# Patient Record
Sex: Female | Born: 1945
Health system: Southern US, Community
[De-identification: ages and names within clinical notes are randomized; demographics above are authoritative.]

## PROBLEM LIST (undated history)

## (undated) DIAGNOSIS — G5600 Carpal tunnel syndrome, unspecified upper limb: Secondary | ICD-10-CM

## (undated) DIAGNOSIS — R42 Dizziness and giddiness: Secondary | ICD-10-CM

## (undated) DIAGNOSIS — I959 Hypotension, unspecified: Secondary | ICD-10-CM

## (undated) DIAGNOSIS — D122 Benign neoplasm of ascending colon: Secondary | ICD-10-CM

## (undated) DIAGNOSIS — C4490 Unspecified malignant neoplasm of skin, unspecified: Secondary | ICD-10-CM

## (undated) DIAGNOSIS — G43909 Migraine, unspecified, not intractable, without status migrainosus: Secondary | ICD-10-CM

## (undated) DIAGNOSIS — R569 Unspecified convulsions: Secondary | ICD-10-CM

## (undated) DIAGNOSIS — K259 Gastric ulcer, unspecified as acute or chronic, without hemorrhage or perforation: Secondary | ICD-10-CM

## (undated) DIAGNOSIS — K219 Gastro-esophageal reflux disease without esophagitis: Secondary | ICD-10-CM

## (undated) DIAGNOSIS — I219 Acute myocardial infarction, unspecified: Secondary | ICD-10-CM

## (undated) DIAGNOSIS — K754 Autoimmune hepatitis: Secondary | ICD-10-CM

## (undated) DIAGNOSIS — I495 Sick sinus syndrome: Secondary | ICD-10-CM

## (undated) DIAGNOSIS — K7460 Unspecified cirrhosis of liver: Secondary | ICD-10-CM

## (undated) DIAGNOSIS — E039 Hypothyroidism, unspecified: Secondary | ICD-10-CM

## (undated) DIAGNOSIS — H409 Unspecified glaucoma: Secondary | ICD-10-CM

## (undated) DIAGNOSIS — F419 Anxiety disorder, unspecified: Secondary | ICD-10-CM

## (undated) DIAGNOSIS — Z950 Presence of cardiac pacemaker: Secondary | ICD-10-CM

## (undated) DIAGNOSIS — D709 Neutropenia, unspecified: Secondary | ICD-10-CM

## (undated) DIAGNOSIS — M810 Age-related osteoporosis without current pathological fracture: Secondary | ICD-10-CM

## (undated) DIAGNOSIS — F319 Bipolar disorder, unspecified: Secondary | ICD-10-CM

## (undated) DIAGNOSIS — K6289 Other specified diseases of anus and rectum: Secondary | ICD-10-CM

## (undated) DIAGNOSIS — L858 Other specified epidermal thickening: Secondary | ICD-10-CM

## (undated) DIAGNOSIS — I639 Cerebral infarction, unspecified: Secondary | ICD-10-CM

## (undated) DIAGNOSIS — M130 Polyarthritis, unspecified: Secondary | ICD-10-CM

## (undated) DIAGNOSIS — R600 Localized edema: Secondary | ICD-10-CM

## (undated) DIAGNOSIS — M159 Polyosteoarthritis, unspecified: Secondary | ICD-10-CM

## (undated) DIAGNOSIS — G2119 Other drug induced secondary parkinsonism: Secondary | ICD-10-CM

## (undated) DIAGNOSIS — I455 Other specified heart block: Secondary | ICD-10-CM

## (undated) DIAGNOSIS — D696 Thrombocytopenia, unspecified: Secondary | ICD-10-CM

## (undated) DIAGNOSIS — I519 Heart disease, unspecified: Secondary | ICD-10-CM

## (undated) DIAGNOSIS — F200 Paranoid schizophrenia: Secondary | ICD-10-CM

## (undated) DIAGNOSIS — Z862 Personal history of diseases of the blood and blood-forming organs and certain disorders involving the immune mechanism: Secondary | ICD-10-CM

## (undated) DIAGNOSIS — M797 Fibromyalgia: Secondary | ICD-10-CM

## (undated) DIAGNOSIS — I8500 Esophageal varices without bleeding: Secondary | ICD-10-CM

## (undated) HISTORY — DX: Other specified heart block: I45.5

## (undated) HISTORY — DX: Autoimmune hepatitis: K75.4

## (undated) HISTORY — DX: Carpal tunnel syndrome, unspecified upper limb: G56.00

## (undated) HISTORY — PX: CHOLECYSTECTOMY: SHX55

## (undated) HISTORY — DX: Acute myocardial infarction, unspecified: I21.9

## (undated) HISTORY — DX: Gastro-esophageal reflux disease without esophagitis: K21.9

## (undated) HISTORY — DX: Hypothyroidism, unspecified: E03.9

## (undated) HISTORY — DX: Other specified epidermal thickening: L85.8

## (undated) HISTORY — DX: Fibromyalgia: M79.7

## (undated) HISTORY — PX: TUBAL LIGATION: SHX77

## (undated) HISTORY — DX: Polyarthritis, unspecified: M13.0

## (undated) HISTORY — DX: Anxiety disorder, unspecified: F41.9

## (undated) HISTORY — DX: Hypotension, unspecified: I95.9

## (undated) HISTORY — PX: REFRACTIVE SURGERY: SHX103

## (undated) HISTORY — DX: Neutropenia, unspecified: D70.9

## (undated) HISTORY — DX: Heart disease, unspecified: I51.9

## (undated) HISTORY — DX: Unspecified glaucoma: H40.9

## (undated) HISTORY — DX: Paranoid schizophrenia: F20.0

## (undated) HISTORY — DX: Presence of cardiac pacemaker: Z95.0

## (undated) HISTORY — DX: Benign neoplasm of ascending colon: D12.2

## (undated) HISTORY — DX: Dizziness and giddiness: R42

## (undated) HISTORY — DX: Other drug induced secondary parkinsonism: G21.19

---

## 1977-01-25 HISTORY — PX: PARTIAL HYSTERECTOMY: SHX80

## 1983-01-26 HISTORY — PX: KNEE SURGERY: SHX244

## 2001-01-25 DIAGNOSIS — H409 Unspecified glaucoma: Secondary | ICD-10-CM

## 2001-01-25 HISTORY — DX: Unspecified glaucoma: H40.9

## 2006-01-25 HISTORY — PX: CATARACT EXTRACTION: SUR2

## 2009-11-17 ENCOUNTER — Ambulatory Visit: Payer: Self-pay | Admitting: Gastroenterology

## 2010-07-20 ENCOUNTER — Ambulatory Visit (INDEPENDENT_AMBULATORY_CARE_PROVIDER_SITE_OTHER): Payer: Self-pay | Admitting: Gastroenterology

## 2010-07-20 DIAGNOSIS — R945 Abnormal results of liver function studies: Secondary | ICD-10-CM

## 2010-07-20 DIAGNOSIS — B199 Unspecified viral hepatitis without hepatic coma: Secondary | ICD-10-CM

## 2012-01-26 HISTORY — PX: PACEMAKER INSERTION: SHX728

## 2012-03-25 DIAGNOSIS — F200 Paranoid schizophrenia: Secondary | ICD-10-CM | POA: Insufficient documentation

## 2013-02-13 DIAGNOSIS — R569 Unspecified convulsions: Secondary | ICD-10-CM | POA: Diagnosis not present

## 2013-02-21 ENCOUNTER — Emergency Department (HOSPITAL_COMMUNITY): Payer: Medicare Other

## 2013-02-21 ENCOUNTER — Encounter (HOSPITAL_COMMUNITY): Payer: Self-pay | Admitting: Emergency Medicine

## 2013-02-21 ENCOUNTER — Emergency Department (HOSPITAL_COMMUNITY)
Admission: EM | Admit: 2013-02-21 | Discharge: 2013-02-21 | Disposition: A | Discharge status: Home / Self Care | Payer: Medicare Other | Attending: Emergency Medicine | Admitting: Emergency Medicine

## 2013-02-21 DIAGNOSIS — R5381 Other malaise: Secondary | ICD-10-CM | POA: Diagnosis not present

## 2013-02-21 DIAGNOSIS — I2510 Atherosclerotic heart disease of native coronary artery without angina pectoris: Secondary | ICD-10-CM | POA: Diagnosis not present

## 2013-02-21 DIAGNOSIS — I69998 Other sequelae following unspecified cerebrovascular disease: Secondary | ICD-10-CM | POA: Diagnosis not present

## 2013-02-21 DIAGNOSIS — R5383 Other fatigue: Secondary | ICD-10-CM | POA: Diagnosis not present

## 2013-02-21 DIAGNOSIS — R55 Syncope and collapse: Secondary | ICD-10-CM | POA: Diagnosis not present

## 2013-02-21 DIAGNOSIS — F319 Bipolar disorder, unspecified: Secondary | ICD-10-CM | POA: Insufficient documentation

## 2013-02-21 DIAGNOSIS — R404 Transient alteration of awareness: Secondary | ICD-10-CM | POA: Diagnosis not present

## 2013-02-21 DIAGNOSIS — G40909 Epilepsy, unspecified, not intractable, without status epilepticus: Secondary | ICD-10-CM | POA: Insufficient documentation

## 2013-02-21 DIAGNOSIS — Z8659 Personal history of other mental and behavioral disorders: Secondary | ICD-10-CM | POA: Insufficient documentation

## 2013-02-21 DIAGNOSIS — R42 Dizziness and giddiness: Secondary | ICD-10-CM | POA: Insufficient documentation

## 2013-02-21 DIAGNOSIS — R110 Nausea: Secondary | ICD-10-CM | POA: Diagnosis not present

## 2013-02-21 DIAGNOSIS — Z950 Presence of cardiac pacemaker: Secondary | ICD-10-CM | POA: Insufficient documentation

## 2013-02-21 HISTORY — DX: Bipolar disorder, unspecified: F31.9

## 2013-02-21 HISTORY — DX: Cerebral infarction, unspecified: I63.9

## 2013-02-21 HISTORY — DX: Unspecified convulsions: R56.9

## 2013-02-21 LAB — URINALYSIS, ROUTINE W REFLEX MICROSCOPIC
Bilirubin Urine: NEGATIVE
Glucose, UA: NEGATIVE mg/dL
Ketones, ur: NEGATIVE mg/dL
Leukocytes, UA: NEGATIVE
Nitrite: NEGATIVE
Protein, ur: NEGATIVE mg/dL
Specific Gravity, Urine: 1.008 (ref 1.005–1.030)
Urobilinogen, UA: 0.2 mg/dL (ref 0.0–1.0)
pH: 7 (ref 5.0–8.0)

## 2013-02-21 LAB — DIFFERENTIAL
Basophils Absolute: 0 10*3/uL (ref 0.0–0.1)
Basophils Relative: 0 % (ref 0–1)
Eosinophils Absolute: 0 10*3/uL (ref 0.0–0.7)
Eosinophils Relative: 2 % (ref 0–5)
Lymphocytes Relative: 28 % (ref 12–46)
Lymphs Abs: 0.5 10*3/uL — ABNORMAL LOW (ref 0.7–4.0)
Monocytes Absolute: 0.2 10*3/uL (ref 0.1–1.0)
Monocytes Relative: 13 % — ABNORMAL HIGH (ref 3–12)
Neutro Abs: 1 10*3/uL — ABNORMAL LOW (ref 1.7–7.7)
Neutrophils Relative %: 58 % (ref 43–77)

## 2013-02-21 LAB — COMPREHENSIVE METABOLIC PANEL
ALT: 15 U/L (ref 0–35)
AST: 32 U/L (ref 0–37)
Albumin: 2.7 g/dL — ABNORMAL LOW (ref 3.5–5.2)
Alkaline Phosphatase: 71 U/L (ref 39–117)
BUN: 12 mg/dL (ref 6–23)
CO2: 21 mEq/L (ref 19–32)
Calcium: 8.4 mg/dL (ref 8.4–10.5)
Chloride: 103 mEq/L (ref 96–112)
Creatinine, Ser: 0.58 mg/dL (ref 0.50–1.10)
GFR calc Af Amer: >90 mL/min (ref >90)
GFR calc non Af Amer: >90 mL/min (ref >90)
Glucose, Bld: 107 mg/dL — ABNORMAL HIGH (ref 70–99)
Potassium: 3.8 mEq/L (ref 3.7–5.3)
Sodium: 135 mEq/L — ABNORMAL LOW (ref 137–147)
Total Bilirubin: 0.4 mg/dL (ref 0.3–1.2)
Total Protein: 7.7 g/dL (ref 6.0–8.3)

## 2013-02-21 LAB — POCT I-STAT, CHEM 8
BUN: 12 mg/dL (ref 6–23)
Calcium, Ion: 1.15 mmol/L (ref 1.13–1.30)
Chloride: 106 mEq/L (ref 96–112)
Creatinine, Ser: 0.7 mg/dL (ref 0.50–1.10)
Glucose, Bld: 104 mg/dL — ABNORMAL HIGH (ref 70–99)
HCT: 40 % (ref 36.0–46.0)
Hemoglobin: 13.6 g/dL (ref 12.0–15.0)
Potassium: 3.7 mEq/L (ref 3.7–5.3)
Sodium: 140 mEq/L (ref 137–147)
TCO2: 22 mmol/L (ref 0–100)

## 2013-02-21 LAB — URINE MICROSCOPIC-ADD ON

## 2013-02-21 LAB — CBC
HCT: 36.6 % (ref 36.0–46.0)
Hemoglobin: 12.8 g/dL (ref 12.0–15.0)
MCH: 32.1 pg (ref 26.0–34.0)
MCHC: 35 g/dL (ref 30.0–36.0)
MCV: 91.7 fL (ref 78.0–100.0)
Platelets: UNDETERMINED 10*3/uL (ref 150–400)
RBC: 3.99 MIL/uL (ref 3.87–5.11)
RDW: 15.3 % (ref 11.5–15.5)
WBC: 1.7 10*3/uL — ABNORMAL LOW (ref 4.0–10.5)

## 2013-02-21 LAB — RAPID URINE DRUG SCREEN, HOSP PERFORMED
Amphetamines: NOT DETECTED
Barbiturates: NOT DETECTED
Benzodiazepines: NOT DETECTED
Cocaine: NOT DETECTED
Opiates: NOT DETECTED
Tetrahydrocannabinol: NOT DETECTED

## 2013-02-21 LAB — ETHANOL: Alcohol, Ethyl (B): <11 mg/dL (ref 0–11)

## 2013-02-21 LAB — PROTIME-INR
INR: 1.15 (ref 0.00–1.49)
Prothrombin Time: 14.5 seconds (ref 11.6–15.2)

## 2013-02-21 LAB — APTT: aPTT: 33 seconds (ref 24–37)

## 2013-02-21 LAB — POCT I-STAT TROPONIN I: Troponin i, poc: 0 ng/mL (ref 0.00–0.08)

## 2013-02-21 LAB — TROPONIN I: Troponin I: <0.3 ng/mL (ref <0.30)

## 2013-02-21 IMAGING — CT CT HEAD W/O CM
2 series · 16 of 30 positions shown, 18 images · non-contrast
Comparison: Head CT [DATE].

CLINICAL DATA: Dizziness.  Near syncope.  Weakness and hypotension.

EXAM:
CT HEAD WITHOUT CONTRAST
TECHNIQUE: Contiguous axial images were obtained from the base of the skull
through the vertex without intravenous contrast.

[Series 2: head w/o · axial · non-contrast · 0.43mm/px · z∈[+58,+182]mm · 8 of 33 slices shown, 10 images]
[im 4/33  brain]
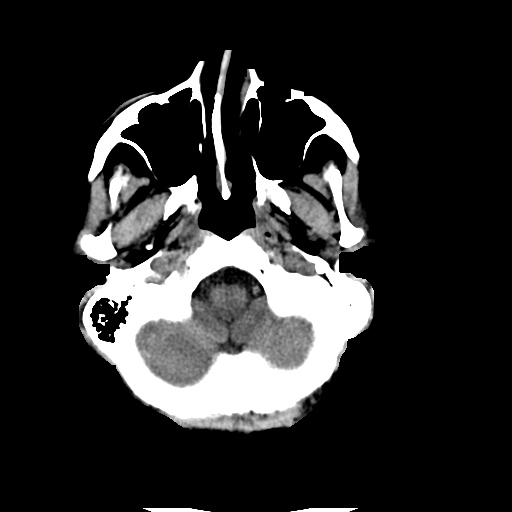
[im 4/33  bone]
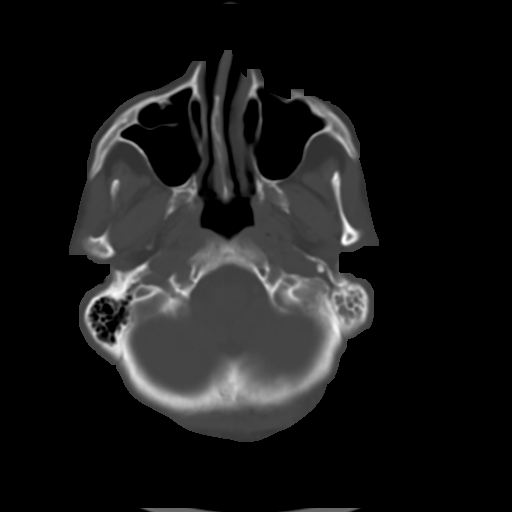
[im 8/33  brain]
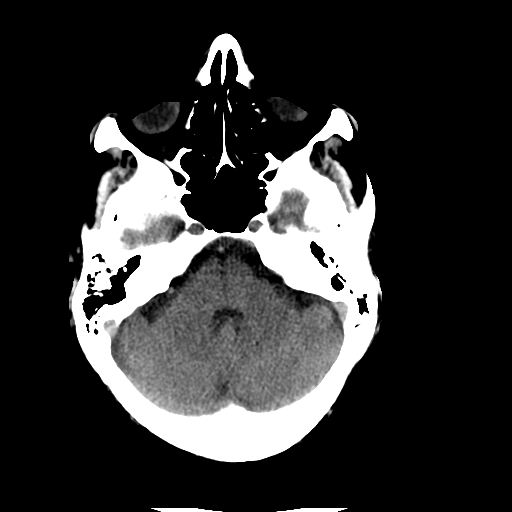
[im 11/33  brain]
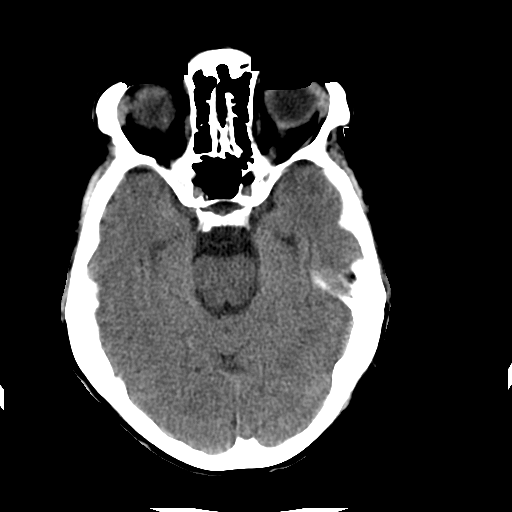
[im 15/33  brain]
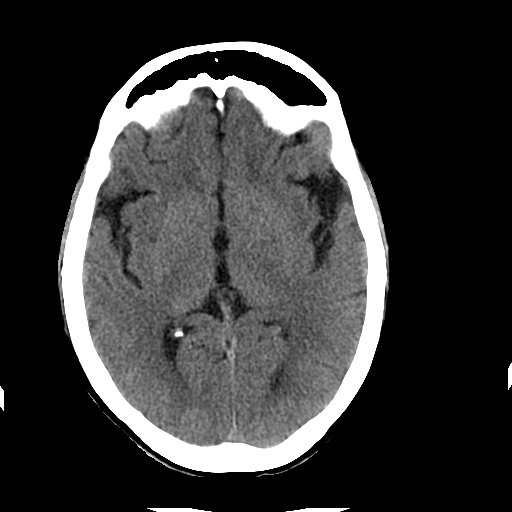
[im 18/33  brain]
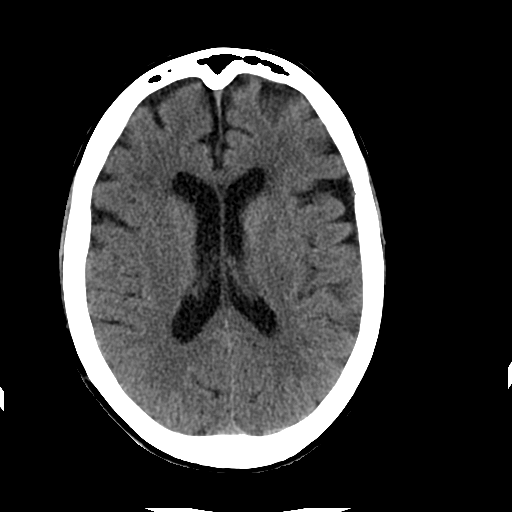
[im 18/33  bone]
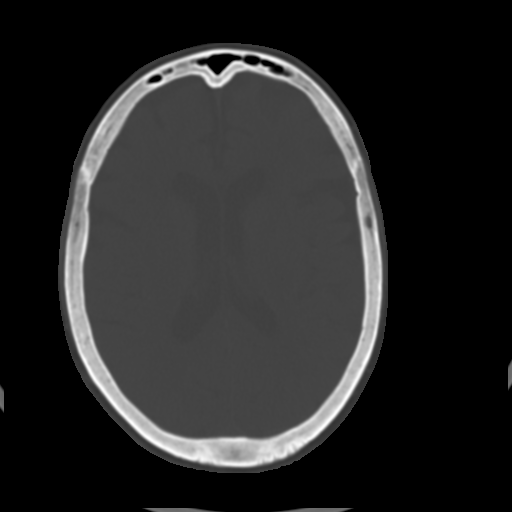
[im 22/33  brain]
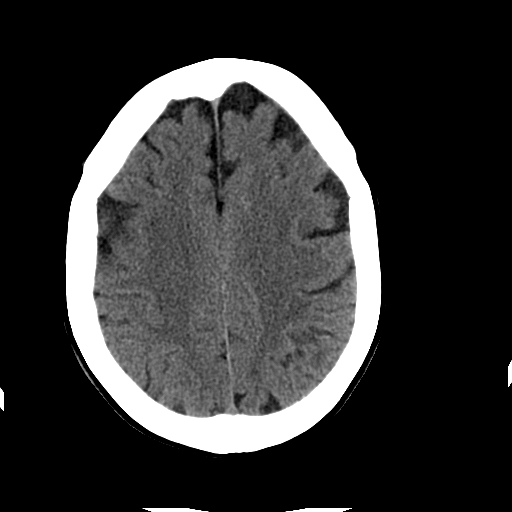
[im 25/33  brain]
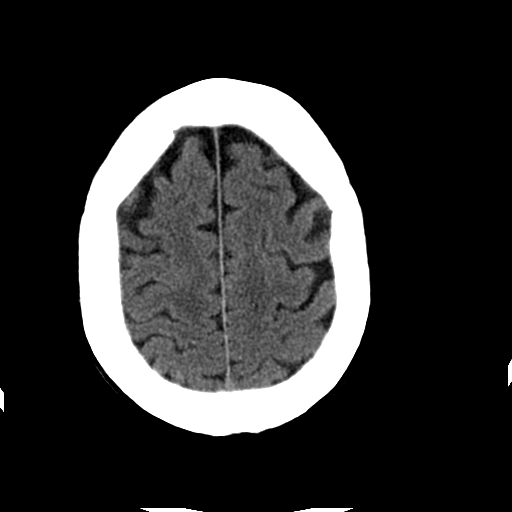
[im 29/33  brain]
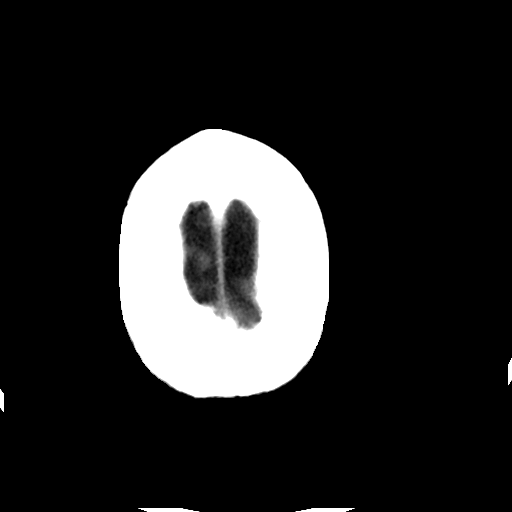

[Series 3: head w/o bone · axial · non-contrast · 0.43mm/px · z∈[+58,+185]mm · 8 of 65 slices shown]
[im 7/65  bone]
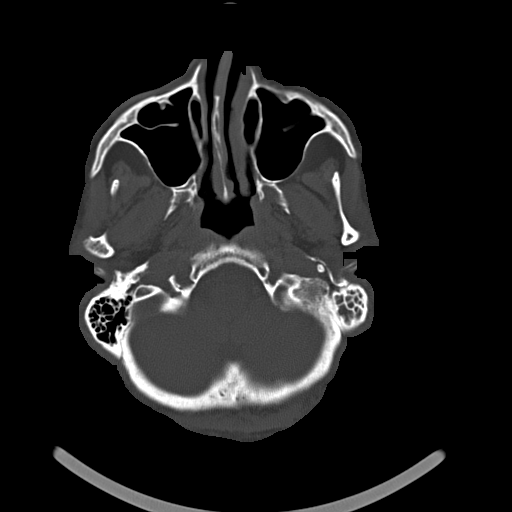
[im 14/65  bone]
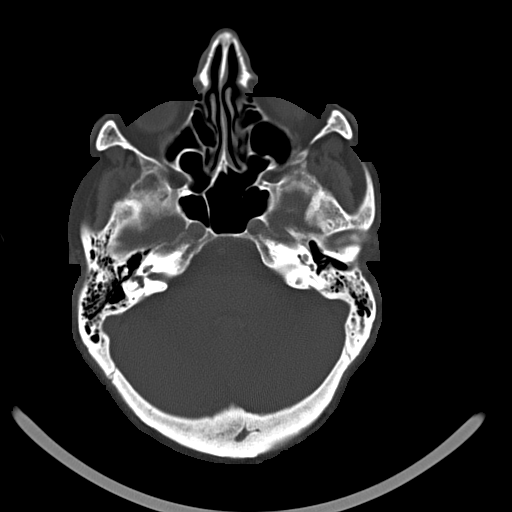
[im 21/65  bone]
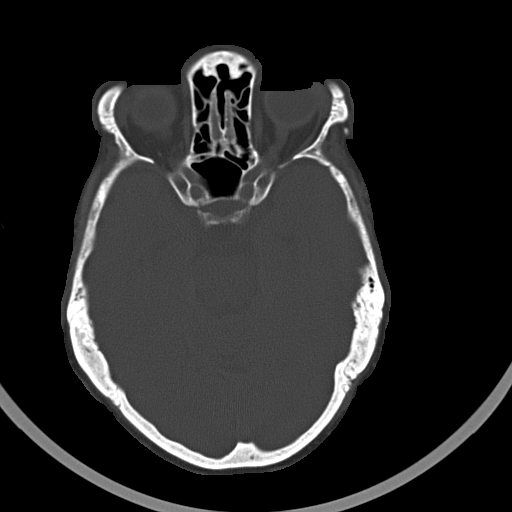
[im 27/65  bone]
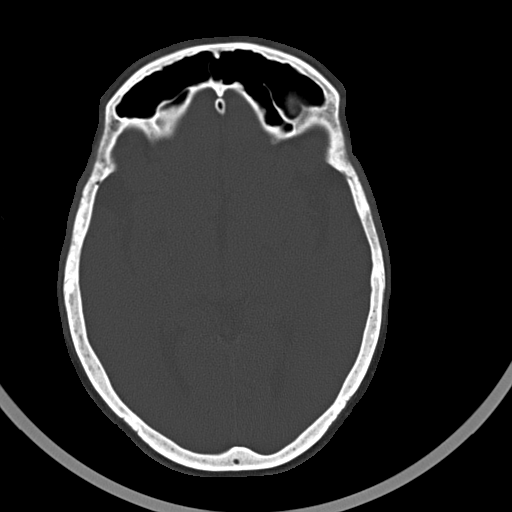
[im 38/65  bone]
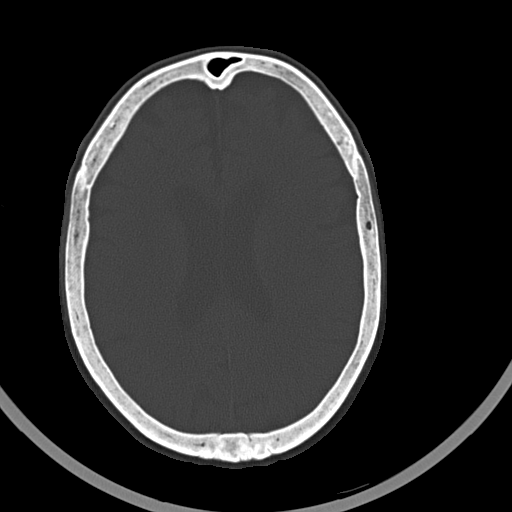
[im 44/65  bone]
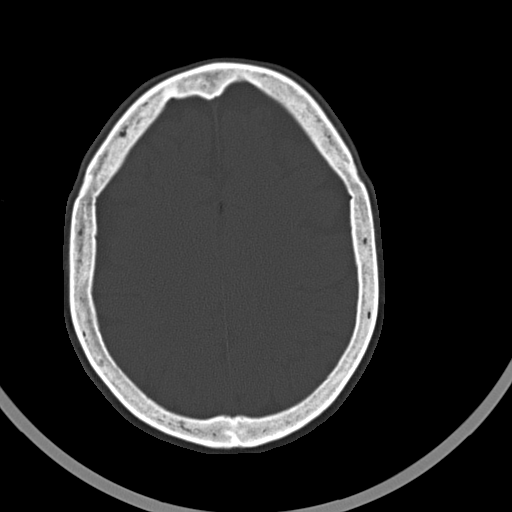
[im 51/65  bone]
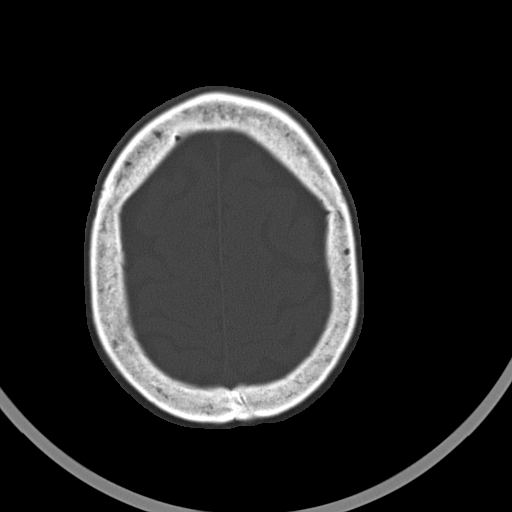
[im 58/65  bone]
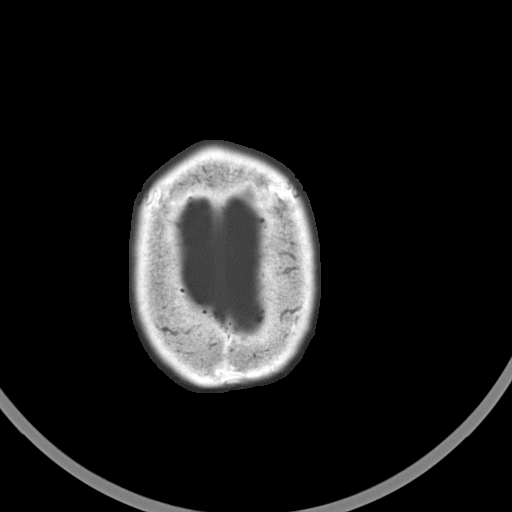

[16 of 30 positions shown; findings below may reference images not displayed]

FINDINGS: Mild cerebral atrophy. No acute intracranial abnormalities.
Specifically, no evidence of acute intracranial hemorrhage, no
definite findings of acute/subacute cerebral ischemia, no mass, mass
effect, hydrocephalus or abnormal intra or extra-axial fluid
collections. Large left mastoid effusion new compared to the prior
examination. The remainder the visualized paranasal sinuses and
mastoids are well pneumatized. No acute displaced skull fractures
are identified.
IMPRESSION: 1. New large left mastoid effusion compared to the prior
examination.
2. No acute intracranial abnormalities.
3. Mild cerebral atrophy.

## 2013-02-21 NOTE — Discharge Instructions (Signed)
Today you had a near syncopal episode. Your labwork and imaging today does not point to any one reason this happened. It is possible this is a side effect of the Imdur (isosorbide mononitrate). You may discontinue this medication at this time. It is very important that you follow up with a cardiologist. You have been referred to one in the area above. It is also very important that you return to the Emergency Department immediately if this happens again, you develop chest pain, shortness of breath, or have any symptoms that concern you.   Near-Syncope Near-syncope (commonly known as near fainting) is sudden weakness, dizziness, or feeling like you might pass out. During an episode of near-syncope, you may also develop pale skin, have tunnel vision, or feel sick to your stomach (nauseous). Near-syncope may occur when getting up after sitting or while standing for a long time. It is caused by a sudden decrease in blood flow to the brain. This decrease can result from various causes or triggers, most of which are not serious. However, because near-syncope can sometimes be a sign of something serious, a medical evaluation is required. The specific cause is often not determined. HOME CARE INSTRUCTIONS  Monitor your condition for any changes. The following actions may help to alleviate any discomfort you are experiencing:  Have someone stay with you until you feel stable.  Lie down right away if you start feeling like you might faint. Breathe deeply and steadily. Wait until all the symptoms have passed. Most of these episodes last only a few minutes. You may feel tired for several hours.   Drink enough fluids to keep your urine clear or pale yellow.   If you are taking blood pressure or heart medicine, get up slowly when seated or lying down. Take several minutes to sit and then stand. This can reduce dizziness.  Follow up with your health care provider as directed. SEEK IMMEDIATE MEDICAL CARE IF:    You have a severe headache.   You have unusual pain in the chest, abdomen, or back.   You are bleeding from the mouth or rectum, or you have black or tarry stool.   You have an irregular or very fast heartbeat.   You have repeated fainting or have seizure-like jerking during an episode.   You faint when sitting or lying down.   You have confusion.   You have difficulty walking.   You have severe weakness.   You have vision problems.  MAKE SURE YOU:   Understand these instructions.  Will watch your condition.  Will get help right away if you are not doing well or get worse. Document Released: 01/11/2005 Document Revised: 09/13/2012 Document Reviewed: 06/16/2012 [facility] Patient Information 2014 [employer].

## 2013-02-21 NOTE — ED Notes (Signed)
C/o dizziness, lightheadedness, n/v, generalized weakness & near syncopal. Denies dizziness when laying still & describes it as the room spinning. Denies fever, cold, cough, vomiting, diarrhea. States has been eating & drinking well. Denies any CP, SOB, palpitations.

## 2013-02-21 NOTE — ED Notes (Signed)
After breakfast & taking meds C/o sudden onset dizziness, nausea, weakness & near syncopal. Per EMS - pt orthostatic. BP lying 112/60 to sitting 80/60. Given 249ml NS

## 2013-02-21 NOTE — ED Notes (Signed)
Medtronic pacemaker interrogated °

## 2013-02-21 NOTE — ED Notes (Signed)
Ambulated from the bathroom to her room pt amublated without any assistance no complaint of dizziness headache or weakness

## 2013-02-21 NOTE — ED Notes (Signed)
Patient transported to CT 

## 2013-02-21 NOTE — ED Provider Notes (Signed)
CSN: [id number]     Arrival date & time 02/21/13  1030 History   First MD Initiated Contact with Patient 02/21/13 1032     Chief Complaint  Patient presents with  . Near Syncope  . Dizziness  . Weakness  . Hypotension   (Consider location/radiation/quality/duration/timing/severity/associated sxs/prior Treatment) HPI Comments: Patient is a 68 year old female with history of coronary artery disease, stroke, seizures, bipolar disorder who presents today with sudden onset of dizziness, nausea, weakness at 9 AM. She reports that she was eating breakfast and got up after taking her medication. If she felt as though the room is spinning around her. She felt generally weak and nauseous. She felt as though she was going to pass out. She reports that this feels like when she is about to have a seizure. She takes Keppra for seizures and has been taking it as prescribed. She denies any recent illness, but seems to have runny noses off and on. She denies any new medications recently. She reports that this does not feel like her prior stroke. She has residual left-sided weakness from her prior stroke, but feels that it is worse today. Her symptoms are worse when she sits up. He denies chest pain, shortness of breath, fever, chills, nausea, vomiting abdominal pain, urinary urgency, urinary frequency, dysuria. She was seen by her cardiologist earlier this month. She just moved her from [place] to [place].  The history is provided by the patient. No language interpreter was used.    No past medical history on file. No past surgical history on file. No family history on file. History  Substance Use Topics  . Smoking status: Not on file  . Smokeless tobacco: Not on file  . Alcohol Use: Not on file   OB History   No data available     Review of Systems  Constitutional: Negative for fever and chills.  HENT: Negative for ear pain.   Respiratory: Negative for shortness of breath.   Cardiovascular:  Negative for chest pain.  Gastrointestinal: Positive for nausea. Negative for vomiting and abdominal pain.  Neurological: Positive for dizziness and light-headedness. Negative for seizures and headaches.  All other systems reviewed and are negative.    Allergies  Review of patient's allergies indicates not on file.  Home Medications  No current outpatient prescriptions on file. Ht 5\' 8"  (1.727 m)  Wt 168 lb (76.204 kg)  BMI 25.55 kg/m2  SpO2 97% Physical Exam  Nursing note and vitals reviewed. Constitutional: She is oriented to person, place, and time. She appears well-developed and well-nourished. No distress.  HENT:  Head: Normocephalic and atraumatic.  Right Ear: Tympanic membrane, external ear and ear canal normal. No mastoid tenderness.  Left Ear: Tympanic membrane, external ear and ear canal normal. No mastoid tenderness.  Nose: Nose normal.  Mouth/Throat: Uvula is midline, oropharynx is clear and moist and mucous membranes are normal.  Eyes: Conjunctivae and EOM are normal. Pupils are equal, round, and reactive to light.  Neck: Normal range of motion.  Cardiovascular: Normal rate, regular rhythm, normal heart sounds, intact distal pulses and normal pulses.   Pulmonary/Chest: Effort normal and breath sounds normal. No stridor. No respiratory distress. She has no wheezes. She has no rales.  Pacemaker in place in left chest  Abdominal: Soft. She exhibits no distension. There is tenderness in the suprapubic area. There is no rigidity, no rebound and no guarding.  Musculoskeletal: Normal range of motion.  Neurological: She is alert and oriented to person, place, and time.  She has normal strength. Coordination normal.  Finger nose finger normal. Rapid alternating movements normal. No pronator drift. No facial droop.  4/5 strength on left lower extremity. Pt reports this is chronic from prior stroke.   Skin: Skin is warm and dry. She is not diaphoretic. No erythema.  Psychiatric:  She has a normal mood and affect. Her behavior is normal.    ED Course  Procedures (including critical care time) Labs Review Labs Reviewed  CBC - Abnormal; Notable for the following:    WBC 1.7 (*)    All other components within normal limits  DIFFERENTIAL - Abnormal; Notable for the following:    Neutro Abs 1.0 (*)    Lymphs Abs 0.5 (*)    Monocytes Relative 13 (*)    All other components within normal limits  COMPREHENSIVE METABOLIC PANEL - Abnormal; Notable for the following:    Sodium 135 (*)    Glucose, Bld 107 (*)    Albumin 2.7 (*)    All other components within normal limits  URINALYSIS, ROUTINE W REFLEX MICROSCOPIC - Abnormal; Notable for the following:    Hgb urine dipstick SMALL (*)    All other components within normal limits  POCT I-STAT, CHEM 8 - Abnormal; Notable for the following:    Glucose, Bld 104 (*)    All other components within normal limits  ETHANOL  PROTIME-INR  APTT  TROPONIN I  URINE RAPID DRUG SCREEN (HOSP PERFORMED)  URINE MICROSCOPIC-ADD ON  POCT I-STAT TROPONIN I   Imaging Review Ct Head Wo Contrast  02/21/2013   CLINICAL DATA:  Dizziness.  Near syncope.  Weakness and hypotension.  EXAM: CT HEAD WITHOUT CONTRAST  TECHNIQUE: Contiguous axial images were obtained from the base of the skull through the vertex without intravenous contrast.  COMPARISON:  Head CT 01/10/2006.  FINDINGS: Mild cerebral atrophy. No acute intracranial abnormalities. Specifically, no evidence of acute intracranial hemorrhage, no definite findings of acute/subacute cerebral ischemia, no mass, mass effect, hydrocephalus or abnormal intra or extra-axial fluid collections. Large left mastoid effusion new compared to the prior examination. The remainder the visualized paranasal sinuses and mastoids are well pneumatized. No acute displaced skull fractures are identified.  IMPRESSION: 1. New large left mastoid effusion compared to the prior examination. 2. No acute intracranial  abnormalities. 3. Mild cerebral atrophy.   Electronically Signed   By: Vinnie Langton M.D.   On: 02/21/2013 12:28    EKG Interpretation   None      11:26 AM Called Dr. Adela Glimpse about patient. His secretary reports he is retired. Will attempts to contact patient's PCP.   11:28 AM Sherren Mocha contacted me. He has not seen pt since June of 2014. Reviewed notes from July 2014. She has been living in [place] and had been seeing here for awhile. He reports lots of mental illness. Patient has been a poor historian for him as she has been for me. She is seen at [facility] for autoimmune hepatitis. She is seen by Dr. Monica Martinez. He does not recall any significant weakness from her prior stroke. He does report frequent delusions, paranoia. She believes electronics are controlling things in her apt. He is unsure of where she was seen for her pacemaker placement in December.   I left a message for patient's cardiologist. I did not receive a call back.   Patient ambulated with steady gait to the bathroom. Patient reports this did not elicit any further sx.   MDM   1. Near syncope  Patient feels significantly improved prior to discharge. She is able to ambulate with a steady gait to the bathroom. This does not elicit any further symptoms. Lab work and imaging is unremarkable. Patient will followup with a cardiologist. At this point the patient was injected to discontinue her Imdur. Strict return instructions to return to the emergency department immediately with any development of chest pain, shortness of breath, lightheadedness. She will follow up with a cardiologist in [place]. Dr. Jeneen Rinks evaluated this patient and agrees with plan. Vital signs stable for discharge. Patient / Family / Caregiver informed of clinical course, understand medical decision-making process, and agree with plan.     Elwyn Lade, PA-C 02/22/13 [phone]

## 2013-02-21 NOTE — ED Notes (Signed)
Did in and out cath on patient dark yellow urine in return 

## 2013-02-21 NOTE — ED Notes (Signed)
C/o dizziness upon sitting while obtaining orthostatic VS. Pt not stood up

## 2013-02-23 NOTE — ED Provider Notes (Signed)
Medical screening examination/treatment/procedure(s) were conducted as a shared visit with non-physician practitioner(s) and myself.  I personally evaluated the patient during the encounter.  EKG Interpretation    Date/Time:  Wednesday February 21 2013 10:49:16 EST Ventricular Rate:  76 PR Interval:  187 QRS Duration: 93 QT Interval:  402 QTC Calculation: 452 R Axis:   -2 Text Interpretation:  Sinus rhythm Low voltage, extremity and precordial leads Consider inferior infarct Consider anterior infarct ED PHYSICIAN INTERPRETATION AVAILABLE IN [facility] Confirmed by TEST, RECORD ([id number]) on 02/23/2013 8:44:36 AM            The patient seen examined. I reviewed the patient's history with her. I also discussed her history and presentation with Hardie Lora, PA. She is orthostatic. After IV fluid she is not. No events were found on interrogation of her pacemaker. Resting EKG shows no concerning findings. Labs are reassuring. Degrees she is appropriate for outpatient treatment. We'll hold her isosorbide. Check blood pressures at home. Cardiologist followup. Recheck if any recurrence of symptoms.  Tanna Furry, MD 02/23/13 [phone]

## 2013-03-06 DIAGNOSIS — F3110 Bipolar disorder, current episode manic without psychotic features, unspecified: Secondary | ICD-10-CM | POA: Diagnosis not present

## 2013-03-06 DIAGNOSIS — F319 Bipolar disorder, unspecified: Secondary | ICD-10-CM | POA: Diagnosis not present

## 2013-03-08 ENCOUNTER — Ambulatory Visit: Payer: Medicare Other | Admitting: Psychology

## 2013-03-16 ENCOUNTER — Encounter: Payer: Medicare Other | Admitting: Internal Medicine

## 2013-03-27 ENCOUNTER — Encounter: Payer: Self-pay | Admitting: *Deleted

## 2013-03-29 ENCOUNTER — Ambulatory Visit (INDEPENDENT_AMBULATORY_CARE_PROVIDER_SITE_OTHER): Payer: Medicare Other | Admitting: Internal Medicine

## 2013-03-29 ENCOUNTER — Encounter: Payer: Medicare Other | Admitting: Internal Medicine

## 2013-03-29 ENCOUNTER — Other Ambulatory Visit: Payer: Self-pay

## 2013-03-29 ENCOUNTER — Encounter: Payer: Self-pay | Admitting: Internal Medicine

## 2013-03-29 VITALS — BP 129/99 | HR 76 | Ht 68.5 in | Wt 173.0 lb

## 2013-03-29 DIAGNOSIS — R55 Syncope and collapse: Secondary | ICD-10-CM | POA: Diagnosis not present

## 2013-03-29 DIAGNOSIS — I959 Hypotension, unspecified: Secondary | ICD-10-CM

## 2013-03-29 DIAGNOSIS — Z950 Presence of cardiac pacemaker: Secondary | ICD-10-CM | POA: Diagnosis not present

## 2013-03-29 DIAGNOSIS — I495 Sick sinus syndrome: Secondary | ICD-10-CM

## 2013-03-29 DIAGNOSIS — I455 Other specified heart block: Secondary | ICD-10-CM

## 2013-03-29 LAB — MDC_IDC_ENUM_SESS_TYPE_INCLINIC
Battery Remaining Longevity: 134 mo
Battery Voltage: 3.04 V
Brady Statistic AP VP Percent: 0.04 %
Brady Statistic AP VS Percent: 17.82 %
Brady Statistic AS VP Percent: 1.3 %
Brady Statistic AS VS Percent: 80.84 %
Brady Statistic RA Percent Paced: 17.86 %
Brady Statistic RV Percent Paced: 1.34 %
Date Time Interrogation Session: 20150305173204
Lead Channel Impedance Value: 323 Ohm
Lead Channel Impedance Value: 399 Ohm
Lead Channel Impedance Value: 418 Ohm
Lead Channel Impedance Value: 494 Ohm
Lead Channel Pacing Threshold Amplitude: 0.5 V
Lead Channel Pacing Threshold Amplitude: 0.5 V
Lead Channel Pacing Threshold Pulse Width: 0.4 ms
Lead Channel Pacing Threshold Pulse Width: 0.4 ms
Lead Channel Sensing Intrinsic Amplitude: 2.25 mV
Lead Channel Sensing Intrinsic Amplitude: 2.375 mV
Lead Channel Sensing Intrinsic Amplitude: 4 mV
Lead Channel Sensing Intrinsic Amplitude: 5.25 mV
Lead Channel Setting Pacing Amplitude: 1.5 V
Lead Channel Setting Pacing Amplitude: 2 V
Lead Channel Setting Pacing Pulse Width: 0.4 ms
Lead Channel Setting Sensing Sensitivity: 0.6 mV
Zone Setting Detection Interval: 350 ms
Zone Setting Detection Interval: 400 ms

## 2013-03-29 NOTE — Patient Instructions (Addendum)
Your physician has recommended you make the following change in your medication:   STOP TAKING: aspirin & Isosorbide mononitrate  Your physician wants you to follow-up in: 9 months with Dr. Caryl Comes.  You will receive a reminder letter in the mail two months in advance. If you don't receive a letter, please call our office to schedule the follow-up appointment.  Please send a home monitoring transmission on June 28, 2013

## 2013-03-29 NOTE — Progress Notes (Signed)
Patient Care Team: Elizabeth Palau, MD as PCP - General (Family Medicine)   HPI  Stacey Velazquez is a 68 y.o. female Seen to establish pacemaker followup. This was implanted 12/14 for sinus arrest and syncope. It was done in [place]. We do not have records. She does not recall any specific cardiac evaluation.  She has been on nitrates and aspirin for many years; this was started by a family practitioner. She does not recall cardiac evaluation at that time either.  She lives in a group home. She has 2 daughters.   She has a history of presyncope for which she was seen at the [facility] emergency room. She was noted to be hypotensive with blood pressures in the 80s-110s   Past Medical History  Diagnosis Date  . Seizures   . Bipolar 1 disorder   . Sinus arrest   . Pacemaker Medtronic     MRI compatible  . Hypotension     Past Surgical History  Procedure Laterality Date  . Pacemaker insertion      Current Outpatient Prescriptions  Medication Sig Dispense Refill  . azaTHIOprine (IMURAN) 50 MG tablet Take 100 mg by mouth daily.      . Cholecalciferol (VITAMIN D) 1000 UNITS capsule Take 1,000 Units by mouth daily.      Marland Kitchen ketoprofen (ORUDIS) 75 MG capsule Take 75 mg by mouth 3 (three) times daily as needed (for headache).      . Lactobacillus (LACTINEX) PACK Take 1 each by mouth daily as needed (for lactose intolerance).       Marland Kitchen levETIRAcetam (KEPPRA) 250 MG tablet Take 250 mg by mouth 2 (two) times daily.      Marland Kitchen levothyroxine (SYNTHROID, LEVOTHROID) 100 MCG tablet Take 100 mcg by mouth daily before breakfast.      . risperiDONE (RISPERDAL) 3 MG tablet Take 3 mg by mouth at bedtime.       No current facility-administered medications for this visit.    Allergies  Allergen Reactions  . Ambien [Zolpidem Tartrate] Other (See Comments)    Reaction unknown  . Amitriptyline Other (See Comments)    Reaction unknown  . Ampicillin Other (See Comments)    Reaction unknown  .  Anaprox [Naproxen Sodium] Other (See Comments)    Reaction unknown  . Benadryl [Diphenhydramine Hcl (Sleep)] Other (See Comments)    Reaction unknown  . Cetirizine & Related Other (See Comments)    Reaction unknown  . Cortizone-10 [Hydrocortisone] Other (See Comments)    Reaction unknown  . Darvon [Propoxyphene] Other (See Comments)    Reaction unknown  . Diazepam Other (See Comments)    Reaction unknown  . Diflunisal Other (See Comments)    Reaction unknown  . Duloxetine Other (See Comments)    Reaction unknown  . Flexeril [Cyclobenzaprine] Other (See Comments)    Reaction unknown  . Lidocaine Other (See Comments)    Reaction unknown  . Meperidine And Related Other (See Comments)    Reaction unknown  . Metanx [L-Methylfolate-Algae-B12-B6] Other (See Comments)    Reaction unknown  . Metoclopramide Other (See Comments)    Reaction unknown  . Morphine And Related Other (See Comments)    Reaction unknown  . Nuprin [Ibuprofen] Other (See Comments)    Reaction unknown  . Oxycodone Other (See Comments)    Reaction unknown  . Penicillins Other (See Comments)    Reaction unknown  . Propoxyphene And Methadone Other (See Comments)    Reaction unknown  .  Ranitidine Other (See Comments)    Reaction unknown  . Tylenol [Acetaminophen] Other (See Comments)    Reaction unknown  . Vicodin [Hydrocodone-Acetaminophen] Other (See Comments)    Reaction unknown    Review of Systems negative except from HPI and PMH  Physical Exam BP 129/99  Pulse 76  Ht 5' 8.5" (1.74 m)  Wt 173 lb (78.472 kg)  BMI 25.92 kg/m2  SpO2 99% Well developed and well nourished in no acute distress HENT normal E scleral and icterus clear Neck Supple JVP flat; carotids brisk and full Clear to ausculation Device pocket well healed; without hematoma or erythema.  There is no tethering Regular rate and rhythm, no murmurs gallops or rub Soft with active bowel sounds No clubbing cyanosis none Edema Alert and  oriented, grossly normal motor and sensory function Skin Warm and Dry    Assessment and  Plan  Sinus node dysfunction  Pacemaker-Medtronic  Hypotension   Given her low blood pressures, we'll discontinue her Imdur or as she has no known coronary disease. We'll also stop her aspirin for the same reason. Pacemaker function is stable and device was reprogrammed to maximize longevity. She is atrially pacing about 17% of the time.

## 2013-04-30 ENCOUNTER — Telehealth: Payer: Self-pay | Admitting: Internal Medicine

## 2013-04-30 NOTE — Telephone Encounter (Signed)
Transmitter ordered 04/30/13. Pt knows it should arrive in about two weeks or less. Pt aware of first transmission on 06/28/13.

## 2013-04-30 NOTE — Telephone Encounter (Signed)
New message     When pt was here 3-5 and saw Dr Caryl Comes, the medtronic man said he would send her a box to check her device remotely.  She has not received the box.

## 2013-05-10 DIAGNOSIS — Q245 Malformation of coronary vessels: Secondary | ICD-10-CM | POA: Diagnosis not present

## 2013-05-18 DIAGNOSIS — E039 Hypothyroidism, unspecified: Secondary | ICD-10-CM | POA: Diagnosis not present

## 2013-05-18 DIAGNOSIS — Q245 Malformation of coronary vessels: Secondary | ICD-10-CM | POA: Diagnosis not present

## 2013-05-18 DIAGNOSIS — R569 Unspecified convulsions: Secondary | ICD-10-CM | POA: Diagnosis not present

## 2013-05-18 DIAGNOSIS — Z79899 Other long term (current) drug therapy: Secondary | ICD-10-CM | POA: Diagnosis not present

## 2013-05-21 DIAGNOSIS — F319 Bipolar disorder, unspecified: Secondary | ICD-10-CM | POA: Diagnosis not present

## 2013-06-19 ENCOUNTER — Telehealth: Payer: Self-pay | Admitting: Internal Medicine

## 2013-06-19 NOTE — Telephone Encounter (Signed)
CALLED PATIENT TO SCHEDULE NP APP NO ANSWER.

## 2013-06-20 ENCOUNTER — Telehealth: Payer: Self-pay | Admitting: Internal Medicine

## 2013-06-20 NOTE — Telephone Encounter (Signed)
CALLED PATIENT TO SCHEDULE NP APPT NO ANSWER OR VM WILL CALL BACK.

## 2013-06-28 ENCOUNTER — Ambulatory Visit (INDEPENDENT_AMBULATORY_CARE_PROVIDER_SITE_OTHER): Payer: Medicare Other | Admitting: *Deleted

## 2013-06-28 ENCOUNTER — Encounter: Payer: Self-pay | Admitting: Internal Medicine

## 2013-06-28 DIAGNOSIS — I495 Sick sinus syndrome: Secondary | ICD-10-CM

## 2013-06-28 LAB — MDC_IDC_ENUM_SESS_TYPE_REMOTE
Battery Remaining Longevity: 122 mo
Battery Voltage: 3.03 V
Brady Statistic AP VP Percent: 0.02 %
Brady Statistic AP VS Percent: 16.53 %
Brady Statistic AS VP Percent: 0.03 %
Brady Statistic AS VS Percent: 83.43 %
Brady Statistic RA Percent Paced: 16.54 %
Brady Statistic RV Percent Paced: 0.05 %
Date Time Interrogation Session: 20150604184355
Lead Channel Impedance Value: 323 Ohm
Lead Channel Impedance Value: 418 Ohm
Lead Channel Impedance Value: 437 Ohm
Lead Channel Impedance Value: 532 Ohm
Lead Channel Pacing Threshold Amplitude: 0.5 V
Lead Channel Pacing Threshold Amplitude: 0.625 V
Lead Channel Pacing Threshold Pulse Width: 0.4 ms
Lead Channel Pacing Threshold Pulse Width: 0.4 ms
Lead Channel Sensing Intrinsic Amplitude: 1.875 mV
Lead Channel Sensing Intrinsic Amplitude: 1.875 mV
Lead Channel Sensing Intrinsic Amplitude: 4.25 mV
Lead Channel Sensing Intrinsic Amplitude: 4.25 mV
Lead Channel Setting Pacing Amplitude: 1.5 V
Lead Channel Setting Pacing Amplitude: 2 V
Lead Channel Setting Pacing Pulse Width: 0.4 ms
Lead Channel Setting Sensing Sensitivity: 0.6 mV
Zone Setting Detection Interval: 350 ms
Zone Setting Detection Interval: 400 ms

## 2013-06-29 NOTE — Progress Notes (Signed)
Remote pacemaker transmission.   

## 2013-07-12 DIAGNOSIS — D72819 Decreased white blood cell count, unspecified: Secondary | ICD-10-CM | POA: Diagnosis not present

## 2013-07-25 ENCOUNTER — Encounter: Payer: Self-pay | Admitting: Cardiology

## 2013-08-22 DIAGNOSIS — F319 Bipolar disorder, unspecified: Secondary | ICD-10-CM | POA: Diagnosis not present

## 2013-10-02 ENCOUNTER — Telehealth: Payer: Self-pay | Admitting: Cardiology

## 2013-10-02 ENCOUNTER — Ambulatory Visit (INDEPENDENT_AMBULATORY_CARE_PROVIDER_SITE_OTHER): Payer: Medicare Other | Admitting: *Deleted

## 2013-10-02 DIAGNOSIS — I495 Sick sinus syndrome: Secondary | ICD-10-CM

## 2013-10-02 NOTE — Progress Notes (Signed)
Remote pacemaker transmission.   

## 2013-10-02 NOTE — Telephone Encounter (Signed)
Left message with caregiver to confirm remote transmission for pt.

## 2013-10-04 LAB — MDC_IDC_ENUM_SESS_TYPE_REMOTE
Battery Remaining Longevity: 114 mo
Battery Voltage: 3.02 V
Brady Statistic AP VP Percent: 0.01 %
Brady Statistic AP VS Percent: 7.53 %
Brady Statistic AS VP Percent: 0.04 %
Brady Statistic AS VS Percent: 92.42 %
Brady Statistic RA Percent Paced: 7.54 %
Brady Statistic RV Percent Paced: 0.05 %
Date Time Interrogation Session: 20150908193026
Lead Channel Impedance Value: 323 Ohm
Lead Channel Impedance Value: 437 Ohm
Lead Channel Impedance Value: 456 Ohm
Lead Channel Impedance Value: 513 Ohm
Lead Channel Pacing Threshold Amplitude: 0.375 V
Lead Channel Pacing Threshold Amplitude: 0.625 V
Lead Channel Pacing Threshold Pulse Width: 0.4 ms
Lead Channel Pacing Threshold Pulse Width: 0.4 ms
Lead Channel Sensing Intrinsic Amplitude: 1.375 mV
Lead Channel Sensing Intrinsic Amplitude: 1.375 mV
Lead Channel Sensing Intrinsic Amplitude: 3.625 mV
Lead Channel Sensing Intrinsic Amplitude: 3.625 mV
Lead Channel Setting Pacing Amplitude: 1.5 V
Lead Channel Setting Pacing Amplitude: 2 V
Lead Channel Setting Pacing Pulse Width: 0.4 ms
Lead Channel Setting Sensing Sensitivity: 0.6 mV
Zone Setting Detection Interval: 350 ms
Zone Setting Detection Interval: 400 ms

## 2013-10-24 DIAGNOSIS — Z23 Encounter for immunization: Secondary | ICD-10-CM | POA: Diagnosis not present

## 2013-10-29 ENCOUNTER — Encounter: Payer: Self-pay | Admitting: Cardiology

## 2013-10-31 ENCOUNTER — Emergency Department (HOSPITAL_COMMUNITY)
Admission: EM | Admit: 2013-10-31 | Discharge: 2013-10-31 | Disposition: A | Discharge status: Home / Self Care | Payer: Medicare Other | Attending: Emergency Medicine | Admitting: Emergency Medicine

## 2013-10-31 ENCOUNTER — Emergency Department (HOSPITAL_COMMUNITY): Payer: Medicare Other

## 2013-10-31 ENCOUNTER — Encounter (HOSPITAL_COMMUNITY): Payer: Self-pay | Admitting: Emergency Medicine

## 2013-10-31 DIAGNOSIS — Z79899 Other long term (current) drug therapy: Secondary | ICD-10-CM | POA: Insufficient documentation

## 2013-10-31 DIAGNOSIS — Z8719 Personal history of other diseases of the digestive system: Secondary | ICD-10-CM | POA: Insufficient documentation

## 2013-10-31 DIAGNOSIS — M542 Cervicalgia: Secondary | ICD-10-CM | POA: Diagnosis not present

## 2013-10-31 DIAGNOSIS — R531 Weakness: Secondary | ICD-10-CM | POA: Diagnosis not present

## 2013-10-31 DIAGNOSIS — R911 Solitary pulmonary nodule: Secondary | ICD-10-CM | POA: Diagnosis not present

## 2013-10-31 DIAGNOSIS — R42 Dizziness and giddiness: Secondary | ICD-10-CM | POA: Diagnosis not present

## 2013-10-31 DIAGNOSIS — E039 Hypothyroidism, unspecified: Secondary | ICD-10-CM | POA: Insufficient documentation

## 2013-10-31 DIAGNOSIS — R519 Headache, unspecified: Secondary | ICD-10-CM

## 2013-10-31 DIAGNOSIS — Z950 Presence of cardiac pacemaker: Secondary | ICD-10-CM | POA: Insufficient documentation

## 2013-10-31 DIAGNOSIS — R51 Headache: Secondary | ICD-10-CM | POA: Insufficient documentation

## 2013-10-31 DIAGNOSIS — I6523 Occlusion and stenosis of bilateral carotid arteries: Secondary | ICD-10-CM | POA: Diagnosis not present

## 2013-10-31 DIAGNOSIS — Z8679 Personal history of other diseases of the circulatory system: Secondary | ICD-10-CM | POA: Diagnosis not present

## 2013-10-31 DIAGNOSIS — G40909 Epilepsy, unspecified, not intractable, without status epilepticus: Secondary | ICD-10-CM | POA: Insufficient documentation

## 2013-10-31 DIAGNOSIS — Z880 Allergy status to penicillin: Secondary | ICD-10-CM | POA: Insufficient documentation

## 2013-10-31 DIAGNOSIS — F319 Bipolar disorder, unspecified: Secondary | ICD-10-CM | POA: Insufficient documentation

## 2013-10-31 LAB — CBC WITH DIFFERENTIAL/PLATELET
Basophils Absolute: 0 10*3/uL (ref 0.0–0.1)
Basophils Relative: 0 % (ref 0–1)
Eosinophils Absolute: 0 10*3/uL (ref 0.0–0.7)
Eosinophils Relative: 1 % (ref 0–5)
HCT: 38.8 % (ref 36.0–46.0)
Hemoglobin: 13.4 g/dL (ref 12.0–15.0)
Lymphocytes Relative: 27 % (ref 12–46)
Lymphs Abs: 0.6 10*3/uL — ABNORMAL LOW (ref 0.7–4.0)
MCH: 31.8 pg (ref 26.0–34.0)
MCHC: 34.5 g/dL (ref 30.0–36.0)
MCV: 91.9 fL (ref 78.0–100.0)
Monocytes Absolute: 0.2 10*3/uL (ref 0.1–1.0)
Monocytes Relative: 9 % (ref 3–12)
Neutro Abs: 1.5 10*3/uL — ABNORMAL LOW (ref 1.7–7.7)
Neutrophils Relative %: 63 % (ref 43–77)
Platelets: 124 10*3/uL — ABNORMAL LOW (ref 150–400)
RBC: 4.22 MIL/uL (ref 3.87–5.11)
RDW: 14.5 % (ref 11.5–15.5)
WBC: 2.4 10*3/uL — ABNORMAL LOW (ref 4.0–10.5)

## 2013-10-31 LAB — BASIC METABOLIC PANEL
Anion gap: 8 (ref 5–15)
BUN: 10 mg/dL (ref 6–23)
CO2: 24 mEq/L (ref 19–32)
Calcium: 8.5 mg/dL (ref 8.4–10.5)
Chloride: 103 mEq/L (ref 96–112)
Creatinine, Ser: 0.53 mg/dL (ref 0.50–1.10)
GFR calc Af Amer: >90 mL/min (ref >90)
GFR calc non Af Amer: >90 mL/min (ref >90)
Glucose, Bld: 100 mg/dL — ABNORMAL HIGH (ref 70–99)
Potassium: 4.4 mEq/L (ref 3.7–5.3)
Sodium: 135 mEq/L — ABNORMAL LOW (ref 137–147)

## 2013-10-31 LAB — SEDIMENTATION RATE: Sed Rate: 43 mm/hr — ABNORMAL HIGH (ref 0–22)

## 2013-10-31 IMAGING — CT CT ANGIO HEAD
1 series · 1 of 2 positions shown · IV contrast (omnipaque)
Comparison: CT head [DATE]

CLINICAL DATA: Headache 2 weeks. Left neck pain. Rule out
dissection. Dizziness 2 days.

EXAM:
CT ANGIOGRAPHY HEAD AND NECK
TECHNIQUE: Multidetector CT imaging of the head and neck was performed using
the standard protocol during bolus administration of intravenous
contrast. Multiplanar CT image reconstructions and MIPs were
obtained to evaluate the vascular anatomy. Carotid stenosis
measurements (when applicable) are obtained utilizing NASCET
criteria, using the distal internal carotid diameter as the
denominator.
CONTRAST:  50mL OMNIPAQUE IOHEXOL 350 MG/ML SOLN

[Series 100: scout · coronal · 0.6mm · 0.98mm/px · 1 of 2 slices shown]
[im 2/2]
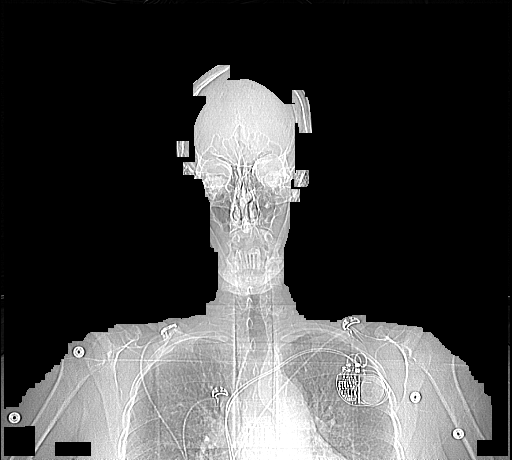

[1 of 2 positions shown; findings below may reference images not displayed]

FINDINGS: CTA HEAD FINDINGS

Mild atrophy. Chronic microvascular ischemic change in the white
matter. Low-density areas in the basal ganglia bilaterally may
represent chronic ischemia or benign cyst and are stable. Negative
for acute infarct. Negative for hemorrhage or mass. No enhancing
lesions postcontrast administration. Calvarium intact.

Left vertebral artery patent to the basilar. Left PICA patent.
Nondominant right vertebral artery. Right PICA patent. Hypoplastic
distal right vertebral artery. Basilar artery is small due to fetal
origin the posterior cerebral artery bilaterally. Basilar ends in
the superior cerebellar artery bilaterally. Posterior cerebral
arteries widely patent

Cavernous carotid widely patent bilaterally. Anterior and middle
cerebral arteries widely patent without stenosis or occlusion.

Negative for cerebral aneurysm.

Review of the MIP images confirms the above findings.

CTA NECK FINDINGS

4 mm right upper lobe lung nodule without calcification. Lung apices
are otherwise clear. No mass or adenopathy in the neck. Moderate
cervical spondylosis. Collateral veins are present in the neck due
to reflux of contrast from a left arm injection. Aortic arch and
proximal great vessels are patent.

Right carotid: Right common carotid artery widely patent. Mild
atherosclerotic disease of the carotid bifurcation with mild plaque
and calcification without significant stenosis or dissection

Left carotid: Left common carotid artery widely patent. Mild
atherosclerotic disease of the carotid bifurcation. Negative for
stenosis or dissection

Both vertebral arteries are patent. Left vertebral artery is
dominant.

Review of the MIP images confirms the above findings.
IMPRESSION: No significant intracranial stenosis

Mild atherosclerotic disease in the carotid bifurcation without
significant stenosis. Negative for carotid or vertebral artery
dissection.

4 mm right upper lobe nodule, likely benign. If the patient is at
high risk for bronchogenic carcinoma, follow-up chest CT at 1 year
is recommended. If the patient is at low risk, no follow-up is
needed. This recommendation follows the consensus statement:
Guidelines for Management of Small Pulmonary Nodules Detected on CT
Scans: A Statement from the [HOSPITAL] as published in

## 2013-10-31 IMAGING — CT CT ANGIO HEAD
1 of 14 series · 3 of 33 positions shown · IV contrast (Iohexol (Omnipaque 350))
Comparison: CT head [DATE]

CLINICAL DATA: Headache 2 weeks. Left neck pain. Rule out
dissection. Dizziness 2 days.

EXAM:
CT ANGIOGRAPHY HEAD AND NECK
TECHNIQUE: Multidetector CT imaging of the head and neck was performed using
the standard protocol during bolus administration of intravenous
contrast. Multiplanar CT image reconstructions and MIPs were
obtained to evaluate the vascular anatomy. Carotid stenosis
measurements (when applicable) are obtained utilizing NASCET
criteria, using the distal internal carotid diameter as the
denominator.
CONTRAST:  50mL OMNIPAQUE IOHEXOL 350 MG/ML SOLN

[Series 5013: mpr axial · axial · 0.44mm/px · z∈[+53,+389]mm · 3 of 337 slices shown]
[im 1/337  soft-tissue]
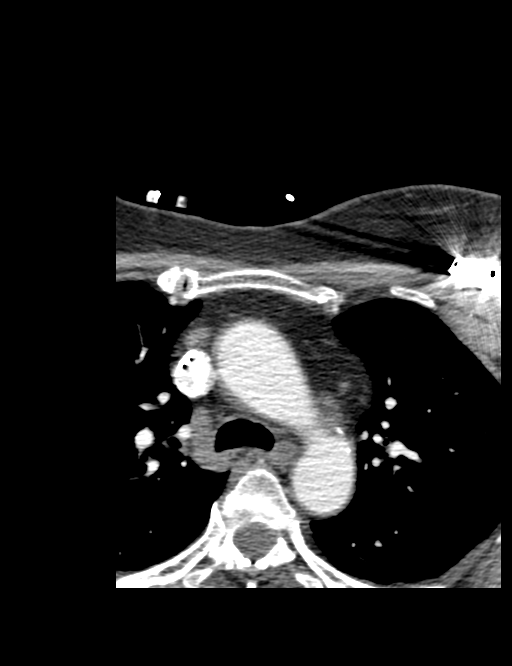
[im 169/337  bone]
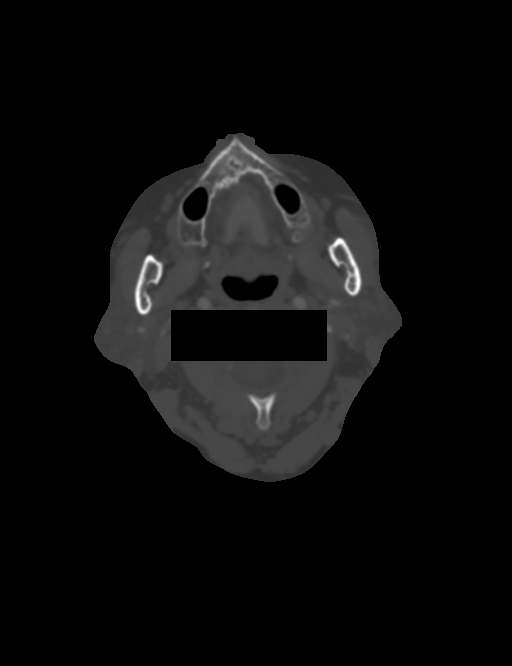
[im 337/337  soft-tissue]
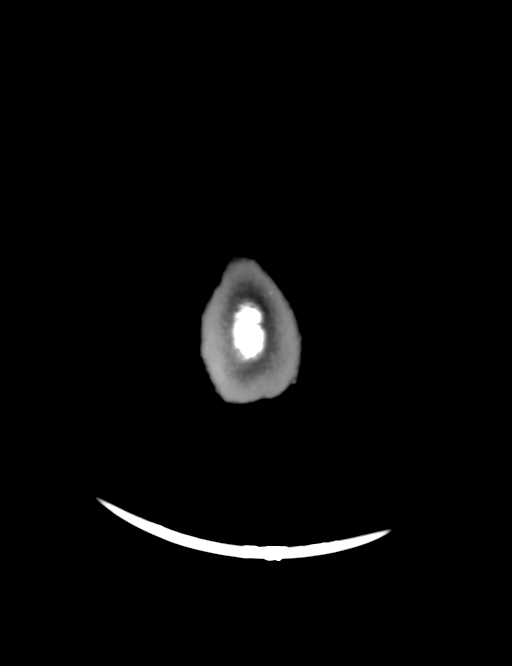

[3 of 33 positions shown; findings below may reference images not displayed]

FINDINGS: CTA HEAD FINDINGS

Mild atrophy. Chronic microvascular ischemic change in the white
matter. Low-density areas in the basal ganglia bilaterally may
represent chronic ischemia or benign cyst and are stable. Negative
for acute infarct. Negative for hemorrhage or mass. No enhancing
lesions postcontrast administration. Calvarium intact.

Left vertebral artery patent to the basilar. Left PICA patent.
Nondominant right vertebral artery. Right PICA patent. Hypoplastic
distal right vertebral artery. Basilar artery is small due to fetal
origin the posterior cerebral artery bilaterally. Basilar ends in
the superior cerebellar artery bilaterally. Posterior cerebral
arteries widely patent

Cavernous carotid widely patent bilaterally. Anterior and middle
cerebral arteries widely patent without stenosis or occlusion.

Negative for cerebral aneurysm.

Review of the MIP images confirms the above findings.

CTA NECK FINDINGS

4 mm right upper lobe lung nodule without calcification. Lung apices
are otherwise clear. No mass or adenopathy in the neck. Moderate
cervical spondylosis. Collateral veins are present in the neck due
to reflux of contrast from a left arm injection. Aortic arch and
proximal great vessels are patent.

Right carotid: Right common carotid artery widely patent. Mild
atherosclerotic disease of the carotid bifurcation with mild plaque
and calcification without significant stenosis or dissection

Left carotid: Left common carotid artery widely patent. Mild
atherosclerotic disease of the carotid bifurcation. Negative for
stenosis or dissection

Both vertebral arteries are patent. Left vertebral artery is
dominant.

Review of the MIP images confirms the above findings.
IMPRESSION: No significant intracranial stenosis

Mild atherosclerotic disease in the carotid bifurcation without
significant stenosis. Negative for carotid or vertebral artery
dissection.

4 mm right upper lobe nodule, likely benign. If the patient is at
high risk for bronchogenic carcinoma, follow-up chest CT at 1 year
is recommended. If the patient is at low risk, no follow-up is
needed. This recommendation follows the consensus statement:
Guidelines for Management of Small Pulmonary Nodules Detected on CT
Scans: A Statement from the [HOSPITAL] as published in

## 2013-10-31 MED ORDER — IOHEXOL 350 MG/ML SOLN
50.0000 mL | Freq: Once | INTRAVENOUS | Status: AC | PRN
  Administered 2013-10-31: 50 mL via INTRAVENOUS

## 2013-10-31 MED ORDER — SODIUM CHLORIDE 0.9 % IV BOLUS (SEPSIS)
500.0000 mL | Freq: Once | INTRAVENOUS | Status: AC
  Administered 2013-10-31: 500 mL via INTRAVENOUS

## 2013-10-31 MED ORDER — PROCHLORPERAZINE EDISYLATE 5 MG/ML IJ SOLN
10.0000 mg | Freq: Once | INTRAMUSCULAR | Status: DC
  Filled 2013-10-31: qty 2

## 2013-10-31 MED ORDER — MECLIZINE HCL 12.5 MG PO TABS: 12.5000 mg | ORAL_TABLET | Freq: Three times a day (TID) | ORAL | Status: DC | PRN

## 2013-10-31 NOTE — ED Notes (Signed)
Reather Converse, MD reports reviewing the [employer] report, I spoke with [name] with [employer] & was told the leads were WNL, & the report has been sent

## 2013-10-31 NOTE — ED Notes (Signed)
Social Worker at bedside.

## 2013-10-31 NOTE — Clinical Social Work Note (Addendum)
Clinical Social Worker spoke with patient at bedside regarding concerns for her current living situation.  Patient states that she is living at [facility] in [place] which is considered a group home.  Patient has been living there since January 2015 and is very unhappy.  Patient states that she has received necessary documentation from MD's to state that she is stable and capable to live independently again.  Patient states that she would like to find a senior living apartment option in [place], [place] in order to be closer to her daughter and is familiar with the area.  CSW explored a few options with patient at bedside, however patient is involved daily with Development worker, community in [place].  Patient states that she has a list of apartments already from the social worker at [employer] and has slowly been making calls, however she does not have access to a phone, internet, or transportation at the group home.  Patient is frustrated and states that she is not able to stay with her daughter temporarily and does not want to return to the group home.  Patient did verbalize understanding that she will likely have to return to the group home and continue to work on independently living options through [employer] of [place].  Patient taken to CT.  CSW to follow up again pending patient workup.  Barbette Or, [place]

## 2013-10-31 NOTE — ED Provider Notes (Addendum)
CSN: [id number]     Arrival date & time 10/31/13  0810 History   First MD Initiated Contact with Patient 10/31/13 [phone]     Chief Complaint  Patient presents with  . Dizziness  . Headache     (Consider location/radiation/quality/duration/timing/severity/associated sxs/prior Treatment) HPI Comments: 68 year old female with pacemaker requirement, autoimmune hepatitis, reflux, seizures, bipolar, multiple medicine allergies presents with headache, left neck pain, blurry vision and general viral symptoms. Patient occlusion approximately 2 weeks ago and since then has had fairly persistent gradual onset headache worse posteriorly. Patient felt it likely was viral in origin however she's concerned that has persisted for 2 weeks. Patient recently developed left neck pain sharp which she's never had before no injuries, no chiropractor use recently. No focal weakness unilateral. Patient has had a stroke in the past, no current blood thinners. Patient has vertigo symptoms for the past 2 or 3 days as well. Patient uses cane and walker normally. Possibly mild worsening gait however difficult to tell if she lies on these instruments to help her. Headache location bilateral temple, posterior head and left posterior auricular.  Patient is a 68 y.o. female presenting with dizziness and headaches. The history is provided by the patient.  Dizziness Associated symptoms: headaches   Associated symptoms: no chest pain, no shortness of breath and no vomiting   Headache Associated symptoms: dizziness (worse with position)   Associated symptoms: no abdominal pain, no back pain, no congestion, no fever, no neck pain, no neck stiffness and no vomiting     Past Medical History  Diagnosis Date  . Seizures   . Bipolar 1 disorder   . Sinus arrest   . Pacemaker [employer]     MRI compatible  . Hypotension   . Autoimmune hepatitis   . GERD (gastroesophageal reflux disease)   . Hypothyroidism    Past Surgical History   Procedure Laterality Date  . Pacemaker insertion     Family History  Problem Relation Age of Onset  . Heart attack Father   . Heart disease Paternal Uncle   . Heart disease Paternal Grandmother   . Diabetes Paternal Grandmother   . Heart disease Paternal Grandfather   . Cancer Mother     BREAST  . Diabetes Sister   . Diabetes Brother   . Cancer Maternal Grandmother   . CVA Maternal Grandmother   . Cancer Maternal Grandfather   . CVA Maternal Grandfather   . Diabetes Brother    History  Substance Use Topics  . Smoking status: Never Smoker   . Smokeless tobacco: Not on file  . Alcohol Use: No   OB History   Grav Para Term Preterm Abortions TAB SAB Ect Mult Living                 Review of Systems  Constitutional: Negative for fever and chills.  HENT: Negative for congestion.   Eyes: Negative for visual disturbance.  Respiratory: Negative for shortness of breath.   Cardiovascular: Negative for chest pain.  Gastrointestinal: Negative for vomiting and abdominal pain.  Genitourinary: Negative for dysuria and flank pain.  Musculoskeletal: Negative for back pain, neck pain and neck stiffness.  Skin: Negative for rash.  Neurological: Positive for dizziness (worse with position), weakness (general weakness) and headaches. Negative for syncope and light-headedness.      Allergies  Ambien; Amitriptyline; Ampicillin; Anaprox; Benadryl; Cetirizine & related; Cortizone-10; Darvon; Diazepam; Diflunisal; Duloxetine; Flexeril; Lidocaine; Meperidine and related; Metanx; Metoclopramide; Morphine and related; Nuprin; Oxycodone; Penicillins; Propoxyphene;  Ranitidine; Tylenol; and Vicodin  Home Medications   Prior to Admission medications   Medication Sig Start Date End Date Taking? Authorizing Provider  azaTHIOprine (IMURAN) 50 MG tablet Take 100 mg by mouth daily.    Historical Provider, MD  Cholecalciferol (VITAMIN D) 1000 UNITS capsule Take 1,000 Units by mouth daily.     Historical Provider, MD  ketoprofen (ORUDIS) 75 MG capsule Take 75 mg by mouth 3 (three) times daily as needed (for headache).    Historical Provider, MD  Lactobacillus (LACTINEX) PACK Take 1 each by mouth daily as needed (for lactose intolerance).     Historical Provider, MD  levETIRAcetam (KEPPRA) 250 MG tablet Take 250 mg by mouth 2 (two) times daily.    Historical Provider, MD  levothyroxine (SYNTHROID, LEVOTHROID) 100 MCG tablet Take 100 mcg by mouth daily before breakfast.    Historical Provider, MD  risperiDONE (RISPERDAL) 3 MG tablet Take 3 mg by mouth at bedtime.    Historical Provider, MD   SpO2 97% Physical Exam  Nursing note and vitals reviewed. Constitutional: She is oriented to person, place, and time. She appears well-developed and well-nourished.  HENT:  Head: Normocephalic and atraumatic.  Eyes: Conjunctivae are normal. Right eye exhibits no discharge. Left eye exhibits no discharge.  Neck: Normal range of motion. Neck supple. No tracheal deviation present.  Cardiovascular: Normal rate and regular rhythm.   Pulmonary/Chest: Effort normal and breath sounds normal.  Abdominal: Soft. She exhibits no distension. There is no tenderness. There is no guarding.  Musculoskeletal: She exhibits no edema.  Neck supple, no temple tenderness  Neurological: She is alert and oriented to person, place, and time. GCS eye subscore is 4. GCS verbal subscore is 5. GCS motor subscore is 6.  5+ strength in UE and LE with f/e at major joints. Sensation to palpation intact in UE and LE. CNs 2-12 grossly intact.  EOMFI.  PERRL.   Finger nose and coordination intact bilateral.   Visual fields intact to finger testing. Patient is able to take steps on her own without significant difficulty however at times needs to hold on to something which she says is at her baseline she never walks without assistance more than a few steps.  Skin: Skin is warm. No rash noted.  Psychiatric: She has a normal mood  and affect.    ED Course  Procedures (including critical care time) Labs Review Labs Reviewed  CBC WITH DIFFERENTIAL - Abnormal; Notable for the following:    WBC 2.4 (*)    Platelets 124 (*)    Neutro Abs 1.5 (*)    Lymphs Abs 0.6 (*)    All other components within normal limits  SEDIMENTATION RATE - Abnormal; Notable for the following:    Sed Rate 43 (*)    All other components within normal limits  BASIC METABOLIC PANEL - Abnormal; Notable for the following:    Sodium 135 (*)    Glucose, Bld 100 (*)    All other components within normal limits    Imaging Review Ct Angio Head W/cm &/or Wo Cm  10/31/2013   CLINICAL DATA:  Headache 2 weeks. Left neck pain. Rule out dissection. Dizziness 2 days.  EXAM: CT ANGIOGRAPHY HEAD AND NECK  TECHNIQUE: Multidetector CT imaging of the head and neck was performed using the standard protocol during bolus administration of intravenous contrast. Multiplanar CT image reconstructions and MIPs were obtained to evaluate the vascular anatomy. Carotid stenosis measurements (when applicable) are obtained utilizing NASCET criteria,  using the distal internal carotid diameter as the denominator.  CONTRAST:  38mL OMNIPAQUE IOHEXOL 350 MG/ML SOLN  COMPARISON:  CT head 02/21/2013  FINDINGS: CTA HEAD FINDINGS  Mild atrophy. Chronic microvascular ischemic change in the white matter. Low-density areas in the basal ganglia bilaterally may represent chronic ischemia or benign cyst and are stable. Negative for acute infarct. Negative for hemorrhage or mass. No enhancing lesions postcontrast administration. Calvarium intact.  Left vertebral artery patent to the basilar. Left PICA patent. Nondominant right vertebral artery. Right PICA patent. Hypoplastic distal right vertebral artery. Basilar artery is small due to fetal origin the posterior cerebral artery bilaterally. Basilar ends in the superior cerebellar artery bilaterally. Posterior cerebral arteries widely patent   Cavernous carotid widely patent bilaterally. Anterior and middle cerebral arteries widely patent without stenosis or occlusion.  Negative for cerebral aneurysm.  Review of the MIP images confirms the above findings.  CTA NECK FINDINGS  4 mm right upper lobe lung nodule without calcification. Lung apices are otherwise clear. No mass or adenopathy in the neck. Moderate cervical spondylosis. Collateral veins are present in the neck due to reflux of contrast from a left arm injection. Aortic arch and proximal great vessels are patent.  Right carotid: Right common carotid artery widely patent. Mild atherosclerotic disease of the carotid bifurcation with mild plaque and calcification without significant stenosis or dissection  Left carotid: Left common carotid artery widely patent. Mild atherosclerotic disease of the carotid bifurcation. Negative for stenosis or dissection  Both vertebral arteries are patent. Left vertebral artery is dominant.  Review of the MIP images confirms the above findings.  IMPRESSION: No significant intracranial stenosis  Mild atherosclerotic disease in the carotid bifurcation without significant stenosis. Negative for carotid or vertebral artery dissection.  4 mm right upper lobe nodule, likely benign. If the patient is at high risk for bronchogenic carcinoma, follow-up chest CT at 1 year is recommended. If the patient is at low risk, no follow-up is needed. This recommendation follows the consensus statement: Guidelines for Management of Small Pulmonary Nodules Detected on CT Scans: A Statement from the [place] as published in Radiology 2005; 237:395-400.   Electronically Signed   By: Franchot Gallo M.D.   On: 10/31/2013 11:10   Ct Angio Neck W/cm &/or Wo/cm  10/31/2013   CLINICAL DATA:  Headache 2 weeks. Left neck pain. Rule out dissection. Dizziness 2 days.  EXAM: CT ANGIOGRAPHY HEAD AND NECK  TECHNIQUE: Multidetector CT imaging of the head and neck was performed using the  standard protocol during bolus administration of intravenous contrast. Multiplanar CT image reconstructions and MIPs were obtained to evaluate the vascular anatomy. Carotid stenosis measurements (when applicable) are obtained utilizing NASCET criteria, using the distal internal carotid diameter as the denominator.  CONTRAST:  62mL OMNIPAQUE IOHEXOL 350 MG/ML SOLN  COMPARISON:  CT head 02/21/2013  FINDINGS: CTA HEAD FINDINGS  Mild atrophy. Chronic microvascular ischemic change in the white matter. Low-density areas in the basal ganglia bilaterally may represent chronic ischemia or benign cyst and are stable. Negative for acute infarct. Negative for hemorrhage or mass. No enhancing lesions postcontrast administration. Calvarium intact.  Left vertebral artery patent to the basilar. Left PICA patent. Nondominant right vertebral artery. Right PICA patent. Hypoplastic distal right vertebral artery. Basilar artery is small due to fetal origin the posterior cerebral artery bilaterally. Basilar ends in the superior cerebellar artery bilaterally. Posterior cerebral arteries widely patent  Cavernous carotid widely patent bilaterally. Anterior and middle cerebral arteries widely patent without  stenosis or occlusion.  Negative for cerebral aneurysm.  Review of the MIP images confirms the above findings.  CTA NECK FINDINGS  4 mm right upper lobe lung nodule without calcification. Lung apices are otherwise clear. No mass or adenopathy in the neck. Moderate cervical spondylosis. Collateral veins are present in the neck due to reflux of contrast from a left arm injection. Aortic arch and proximal great vessels are patent.  Right carotid: Right common carotid artery widely patent. Mild atherosclerotic disease of the carotid bifurcation with mild plaque and calcification without significant stenosis or dissection  Left carotid: Left common carotid artery widely patent. Mild atherosclerotic disease of the carotid bifurcation. Negative  for stenosis or dissection  Both vertebral arteries are patent. Left vertebral artery is dominant.  Review of the MIP images confirms the above findings.  IMPRESSION: No significant intracranial stenosis  Mild atherosclerotic disease in the carotid bifurcation without significant stenosis. Negative for carotid or vertebral artery dissection.  4 mm right upper lobe nodule, likely benign. If the patient is at high risk for bronchogenic carcinoma, follow-up chest CT at 1 year is recommended. If the patient is at low risk, no follow-up is needed. This recommendation follows the consensus statement: Guidelines for Management of Small Pulmonary Nodules Detected on CT Scans: A Statement from the [place] as published in Radiology 2005; 237:395-400.   Electronically Signed   By: Franchot Gallo M.D.   On: 10/31/2013 11:10     EKG Interpretation   Date/Time:  Wednesday October 31 2013 10:56:30 EDT Ventricular Rate:  68 PR Interval:  200 QRS Duration: 92 QT Interval:  411 QTC Calculation: 437 R Axis:   72 Text Interpretation:  Sinus rhythm Low voltage, extremity and precordial  leads similar to previous Confirmed by Jarrod Mcenery  MD, Escarlet Saathoff ([id number]) on  10/31/2013 11:19:01 AM      MDM   Final diagnoses:  Nodule of right lung  Headache, unspecified headache type  Vertigo   Patient presented from the group home with persistent headache for 2 weeks and worsening vertigo and left neck pain. With stroke history and persistent symptoms concern for possible stroke versus likely dissection. CT angina head and neck ordered, migraine treatment ordered, basic blood work pending.  Patient recheck. CT scan results reviewed small pulmonary nodule, patient nonsmoker and can followup outpatient with her primary care Dr. Patient had mild bradycardia without significant symptoms, EKG done similar to previous. Pacemaker interrogated no acute findings per report, working okay. Followup outpatient Results and  differential diagnosis were discussed with the patient/parent/guardian. Close follow up outpatient was discussed, comfortable with the plan.   Medications  prochlorperazine (COMPAZINE) injection 10 mg (not administered)  sodium chloride 0.9 % bolus 500 mL (0 mLs Intravenous Stopped 10/31/13 1114)  iohexol (OMNIPAQUE) 350 MG/ML injection 50 mL (50 mLs Intravenous Contrast Given 10/31/13 1020)    Filed Vitals:   10/31/13 0930 10/31/13 1000 10/31/13 1047 10/31/13 1100  BP: 132/77 147/95 129/75 129/76  Pulse: 69 70 41 64  Resp: 13 16 14 13   SpO2: 98% 99% 99% 97%    Final diagnoses:  Nodule of right lung  Headache, unspecified headache type  Vertigo       Mariea Clonts, MD 10/31/13 1142 and or air or is  Mariea Clonts, MD 10/31/13 1146

## 2013-10-31 NOTE — ED Notes (Signed)
Pt in from [place]. [place] via [place], pt c/o HA x 2 wks & dizziness onset x 2 days, pt A&O x4, follows commands, speaks in complete sentences, denies CP, SOB, n/v/d, per PTAR the pt is from [place], per PTAR report there are  No cognitive or developmental issues reported

## 2013-10-31 NOTE — ED Notes (Signed)
Pt. Does not want any medication at the present time.  She denies any nausea and a mild headache 1/10.  Reported to pt. That medication is available if she needs some

## 2013-10-31 NOTE — ED Notes (Signed)
PTAR reports that the pt was not aware she would be placed in a group home when moving from [place], per PTAR the pt pays $1000.00/mth & shares a room with another person & receives a jelly sandwich for lunch, Case management notified

## 2013-10-31 NOTE — ED Notes (Signed)
PATIENT CARE GIVER HAS ARRIVED TO TAKE PT HOME. PT DISCHARGED VIA [facility].

## 2013-10-31 NOTE — ED Notes (Signed)
Stacey Velazquez, Fish farm manager

## 2013-10-31 NOTE — ED Notes (Signed)
PTAR notified for pt transfer.  

## 2013-10-31 NOTE — ED Notes (Signed)
Z[place], MD at bedside 

## 2013-10-31 NOTE — ED Notes (Signed)
Per pt she is unable to get into group home until after 14:30, pt to be discharged after this time, Reather Converse, MD aware, POD C RN informed of situation, pt provided a lunch tray

## 2013-10-31 NOTE — Discharge Instructions (Signed)
If you were given medicines take as directed.  If you are on coumadin or contraceptives realize their levels and effectiveness is altered by many different medicines.  If you have any reaction (rash, tongues swelling, other) to the medicines stop taking and see a physician.   Please follow up as directed and return to the ER or see a physician for new or worsening symptoms.  Thank you. Filed Vitals:   10/31/13 0930 10/31/13 1000 10/31/13 1047 10/31/13 1100  BP: 132/77 147/95 129/75 129/76  Pulse: 69 70 41 64  Resp: 13 16 14 13   SpO2: 98% 99% 99% 97%

## 2013-11-06 ENCOUNTER — Encounter: Payer: Self-pay | Admitting: Internal Medicine

## 2013-11-08 DIAGNOSIS — R42 Dizziness and giddiness: Secondary | ICD-10-CM | POA: Diagnosis not present

## 2013-11-22 DIAGNOSIS — F319 Bipolar disorder, unspecified: Secondary | ICD-10-CM | POA: Diagnosis not present

## 2014-01-02 ENCOUNTER — Ambulatory Visit (INDEPENDENT_AMBULATORY_CARE_PROVIDER_SITE_OTHER): Payer: Medicare Other | Admitting: *Deleted

## 2014-01-02 ENCOUNTER — Encounter: Payer: Self-pay | Admitting: Internal Medicine

## 2014-01-02 ENCOUNTER — Telehealth: Payer: Self-pay | Admitting: Cardiology

## 2014-01-02 DIAGNOSIS — I495 Sick sinus syndrome: Secondary | ICD-10-CM | POA: Diagnosis not present

## 2014-01-02 LAB — MDC_IDC_ENUM_SESS_TYPE_REMOTE
Battery Remaining Longevity: 104 mo
Battery Voltage: 3.02 V
Brady Statistic AP VP Percent: 0.01 %
Brady Statistic AP VS Percent: 7.21 %
Brady Statistic AS VP Percent: 0.04 %
Brady Statistic AS VS Percent: 92.74 %
Brady Statistic RA Percent Paced: 7.22 %
Brady Statistic RV Percent Paced: 0.05 %
Date Time Interrogation Session: 20151209212357
Lead Channel Impedance Value: 323 Ohm
Lead Channel Impedance Value: 399 Ohm
Lead Channel Impedance Value: 418 Ohm
Lead Channel Impedance Value: 513 Ohm
Lead Channel Pacing Threshold Amplitude: 0.5 V
Lead Channel Pacing Threshold Amplitude: 0.625 V
Lead Channel Pacing Threshold Pulse Width: 0.4 ms
Lead Channel Pacing Threshold Pulse Width: 0.4 ms
Lead Channel Sensing Intrinsic Amplitude: 1.875 mV
Lead Channel Sensing Intrinsic Amplitude: 4 mV
Lead Channel Setting Pacing Amplitude: 1.5 V
Lead Channel Setting Pacing Amplitude: 2 V
Lead Channel Setting Pacing Pulse Width: 0.4 ms
Lead Channel Setting Sensing Sensitivity: 0.6 mV
Zone Setting Detection Interval: 350 ms
Zone Setting Detection Interval: 400 ms

## 2014-01-02 NOTE — Progress Notes (Signed)
Remote pacemaker transmission.   

## 2014-01-02 NOTE — Telephone Encounter (Signed)
Attempted to call pt and confirm remote transmission. No answer and unable to leave a message.

## 2014-01-31 ENCOUNTER — Encounter: Payer: Self-pay | Admitting: Cardiology

## 2014-02-14 DIAGNOSIS — F319 Bipolar disorder, unspecified: Secondary | ICD-10-CM | POA: Diagnosis not present

## 2014-04-02 ENCOUNTER — Ambulatory Visit (INDEPENDENT_AMBULATORY_CARE_PROVIDER_SITE_OTHER): Payer: Medicare Other | Admitting: Internal Medicine

## 2014-04-02 ENCOUNTER — Encounter: Payer: Self-pay | Admitting: Internal Medicine

## 2014-04-02 VITALS — BP 112/86 | HR 92 | Ht 69.0 in | Wt 207.0 lb

## 2014-04-02 DIAGNOSIS — Z79899 Other long term (current) drug therapy: Secondary | ICD-10-CM | POA: Diagnosis not present

## 2014-04-02 DIAGNOSIS — R55 Syncope and collapse: Secondary | ICD-10-CM

## 2014-04-02 DIAGNOSIS — I495 Sick sinus syndrome: Secondary | ICD-10-CM

## 2014-04-02 DIAGNOSIS — I455 Other specified heart block: Secondary | ICD-10-CM | POA: Diagnosis not present

## 2014-04-02 DIAGNOSIS — Z45018 Encounter for adjustment and management of other part of cardiac pacemaker: Secondary | ICD-10-CM

## 2014-04-02 LAB — MDC_IDC_ENUM_SESS_TYPE_INCLINIC
Battery Remaining Longevity: 103 mo
Battery Voltage: 3.02 V
Brady Statistic AP VP Percent: 0.01 %
Brady Statistic AP VS Percent: 8.71 %
Brady Statistic AS VP Percent: 0.04 %
Brady Statistic AS VS Percent: 91.24 %
Brady Statistic RA Percent Paced: 8.72 %
Brady Statistic RV Percent Paced: 0.05 %
Date Time Interrogation Session: 20160308164257
Lead Channel Impedance Value: 342 Ohm
Lead Channel Impedance Value: 418 Ohm
Lead Channel Impedance Value: 475 Ohm
Lead Channel Impedance Value: 551 Ohm
Lead Channel Pacing Threshold Amplitude: 0.5 V
Lead Channel Pacing Threshold Amplitude: 0.75 V
Lead Channel Pacing Threshold Pulse Width: 0.4 ms
Lead Channel Pacing Threshold Pulse Width: 0.4 ms
Lead Channel Sensing Intrinsic Amplitude: 3.375 mV
Lead Channel Sensing Intrinsic Amplitude: 6.5 mV
Lead Channel Setting Pacing Amplitude: 2 V
Lead Channel Setting Pacing Amplitude: 2.5 V
Lead Channel Setting Pacing Pulse Width: 0.4 ms
Lead Channel Setting Sensing Sensitivity: 0.6 mV
Zone Setting Detection Interval: 350 ms
Zone Setting Detection Interval: 400 ms

## 2014-04-02 MED ORDER — FUROSEMIDE 20 MG PO TABS: 20.0000 mg | ORAL_TABLET | Freq: Every day | ORAL | Status: DC

## 2014-04-02 NOTE — Progress Notes (Signed)
Patient Care Team: Elizabeth Palau, MD as PCP - General (Family Medicine)   HPI  Stacey Velazquez is a 69 y.o. female Seen to establish pacemaker followup. This was implanted 12/14 for sinus arrest and syncope. It was done in [place]. We do not have records. She does not recall any specific cardiac evaluation.  She has been on nitrates and aspirin for many years; this was stopped last visit 2/2 hypotension and no CAD  She has some sob and some edema;  Previously was on diuretics but this was stopped for reasons that she does not recall  She lives in a group home. She has 2 daughters.       Past Medical History  Diagnosis Date  . Seizures   . Bipolar 1 disorder   . Sinus arrest   . Pacemaker Medtronic     MRI compatible  . Hypotension   . Autoimmune hepatitis   . GERD (gastroesophageal reflux disease)   . Hypothyroidism     Past Surgical History  Procedure Laterality Date  . Pacemaker insertion      Current Outpatient Prescriptions  Medication Sig Dispense Refill  . azaTHIOprine (IMURAN) 50 MG tablet Take 100 mg by mouth daily.    . Cholecalciferol (VITAMIN D) 1000 UNITS capsule Take 1,000 Units by mouth daily.    Marland Kitchen ketoprofen (ORUDIS) 75 MG capsule Take 75 mg by mouth 3 (three) times daily as needed (for headache).    . Lactobacillus (LACTINEX) PACK Take 1 each by mouth daily as needed (for lactose intolerance).     Marland Kitchen levETIRAcetam (KEPPRA) 250 MG tablet Take 250 mg by mouth 2 (two) times daily.    Marland Kitchen levothyroxine (SYNTHROID, LEVOTHROID) 100 MCG tablet Take 100 mcg by mouth daily before breakfast.    . meclizine (ANTIVERT) 12.5 MG tablet Take 1 tablet (12.5 mg total) by mouth 3 (three) times daily as needed for dizziness. 10 tablet 0  . risperiDONE (RISPERDAL) 3 MG tablet Take 3 mg by mouth at bedtime.     No current facility-administered medications for this visit.    Allergies  Allergen Reactions  . Ambien [Zolpidem Tartrate] Other (See Comments)   Hallucinations   . Amitriptyline Other (See Comments)    Reaction unknown  . Ampicillin Other (See Comments)    Stops heart per pt   . Anaprox [Naproxen Sodium] Other (See Comments)    Reaction unknown  . Benadryl [Diphenhydramine Hcl (Sleep)] Other (See Comments)    Rash  . Cetirizine & Related Other (See Comments)    Swelling   . Cortizone-10 [Hydrocortisone] Other (See Comments)    seizures   . Darvon [Propoxyphene] Other (See Comments)    Increased heart rate per pt   . Diazepam Other (See Comments)    Reaction unknown  . Diflunisal Other (See Comments)    Reaction unknown  . Duloxetine Other (See Comments)    Reaction unknown  . Flexeril [Cyclobenzaprine] Other (See Comments)    Seizures   . Lidocaine Other (See Comments)    Seizures  . Meperidine And Related Other (See Comments)    Reaction unknown  . Metanx [L-Methylfolate-Algae-B12-B6] Other (See Comments)    Reaction unknown  . Metoclopramide Other (See Comments)    Reaction unknown  . Morphine And Related Other (See Comments)    Stops heart per pt   . Naproxen   . Nuprin [Ibuprofen] Other (See Comments)    Reaction unknown  . Oxycodone Other (See Comments)  Hallucinations   . Penicillins Other (See Comments)    Stops heart per pt   . Propoxyphene Other (See Comments)    Reaction unknown  . Ranitidine Other (See Comments)    Reaction unknown  . Tylenol [Acetaminophen] Other (See Comments)    Reaction unknown  . Vicodin [Hydrocodone-Acetaminophen] Other (See Comments)    Seizures     Review of Systems negative except from HPI and PMH  Physical Exam BP 112/86 mmHg  Pulse 92  Ht 5\' 9"  (1.753 m)  Wt 207 lb (93.895 kg)  BMI 30.55 kg/m2 Well developed and well nourished in no acute distress HENT normal E scleral and icterus clear Neck Supple JVP 7; carotids brisk and full Clear to ausculation Device pocket well healed; without hematoma or erythema.  There is no tethering Regular rate and  rhythm, no murmurs gallops or rub Soft with active bowel sounds No clubbing cyanosis trace Edema Alert and oriented, grossly normal motor and sensory function Skin Warm and Dry  ECG sinsu 17/08/36 Poor rwave progression LVLL  Assessment and  Plan  Sinus node dysfunction  Pacemaker-Medtronic  Volume overload-CHF  Low voltage ECG   Device function is normal with 9% A pacing  I am impressed by the low voltage on her ECG and so will do echo to look at LV function and LV wall thickness It will be important to exclude infiltrative heart disease and so we were looking for left ventricular hypertrophy  Will begin low dose diuretics and plan to check BMET in about two weeks;  Kidney function was normal 10/15

## 2014-04-02 NOTE — Patient Instructions (Signed)
Your physician has recommended you make the following change in your medication:  1) START Furosemide 20 mg daily  Your physician recommends that you return for lab work in: 2 weeks for [employer] are being referred to [place] physician has requested that you have an echocardiogram. Echocardiography is a painless test that uses sound waves to create images of your heart. It provides your doctor with information about the size and shape of your heart and how well your heart's chambers and valves are working. This procedure takes approximately one hour. There are no restrictions for this procedure.  Remote monitoring is used to monitor your Pacemaker of ICD from home. This monitoring reduces the number of office visits required to check your device to one time per year. It allows [place] to keep an eye on the functioning of your device to ensure it is working properly. You are scheduled for a device check from home on 07/02/14. You may send your transmission at any time that day. If you have a wireless device, the transmission will be sent automatically. After your physician reviews your transmission, you will receive a postcard with your next transmission date.  Your physician wants you to follow-up in: 1 year with Dr. Caryl Comes.  You will receive a reminder letter in the mail two months in advance. If you don't receive a letter, please call our office to schedule the follow-up appointment.

## 2014-04-09 ENCOUNTER — Other Ambulatory Visit: Payer: Self-pay | Admitting: *Deleted

## 2014-04-09 ENCOUNTER — Encounter: Payer: Self-pay | Admitting: Internal Medicine

## 2014-04-09 DIAGNOSIS — I959 Hypotension, unspecified: Secondary | ICD-10-CM

## 2014-04-16 ENCOUNTER — Ambulatory Visit (HOSPITAL_COMMUNITY): Payer: Medicare Other | Attending: Internal Medicine | Admitting: Radiology

## 2014-04-16 ENCOUNTER — Other Ambulatory Visit (INDEPENDENT_AMBULATORY_CARE_PROVIDER_SITE_OTHER): Payer: Medicare Other | Admitting: *Deleted

## 2014-04-16 DIAGNOSIS — Z79899 Other long term (current) drug therapy: Secondary | ICD-10-CM

## 2014-04-16 DIAGNOSIS — I495 Sick sinus syndrome: Secondary | ICD-10-CM | POA: Diagnosis not present

## 2014-04-16 DIAGNOSIS — E039 Hypothyroidism, unspecified: Secondary | ICD-10-CM | POA: Diagnosis not present

## 2014-04-16 LAB — BASIC METABOLIC PANEL
BUN: 10 mg/dL (ref 6–23)
CO2: 21 mEq/L (ref 19–32)
Calcium: 8.6 mg/dL (ref 8.4–10.5)
Chloride: 102 mEq/L (ref 96–112)
Creatinine, Ser: 0.66 mg/dL (ref 0.40–1.20)
GFR: 94.15 mL/min (ref >60.00)
Glucose, Bld: 207 mg/dL — ABNORMAL HIGH (ref 70–99)
Potassium: 3.5 mEq/L (ref 3.5–5.1)
Sodium: 129 mEq/L — ABNORMAL LOW (ref 135–145)

## 2014-04-16 NOTE — Progress Notes (Signed)
Echocardiogram performed.  

## 2014-04-16 NOTE — Addendum Note (Signed)
Addended by: Eulis Foster on: 04/16/2014 02:54 PM   Modules accepted: Orders

## 2014-04-17 ENCOUNTER — Ambulatory Visit: Payer: Medicare Other | Admitting: Internal Medicine

## 2014-04-25 ENCOUNTER — Other Ambulatory Visit: Payer: Self-pay | Admitting: *Deleted

## 2014-04-25 DIAGNOSIS — E871 Hypo-osmolality and hyponatremia: Secondary | ICD-10-CM

## 2014-05-06 ENCOUNTER — Other Ambulatory Visit: Payer: Medicare Other

## 2014-05-08 DIAGNOSIS — F312 Bipolar disorder, current episode manic severe with psychotic features: Secondary | ICD-10-CM | POA: Diagnosis not present

## 2014-05-08 DIAGNOSIS — F411 Generalized anxiety disorder: Secondary | ICD-10-CM | POA: Diagnosis not present

## 2014-05-10 ENCOUNTER — Encounter: Payer: Self-pay | Admitting: Internal Medicine

## 2014-05-10 ENCOUNTER — Ambulatory Visit: Payer: Medicare Other | Attending: Internal Medicine | Admitting: Internal Medicine

## 2014-05-10 ENCOUNTER — Other Ambulatory Visit (INDEPENDENT_AMBULATORY_CARE_PROVIDER_SITE_OTHER): Payer: Medicare Other | Admitting: *Deleted

## 2014-05-10 VITALS — BP 136/73 | HR 110 | Temp 98.6°F | Resp 20 | Ht 69.0 in | Wt 206.0 lb

## 2014-05-10 DIAGNOSIS — E038 Other specified hypothyroidism: Secondary | ICD-10-CM

## 2014-05-10 DIAGNOSIS — E039 Hypothyroidism, unspecified: Secondary | ICD-10-CM | POA: Diagnosis not present

## 2014-05-10 DIAGNOSIS — Z950 Presence of cardiac pacemaker: Secondary | ICD-10-CM

## 2014-05-10 DIAGNOSIS — E871 Hypo-osmolality and hyponatremia: Secondary | ICD-10-CM | POA: Diagnosis not present

## 2014-05-10 DIAGNOSIS — F39 Unspecified mood [affective] disorder: Secondary | ICD-10-CM

## 2014-05-10 DIAGNOSIS — H8110 Benign paroxysmal vertigo, unspecified ear: Secondary | ICD-10-CM

## 2014-05-10 DIAGNOSIS — R569 Unspecified convulsions: Secondary | ICD-10-CM

## 2014-05-10 DIAGNOSIS — K754 Autoimmune hepatitis: Secondary | ICD-10-CM | POA: Diagnosis not present

## 2014-05-10 LAB — BASIC METABOLIC PANEL
BUN: 10 mg/dL (ref 6–23)
CO2: 24 mEq/L (ref 19–32)
Calcium: 8.9 mg/dL (ref 8.4–10.5)
Chloride: 103 mEq/L (ref 96–112)
Creatinine, Ser: 0.68 mg/dL (ref 0.40–1.20)
GFR: 90.95 mL/min (ref >60.00)
Glucose, Bld: 127 mg/dL — ABNORMAL HIGH (ref 70–99)
Potassium: 3.6 mEq/L (ref 3.5–5.1)
Sodium: 131 mEq/L — ABNORMAL LOW (ref 135–145)

## 2014-05-10 LAB — TSH: TSH: 1.605 u[IU]/mL (ref 0.350–4.500)

## 2014-05-10 MED ORDER — KETOPROFEN 75 MG PO CAPS: 75.0000 mg | ORAL_CAPSULE | Freq: Three times a day (TID) | ORAL | Status: DC | PRN

## 2014-05-10 MED ORDER — AZATHIOPRINE 50 MG PO TABS: 100.0000 mg | ORAL_TABLET | Freq: Every day | ORAL | Status: DC

## 2014-05-10 MED ORDER — LEVETIRACETAM 250 MG PO TABS: 250.0000 mg | ORAL_TABLET | Freq: Two times a day (BID) | ORAL | Status: DC

## 2014-05-10 MED ORDER — MECLIZINE HCL 12.5 MG PO TABS: 12.5000 mg | ORAL_TABLET | Freq: Three times a day (TID) | ORAL | Status: DC | PRN

## 2014-05-10 MED ORDER — LEVOTHYROXINE SODIUM 100 MCG PO TABS: 100.0000 ug | ORAL_TABLET | Freq: Every day | ORAL | Status: DC

## 2014-05-10 NOTE — Progress Notes (Signed)
Patient here to establish care. Current PCP close to retirement. Needing medication refills of imuran, orudis, keppra, synthroid, antivert. Hx of autoimmune hepatitis, s/p pacemaker placement on 12/14 for sinus arrest, bipolar disorder.

## 2014-05-10 NOTE — Progress Notes (Signed)
Patient Demographics  Stacey Velazquez, is a 69 y.o. female  [id number]  [id number]  DOB - 10-13-1945  CC:  Chief Complaint  Patient presents with  . Establish Care       HPI: Stacey Velazquez is a 69 y.o. female here today to establish medical care.Patient has history of hypothyroidism,history of sinus arrest status post pacemaker, following up with cardiology, history of bipolar disorder currently following up with psychiatrist, history of autoimmune hepatitis as per patient she was seen hepatologist and has been taking Imuron which he needs refill today as she has not been able to see her specialist because of transportation issues, patient also has history of BPPV and takes meclizine when necessary. Patient currently denies any acute symptoms, as per patient she has a blood work ordered by her cardiologist and will do it today.Patient also history of seizure disorder and is taking Keppra, as per patient her last seizure was more than 1 year ago. Patient has No headache, No chest pain, No abdominal pain - No Nausea, No new weakness tingling or numbness, No Cough - SOB.  Allergies  Allergen Reactions  . Ambien [Zolpidem Tartrate] Other (See Comments)    Hallucinations   . Amitriptyline Other (See Comments)    Reaction unknown  . Ampicillin Other (See Comments)    Stops heart per pt   . Anaprox [Naproxen Sodium] Other (See Comments)    Reaction unknown  . Benadryl [Diphenhydramine Hcl (Sleep)] Other (See Comments)    Rash  . Cetirizine & Related Other (See Comments)    Swelling   . Cortizone-10 [Hydrocortisone] Other (See Comments)    seizures   . Darvon [Propoxyphene] Other (See Comments)    Increased heart rate per pt   . Diazepam Other (See Comments)    Reaction unknown  . Diflunisal Other (See Comments)    Reaction unknown  . Duloxetine Other (See Comments)    Reaction unknown  . Flexeril [Cyclobenzaprine] Other (See Comments)    Seizures   . Lidocaine  Other (See Comments)    Seizures  . Meperidine And Related Other (See Comments)    Reaction unknown  . Metanx [L-Methylfolate-Algae-B12-B6] Other (See Comments)    Reaction unknown  . Metoclopramide Other (See Comments)    Reaction unknown  . Morphine And Related Other (See Comments)    Stops heart per pt   . Naproxen   . Nuprin [Ibuprofen] Other (See Comments)    Reaction unknown  . Oxycodone Other (See Comments)    Hallucinations   . Penicillins Other (See Comments)    Stops heart per pt   . Propoxyphene Other (See Comments)    Reaction unknown  . Ranitidine Other (See Comments)    Reaction unknown  . Tylenol [Acetaminophen] Other (See Comments)    Reaction unknown  . Vicodin [Hydrocodone-Acetaminophen] Other (See Comments)    Seizures    Past Medical History  Diagnosis Date  . Seizures   . Bipolar 1 disorder   . Sinus arrest   . Pacemaker Medtronic     MRI compatible  . Hypotension   . Autoimmune hepatitis   . GERD (gastroesophageal reflux disease)   . Hypothyroidism    Current Outpatient Prescriptions on File Prior to Visit  Medication Sig Dispense Refill  . Cholecalciferol (VITAMIN D) 1000 UNITS capsule Take 1,000 Units by mouth daily.    . furosemide (LASIX) 20 MG tablet Take 1 tablet (20 mg total) by mouth daily. 90 tablet 3  .  Lactobacillus (LACTINEX) PACK Take 1 each by mouth daily as needed (for lactose intolerance).     . risperiDONE (RISPERDAL) 3 MG tablet Take 3 mg by mouth at bedtime.     No current facility-administered medications on file prior to visit.   Family History  Problem Relation Age of Onset  . Heart attack Father   . Heart disease Paternal Uncle   . Heart disease Paternal Grandmother   . Diabetes Paternal Grandmother   . Heart disease Paternal Grandfather   . Cancer Mother     BREAST  . Diabetes Sister   . Diabetes Brother   . Cancer Maternal Grandmother   . CVA Maternal Grandmother   . Cancer Maternal Grandfather   . CVA  Maternal Grandfather   . Diabetes Brother    History   Social History  . Marital Status: Married    Spouse Name: N/A  . Number of Children: N/A  . Years of Education: N/A   Occupational History  . Not on file.   Social History Main Topics  . Smoking status: Never Smoker   . Smokeless tobacco: Not on file  . Alcohol Use: No  . Drug Use: No  . Sexual Activity: Not on file   Other Topics Concern  . Not on file   Social History Narrative    Review of Systems: Constitutional: Negative for fever, chills, diaphoresis, activity change, appetite change and fatigue. HENT: Negative for ear pain, nosebleeds, congestion, facial swelling, rhinorrhea, neck pain, neck stiffness and ear discharge.  Eyes: Negative for pain, discharge, redness, itching and visual disturbance. Respiratory: Negative for cough, choking, chest tightness, shortness of breath, wheezing and stridor.  Cardiovascular: Negative for chest pain, palpitations and leg swelling. Gastrointestinal: Negative for abdominal distention. Genitourinary: Negative for dysuria, urgency, frequency, hematuria, flank pain, decreased urine volume, difficulty urinating and dyspareunia.  Musculoskeletal: Negative for back pain, joint swelling, arthralgia and gait problem. Neurological: Negative for dizziness, tremors, seizures, syncope, facial asymmetry, speech difficulty, weakness, light-headedness, numbness and headaches.  Hematological: Negative for adenopathy. Does not bruise/bleed easily. Psychiatric/Behavioral: Negative for hallucinations, behavioral problems, confusion, dysphoric mood, decreased concentration and agitation.    Objective:   Filed Vitals:   05/10/14 1041  BP: 136/73  Pulse: 110  Temp: 98.6 F (37 C)  Resp: 20    Physical Exam: Constitutional: Patient appears well-developed and well-nourished. No distress. HENT: Normocephalic, atraumatic, External right and left ear normal. Oropharynx is clear and moist.    Eyes: Conjunctivae and EOM are normal. PERRLA, no scleral icterus. Neck: Normal ROM. Neck supple. No JVD. No tracheal deviation. No thyromegaly. CVS: RRR, S1/S2 +, no murmurs, no gallops, no carotid bruit. S/c palpable pacemaker Pulmonary: Effort and breath sounds normal, no stridor, rhonchi, wheezes, rales.  Abdominal: Soft. BS +, no distension, tenderness, rebound or guarding.  Musculoskeletal: Normal range of motion. No edema and no tenderness.  Neuro: Alert. Normal reflexes, muscle tone coordination. No cranial nerve deficit. Skin: Skin is warm and dry. No rash noted. Not diaphoretic. No erythema. No pallor. Psychiatric: Normal mood and affect. Behavior, judgment, thought content normal.  Lab Results  Component Value Date   WBC 2.4* 10/31/2013   HGB 13.4 10/31/2013   HCT 38.8 10/31/2013   MCV 91.9 10/31/2013   PLT 124* 10/31/2013   Lab Results  Component Value Date   CREATININE 0.66 04/16/2014   BUN 10 04/16/2014   NA 129* 04/16/2014   K 3.5 04/16/2014   CL 102 04/16/2014   CO2 21 04/16/2014  No results found for: HGBA1C Lipid Panel  No results found for: CHOL, TRIG, HDL, CHOLHDL, VLDL, LDLCALC     Assessment and plan:   1. Other specified hypothyroidism Currently patient is on levothyroxine 100 mcg daily, recheck TSH level. - levothyroxine (SYNTHROID, LEVOTHROID) 100 MCG tablet; Take 1 tablet (100 mcg total) by mouth daily before breakfast.  Dispense: 30 tablet; Refill: 3 - TSH  2. Mood disorder Patient is on Seroquel and being followed up by her psychiatrist.  3. Pacemaker Medtronic As per patient she is taking Lasix prescribed by her cardiologist and is scheduled to do blood work today.  4. Autoimmune hepatitis I advised patient to follow up her with her specialist, she is given refill on her medication  - azaTHIOprine (IMURAN) 50 MG tablet; Take 2 tablets (100 mg total) by mouth daily.  Dispense: 60 tablet; Refill: 3  5. Convulsions, unspecified  convulsion type  - levETIRAcetam (KEPPRA) 250 MG tablet; Take 1 tablet (250 mg total) by mouth 2 (two) times daily.  Dispense: 30 tablet; Refill: 3  6. BPPV (benign paroxysmal positional vertigo), unspecified laterality  - meclizine (ANTIVERT) 12.5 MG tablet; Take 1 tablet (12.5 mg total) by mouth 3 (three) times daily as needed for dizziness.  Dispense: 30 tablet; Refill: 2    Return in about 3 months (around 08/09/2014), or if symptoms worsen or fail to improve, for hypothyroid.    The patient was given clear instructions to go to ER or return to medical center if symptoms don't improve, worsen or new problems develop. The patient verbalized understanding. The patient was told to call to get lab results if they haven't heard anything in the next week.    This note has been created with Surveyor, quantity. Any transcriptional errors are unintentional.   Lorayne Marek, MD

## 2014-05-15 ENCOUNTER — Telehealth: Payer: Self-pay

## 2014-05-15 NOTE — Telephone Encounter (Signed)
Patient not available Unable to leave message No machine for phone line

## 2014-05-15 NOTE — Telephone Encounter (Signed)
-----   Message from Stacey Marek, MD sent at 05/14/2014  9:17 AM EDT ----- Call and let the patient know that her TSH level is in normal range, patient to continue with current dose of levothyroxine.

## 2014-05-23 ENCOUNTER — Encounter: Payer: Self-pay | Admitting: *Deleted

## 2014-05-23 DIAGNOSIS — F411 Generalized anxiety disorder: Secondary | ICD-10-CM | POA: Diagnosis not present

## 2014-05-23 DIAGNOSIS — F312 Bipolar disorder, current episode manic severe with psychotic features: Secondary | ICD-10-CM | POA: Diagnosis not present

## 2014-05-23 DIAGNOSIS — F319 Bipolar disorder, unspecified: Secondary | ICD-10-CM | POA: Diagnosis not present

## 2014-06-03 ENCOUNTER — Telehealth: Payer: Self-pay | Admitting: Internal Medicine

## 2014-06-03 NOTE — Telephone Encounter (Signed)
Patient called to request a refill for meclizine (ANTIVERT) 12.5 MG tablet, she stated that the script was lost and needs more medication. Please f/u with pt.

## 2014-06-04 ENCOUNTER — Other Ambulatory Visit: Payer: Self-pay | Admitting: *Deleted

## 2014-06-04 DIAGNOSIS — H8110 Benign paroxysmal vertigo, unspecified ear: Secondary | ICD-10-CM

## 2014-06-04 MED ORDER — MECLIZINE HCL 12.5 MG PO TABS
12.5000 mg | ORAL_TABLET | Freq: Three times a day (TID) | ORAL | Status: DC | PRN
Start: 2014-06-04 — End: 2014-08-02

## 2014-06-04 NOTE — Telephone Encounter (Signed)
Patient requesting 90 day supply on her Meclizine-sent to [facility] on [address]

## 2014-06-11 NOTE — Addendum Note (Signed)
Addended by: Stanton Kidney on: 06/11/2014 10:29 AM   Modules accepted: Level of Service, SmartSet

## 2014-06-11 NOTE — Progress Notes (Signed)
This encounter was created in error - please disregard.

## 2014-07-02 ENCOUNTER — Ambulatory Visit (INDEPENDENT_AMBULATORY_CARE_PROVIDER_SITE_OTHER): Payer: Medicare Other | Admitting: *Deleted

## 2014-07-02 DIAGNOSIS — I495 Sick sinus syndrome: Secondary | ICD-10-CM | POA: Diagnosis not present

## 2014-07-02 NOTE — Progress Notes (Signed)
Remote pacemaker transmission.   

## 2014-07-05 LAB — CUP PACEART REMOTE DEVICE CHECK
Battery Remaining Longevity: 101 mo
Battery Voltage: 3.02 V
Brady Statistic AP VP Percent: 0.01 %
Brady Statistic AP VS Percent: 11.1 %
Brady Statistic AS VP Percent: 0.07 %
Brady Statistic AS VS Percent: 88.82 %
Brady Statistic RA Percent Paced: 11.11 %
Brady Statistic RV Percent Paced: 0.08 %
Date Time Interrogation Session: 20160607190000
Lead Channel Impedance Value: 342 Ohm
Lead Channel Impedance Value: 399 Ohm
Lead Channel Impedance Value: 475 Ohm
Lead Channel Impedance Value: 532 Ohm
Lead Channel Pacing Threshold Amplitude: 0.375 V
Lead Channel Pacing Threshold Amplitude: 0.75 V
Lead Channel Pacing Threshold Pulse Width: 0.4 ms
Lead Channel Pacing Threshold Pulse Width: 0.4 ms
Lead Channel Sensing Intrinsic Amplitude: 1.75 mV
Lead Channel Sensing Intrinsic Amplitude: 1.75 mV
Lead Channel Sensing Intrinsic Amplitude: 4.5 mV
Lead Channel Sensing Intrinsic Amplitude: 4.5 mV
Lead Channel Setting Pacing Amplitude: 2 V
Lead Channel Setting Pacing Amplitude: 2.5 V
Lead Channel Setting Pacing Pulse Width: 0.4 ms
Lead Channel Setting Sensing Sensitivity: 0.6 mV
Zone Setting Detection Interval: 350 ms
Zone Setting Detection Interval: 400 ms

## 2014-07-12 NOTE — Addendum Note (Signed)
Addended by: Stanton Kidney on: 07/12/2014 10:24 AM   Modules accepted: Level of Service, SmartSet

## 2014-07-12 NOTE — Progress Notes (Signed)
This encounter was created in error - please disregard.

## 2014-07-30 ENCOUNTER — Encounter: Payer: Self-pay | Admitting: Internal Medicine

## 2014-08-02 ENCOUNTER — Encounter: Payer: Self-pay | Admitting: Internal Medicine

## 2014-08-02 ENCOUNTER — Ambulatory Visit: Payer: Medicare Other | Attending: Internal Medicine | Admitting: Internal Medicine

## 2014-08-02 VITALS — BP 106/75 | HR 95 | Temp 97.8°F | Resp 18 | Ht 69.0 in | Wt 207.6 lb

## 2014-08-02 DIAGNOSIS — K754 Autoimmune hepatitis: Secondary | ICD-10-CM

## 2014-08-02 DIAGNOSIS — H8110 Benign paroxysmal vertigo, unspecified ear: Secondary | ICD-10-CM | POA: Diagnosis not present

## 2014-08-02 DIAGNOSIS — R569 Unspecified convulsions: Secondary | ICD-10-CM

## 2014-08-02 DIAGNOSIS — E038 Other specified hypothyroidism: Secondary | ICD-10-CM

## 2014-08-02 MED ORDER — LEVETIRACETAM 250 MG PO TABS: 250.0000 mg | ORAL_TABLET | Freq: Two times a day (BID) | ORAL | Status: DC

## 2014-08-02 MED ORDER — AZATHIOPRINE 50 MG PO TABS: 100.0000 mg | ORAL_TABLET | Freq: Every day | ORAL | Status: DC

## 2014-08-02 MED ORDER — LEVOTHYROXINE SODIUM 100 MCG PO TABS: 100.0000 ug | ORAL_TABLET | Freq: Every day | ORAL | Status: DC

## 2014-08-02 MED ORDER — MECLIZINE HCL 12.5 MG PO TABS: 12.5000 mg | ORAL_TABLET | Freq: Three times a day (TID) | ORAL | Status: DC | PRN

## 2014-08-02 NOTE — Progress Notes (Signed)
MRN: [id number] Name: DEJANAE NEYRA  Sex: female Age: 69 y.o. DOB: 1946/01/25  Allergies: Ambien; Amitriptyline; Ampicillin; Anaprox; Benadryl; Cetirizine & related; Cortizone-10; Darvon; Diazepam; Diflunisal; Duloxetine; Flexeril; Lidocaine; Meperidine and related; Metanx; Metoclopramide; Morphine and related; Naproxen; Nuprin; Oxycodone; Penicillins; Propoxyphene; Ranitidine; Tylenol; and Vicodin  Chief Complaint  Patient presents with  . Medication Refill    HPI: Patient is 69 y.o. female who has history of hypothyroidism, bipolar disorder, autoimmune hepatitis, cardiac pacemaker following up with cardiology, history of chronic BPPV and is taking meclizine when necessary, today she's requesting refill on her medications history of seizures currently on Keppra as per patient for the last few days she ran out of her Keppra and denies any recent seizures. In the past she was also following up with hepatologist in [facility] in the past and has been on Imuran. Patient denies any acute symptoms has been compliant in taking her meds.   Past Medical History  Diagnosis Date  . Seizures   . Bipolar 1 disorder   . Sinus arrest   . Pacemaker Medtronic     MRI compatible  . Hypotension   . Autoimmune hepatitis   . GERD (gastroesophageal reflux disease)   . Hypothyroidism     Past Surgical History  Procedure Laterality Date  . Pacemaker insertion        Medication List       This list is accurate as of: 08/02/14 11:50 AM.  Always use your most recent med list.               azaTHIOprine 50 MG tablet  Commonly known as:  IMURAN  Take 2 tablets (100 mg total) by mouth daily.     furosemide 20 MG tablet  Commonly known as:  LASIX  Take 1 tablet (20 mg total) by mouth daily.     ketoprofen 75 MG capsule  Commonly known as:  ORUDIS  Take 1 capsule (75 mg total) by mouth 3 (three) times daily as needed (for headache).     LACTINEX Pack  Take 1 each by mouth daily as needed  (for lactose intolerance).     levETIRAcetam 250 MG tablet  Commonly known as:  KEPPRA  Take 1 tablet (250 mg total) by mouth 2 (two) times daily.     levothyroxine 100 MCG tablet  Commonly known as:  SYNTHROID, LEVOTHROID  Take 1 tablet (100 mcg total) by mouth daily before breakfast.     meclizine 12.5 MG tablet  Commonly known as:  ANTIVERT  Take 1 tablet (12.5 mg total) by mouth 3 (three) times daily as needed for dizziness.     risperiDONE 3 MG tablet  Commonly known as:  RISPERDAL  Take 3 mg by mouth at bedtime.     Vitamin D 1000 UNITS capsule  Take 1,000 Units by mouth daily.        Meds ordered this encounter  Medications  . levETIRAcetam (KEPPRA) 250 MG tablet    Sig: Take 1 tablet (250 mg total) by mouth 2 (two) times daily.    Dispense:  60 tablet    Refill:  4  . azaTHIOprine (IMURAN) 50 MG tablet    Sig: Take 2 tablets (100 mg total) by mouth daily.    Dispense:  60 tablet    Refill:  4  . levothyroxine (SYNTHROID, LEVOTHROID) 100 MCG tablet    Sig: Take 1 tablet (100 mcg total) by mouth daily before breakfast.    Dispense:  30 tablet    Refill:  4  . meclizine (ANTIVERT) 12.5 MG tablet    Sig: Take 1 tablet (12.5 mg total) by mouth 3 (three) times daily as needed for dizziness.    Dispense:  90 tablet    Refill:  2     There is no immunization history on file for this patient.  Family History  Problem Relation Age of Onset  . Heart attack Father   . Heart disease Paternal Uncle   . Heart disease Paternal Grandmother   . Diabetes Paternal Grandmother   . Heart disease Paternal Grandfather   . Cancer Mother     BREAST  . Diabetes Sister   . Diabetes Brother   . Cancer Maternal Grandmother   . CVA Maternal Grandmother   . Cancer Maternal Grandfather   . CVA Maternal Grandfather   . Diabetes Brother     History  Substance Use Topics  . Smoking status: Never Smoker   . Smokeless tobacco: Not on file  . Alcohol Use: No    Review of  Systems   As noted in HPI  Filed Vitals:   08/02/14 0936  BP: 106/75  Pulse: 95  Temp: 97.8 F (36.6 C)  Resp: 18    Physical Exam  Physical Exam  Constitutional: No distress.  Eyes: EOM are normal. Pupils are equal, round, and reactive to light.  Cardiovascular: Normal rate and regular rhythm.   Pulmonary/Chest: No respiratory distress. She has no wheezes. She has no rales.  Musculoskeletal: She exhibits no edema.    CBC    Component Value Date/Time   WBC 2.4* 10/31/2013 0900   RBC 4.22 10/31/2013 0900   HGB 13.4 10/31/2013 0900   HCT 38.8 10/31/2013 0900   PLT 124* 10/31/2013 0900   MCV 91.9 10/31/2013 0900   LYMPHSABS 0.6* 10/31/2013 0900   MONOABS 0.2 10/31/2013 0900   EOSABS 0.0 10/31/2013 0900   BASOSABS 0.0 10/31/2013 0900    CMP     Component Value Date/Time   NA 131* 05/10/2014 1521   K 3.6 05/10/2014 1521   CL 103 05/10/2014 1521   CO2 24 05/10/2014 1521   GLUCOSE 127* 05/10/2014 1521   BUN 10 05/10/2014 1521   CREATININE 0.68 05/10/2014 1521   CALCIUM 8.9 05/10/2014 1521   PROT 7.7 02/21/2013 1131   ALBUMIN 2.7* 02/21/2013 1131   AST 32 02/21/2013 1131   ALT 15 02/21/2013 1131   ALKPHOS 71 02/21/2013 1131   BILITOT 0.4 02/21/2013 1131   GFRNONAA >90 10/31/2013 0900   GFRAA >90 10/31/2013 0900    No results found for: CHOL  No results found for: HGBA1C  Lab Results  Component Value Date/Time   AST 32 02/21/2013 11:31 AM    Assessment and Plan  Other specified hypothyroidism - Plan: last TSH level was in normal range, continue with current dose  levothyroxine (SYNTHROID, LEVOTHROID) 100 MCG tablet, will repeat the level on the following visit.  Autoimmune hepatitis - Plan: azaTHIOprine (IMURAN) 50 MG tablet  Convulsions, unspecified convulsion type - Plan: levETIRAcetam (KEPPRA) 250 MG tablet  BPPV (benign paroxysmal positional vertigo), unspecified laterality - Plan: meclizine (ANTIVERT) 12.5 MG tablet    No Follow-up on  file.   This note has been created with Surveyor, quantity. Any transcriptional errors are unintentional.    Lorayne Marek, MD

## 2014-08-02 NOTE — Progress Notes (Signed)
Patient here requesting refills on medications.  Patient needs refill on Azathioprine, Cholecalciferol, Furosemide, Levetiracetam, Levothyroxine, and Meclizine. Patient states she takes Azathioprine, two 50mg  tabs in the morning.  Patient requests that written on the script instead of "one in the morning, one in the evening." Patient also requesting note for group home stating that she can manage her own medications.

## 2014-08-21 ENCOUNTER — Emergency Department (HOSPITAL_COMMUNITY): Payer: Medicare Other

## 2014-08-21 ENCOUNTER — Inpatient Hospital Stay (HOSPITAL_COMMUNITY): Payer: Medicare Other

## 2014-08-21 ENCOUNTER — Inpatient Hospital Stay (HOSPITAL_COMMUNITY)
Admission: EM | Admit: 2014-08-21 | Discharge: 2014-08-25 | DRG: 312 | Disposition: A | Discharge status: Home / Self Care | Payer: Medicare Other | Attending: Internal Medicine | Admitting: Internal Medicine

## 2014-08-21 ENCOUNTER — Encounter (HOSPITAL_COMMUNITY): Payer: Self-pay | Admitting: Emergency Medicine

## 2014-08-21 DIAGNOSIS — D702 Other drug-induced agranulocytosis: Secondary | ICD-10-CM | POA: Diagnosis present

## 2014-08-21 DIAGNOSIS — R112 Nausea with vomiting, unspecified: Secondary | ICD-10-CM | POA: Diagnosis present

## 2014-08-21 DIAGNOSIS — R0902 Hypoxemia: Secondary | ICD-10-CM | POA: Diagnosis present

## 2014-08-21 DIAGNOSIS — Z880 Allergy status to penicillin: Secondary | ICD-10-CM

## 2014-08-21 DIAGNOSIS — K754 Autoimmune hepatitis: Secondary | ICD-10-CM | POA: Diagnosis not present

## 2014-08-21 DIAGNOSIS — R1110 Vomiting, unspecified: Secondary | ICD-10-CM | POA: Diagnosis not present

## 2014-08-21 DIAGNOSIS — D696 Thrombocytopenia, unspecified: Secondary | ICD-10-CM | POA: Diagnosis not present

## 2014-08-21 DIAGNOSIS — J9602 Acute respiratory failure with hypercapnia: Secondary | ICD-10-CM | POA: Diagnosis not present

## 2014-08-21 DIAGNOSIS — N3090 Cystitis, unspecified without hematuria: Secondary | ICD-10-CM

## 2014-08-21 DIAGNOSIS — R40242 Glasgow coma scale score 9-12: Secondary | ICD-10-CM | POA: Diagnosis not present

## 2014-08-21 DIAGNOSIS — E876 Hypokalemia: Secondary | ICD-10-CM | POA: Diagnosis present

## 2014-08-21 DIAGNOSIS — D72819 Decreased white blood cell count, unspecified: Secondary | ICD-10-CM | POA: Diagnosis present

## 2014-08-21 DIAGNOSIS — R55 Syncope and collapse: Secondary | ICD-10-CM | POA: Diagnosis not present

## 2014-08-21 DIAGNOSIS — Z79899 Other long term (current) drug therapy: Secondary | ICD-10-CM | POA: Diagnosis not present

## 2014-08-21 DIAGNOSIS — D6959 Other secondary thrombocytopenia: Secondary | ICD-10-CM | POA: Diagnosis present

## 2014-08-21 DIAGNOSIS — A419 Sepsis, unspecified organism: Secondary | ICD-10-CM | POA: Diagnosis not present

## 2014-08-21 DIAGNOSIS — J9811 Atelectasis: Secondary | ICD-10-CM | POA: Diagnosis not present

## 2014-08-21 DIAGNOSIS — Z888 Allergy status to other drugs, medicaments and biological substances status: Secondary | ICD-10-CM | POA: Diagnosis not present

## 2014-08-21 DIAGNOSIS — I959 Hypotension, unspecified: Secondary | ICD-10-CM | POA: Diagnosis present

## 2014-08-21 DIAGNOSIS — E039 Hypothyroidism, unspecified: Secondary | ICD-10-CM | POA: Diagnosis present

## 2014-08-21 DIAGNOSIS — I6930 Unspecified sequelae of cerebral infarction: Secondary | ICD-10-CM | POA: Diagnosis not present

## 2014-08-21 DIAGNOSIS — Z978 Presence of other specified devices: Secondary | ICD-10-CM

## 2014-08-21 DIAGNOSIS — R233 Spontaneous ecchymoses: Secondary | ICD-10-CM | POA: Diagnosis present

## 2014-08-21 DIAGNOSIS — N390 Urinary tract infection, site not specified: Secondary | ICD-10-CM | POA: Diagnosis present

## 2014-08-21 DIAGNOSIS — K7290 Hepatic failure, unspecified without coma: Secondary | ICD-10-CM | POA: Diagnosis not present

## 2014-08-21 DIAGNOSIS — G9340 Encephalopathy, unspecified: Secondary | ICD-10-CM | POA: Diagnosis not present

## 2014-08-21 DIAGNOSIS — Z791 Long term (current) use of non-steroidal anti-inflammatories (NSAID): Secondary | ICD-10-CM | POA: Diagnosis not present

## 2014-08-21 DIAGNOSIS — Z4682 Encounter for fitting and adjustment of non-vascular catheter: Secondary | ICD-10-CM | POA: Diagnosis not present

## 2014-08-21 DIAGNOSIS — Z885 Allergy status to narcotic agent status: Secondary | ICD-10-CM

## 2014-08-21 DIAGNOSIS — T451X5A Adverse effect of antineoplastic and immunosuppressive drugs, initial encounter: Secondary | ICD-10-CM | POA: Diagnosis present

## 2014-08-21 DIAGNOSIS — Z950 Presence of cardiac pacemaker: Secondary | ICD-10-CM | POA: Diagnosis not present

## 2014-08-21 DIAGNOSIS — J9601 Acute respiratory failure with hypoxia: Secondary | ICD-10-CM | POA: Diagnosis not present

## 2014-08-21 DIAGNOSIS — D709 Neutropenia, unspecified: Secondary | ICD-10-CM | POA: Diagnosis not present

## 2014-08-21 DIAGNOSIS — D61818 Other pancytopenia: Secondary | ICD-10-CM | POA: Diagnosis present

## 2014-08-21 DIAGNOSIS — F319 Bipolar disorder, unspecified: Secondary | ICD-10-CM | POA: Diagnosis present

## 2014-08-21 DIAGNOSIS — G40909 Epilepsy, unspecified, not intractable, without status epilepticus: Secondary | ICD-10-CM | POA: Diagnosis present

## 2014-08-21 DIAGNOSIS — R569 Unspecified convulsions: Secondary | ICD-10-CM | POA: Diagnosis not present

## 2014-08-21 DIAGNOSIS — R4020 Unspecified coma: Secondary | ICD-10-CM | POA: Diagnosis not present

## 2014-08-21 DIAGNOSIS — T50905A Adverse effect of unspecified drugs, medicaments and biological substances, initial encounter: Secondary | ICD-10-CM | POA: Diagnosis present

## 2014-08-21 HISTORY — DX: Autoimmune hepatitis: K75.4

## 2014-08-21 LAB — BLOOD GAS, ARTERIAL
Acid-base deficit: 3.1 mmol/L — ABNORMAL HIGH (ref 0.0–2.0)
Bicarbonate: 20.5 mEq/L (ref 20.0–24.0)
Drawn by: 24610
FIO2: 1
MECHVT: 570 mL
O2 Saturation: 100 %
PEEP: 5 cmH2O
Patient temperature: 98.6
RATE: 15 resp/min
TCO2: 21.5 mmol/L (ref 0–100)
pCO2 arterial: 31.8 mmHg — ABNORMAL LOW (ref 35.0–45.0)
pH, Arterial: 7.426 (ref 7.350–7.450)
pO2, Arterial: 377 mmHg — ABNORMAL HIGH (ref 80.0–100.0)

## 2014-08-21 LAB — URINALYSIS, ROUTINE W REFLEX MICROSCOPIC
Bilirubin Urine: NEGATIVE
Glucose, UA: NEGATIVE mg/dL
Hgb urine dipstick: NEGATIVE
Ketones, ur: NEGATIVE mg/dL
Leukocytes, UA: NEGATIVE
Nitrite: POSITIVE — AB
Protein, ur: NEGATIVE mg/dL
Specific Gravity, Urine: 1.011 (ref 1.005–1.030)
Urobilinogen, UA: 1 mg/dL (ref 0.0–1.0)
pH: 7 (ref 5.0–8.0)

## 2014-08-21 LAB — DIC (DISSEMINATED INTRAVASCULAR COAGULATION) PANEL (NOT AT ARMC)
INR: 1.28 (ref 0.00–1.49)
aPTT: 33 seconds (ref 24–37)

## 2014-08-21 LAB — I-STAT TROPONIN, ED: Troponin i, poc: 0 ng/mL (ref 0.00–0.08)

## 2014-08-21 LAB — COMPREHENSIVE METABOLIC PANEL
ALT: 16 U/L (ref 14–54)
AST: 34 U/L (ref 15–41)
Albumin: 2.8 g/dL — ABNORMAL LOW (ref 3.5–5.0)
Alkaline Phosphatase: 61 U/L (ref 38–126)
Anion gap: 8 (ref 5–15)
BUN: 8 mg/dL (ref 6–20)
CO2: 21 mmol/L — ABNORMAL LOW (ref 22–32)
Calcium: 8.2 mg/dL — ABNORMAL LOW (ref 8.9–10.3)
Chloride: 106 mmol/L (ref 101–111)
Creatinine, Ser: 0.79 mg/dL (ref 0.44–1.00)
GFR calc Af Amer: >60 mL/min (ref >60)
GFR calc non Af Amer: >60 mL/min (ref >60)
Glucose, Bld: 107 mg/dL — ABNORMAL HIGH (ref 65–99)
Potassium: 3.9 mmol/L (ref 3.5–5.1)
Sodium: 135 mmol/L (ref 135–145)
Total Bilirubin: 0.6 mg/dL (ref 0.3–1.2)
Total Protein: 7.3 g/dL (ref 6.5–8.1)

## 2014-08-21 LAB — GLUCOSE, CAPILLARY
Glucose-Capillary: 108 mg/dL — ABNORMAL HIGH (ref 65–99)
Glucose-Capillary: 112 mg/dL — ABNORMAL HIGH (ref 65–99)
Glucose-Capillary: 120 mg/dL — ABNORMAL HIGH (ref 65–99)

## 2014-08-21 LAB — DIC (DISSEMINATED INTRAVASCULAR COAGULATION)PANEL
D-Dimer, Quant: 0.78 ug/mL-FEU — ABNORMAL HIGH (ref 0.00–0.48)
Fibrinogen: 221 mg/dL (ref 204–475)
Platelets: 69 10*3/uL — ABNORMAL LOW (ref 150–400)
Prothrombin Time: 16.1 seconds — ABNORMAL HIGH (ref 11.6–15.2)
Smear Review: NONE SEEN

## 2014-08-21 LAB — CBC
HCT: 39.5 % (ref 36.0–46.0)
Hemoglobin: 13.6 g/dL (ref 12.0–15.0)
MCH: 32.2 pg (ref 26.0–34.0)
MCHC: 34.4 g/dL (ref 30.0–36.0)
MCV: 93.4 fL (ref 78.0–100.0)
Platelets: 91 10*3/uL — ABNORMAL LOW (ref 150–400)
RBC: 4.23 MIL/uL (ref 3.87–5.11)
RDW: 14.8 % (ref 11.5–15.5)
WBC: 2 10*3/uL — ABNORMAL LOW (ref 4.0–10.5)

## 2014-08-21 LAB — AMYLASE: Amylase: 452 U/L — ABNORMAL HIGH (ref 28–100)

## 2014-08-21 LAB — URINE MICROSCOPIC-ADD ON

## 2014-08-21 LAB — LIPASE, BLOOD: Lipase: 20 U/L — ABNORMAL LOW (ref 22–51)

## 2014-08-21 LAB — MAGNESIUM: Magnesium: 1.7 mg/dL (ref 1.7–2.4)

## 2014-08-21 LAB — PHOSPHORUS: Phosphorus: 2.7 mg/dL (ref 2.5–4.6)

## 2014-08-21 LAB — D-DIMER, QUANTITATIVE: D-Dimer, Quant: 0.97 ug/mL-FEU — ABNORMAL HIGH (ref 0.00–0.48)

## 2014-08-21 LAB — PROCALCITONIN: Procalcitonin: <0.1 ng/mL

## 2014-08-21 LAB — LACTATE DEHYDROGENASE: LDH: 164 U/L (ref 98–192)

## 2014-08-21 LAB — I-STAT CG4 LACTIC ACID, ED: Lactic Acid, Venous: 2.29 mmol/L (ref 0.5–2.0)

## 2014-08-21 LAB — MRSA PCR SCREENING: MRSA by PCR: NEGATIVE

## 2014-08-21 LAB — PROTIME-INR
INR: 1.18 (ref 0.00–1.49)
Prothrombin Time: 15.1 seconds (ref 11.6–15.2)

## 2014-08-21 IMAGING — CT CT HEAD W/O CM
1 series · 16 of 30 positions shown, 20 images · non-contrast
Comparison: [DATE]

CLINICAL DATA: Unresponsive, hypotensive, history of seizures,
bipolar disorder

EXAM:
CT HEAD WITHOUT CONTRAST
TECHNIQUE: Contiguous axial images were obtained from the base of the skull
through the vertex without intravenous contrast.

[Series 2: head 5.0 h30s · axial · 0.47mm/px · z∈[-132,+18]mm · 16 of 34 slices shown, 20 images]
[im 2/34  brain]
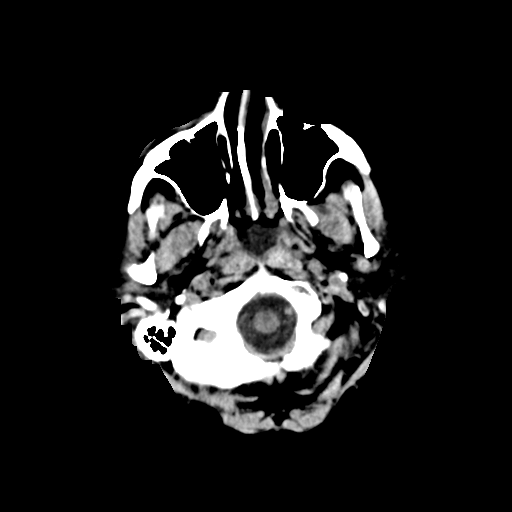
[im 2/34  bone]
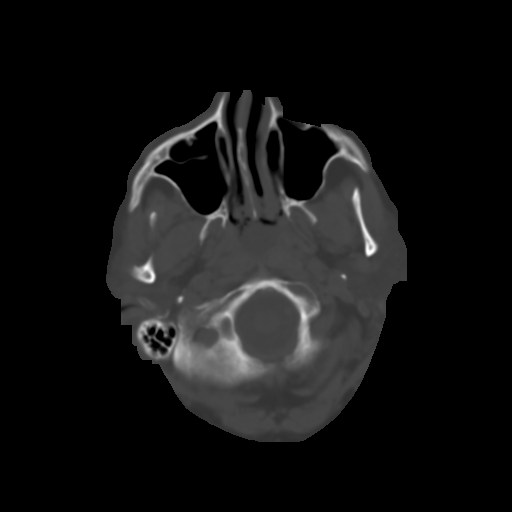
[im 4/34  brain]
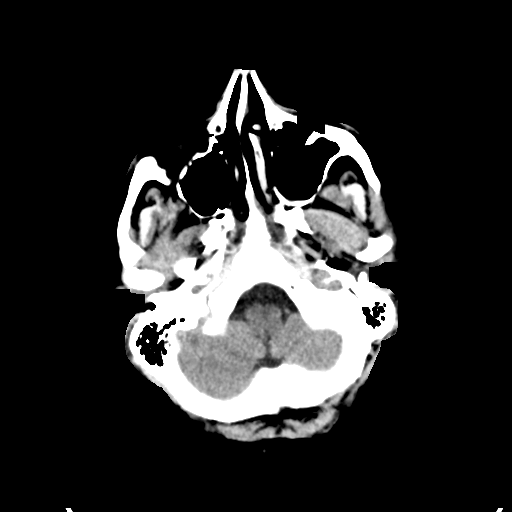
[im 6/34  brain]
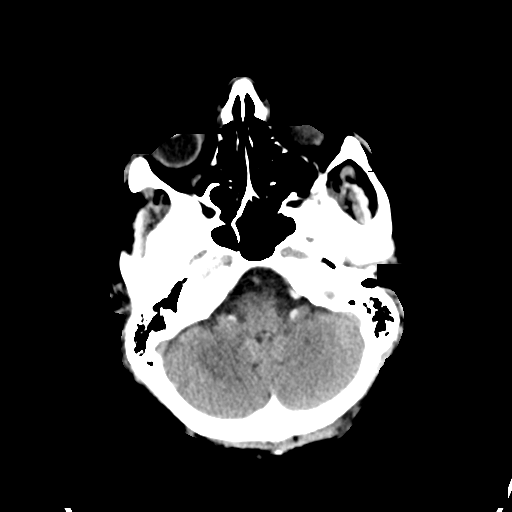
[im 8/34  brain]
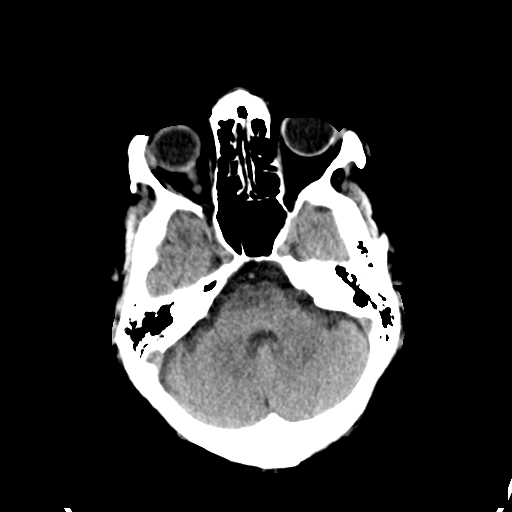
[im 10/34  brain]
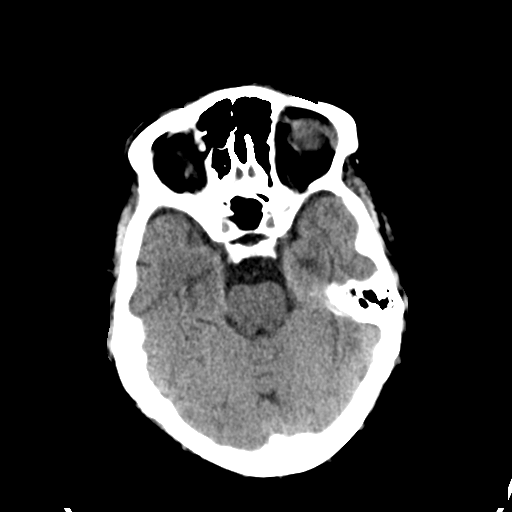
[im 10/34  bone]
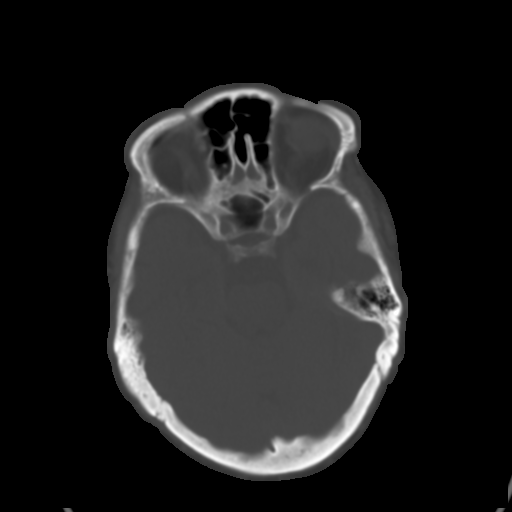
[im 12/34  brain]
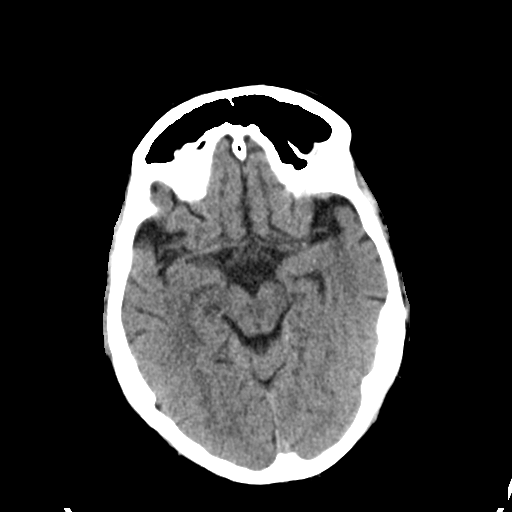
[im 14/34  brain]
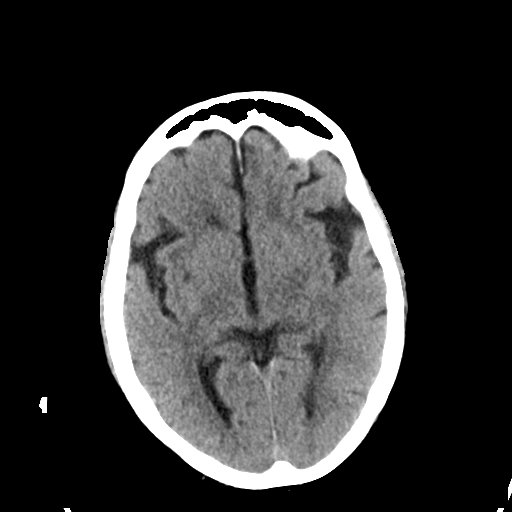
[im 16/34  brain]
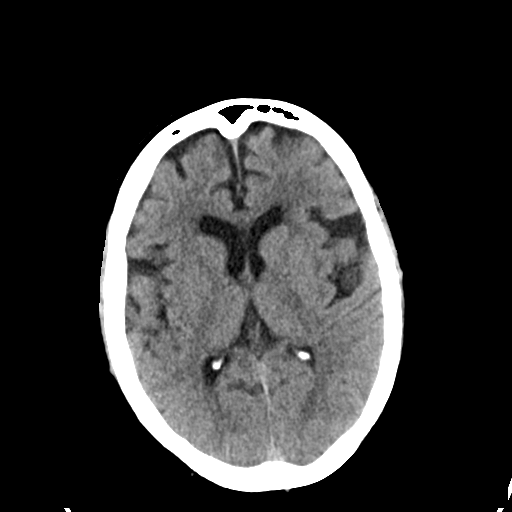
[im 18/34  brain]
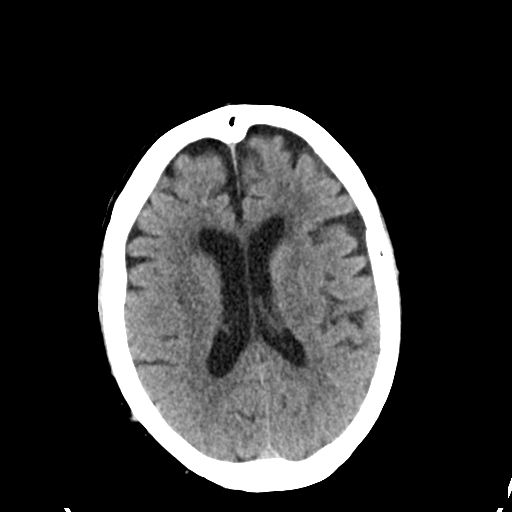
[im 18/34  bone]
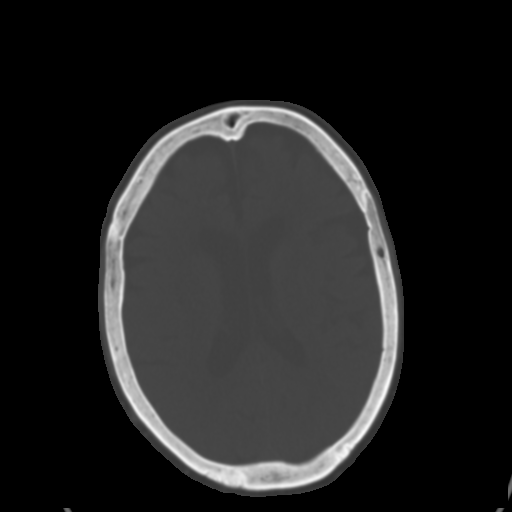
[im 20/34  brain]
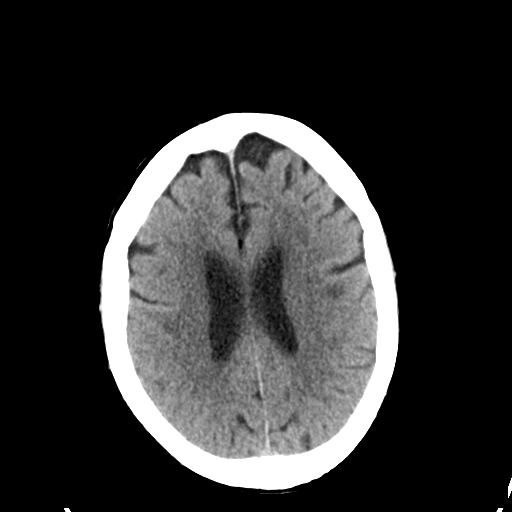
[im 22/34  brain]
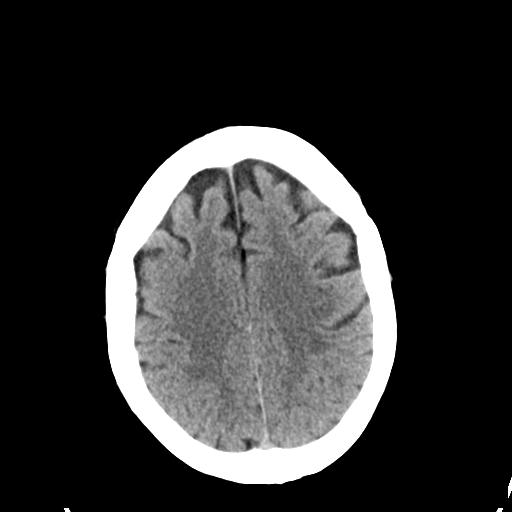
[im 24/34  brain]
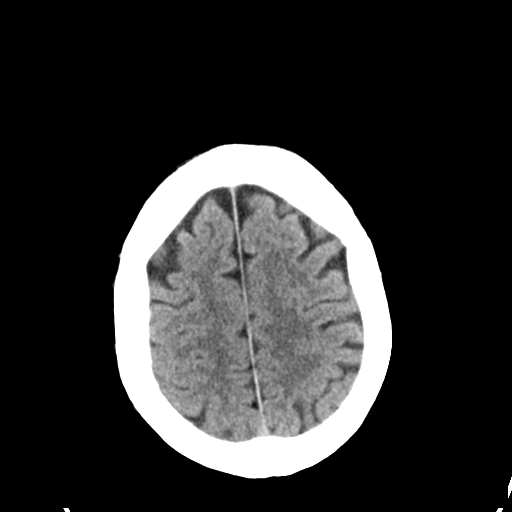
[im 26/34  brain]
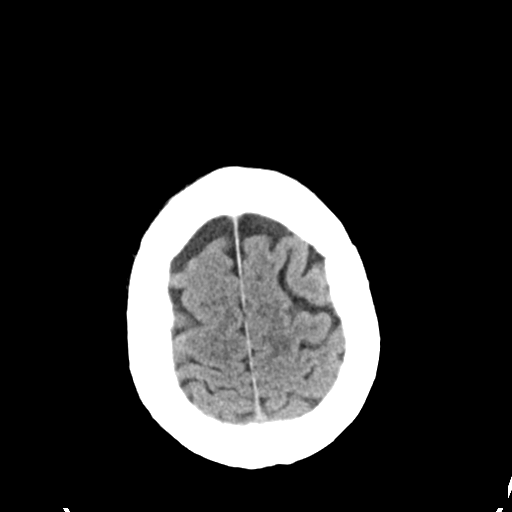
[im 26/34  bone]
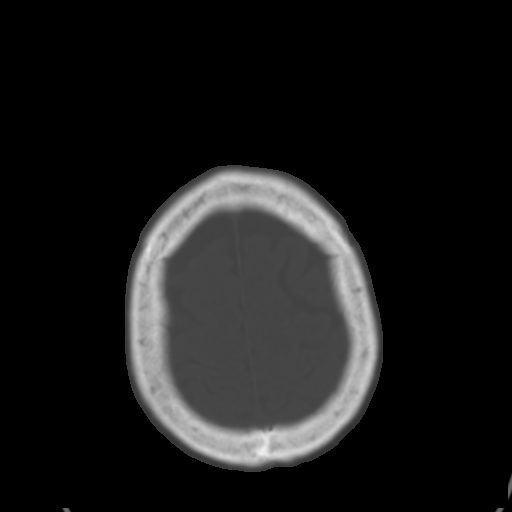
[im 28/34  brain]
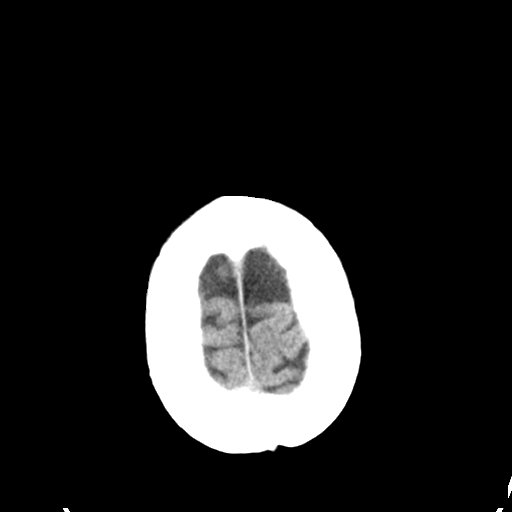
[im 30/34  brain]
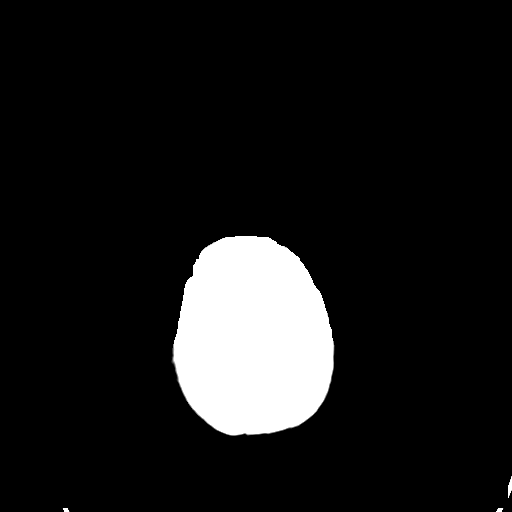
[im 32/34  brain]
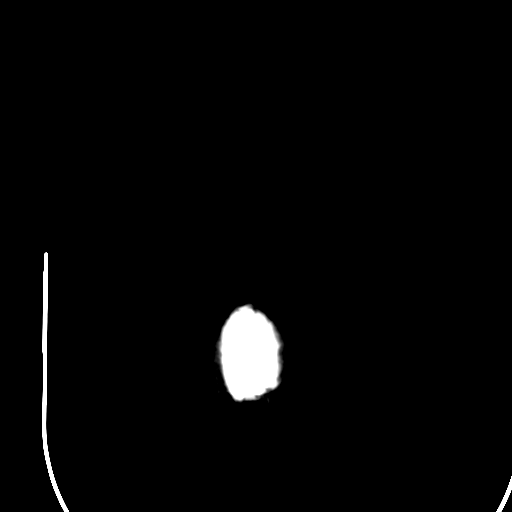

[16 of 30 positions shown; findings below may reference images not displayed]

FINDINGS: There is no evidence of mass effect, midline shift, or extra-axial
fluid collections. There is no evidence of a space-occupying lesion
or intracranial hemorrhage. There is no evidence of a cortical-based
area of acute infarction. There is generalized cerebral atrophy.
There is periventricular white matter low attenuation likely
secondary to microangiopathy.

The ventricles and sulci are appropriate for the patient's age. The
basal cisterns are patent.

Visualized portions of the orbits are unremarkable. The visualized
portions of the paranasal sinuses and mastoid air cells are
unremarkable.

The osseous structures are unremarkable.
IMPRESSION: 1. No acute intracranial pathology.
2. Chronic microvascular disease and cerebral atrophy.

## 2014-08-21 IMAGING — CR DG ABD PORTABLE 1V
1 series · 1 of 1 positions shown · non-contrast
Comparison: None.

CLINICAL DATA: Vomiting.

EXAM:
PORTABLE ABDOMEN - 1 VIEW

[AP]
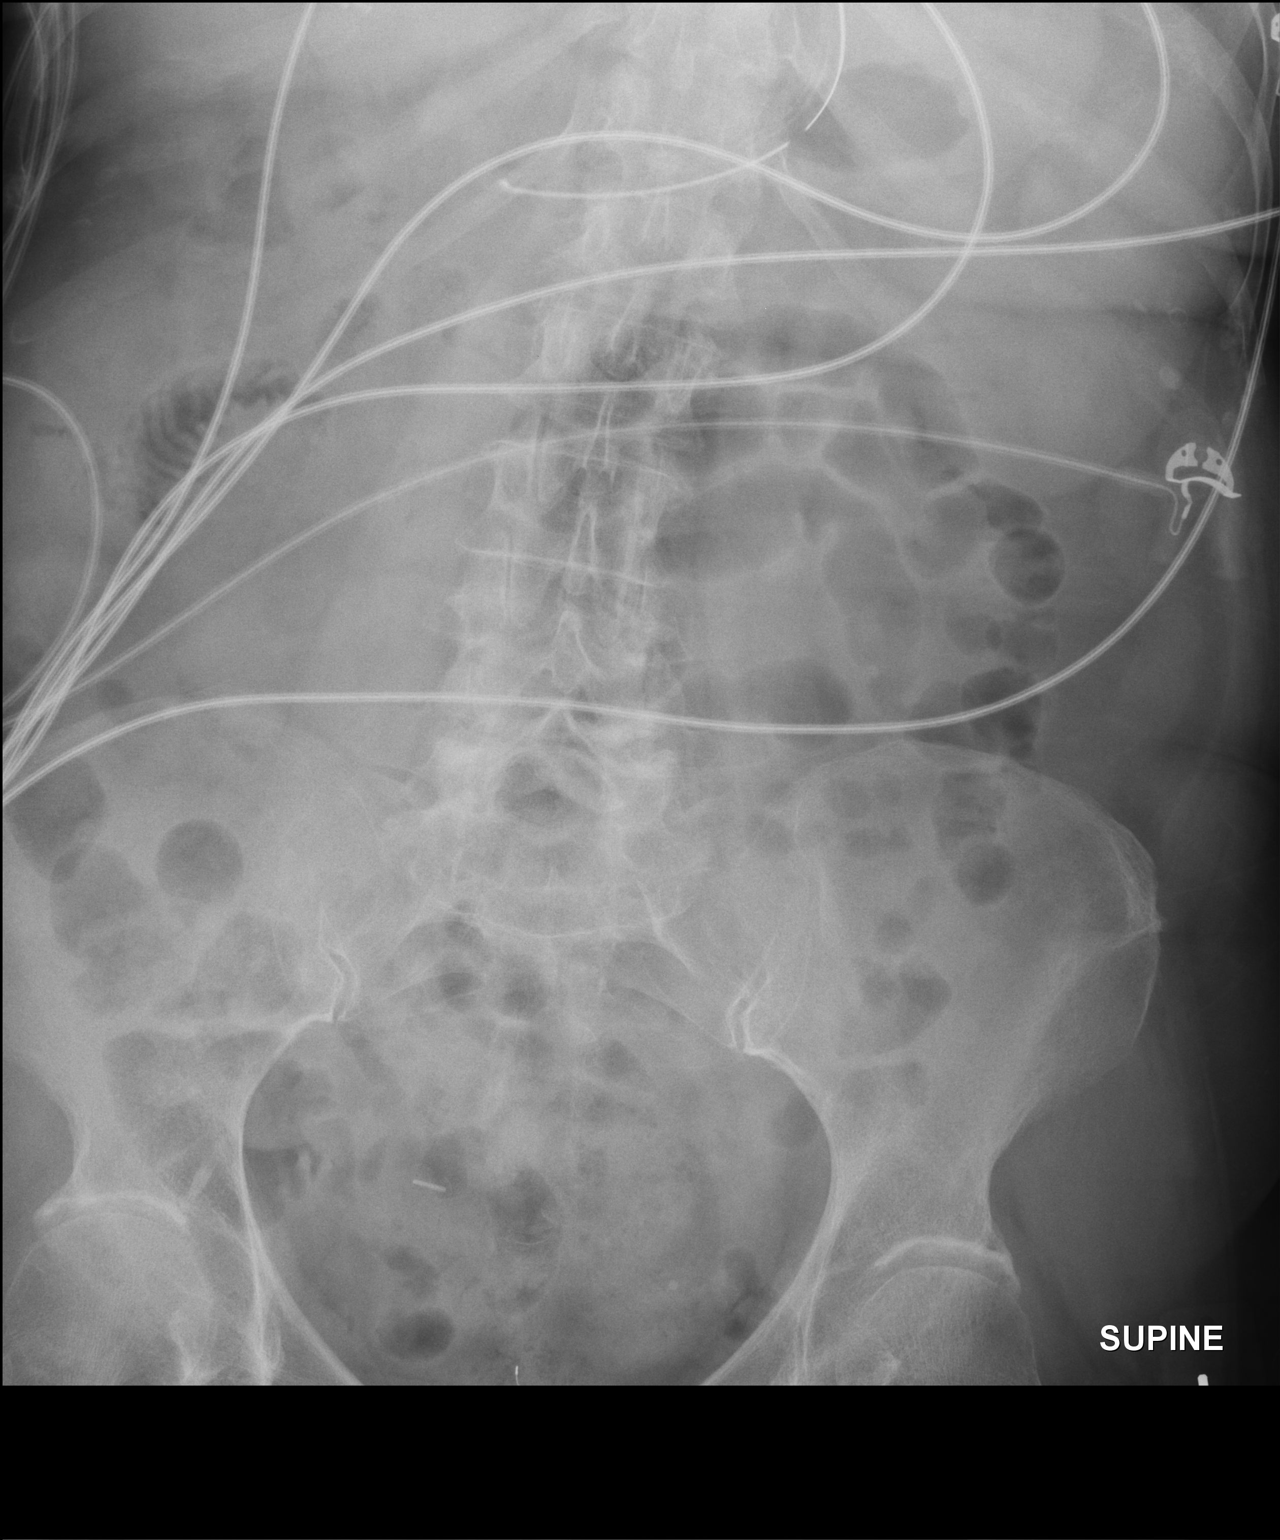

[1 of 1 positions shown; findings below may reference images not displayed]

FINDINGS: The bowel gas pattern is normal. Nasogastric tube tip is seen in
expected position of distal stomach. Surgical clips and phleboliths
are noted in the pelvis.
IMPRESSION: No evidence of bowel obstruction or ileus.

## 2014-08-21 IMAGING — DX DG CHEST 1V PORT
1 series · 1 of 1 positions shown · non-contrast
Comparison: None.

CLINICAL DATA: Intubation

EXAM:
PORTABLE CHEST - 1 VIEW

[chest ap]
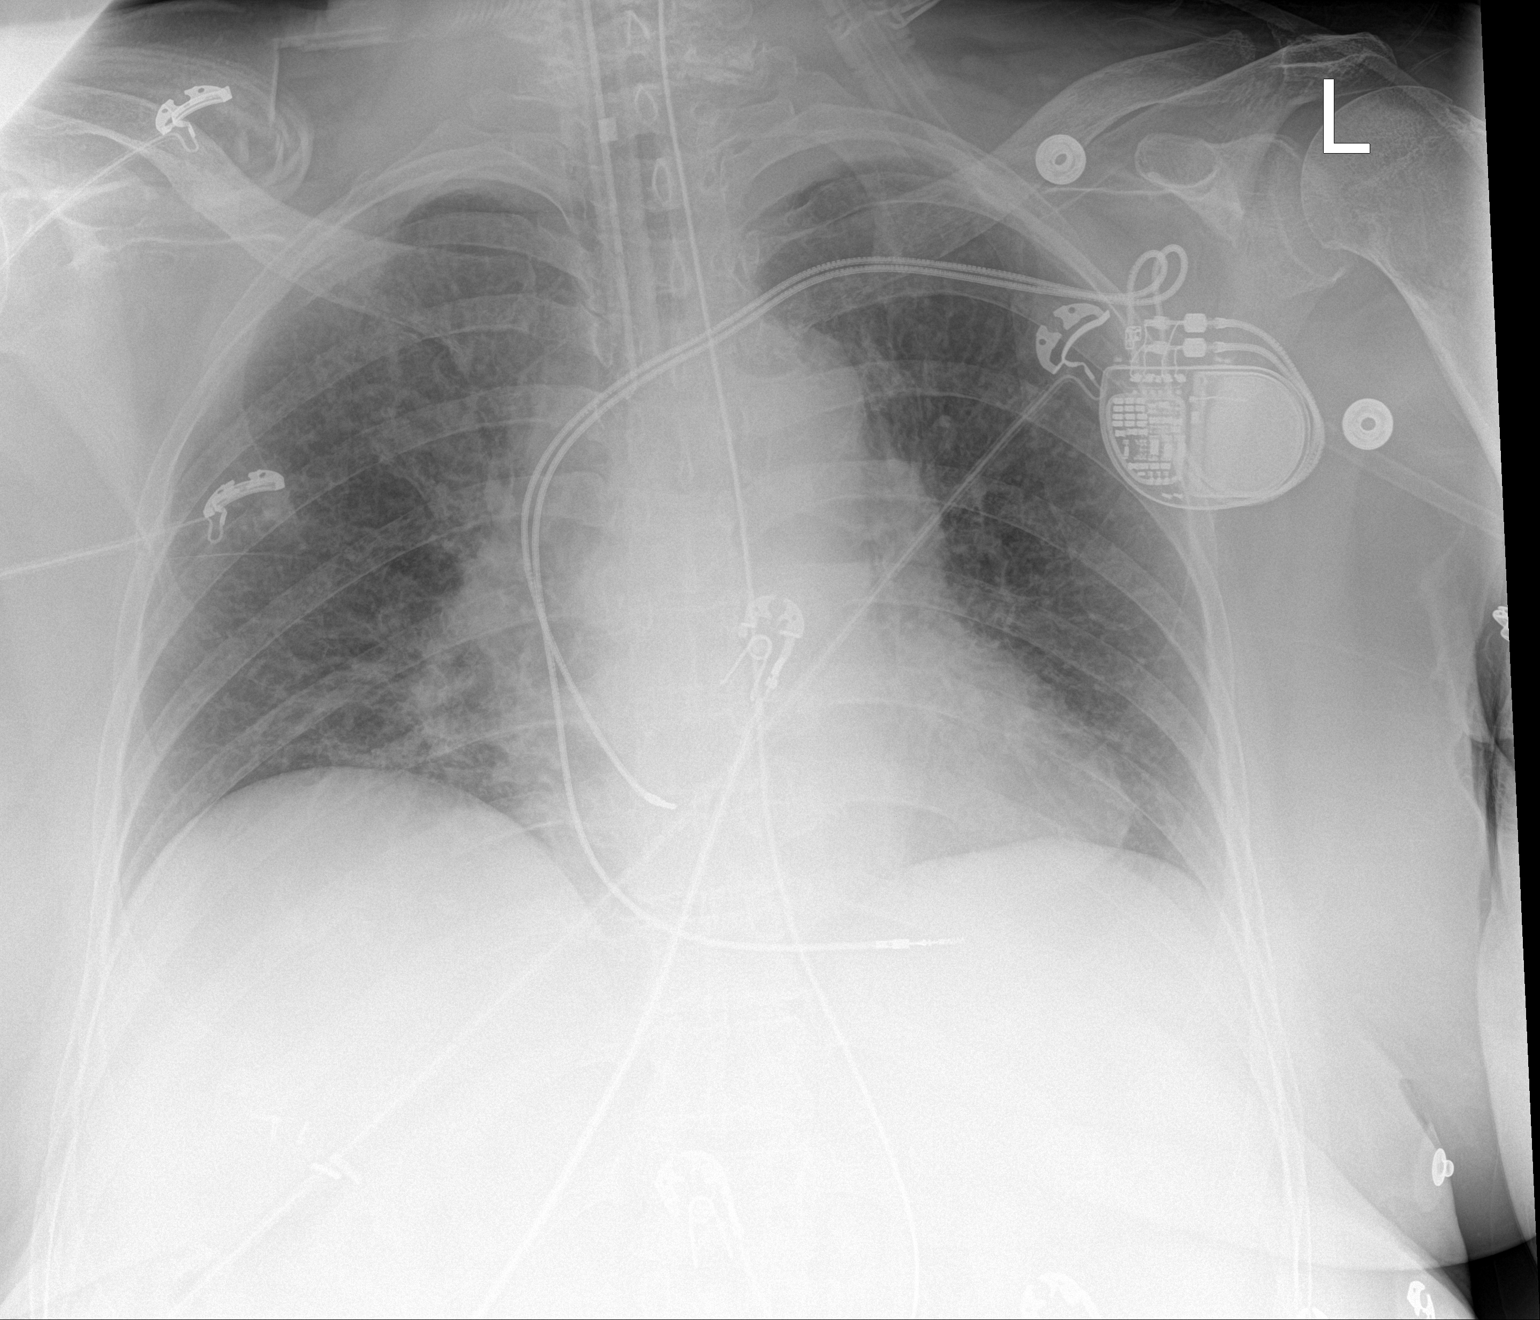

[1 of 1 positions shown; findings below may reference images not displayed]

FINDINGS: Lungs are under aerated with bibasilar atelectasis. Endotracheal
tube in place with its tip 2.0 cm from the carina. NG tube in place.
Tip is beyond the gastroesophageal junction. Upper lungs clear.
Normal heart size.
IMPRESSION: Endotracheal and NG tubes as described.

Bibasilar atelectasis.

## 2014-08-21 MED ORDER — SODIUM CHLORIDE 0.9 % IV BOLUS (SEPSIS)
1000.0000 mL | INTRAVENOUS | Status: DC
  Administered 2014-08-21 (×2): 1000 mL via INTRAVENOUS

## 2014-08-21 MED ORDER — ASPIRIN 300 MG RE SUPP: 300.0000 mg | RECTAL | Status: AC

## 2014-08-21 MED ORDER — ROCURONIUM BROMIDE 50 MG/5ML IV SOLN
INTRAVENOUS | Status: AC
  Administered 2014-08-21: 95 mg
  Filled 2014-08-21: qty 2

## 2014-08-21 MED ORDER — ETOMIDATE 2 MG/ML IV SOLN
INTRAVENOUS | Status: AC
  Administered 2014-08-21: 30 mg
  Filled 2014-08-21: qty 20

## 2014-08-21 MED ORDER — HEPARIN SODIUM (PORCINE) 5000 UNIT/ML IJ SOLN
5000.0000 [IU] | Freq: Three times a day (TID) | INTRAMUSCULAR | Status: DC
  Administered 2014-08-21 – 2014-08-25 (×12): 5000 [IU] via SUBCUTANEOUS
  Filled 2014-08-21 (×14): qty 1

## 2014-08-21 MED ORDER — LEVOTHYROXINE SODIUM 100 MCG PO TABS
100.0000 ug | ORAL_TABLET | Freq: Every day | ORAL | Status: DC
  Administered 2014-08-22 – 2014-08-25 (×4): 100 ug
  Filled 2014-08-21 (×5): qty 1

## 2014-08-21 MED ORDER — CHLORHEXIDINE GLUCONATE 0.12 % MT SOLN
15.0000 mL | Freq: Two times a day (BID) | OROMUCOSAL | Status: DC
  Administered 2014-08-21 – 2014-08-22 (×2): 15 mL via OROMUCOSAL
  Filled 2014-08-21 (×2): qty 15

## 2014-08-21 MED ORDER — VANCOMYCIN HCL IN DEXTROSE 1-5 GM/200ML-% IV SOLN: 1000.0000 mg | Freq: Once | INTRAVENOUS | Status: DC

## 2014-08-21 MED ORDER — ASPIRIN 81 MG PO CHEW: 324.0000 mg | CHEWABLE_TABLET | ORAL | Status: AC

## 2014-08-21 MED ORDER — VANCOMYCIN HCL 10 G IV SOLR
1250.0000 mg | Freq: Two times a day (BID) | INTRAVENOUS | Status: DC
  Administered 2014-08-22 – 2014-08-23 (×3): 1250 mg via INTRAVENOUS
  Filled 2014-08-21 (×4): qty 1250

## 2014-08-21 MED ORDER — LEVOFLOXACIN IN D5W 750 MG/150ML IV SOLN
750.0000 mg | Freq: Once | INTRAVENOUS | Status: AC
  Administered 2014-08-21: 750 mg via INTRAVENOUS
  Filled 2014-08-21: qty 150

## 2014-08-21 MED ORDER — VITAL HIGH PROTEIN PO LIQD: 1000.0000 mL | ORAL | Status: DC

## 2014-08-21 MED ORDER — FENTANYL CITRATE (PF) 100 MCG/2ML IJ SOLN
50.0000 ug | INTRAMUSCULAR | Status: DC | PRN
  Filled 2014-08-21: qty 2

## 2014-08-21 MED ORDER — VANCOMYCIN HCL 10 G IV SOLR
1750.0000 mg | Freq: Once | INTRAVENOUS | Status: AC
  Administered 2014-08-21: 1750 mg via INTRAVENOUS
  Filled 2014-08-21: qty 1750

## 2014-08-21 MED ORDER — INSULIN ASPART 100 UNIT/ML ~~LOC~~ SOLN
2.0000 [IU] | SUBCUTANEOUS | Status: DC
  Administered 2014-08-22: 2 [IU] via SUBCUTANEOUS

## 2014-08-21 MED ORDER — CETYLPYRIDINIUM CHLORIDE 0.05 % MT LIQD
7.0000 mL | Freq: Four times a day (QID) | OROMUCOSAL | Status: DC
  Administered 2014-08-22 (×2): 7 mL via OROMUCOSAL

## 2014-08-21 MED ORDER — PANTOPRAZOLE SODIUM 40 MG PO PACK: 40.0000 mg | PACK | Freq: Every day | ORAL | Status: DC

## 2014-08-21 MED ORDER — DEXTROSE IN LACTATED RINGERS 5 % IV SOLN
INTRAVENOUS | Status: DC
  Administered 2014-08-21: 100 mL/h via INTRAVENOUS
  Administered 2014-08-22: 22:00:00 via INTRAVENOUS

## 2014-08-21 MED ORDER — SODIUM CHLORIDE 0.9 % IV BOLUS (SEPSIS)
1000.0000 mL | Freq: Once | INTRAVENOUS | Status: AC
  Administered 2014-08-21: 1000 mL via INTRAVENOUS

## 2014-08-21 MED ORDER — PANTOPRAZOLE SODIUM 40 MG IV SOLR
40.0000 mg | Freq: Two times a day (BID) | INTRAVENOUS | Status: DC
  Administered 2014-08-21 – 2014-08-22 (×3): 40 mg via INTRAVENOUS
  Filled 2014-08-21 (×6): qty 40

## 2014-08-21 MED ORDER — DEXTROSE 5 % IV SOLN
1.0000 g | Freq: Three times a day (TID) | INTRAVENOUS | Status: DC
  Administered 2014-08-21 – 2014-08-23 (×5): 1 g via INTRAVENOUS
  Filled 2014-08-21 (×7): qty 1

## 2014-08-21 MED ORDER — DEXTROSE 5 % IV SOLN
2.0000 g | Freq: Once | INTRAVENOUS | Status: AC
  Administered 2014-08-21: 2 g via INTRAVENOUS
  Filled 2014-08-21: qty 2

## 2014-08-21 MED ORDER — LEVOFLOXACIN IN D5W 750 MG/150ML IV SOLN: 750.0000 mg | INTRAVENOUS | Status: DC

## 2014-08-21 MED ORDER — SUCCINYLCHOLINE CHLORIDE 20 MG/ML IJ SOLN
INTRAMUSCULAR | Status: AC
  Administered 2014-08-21: 140 mg
  Filled 2014-08-21: qty 1

## 2014-08-21 MED ORDER — SODIUM CHLORIDE 0.9 % IV SOLN: 250.0000 mL | INTRAVENOUS | Status: DC | PRN

## 2014-08-21 MED ORDER — CIPROFLOXACIN IN D5W 400 MG/200ML IV SOLN
400.0000 mg | Freq: Two times a day (BID) | INTRAVENOUS | Status: DC
  Filled 2014-08-21: qty 200

## 2014-08-21 MED ORDER — LIDOCAINE HCL (CARDIAC) 20 MG/ML IV SOLN
INTRAVENOUS | Status: AC
  Filled 2014-08-21: qty 5

## 2014-08-21 MED ORDER — RISPERIDONE 1 MG PO TABS
1.0000 mg | ORAL_TABLET | Freq: Every day | ORAL | Status: DC
Start: 2014-08-21 — End: 2014-08-25
  Administered 2014-08-21 – 2014-08-24 (×4): 1 mg
  Filled 2014-08-21 (×7): qty 1

## 2014-08-21 MED ORDER — FENTANYL CITRATE (PF) 100 MCG/2ML IJ SOLN
50.0000 ug | INTRAMUSCULAR | Status: DC | PRN
  Administered 2014-08-21 – 2014-08-22 (×2): 50 ug via INTRAVENOUS
  Filled 2014-08-21: qty 2

## 2014-08-21 NOTE — ED Notes (Addendum)
Both arms flaccid along with legs , no resistanct to gravity , reflexes  are intact and pt will respond to painful stimuli , pt will be intubated for airway protection and transport to CT scan

## 2014-08-21 NOTE — Clinical Social Work Note (Addendum)
Clinical Social Work Assessment  Patient Details  Name: Stacey Velazquez MRN: [id number] Date of Birth: 1945/09/21  Date of referral:  08/21/14               Reason for consult:  Other (Comment Required) (Admitted from [place])                Permission sought to share information with:  Case Manager, Customer service manager, Family Supports Permission granted to share information::  Yes, Verbal Permission Granted  Name::        Agency::  [facility]  Relationship::  Sister:  [name]  ?POA    [phone]  Contact Information:     Friend:  Worthy Flank  [phone]   Housing/Transportation Living arrangements for the past 2 months:  [place] of Information:  Facility, Friend/Neighbor Patient Interpreter Needed:  None Criminal Activity/Legal Involvement Pertinent to Current Situation/Hospitalization:  No - Comment as needed Significant Relationships:  Adult Children, Neighbor Lives with:  Facility Resident Do you feel safe going back to the place where you live?  Yes Need for family participation in patient care:  Yes (Comment) (patient currently intubated  ?Daughter as POA?)  Care giving concerns:  Patient currently intubated and not able to participate in care. Daughter: [name] called, but did not answer.  Patient has a friend at the bedside:  Worthy Flank whom she met 7 months ago at the [facility] in downtown [place]. Reports she and patient became friends and she checks in on her and is actively helping her with an apartment coming available in August per friend report. Friend reports she lives at [facility] and has so since moving here from [place]. Reports she thinks she lived in a nursing home prior to the group home.  Limted involvement per friend from the daughters, but thinks [name] is her POA. Little is known at this time about patient's baseline as facility contacted, but no one could be reached.     Social Worker assessment / plan:   Aware of patient being admitted and from a group home. Completed assessment with friend with what information she knew about patient. Called daughter and [facility], but unable to reach both.  Will continue to follow and obtain more information for assessment and assist with disposition as needed.  Unit CSW to follow up as patient is being more to ICU.  Employment status:  Retired Forensic scientist:  Medicare PT Recommendations:  Not assessed at this time [place] / Referral to community resources:  Other (Comment Required) (TBD depending on medical outcomes)  Patient/Family's Response to care:  NA  Patient/Family's Understanding of and Emotional Response to Diagnosis, Current Treatment, and Prognosis:  Friend is very concerned for patient and worried for her condition. Frustrated at this time she cannot receive more information, but understand HIPPA regulations and need to contact daughters with authorization.  Emotional Assessment Appearance:  Appears stated age Attitude/Demeanor/Rapport:  Sedated Affect (typically observed):  Unable to Assess (Intubated) Orientation:   (Currently intubated, baseline is oriented) Alcohol / Substance use:  Not Applicable Psych involvement (Current and /or in the community):  No (Comment)  Discharge Needs  Concerns to be addressed:  Decision making concerns Readmission within the last 30 days:  No Current discharge risk:  None Barriers to Discharge:  Continued Medical Work up   Lilly [place], LCSW 08/21/2014, 2:42 PM

## 2014-08-21 NOTE — ED Notes (Signed)
Pt intubated with 7.5 ET tube , ET tube removed due to no color change and unable to auscultate breath sounds

## 2014-08-21 NOTE — ED Notes (Signed)
Pt here from [facility] where she called to staff for vomiting, pt respoinding to painful stimuli on arrival , pt does have a gag reflex

## 2014-08-21 NOTE — ED Notes (Signed)
Family updated on pt by MD via  phone

## 2014-08-21 NOTE — Progress Notes (Addendum)
ANTIBIOTIC CONSULT NOTE - INITIAL  Pharmacy Consult:  Vancomycin / Azactam / Levaquin Indication:  Sepsis  Allergies  Allergen Reactions  . Ambien [Zolpidem Tartrate] Other (See Comments)    Hallucinations   . Amitriptyline Other (See Comments)    Reaction unknown  . Ampicillin Other (See Comments)    Stops heart per pt   . Anaprox [Naproxen Sodium] Other (See Comments)    Reaction unknown  . Benadryl [Diphenhydramine Hcl (Sleep)] Other (See Comments)    Rash  . Cetirizine & Related Other (See Comments)    Swelling   . Cortizone-10 [Hydrocortisone] Other (See Comments)    seizures   . Darvon [Propoxyphene] Other (See Comments)    Increased heart rate per pt   . Diazepam Other (See Comments)    Reaction unknown  . Diflunisal Other (See Comments)    Reaction unknown  . Duloxetine Other (See Comments)    Reaction unknown  . Flexeril [Cyclobenzaprine] Other (See Comments)    Seizures   . Lidocaine Other (See Comments)    Seizures  . Meperidine And Related Other (See Comments)    Reaction unknown  . Metanx [L-Methylfolate-Algae-B12-B6] Other (See Comments)    Reaction unknown  . Metoclopramide Other (See Comments)    Reaction unknown  . Morphine And Related Other (See Comments)    Stops heart per pt   . Naproxen   . Nuprin [Ibuprofen] Other (See Comments)    Reaction unknown  . Oxycodone Other (See Comments)    Hallucinations   . Penicillins Other (See Comments)    Stops heart per pt   . Propoxyphene Other (See Comments)    Reaction unknown  . Ranitidine Other (See Comments)    Reaction unknown  . Tylenol [Acetaminophen] Other (See Comments)    Reaction unknown  . Vicodin [Hydrocodone-Acetaminophen] Other (See Comments)    Seizures     Patient Measurements: Height: 5\' 11"  (180.3 cm) IBW/kg (Calculated) : 70.8 Weight = 95 kg  Vital Signs: BP: 117/65 mmHg (07/27 1115) Pulse Rate: 69 (07/27 1126)  Labs:  Recent Labs  08/21/14 1100  WBC 2.0*   HGB 13.6  PLT PENDING   CrCl cannot be calculated (Unknown ideal weight.). No results for input(s): VANCOTROUGH, VANCOPEAK, VANCORANDOM, GENTTROUGH, GENTPEAK, GENTRANDOM, TOBRATROUGH, TOBRAPEAK, TOBRARND, AMIKACINPEAK, AMIKACINTROU, AMIKACIN in the last 72 hours.   Microbiology: Recent Results (from the past 720 hour(s))  Blood culture (routine x 2)     Status: None (Preliminary result)   Collection Time: 08/21/14 11:00 AM  Result Value Ref Range Status   Specimen Description BLOOD LEFT ANTECUBITAL  Final   Special Requests BOTTLES DRAWN AEROBIC AND ANAEROBIC 5CC  Final   Culture PENDING  Incomplete   Report Status PENDING  Incomplete  Urine culture     Status: None (Preliminary result)   Collection Time: 08/21/14 11:50 AM  Result Value Ref Range Status   Specimen Description URINE, CATHETERIZED  Final   Special Requests Normal  Final   Culture PENDING  Incomplete   Report Status PENDING  Incomplete    Medical History: Past Medical History  Diagnosis Date  . Seizures   . Bipolar 1 disorder   . Sinus arrest   . Pacemaker Medtronic     MRI compatible  . Hypotension   . Autoimmune hepatitis   . GERD (gastroesophageal reflux disease)   . Hypothyroidism      Assessment: 23 YOF from [place] with vomiting and AMS.  Pharmacy consulted to initiate broad spectrum  antibiotics for sepsis.  First doses of antibiotics are already ordered.  Baseline labs reviewed.  Vanc 7/27 >> Azactam 7/27 >> LVQ 7/27 >>  7/27 BCx x2 - 7/27 UCx -    Goal of Therapy:  Vancomycin trough level 15-20 mcg/ml   Plan:  - Vanc 1250mg  IV Q12H, start tomorrow - LVQ 750mg  IV Q24H, start tomorrow - Azactam 1gm IV Q8H - Monitor renal fxn, clinical progress, vanc trough at [facility] - F/U updated weight    Joniece Smotherman D. Mina Marble, PharmD, BCPS Pager:  [phone] 08/21/2014, 1:06 PM    ==========================   Addendum: - change from LVQ to Cipro   Plan: - Cipro 400mg  IV Q12H, start  tomorrow    Lorra Freeman D. Mina Marble, PharmD, BCPS Pager:  [phone] 08/21/2014, 2:27 PM

## 2014-08-21 NOTE — ED Provider Notes (Signed)
CSN: [id number]     Arrival date & time 08/21/14  25 History   First MD Initiated Contact with Patient 08/21/14 1055     Chief Complaint  Patient presents with  . Altered Mental Status  . Emesis   Level V caveat acute situation, unstable vital signs  (Consider location/radiation/quality/duration/timing/severity/associated sxs/prior Treatment) HPI Seen on arrival patient unresponsive. EMS was called without daycare when patient vomited and became unresponsive. EMS performed CBG which was 159. She vomited a second time while in route. No other treatment. Past Medical History  Diagnosis Date  . Seizures   . Bipolar 1 disorder   . Sinus arrest   . Pacemaker Medtronic     MRI compatible  . Hypotension   . Autoimmune hepatitis   . GERD (gastroesophageal reflux disease)   . Hypothyroidism    Past Surgical History  Procedure Laterality Date  . Pacemaker insertion     Family History  Problem Relation Age of Onset  . Heart attack Father   . Heart disease Paternal Uncle   . Heart disease Paternal Grandmother   . Diabetes Paternal Grandmother   . Heart disease Paternal Grandfather   . Cancer Mother     BREAST  . Diabetes Sister   . Diabetes Brother   . Cancer Maternal Grandmother   . CVA Maternal Grandmother   . Cancer Maternal Grandfather   . CVA Maternal Grandfather   . Diabetes Brother    History  Substance Use Topics  . Smoking status: Never Smoker   . Smokeless tobacco: Not on file  . Alcohol Use: No   OB History    No data available     Review of Systems  Unable to perform ROS: Acuity of condition      Allergies  Ambien; Amitriptyline; Ampicillin; Anaprox; Benadryl; Cetirizine & related; Cortizone-10; Darvon; Diazepam; Diflunisal; Duloxetine; Flexeril; Lidocaine; Meperidine and related; Metanx; Metoclopramide; Morphine and related; Naproxen; Nuprin; Oxycodone; Penicillins; Propoxyphene; Ranitidine; Tylenol; and Vicodin  Home Medications   Prior to  Admission medications   Medication Sig Start Date End Date Taking? Authorizing Provider  azaTHIOprine (IMURAN) 50 MG tablet Take 2 tablets (100 mg total) by mouth daily. 08/02/14   Lorayne Marek, MD  Cholecalciferol (VITAMIN D) 1000 UNITS capsule Take 1,000 Units by mouth daily.    Historical Provider, MD  furosemide (LASIX) 20 MG tablet Take 1 tablet (20 mg total) by mouth daily. 04/02/14   [name], MD  ketoprofen (ORUDIS) 75 MG capsule Take 1 capsule (75 mg total) by mouth 3 (three) times daily as needed (for headache). 05/10/14   Lorayne Marek, MD  Lactobacillus (LACTINEX) PACK Take 1 each by mouth daily as needed (for lactose intolerance).     Historical Provider, MD  levETIRAcetam (KEPPRA) 250 MG tablet Take 1 tablet (250 mg total) by mouth 2 (two) times daily. 08/02/14   Lorayne Marek, MD  levothyroxine (SYNTHROID, LEVOTHROID) 100 MCG tablet Take 1 tablet (100 mcg total) by mouth daily before breakfast. 08/02/14   Lorayne Marek, MD  meclizine (ANTIVERT) 12.5 MG tablet Take 1 tablet (12.5 mg total) by mouth 3 (three) times daily as needed for dizziness. 08/02/14   Lorayne Marek, MD  risperiDONE (RISPERDAL) 3 MG tablet Take 3 mg by mouth at bedtime.    Historical Provider, MD   BP 76/42 mmHg  Pulse 74  Resp 14  Ht 5\' 11"  (1.803 m)  SpO2 88% Physical Exam  Constitutional:  Chronically and acutely ill-appearing. Glasgow Coma Score 11. 2  off for speech, 2 off for eye opening  HENT:  Head: Normocephalic and atraumatic.  Eyes: Conjunctivae are normal. Pupils are equal, round, and reactive to light.  Neck: Neck supple. No tracheal deviation present. No thyromegaly present.  Cardiovascular: Normal rate and regular rhythm.   No murmur heard. Pulmonary/Chest: Effort normal and breath sounds normal.  Abdominal: Soft. Bowel sounds are normal. She exhibits no distension. There is no tenderness.  Obese  Musculoskeletal:  Follow simple commands, moves all extremities, speaks slurred and  incomprehensible  Neurological:  Arousable to noxious  stimulus by opening her eyes. Speech slurred. She follows simple commands moves all extremities  Skin: Skin is warm and dry. Rash noted.  Petechial rash on extremities  Nursing note and vitals reviewed.   ED Course  Procedures (including critical care time) Labs Review Labs Reviewed  CULTURE, BLOOD (ROUTINE X 2)  CULTURE, BLOOD (ROUTINE X 2)  URINE CULTURE  BLOOD GAS, ARTERIAL  CBC  COMPREHENSIVE METABOLIC PANEL  PROTIME-INR  I-STAT CG4 LACTIC ACID, ED  I-STAT TROPOININ, ED    Imaging Review No results found.   EKG Interpretation None     Patient was intubated by me. Timeout performed She was premedicated with etomidate 0.3 g/kg and succinylcholine 1.50 g/kg intravenously. Respirations assisted with bag valve mask. Pulse oximetry never dropped below 96%. She was intubated successfully on second attempt using direct laryngoscopy with #0.5 ET tube which was taped at 23 cm at the gingiva ED ECG REPORT   Date: 08/21/2014  Rate: 75  Rhythm: normal sinus rhythm  QRS Axis: normal  Intervals: normal  ST/T Wave abnormalities: nonspecific T wave changes  Conduction Disutrbances:none  Narrative Interpretation:   Old EKG Reviewed: No significant change from 04/02/2014 interpreted by me  I have personally reviewed the EKG tracing and agree with the computerized printout as noted. I spoke with patient's daughter Laurence Compton to advise her patient's condition. Patient hemodynamically stable after treatment with intravenous fluid bolus and intravenous antibiotics. Results for orders placed or performed during the hospital encounter of 08/21/14  Blood culture (routine x 2)  Result Value Ref Range   Specimen Description BLOOD LEFT ANTECUBITAL    Special Requests BOTTLES DRAWN AEROBIC AND ANAEROBIC 5CC    Culture PENDING    Report Status PENDING   Urine culture  Result Value Ref Range   Specimen Description URINE, CATHETERIZED     Special Requests Normal    Culture PENDING    Report Status PENDING   CBC  Result Value Ref Range   WBC 2.0 (L) 4.0 - 10.5 K/uL   RBC 4.23 3.87 - 5.11 MIL/uL   Hemoglobin 13.6 12.0 - 15.0 g/dL   HCT 39.5 36.0 - 46.0 %   MCV 93.4 78.0 - 100.0 fL   MCH 32.2 26.0 - 34.0 pg   MCHC 34.4 30.0 - 36.0 g/dL   RDW 14.8 11.5 - 15.5 %   Platelets 91 (L) 150 - 400 K/uL  Comprehensive metabolic panel  Result Value Ref Range   Sodium 135 135 - 145 mmol/L   Potassium 3.9 3.5 - 5.1 mmol/L   Chloride 106 101 - 111 mmol/L   CO2 21 (L) 22 - 32 mmol/L   Glucose, Bld 107 (H) 65 - 99 mg/dL   BUN 8 6 - 20 mg/dL   Creatinine, Ser 0.79 0.44 - 1.00 mg/dL   Calcium 8.2 (L) 8.9 - 10.3 mg/dL   Total Protein 7.3 6.5 - 8.1 g/dL   Albumin 2.8 (L)  3.5 - 5.0 g/dL   AST 34 15 - 41 U/L   ALT 16 14 - 54 U/L   Alkaline Phosphatase 61 38 - 126 U/L   Total Bilirubin 0.6 0.3 - 1.2 mg/dL   GFR calc non Af Amer >60 >60 mL/min   GFR calc Af Amer >60 >60 mL/min   Anion gap 8 5 - 15  Protime-INR  Result Value Ref Range   Prothrombin Time 15.1 11.6 - 15.2 seconds   INR 1.18 0.00 - 1.49  Urinalysis, Routine w reflex microscopic (not at [facility])  Result Value Ref Range   Color, Urine YELLOW YELLOW   APPearance CLOUDY (A) CLEAR   Specific Gravity, Urine 1.011 1.005 - 1.030   pH 7.0 5.0 - 8.0   Glucose, UA NEGATIVE NEGATIVE mg/dL   Hgb urine dipstick NEGATIVE NEGATIVE   Bilirubin Urine NEGATIVE NEGATIVE   Ketones, ur NEGATIVE NEGATIVE mg/dL   Protein, ur NEGATIVE NEGATIVE mg/dL   Urobilinogen, UA 1.0 0.0 - 1.0 mg/dL   Nitrite POSITIVE (A) NEGATIVE   Leukocytes, UA NEGATIVE NEGATIVE  Blood gas, arterial  Result Value Ref Range   FIO2 1.00    Delivery systems VENTILATOR    Mode PRESSURE REGULATED VOLUME CONTROL    VT 570.0 mL   Rate 15.0 resp/min   Peep/cpap 5.0 cm H20   pH, Arterial 7.426 7.350 - 7.450   pCO2 arterial 31.8 (L) 35.0 - 45.0 mmHg   pO2, Arterial 377 (H) 80.0 - 100.0 mmHg   Bicarbonate 20.5  20.0 - 24.0 mEq/L   TCO2 21.5 0 - 100 mmol/L   Acid-base deficit 3.1 (H) 0.0 - 2.0 mmol/L   O2 Saturation 100.0 %   Patient temperature 98.6    Collection site RIGHT RADIAL    Drawn by [phone]    Sample type ARTERIAL DRAW    Allens test (pass/fail) PASS PASS  Urine microscopic-add on  Result Value Ref Range   Squamous Epithelial / LPF MANY (A) RARE   WBC, UA 0-2 <3 WBC/hpf   Bacteria, UA MANY (A) RARE  I-Stat CG4 Lactic Acid, ED  Result Value Ref Range   Lactic Acid, Venous 2.29 (HH) 0.5 - 2.0 mmol/L   Comment NOTIFIED PHYSICIAN   I-Stat Troponin, ED (not at [facility])  Result Value Ref Range   Troponin i, poc 0.00 0.00 - 0.08 ng/mL   Comment 3           Ct Head Wo Contrast  08/21/2014   CLINICAL DATA:  Unresponsive, hypotensive, history of seizures, bipolar disorder  EXAM: CT HEAD WITHOUT CONTRAST  TECHNIQUE: Contiguous axial images were obtained from the base of the skull through the vertex without intravenous contrast.  COMPARISON:  10/31/2013  FINDINGS: There is no evidence of mass effect, midline shift, or extra-axial fluid collections. There is no evidence of a space-occupying lesion or intracranial hemorrhage. There is no evidence of a cortical-based area of acute infarction. There is generalized cerebral atrophy. There is periventricular white matter low attenuation likely secondary to microangiopathy.  The ventricles and sulci are appropriate for the patient's age. The basal cisterns are patent.  Visualized portions of the orbits are unremarkable. The visualized portions of the paranasal sinuses and mastoid air cells are unremarkable.  The osseous structures are unremarkable.  IMPRESSION: 1. No acute intracranial pathology. 2. Chronic microvascular disease and cerebral atrophy.   Electronically Signed   By: Kathreen Devoid   On: 08/21/2014 12:30   Dg Chest Portable 1 View  08/21/2014  CLINICAL DATA:  Intubation  EXAM: PORTABLE CHEST - 1 VIEW  COMPARISON:  None.  FINDINGS: Lungs are under  aerated with bibasilar atelectasis. Endotracheal tube in place with its tip 2.0 cm from the carina. NG tube in place. Tip is beyond the gastroesophageal junction. Upper lungs clear. Normal heart size.  IMPRESSION: Endotracheal and NG tubes as described.  Bibasilar atelectasis.   Electronically Signed   By: Marybelle Killings M.D.   On: 08/21/2014 11:42   Chest xray viewed by me MDM  Decision made to intubate patient she was hypotensive on arrival. Hypoxic and had vomited, Final diagnoses:  None   code sepsis called in light of petechial rash, hypotension, treated with broad spectrum antibiotics Patient to be treated with broad-spectrum antibiotics, she will be admitted to the intensive care unit.  Dx #1 hypotension #2 acute encephalopathy #3thrombocytopenia CRITICAL CARE Performed by: Orlie Dakin Total critical care time: 45 minutes Critical care time was exclusive of separately billable procedures and treating other patients. Critical care was necessary to treat or prevent imminent or life-threatening deterioration. Critical care was time spent personally by me on the following activities: development of treatment plan with patient and/or surrogate as well as nursing, discussions with consultants, evaluation of patient's response to treatment, examination of patient, obtaining history from patient or surrogate, ordering and performing treatments and interventions, ordering and review of laboratory studies, ordering and review of radiographic studies, pulse oximetry and re-evaluation of patient's condition.    Orlie Dakin, MD 08/21/14 1414

## 2014-08-21 NOTE — ED Notes (Signed)
Lab results of I-Stat CG4 2.29 reported to Dr.J.

## 2014-08-21 NOTE — ED Notes (Signed)
Pt intubated with a 7.5 ET tube  23  at lip , color change positive , all four lung auscultated

## 2014-08-21 NOTE — H&P (Signed)
PULMONARY / CRITICAL CARE MEDICINE   Name: Stacey Velazquez MRN: [id number] DOB: 08/15/1945    ADMISSION DATE:  08/21/2014 CONSULTATION DATE:  7/27  REFERRING MD :  Cathleen Fears   CHIEF COMPLAINT:  Acute encephalopathy   INITIAL PRESENTATION:   69 year old female who was admitted 7/27 w/ sudden decreased LOC, vomiting and hypotension. She was intubated for airway protection. PCCM asked to admit for further eval and rx.   * of note had diffuse petechial rash on presentation.     STUDIES:  CT head 7/27: no acute findings   SIGNIFICANT EVENTS:    HISTORY OF PRESENT ILLNESS:   This is a 68 year old female who currently resides at a group home (but was planning on getting an apartment). Was at an adult daycare when she became unresponsive. Apparently vomited while at the daycare and again in route to the hospital. It was unclear if the vomiting occurred prior to her AMS or after. EMS was called. On arrival to the ER she was hypotensive w/ her SBP in 70s, and lethargic with a GCS of 8. She was intubated for inability to protect airway. A CT of head was obtained and was negative for acute process. Cultures were obtained, empiric antibiotics were started and IV crystalloid bolus initiated. Her BP responded well to volume. PCCM  Was asked to admit.   PAST MEDICAL HISTORY :   has a past medical history of Seizures; Bipolar 1 disorder; Sinus arrest; Pacemaker [employer]; Hypotension; Autoimmune hepatitis; GERD (gastroesophageal reflux disease); and Hypothyroidism.  has past surgical history that includes Pacemaker insertion. Prior to Admission medications   Medication Sig Start Date End Date Taking? Authorizing Provider  azaTHIOprine (IMURAN) 50 MG tablet Take 2 tablets (100 mg total) by mouth daily. 08/02/14   Lorayne Marek, MD  Cholecalciferol (VITAMIN D) 1000 UNITS capsule Take 1,000 Units by mouth daily.    Historical Provider, MD  furosemide (LASIX) 20 MG tablet Take 1 tablet (20 mg total) by  mouth daily. 04/02/14   [name], MD  ketoprofen (ORUDIS) 75 MG capsule Take 1 capsule (75 mg total) by mouth 3 (three) times daily as needed (for headache). 05/10/14   Lorayne Marek, MD  Lactobacillus (LACTINEX) PACK Take 1 each by mouth daily as needed (for lactose intolerance).     Historical Provider, MD  levETIRAcetam (KEPPRA) 250 MG tablet Take 1 tablet (250 mg total) by mouth 2 (two) times daily. 08/02/14   Lorayne Marek, MD  levothyroxine (SYNTHROID, LEVOTHROID) 100 MCG tablet Take 1 tablet (100 mcg total) by mouth daily before breakfast. 08/02/14   Lorayne Marek, MD  meclizine (ANTIVERT) 12.5 MG tablet Take 1 tablet (12.5 mg total) by mouth 3 (three) times daily as needed for dizziness. 08/02/14   Lorayne Marek, MD  risperiDONE (RISPERDAL) 3 MG tablet Take 3 mg by mouth at bedtime.    Historical Provider, MD   Allergies  Allergen Reactions  . Ambien [Zolpidem Tartrate] Other (See Comments)    Hallucinations   . Amitriptyline Other (See Comments)    Reaction unknown  . Ampicillin Other (See Comments)    Stops heart per pt   . Anaprox [Naproxen Sodium] Other (See Comments)    Reaction unknown  . Benadryl [Diphenhydramine Hcl (Sleep)] Other (See Comments)    Rash  . Cetirizine & Related Other (See Comments)    Swelling   . Cortizone-10 [Hydrocortisone] Other (See Comments)    seizures   . Darvon [Propoxyphene] Other (See Comments)  Increased heart rate per pt   . Diazepam Other (See Comments)    Reaction unknown  . Diflunisal Other (See Comments)    Reaction unknown  . Duloxetine Other (See Comments)    Reaction unknown  . Flexeril [Cyclobenzaprine] Other (See Comments)    Seizures   . Lidocaine Other (See Comments)    Seizures  . Meperidine And Related Other (See Comments)    Reaction unknown  . Metanx [L-Methylfolate-Algae-B12-B6] Other (See Comments)    Reaction unknown  . Metoclopramide Other (See Comments)    Reaction unknown  . Morphine And Related Other  (See Comments)    Stops heart per pt   . Naproxen   . Nuprin [Ibuprofen] Other (See Comments)    Reaction unknown  . Oxycodone Other (See Comments)    Hallucinations   . Penicillins Other (See Comments)    Stops heart per pt   . Propoxyphene Other (See Comments)    Reaction unknown  . Ranitidine Other (See Comments)    Reaction unknown  . Tylenol [Acetaminophen] Other (See Comments)    Reaction unknown  . Vicodin [Hydrocodone-Acetaminophen] Other (See Comments)    Seizures     FAMILY HISTORY:  indicated that her mother is deceased. She indicated that her father is deceased. She indicated that her maternal grandmother is deceased. She indicated that her maternal grandfather is deceased. She indicated that her paternal grandmother is deceased. She indicated that her paternal grandfather is deceased. She indicated that her maternal aunt is deceased. She indicated that her maternal uncle is deceased. She indicated that her paternal uncle is deceased.  SOCIAL HISTORY:  reports that she has never smoked. She does not have any smokeless tobacco history on file. She reports that she does not drink alcohol or use illicit drugs.  REVIEW OF SYSTEMS:  Unable     VITAL SIGNS: Pulse Rate:  [69-89] 87 (07/27 1315) Resp:  [9-16] 15 (07/27 1315) BP: (76-130)/(42-83) 129/77 mmHg (07/27 1315) SpO2:  [88 %-100 %] 99 % (07/27 1315) FiO2 (%):  [40 %-100 %] 40 % (07/27 1224) HEMODYNAMICS:   VENTILATOR SETTINGS: Vent Mode:  [-] PRVC FiO2 (%):  [40 %-100 %] 40 % Set Rate:  [15 bmp] 15 bmp Vt Set:  [570 mL] 570 mL PEEP:  [5 cmH20] 5 cmH20 Plateau Pressure:  [15 cmH20] 15 cmH20 INTAKE / OUTPUT: No intake or output data in the 24 hours ending 08/21/14 1422  PHYSICAL EXAMINATION: General:  Chronically ill appearing  Neuro:  Lethargic, moves all ext, follows commands but all ext very weak HEENT:  Orally intubated  Cardiovascular:  rrr Lungs:  Scattered rhonchi Abdomen: soft, not tender +  bowel sounds. Intermittent emesis prior to OGT placed on sxn Musculoskeletal:  Intact/weak  Skin:  Diffuse petechial rash  LABS:  CBC  Recent Labs Lab 08/21/14 1100  WBC 2.0*  HGB 13.6  HCT 39.5  PLT 91*   Coag's  Recent Labs Lab 08/21/14 1100  INR 1.18   BMET  Recent Labs Lab 08/21/14 1100  NA 135  K 3.9  CL 106  CO2 21*  BUN 8  CREATININE 0.79  GLUCOSE 107*   Electrolytes  Recent Labs Lab 08/21/14 1100  CALCIUM 8.2*   Sepsis Markers  Recent Labs Lab 08/21/14 1146  LATICACIDVEN 2.29*   ABG  Recent Labs Lab 08/21/14 1140  PHART 7.426  PCO2ART 31.8*  PO2ART 377*   Liver Enzymes  Recent Labs Lab 08/21/14 1100  AST 34  ALT 16  ALKPHOS 61  BILITOT 0.6  ALBUMIN 2.8*   Cardiac Enzymes No results for input(s): TROPONINI, PROBNP in the last 168 hours. Glucose No results for input(s): GLUCAP in the last 168 hours.  Imaging Ct Head Wo Contrast  08/21/2014   CLINICAL DATA:  Unresponsive, hypotensive, history of seizures, bipolar disorder  EXAM: CT HEAD WITHOUT CONTRAST  TECHNIQUE: Contiguous axial images were obtained from the base of the skull through the vertex without intravenous contrast.  COMPARISON:  10/31/2013  FINDINGS: There is no evidence of mass effect, midline shift, or extra-axial fluid collections. There is no evidence of a space-occupying lesion or intracranial hemorrhage. There is no evidence of a cortical-based area of acute infarction. There is generalized cerebral atrophy. There is periventricular white matter low attenuation likely secondary to microangiopathy.  The ventricles and sulci are appropriate for the patient's age. The basal cisterns are patent.  Visualized portions of the orbits are unremarkable. The visualized portions of the paranasal sinuses and mastoid air cells are unremarkable.  The osseous structures are unremarkable.  IMPRESSION: 1. No acute intracranial pathology. 2. Chronic microvascular disease and cerebral  atrophy.   Electronically Signed   By: Kathreen Devoid   On: 08/21/2014 12:30   Dg Chest Portable 1 View  08/21/2014   CLINICAL DATA:  Intubation  EXAM: PORTABLE CHEST - 1 VIEW  COMPARISON:  None.  FINDINGS: Lungs are under aerated with bibasilar atelectasis. Endotracheal tube in place with its tip 2.0 cm from the carina. NG tube in place. Tip is beyond the gastroesophageal junction. Upper lungs clear. Normal heart size.  IMPRESSION: Endotracheal and NG tubes as described.  Bibasilar atelectasis.   Electronically Signed   By: Marybelle Killings M.D.   On: 08/21/2014 11:42     ASSESSMENT / PLAN:  PULMONARY OETT 7/27>>> A: Ineffective airway protection  P:   Full vent support PAD protocol F/u abg and PCXR  Daily assessment for weaning   CARDIOVASCULAR CVL A:  H/o sinus arrest. Has perm pacemaker  Transient hypotension  Sepsis (s/p 30 ml/kg fluid) P:  Tele  IV hydration  Hold antihypertensives  Hold diuretics  See ID section    RENAL A:   No acute but at risk for electrolyte derangements  P:   Serial chemistries  Strict I&O   GASTROINTESTINAL A:   Nausea and vomiting H/o autoimmune hepatitis  P:   Hold off on tube feeds Place OGT to LIWS Get amylase and lipase   HEMATOLOGIC A:   Thrombocytopenia (chronic) Petechial rash  P:  Send LDH Send DIC panel Trend CBC   INFECTIOUS A:  Sepsis: source unclear: possibly UT P:   BCx2  7/27>>> UC  7/27>>> Sputum 7/27>>> azactam 7/27>>> cipro 7/27>>> vanc 7/27>>> Hold imuran   ENDOCRINE A:   Hypothyroidism  P:   Ck tsh Cont synthroid cbgs   NEUROLOGIC A:   Acute encephalopathy   P:   RASS goal: -2 PAD protocol    FAMILY  - Updates:   - Inter-disciplinary family meet or Palliative Care meeting due by:  8/2    TODAY'S SUMMARY:  Ams/hypotension/ nausea and vomiting. Most likely sepsis in setting of immuno-supression. Source ? UT vs GI. Pan culture, cont IVFs, empiric abx, DIC panel and LDH for  petechial rash.   Erick Colace ACNP-BC [place] Pager # [phone] OR # [phone] if no answer   Attending Note:  69 year old female with an extensive medical history and a more extensive allergy list  who presents to the hospital from a group home with a UTI associated with N/V and AMS.  Patient was intubated for airway protection and head CT was ordered that showed no acute intracranial abnormalities.  Lungs are interestingly clear on exam.  Requiring minimal sedation.  WBC is very low but patient has been on imuran for autoimmune hepatitis.  Will treat with aztreonam and vanc, pan culture, concerned that she will develop septic shock, if BP fluctuates then will place TLC and start sepsis protocol.  The patient is critically ill with multiple organ systems failure and requires high complexity decision making for assessment and support, frequent evaluation and titration of therapies, application of advanced monitoring technologies and extensive interpretation of multiple databases.   Critical Care Time devoted to patient care services described in this note is  45  Minutes. This time reflects time of care of this signee Dr Jennet Maduro. This critical care time does not reflect procedure time, or teaching time or supervisory time of PA/NP/Med student/Med Resident etc but could involve care discussion time.  Rush Farmer, M.D. [facility] Pulmonary/Critical Care Medicine. Pager: [phone]. After hours pager: [phone].   08/21/2014, 2:22 PM

## 2014-08-22 ENCOUNTER — Encounter (HOSPITAL_COMMUNITY): Payer: Self-pay | Admitting: *Deleted

## 2014-08-22 ENCOUNTER — Inpatient Hospital Stay (HOSPITAL_COMMUNITY): Payer: Medicare Other

## 2014-08-22 DIAGNOSIS — J9602 Acute respiratory failure with hypercapnia: Secondary | ICD-10-CM

## 2014-08-22 LAB — CBC
HCT: 37.1 % (ref 36.0–46.0)
Hemoglobin: 12.8 g/dL (ref 12.0–15.0)
MCH: 32 pg (ref 26.0–34.0)
MCHC: 34.5 g/dL (ref 30.0–36.0)
MCV: 92.8 fL (ref 78.0–100.0)
Platelets: 79 10*3/uL — ABNORMAL LOW (ref 150–400)
RBC: 4 MIL/uL (ref 3.87–5.11)
RDW: 14.7 % (ref 11.5–15.5)
WBC: 2.7 10*3/uL — ABNORMAL LOW (ref 4.0–10.5)

## 2014-08-22 LAB — BLOOD GAS, ARTERIAL
Acid-base deficit: 0.3 mmol/L (ref 0.0–2.0)
Bicarbonate: 21.9 mEq/L (ref 20.0–24.0)
Drawn by: 440661
FIO2: 0.4
MECHVT: 570 mL
O2 Saturation: 99.5 %
PEEP: 5 cmH2O
Patient temperature: 98.6
RATE: 15 resp/min
TCO2: 22.7 mmol/L (ref 0–100)
pCO2 arterial: 24.6 mmHg — ABNORMAL LOW (ref 35.0–45.0)
pH, Arterial: 7.558 — ABNORMAL HIGH (ref 7.350–7.450)
pO2, Arterial: 137 mmHg — ABNORMAL HIGH (ref 80.0–100.0)

## 2014-08-22 LAB — BASIC METABOLIC PANEL
Anion gap: 6 (ref 5–15)
BUN: 6 mg/dL (ref 6–20)
CO2: 23 mmol/L (ref 22–32)
Calcium: 7.8 mg/dL — ABNORMAL LOW (ref 8.9–10.3)
Chloride: 107 mmol/L (ref 101–111)
Creatinine, Ser: 0.67 mg/dL (ref 0.44–1.00)
GFR calc Af Amer: >60 mL/min (ref >60)
GFR calc non Af Amer: >60 mL/min (ref >60)
Glucose, Bld: 121 mg/dL — ABNORMAL HIGH (ref 65–99)
Potassium: 3 mmol/L — ABNORMAL LOW (ref 3.5–5.1)
Sodium: 136 mmol/L (ref 135–145)

## 2014-08-22 LAB — GLUCOSE, CAPILLARY
Glucose-Capillary: 110 mg/dL — ABNORMAL HIGH (ref 65–99)
Glucose-Capillary: 111 mg/dL — ABNORMAL HIGH (ref 65–99)
Glucose-Capillary: 112 mg/dL — ABNORMAL HIGH (ref 65–99)
Glucose-Capillary: 117 mg/dL — ABNORMAL HIGH (ref 65–99)
Glucose-Capillary: 128 mg/dL — ABNORMAL HIGH (ref 65–99)
Glucose-Capillary: 79 mg/dL (ref 65–99)

## 2014-08-22 LAB — PHOSPHORUS: Phosphorus: 2 mg/dL — ABNORMAL LOW (ref 2.5–4.6)

## 2014-08-22 LAB — AMMONIA: Ammonia: 28 umol/L (ref 9–35)

## 2014-08-22 LAB — MAGNESIUM: Magnesium: 1.8 mg/dL (ref 1.7–2.4)

## 2014-08-22 LAB — PROCALCITONIN: Procalcitonin: <0.1 ng/mL

## 2014-08-22 LAB — TSH: TSH: 0.349 u[IU]/mL — ABNORMAL LOW (ref 0.350–4.500)

## 2014-08-22 IMAGING — CR DG CHEST 1V PORT
1 series · 1 of 1 positions shown · non-contrast
Comparison: [DATE]

CLINICAL DATA: Hypoxia

EXAM:
PORTABLE CHEST - 1 VIEW

[AP]
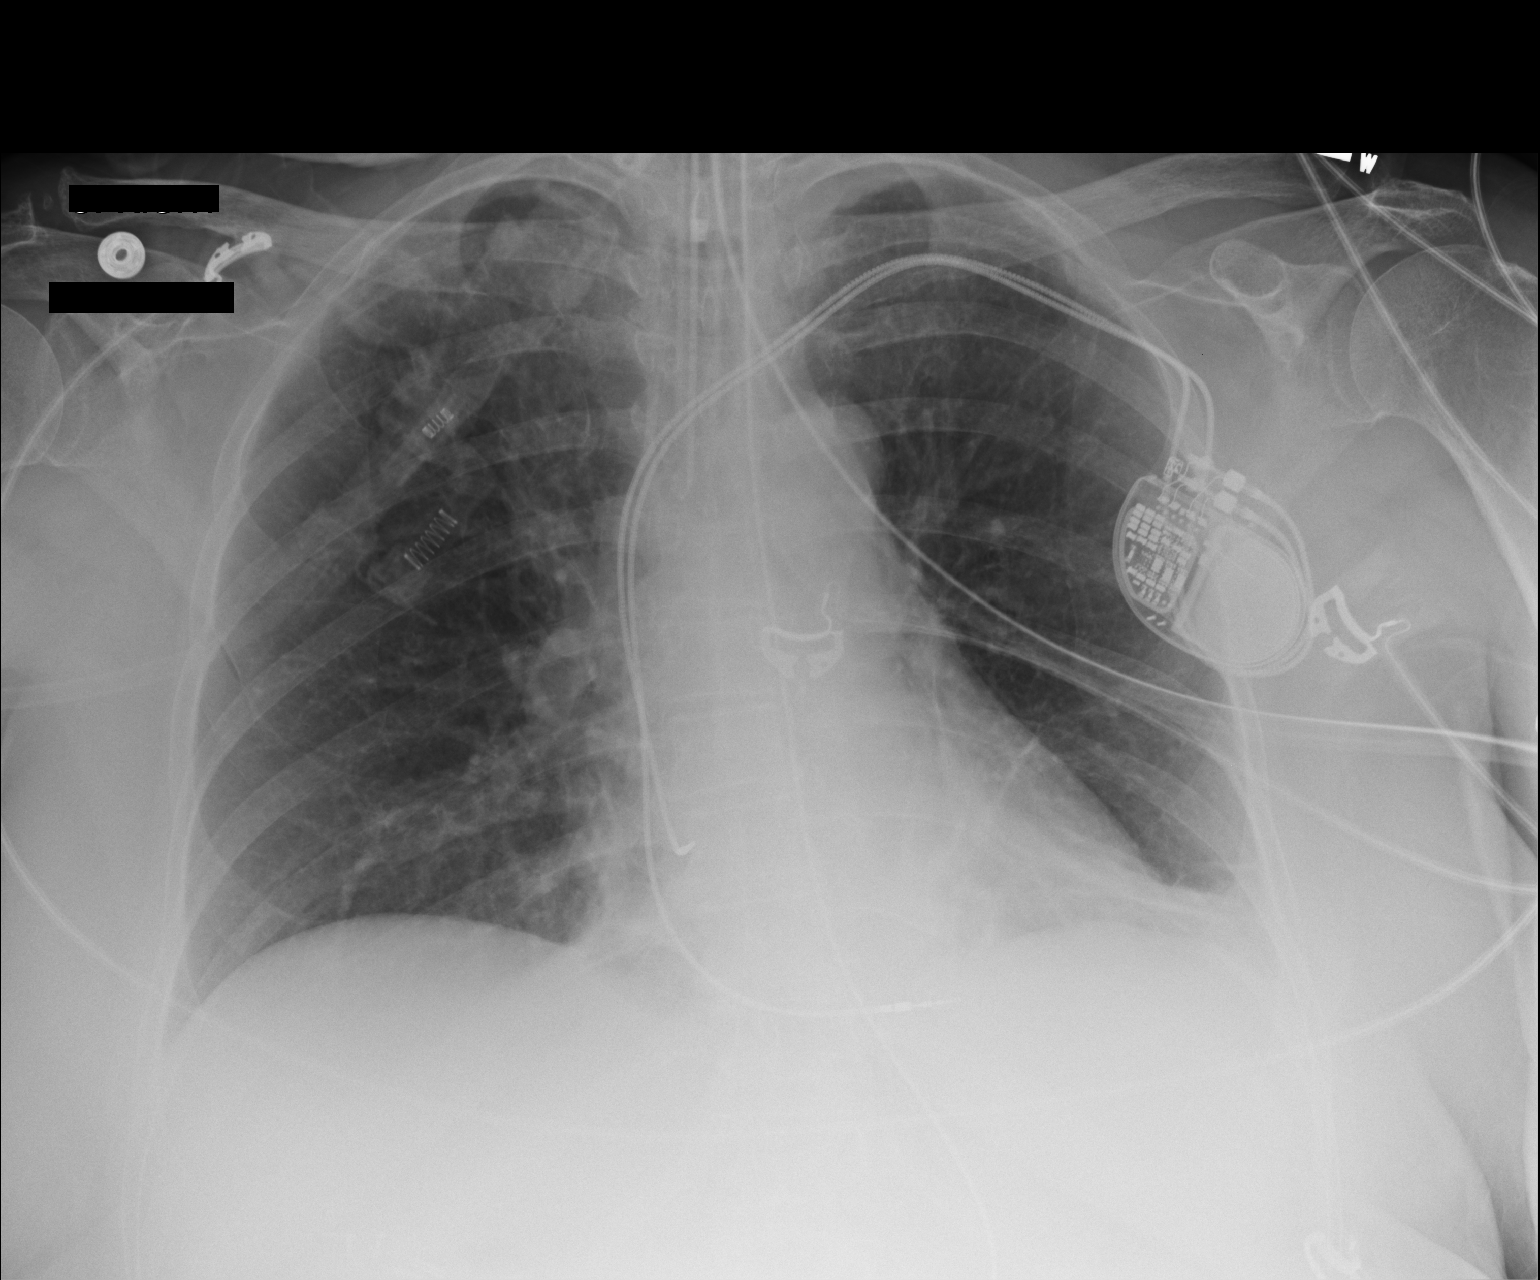

[1 of 1 positions shown; findings below may reference images not displayed]

FINDINGS: Endotracheal tube tip is 1.8 cm above the carina. Nasogastric tube
tip and side port are below the diaphragm. Pacemaker leads are
attached to the right atrium and right ventricle. No pneumothorax.
There is patchy atelectasis in the left base. Lungs elsewhere clear.
Heart size and pulmonary vascularity are normal. No adenopathy. No
bone lesions.
IMPRESSION: Tube positions as described without pneumothorax. Left base
atelectasis. Lungs elsewhere clear. Heart size within normal limits.

## 2014-08-22 MED ORDER — BACID PO TABS
2.0000 | ORAL_TABLET | Freq: Three times a day (TID) | ORAL | Status: DC
  Administered 2014-08-22 – 2014-08-25 (×10): 2 via ORAL
  Filled 2014-08-22 (×13): qty 2

## 2014-08-22 MED ORDER — CHLORHEXIDINE GLUCONATE 0.12 % MT SOLN
15.0000 mL | Freq: Two times a day (BID) | OROMUCOSAL | Status: DC
  Administered 2014-08-23: 15 mL via OROMUCOSAL
  Filled 2014-08-22: qty 15

## 2014-08-22 MED ORDER — POTASSIUM CHLORIDE 10 MEQ/100ML IV SOLN
10.0000 meq | INTRAVENOUS | Status: AC
  Administered 2014-08-22 (×4): 10 meq via INTRAVENOUS
  Filled 2014-08-22 (×4): qty 100

## 2014-08-22 MED ORDER — LEVETIRACETAM 250 MG PO TABS
250.0000 mg | ORAL_TABLET | Freq: Two times a day (BID) | ORAL | Status: DC
  Administered 2014-08-22 – 2014-08-24 (×5): 250 mg via ORAL
  Filled 2014-08-22 (×7): qty 1

## 2014-08-22 MED ORDER — CETYLPYRIDINIUM CHLORIDE 0.05 % MT LIQD
7.0000 mL | Freq: Two times a day (BID) | OROMUCOSAL | Status: DC
  Administered 2014-08-22 (×2): 7 mL via OROMUCOSAL

## 2014-08-22 MED ORDER — SODIUM PHOSPHATE 3 MMOLE/ML IV SOLN
10.0000 mmol | Freq: Once | INTRAVENOUS | Status: AC
  Administered 2014-08-22: 10 mmol via INTRAVENOUS
  Filled 2014-08-22: qty 3.33

## 2014-08-22 MED ORDER — FENTANYL CITRATE (PF) 100 MCG/2ML IJ SOLN
12.5000 ug | INTRAMUSCULAR | Status: DC | PRN
  Administered 2014-08-22: 25 ug via INTRAVENOUS
  Filled 2014-08-22: qty 2

## 2014-08-22 NOTE — Progress Notes (Signed)
[facility] PCCM  Device interrogated by rep, no arrhythmia or bradycardia events yesterday.  Roselie Awkward, MD [place] PCCM Pager: [phone] Cell: [phone] After 3pm or if no response, call [phone]

## 2014-08-22 NOTE — Progress Notes (Signed)
Bedside EEG completed, results pending. 

## 2014-08-22 NOTE — Clinical Social Work Note (Signed)
CSW received handoff from ED CSW who informed this CSW that patient may need SNF placement.  CSW to continue to follow patient's progress throughout discharge planning.  Jones Broom. [place], MSW, [place] 08/22/2014 4:39 PM

## 2014-08-22 NOTE — Progress Notes (Signed)
[facility] Progress Note Patient Name: Stacey Velazquez DOB: 12/14/1945 MRN: [id number]   Date of Service  08/22/2014  HPI/Events of Note  Patient extubated this AM. Awake, alert and able to follow commands. Doing OK with sips of water.   eICU Interventions  Advance diet to clear liquid diet.      Intervention Category Minor Interventions: Routine modifications to care plan (e.g. PRN medications for pain, fever)  Blaine Guiffre [place] 08/22/2014, 7:32 PM

## 2014-08-22 NOTE — Procedures (Signed)
ELECTROENCEPHALOGRAM REPORT   Patient: Stacey Velazquez       Room #: [id number] EEG No. ID: [id number] Age: 68 y.o.        Sex: female Referring Physician: Nelda Marseille Report Date:  08/22/2014        Interpreting Physician: Alexis Goodell  History: Stacey Velazquez is an 69 y.o. female presenting after an episode of unresopnsiveness  Medications:  Scheduled: . antiseptic oral rinse  7 mL Mouth Rinse q12n4p  . aztreonam  1 g Intravenous 3 times per day  . chlorhexidine  15 mL Mouth Rinse BID  . heparin  5,000 Units Subcutaneous 3 times per day  . insulin aspart  2-6 Units Subcutaneous 6 times per day  . lactobacillus acidophilus  2 tablet Oral TID  . levETIRAcetam  250 mg Oral BID  . levothyroxine  100 mcg Per Tube QAC breakfast  . pantoprazole (PROTONIX) IV  40 mg Intravenous Q12H  . risperiDONE  1 mg Per Tube QHS  . vancomycin  1,250 mg Intravenous Q12H    Conditions of Recording:  This is a 16 channel EEG carried out with the patient in the awake state.  Description:  The waking background activity consists of a low voltage, symmetrical, fairly well organized, 9 Hz alpha activity, seen from the parieto-occipital and posterior temporal regions.  Low voltage fast activity, poorly organized, is seen anteriorly and is at times superimposed on more posterior regions.  A mixture of theta and alpha rhythms are seen from the central and temporal regions. The patient does not drowse or sleep. No epileptiform activity is noted.   Hyperventilation and intermittent photic stimulation were not performed.   IMPRESSION: This is a normal awake electroencephalogram.  No epileptiform activity is noted.    Comment:  An EEG with the patient sleep deprived to elicit drowse and light sleep may be desirable to further elicit a possible seizure disorder.     Alexis Goodell, MD [facility] [phone] 08/22/2014, 5:13 PM

## 2014-08-22 NOTE — H&P (Signed)
PULMONARY / CRITICAL CARE MEDICINE   Name: Stacey Velazquez MRN: [id number] DOB: [birth year]    ADMISSION DATE:  08/21/2014 CONSULTATION DATE:  7/27  REFERRING MD :  Cathleen Fears   CHIEF COMPLAINT:  Acute encephalopathy   INITIAL PRESENTATION:   69 year old female who was admitted 7/27 w/ sudden decreased LOC, vomiting and hypotension. She was intubated for airway protection. PCCM asked to admit for further eval and rx.  Of note had diffuse petechial rash on presentation.     STUDIES:  CT head 7/27: no acute findings   SIGNIFICANT EVENTS:   SUBJECTIVE:  Awake alert, following commands  VITAL SIGNS: Temp:  [97.4 F (36.3 C)-100.8 F (38.2 C)] 100.1 F (37.8 C) (07/28 0800) Pulse Rate:  [69-100] 75 (07/28 0800) Resp:  [9-20] 16 (07/28 0800) BP: (76-138)/(42-83) 123/78 mmHg (07/28 0800) SpO2:  [88 %-100 %] 100 % (07/28 0800) FiO2 (%):  [40 %-100 %] 40 % (07/28 0800) Weight:  [213 lb 10 oz (96.9 kg)-216 lb 0.8 oz (98 kg)] 216 lb 0.8 oz (98 kg) (07/28 0428) HEMODYNAMICS:   VENTILATOR SETTINGS: Vent Mode:  [-] PRVC FiO2 (%):  [40 %-100 %] 40 % Set Rate:  [15 bmp] 15 bmp Vt Set:  [570 mL] 570 mL PEEP:  [5 cmH20] 5 cmH20 Plateau Pressure:  [15 cmH20-18 cmH20] 15 cmH20 INTAKE / OUTPUT:  Intake/Output Summary (Last 24 hours) at 08/22/14 0928 Last data filed at 08/22/14 0800  Gross per 24 hour  Intake 2076.67 ml  Output   4485 ml  Net -2408.33 ml    PHYSICAL EXAMINATION: General:  Awake on vent HENT: NCAT EOMi PULM: CTA B CV: RRR, no mgr GI: BS+, soft, nontender MSK: normal bulk/tone Neuro: Awake, alert, following commands  LABS:  CBC  Recent Labs Lab 08/21/14 1100 08/21/14 1650 08/22/14 0205  WBC 2.0*  --  2.7*  HGB 13.6  --  12.8  HCT 39.5  --  37.1  PLT 91* 69* 79*   Coag's  Recent Labs Lab 08/21/14 1100 08/21/14 1650  APTT  --  33  INR 1.18 1.28   BMET  Recent Labs Lab 08/21/14 1100 08/22/14 0205  NA 135 136  K 3.9 3.0*  CL 106 107   CO2 21* 23  BUN 8 6  CREATININE 0.79 0.67  GLUCOSE 107* 121*   Electrolytes  Recent Labs Lab 08/21/14 1100 08/21/14 1650 08/22/14 0205  CALCIUM 8.2*  --  7.8*  MG  --  1.7 1.8  PHOS  --  2.7 2.0*   Sepsis Markers  Recent Labs Lab 08/21/14 1146 08/21/14 1650 08/22/14 0205  LATICACIDVEN 2.29*  --   --   PROCALCITON  --  <0.10 <0.10   ABG  Recent Labs Lab 08/21/14 1140 08/22/14 0530  PHART 7.426 7.558*  PCO2ART 31.8* 24.6*  PO2ART 377* 137*   Liver Enzymes  Recent Labs Lab 08/21/14 1100  AST 34  ALT 16  ALKPHOS 61  BILITOT 0.6  ALBUMIN 2.8*   Cardiac Enzymes No results for input(s): TROPONINI, PROBNP in the last 168 hours. Glucose  Recent Labs Lab 08/21/14 1704 08/21/14 1933 08/21/14 2335 08/22/14 0849  GLUCAP 108* 112* 120* 128*    Imaging  CXR 7/28 > ETT in place, pacer, ? Mild RLL infiltrate  ASSESSMENT / PLAN:  PULMONARY OETT 7/27>>>  A: Ineffective airway protection > resolved P:   Extubate Aspiration precautions  CARDIOVASCULAR CVL A:  H/o sinus arrest. Has perm pacemaker  Syncope 7/27 > seizure  or arrhythmia related? No clear infectious source P:  Tele  COntinue IVF Hold antihypertensives  Hold diuretics  Interrogate pacer today   RENAL A:   Hypokalemia P:   Monitor BMET and UOP Replace electrolytes as needed   GASTROINTESTINAL A:   Nausea and vomiting H/o autoimmune hepatitis  P:   Hold imuran again today Advance diet as able today Check ammonia  HEMATOLOGIC A:   Thrombocytopenia (chronic) Petechial rash  Chronic leukopenia > related to imuran ? P:  Monitor for bleeding   INFECTIOUS A:  Mild fever overnight, ? Aspiration but no clear source of infection 7/28 P:   BCx2  7/27>>> UC  7/27>>> Sputum 7/27>>>  azactam 7/27>>> cipro 7/27>>> 7/28 vanc 7/27>>>  Likely d/c all antibiotics 7/28 if afebrile   ENDOCRINE A:   Hypothyroidism  P:   Check TSH Cont synthroid   NEUROLOGIC A:    Acute encephalopathy > resolved, by history she had syncope > unclear if cardiac or related to her history of seizures  P:   D/c sedation protocol Continue risperdal Check EEG Restart keppra  FAMILY  - Updates: none available  - Inter-disciplinary family meet or Palliative Care meeting due by:  8/2    Critical Care Time devoted to patient care services described in this note is  45  Minutes. This time reflects time of care of this signee Dr Jennet Maduro. This critical care time does not reflect procedure time, or teaching time or supervisory time of PA/NP/Med student/Med Resident etc but could involve care discussion time.  Roselie Awkward, MD [place] PCCM Pager: [phone] Cell: [phone] After 3pm or if no response, call [phone]    08/22/2014, 9:28 AM

## 2014-08-22 NOTE — Progress Notes (Signed)
Utilization Review Completed.  

## 2014-08-22 NOTE — Procedures (Signed)
Extubation Procedure Note  Patient Details:   Name: Stacey Velazquez DOB: May 10, 1945 MRN: [id number]   Airway Documentation:  Airway 7.5 mm (Active)  Secured at (cm) 23 cm 08/22/2014  8:00 AM  Measured From Lips 08/22/2014  8:00 AM  Secured Location Right 08/22/2014  7:34 AM  Secured By [employer] 08/22/2014  7:34 AM  Tube Holder Repositioned Yes 08/22/2014  7:34 AM  Cuff Pressure (cm H2O) 24 cm H2O 08/21/2014  7:50 PM  Site Condition Dry 08/22/2014  8:00 AM    Evaluation  O2 sats: stable throughout Complications: No apparent complications Patient did tolerate procedure well. Bilateral Breath Sounds: Clear, Diminished Suctioning: Oral, Airway Yes  PT was extubated to 3L [place]. PT was able to tell us her name. Sats are stable  Stacey Velazquez, [name] 08/22/2014, 10:49 AM

## 2014-08-22 NOTE — Progress Notes (Signed)
[facility] ADULT ICU REPLACEMENT PROTOCOL FOR AM LAB REPLACEMENT ONLY  The patient does apply for the [facility] Adult ICU Electrolyte Replacment Protocol based on the criteria listed below:   1. Is GFR >/= 40 ml/min? Yes.    Patient's GFR today is >60 2. Is urine output >/= 0.5 ml/kg/hr for the last 6 hours? Yes.   Patient's UOP is 1.0 ml/kg/hr 3. Is BUN < 60 mg/dL? Yes.    Patient's BUN today is 6 4. Abnormal electrolyte(s): K3.0, phos2.0, Mg1.8 5. Ordered repletion with: per protocol 6. If a panic level lab has been reported, has the CCM MD in charge been notified? Yes.  .   Physician:  Janalyn Harder 08/22/2014 5:39 AM

## 2014-08-23 DIAGNOSIS — R55 Syncope and collapse: Secondary | ICD-10-CM | POA: Diagnosis present

## 2014-08-23 DIAGNOSIS — I959 Hypotension, unspecified: Secondary | ICD-10-CM | POA: Diagnosis present

## 2014-08-23 DIAGNOSIS — R569 Unspecified convulsions: Secondary | ICD-10-CM | POA: Diagnosis present

## 2014-08-23 DIAGNOSIS — E876 Hypokalemia: Secondary | ICD-10-CM | POA: Diagnosis present

## 2014-08-23 LAB — GLUCOSE, CAPILLARY
Glucose-Capillary: 109 mg/dL — ABNORMAL HIGH (ref 65–99)
Glucose-Capillary: 111 mg/dL — ABNORMAL HIGH (ref 65–99)
Glucose-Capillary: 133 mg/dL — ABNORMAL HIGH (ref 65–99)
Glucose-Capillary: 94 mg/dL (ref 65–99)
Glucose-Capillary: 98 mg/dL (ref 65–99)

## 2014-08-23 LAB — BASIC METABOLIC PANEL
Anion gap: 5 (ref 5–15)
BUN: <5 mg/dL — ABNORMAL LOW (ref 6–20)
CO2: 22 mmol/L (ref 22–32)
Calcium: 7.7 mg/dL — ABNORMAL LOW (ref 8.9–10.3)
Chloride: 110 mmol/L (ref 101–111)
Creatinine, Ser: 0.63 mg/dL (ref 0.44–1.00)
GFR calc Af Amer: >60 mL/min (ref >60)
GFR calc non Af Amer: >60 mL/min (ref >60)
Glucose, Bld: 105 mg/dL — ABNORMAL HIGH (ref 65–99)
Potassium: 3.3 mmol/L — ABNORMAL LOW (ref 3.5–5.1)
Sodium: 137 mmol/L (ref 135–145)

## 2014-08-23 LAB — CBC WITH DIFFERENTIAL/PLATELET
Basophils Absolute: 0 10*3/uL (ref 0.0–0.1)
Basophils Relative: 0 % (ref 0–1)
Eosinophils Absolute: 0 10*3/uL (ref 0.0–0.7)
Eosinophils Relative: 1 % (ref 0–5)
HCT: 34.1 % — ABNORMAL LOW (ref 36.0–46.0)
Hemoglobin: 12.1 g/dL (ref 12.0–15.0)
Lymphocytes Relative: 38 % (ref 12–46)
Lymphs Abs: 0.7 10*3/uL (ref 0.7–4.0)
MCH: 33.1 pg (ref 26.0–34.0)
MCHC: 35.5 g/dL (ref 30.0–36.0)
MCV: 93.2 fL (ref 78.0–100.0)
Monocytes Absolute: 0.2 10*3/uL (ref 0.1–1.0)
Monocytes Relative: 11 % (ref 3–12)
Neutro Abs: 0.9 10*3/uL — ABNORMAL LOW (ref 1.7–7.7)
Neutrophils Relative %: 50 % (ref 43–77)
Platelets: 106 10*3/uL — ABNORMAL LOW (ref 150–400)
RBC: 3.66 MIL/uL — ABNORMAL LOW (ref 3.87–5.11)
RDW: 15.2 % (ref 11.5–15.5)
WBC: 1.8 10*3/uL — ABNORMAL LOW (ref 4.0–10.5)

## 2014-08-23 LAB — URINE CULTURE: Special Requests: NORMAL

## 2014-08-23 LAB — PATHOLOGIST SMEAR REVIEW

## 2014-08-23 LAB — MAGNESIUM: Magnesium: 1.8 mg/dL (ref 1.7–2.4)

## 2014-08-23 LAB — PROCALCITONIN: Procalcitonin: <0.1 ng/mL

## 2014-08-23 LAB — PHOSPHORUS: Phosphorus: 3.1 mg/dL (ref 2.5–4.6)

## 2014-08-23 MED ORDER — CETYLPYRIDINIUM CHLORIDE 0.05 % MT LIQD
7.0000 mL | Freq: Four times a day (QID) | OROMUCOSAL | Status: DC
  Administered 2014-08-23 – 2014-08-25 (×3): 7 mL via OROMUCOSAL

## 2014-08-23 MED ORDER — POTASSIUM CHLORIDE 10 MEQ/100ML IV SOLN
10.0000 meq | INTRAVENOUS | Status: AC
  Administered 2014-08-23 (×4): 10 meq via INTRAVENOUS
  Filled 2014-08-23 (×4): qty 100

## 2014-08-23 MED ORDER — CHLORHEXIDINE GLUCONATE 0.12 % MT SOLN
15.0000 mL | Freq: Two times a day (BID) | OROMUCOSAL | Status: DC
  Administered 2014-08-24 – 2014-08-25 (×3): 15 mL via OROMUCOSAL
  Filled 2014-08-23 (×3): qty 15

## 2014-08-23 NOTE — Consult Note (Signed)
NEURO HOSPITALIST CONSULT NOTE   Referring physician: Mcquid   Reason for Consult: seizure versus syncope  HPI:                                                                                                                                          Stacey Velazquez is an 69 y.o. female who was at an adult daycare when she became unresponsive while sitting in her chair. She states she suddenly felt weak and then was unsure what was going on around her. She tried to yell out but could not then she does not recall event until in the EMS truck.  SApparently vomited while at the daycare and again in route to the hospital. It was unclear if the vomiting occurred prior to her AMS or after. EMS was called. On arrival to the ER she was hypotensive w/ her SBP in 70s, and lethargic. She was intubated for inability to protect airway. A CT of head was obtained and was negative for acute process. Her Pacemaker was interrogated and showed no abnormalities. EEG was obtained and showed no epileptiform activity or spikes. Urine and blood cultures were also negative. Unfortunately MRI of brain is not able to be obtained due to pace maker.   She states her last seizure was back in July 2014 and prior to that was back in 2013.  She often feels a aura --that of feeling nauseated.  She is unsure what a typical seizure looks like.  She has had a EEG in the past which was negative.   Past Medical History  Diagnosis Date  . Seizures   . Bipolar 1 disorder   . Sinus arrest   . Pacemaker [employer]     MRI compatible  . Hypotension   . Autoimmune hepatitis   . GERD (gastroesophageal reflux disease)   . Hypothyroidism     Past Surgical History  Procedure Laterality Date  . Pacemaker insertion      Family History  Problem Relation Age of Onset  . Heart attack Father   . Heart disease Paternal Uncle   . Heart disease Paternal Grandmother   . Diabetes Paternal Grandmother   . Heart disease  Paternal Grandfather   . Cancer Mother     BREAST  . Diabetes Sister   . Diabetes Brother   . Cancer Maternal Grandmother   . CVA Maternal Grandmother   . Cancer Maternal Grandfather   . CVA Maternal Grandfather   . Diabetes Brother      Social History:  reports that she has never smoked. She does not have any smokeless tobacco history on file. She reports that she does not drink alcohol or use illicit drugs.  Allergies  Allergen Reactions  . Ambien [Zolpidem Tartrate] Other (See Comments)  Hallucinations   . Amitriptyline Other (See Comments)    Reaction unknown  . Ampicillin Other (See Comments)    Stops heart per pt   . Anaprox [Naproxen Sodium] Other (See Comments)    Reaction unknown  . Benadryl [Diphenhydramine Hcl (Sleep)] Other (See Comments)    Rash  . Cetirizine & Related Other (See Comments)    Swelling   . Cortizone-10 [Hydrocortisone] Other (See Comments)    seizures   . Darvon [Propoxyphene] Other (See Comments)    Increased heart rate per pt   . Diazepam Other (See Comments)    Reaction unknown  . Diflunisal Other (See Comments)    Reaction unknown  . Duloxetine Other (See Comments)    Reaction unknown  . Flexeril [Cyclobenzaprine] Other (See Comments)    Seizures   . Lidocaine Other (See Comments)    Seizures  . Meperidine And Related Other (See Comments)    Reaction unknown  . Metanx [L-Methylfolate-Algae-B12-B6] Other (See Comments)    Reaction unknown  . Metoclopramide Other (See Comments)    Reaction unknown  . Morphine And Related Other (See Comments)    Stops heart per pt   . Naproxen   . Nuprin [Ibuprofen] Other (See Comments)    Reaction unknown  . Oxycodone Other (See Comments)    Hallucinations   . Penicillins Other (See Comments)    Stops heart per pt   . Propoxyphene Other (See Comments)    Reaction unknown  . Ranitidine Other (See Comments)    Reaction unknown  . Tylenol [Acetaminophen] Other (See Comments)     Reaction unknown  . Vicodin [Hydrocodone-Acetaminophen] Other (See Comments)    Seizures     MEDICATIONS:                                                                                                                     Prior to Admission:  Prescriptions prior to admission  Medication Sig Dispense Refill Last Dose  . azaTHIOprine (IMURAN) 50 MG tablet Take 2 tablets (100 mg total) by mouth daily. 60 tablet 4 Unknown at Unknown time  . Cholecalciferol (VITAMIN D) 1000 UNITS capsule Take 1,000 Units by mouth daily.   Unknown at Unknown time  . furosemide (LASIX) 20 MG tablet Take 1 tablet (20 mg total) by mouth daily. 90 tablet 3 Unknown at Unknown time  . ketoprofen (ORUDIS) 75 MG capsule Take 1 capsule (75 mg total) by mouth 3 (three) times daily as needed (for headache). 30 capsule 0 Unknown at Unknown time  . Lactobacillus (LACTINEX) PACK Take 1 each by mouth daily as needed (for lactose intolerance).    Unknown at Unknown time  . levETIRAcetam (KEPPRA) 250 MG tablet Take 1 tablet (250 mg total) by mouth 2 (two) times daily. 60 tablet 4 Unknown at Unknown time  . levothyroxine (SYNTHROID, LEVOTHROID) 100 MCG tablet Take 1 tablet (100 mcg total) by mouth daily before breakfast. 30 tablet 4 Unknown at Unknown time  . meclizine (ANTIVERT) 12.5  MG tablet Take 1 tablet (12.5 mg total) by mouth 3 (three) times daily as needed for dizziness. 90 tablet 2 Unknown at Unknown time  . risperiDONE (RISPERDAL) 3 MG tablet Take 3 mg by mouth at bedtime.   Unknown at Unknown time   Scheduled: . antiseptic oral rinse  7 mL Mouth Rinse q12n4p  . chlorhexidine  15 mL Mouth Rinse BID  . heparin  5,000 Units Subcutaneous 3 times per day  . insulin aspart  2-6 Units Subcutaneous 6 times per day  . lactobacillus acidophilus  2 tablet Oral TID  . levETIRAcetam  250 mg Oral BID  . levothyroxine  100 mcg Per Tube QAC breakfast  . risperiDONE  1 mg Per Tube QHS     ROS:                                                                                                                                        History obtained from the patient  General ROS: negative for - chills, fatigue, fever, night sweats, weight gain or weight loss Psychological ROS: negative for - behavioral disorder, hallucinations, memory difficulties, mood swings or suicidal ideation Ophthalmic ROS: negative for - blurry vision, double vision, eye pain or loss of vision ENT ROS: negative for - epistaxis, nasal discharge, oral lesions, sore throat, tinnitus or vertigo Allergy and Immunology ROS: negative for - hives or itchy/watery eyes Hematological and Lymphatic ROS: negative for - bleeding problems, bruising or swollen lymph nodes Endocrine ROS: negative for - galactorrhea, hair pattern changes, polydipsia/polyuria or temperature intolerance Respiratory ROS: negative for - cough, hemoptysis, shortness of breath or wheezing Cardiovascular ROS: negative for - chest pain, dyspnea on exertion, edema or irregular heartbeat Gastrointestinal ROS: negative for - abdominal pain, diarrhea, hematemesis, nausea/vomiting or stool incontinence Genito-Urinary ROS: negative for - dysuria, hematuria, incontinence or urinary frequency/urgency Musculoskeletal ROS: negative for - joint swelling or muscular weakness Neurological ROS: as noted in HPI Dermatological ROS: negative for rash and skin lesion changes   Blood pressure 122/75, pulse 73, temperature 98.9 F (37.2 C), temperature source Oral, resp. rate 16, height 5\' 11"  (1.803 m), weight 97.3 kg (214 lb 8.1 oz), SpO2 94 %.   Physical Examination:                                                                                                      HEENT-  Normocephalic, no lesions, without obvious abnormality.  Normal external eye and conjunctiva.  Normal TM's  bilaterally.  Normal auditory canals and external ears. Normal external nose, mucus membranes and septum.  Normal  pharynx. Cardiovascular- S1, S2 normal, pulses palpable throughout   Lungs- chest clear, no wheezing, rales, normal symmetric air entry Abdomen- normal findings: bowel sounds normal Extremities- no edema Lymph-no adenopathy palpable Musculoskeletal-no joint tenderness, deformity or swelling Skin-warm and dry, no hyperpigmentation, vitiligo, or suspicious lesions  Neurological Examination Mental Status: Alert, oriented, thought content appropriate.  Speech fluent without evidence of aphasia.  Able to follow 3 step commands without difficulty. Cranial Nerves: II: Discs flat bilaterally; Visual fields grossly normal, pupils equal, round, reactive to light and accommodation III,IV, VI: ptosis not present, extra-ocular motions intact bilaterally V,VII: smile asymmetric on the right, facial light touch sensation normal bilaterally VIII: hearing normal bilaterally IX,X: uvula rises symmetrically XI: bilateral shoulder shrug XII: midline tongue extension Motor: Right : Upper extremity   4/5  (Old)   Left:     Upper extremity   5/5  Lower extremity   40/5 (old)    Lower extremity   5/5 Tone and bulk:normal tone throughout; no atrophy noted Sensory: Pinprick and light touch intact throughout, bilaterally Deep Tendon Reflexes: 2+ and symmetric throughout UE no KJ or AJ Plantars: Mute bilaterally Cerebellar: normal finger-to-nose,  and normal heel-to-shin test Gait: not tested due to patient safety      Lab Results: Basic Metabolic Panel:  Recent Labs Lab 08/21/14 1100 08/21/14 1650 08/22/14 0205 08/23/14 0232  NA 135  --  136 137  K 3.9  --  3.0* 3.3*  CL 106  --  107 110  CO2 21*  --  23 22  GLUCOSE 107*  --  121* 105*  BUN 8  --  6 <5*  CREATININE 0.79  --  0.67 0.63  CALCIUM 8.2*  --  7.8* 7.7*  MG  --  1.7 1.8 1.8  PHOS  --  2.7 2.0* 3.1    Liver Function Tests:  Recent Labs Lab 08/21/14 1100  AST 34  ALT 16  ALKPHOS 61  BILITOT 0.6  PROT 7.3  ALBUMIN 2.8*     Recent Labs Lab 08/21/14 1650  LIPASE 20*  AMYLASE 452*    Recent Labs Lab 08/22/14 1006  AMMONIA 28    CBC:  Recent Labs Lab 08/21/14 1100 08/21/14 1650 08/22/14 0205 08/23/14 0232  WBC 2.0*  --  2.7* 1.8*  NEUTROABS  --   --   --  0.9*  HGB 13.6  --  12.8 12.1  HCT 39.5  --  37.1 34.1*  MCV 93.4  --  92.8 93.2  PLT 91* 69* 79* 106*    Cardiac Enzymes: No results for input(s): CKTOTAL, CKMB, CKMBINDEX, TROPONINI in the last 168 hours.  Lipid Panel: No results for input(s): CHOL, TRIG, HDL, CHOLHDL, VLDL, LDLCALC in the last 168 hours.  CBG:  Recent Labs Lab 08/22/14 1603 08/22/14 1937 08/22/14 2333 08/23/14 0339 08/23/14 0820  GLUCAP 112* 117* 79 133* 36    Microbiology: Results for orders placed or performed during the hospital encounter of 08/21/14  Blood culture (routine x 2)     Status: None (Preliminary result)   Collection Time: 08/21/14 11:00 AM  Result Value Ref Range Status   Specimen Description BLOOD LEFT ANTECUBITAL  Final   Special Requests BOTTLES DRAWN AEROBIC AND ANAEROBIC 5CC  Final   Culture NO GROWTH 1 DAY  Final   Report Status PENDING  Incomplete  Blood culture (routine x 2)  Status: None (Preliminary result)   Collection Time: 08/21/14 11:42 AM  Result Value Ref Range Status   Specimen Description BLOOD RIGHT ANTECUBITAL  Final   Special Requests BOTTLES DRAWN AEROBIC AND ANAEROBIC 5CC  Final   Culture NO GROWTH 1 DAY  Final   Report Status PENDING  Incomplete  Urine culture     Status: None (Preliminary result)   Collection Time: 08/21/14 11:50 AM  Result Value Ref Range Status   Specimen Description URINE, CATHETERIZED  Final   Special Requests Normal  Final   Culture CULTURE REINCUBATED FOR BETTER GROWTH  Final   Report Status PENDING  Incomplete  MRSA PCR Screening     Status: None   Collection Time: 08/21/14  6:45 PM  Result Value Ref Range Status   MRSA by PCR NEGATIVE NEGATIVE Final    Comment:         The GeneXpert MRSA Assay (FDA approved for NASAL specimens only), is one component of a comprehensive MRSA colonization surveillance program. It is not intended to diagnose MRSA infection nor to guide or monitor treatment for MRSA infections.     Coagulation Studies:  Recent Labs  08/21/14 1100 08/21/14 1650  LABPROT 15.1 16.1*  INR 1.18 1.28    Imaging: Ct Head Wo Contrast  08/21/2014   CLINICAL DATA:  Unresponsive, hypotensive, history of seizures, bipolar disorder  EXAM: CT HEAD WITHOUT CONTRAST  TECHNIQUE: Contiguous axial images were obtained from the base of the skull through the vertex without intravenous contrast.  COMPARISON:  10/31/2013  FINDINGS: There is no evidence of mass effect, midline shift, or extra-axial fluid collections. There is no evidence of a space-occupying lesion or intracranial hemorrhage. There is no evidence of a cortical-based area of acute infarction. There is generalized cerebral atrophy. There is periventricular white matter low attenuation likely secondary to microangiopathy.  The ventricles and sulci are appropriate for the patient's age. The basal cisterns are patent.  Visualized portions of the orbits are unremarkable. The visualized portions of the paranasal sinuses and mastoid air cells are unremarkable.  The osseous structures are unremarkable.  IMPRESSION: 1. No acute intracranial pathology. 2. Chronic microvascular disease and cerebral atrophy.   Electronically Signed   By: Kathreen Devoid   On: 08/21/2014 12:30   Dg Chest Port 1 View  08/22/2014   CLINICAL DATA:  Hypoxia  EXAM: PORTABLE CHEST - 1 VIEW  COMPARISON:  August 21, 2014  FINDINGS: Endotracheal tube tip is 1.8 cm above the carina. Nasogastric tube tip and side port are below the diaphragm. Pacemaker leads are attached to the right atrium and right ventricle. No pneumothorax. There is patchy atelectasis in the left base. Lungs elsewhere clear. Heart size and pulmonary vascularity are  normal. No adenopathy. No bone lesions.  IMPRESSION: Tube positions as described without pneumothorax. Left base atelectasis. Lungs elsewhere clear. Heart size within normal limits.   Electronically Signed   By: Lowella Grip III M.D.   On: 08/22/2014 07:58   Dg Chest Portable 1 View  08/21/2014   CLINICAL DATA:  Intubation  EXAM: PORTABLE CHEST - 1 VIEW  COMPARISON:  None.  FINDINGS: Lungs are under aerated with bibasilar atelectasis. Endotracheal tube in place with its tip 2.0 cm from the carina. NG tube in place. Tip is beyond the gastroesophageal junction. Upper lungs clear. Normal heart size.  IMPRESSION: Endotracheal and NG tubes as described.  Bibasilar atelectasis.   Electronically Signed   By: Marybelle Killings M.D.   On: 08/21/2014  11:42   Dg Abd Portable 1v  08/21/2014   CLINICAL DATA:  Vomiting.  EXAM: PORTABLE ABDOMEN - 1 VIEW  COMPARISON:  None.  FINDINGS: The bowel gas pattern is normal. Nasogastric tube tip is seen in expected position of distal stomach. Surgical clips and phleboliths are noted in the pelvis.  IMPRESSION: No evidence of bowel obstruction or ileus.   Electronically Signed   By: Marijo Conception, M.D.   On: 08/21/2014 14:49       Assessment and plan per attending neurologist  Etta Quill PA-C [facility] [phone]  08/23/2014, 11:00 AM   Assessment/Plan:  69 YO female with period of AMS and unclear etiology.  She has had history of stroke with right sided deficits and states she has a history of seizures for which she is on 250 mg Keppra BID. Given her low dose of keppra and the prolonged period of AMS cannot fully exclude possibility of a recurrent seizure.   Recommend: 1) Increase Keppra to 500 mg BID 2) Have follow up with put patient neurology for prolonged   Patient seen and examined together with physician assistant and I concur with the assessment and plan.  Dorian Pod, MD

## 2014-08-23 NOTE — Progress Notes (Signed)
PULMONARY / CRITICAL CARE MEDICINE   Name: Stacey Velazquez MRN: [id number] DOB: 01-Nov-1945    ADMISSION DATE:  08/21/2014 CONSULTATION DATE:  7/27  REFERRING MD :  Cathleen Fears   CHIEF COMPLAINT:  Acute encephalopathy   INITIAL PRESENTATION:   69 year old female who was admitted 7/27 w/ sudden decreased LOC, vomiting and hypotension. She was intubated for airway protection. PCCM asked to admit for further eval and rx.  Of note had diffuse petechial rash on presentation.     STUDIES:  CT head 7/27: no acute findings  EEG 7/28 : normal awake EEG Pacemaker device interrogation 7/28 : no brady events or other alarms identified  SIGNIFICANT EVENTS: 7/28 Extubated  SUBJECTIVE:  Extubated 7/28, she says that she was in her usual state of health prior to feeling suddenly very weak and then she blacked out prior to admission, no prior fever or chills, nausea or vomiting, she tells me she was compliant with her medications  VITAL SIGNS: Temp:  [98.3 F (36.8 C)-100 F (37.8 C)] 98.9 F (37.2 C) (07/29 0822) Pulse Rate:  [73-94] 73 (07/29 0800) Resp:  [14-21] 16 (07/29 0800) BP: (102-141)/(59-87) 122/75 mmHg (07/29 0800) SpO2:  [82 %-100 %] 94 % (07/29 0800) Weight:  [214 lb 8.1 oz (97.3 kg)] 214 lb 8.1 oz (97.3 kg) (07/29 0500) HEMODYNAMICS:   VENTILATOR SETTINGS:   INTAKE / OUTPUT:  Intake/Output Summary (Last 24 hours) at 08/23/14 0957 Last data filed at 08/23/14 0803  Gross per 24 hour  Intake   3750 ml  Output   4650 ml  Net   -900 ml    PHYSICAL EXAMINATION: General:  Awake and alert HENT: NCAT EOMi PULM: CTA B CV: RRR, no mgr GI: BS+, soft, nontender MSK: normal bulk/tone Neuro: Awake, alert, oriented to situation, location, moves all four ext  LABS:  CBC  Recent Labs Lab 08/21/14 1100 08/21/14 1650 08/22/14 0205 08/23/14 0232  WBC 2.0*  --  2.7* 1.8*  HGB 13.6  --  12.8 12.1  HCT 39.5  --  37.1 34.1*  PLT 91* 69* 79* 106*   Coag's  Recent Labs Lab  08/21/14 1100 08/21/14 1650  APTT  --  33  INR 1.18 1.28   BMET  Recent Labs Lab 08/21/14 1100 08/22/14 0205 08/23/14 0232  NA 135 136 137  K 3.9 3.0* 3.3*  CL 106 107 110  CO2 21* 23 22  BUN 8 6 <5*  CREATININE 0.79 0.67 0.63  GLUCOSE 107* 121* 105*   Electrolytes  Recent Labs Lab 08/21/14 1100 08/21/14 1650 08/22/14 0205 08/23/14 0232  CALCIUM 8.2*  --  7.8* 7.7*  MG  --  1.7 1.8 1.8  PHOS  --  2.7 2.0* 3.1   Sepsis Markers  Recent Labs Lab 08/21/14 1146 08/21/14 1650 08/22/14 0205 08/23/14 0232  LATICACIDVEN 2.29*  --   --   --   PROCALCITON  --  <0.10 <0.10 <0.10   ABG  Recent Labs Lab 08/21/14 1140 08/22/14 0530  PHART 7.426 7.558*  PCO2ART 31.8* 24.6*  PO2ART 377* 137*   Liver Enzymes  Recent Labs Lab 08/21/14 1100  AST 34  ALT 16  ALKPHOS 61  BILITOT 0.6  ALBUMIN 2.8*   Cardiac Enzymes No results for input(s): TROPONINI, PROBNP in the last 168 hours. Glucose  Recent Labs Lab 08/22/14 1222 08/22/14 1603 08/22/14 1937 08/22/14 2333 08/23/14 0339 08/23/14 0820  GLUCAP 110* 112* 117* 79 133* 98    Imaging  CXR  7/28 > ETT in place, pacer, ? Mild RLL infiltrate  ASSESSMENT / PLAN:  NEUROLOGIC A:   Syncope leading to acute encephalopathy > resolved, but severe episode (required intubation) and no clear cause as EEG and pacer interrogation normal P:   Neurology consult today > MRI?  Will order carotid doppler, echo Continue risperdal Continue keppra   CARDIOVASCULAR CVL A:   Syncope 7/27 > seizure or arrhythmia related? No clear case, pacer interrogation normal  H/o sinus arrest. Has perm pacemaker  P:  Tele  Continue IVF Continue to hold antihypertensives and  diuretics for now See neuro   PULMONARY OETT 7/27>>> 7/28 A: Ineffective airway protection > resolved P:   Aspiration precautions Monitor O2 saturation Out of bed   RENAL A:   Hypokalemia P:   Monitor BMET and UOP Replace electrolytes as  needed   GASTROINTESTINAL A:   Nausea and vomiting H/o autoimmune hepatitis  P:   Hold imuran again today with leukocytosis, restart prior to discharge Regular diet  HEMATOLOGIC A:   Thrombocytopenia (chronic) > improved Chronic leukopenia > related to imuran ? P:  Monitor for bleeding   INFECTIOUS A:  Never had convincing evidence of sepsis or clear cut infection P:   BCx2  7/27>>> UC  7/27>>> Sputum 7/27>>>  azactam 7/27>>> 7/28  cipro 7/27>>> 7/28 vanc 7/27>>> 7/28   d/c all antibiotics   ENDOCRINE A:   Hypothyroidism  P:   Check TSH Cont synthroid     FAMILY  - Updates: none available  - Inter-disciplinary family meet or Palliative Care meeting due by:  8/2   Roselie Awkward, MD [place] [facility] Pager: [phone] Cell: [phone] After 3pm or if no response, call [phone]    08/23/2014, 9:57 AM

## 2014-08-23 NOTE — Progress Notes (Signed)
[facility] Progress Note Patient Name: Stacey Velazquez DOB: [birth year] MRN: [id number]   Date of Service  08/23/2014  HPI/Events of Note  Hypokalemia  eICU Interventions  Potassium replaced     Intervention Category Intermediate Interventions: Electrolyte abnormality - evaluation and management  Keigan Girten 08/23/2014, 3:36 AM

## 2014-08-24 ENCOUNTER — Inpatient Hospital Stay (HOSPITAL_COMMUNITY): Payer: Medicare Other

## 2014-08-24 DIAGNOSIS — E876 Hypokalemia: Secondary | ICD-10-CM

## 2014-08-24 DIAGNOSIS — R569 Unspecified convulsions: Secondary | ICD-10-CM

## 2014-08-24 DIAGNOSIS — G9340 Encephalopathy, unspecified: Secondary | ICD-10-CM

## 2014-08-24 DIAGNOSIS — F319 Bipolar disorder, unspecified: Secondary | ICD-10-CM

## 2014-08-24 DIAGNOSIS — R55 Syncope and collapse: Principal | ICD-10-CM

## 2014-08-24 LAB — GLUCOSE, CAPILLARY
Glucose-Capillary: 115 mg/dL — ABNORMAL HIGH (ref 65–99)
Glucose-Capillary: 118 mg/dL — ABNORMAL HIGH (ref 65–99)
Glucose-Capillary: 120 mg/dL — ABNORMAL HIGH (ref 65–99)
Glucose-Capillary: 87 mg/dL (ref 65–99)
Glucose-Capillary: 111 mg/dL — ABNORMAL HIGH (ref 65–99)
Glucose-Capillary: 109 mg/dL — ABNORMAL HIGH (ref 65–99)

## 2014-08-24 LAB — BASIC METABOLIC PANEL
Anion gap: 3 — ABNORMAL LOW (ref 5–15)
BUN: <5 mg/dL — ABNORMAL LOW (ref 6–20)
CO2: 23 mmol/L (ref 22–32)
Calcium: 7.9 mg/dL — ABNORMAL LOW (ref 8.9–10.3)
Chloride: 111 mmol/L (ref 101–111)
Creatinine, Ser: 0.65 mg/dL (ref 0.44–1.00)
GFR calc Af Amer: >60 mL/min (ref >60)
GFR calc non Af Amer: >60 mL/min (ref >60)
Glucose, Bld: 103 mg/dL — ABNORMAL HIGH (ref 65–99)
Potassium: 3.6 mmol/L (ref 3.5–5.1)
Sodium: 137 mmol/L (ref 135–145)

## 2014-08-24 MED ORDER — BENZONATATE 100 MG PO CAPS
200.0000 mg | ORAL_CAPSULE | Freq: Three times a day (TID) | ORAL | Status: DC
  Administered 2014-08-24 – 2014-08-25 (×3): 200 mg via ORAL
  Filled 2014-08-24 (×3): qty 2

## 2014-08-24 MED ORDER — LEVETIRACETAM 500 MG PO TABS
500.0000 mg | ORAL_TABLET | Freq: Two times a day (BID) | ORAL | Status: DC
  Administered 2014-08-24 – 2014-08-25 (×2): 500 mg via ORAL
  Filled 2014-08-24 (×2): qty 1

## 2014-08-24 NOTE — Progress Notes (Signed)
[facility] PROGRESS NOTE  Stacey Velazquez [id number] DOB: 1945/05/19 DOA: 08/21/2014 PCP: Lorayne Marek, MD  Assessment/Plan: #1 acute encephalopathy/syncope Concern for possible seizure related. Patient with no further episodes. Clinical improvement. Patient's pacemaker was interrogated and was normal. CT head was negative for any acute abnormalities. Unable to perform MRI of the head secondary to pacemaker. EEG negative. 2-D echo is pending. Carotid Dopplers are pending. Patient has been seen in consultation by neurology who recommended increasing patient's Keppra dose to 500 mg twice daily. Follow.  #2 ineffective airway protection Secondary to problem #1. Resolved.  #3 history of seizure disorder See problem #1. Keppra dose has been increased to 500 mg twice daily. Outpatient follow-up with neurologist.  #4 hypokalemia Repleted.  #5 nausea vomiting/history of autoimmune hepatitis Patient with no further emesis. Imuran on hold. Will resume on discharge.  #6 thrombocytopenia chronic/chronic leukopenia #Opinion has improved. Chronic leukopenia could be secondary to Imuran. Monitor for bleeding. Follow.  #7 hypothyroidism TSH is 0.349. Continue home dose Synthroid.   #8 bipolar disorder Continue risperdal  #9 prophylaxis Heparin for DVT prophylaxis.  Code Status: Full Family Communication: Updated patient and friend at bedside. Disposition Plan: Back to group home in 1-2 days.   Consultants:  pccm  Procedures: OETT 7/27>>> 7/28  Antibiotics: azactam 7/27>>> 7/28  cipro 7/27>>> 7/28 vanc 7/27>>> 7/28   HPI/Subjective: Patient complaining of nonproductive cough. Patient denies any chest pain. No shortness of breath. No further syncopal episodes.  Objective: Filed Vitals:   08/24/14 1334  BP: 135/77  Pulse: 82  Temp: 98.3 F (36.8 C)  Resp: 20    Intake/Output Summary (Last 24 hours) at 08/24/14 1645 Last data filed at 08/24/14 1125  Gross per  24 hour  Intake      0 ml  Output    601 ml  Net   -601 ml   Filed Weights   08/22/14 0428 08/23/14 0500 08/23/14 2028  Weight: 98 kg (216 lb 0.8 oz) 97.3 kg (214 lb 8.1 oz) 94.212 kg (207 lb 11.2 oz)    Exam:   General:  NAD  Cardiovascular: RRR  Respiratory: CTAB  Abdomen: Soft, nontender, nondistended, positive bowel sounds.  Musculoskeletal: No clubbing cyanosis or edema.  Data Reviewed: Basic Metabolic Panel:  Recent Labs Lab 08/21/14 1100 08/21/14 1650 08/22/14 0205 08/23/14 0232 08/24/14 0513  NA 135  --  136 137 137  K 3.9  --  3.0* 3.3* 3.6  CL 106  --  107 110 111  CO2 21*  --  23 22 23   GLUCOSE 107*  --  121* 105* 103*  BUN 8  --  6 <5* <5*  CREATININE 0.79  --  0.67 0.63 0.65  CALCIUM 8.2*  --  7.8* 7.7* 7.9*  MG  --  1.7 1.8 1.8  --   PHOS  --  2.7 2.0* 3.1  --    Liver Function Tests:  Recent Labs Lab 08/21/14 1100  AST 34  ALT 16  ALKPHOS 61  BILITOT 0.6  PROT 7.3  ALBUMIN 2.8*    Recent Labs Lab 08/21/14 1650  LIPASE 20*  AMYLASE 452*    Recent Labs Lab 08/22/14 1006  AMMONIA 28   CBC:  Recent Labs Lab 08/21/14 1100 08/21/14 1650 08/22/14 0205 08/23/14 0232  WBC 2.0*  --  2.7* 1.8*  NEUTROABS  --   --   --  0.9*  HGB 13.6  --  12.8 12.1  HCT 39.5  --  37.1  34.1*  MCV 93.4  --  92.8 93.2  PLT 91* 69* 79* 106*   Cardiac Enzymes: No results for input(s): CKTOTAL, CKMB, CKMBINDEX, TROPONINI in the last 168 hours. BNP (last 3 results) No results for input(s): BNP in the last 8760 hours.  ProBNP (last 3 results) No results for input(s): PROBNP in the last 8760 hours.  CBG:  Recent Labs Lab 08/23/14 2344 08/24/14 0337 08/24/14 0952 08/24/14 1116 08/24/14 1610  GLUCAP 111* 118* 120* 87 111*    Recent Results (from the past 240 hour(s))  Blood culture (routine x 2)     Status: None (Preliminary result)   Collection Time: 08/21/14 11:00 AM  Result Value Ref Range Status   Specimen Description BLOOD LEFT  ANTECUBITAL  Final   Special Requests BOTTLES DRAWN AEROBIC AND ANAEROBIC 5CC  Final   Culture NO GROWTH 3 DAYS  Final   Report Status PENDING  Incomplete  Blood culture (routine x 2)     Status: None (Preliminary result)   Collection Time: 08/21/14 11:42 AM  Result Value Ref Range Status   Specimen Description BLOOD RIGHT ANTECUBITAL  Final   Special Requests BOTTLES DRAWN AEROBIC AND ANAEROBIC 5CC  Final   Culture NO GROWTH 3 DAYS  Final   Report Status PENDING  Incomplete  Urine culture     Status: None   Collection Time: 08/21/14 11:50 AM  Result Value Ref Range Status   Specimen Description URINE, CATHETERIZED  Final   Special Requests Normal  Final   Culture MULTIPLE SPECIES PRESENT, SUGGEST RECOLLECTION  Final   Report Status 08/23/2014 FINAL  Final  MRSA PCR Screening     Status: None   Collection Time: 08/21/14  6:45 PM  Result Value Ref Range Status   MRSA by PCR NEGATIVE NEGATIVE Final    Comment:        The GeneXpert MRSA Assay (FDA approved for NASAL specimens only), is one component of a comprehensive MRSA colonization surveillance program. It is not intended to diagnose MRSA infection nor to guide or monitor treatment for MRSA infections.      Studies: No results found.  Scheduled Meds: . antiseptic oral rinse  7 mL Mouth Rinse QID  . chlorhexidine  15 mL Mouth Rinse BID  . heparin  5,000 Units Subcutaneous 3 times per day  . insulin aspart  2-6 Units Subcutaneous 6 times per day  . lactobacillus acidophilus  2 tablet Oral TID  . levETIRAcetam  250 mg Oral BID  . levothyroxine  100 mcg Per Tube QAC breakfast  . risperiDONE  1 mg Per Tube QHS   Continuous Infusions:   Principal Problem:   Acute encephalopathy Active Problems:   Syncope   Seizures   Bipolar 1 disorder   Arterial hypotension   Hypokalemia    Time spent: Wesson MD [facility] Pager [phone]. If 7PM-7AM, please contact night-coverage at  [email or web address], password [facility] 08/24/2014, 4:45 PM  LOS: 3 days

## 2014-08-24 NOTE — Progress Notes (Signed)
Attempted echocardiogram 3 times. First, the patient was eating. The second time, the patient was in the recliner and I requested that she be put in the bed. The third time, patient still has not been put back in the bed. I cannot complete the echocardiogram with the patient sitting in the recliner.

## 2014-08-24 NOTE — Progress Notes (Signed)
  Echocardiogram 2D Echocardiogram has been performed.  Stacey Velazquez 08/24/2014, 3:06 PM

## 2014-08-25 ENCOUNTER — Inpatient Hospital Stay (HOSPITAL_COMMUNITY): Payer: Medicare Other

## 2014-08-25 ENCOUNTER — Encounter (HOSPITAL_COMMUNITY): Payer: Self-pay | Admitting: Internal Medicine

## 2014-08-25 DIAGNOSIS — K754 Autoimmune hepatitis: Secondary | ICD-10-CM

## 2014-08-25 DIAGNOSIS — D72819 Decreased white blood cell count, unspecified: Secondary | ICD-10-CM | POA: Diagnosis present

## 2014-08-25 DIAGNOSIS — T451X1A Poisoning by antineoplastic and immunosuppressive drugs, accidental (unintentional), initial encounter: Secondary | ICD-10-CM

## 2014-08-25 DIAGNOSIS — D61818 Other pancytopenia: Secondary | ICD-10-CM | POA: Diagnosis present

## 2014-08-25 DIAGNOSIS — D702 Other drug-induced agranulocytosis: Secondary | ICD-10-CM

## 2014-08-25 DIAGNOSIS — T451X5A Adverse effect of antineoplastic and immunosuppressive drugs, initial encounter: Secondary | ICD-10-CM | POA: Diagnosis present

## 2014-08-25 DIAGNOSIS — D6959 Other secondary thrombocytopenia: Secondary | ICD-10-CM | POA: Diagnosis present

## 2014-08-25 DIAGNOSIS — R55 Syncope and collapse: Secondary | ICD-10-CM

## 2014-08-25 DIAGNOSIS — T50905A Adverse effect of unspecified drugs, medicaments and biological substances, initial encounter: Secondary | ICD-10-CM | POA: Diagnosis present

## 2014-08-25 HISTORY — DX: Autoimmune hepatitis: K75.4

## 2014-08-25 LAB — BASIC METABOLIC PANEL
Anion gap: 4 — ABNORMAL LOW (ref 5–15)
BUN: 9 mg/dL (ref 6–20)
CO2: 24 mmol/L (ref 22–32)
Calcium: 8.2 mg/dL — ABNORMAL LOW (ref 8.9–10.3)
Chloride: 108 mmol/L (ref 101–111)
Creatinine, Ser: 0.64 mg/dL (ref 0.44–1.00)
GFR calc Af Amer: >60 mL/min (ref >60)
GFR calc non Af Amer: >60 mL/min (ref >60)
Glucose, Bld: 99 mg/dL (ref 65–99)
Potassium: 3.4 mmol/L — ABNORMAL LOW (ref 3.5–5.1)
Sodium: 136 mmol/L (ref 135–145)

## 2014-08-25 LAB — GLUCOSE, CAPILLARY
Glucose-Capillary: 109 mg/dL — ABNORMAL HIGH (ref 65–99)
Glucose-Capillary: 109 mg/dL — ABNORMAL HIGH (ref 65–99)
Glucose-Capillary: 116 mg/dL — ABNORMAL HIGH (ref 65–99)
Glucose-Capillary: 95 mg/dL (ref 65–99)

## 2014-08-25 LAB — CBC
HCT: 34.5 % — ABNORMAL LOW (ref 36.0–46.0)
Hemoglobin: 12.2 g/dL (ref 12.0–15.0)
MCH: 32.3 pg (ref 26.0–34.0)
MCHC: 35.4 g/dL (ref 30.0–36.0)
MCV: 91.3 fL (ref 78.0–100.0)
Platelets: 81 10*3/uL — ABNORMAL LOW (ref 150–400)
RBC: 3.78 MIL/uL — ABNORMAL LOW (ref 3.87–5.11)
RDW: 14.9 % (ref 11.5–15.5)
WBC: 1.4 10*3/uL — CL (ref 4.0–10.5)

## 2014-08-25 MED ORDER — LEVETIRACETAM 500 MG PO TABS: 500.0000 mg | ORAL_TABLET | Freq: Two times a day (BID) | ORAL | Status: DC

## 2014-08-25 MED ORDER — BENZONATATE 200 MG PO CAPS: 200.0000 mg | ORAL_CAPSULE | Freq: Three times a day (TID) | ORAL | Status: DC

## 2014-08-25 NOTE — Progress Notes (Signed)
CRITICAL VALUE ALERT  Critical value received: WBC 1.4  Date of notification: 08/24/13  Time of notification:  0830  Critical value read back:yes  Nurse who received alert:  Honor Loh Rn  MD notified (1st page):  [place] Silos via text page

## 2014-08-25 NOTE — Progress Notes (Signed)
VASCULAR LAB PRELIMINARY  PRELIMINARY  PRELIMINARY  PRELIMINARY  Carotid Duplex completed.    Preliminary report:  1-39% ICA stenosis.  Vertebral artery flow is antegrade.   Peng Thorstenson, RVT 08/25/2014, 12:02 PM

## 2014-08-25 NOTE — Progress Notes (Addendum)
CSW informed [facility] [phone]) of plan for patient DC today-pt will be picked up by private vehicle and [facility].  DC packet by chart for [facility] to [facility]  CSW will continue to follow.   Domenica Reamer, [place] Social Worker [phone]

## 2014-08-25 NOTE — Progress Notes (Signed)
Pt being discharged back to group home.DC instructions given and patient verbalized understanding.

## 2014-08-25 NOTE — Progress Notes (Signed)
PT Cancellation Note  Patient Details Name: Stacey Velazquez MRN: [id number] DOB: 1945/07/21   Cancelled Treatment:    Reason Eval/Treat Not Completed: Patient declined, no reason specified;Other (comment).  Pt politely refuses to work w/ therapy as she is tired and would like to rest until her ride arrives, is anticipating d/c to group home today.  PT will continue to follow acutely.  Thank you for this order.  Joslyn Hy PT, DPT [phone] Pager: [phone] 08/25/2014, 3:07 PM

## 2014-08-25 NOTE — Discharge Summary (Signed)
Physician Discharge Summary  Stacey Velazquez [id number] DOB: 06/28/1945 DOA: 08/21/2014  PCP: Lorayne Marek, MD  Admit date: 08/21/2014 Discharge date: 08/25/2014  Time spent: 65 minutes  Recommendations for Outpatient Follow-up:  1. Patient be discharged back to the group home. Follow-up with Lorayne Marek, MD in 1 week. On follow-up patient in needs a CBC done to follow-up on her leukopenia, and thrombocytopenia. Patient Imuran was discontinued on discharge and resumption of Imuran will be deferred to PCP pending follow-up labs. 2. On follow-up with PCP patient may benefit from a referral to neurology for management of her seizures. Patient's Keppra dose was increased to 500 mg twice daily during the hospitalization per Neurology recommendations.  Discharge Diagnoses:  Principal Problem:   Acute encephalopathy Active Problems:   Syncope   Seizures   Bipolar 1 disorder   Arterial hypotension   Hypokalemia   Azathioprine-induced leukopenia   Thrombocytopenia due to drugs   Hepatitis, autoimmune   Discharge Condition: Stable and improved  Diet recommendation: Regular  Filed Weights   08/23/14 0500 08/23/14 2028 08/25/14 0510  Weight: 97.3 kg (214 lb 8.1 oz) 94.212 kg (207 lb 11.2 oz) 95.255 kg (210 lb)    History of present illness:  Per Dr Nelda Marseille This is a 69 year old female who currently resides at a group home (but was planning on getting an apartment). Was at an adult daycare when she became unresponsive. Apparently vomited while at the daycare and again in route to the hospital. It was unclear if the vomiting occurred prior to her AMS or after. EMS was called. On arrival to the ER she was hypotensive w/ her SBP in 70s, and lethargic with a GCS of 8. She was intubated for inability to protect airway. A CT of head was obtained and was negative for acute process. Cultures were obtained, empiric antibiotics were started and IV crystalloid bolus initiated. Her BP responded well  to volume. PCCM Was asked to admit    Hospital Course:  #1 acute encephalopathy/syncope Patient had presented with a syncopal episode and noted to be hypotensive on admission, lethargic and inability to protect airway. Patient was subsequently intubated and admitted to the critical care service. CT of the head which was done was negative. EEG which was done was negative. Patient's pacemaker was interrogated and was normal. Unable to perform MRI of the head secondary to pacemaker. 2-D echo was done which normal EF with no warm wash and abnormalities grade 1 diastolic dysfunction. No significant valvular stenosis. Carotid Dopplers were done with no significant ICA stenosis. Patient was seen in consultation by neurology who recommended increasing patient's Keppra dose to 500 mg twice daily. Patient did not have any further episodes of syncope and patient be discharged in stable and improved condition. Follow.  #2 ineffective airway protection Secondary to problem #1. Patient was initially intubated and admitted under the critical care service. Patient was subsequently extubated and was protecting her airway. Resolved.  #3 history of seizure disorder See problem #1. Due to patient's low dose Keppra a prolonged period of altered mental status neurology was consulted neurology felt could not fully exclude possibility of recurrent seizure. Keppra dose was increased to 500 mg twice daily. EEG which was done was negative. Outpatient follow-up with neurologist.  #4 hypokalemia Repleted.  #5 nausea vomiting/history of autoimmune hepatitis Patient with no further emesis. Imuran was held. Due to patient's leukopenia and thrombocytopenia patient's Imuran will not be resumed on discharge until patient follows up PCP as outpatient  one week post discharge.  #6 thrombocytopenia chronic/chronic leukopenia Patient was noted to have a chronic leukopenia thrombocytopenia. Likely secondary to patient's Imuran.  Patient's Imuran was held throughout the hospitalization and will not be resumed on discharge. Patient will follow-up with PCP as outpatient to determine when Imuran may be resumed. Patient's white count on day of discharge was 1.4 and a platelet count of 81.  #7 hypothyroidism TSH is 0.349. Continued on home dose Synthroid.   #8 bipolar disorder Continued on home regimen of risperdal   Procedures: OETT 7/27>>> 7/28 2-D echo 08/24/2014 Carotid Doppler 08/25/2014 CT head 08/21/2014 Chest x-ray 08/22/2014 EEG 08/22/2014    Consultations:    Discharge Exam: Filed Vitals:   08/25/14 1444  BP: 111/68  Pulse: 84  Temp: 98.8 F (37.1 C)  Resp: 20    General: NAD Cardiovascular: RRR Respiratory: CTAB  Discharge Instructions   Discharge Instructions    Diet general    Complete by:  As directed      Discharge instructions    Complete by:  As directed   [place] PCP IN 1 WEEK.     Increase activity slowly    Complete by:  As directed           Current Discharge Medication List    START taking these medications   Details  benzonatate (TESSALON) 200 MG capsule Take 1 capsule (200 mg total) by mouth 3 (three) times daily. Take for 4 days then stop. Qty: 20 capsule, Refills: 0      CONTINUE these medications which have CHANGED   Details  levETIRAcetam (KEPPRA) 500 MG tablet Take 1 tablet (500 mg total) by mouth 2 (two) times daily. Qty: 62 tablet, Refills: 0   Associated Diagnoses: Convulsions, unspecified convulsion type      CONTINUE these medications which have NOT CHANGED   Details  Cholecalciferol (VITAMIN D) 1000 UNITS capsule Take 1,000 Units by mouth daily.    furosemide (LASIX) 20 MG tablet Take 1 tablet (20 mg total) by mouth daily. Qty: 90 tablet, Refills: 3    ketoprofen (ORUDIS) 75 MG capsule Take 1 capsule (75 mg total) by mouth 3 (three) times daily as needed (for headache). Qty: 30 capsule, Refills: 0     Lactobacillus (ACIDOPHOLUS PO) Take 1 tablet by mouth daily as needed (lactose intolerance).    levothyroxine (SYNTHROID, LEVOTHROID) 100 MCG tablet Take 1 tablet (100 mcg total) by mouth daily before breakfast. Qty: 30 tablet, Refills: 4   Associated Diagnoses: Other specified hypothyroidism    meclizine (ANTIVERT) 12.5 MG tablet Take 1 tablet (12.5 mg total) by mouth 3 (three) times daily as needed for dizziness. Qty: 90 tablet, Refills: 2   Associated Diagnoses: BPPV (benign paroxysmal positional vertigo), unspecified laterality    risperiDONE (RISPERDAL) 3 MG tablet Take 3 mg by mouth at bedtime.      STOP taking these medications     azaTHIOprine (IMURAN) 50 MG tablet        Allergies  Allergen Reactions  . Ambien [Zolpidem Tartrate] Other (See Comments)    Hallucinations   . Amitriptyline Other (See Comments)    Stops heart per pt  . Ampicillin Other (See Comments)    Stops heart per pt   . Anaprox [Naproxen Sodium] Other (See Comments)    Doesn't work per pt  . Benadryl [Diphenhydramine Hcl (Sleep)] Other (See Comments)    Rash  . Cetirizine & Related Other (See Comments)  Swelling   . Cortizone-10 [Hydrocortisone] Other (See Comments)    seizures   . Darvon [Propoxyphene] Other (See Comments)    Increased heart rate per pt   . Diazepam Other (See Comments)    Stops heart per pt  . Diflunisal Swelling and Other (See Comments)    Stops heart per pt (Dolobid)  . Duloxetine Other (See Comments)    Reaction to Cymbalta - pt doesn't remember what the reaction was  . Flexeril [Cyclobenzaprine] Other (See Comments)    Seizures   . Lactose Intolerance (Gi) Swelling and Other (See Comments)    cramping  . Lidocaine Other (See Comments)    Seizures  . Meperidine And Related Other (See Comments)    Stops heart rate (reaction to Demerol)  . Metanx [L-Methylfolate-Algae-B12-B6] Other (See Comments)    Pt does not remember reaction  . Metoclopramide Other (See  Comments)    Pt does not remember reaction  . Morphine And Related Other (See Comments)    Stops heart per pt   . Nuprin [Ibuprofen] Other (See Comments)    Caused headache  . Oxycodone Other (See Comments)    Hallucinations   . Penicillins Other (See Comments)    Stops heart per pt   . Propoxyphene Other (See Comments)    Slowed heart rate per pt (reaction to Darvocet)  . Ranitidine Other (See Comments)    Pt does not remember reaction  . Vicodin [Hydrocodone-Acetaminophen] Other (See Comments)    Seizures   . Tylenol [Acetaminophen] Rash   Follow-up Information    Follow up with Lorayne Marek, MD. Schedule an appointment as soon as possible for a visit in 1 week.   Specialty:  Internal Medicine   Contact information:   [place] [address] [phone]        The results of significant diagnostics from this hospitalization (including imaging, microbiology, ancillary and laboratory) are listed below for reference.    Significant Diagnostic Studies: Ct Head Wo Contrast  08/21/2014   CLINICAL DATA:  Unresponsive, hypotensive, history of seizures, bipolar disorder  EXAM: CT HEAD WITHOUT CONTRAST  TECHNIQUE: Contiguous axial images were obtained from the base of the skull through the vertex without intravenous contrast.  COMPARISON:  10/31/2013  FINDINGS: There is no evidence of mass effect, midline shift, or extra-axial fluid collections. There is no evidence of a space-occupying lesion or intracranial hemorrhage. There is no evidence of a cortical-based area of acute infarction. There is generalized cerebral atrophy. There is periventricular white matter low attenuation likely secondary to microangiopathy.  The ventricles and sulci are appropriate for the patient's age. The basal cisterns are patent.  Visualized portions of the orbits are unremarkable. The visualized portions of the paranasal sinuses and mastoid air cells are unremarkable.  The osseous  structures are unremarkable.  IMPRESSION: 1. No acute intracranial pathology. 2. Chronic microvascular disease and cerebral atrophy.   Electronically Signed   By: Kathreen Devoid   On: 08/21/2014 12:30   Dg Chest Port 1 View  08/22/2014   CLINICAL DATA:  Hypoxia  EXAM: PORTABLE CHEST - 1 VIEW  COMPARISON:  August 21, 2014  FINDINGS: Endotracheal tube tip is 1.8 cm above the carina. Nasogastric tube tip and side port are below the diaphragm. Pacemaker leads are attached to the right atrium and right ventricle. No pneumothorax. There is patchy atelectasis in the left base. Lungs elsewhere clear. Heart size and pulmonary vascularity are normal. No adenopathy. No bone lesions.  IMPRESSION: Tube positions as described without pneumothorax. Left base atelectasis. Lungs elsewhere clear. Heart size within normal limits.   Electronically Signed   By: Lowella Grip III M.D.   On: 08/22/2014 07:58   Dg Chest Portable 1 View  08/21/2014   CLINICAL DATA:  Intubation  EXAM: PORTABLE CHEST - 1 VIEW  COMPARISON:  None.  FINDINGS: Lungs are under aerated with bibasilar atelectasis. Endotracheal tube in place with its tip 2.0 cm from the carina. NG tube in place. Tip is beyond the gastroesophageal junction. Upper lungs clear. Normal heart size.  IMPRESSION: Endotracheal and NG tubes as described.  Bibasilar atelectasis.   Electronically Signed   By: Marybelle Killings M.D.   On: 08/21/2014 11:42   Dg Abd Portable 1v  08/21/2014   CLINICAL DATA:  Vomiting.  EXAM: PORTABLE ABDOMEN - 1 VIEW  COMPARISON:  None.  FINDINGS: The bowel gas pattern is normal. Nasogastric tube tip is seen in expected position of distal stomach. Surgical clips and phleboliths are noted in the pelvis.  IMPRESSION: No evidence of bowel obstruction or ileus.   Electronically Signed   By: Marijo Conception, M.D.   On: 08/21/2014 14:49    Microbiology: Recent Results (from the past 240 hour(s))  Blood culture (routine x 2)     Status: None (Preliminary result)    Collection Time: 08/21/14 11:00 AM  Result Value Ref Range Status   Specimen Description BLOOD LEFT ANTECUBITAL  Final   Special Requests BOTTLES DRAWN AEROBIC AND ANAEROBIC 5CC  Final   Culture NO GROWTH 4 DAYS  Final   Report Status PENDING  Incomplete  Blood culture (routine x 2)     Status: None (Preliminary result)   Collection Time: 08/21/14 11:42 AM  Result Value Ref Range Status   Specimen Description BLOOD RIGHT ANTECUBITAL  Final   Special Requests BOTTLES DRAWN AEROBIC AND ANAEROBIC 5CC  Final   Culture NO GROWTH 4 DAYS  Final   Report Status PENDING  Incomplete  Urine culture     Status: None   Collection Time: 08/21/14 11:50 AM  Result Value Ref Range Status   Specimen Description URINE, CATHETERIZED  Final   Special Requests Normal  Final   Culture MULTIPLE SPECIES PRESENT, SUGGEST RECOLLECTION  Final   Report Status 08/23/2014 FINAL  Final  MRSA PCR Screening     Status: None   Collection Time: 08/21/14  6:45 PM  Result Value Ref Range Status   MRSA by PCR NEGATIVE NEGATIVE Final    Comment:        The GeneXpert MRSA Assay (FDA approved for NASAL specimens only), is one component of a comprehensive MRSA colonization surveillance program. It is not intended to diagnose MRSA infection nor to guide or monitor treatment for MRSA infections.      Labs: Basic Metabolic Panel:  Recent Labs Lab 08/21/14 1100 08/21/14 1650 08/22/14 0205 08/23/14 0232 08/24/14 0513 08/25/14 0605  NA 135  --  136 137 137 136  K 3.9  --  3.0* 3.3* 3.6 3.4*  CL 106  --  107 110 111 108  CO2 21*  --  23 22 23 24   GLUCOSE 107*  --  121* 105* 103* 99  BUN 8  --  6 <5* <5* 9  CREATININE 0.79  --  0.67 0.63 0.65 0.64  CALCIUM 8.2*  --  7.8* 7.7* 7.9* 8.2*  MG  --  1.7 1.8 1.8  --   --   PHOS  --  2.7 2.0* 3.1  --   --    Liver Function Tests:  Recent Labs Lab 08/21/14 1100  AST 34  ALT 16  ALKPHOS 61  BILITOT 0.6  PROT 7.3  ALBUMIN 2.8*    Recent Labs Lab  08/21/14 1650  LIPASE 20*  AMYLASE 452*    Recent Labs Lab 08/22/14 1006  AMMONIA 28   CBC:  Recent Labs Lab 08/21/14 1100 08/21/14 1650 08/22/14 0205 08/23/14 0232 08/25/14 0605  WBC 2.0*  --  2.7* 1.8* 1.4*  NEUTROABS  --   --   --  0.9*  --   HGB 13.6  --  12.8 12.1 12.2  HCT 39.5  --  37.1 34.1* 34.5*  MCV 93.4  --  92.8 93.2 91.3  PLT 91* 69* 79* 106* 81*   Cardiac Enzymes: No results for input(s): CKTOTAL, CKMB, CKMBINDEX, TROPONINI in the last 168 hours. BNP: BNP (last 3 results) No results for input(s): BNP in the last 8760 hours.  ProBNP (last 3 results) No results for input(s): PROBNP in the last 8760 hours.  CBG:  Recent Labs Lab 08/24/14 2031 08/25/14 0034 08/25/14 0416 08/25/14 0830 08/25/14 1224  GLUCAP 109* 109* 109* 116* 95       Signed:  Donaciano Range MD [facility] 08/25/2014, 2:45 PM

## 2014-08-26 LAB — CULTURE, BLOOD (ROUTINE X 2)
Culture: NO GROWTH
Culture: NO GROWTH

## 2014-09-09 ENCOUNTER — Telehealth: Payer: Self-pay | Admitting: Family Medicine

## 2014-09-09 ENCOUNTER — Ambulatory Visit: Payer: Medicare Other | Attending: Family Medicine | Admitting: Family Medicine

## 2014-09-09 ENCOUNTER — Encounter: Payer: Self-pay | Admitting: Family Medicine

## 2014-09-09 VITALS — BP 131/88 | HR 102 | Temp 98.1°F | Resp 16 | Ht 69.0 in | Wt 211.0 lb

## 2014-09-09 DIAGNOSIS — F319 Bipolar disorder, unspecified: Secondary | ICD-10-CM

## 2014-09-09 DIAGNOSIS — D6959 Other secondary thrombocytopenia: Secondary | ICD-10-CM | POA: Diagnosis not present

## 2014-09-09 DIAGNOSIS — R569 Unspecified convulsions: Secondary | ICD-10-CM | POA: Diagnosis not present

## 2014-09-09 DIAGNOSIS — E039 Hypothyroidism, unspecified: Secondary | ICD-10-CM | POA: Diagnosis not present

## 2014-09-09 DIAGNOSIS — T50905A Adverse effect of unspecified drugs, medicaments and biological substances, initial encounter: Secondary | ICD-10-CM

## 2014-09-09 DIAGNOSIS — E876 Hypokalemia: Secondary | ICD-10-CM | POA: Diagnosis not present

## 2014-09-09 DIAGNOSIS — Z1159 Encounter for screening for other viral diseases: Secondary | ICD-10-CM | POA: Insufficient documentation

## 2014-09-09 DIAGNOSIS — K754 Autoimmune hepatitis: Secondary | ICD-10-CM | POA: Diagnosis not present

## 2014-09-09 LAB — BASIC METABOLIC PANEL
BUN: 7 mg/dL (ref 7–25)
CO2: 24 mmol/L (ref 20–31)
Calcium: 8.7 mg/dL (ref 8.6–10.4)
Chloride: 103 mmol/L (ref 98–110)
Creat: 0.8 mg/dL (ref 0.60–0.93)
Glucose, Bld: 87 mg/dL (ref 65–99)
Potassium: 4.2 mmol/L (ref 3.5–5.3)
Sodium: 136 mmol/L (ref 135–146)

## 2014-09-09 LAB — CBC
HCT: 41 % (ref 36.0–46.0)
Hemoglobin: 14.1 g/dL (ref 12.0–15.0)
MCH: 31.9 pg (ref 26.0–34.0)
MCHC: 34.4 g/dL (ref 30.0–36.0)
MCV: 92.8 fL (ref 78.0–100.0)
MPV: 9.4 fL (ref 8.6–12.4)
Platelets: 120 10*3/uL — ABNORMAL LOW (ref 150–400)
RBC: 4.42 MIL/uL (ref 3.87–5.11)
RDW: 15.6 % — ABNORMAL HIGH (ref 11.5–15.5)
WBC: 2.4 10*3/uL — ABNORMAL LOW (ref 4.0–10.5)

## 2014-09-09 MED ORDER — LEVETIRACETAM 500 MG PO TABS: 500.0000 mg | ORAL_TABLET | Freq: Two times a day (BID) | ORAL | Status: DC

## 2014-09-09 MED ORDER — POTASSIUM CHLORIDE CRYS ER 20 MEQ PO TBCR: 20.0000 meq | EXTENDED_RELEASE_TABLET | Freq: Every day | ORAL | Status: DC

## 2014-09-09 NOTE — Progress Notes (Signed)
   Subjective:    Patient ID: Stacey Velazquez, female    DOB: 11/15/1945, 69 y.o.   MRN: [id number] CC: f/u hospitalization hypotension and syncope  HPI 69 yo F with bipolar followed at [facility], autoimmune hepatitis, seizures  1. Autoimmune hepatitis: treated for many years at [facility] with imuran. Hepatologist at [facility]. She has not f/u since 2014 due to lack of transportation. She frequently had leukopenia and thrombocytopenia while on imuran and her dose would be lowered to 50 or 75 mg daily from 100 mg daily. She denies GI upset, bruises and bleeding.   2. Seizures: she is on increased dose of keppra. No seizures or seizure like activity since hospitalization. She does not have a neurologist. She has an intermittent resting tremor.   3. Hypokalemia: mild low K+. Now on lasix prescribed by her cardiologist. Not taking supplemental potassium.  4. Low TSH: during recent hospitalization. Taking synthroid.    Social History  Substance Use Topics  . Smoking status: Never Smoker   . Smokeless tobacco: Not on file  . Alcohol Use: No   Review of Systems  Constitutional: Negative for fever and chills.  Eyes: Negative for visual disturbance.  Respiratory: Negative for shortness of breath.   Cardiovascular: Negative for chest pain.  Gastrointestinal: Negative for abdominal pain and blood in stool.  Musculoskeletal: Negative for back pain and arthralgias.  Skin: Negative for rash.  Allergic/Immunologic: Negative for immunocompromised state.  Neurological: Positive for tremors. Negative for seizures.  Hematological: Negative for adenopathy. Does not bruise/bleed easily.  Psychiatric/Behavioral: Negative for suicidal ideas and dysphoric mood.       Objective:   Physical Exam  Constitutional: She is oriented to person, place, and time. She appears well-developed and well-nourished. No distress.  HENT:  Head: Normocephalic and atraumatic.  Cardiovascular: Normal rate, regular rhythm, normal heart  sounds and intact distal pulses.   Pulmonary/Chest: Effort normal and breath sounds normal.  Musculoskeletal: She exhibits no edema.  Neurological: She is alert and oriented to person, place, and time. She displays tremor (intermittent resting tremor in hands ).  Skin: Skin is warm and dry. No rash noted.  Psychiatric: Her speech is normal and behavior is normal. Judgment and thought content normal. Her affect is blunt. Cognition and memory are normal.  BP 131/88 mmHg  Pulse 102  Temp(Src) 98.1 F (36.7 C)  Resp 16  Ht 5\' 9"  (1.753 m)  Wt 211 lb (95.709 kg)  BMI 31.15 kg/m2  SpO2 98%       Assessment & Plan:

## 2014-09-09 NOTE — Assessment & Plan Note (Addendum)
Checking TSH to f/u low TSH in hospital Reviewed chart. No elevated TSH in [facility]. Unsure if patient is truly hypothyroid.

## 2014-09-09 NOTE — Patient Instructions (Addendum)
Stacey Velazquez,  Thank you for coming in today. It was a pleasure meeting you. I look forward to being your primary doctor.  1. Imuran induced low platelets and low white blood cell count Continue to hold off on talking imuran CBC today Referral to GI/hepatology   2. Seizures: Continue current dose of keppra Neurology referral  3. Low potassium on lasix: Checking potassium Add KDUR 20 mEq once daily   Checking TSH, potassium and screening Hep C  F/u in 4 weeks with RN for flu shot F/u with me in 6-8 weeks for pap smear   Dr. Adrian Blackwater

## 2014-09-09 NOTE — Progress Notes (Signed)
Patient here for follow up from the hospital Was admitted with hypotension syncope and seizure type activity Patient states her keppra was increased to 500mg  BID

## 2014-09-09 NOTE — Assessment & Plan Note (Addendum)
Imuran induced low platelets and low white blood cell count Continue to hold off on talking imuran CBC today Referral to GI/hepatology   WBC has improved to 2.4 from 1.4, platelets have improved to 120 from 6.9 off imuran. Continue to hold imuran for 2 more weeks with plan to restart in 2 weeks following repeat CBC and CMP

## 2014-09-09 NOTE — Assessment & Plan Note (Signed)
Seizures: Continue current dose of keppra Neurology referral

## 2014-09-09 NOTE — Telephone Encounter (Signed)
Tried contacting patient to let them know that their [employer] paperwork is ready for pick up and I could not leave a vm.

## 2014-09-09 NOTE — Telephone Encounter (Signed)
Left patients paperwork in the brown accordion at the front desk.

## 2014-09-09 NOTE — Assessment & Plan Note (Signed)
Screening Hep C drawn

## 2014-09-10 LAB — TSH: TSH: 1.975 u[IU]/mL (ref 0.350–4.500)

## 2014-09-10 NOTE — Assessment & Plan Note (Signed)
Repeat potassium checked, hypokalemia resolved

## 2014-09-10 NOTE — Addendum Note (Signed)
Addended by: Boykin Nearing on: 09/10/2014 08:58 AM   Modules accepted: Orders

## 2014-09-11 ENCOUNTER — Telehealth: Payer: Self-pay | Admitting: *Deleted

## 2014-09-11 NOTE — Telephone Encounter (Signed)
Pt aware of lab results. Advised to hold Imuran for 2 weeks. Restart in 2 weeks. Transfer to front office to schedule lab appointment in two weeks. Pt verbalize understanding. Date of birth was verified

## 2014-09-11 NOTE — Telephone Encounter (Signed)
-----   Message from Boykin Nearing, MD sent at 09/10/2014  8:56 AM EDT ----- TSH normal, potassium normal WBC has improved to 2.4 from 1.4, platelets have improved to 120 from 6.9 off imuran. Continue to hold imuran for 2 more weeks with plan to restart in 2 weeks following repeat CBC and CMP

## 2014-09-25 ENCOUNTER — Ambulatory Visit: Payer: Medicare Other | Attending: Family Medicine

## 2014-09-25 DIAGNOSIS — K754 Autoimmune hepatitis: Secondary | ICD-10-CM

## 2014-09-25 DIAGNOSIS — T50905A Adverse effect of unspecified drugs, medicaments and biological substances, initial encounter: Secondary | ICD-10-CM

## 2014-09-25 DIAGNOSIS — D6959 Other secondary thrombocytopenia: Secondary | ICD-10-CM

## 2014-09-25 LAB — COMPLETE METABOLIC PANEL WITH GFR
ALT: 39 U/L — ABNORMAL HIGH (ref 6–29)
AST: 49 U/L — ABNORMAL HIGH (ref 10–35)
Albumin: 3.6 g/dL (ref 3.6–5.1)
Alkaline Phosphatase: 99 U/L (ref 33–130)
BUN: 10 mg/dL (ref 7–25)
CO2: 25 mmol/L (ref 20–31)
Calcium: 9 mg/dL (ref 8.6–10.4)
Chloride: 102 mmol/L (ref 98–110)
Creat: 0.83 mg/dL (ref 0.60–0.93)
GFR, Est African American: 83 mL/min (ref >=60)
GFR, Est Non African American: 72 mL/min (ref >=60)
Glucose, Bld: 86 mg/dL (ref 65–99)
Potassium: 4.2 mmol/L (ref 3.5–5.3)
Sodium: 135 mmol/L (ref 135–146)
Total Bilirubin: 0.5 mg/dL (ref 0.2–1.2)
Total Protein: 8 g/dL (ref 6.1–8.1)

## 2014-09-26 ENCOUNTER — Other Ambulatory Visit: Payer: Self-pay | Admitting: Family Medicine

## 2014-09-26 DIAGNOSIS — K754 Autoimmune hepatitis: Secondary | ICD-10-CM

## 2014-09-26 LAB — CBC
HCT: 41.2 % (ref 36.0–46.0)
Hemoglobin: 14.1 g/dL (ref 12.0–15.0)
MCH: 31.5 pg (ref 26.0–34.0)
MCHC: 34.2 g/dL (ref 30.0–36.0)
MCV: 92 fL (ref 78.0–100.0)
MPV: 9.3 fL (ref 8.6–12.4)
Platelets: 95 10*3/uL — ABNORMAL LOW (ref 150–400)
RBC: 4.48 MIL/uL (ref 3.87–5.11)
RDW: 15 % (ref 11.5–15.5)
WBC: 2.4 10*3/uL — ABNORMAL LOW (ref 4.0–10.5)

## 2014-09-26 MED ORDER — AZATHIOPRINE 50 MG PO TABS: 50.0000 mg | ORAL_TABLET | Freq: Two times a day (BID) | ORAL | Status: DC

## 2014-09-26 NOTE — Assessment & Plan Note (Addendum)
Stable WBC Platelets slightly down AST/ALT slightly elevated Restart imuran at 50 mg BID with further adjustment at GI if needed

## 2014-09-27 ENCOUNTER — Telehealth: Payer: Self-pay | Admitting: *Deleted

## 2014-09-27 NOTE — Telephone Encounter (Signed)
Date of birth of verified by Pt  Advised to Restart Imuran 50 bid  Lab results given  Pt verbalized understanding

## 2014-09-27 NOTE — Telephone Encounter (Signed)
-----   Message from Boykin Nearing, MD sent at 09/26/2014 10:01 AM EDT ----- Stable WBC Platelets slightly down AST/ALT slightly elevated Restart imuran at 50 mg BID with further adjustment at GI if needed

## 2014-09-27 NOTE — Telephone Encounter (Signed)
-----   Message from Boykin Nearing, MD sent at 09/26/2014  9:58 AM EDT ----- Stable WBC Platelets slightly down AST/ALT slightly elevated Restart imuran at 25 mg BID with further adjustment at GI if needed

## 2014-10-02 ENCOUNTER — Ambulatory Visit (INDEPENDENT_AMBULATORY_CARE_PROVIDER_SITE_OTHER): Payer: Medicare Other | Admitting: *Deleted

## 2014-10-02 DIAGNOSIS — I495 Sick sinus syndrome: Secondary | ICD-10-CM | POA: Diagnosis not present

## 2014-10-03 NOTE — Progress Notes (Signed)
Remote pacemaker transmission.   

## 2014-10-14 ENCOUNTER — Encounter: Payer: Self-pay | Admitting: Diagnostic Neuroimaging

## 2014-10-14 ENCOUNTER — Ambulatory Visit (INDEPENDENT_AMBULATORY_CARE_PROVIDER_SITE_OTHER): Payer: Medicare Other | Admitting: Diagnostic Neuroimaging

## 2014-10-14 VITALS — BP 110/86 | HR 88 | Ht 69.0 in | Wt 204.8 lb

## 2014-10-14 DIAGNOSIS — G2119 Other drug induced secondary parkinsonism: Secondary | ICD-10-CM | POA: Insufficient documentation

## 2014-10-14 DIAGNOSIS — G40909 Epilepsy, unspecified, not intractable, without status epilepticus: Secondary | ICD-10-CM | POA: Diagnosis not present

## 2014-10-14 DIAGNOSIS — R569 Unspecified convulsions: Secondary | ICD-10-CM

## 2014-10-14 MED ORDER — LEVETIRACETAM 500 MG PO TABS: 500.0000 mg | ORAL_TABLET | Freq: Two times a day (BID) | ORAL | Status: DC

## 2014-10-14 NOTE — Patient Instructions (Signed)
Continue levetiracetam.  Discuss tremors and risperdol dosing with your psychiatrist.

## 2014-10-14 NOTE — Progress Notes (Signed)
[facility]  PATIENT: Stacey Velazquez DOB: 1945/06/05  REFERRING CLINICIAN: Funches HISTORY FROM: patient  REASON FOR VISIT: new consult    HISTORICAL  CHIEF COMPLAINT:  Chief Complaint  Patient presents with  . Seizures    rm 7, New Patient, hospital FU    HISTORY OF PRESENT ILLNESS:   69 year old right-handed female here for evaluation of seizure. Patient has history of bipolar disorder, autoimmune hepatitis, benign positional vertigo, coronary artery disease, pacemaker, anxiety.  Patient has history of seizures since 2000 when she developed episodes with nausea, heavy sensation, loss of consciousness. No shaking. In 2005 she was diagnosed with seizure disorder. In 2006 she was started on levetiracetam 250 mg twice a day. Patient continues abdomen episodes of seizure. Last seizures were around 2013 and 2014.  July 2016 patient was admitted to the hospital for episode of nausea, vomiting, syncope. Cardiac workup was done. EEG was unremarkable. Patient was about a by neurology who recommended increasing Keppra dosing. Now she is on 5 mg twice a day.  Since discharge patient is doing well. No further episodes of seizure or syncope.  Separately patient has noticed some resting tremor in hands and mouth for past 5-6 months. Patient is on Risperdal medication for several years.   REVIEW OF SYSTEMS: Full 14 system review of systems performed and notable only for weight gain increased thirst aching muscles moles headache numbness weakness seizure tremor.  ALLERGIES: Allergies  Allergen Reactions  . Ambien [Zolpidem Tartrate] Other (See Comments)    Hallucinations   . Amitriptyline Other (See Comments)    Stops heart per pt  . Ampicillin Other (See Comments)    Stops heart per pt   . Anaprox [Naproxen Sodium] Other (See Comments)    Doesn't work per pt  . Benadryl [Diphenhydramine Hcl (Sleep)] Other (See Comments)    Rash  . Cetirizine & Related Other  (See Comments)    Swelling   . Cortizone-10 [Hydrocortisone] Other (See Comments)    seizures   . Darvon [Propoxyphene] Other (See Comments)    Increased heart rate per pt   . Diazepam Other (See Comments)    Stops heart per pt  . Diflunisal Swelling and Other (See Comments)    Stops heart per pt (Dolobid)  . Duloxetine Other (See Comments)    Reaction to Cymbalta - pt doesn't remember what the reaction was  . Flexeril [Cyclobenzaprine] Other (See Comments)    Seizures   . Lactose Intolerance (Gi) Swelling and Other (See Comments)    cramping  . Lidocaine Other (See Comments)    Seizures  . Meperidine And Related Other (See Comments)    Stops heart rate (reaction to Demerol)  . Metanx [L-Methylfolate-Algae-B12-B6] Other (See Comments)    Pt does not remember reaction  . Metoclopramide Other (See Comments)    Pt does not remember reaction  . Morphine And Related Other (See Comments)    Stops heart per pt   . Nuprin [Ibuprofen] Other (See Comments)    Caused headache  . Oxycodone Other (See Comments)    Hallucinations   . Penicillins Other (See Comments)    Stops heart per pt   . Propoxyphene Other (See Comments)    Slowed heart rate per pt (reaction to Darvocet)  . Ranitidine Other (See Comments)    Pt does not remember reaction  . Vicodin [Hydrocodone-Acetaminophen] Other (See Comments)    Seizures   . Tylenol [Acetaminophen] Rash    HOME MEDICATIONS: Outpatient Prescriptions  Prior to Visit  Medication Sig Dispense Refill  . azaTHIOprine (IMURAN) 50 MG tablet Take 1 tablet (50 mg total) by mouth 2 (two) times daily. 60 tablet 2  . Cholecalciferol (VITAMIN D) 1000 UNITS capsule Take 1,000 Units by mouth daily.    . furosemide (LASIX) 20 MG tablet Take 1 tablet (20 mg total) by mouth daily. 90 tablet 3  . ketoprofen (ORUDIS) 75 MG capsule Take 1 capsule (75 mg total) by mouth 3 (three) times daily as needed (for headache). (Patient taking differently: Take 75 mg  by mouth 3 (three) times daily as needed (headache). ) 30 capsule 0  . Lactobacillus (ACIDOPHOLUS PO) Take 1 tablet by mouth daily as needed (lactose intolerance).    Marland Kitchen levothyroxine (SYNTHROID, LEVOTHROID) 100 MCG tablet Take 1 tablet (100 mcg total) by mouth daily before breakfast. 30 tablet 4  . meclizine (ANTIVERT) 12.5 MG tablet Take 1 tablet (12.5 mg total) by mouth 3 (three) times daily as needed for dizziness. 90 tablet 2  . potassium chloride SA (K-DUR,KLOR-CON) 20 MEQ tablet Take 1 tablet (20 mEq total) by mouth daily. 30 tablet 3  . risperiDONE (RISPERDAL) 3 MG tablet Take 3 mg by mouth at bedtime.    . levETIRAcetam (KEPPRA) 500 MG tablet Take 1 tablet (500 mg total) by mouth 2 (two) times daily. 62 tablet 0  . benzonatate (TESSALON) 200 MG capsule Take 1 capsule (200 mg total) by mouth 3 (three) times daily. Take for 4 days then stop. 20 capsule 0   No facility-administered medications prior to visit.    PAST MEDICAL HISTORY: Past Medical History  Diagnosis Date  . Bipolar 1 disorder   . Sinus arrest   . Pacemaker Medtronic     MRI compatible  . Hypotension   . Autoimmune hepatitis   . GERD (gastroesophageal reflux disease)   . Hypothyroidism   . Hepatitis, autoimmune 08/25/2014  . Glaucoma 2003   . Seizures     last sz 08/21/14  . Vertigo     bvvp  . Anxiety   . Heart disease     PAST SURGICAL HISTORY: Past Surgical History  Procedure Laterality Date  . Pacemaker insertion  2014  . Knee surgery Left 1985  . Partial hysterectomy  1979  . Cataract extraction Left 2008    FAMILY HISTORY: Family History  Problem Relation Age of Onset  . Heart attack Father   . Heart disease Paternal Uncle   . Heart disease Paternal Grandmother   . Diabetes Paternal Grandmother   . Heart disease Paternal Grandfather   . Cancer Mother     BREAST  . Diabetes Sister   . Diabetes Brother   . Cancer Maternal Grandmother   . CVA Maternal Grandmother   . Cancer Maternal  Grandfather   . CVA Maternal Grandfather   . Diabetes Brother     SOCIAL HISTORY:  Social History   Social History  . Marital Status: Married    Spouse Name: N/A  . Number of Children: N/A  . Years of Education: N/A   Occupational History  . Not on file.   Social History Main Topics  . Smoking status: Former Smoker    Quit date: 03/02/2003  . Smokeless tobacco: Not on file  . Alcohol Use: No  . Drug Use: No  . Sexual Activity: Not on file   Other Topics Concern  . Not on file   Social History Narrative   Lives alone, "have friends and relatives that  come and help me"   caffeine use- coffee -1 cup daily     PHYSICAL EXAM  GENERAL EXAM/CONSTITUTIONAL: Vitals:  Filed Vitals:   10/14/14 1041  BP: 110/86  Pulse: 88  Height: 5\' 9"  (1.753 m)  Weight: 204 lb 12.8 oz (92.897 kg)     Body mass index is 30.23 kg/(m^2).  Visual Acuity Screening   Right eye Left eye Both eyes  Without correction:     With correction: 20/40 20/30      Patient is in no distress; well developed, nourished and groomed; neck is supple  CARDIOVASCULAR:  Examination of carotid arteries is normal; no carotid bruits  Regular rate and rhythm, no murmurs  Examination of peripheral vascular system by observation and palpation is normal  EYES:  Ophthalmoscopic exam of optic discs and posterior segments is normal; no papilledema or hemorrhages  MUSCULOSKELETAL:  Gait, strength, tone, movements noted in Neurologic exam below  NEUROLOGIC: MENTAL STATUS:  No flowsheet data found.  awake, alert, oriented to person, place and time  recent and remote memory intact  normal attention and concentration  language fluent, comprehension intact, naming intact,   fund of knowledge appropriate  CRANIAL NERVE:   2nd - no papilledema on fundoscopic exam  2nd, 3rd, 4th, 6th - pupils equal and reactive to light, visual fields full to confrontation, extraocular muscles intact, no  nystagmus  5th - facial sensation symmetric  7th - facial strength --> SLIGHT DECR RIGHT NL FOLD; SLIGHT LOWER RIGHT EYELID THAN LEFT  8th - hearing intact  9th - palate elevates symmetrically, uvula midline  11th - shoulder shrug symmetric  12th - tongue protrusion midline  MASKED FACIES  MILD MOUTH TREMOR  MOTOR:   normal bulk; REST TREMOR IN LEFT > RIGHT HAND; MILD COGWHEELING IN BUE; MOD BRADYKINESIA IN BUE AND BLE; full strength in the BUE, BLE  SENSORY:   normal and symmetric to light touch, temperature, vibration   COORDINATION:   finger-nose-finger, fine finger movements normal  REFLEXES:   deep tendon reflexes TRACE and symmetric  GAIT/STATION:   narrow based gait; DECR ARM SWING, WITH HAND TREMORS    DIAGNOSTIC DATA (LABS, IMAGING, TESTING) - I reviewed patient records, labs, notes, testing and imaging myself where available.  Lab Results  Component Value Date   WBC 2.4* 09/25/2014   HGB 14.1 09/25/2014   HCT 41.2 09/25/2014   MCV 92.0 09/25/2014   PLT 95* 09/25/2014      Component Value Date/Time   NA 135 09/25/2014 1403   K 4.2 09/25/2014 1403   CL 102 09/25/2014 1403   CO2 25 09/25/2014 1403   GLUCOSE 86 09/25/2014 1403   BUN 10 09/25/2014 1403   CREATININE 0.83 09/25/2014 1403   CREATININE 0.64 08/25/2014 0605   CALCIUM 9.0 09/25/2014 1403   PROT 8.0 09/25/2014 1403   ALBUMIN 3.6 09/25/2014 1403   AST 49* 09/25/2014 1403   ALT 39* 09/25/2014 1403   ALKPHOS 99 09/25/2014 1403   BILITOT 0.5 09/25/2014 1403   GFRNONAA 72 09/25/2014 1403   GFRNONAA >60 08/25/2014 0605   GFRAA 83 09/25/2014 1403   GFRAA >60 08/25/2014 0605   No results found for: CHOL, HDL, LDLCALC, LDLDIRECT, TRIG, CHOLHDL No results found for: HGBA1C No results found for: VITAMINB12 Lab Results  Component Value Date   TSH 1.975 09/09/2014     08/21/14 CT head [I reviewed images myself and agree with interpretation. -VRP]  1. No acute intracranial  pathology. 2. Chronic microvascular  disease and cerebral atrophy.  08/22/14 EEG  - This is a normal awake electroencephalogram. No epileptiform activity is noted.  - Comment: An EEG with the patient sleep deprived to elicit drowse and light sleep may be desirable to further elicit a possible seizure disorder.      ASSESSMENT AND PLAN  69 y.o. year old female here with seizure disorder since year 2000. Also with recent episode of syncope which may have been a seizure as well. Agree with increased dose of levetiracetam 500 mg twice a day. Also noted today is drug-induced parkinsonism symptoms, likely related to restart all.   Dx:  Seizure disorder  Drug-induced Parkinson's disease  Convulsions, unspecified convulsion type - Plan: levETIRAcetam (KEPPRA) 500 MG tablet    PLAN: - continue LEV 500mg  BID - advised patient to follow up with psychiatry re: risperdal and drug-induced parkinsonism  Meds ordered this encounter  Medications  . levETIRAcetam (KEPPRA) 500 MG tablet    Sig: Take 1 tablet (500 mg total) by mouth 2 (two) times daily.    Dispense:  180 tablet    Refill:  4   Return in about 3 months (around 01/13/2015).    Penni Bombard, MD [id number], [id number] AM Certified in Neurology, Neurophysiology and Neuroimaging  [facility] [address], [place], [place] [address] [phone]

## 2014-10-18 LAB — CUP PACEART REMOTE DEVICE CHECK
Battery Remaining Longevity: 99 mo
Battery Voltage: 3.02 V
Brady Statistic AP VP Percent: 0.01 %
Brady Statistic AP VS Percent: 9.81 %
Brady Statistic AS VP Percent: 0.1 %
Brady Statistic AS VS Percent: 90.08 %
Brady Statistic RA Percent Paced: 9.82 %
Brady Statistic RV Percent Paced: 0.11 %
Date Time Interrogation Session: 20160907144103
Lead Channel Impedance Value: 342 Ohm
Lead Channel Impedance Value: 418 Ohm
Lead Channel Impedance Value: 475 Ohm
Lead Channel Impedance Value: 532 Ohm
Lead Channel Pacing Threshold Amplitude: 0.375 V
Lead Channel Pacing Threshold Amplitude: 0.75 V
Lead Channel Pacing Threshold Pulse Width: 0.4 ms
Lead Channel Pacing Threshold Pulse Width: 0.4 ms
Lead Channel Sensing Intrinsic Amplitude: 1.5 mV
Lead Channel Sensing Intrinsic Amplitude: 1.5 mV
Lead Channel Sensing Intrinsic Amplitude: 5.625 mV
Lead Channel Sensing Intrinsic Amplitude: 5.625 mV
Lead Channel Setting Pacing Amplitude: 2 V
Lead Channel Setting Pacing Amplitude: 2.5 V
Lead Channel Setting Pacing Pulse Width: 0.4 ms
Lead Channel Setting Sensing Sensitivity: 0.6 mV
Zone Setting Detection Interval: 350 ms
Zone Setting Detection Interval: 400 ms

## 2014-10-21 ENCOUNTER — Ambulatory Visit: Payer: Medicare Other

## 2014-10-29 ENCOUNTER — Ambulatory Visit: Payer: Medicare Other | Attending: Family Medicine

## 2014-10-29 DIAGNOSIS — Z23 Encounter for immunization: Secondary | ICD-10-CM

## 2014-10-29 DIAGNOSIS — Z0000 Encounter for general adult medical examination without abnormal findings: Secondary | ICD-10-CM

## 2014-11-04 DIAGNOSIS — F319 Bipolar disorder, unspecified: Secondary | ICD-10-CM | POA: Diagnosis not present

## 2014-11-07 ENCOUNTER — Encounter: Payer: Self-pay | Admitting: Cardiology

## 2014-11-14 ENCOUNTER — Encounter: Payer: Self-pay | Admitting: Internal Medicine

## 2014-11-18 ENCOUNTER — Telehealth: Payer: Self-pay | Admitting: Lab

## 2014-11-18 NOTE — Telephone Encounter (Signed)
LM for PCP's office to contact me Alinda Sierras) at [facility]- regarding pt's diagnosis-No supporting tests included with Hep C referral.  Also, Dr. Linus Salmons stated (prior auths recd 08/26 & 9/1) -he doesn't treat Hepatitis Autoimmune which was the only diagnosis on the previous 2 referrals. On the present referral Hep C diagnosis is included.

## 2014-11-21 NOTE — Telephone Encounter (Signed)
I did not refer patient to Hep C/ID clinic. I referred patient to heme/oncology for autoimmune hepatitis and medication induced thrombocytopenia.  Please cancel Hep C clinic referral Please schedule heme/oncology

## 2014-11-21 NOTE — Telephone Encounter (Signed)
I sent the referral to Oncology  And the response was .   Dorma Russell,   Referral should be forwarded to infectious disease.   Thanks Tiffany I'll sent it again

## 2014-11-21 NOTE — Telephone Encounter (Signed)
Ok. I now understand the reason for the confusion.  No, she does not need ID.   Thank you for working on this [employer]

## 2014-12-13 ENCOUNTER — Telehealth: Payer: Self-pay | Admitting: Family Medicine

## 2014-12-13 DIAGNOSIS — E038 Other specified hypothyroidism: Secondary | ICD-10-CM

## 2014-12-13 DIAGNOSIS — E876 Hypokalemia: Secondary | ICD-10-CM

## 2014-12-13 DIAGNOSIS — K754 Autoimmune hepatitis: Secondary | ICD-10-CM

## 2014-12-13 NOTE — Telephone Encounter (Signed)
Patient called requesting a medication refill for,  azaTHIOprine (IMURAN, levothyroxine (SYNTHROID, LEVOTHROID,   furosemide (LASIX)      potassium chloride SA  Please follow up with Patient.

## 2014-12-16 MED ORDER — POTASSIUM CHLORIDE CRYS ER 20 MEQ PO TBCR
20.0000 meq | EXTENDED_RELEASE_TABLET | Freq: Every day | ORAL | Status: DC
Start: 2014-12-16 — End: 2015-05-07

## 2014-12-16 MED ORDER — LEVOTHYROXINE SODIUM 100 MCG PO TABS: 100.0000 ug | ORAL_TABLET | Freq: Every day | ORAL | Status: DC

## 2014-12-16 MED ORDER — AZATHIOPRINE 50 MG PO TABS: 50.0000 mg | ORAL_TABLET | Freq: Two times a day (BID) | ORAL | Status: DC

## 2014-12-16 NOTE — Telephone Encounter (Signed)
Pt Has Lasix refills  all other Rx refilled,  Refills  send to [facility]

## 2015-01-02 ENCOUNTER — Ambulatory Visit (INDEPENDENT_AMBULATORY_CARE_PROVIDER_SITE_OTHER): Payer: Medicare Other | Admitting: *Deleted

## 2015-01-02 DIAGNOSIS — I495 Sick sinus syndrome: Secondary | ICD-10-CM | POA: Diagnosis not present

## 2015-01-02 NOTE — Progress Notes (Signed)
Remote pacemaker transmission.   

## 2015-01-10 LAB — CUP PACEART REMOTE DEVICE CHECK
Battery Remaining Longevity: 97 mo
Battery Voltage: 3.02 V
Brady Statistic AP VP Percent: 0.01 %
Brady Statistic AP VS Percent: 9.6 %
Brady Statistic AS VP Percent: 0.08 %
Brady Statistic AS VS Percent: 90.31 %
Brady Statistic RA Percent Paced: 9.61 %
Brady Statistic RV Percent Paced: 0.09 %
Date Time Interrogation Session: 20161207190228
Implantable Lead Implant Date: 20141205
Implantable Lead Implant Date: 20141205
Implantable Lead Location: 753859
Implantable Lead Location: 753860
Implantable Lead Model: 5076
Implantable Lead Model: 5076
Lead Channel Impedance Value: 342 Ohm
Lead Channel Impedance Value: 418 Ohm
Lead Channel Impedance Value: 456 Ohm
Lead Channel Impedance Value: 513 Ohm
Lead Channel Pacing Threshold Amplitude: 0.375 V
Lead Channel Pacing Threshold Amplitude: 0.75 V
Lead Channel Pacing Threshold Pulse Width: 0.4 ms
Lead Channel Pacing Threshold Pulse Width: 0.4 ms
Lead Channel Sensing Intrinsic Amplitude: 1.75 mV
Lead Channel Sensing Intrinsic Amplitude: 1.75 mV
Lead Channel Sensing Intrinsic Amplitude: 5.875 mV
Lead Channel Sensing Intrinsic Amplitude: 5.875 mV
Lead Channel Setting Pacing Amplitude: 2 V
Lead Channel Setting Pacing Amplitude: 2.5 V
Lead Channel Setting Pacing Pulse Width: 0.4 ms
Lead Channel Setting Sensing Sensitivity: 0.6 mV

## 2015-01-15 ENCOUNTER — Encounter: Payer: Self-pay | Admitting: Cardiology

## 2015-01-16 ENCOUNTER — Ambulatory Visit (INDEPENDENT_AMBULATORY_CARE_PROVIDER_SITE_OTHER): Payer: Medicare Other | Admitting: Diagnostic Neuroimaging

## 2015-01-16 ENCOUNTER — Encounter: Payer: Self-pay | Admitting: Diagnostic Neuroimaging

## 2015-01-16 VITALS — BP 108/83 | HR 90 | Ht 69.0 in | Wt 216.8 lb

## 2015-01-16 DIAGNOSIS — G40909 Epilepsy, unspecified, not intractable, without status epilepticus: Secondary | ICD-10-CM | POA: Diagnosis not present

## 2015-01-16 DIAGNOSIS — R569 Unspecified convulsions: Secondary | ICD-10-CM

## 2015-01-16 DIAGNOSIS — G2119 Other drug induced secondary parkinsonism: Secondary | ICD-10-CM

## 2015-01-16 MED ORDER — LEVETIRACETAM 500 MG PO TABS: 500.0000 mg | ORAL_TABLET | Freq: Two times a day (BID) | ORAL | Status: DC

## 2015-01-16 NOTE — Progress Notes (Signed)
[facility]  PATIENT: Stacey Velazquez DOB: 1946/01/01  REFERRING CLINICIAN: Funches HISTORY FROM: patient  REASON FOR VISIT: follow up   HISTORICAL  CHIEF COMPLAINT:  Chief Complaint  Patient presents with  . Follow-up    in room 6 for Seizure disorder    HISTORY OF PRESENT ILLNESS:   UPDATE 01/16/15: Since last visit, doing about the same. No seizures. Tremor stable. Psychiatry tried benztropine, without benefit.   PRIOR HPI (10/14/14): 69 year old right-handed female here for evaluation of seizure. Patient has history of bipolar disorder, autoimmune hepatitis, benign positional vertigo, coronary artery disease, pacemaker, anxiety. Patient has history of seizures since 2000 when she developed episodes with nausea, heavy sensation, loss of consciousness. No shaking. In 2005 she was diagnosed with seizure disorder. In 2006 she was started on levetiracetam 250 mg twice a day. Patient continues abdomen episodes of seizure. Last seizures were around 2013 and 2014. July 2016 patient was admitted to the hospital for episode of nausea, vomiting, syncope. Cardiac workup was done. EEG was unremarkable. Patient was about a by neurology who recommended increasing Keppra dosing. Now she is on 5 mg twice a day. Since discharge patient is doing well. No further episodes of seizure or syncope. Separately patient has noticed some resting tremor in hands and mouth for past 5-6 months. Patient is on Risperdal medication for several years.   REVIEW OF SYSTEMS: Full 14 system review of systems performed and notable only for drooling.   ALLERGIES: Allergies  Allergen Reactions  . Ambien [Zolpidem Tartrate] Other (See Comments)    Hallucinations   . Amitriptyline Other (See Comments)    Stops heart per pt  . Ampicillin Other (See Comments)    Stops heart per pt   . Anaprox [Naproxen Sodium] Other (See Comments)    Doesn't work per pt  . Benadryl [Diphenhydramine Hcl (Sleep)]  Other (See Comments)    Rash  . Cetirizine & Related Other (See Comments)    Swelling   . Cortizone-10 [Hydrocortisone] Other (See Comments)    seizures   . Darvon [Propoxyphene] Other (See Comments)    Increased heart rate per pt   . Diazepam Other (See Comments)    Stops heart per pt  . Diflunisal Swelling and Other (See Comments)    Stops heart per pt (Dolobid)  . Duloxetine Other (See Comments)    Reaction to Cymbalta - pt doesn't remember what the reaction was  . Flexeril [Cyclobenzaprine] Other (See Comments)    Seizures   . Lactose Intolerance (Gi) Swelling and Other (See Comments)    cramping  . Lidocaine Other (See Comments)    Seizures  . Meperidine And Related Other (See Comments)    Stops heart rate (reaction to Demerol)  . Metanx [L-Methylfolate-Algae-B12-B6] Other (See Comments)    Pt does not remember reaction  . Metoclopramide Other (See Comments)    Pt does not remember reaction  . Morphine And Related Other (See Comments)    Stops heart per pt   . Nuprin [Ibuprofen] Other (See Comments)    Caused headache  . Oxycodone Other (See Comments)    Hallucinations   . Penicillins Other (See Comments)    Stops heart per pt   . Propoxyphene Other (See Comments)    Slowed heart rate per pt (reaction to Darvocet)  . Ranitidine Other (See Comments)    Pt does not remember reaction  . Vicodin [Hydrocodone-Acetaminophen] Other (See Comments)    Seizures   . Tylenol [Acetaminophen]  Rash    HOME MEDICATIONS: Outpatient Prescriptions Prior to Visit  Medication Sig Dispense Refill  . azaTHIOprine (IMURAN) 50 MG tablet Take 1 tablet (50 mg total) by mouth 2 (two) times daily. 60 tablet 2  . Cholecalciferol (VITAMIN D) 1000 UNITS capsule Take 1,000 Units by mouth daily.    . furosemide (LASIX) 20 MG tablet Take 1 tablet (20 mg total) by mouth daily. 90 tablet 3  . ketoprofen (ORUDIS) 75 MG capsule Take 1 capsule (75 mg total) by mouth 3 (three) times daily as  needed (for headache). (Patient taking differently: Take 75 mg by mouth 3 (three) times daily as needed (headache). ) 30 capsule 0  . Lactobacillus (ACIDOPHOLUS PO) Take 1 tablet by mouth daily as needed (lactose intolerance).    Marland Kitchen levETIRAcetam (KEPPRA) 500 MG tablet Take 1 tablet (500 mg total) by mouth 2 (two) times daily. 180 tablet 4  . levothyroxine (SYNTHROID, LEVOTHROID) 100 MCG tablet Take 1 tablet (100 mcg total) by mouth daily before breakfast. 30 tablet 3  . meclizine (ANTIVERT) 12.5 MG tablet Take 1 tablet (12.5 mg total) by mouth 3 (three) times daily as needed for dizziness. 90 tablet 2  . potassium chloride SA (K-DUR,KLOR-CON) 20 MEQ tablet Take 1 tablet (20 mEq total) by mouth daily. 30 tablet 3  . risperiDONE (RISPERDAL) 3 MG tablet Take 3 mg by mouth at bedtime.     No facility-administered medications prior to visit.    PAST MEDICAL HISTORY: Past Medical History  Diagnosis Date  . Bipolar 1 disorder ([place])   . Sinus arrest   . Pacemaker Medtronic     MRI compatible  . Hypotension   . Autoimmune hepatitis ([place])   . GERD (gastroesophageal reflux disease)   . Hypothyroidism   . Hepatitis, autoimmune ([place]) 08/25/2014  . Glaucoma 2003   . Seizures ([place])     last sz 08/21/14  . Vertigo     bvvp  . Anxiety   . Heart disease     PAST SURGICAL HISTORY: Past Surgical History  Procedure Laterality Date  . Pacemaker insertion  2014  . Knee surgery Left 1985  . Partial hysterectomy  1979  . Cataract extraction Left 2008    FAMILY HISTORY: Family History  Problem Relation Age of Onset  . Heart attack Father   . Heart disease Paternal Uncle   . Heart disease Paternal Grandmother   . Diabetes Paternal Grandmother   . Heart disease Paternal Grandfather   . Cancer Mother     BREAST  . Diabetes Sister   . Diabetes Brother   . Cancer Maternal Grandmother   . CVA Maternal Grandmother   . Cancer Maternal Grandfather   . CVA Maternal Grandfather   . Diabetes Brother      SOCIAL HISTORY:  Social History   Social History  . Marital Status: Married    Spouse Name: N/A  . Number of Children: N/A  . Years of Education: N/A   Occupational History  . Not on file.   Social History Main Topics  . Smoking status: Former Smoker    Quit date: 03/02/2003  . Smokeless tobacco: Not on file  . Alcohol Use: No  . Drug Use: No  . Sexual Activity: Not on file   Other Topics Concern  . Not on file   Social History Narrative   Lives alone, "have friends and relatives that come and help me"   caffeine use- coffee -1 cup daily  PHYSICAL EXAM  GENERAL EXAM/CONSTITUTIONAL: Vitals:  Filed Vitals:   01/16/15 1333  BP: 108/83  Pulse: 90  Height: 5\' 9"  (1.753 m)  Weight: 216 lb 12.8 oz (98.34 kg)   Body mass index is 32 kg/(m^2). No exam data present  Patient is in no distress; well developed, nourished and groomed; neck is supple  CARDIOVASCULAR:  Examination of carotid arteries is normal; no carotid bruits  Regular rate and rhythm, no murmurs  Examination of peripheral vascular system by observation and palpation is normal  EYES:  Ophthalmoscopic exam of optic discs and posterior segments is normal; no papilledema or hemorrhages  MUSCULOSKELETAL:  Gait, strength, tone, movements noted in Neurologic exam below  NEUROLOGIC: MENTAL STATUS:  No flowsheet data found.  awake, alert, oriented to person, place and time  recent and remote memory intact  normal attention and concentration  language fluent, comprehension intact, naming intact,   fund of knowledge appropriate  CRANIAL NERVE:   2nd - no papilledema on fundoscopic exam  2nd, 3rd, 4th, 6th - pupils equal and reactive to light, visual fields full to confrontation, extraocular muscles intact, no nystagmus  5th - facial sensation symmetric  7th - facial strength --> SLIGHT DECR RIGHT NL FOLD; SLIGHT LOWER RIGHT EYELID THAN LEFT  8th - hearing intact  9th - palate  elevates symmetrically, uvula midline  11th - shoulder shrug symmetric  12th - tongue protrusion midline  MASKED FACIES  MILD MOUTH TREMOR  MOTOR:   normal bulk; REST TREMOR IN LEFT > RIGHT HAND; MILD COGWHEELING IN BUE; MOD BRADYKINESIA IN BUE AND BLE; full strength in the BUE, BLE  SENSORY:   normal and symmetric to light touch, temperature, vibration   COORDINATION:   finger-nose-finger, fine finger movements normal  REFLEXES:   deep tendon reflexes TRACE and symmetric  GAIT/STATION:   narrow based gait; DECR ARM SWING, WITH HAND TREMORS    DIAGNOSTIC DATA (LABS, IMAGING, TESTING) - I reviewed patient records, labs, notes, testing and imaging myself where available.  Lab Results  Component Value Date   WBC 2.4* 09/25/2014   HGB 14.1 09/25/2014   HCT 41.2 09/25/2014   MCV 92.0 09/25/2014   PLT 95* 09/25/2014      Component Value Date/Time   NA 135 09/25/2014 1403   K 4.2 09/25/2014 1403   CL 102 09/25/2014 1403   CO2 25 09/25/2014 1403   GLUCOSE 86 09/25/2014 1403   BUN 10 09/25/2014 1403   CREATININE 0.83 09/25/2014 1403   CREATININE 0.64 08/25/2014 0605   CALCIUM 9.0 09/25/2014 1403   PROT 8.0 09/25/2014 1403   ALBUMIN 3.6 09/25/2014 1403   AST 49* 09/25/2014 1403   ALT 39* 09/25/2014 1403   ALKPHOS 99 09/25/2014 1403   BILITOT 0.5 09/25/2014 1403   GFRNONAA 72 09/25/2014 1403   GFRNONAA >60 08/25/2014 0605   GFRAA 83 09/25/2014 1403   GFRAA >60 08/25/2014 0605   No results found for: CHOL, HDL, LDLCALC, LDLDIRECT, TRIG, CHOLHDL No results found for: HGBA1C No results found for: VITAMINB12 Lab Results  Component Value Date   TSH 1.975 09/09/2014     08/21/14 CT head [I reviewed images myself and agree with interpretation. -VRP]  1. No acute intracranial pathology. 2. Chronic microvascular disease and cerebral atrophy.  08/22/14 EEG  - This is a normal awake electroencephalogram. No epileptiform activity is noted.  - Comment: An EEG  with the patient sleep deprived to elicit drowse and light sleep may be desirable to further  elicit a possible seizure disorder.      ASSESSMENT AND PLAN  69 y.o. year old female here with seizure disorder since year 2000. Also with recent episode of syncope which may have been a seizure as well. Agree with increased dose of levetiracetam 500 mg twice a day. Also noted is drug-induced parkinsonism symptoms, likely related to risperdal.  Dx:  Seizure disorder ([place])  Drug-induced Parkinson's disease ([place])    PLAN: - continue LEV 500mg  BID - may consider DATscan to differentiate drug-induced parkinsonism vs parkinson's disease  Meds ordered this encounter  Medications  . levETIRAcetam (KEPPRA) 500 MG tablet    Sig: Take 1 tablet (500 mg total) by mouth 2 (two) times daily.    Dispense:  180 tablet    Refill:  4   Return in about 1 year (around 01/16/2016).    Penni Bombard, MD [id number], [id number] PM Certified in Neurology, Neurophysiology and Neuroimaging  [facility] [address] [place], [place] [address] [phone]

## 2015-01-16 NOTE — Patient Instructions (Signed)
Continue current medications. 

## 2015-01-28 ENCOUNTER — Telehealth: Payer: Self-pay | Admitting: Family Medicine

## 2015-01-28 DIAGNOSIS — F319 Bipolar disorder, unspecified: Secondary | ICD-10-CM | POA: Diagnosis not present

## 2015-01-28 DIAGNOSIS — K754 Autoimmune hepatitis: Secondary | ICD-10-CM

## 2015-01-28 NOTE — Telephone Encounter (Signed)
Pt. Called stating that her insurance needs prior authorization for the medication of azaTHIOprine (IMURAN) 50 MG tablet. Pt. Insurance number is [id number]. Please f/u

## 2015-01-30 NOTE — Telephone Encounter (Signed)
Please call patient,   Prior autho for imuran which is for her autoimmune hepatitis should be done by her GI doctor. Is she still going to [facility] for GI? If not, I have placed a referral so she can establish with GI.

## 2015-01-31 NOTE — Telephone Encounter (Signed)
Pt stated not going to [facility] and going to be out of Imuran in 3 days  Notified referral placed  Pt stated preview PCP was refilling Imuran for her

## 2015-01-31 NOTE — Telephone Encounter (Signed)
Called patient's [facility] to have PA form faxed over. Spoke to her pharmacist  PA form will be faxed   Awaiting fax

## 2015-02-10 ENCOUNTER — Other Ambulatory Visit: Payer: Self-pay | Admitting: Gastroenterology

## 2015-02-10 DIAGNOSIS — K7460 Unspecified cirrhosis of liver: Secondary | ICD-10-CM | POA: Diagnosis not present

## 2015-02-10 DIAGNOSIS — Z1211 Encounter for screening for malignant neoplasm of colon: Secondary | ICD-10-CM | POA: Diagnosis not present

## 2015-02-10 DIAGNOSIS — E039 Hypothyroidism, unspecified: Secondary | ICD-10-CM | POA: Diagnosis not present

## 2015-02-10 DIAGNOSIS — K7469 Other cirrhosis of liver: Secondary | ICD-10-CM

## 2015-02-10 DIAGNOSIS — K754 Autoimmune hepatitis: Secondary | ICD-10-CM | POA: Diagnosis not present

## 2015-02-19 ENCOUNTER — Other Ambulatory Visit: Payer: Medicare Other

## 2015-02-19 ENCOUNTER — Ambulatory Visit
Admission: RE | Admit: 2015-02-19 | Discharge: 2015-02-19 | Disposition: A | Discharge status: Home / Self Care | Payer: Medicare Other | Source: Ambulatory Visit | Attending: Gastroenterology | Admitting: Gastroenterology

## 2015-02-19 DIAGNOSIS — K7469 Other cirrhosis of liver: Secondary | ICD-10-CM | POA: Diagnosis not present

## 2015-02-19 IMAGING — US US ABDOMEN LIMITED
1 series · 14 of 25 positions shown · non-contrast
Comparison: None.

CLINICAL DATA: Cirrhosis.

EXAM:
US ABDOMEN LIMITED - RIGHT UPPER QUADRANT

[Series 1: us abdomen limited · 0.19mm/px · 14 of 30 slices shown]
[im 1/30]
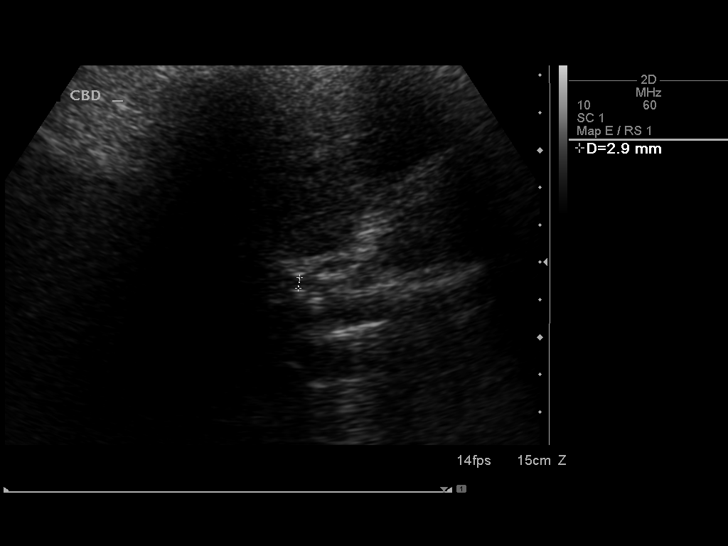
[im 3/30]
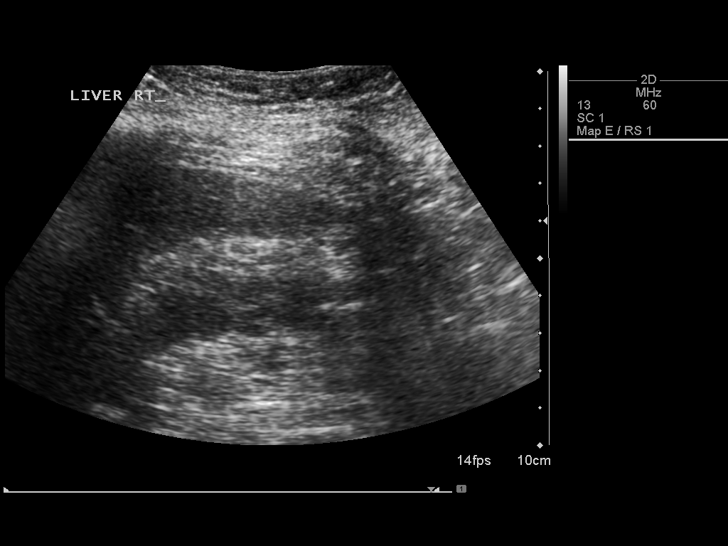
[im 5/30]
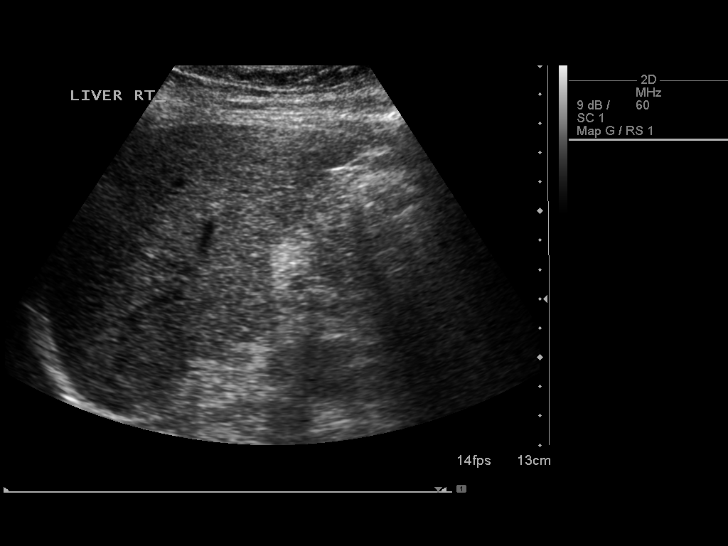
[im 8/30]
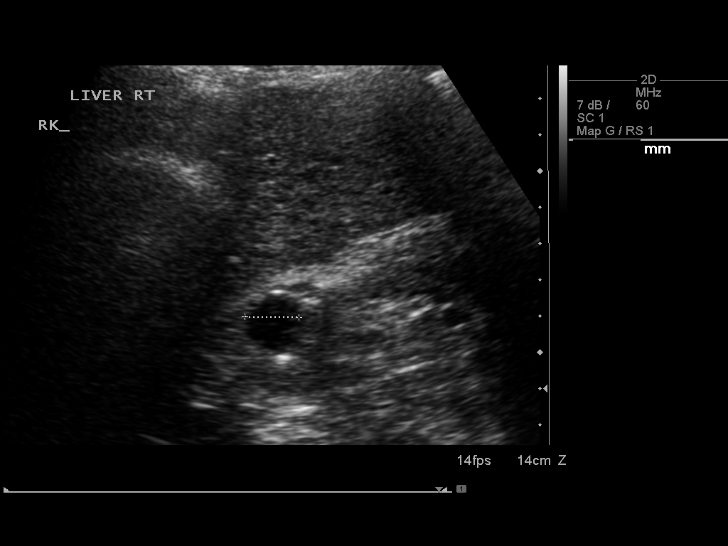
[im 10/30]
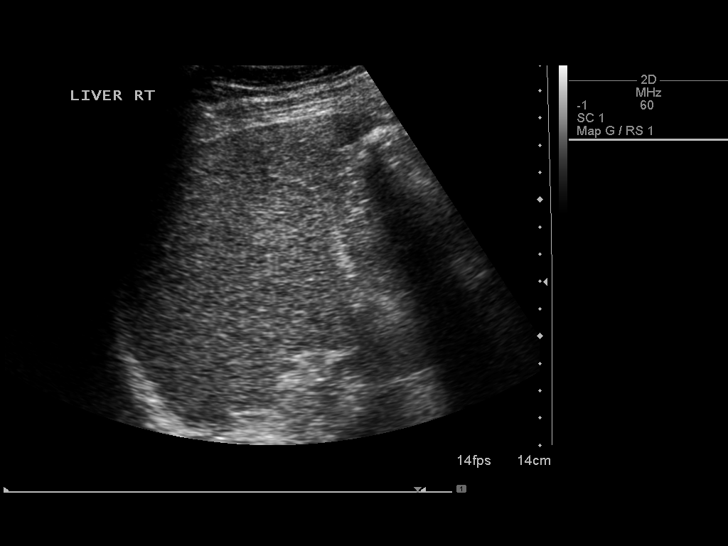
[im 11/30]
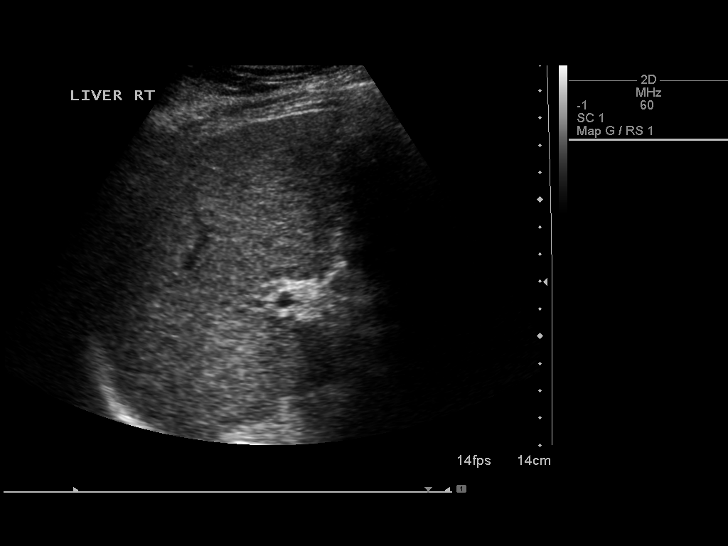
[im 14/30]
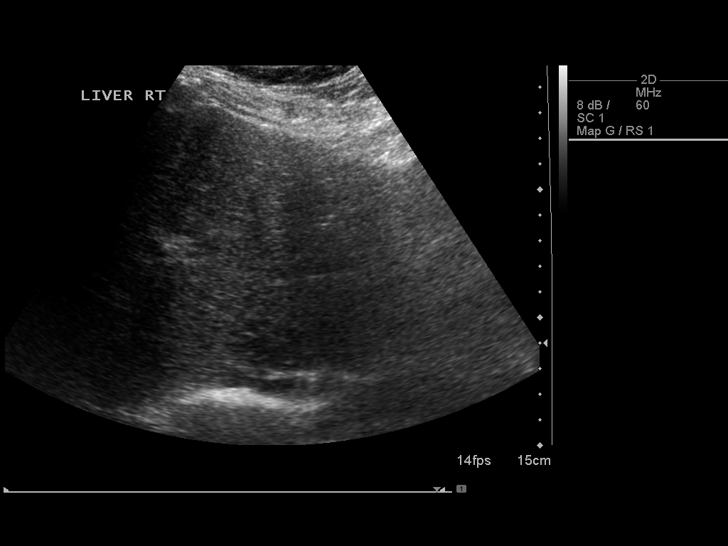
[im 16/30]
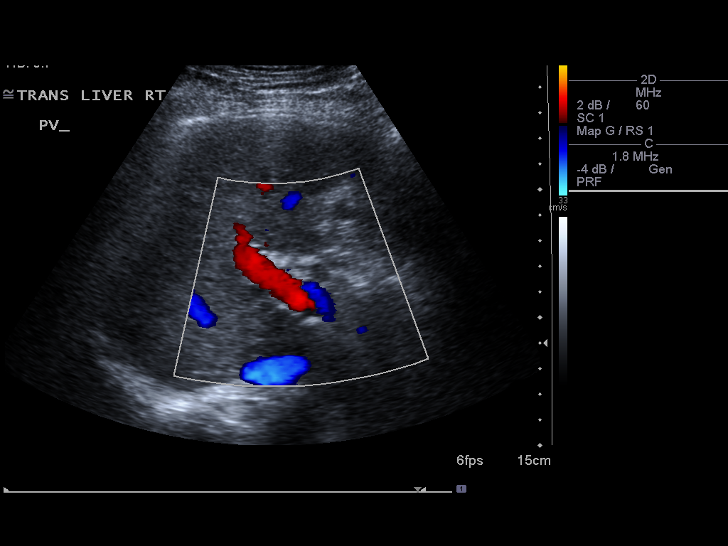
[im 19/30]
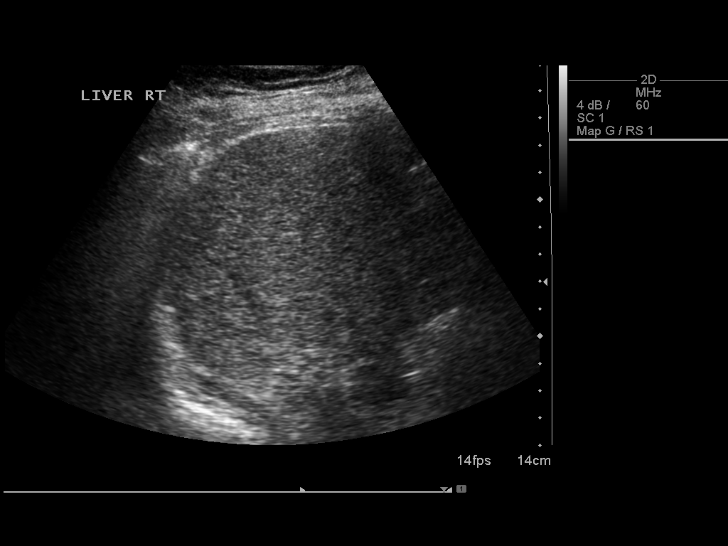
[im 20/30]
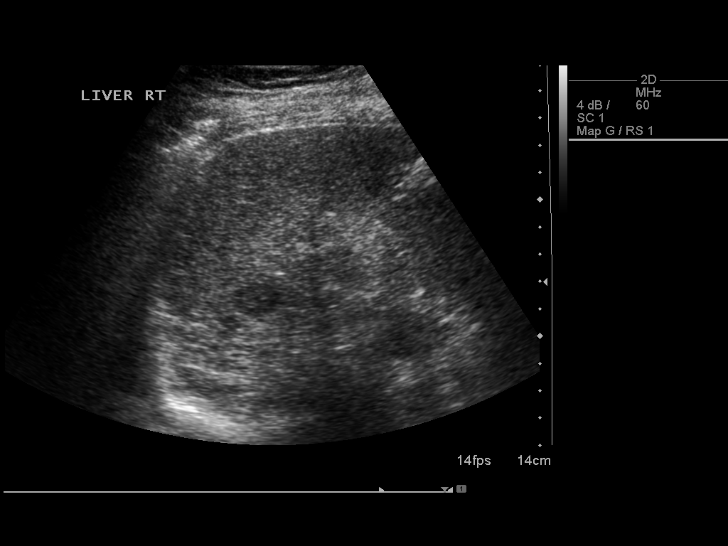
[im 22/30]
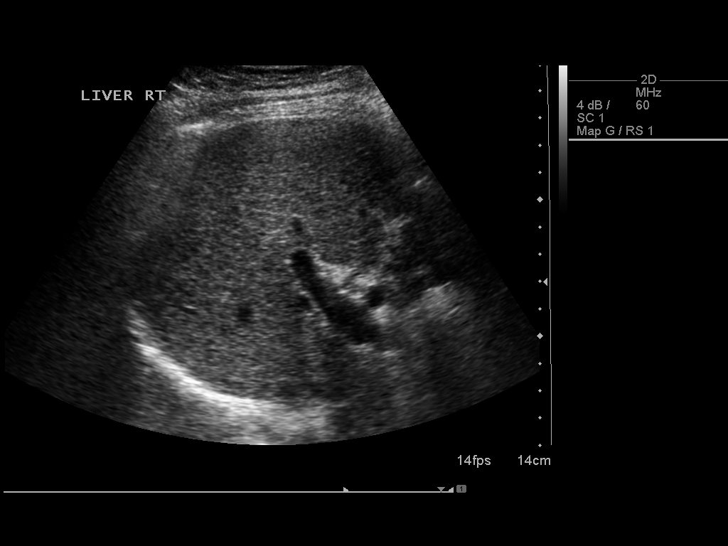
[im 25/30]
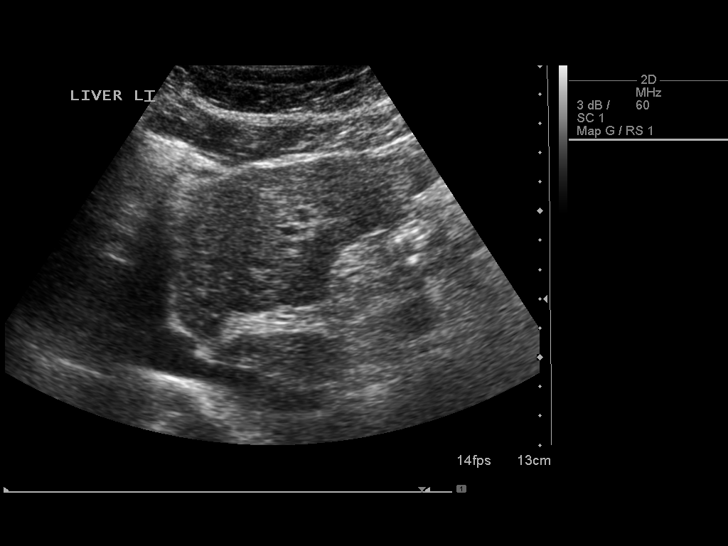
[im 27/30]
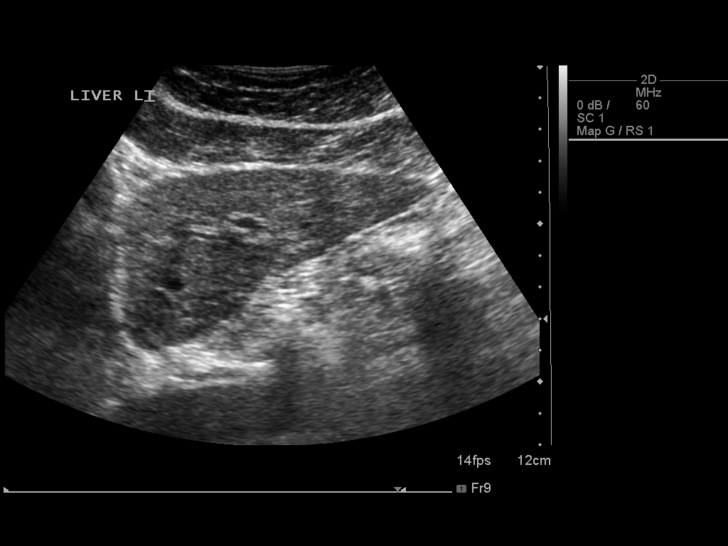
[im 30/30]
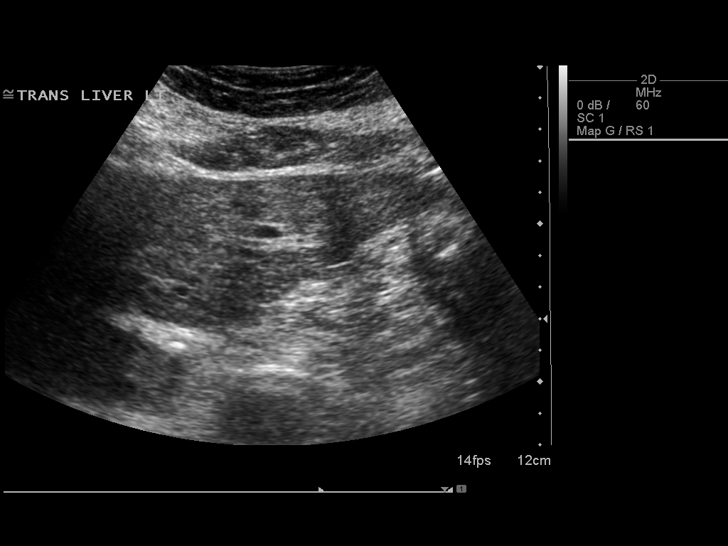

[14 of 25 positions shown; findings below may reference images not displayed]

FINDINGS: Gallbladder:

Prior cholecystectomy

Common bile duct:

Diameter: Normal caliber, 3 mm.

Liver:

Heterogeneous echotexture. Subtle nodularity to the liver contours,
best seen in the left hepatic lobe compatible the patient's history
of cirrhosis. No focal abnormality.
IMPRESSION: Changes of cirrhosis.  No focal abnormality.

Prior cholecystectomy.

## 2015-02-24 DIAGNOSIS — K754 Autoimmune hepatitis: Secondary | ICD-10-CM | POA: Diagnosis not present

## 2015-02-24 DIAGNOSIS — C44319 Basal cell carcinoma of skin of other parts of face: Secondary | ICD-10-CM | POA: Diagnosis not present

## 2015-02-24 DIAGNOSIS — K7460 Unspecified cirrhosis of liver: Secondary | ICD-10-CM | POA: Diagnosis not present

## 2015-02-24 DIAGNOSIS — Z1211 Encounter for screening for malignant neoplasm of colon: Secondary | ICD-10-CM | POA: Diagnosis not present

## 2015-02-24 DIAGNOSIS — E039 Hypothyroidism, unspecified: Secondary | ICD-10-CM | POA: Diagnosis not present

## 2015-02-24 DIAGNOSIS — C44311 Basal cell carcinoma of skin of nose: Secondary | ICD-10-CM | POA: Diagnosis not present

## 2015-02-25 LAB — CMP AND LIVER
AG Ratio: 0.8
ALT: 29
AST: 25 U/L
Albumin: 3.4
Calcium: 8.6 mg/dL
Creatine, Serum: 0.75
GFR calc non Af Amer: 81
Globulin, Total: 4.4
Glucose: 106
IgG, Qn, Serum: 3092

## 2015-03-03 ENCOUNTER — Encounter: Payer: Self-pay | Admitting: Family Medicine

## 2015-03-20 ENCOUNTER — Telehealth: Payer: Self-pay | Admitting: Internal Medicine

## 2015-03-20 ENCOUNTER — Other Ambulatory Visit: Payer: Self-pay | Admitting: *Deleted

## 2015-03-20 MED ORDER — FUROSEMIDE 20 MG PO TABS: 20.0000 mg | ORAL_TABLET | Freq: Every day | ORAL | Status: DC

## 2015-03-20 NOTE — Telephone Encounter (Signed)
Pt left a message on my voicemail to request refill of Furosimide 20mg -90 days supply

## 2015-03-24 DIAGNOSIS — K754 Autoimmune hepatitis: Secondary | ICD-10-CM | POA: Diagnosis not present

## 2015-03-24 DIAGNOSIS — K7460 Unspecified cirrhosis of liver: Secondary | ICD-10-CM | POA: Diagnosis not present

## 2015-04-02 ENCOUNTER — Encounter: Payer: Self-pay | Admitting: Internal Medicine

## 2015-04-02 ENCOUNTER — Ambulatory Visit (INDEPENDENT_AMBULATORY_CARE_PROVIDER_SITE_OTHER): Payer: Medicare Other | Admitting: Internal Medicine

## 2015-04-02 VITALS — BP 128/90 | HR 97 | Ht 69.0 in | Wt 215.8 lb

## 2015-04-02 DIAGNOSIS — I495 Sick sinus syndrome: Secondary | ICD-10-CM

## 2015-04-02 DIAGNOSIS — Z950 Presence of cardiac pacemaker: Secondary | ICD-10-CM

## 2015-04-02 MED ORDER — METOPROLOL SUCCINATE ER 25 MG PO TB24: 25.0000 mg | ORAL_TABLET | Freq: Every day | ORAL | Status: DC

## 2015-04-02 MED ORDER — OMEPRAZOLE 20 MG PO CPDR: 20.0000 mg | DELAYED_RELEASE_CAPSULE | Freq: Every day | ORAL | Status: DC

## 2015-04-02 MED ORDER — METOPROLOL TARTRATE 25 MG PO TABS: 25.0000 mg | ORAL_TABLET | Freq: Two times a day (BID) | ORAL | Status: DC

## 2015-04-02 MED ORDER — ATENOLOL 25 MG PO TABS: 25.0000 mg | ORAL_TABLET | Freq: Every day | ORAL | Status: DC

## 2015-04-02 NOTE — Progress Notes (Signed)
Patient Care Team: Boykin Nearing, MD as PCP - General (Family Medicine)   HPI  Stacey Velazquez is a 70 y.o. female Seen to establish pacemaker followup. This was implanted 12/14 for sinus arrest and syncope. It was done in [place]. We do not have records. She does not recall any specific cardiac evaluation.   She has no known coronary artery disease.  She has had chest discomfort. It occurs primarily at night. It is associated with brackish taste.  She's also been noted to have rapid heart rates upon walking into the adult daycare center. Her heart rates have been the low 100s. This can be associated with some shortness of breath.  TSH was 1.9  8/16  She's having problems with dizziness. This is primarily recumbent and seated. She's been seen by neurology.  She lives in a group home. She has 2 daughters.   LV EF was normal 7/16      Past Medical History  Diagnosis Date  . Bipolar 1 disorder ([place])   . Sinus arrest   . Pacemaker Medtronic     MRI compatible  . Hypotension   . Autoimmune hepatitis ([place])   . GERD (gastroesophageal reflux disease)   . Hypothyroidism   . Hepatitis, autoimmune ([place]) 08/25/2014  . Glaucoma 2003   . Seizures ([place])     last sz 08/21/14  . Vertigo     bvvp  . Anxiety   . Heart disease     Past Surgical History  Procedure Laterality Date  . Pacemaker insertion  2014  . Knee surgery Left 1985  . Partial hysterectomy  1979  . Cataract extraction Left 2008    Current Outpatient Prescriptions  Medication Sig Dispense Refill  . azaTHIOprine (IMURAN) 50 MG tablet Take 1 tablet (50 mg total) by mouth 2 (two) times daily. 60 tablet 2  . benztropine (COGENTIN) 0.5 MG tablet   0  . Cholecalciferol (VITAMIN D) 1000 UNITS capsule Take 1,000 Units by mouth daily.    . furosemide (LASIX) 20 MG tablet Take 1 tablet (20 mg total) by mouth daily. 90 tablet 0  . ketoprofen (ORUDIS) 75 MG capsule Take 1 capsule (75 mg total) by mouth 3 (three)  times daily as needed (for headache). (Patient taking differently: Take 75 mg by mouth 3 (three) times daily as needed (headache). ) 30 capsule 0  . Lactobacillus (ACIDOPHOLUS PO) Take 1 tablet by mouth daily as needed (lactose intolerance).    Marland Kitchen levETIRAcetam (KEPPRA) 500 MG tablet Take 1 tablet (500 mg total) by mouth 2 (two) times daily. 180 tablet 4  . levothyroxine (SYNTHROID, LEVOTHROID) 100 MCG tablet Take 1 tablet (100 mcg total) by mouth daily before breakfast. 30 tablet 3  . meclizine (ANTIVERT) 12.5 MG tablet Take 1 tablet (12.5 mg total) by mouth 3 (three) times daily as needed for dizziness. 90 tablet 2  . potassium chloride SA (K-DUR,KLOR-CON) 20 MEQ tablet Take 1 tablet (20 mEq total) by mouth daily. 30 tablet 3  . predniSONE (DELTASONE) 10 MG tablet Take 10 mg by mouth daily with breakfast.    . risperiDONE (RISPERDAL) 3 MG tablet Take 3 mg by mouth at bedtime.     No current facility-administered medications for this visit.    Allergies  Allergen Reactions  . Ambien [Zolpidem Tartrate] Other (See Comments)    Hallucinations   . Amitriptyline Other (See Comments)    Stops heart per pt  . Ampicillin Other (See Comments)  Stops heart per pt   . Anaprox [Naproxen Sodium] Other (See Comments)    Doesn't work per pt  . Benadryl [Diphenhydramine Hcl (Sleep)] Other (See Comments)    Rash  . Cetirizine & Related Other (See Comments)    Swelling   . Cortizone-10 [Hydrocortisone] Other (See Comments)    seizures   . Darvon [Propoxyphene] Other (See Comments)    Increased heart rate per pt   . Diazepam Other (See Comments)    Stops heart per pt  . Diflunisal Swelling and Other (See Comments)    Stops heart per pt (Dolobid)  . Duloxetine Other (See Comments)    Reaction to Cymbalta - pt doesn't remember what the reaction was  . Flexeril [Cyclobenzaprine] Other (See Comments)    Seizures   . Lactose Intolerance (Gi) Swelling and Other (See Comments)    cramping  .  Lidocaine Other (See Comments)    Seizures  . Meperidine And Related Other (See Comments)    Stops heart rate (reaction to Demerol)  . Metanx [L-Methylfolate-Algae-B12-B6] Other (See Comments)    Pt does not remember reaction  . Metoclopramide Other (See Comments)    Pt does not remember reaction  . Morphine And Related Other (See Comments)    Stops heart per pt   . Nuprin [Ibuprofen] Other (See Comments)    Caused headache  . Oxycodone Other (See Comments)    Hallucinations   . Penicillins Other (See Comments)    Stops heart per pt   . Propoxyphene Other (See Comments)    Slowed heart rate per pt (reaction to Darvocet)  . Ranitidine Other (See Comments)    Pt does not remember reaction  . Vicodin [Hydrocodone-Acetaminophen] Other (See Comments)    Seizures   . Tylenol [Acetaminophen] Rash    Review of Systems negative except from HPI and PMH  Physical Exam BP 128/90 mmHg  Pulse 97  Ht 5\' 9"  (1.753 m)  Wt 215 lb 12.8 oz (97.886 kg)  BMI 31.85 kg/m2 Well developed and well nourished in no acute distress HENT normal E scleral and icterus clear Neck Supple JVP flat  carotids brisk and full Clear to ausculation Device pocket well healed; without hematoma or erythema.  There is no tethering Regular rate and rhythm, no murmurs gallops or rub Soft with active bowel sounds No clubbing cyanosis trace Edema Alert and oriented, grossly normal motor and sensory function Skin Warm and Dry  ECG sinus tach including EEG 7 Intervals 16/08/34 Axis rightward & infrequent PVCs Low-voltage  Assessment and  Plan  Sinus node dysfunction  Pacemaker-Medtronic  The patient's device was interrogated.  The information was reviewed. No changes were made in the programming.     Sinus tachycardia  Recumbent chest pain  Low voltage ECG   The patient has symptoms consistent with GE reflux disease. We'll put her on omeprazole for 8 week trial.  Her histogram data suggests right  shifting. She has a propensity to sinus tachycardia. We will try her on low-dose beta blockers and I've given her a prescription for atenolol 25, metoprolol succinate 25, metoprolol tartrate 25 twice a day to take in sequence  We'll have her follow-up with RU PA in about 4 weeks.

## 2015-04-02 NOTE — Patient Instructions (Signed)
Medication Instructions: You are being given prescriptions for 3 different medications to try. You may try these in any order, but DO NOT take these at the same time. 1) Metoprolol succinate 25 mg one tablet by mouth once daily 2) Atenolol 25 mg one tablet by mouth once daily 3) Metoprolol tartrate 25 mg one tablet by mouth twice daily  - Start Oemprazole 20 mg one tablet by mouth once daily x 8 weeks.  Labwork: - none  Procedures/Testing: - non  Follow-Up: - Your physician recommends that you schedule a follow-up appointment in: 4 weeks with Tommye Standard, PA for Dr. Caryl Comes.  Any Additional Special Instructions Will Be Listed Below (If Applicable).     If you need a refill on your cardiac medications before your next appointment, please call your pharmacy.

## 2015-04-03 LAB — CUP PACEART INCLINIC DEVICE CHECK
Battery Remaining Longevity: 95 mo
Battery Voltage: 3.02 V
Brady Statistic AP VP Percent: 0.02 %
Brady Statistic AP VS Percent: 11.14 %
Brady Statistic AS VP Percent: 0.08 %
Brady Statistic AS VS Percent: 88.76 %
Brady Statistic RA Percent Paced: 11.16 %
Brady Statistic RV Percent Paced: 0.1 %
Date Time Interrogation Session: 20170308181501
Implantable Lead Implant Date: 20141205
Implantable Lead Implant Date: 20141205
Implantable Lead Location: 753859
Implantable Lead Location: 753860
Implantable Lead Model: 5076
Implantable Lead Model: 5076
Lead Channel Impedance Value: 380 Ohm
Lead Channel Impedance Value: 418 Ohm
Lead Channel Impedance Value: 513 Ohm
Lead Channel Impedance Value: 532 Ohm
Lead Channel Pacing Threshold Amplitude: 0.375 V
Lead Channel Pacing Threshold Amplitude: 0.75 V
Lead Channel Pacing Threshold Pulse Width: 0.4 ms
Lead Channel Pacing Threshold Pulse Width: 0.4 ms
Lead Channel Sensing Intrinsic Amplitude: 2 mV
Lead Channel Sensing Intrinsic Amplitude: 4.125 mV
Lead Channel Sensing Intrinsic Amplitude: 4.125 mV
Lead Channel Sensing Intrinsic Amplitude: 7.625 mV
Lead Channel Setting Pacing Amplitude: 2 V
Lead Channel Setting Pacing Amplitude: 2.5 V
Lead Channel Setting Pacing Pulse Width: 0.4 ms
Lead Channel Setting Sensing Sensitivity: 0.6 mV

## 2015-04-10 ENCOUNTER — Encounter: Payer: Self-pay | Admitting: Physician Assistant

## 2015-04-14 DIAGNOSIS — K754 Autoimmune hepatitis: Secondary | ICD-10-CM | POA: Diagnosis not present

## 2015-04-14 DIAGNOSIS — K7460 Unspecified cirrhosis of liver: Secondary | ICD-10-CM | POA: Diagnosis not present

## 2015-04-21 ENCOUNTER — Telehealth: Payer: Self-pay | Admitting: Physician Assistant

## 2015-04-21 DIAGNOSIS — F319 Bipolar disorder, unspecified: Secondary | ICD-10-CM | POA: Diagnosis not present

## 2015-04-21 NOTE — Telephone Encounter (Signed)
New Message:  Pt called in wanting to discuss the medication Metoprolol Succinate. She was suppose to come back in to see Renee before getting any refills. Please f/u with her

## 2015-04-21 NOTE — Telephone Encounter (Signed)
No VM setup and no answer

## 2015-04-23 DIAGNOSIS — C44311 Basal cell carcinoma of skin of nose: Secondary | ICD-10-CM | POA: Diagnosis not present

## 2015-04-24 ENCOUNTER — Telehealth: Payer: Self-pay | Admitting: *Deleted

## 2015-04-24 ENCOUNTER — Telehealth: Payer: Self-pay | Admitting: Physician Assistant

## 2015-04-24 NOTE — Telephone Encounter (Signed)
Tried again to reach pt. No answer. No voicemail

## 2015-04-24 NOTE — Telephone Encounter (Signed)
LMOVM TO GET IN CONTACT W/ MOTHER  DUE TO NO VOICE MAIL AND LEFT CLINIC NUMBER TO CALL BACK

## 2015-04-24 NOTE — Telephone Encounter (Signed)
F/u  Pt returning RN phon ecall- please use pt mobile- [phone]. Please call back and discuss.

## 2015-05-01 ENCOUNTER — Telehealth: Payer: Self-pay | Admitting: Family Medicine

## 2015-05-01 DIAGNOSIS — E876 Hypokalemia: Secondary | ICD-10-CM

## 2015-05-01 DIAGNOSIS — E038 Other specified hypothyroidism: Secondary | ICD-10-CM

## 2015-05-01 NOTE — Telephone Encounter (Signed)
Pt need a refill of  Potassium & Levothyroxine . Pt use  [facility] [address] Thank you

## 2015-05-02 ENCOUNTER — Encounter: Payer: Medicare Other | Admitting: Physician Assistant

## 2015-05-07 MED ORDER — LEVOTHYROXINE SODIUM 100 MCG PO TABS: 100.0000 ug | ORAL_TABLET | Freq: Every day | ORAL | Status: DC

## 2015-05-07 MED ORDER — POTASSIUM CHLORIDE CRYS ER 20 MEQ PO TBCR: 20.0000 meq | EXTENDED_RELEASE_TABLET | Freq: Every day | ORAL | Status: DC

## 2015-05-07 NOTE — Telephone Encounter (Signed)
Requested meds refilled.

## 2015-05-30 ENCOUNTER — Other Ambulatory Visit: Payer: Self-pay | Admitting: Internal Medicine

## 2015-05-30 NOTE — Telephone Encounter (Signed)
Pharmacy requesting a refill but per office visit, patient was only to take this for 8 weeks. Please advise. Thanks, MI

## 2015-05-30 NOTE — Telephone Encounter (Signed)
OK to refill x #30 w/ no refills- further refills through her PCP.  Thanks!

## 2015-06-16 ENCOUNTER — Other Ambulatory Visit: Payer: Self-pay | Admitting: Internal Medicine

## 2015-06-24 ENCOUNTER — Ambulatory Visit (INDEPENDENT_AMBULATORY_CARE_PROVIDER_SITE_OTHER): Payer: Medicare Other | Admitting: Physician Assistant

## 2015-06-24 ENCOUNTER — Encounter: Payer: Self-pay | Admitting: Physician Assistant

## 2015-06-24 VITALS — BP 124/60 | HR 100 | Ht 69.0 in | Wt 178.0 lb

## 2015-06-24 DIAGNOSIS — Z950 Presence of cardiac pacemaker: Secondary | ICD-10-CM | POA: Diagnosis not present

## 2015-06-24 DIAGNOSIS — I495 Sick sinus syndrome: Secondary | ICD-10-CM | POA: Diagnosis not present

## 2015-06-24 NOTE — Patient Instructions (Signed)
Medication Instructions:   Your physician recommends that you continue on your current medications as directed. Please refer to the Current Medication list given to you today.   If you need a refill on your cardiac medications before your next appointment, please call your pharmacy.  Labwork:  NONE ORDER TODAY    Testing/Procedures: NONE ORDER TODAY    Follow-Up:  Your physician wants you to follow-up in:  [place]  [place] will receive a reminder letter in the mail two months in advance. If you don't receive a letter, please call our office to schedule the follow-up appointment.  Remote monitoring is used to monitor your Pacemaker of ICD from home. This monitoring reduces the number of office visits required to check your device to one time per year. It allows [place] to keep an eye on the functioning of your device to ensure it is working properly. You are scheduled for a device check from home on . 09/24/2015..You may send your transmission at any time that day. If you have a wireless device, the transmission will be sent automatically. After your physician reviews your transmission, you will receive a postcard with your next transmission date.     Any Other Special Instructions Will Be Listed Below (If Applicable).

## 2015-06-24 NOTE — Progress Notes (Signed)
Cardiology Office Note Date:  06/24/2015  Patient ID:  Stacey Velazquez, Stacey Velazquez 1945/09/18, MRN [id number] PCP:  Minerva Ends, MD  Cardiologist/Elecrophysiologist: Dr. Caryl Comes    Chief Complaint:  Routine pacer check, f/u  History of Present Illness: Stacey Velazquez is a 70 y.o. female with history of sinus arrest/syncope s/p PPM, hypothyroidism, Bipolar disorder, vertigo, autoimmune hepatitis, seizure d/o, and noted by neuro drug induced Parkinson's with tremor.  She comes today to be seen for Dr. Caryl Comes, last seen by him March  At that time with some nighttime chest discomfort c/w GERD and given a course of omeprazole, and palpitations noting sinus tach, started on BB. Also mentioned dizziness while seated seeing neurology.  The patient reports all of these symptoms have resolve, she is seeing GI and c/w neurology.  She comes today feeling well, she denies any kind of CP, palpitations or SOB, no near syncope or syncope.  Past Medical History  Diagnosis Date  . Bipolar 1 disorder ([place])   . Sinus arrest   . Pacemaker Medtronic     MRI compatible  . Hypotension   . Autoimmune hepatitis ([place])   . GERD (gastroesophageal reflux disease)   . Hypothyroidism   . Hepatitis, autoimmune ([place]) 08/25/2014  . Glaucoma 2003   . Seizures ([place])     last sz 08/21/14  . Vertigo     bvvp  . Anxiety   . Heart disease     Past Surgical History  Procedure Laterality Date  . Pacemaker insertion  2014  . Knee surgery Left 1985  . Partial hysterectomy  1979  . Cataract extraction Left 2008    Current Outpatient Prescriptions  Medication Sig Dispense Refill  . atenolol (TENORMIN) 25 MG tablet Take 1 tablet (25 mg total) by mouth daily. 30 tablet 0  . azaTHIOprine (IMURAN) 50 MG tablet Take 1 tablet (50 mg total) by mouth 2 (two) times daily. 60 tablet 2  . benztropine (COGENTIN) 1 MG tablet Take 1 mg by mouth 2 (two) times daily.   0  . Cholecalciferol (VITAMIN D) 1000 UNITS capsule Take 1,000  Units by mouth daily.    . furosemide (LASIX) 20 MG tablet take 1 tablet by mouth once daily 90 tablet 0  . ketoprofen (ORUDIS) 75 MG capsule Take 1 capsule (75 mg total) by mouth 3 (three) times daily as needed (for headache). (Patient taking differently: Take 75 mg by mouth 3 (three) times daily as needed (headache). ) 30 capsule 0  . Lactobacillus (ACIDOPHOLUS PO) Take 1 tablet by mouth daily as needed (lactose intolerance).    Marland Kitchen levETIRAcetam (KEPPRA) 500 MG tablet Take 1 tablet (500 mg total) by mouth 2 (two) times daily. 180 tablet 4  . levothyroxine (SYNTHROID, LEVOTHROID) 100 MCG tablet Take 1 tablet (100 mcg total) by mouth daily before breakfast. 30 tablet 3  . meclizine (ANTIVERT) 12.5 MG tablet Take 1 tablet (12.5 mg total) by mouth 3 (three) times daily as needed for dizziness. 90 tablet 2  . metoprolol succinate (TOPROL-XL) 25 MG 24 hr tablet Take 1 tablet (25 mg total) by mouth daily. 30 tablet 0  . omeprazole (PRILOSEC) 20 MG capsule Take 1 capsule (20 mg total) by mouth daily. Further refills need to be authorized by primary care physician. 30 capsule 0  . potassium chloride SA (K-DUR,KLOR-CON) 20 MEQ tablet Take 1 tablet (20 mEq total) by mouth daily. 30 tablet 3  . risperiDONE (RISPERDAL) 3 MG tablet Take 3 mg by mouth  at bedtime.     No current facility-administered medications for this visit.    Allergies:   Ambien; Amitriptyline; Ampicillin; Anaprox; Benadryl; Cetirizine & related; Cortizone-10; Darvon; Diazepam; Diflunisal; Duloxetine; Flexeril; Lactose intolerance (gi); Lidocaine; Meperidine and related; Metanx; Metoclopramide; Morphine and related; Nuprin; Oxycodone; Penicillins; Propoxyphene; Ranitidine; Vicodin; and Tylenol   Social History:  The patient  reports that she quit smoking about 12 years ago. She does not have any smokeless tobacco history on file. She reports that she does not drink alcohol or use illicit drugs.   Family History:  The patient's family  history includes CVA in her maternal grandfather and maternal grandmother; Cancer in her maternal grandfather, maternal grandmother, and mother; Diabetes in her brother, brother, paternal grandmother, and sister; Heart attack in her father; Heart disease in her paternal grandfather, paternal grandmother, and paternal uncle.   ROS:  Please see the history of present illness.    All other systems are reviewed and otherwise negative.   PHYSICAL EXAM:  VS:  BP 124/60 mmHg  Pulse 100  Ht 5\' 9"  (1.753 m)  Wt 178 lb (80.74 kg)  BMI 26.27 kg/m2 BMI: Body mass index is 26.27 kg/(m^2). Well nourished, well developed, in no acute distress HEENT: normocephalic, atraumatic Neck: no JVD, carotid bruits or masses Cardiac:  normal S1, S2; RRR; 1/6 SM, no rubs, or gallops Lungs:  clear to auscultation bilaterally, no wheezing, rhonchi or rales Abd: soft, nontender MS: no deformity or atrophy Ext: trace edema, warm, no cyanosis Skin: warm and dry, no rash Neuro:  No gross deficits appreciated, resting tremor appreciated R hand more so Psych: euthymic mood, full affect  PPM site is stable, no tethering or discomfort   EKG:  04/02/15 SR, low voltage, q III, aVF DEVICE check today: chronically small R waves, battery status is good, intact function, no arrhythmias, see pace art  08/24/14: Echocardiogram Study Conclusions - Left ventricle: The cavity size was normal. There was mild  concentric hypertrophy. Wall motion was normal; there were no  regional wall motion abnormalities. Doppler parameters are  consistent with abnormal left ventricular relaxation (grade 1  diastolic dysfunction). - Mitral valve: There was trivial regurgitation. - Right ventricle: Pacer wire or catheter noted in right ventricle.  04/16/14: Echcoardiogram Study Conclusions - Left ventricle: The cavity size was normal. There was mild focal basal hypertrophy of the septum. Systolic function was normal. The estimated  ejection fraction was in the range of 55% to 60%. Wall motion was normal; there were no regional wall motion abnormalities. Doppler parameters are consistent with abnormal left ventricular relaxation (grade 1 diastolic dysfunction). Impressions: - Normal LV function; grade 1 diastolic dysfunction; trace MR.   Recent Labs: 08/23/2014: Magnesium 1.8 09/09/2014: TSH 1.975 09/25/2014: BUN 10; Creat 0.83; Hemoglobin 14.1; Platelets 95*; Potassium 4.2; Sodium 135 02/24/2015: ALT 29  No results found for requested labs within last 365 days.   CrCl cannot be calculated (Patient has no serum creatinine result on file.).   Wt Readings from Last 3 Encounters:  06/24/15 178 lb (80.74 kg)  04/02/15 215 lb 12.8 oz (97.886 kg)  01/16/15 216 lb 12.8 oz (98.34 kg)     Other studies reviewed: Additional studies/records reviewed today include: summarized above  DEVICE information: MDT dual chamber PPM, implanted 12/29/12 (in [place])  ASSESSMENT AND PLAN:  1. PPM (sinus node dysfunction)     device functioning normally, see pace art     q 35mo [employer] remotes    Disposition: q 66mo remotes, Dr.  Caryl Comes 72months  Current medicines are reviewed at length with the patient today.  The patient did not have any concerns regarding medicines.  Haywood Lasso, PA-C 06/24/2015 5:08 PM     [address] [place] [id number] [phone] (office)  [phone] (fax)

## 2015-06-30 ENCOUNTER — Other Ambulatory Visit: Payer: Self-pay | Admitting: Internal Medicine

## 2015-07-07 DIAGNOSIS — F319 Bipolar disorder, unspecified: Secondary | ICD-10-CM | POA: Diagnosis not present

## 2015-07-14 DIAGNOSIS — K754 Autoimmune hepatitis: Secondary | ICD-10-CM | POA: Diagnosis not present

## 2015-08-28 DIAGNOSIS — F319 Bipolar disorder, unspecified: Secondary | ICD-10-CM | POA: Diagnosis not present

## 2015-09-22 ENCOUNTER — Other Ambulatory Visit: Payer: Self-pay | Admitting: Internal Medicine

## 2015-10-13 DIAGNOSIS — K754 Autoimmune hepatitis: Secondary | ICD-10-CM | POA: Diagnosis not present

## 2015-10-13 DIAGNOSIS — C4441 Basal cell carcinoma of skin of scalp and neck: Secondary | ICD-10-CM | POA: Diagnosis not present

## 2015-10-13 DIAGNOSIS — Z1211 Encounter for screening for malignant neoplasm of colon: Secondary | ICD-10-CM | POA: Diagnosis not present

## 2015-10-13 DIAGNOSIS — K7460 Unspecified cirrhosis of liver: Secondary | ICD-10-CM | POA: Diagnosis not present

## 2015-10-14 DIAGNOSIS — K754 Autoimmune hepatitis: Secondary | ICD-10-CM | POA: Diagnosis not present

## 2015-11-17 DIAGNOSIS — F319 Bipolar disorder, unspecified: Secondary | ICD-10-CM | POA: Diagnosis not present

## 2015-11-20 ENCOUNTER — Encounter: Payer: Self-pay | Admitting: Internal Medicine

## 2015-11-24 DIAGNOSIS — K7460 Unspecified cirrhosis of liver: Secondary | ICD-10-CM | POA: Diagnosis not present

## 2015-11-24 DIAGNOSIS — K754 Autoimmune hepatitis: Secondary | ICD-10-CM | POA: Diagnosis not present

## 2015-11-24 DIAGNOSIS — Z1211 Encounter for screening for malignant neoplasm of colon: Secondary | ICD-10-CM | POA: Diagnosis not present

## 2015-11-27 DIAGNOSIS — Z23 Encounter for immunization: Secondary | ICD-10-CM | POA: Diagnosis not present

## 2015-12-03 ENCOUNTER — Encounter: Payer: Self-pay | Admitting: Internal Medicine

## 2015-12-03 ENCOUNTER — Ambulatory Visit (INDEPENDENT_AMBULATORY_CARE_PROVIDER_SITE_OTHER): Payer: Medicare Other | Admitting: Internal Medicine

## 2015-12-03 DIAGNOSIS — Z950 Presence of cardiac pacemaker: Secondary | ICD-10-CM | POA: Diagnosis not present

## 2015-12-03 DIAGNOSIS — I495 Sick sinus syndrome: Secondary | ICD-10-CM | POA: Diagnosis not present

## 2015-12-03 DIAGNOSIS — R079 Chest pain, unspecified: Secondary | ICD-10-CM | POA: Diagnosis not present

## 2015-12-03 NOTE — Patient Instructions (Addendum)
Medication Instructions: - Your physician recommends that you continue on your current medications as directed. Please refer to the Current Medication list given to you today.  Labwork: - none ordered  Procedures/Testing: - Your physician has requested that you have a lexiscan myoview. For further information please visit [email or web address]. Please follow instruction sheet, as given.  Follow-Up: - Remote monitoring is used to monitor your Pacemaker of ICD from home. This monitoring reduces the number of office visits required to check your device to one time per year. It allows [place] to keep an eye on the functioning of your device to ensure it is working properly. You are scheduled for a device check from home on 03/03/16. You may send your transmission at any time that day. If you have a wireless device, the transmission will be sent automatically. After your physician reviews your transmission, you will receive a postcard with your next transmission date.  - Your physician wants you to follow-up in: 1 year with Dr. Caryl Comes. You will receive a reminder letter in the mail two months in advance. If you don't receive a letter, please call our office to schedule the follow-up appointment.  Any Additional Special Instructions Will Be Listed Below (If Applicable).     If you need a refill on your cardiac medications before your next appointment, please call your pharmacy. \

## 2015-12-03 NOTE — Progress Notes (Signed)
Patient Care Team: Boykin Nearing, MD as PCP - General (Family Medicine)   HPI  Stacey Velazquez is a 70 y.o. female Seen to establish pacemaker followup. This was implanted 12/14 for sinus arrest and syncope. It was done in [place]. We do not have records. She does not recall any specific cardiac evaluation.   She has no known coronary artery disease.  She has had chest discomfort. It occurs primarily at night. It is associated with brackish taste.  She's also been noted to have rapid heart rates upon walking into the adult daycare center. Her heart rates have been the low 100s. This can be associated with some shortness of breath.  TSH was 1.9  8/16  She's having problems with dizziness. This is primarily recumbent and seated. She's been seen by neurology.  She lives in a group home. She has 2 daughters.   LV EF was normal 7/16      Past Medical History:  Diagnosis Date  . Anxiety   . Autoimmune hepatitis ([place])   . Bipolar 1 disorder ([place])   . GERD (gastroesophageal reflux disease)   . Glaucoma 2003   . Heart disease   . Hepatitis, autoimmune ([place]) 08/25/2014  . Hypotension   . Hypothyroidism   . Pacemaker Medtronic    MRI compatible  . Seizures ([place])    last sz 08/21/14  . Sinus arrest   . Vertigo    bvvp    Past Surgical History:  Procedure Laterality Date  . CATARACT EXTRACTION Left 2008  . KNEE SURGERY Left 1985  . PACEMAKER INSERTION  2014  . PARTIAL HYSTERECTOMY  1979    Current Outpatient Prescriptions  Medication Sig Dispense Refill  . azaTHIOprine (IMURAN) 50 MG tablet Take 50 mg by mouth 3 (three) times daily.    . benztropine (COGENTIN) 1 MG tablet Take 1 mg by mouth 2 (two) times daily.   0  . Cholecalciferol (VITAMIN D) 1000 UNITS capsule Take 1,000 Units by mouth daily.    . furosemide (LASIX) 20 MG tablet take 1 tablet by mouth once daily 90 tablet 2  . ketoprofen (ORUDIS) 75 MG capsule Take 1 capsule (75 mg total) by mouth 3 (three)  times daily as needed (for headache). (Patient taking differently: Take 75 mg by mouth 3 (three) times daily as needed (headache). ) 30 capsule 0  . Lactobacillus (ACIDOPHOLUS PO) Take 1 tablet by mouth daily as needed (lactose intolerance).    Marland Kitchen levETIRAcetam (KEPPRA) 500 MG tablet Take 1 tablet (500 mg total) by mouth 2 (two) times daily. 180 tablet 4  . levothyroxine (SYNTHROID, LEVOTHROID) 100 MCG tablet Take 1 tablet (100 mcg total) by mouth daily before breakfast. 30 tablet 3  . meclizine (ANTIVERT) 12.5 MG tablet Take 1 tablet (12.5 mg total) by mouth 3 (three) times daily as needed for dizziness. 90 tablet 2  . omeprazole (PRILOSEC) 20 MG capsule Take 1 capsule (20 mg total) by mouth daily. Further refills need to be authorized by primary care physician. 30 capsule 0  . potassium chloride SA (K-DUR,KLOR-CON) 20 MEQ tablet Take 1 tablet (20 mEq total) by mouth daily. 30 tablet 3  . predniSONE (DELTASONE) 5 MG tablet Take 0.5 tablets by mouth daily.    Marland Kitchen VRAYLAR 1.5 MG CAPS Take 1 capsule by mouth at bedtime.     No current facility-administered medications for this visit.     Allergies  Allergen Reactions  . Ambien [Zolpidem Tartrate] Other (See  Comments)    Hallucinations   . Amitriptyline Other (See Comments)    Stops heart per pt  . Ampicillin Other (See Comments)    Stops heart per pt   . Anaprox [Naproxen Sodium] Other (See Comments)    Doesn't work per pt  . Benadryl [Diphenhydramine Hcl (Sleep)] Other (See Comments)    Rash  . Cetirizine & Related Other (See Comments)    Swelling   . Cortizone-10 [Hydrocortisone] Other (See Comments)    seizures   . Darvon [Propoxyphene] Other (See Comments)    Increased heart rate per pt   . Diazepam Other (See Comments)    Stops heart per pt  . Diflunisal Swelling and Other (See Comments)    Stops heart per pt (Dolobid)  . Duloxetine Other (See Comments)    Reaction to Cymbalta - pt doesn't remember what the reaction was  .  Flexeril [Cyclobenzaprine] Other (See Comments)    Seizures   . Lactose Intolerance (Gi) Swelling and Other (See Comments)    cramping  . Lidocaine Other (See Comments)    Seizures  . Meperidine And Related Other (See Comments)    Stops heart rate (reaction to Demerol)  . Metanx [L-Methylfolate-Algae-B12-B6] Other (See Comments)    Pt does not remember reaction  . Metoclopramide Other (See Comments)    Pt does not remember reaction  . Morphine And Related Other (See Comments)    Stops heart per pt   . Nuprin [Ibuprofen] Other (See Comments)    Caused headache  . Oxycodone Other (See Comments)    Hallucinations   . Penicillins Other (See Comments)    Stops heart per pt   . Propoxyphene Other (See Comments)    Slowed heart rate per pt (reaction to Darvocet)  . Ranitidine Other (See Comments)    Pt does not remember reaction  . Vicodin [Hydrocodone-Acetaminophen] Other (See Comments)    Seizures   . Tylenol [Acetaminophen] Rash    Review of Systems negative except from HPI and PMH  Physical Exam There were no vitals taken for this visit. Well developed and well nourished in no acute distress HENT normal E scleral and icterus clear Neck Supple JVP flat  carotids brisk and full Clear to ausculation Device pocket well healed; without hematoma or erythema.  There is no tethering Regular rate and rhythm, no murmurs gallops or rub Soft with active bowel sounds No clubbing cyanosis trace Edema Alert and oriented, grossly normal motor and sensory function Skin Warm and Dry  ECG Sinus at 90  16/09/36  Axis -87 Low-voltage     Assessment and  Plan  Sinus node dysfunction  Pacemaker-Medtronic  The patient's device was interrogated.  The information was reviewed. No changes were made in the programming.      Chest pain typical and atypical features -  Low voltage ECG   She is followed closely by GI. There issues about her liver; these records are not available for  our review. Her chest pain in the past is no recumbent and concerning for reflux. It is now more associated with exertion. Her echocardiogram was normal year ago. I suspect is noncardiac but it has multiple typical features. We'll undertake Myoview scanning  I remain concerned about her low voltage. Her echo demonstrates some degree of hypertrophy. I will need to review the old chart to see if there is every fat pad biopsy I do not see at the current medical record

## 2015-12-04 LAB — CUP PACEART INCLINIC DEVICE CHECK
Battery Remaining Longevity: 86 mo
Battery Voltage: 3.01 V
Brady Statistic AP VP Percent: 0.01 %
Brady Statistic AP VS Percent: 2.54 %
Brady Statistic AS VP Percent: 0.07 %
Brady Statistic AS VS Percent: 97.39 %
Brady Statistic RA Percent Paced: 2.54 %
Brady Statistic RV Percent Paced: 0.07 %
Date Time Interrogation Session: 20171108171707
Implantable Lead Implant Date: 20141205
Implantable Lead Implant Date: 20141205
Implantable Lead Location: 753859
Implantable Lead Location: 753860
Implantable Lead Model: 5076
Implantable Lead Model: 5076
Implantable Pulse Generator Implant Date: 20141205
Lead Channel Impedance Value: 361 Ohm
Lead Channel Impedance Value: 399 Ohm
Lead Channel Impedance Value: 513 Ohm
Lead Channel Impedance Value: 551 Ohm
Lead Channel Pacing Threshold Amplitude: 0.375 V
Lead Channel Pacing Threshold Amplitude: 0.875 V
Lead Channel Pacing Threshold Pulse Width: 0.4 ms
Lead Channel Pacing Threshold Pulse Width: 0.4 ms
Lead Channel Sensing Intrinsic Amplitude: 1.875 mV
Lead Channel Sensing Intrinsic Amplitude: 4.125 mV
Lead Channel Sensing Intrinsic Amplitude: 6 mV
Lead Channel Sensing Intrinsic Amplitude: 6.625 mV
Lead Channel Setting Pacing Amplitude: 2 V
Lead Channel Setting Pacing Amplitude: 2.5 V
Lead Channel Setting Pacing Pulse Width: 0.4 ms
Lead Channel Setting Sensing Sensitivity: 0.6 mV

## 2015-12-08 NOTE — Addendum Note (Signed)
Addended by: Bobby Rumpf C on: 12/08/2015 12:54 PM   Modules accepted: Orders

## 2015-12-22 ENCOUNTER — Telehealth: Payer: Self-pay | Admitting: Family Medicine

## 2015-12-22 DIAGNOSIS — E038 Other specified hypothyroidism: Secondary | ICD-10-CM

## 2015-12-22 MED ORDER — LEVOTHYROXINE SODIUM 100 MCG PO TABS: 100.0000 ug | ORAL_TABLET | Freq: Every day | ORAL | 0 refills | Status: DC

## 2015-12-22 NOTE — Telephone Encounter (Signed)
Medication Refill: levothyroxine (SYNTHROID, LEVOTHROID) 100 MCG tablet   [facility] on [address]

## 2015-12-22 NOTE — Telephone Encounter (Signed)
Refilled x 30 days - patient must have office visit for any further refills. 

## 2015-12-29 ENCOUNTER — Telehealth (HOSPITAL_COMMUNITY): Payer: Self-pay | Admitting: *Deleted

## 2015-12-29 NOTE — Telephone Encounter (Signed)
Patient given detailed instructions per Myocardial Perfusion Study Information Sheet for the test on 12/31/15 at 0945. Patient notified to arrive 15 minutes early and that it is imperative to arrive on time for appointment to keep from having the test rescheduled.  If you need to cancel or reschedule your appointment, please call the office within 24 hours of your appointment. Failure to do so may result in a cancellation of your appointment, and a $50 no show fee. Patient verbalized understanding.Romari Gasparro, Ranae [place]

## 2015-12-31 ENCOUNTER — Ambulatory Visit (HOSPITAL_COMMUNITY): Payer: Medicare Other | Attending: Cardiology

## 2015-12-31 DIAGNOSIS — F319 Bipolar disorder, unspecified: Secondary | ICD-10-CM | POA: Insufficient documentation

## 2015-12-31 DIAGNOSIS — R002 Palpitations: Secondary | ICD-10-CM | POA: Diagnosis not present

## 2015-12-31 DIAGNOSIS — R569 Unspecified convulsions: Secondary | ICD-10-CM | POA: Diagnosis not present

## 2015-12-31 DIAGNOSIS — E669 Obesity, unspecified: Secondary | ICD-10-CM | POA: Diagnosis not present

## 2015-12-31 DIAGNOSIS — Z950 Presence of cardiac pacemaker: Secondary | ICD-10-CM | POA: Diagnosis not present

## 2015-12-31 DIAGNOSIS — R000 Tachycardia, unspecified: Secondary | ICD-10-CM | POA: Insufficient documentation

## 2015-12-31 DIAGNOSIS — G2119 Other drug induced secondary parkinsonism: Secondary | ICD-10-CM | POA: Diagnosis not present

## 2015-12-31 DIAGNOSIS — Z6834 Body mass index (BMI) 34.0-34.9, adult: Secondary | ICD-10-CM | POA: Insufficient documentation

## 2015-12-31 DIAGNOSIS — R079 Chest pain, unspecified: Secondary | ICD-10-CM | POA: Diagnosis not present

## 2015-12-31 IMAGING — NM NM MISC PROCEDURE
11 series · 60 of 60 positions shown · non-contrast
Comparison: none

[Series 1: stress-sum-em · 6.40mm/px · 5 of 64 frames shown]
[frame 6/64]
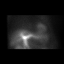
[frame 16/64]
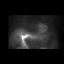
[frame 27/64]
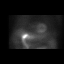
[frame 38/64]
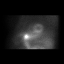
[frame 48/64]
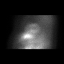

[Series 1: stress-gsp_(id)_sa · 6.4mm · 6.40mm/px · 6 of 512 frames shown]
[frame 43/512]
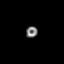
[frame 128/512]
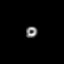
[frame 214/512]
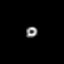
[frame 299/512]
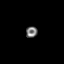
[frame 384/512]
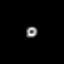
[frame 470/512]
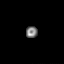

[Series 1: wbr_s-proj_st stress-sum-em · 6.40mm/px · 5 of 64 frames shown]
[frame 6/64]
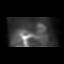
[frame 16/64]
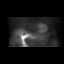
[frame 27/64]
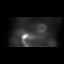
[frame 38/64]
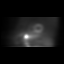
[frame 59/64]
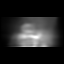

[Series 1: stress-gsp · 6.40mm/px · 6 of 511 frames shown]
[frame 43/511]
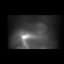
[frame 128/511]
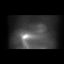
[frame 213/511]
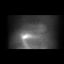
[frame 298/511]
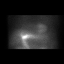
[frame 383/511]
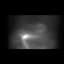
[frame 469/511]
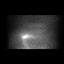

[Series 1: stress-sum-em_(id)_sa · 6.4mm · 6.40mm/px · 5 of 64 frames shown]
[frame 6/64]
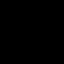
[frame 16/64]
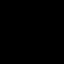
[frame 27/64]
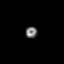
[frame 48/64]
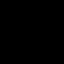
[frame 59/64]
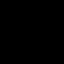

[Series 1: wbr_r-proj_st rest · 6.40mm/px · 6 of 64 frames shown]
[frame 6/64]
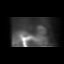
[frame 16/64]
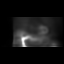
[frame 27/64]
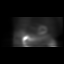
[frame 38/64]
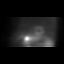
[frame 48/64]
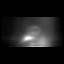
[frame 59/64]
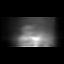

[Series 1: rest-mc_(id)_sa · 6.4mm · 6.40mm/px · 5 of 64 frames shown]
[frame 6/64]
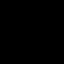
[frame 16/64]
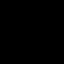
[frame 38/64]
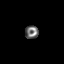
[frame 48/64]
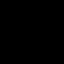
[frame 59/64]
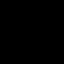

[Series 1: rest-mc · 6.40mm/px · 6 of 64 frames shown]
[frame 6/64]
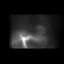
[frame 16/64]
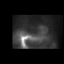
[frame 27/64]
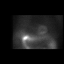
[frame 38/64]
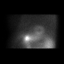
[frame 48/64]
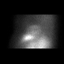
[frame 59/64]
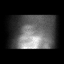

[Series 1: wbr_s-proj_st stress-gsp · 6.40mm/px · 5 of 512 frames shown]
[frame 43/512]
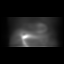
[frame 214/512]
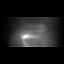
[frame 299/512]
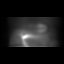
[frame 384/512]
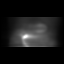
[frame 470/512]
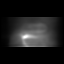

[Series 1: rest · 6.40mm/px · 6 of 64 frames shown]
[frame 6/64]
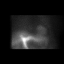
[frame 16/64]
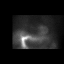
[frame 27/64]
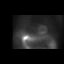
[frame 38/64]
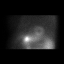
[frame 48/64]
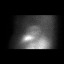
[frame 59/64]
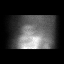

[Series 1: wbr_r-proj_st rest-mc · 6.40mm/px · 5 of 64 frames shown]
[frame 16/64]
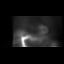
[frame 27/64]
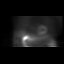
[frame 38/64]
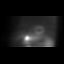
[frame 48/64]
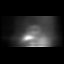
[frame 59/64]
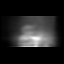

[60 of 60 positions shown; findings below may reference images not displayed]

Canned report from images found in remote index.

Refer to host system for actual result text.

## 2015-12-31 MED ORDER — TECHNETIUM TC 99M TETROFOSMIN IV KIT
33.0000 | PACK | Freq: Once | INTRAVENOUS | Status: AC | PRN
  Administered 2015-12-31: 33 via INTRAVENOUS
  Filled 2015-12-31: qty 33

## 2015-12-31 MED ORDER — REGADENOSON 0.4 MG/5ML IV SOLN
0.4000 mg | Freq: Once | INTRAVENOUS | Status: AC
  Administered 2015-12-31: 0.4 mg via INTRAVENOUS

## 2016-01-01 ENCOUNTER — Ambulatory Visit (HOSPITAL_COMMUNITY): Payer: Medicare Other | Attending: Cardiology

## 2016-01-01 LAB — MYOCARDIAL PERFUSION IMAGING
LV dias vol: 62 mL (ref 46–106)
LV sys vol: 18 mL
Peak HR: 105 {beats}/min
RATE: 0.36
Rest HR: 86 {beats}/min
SDS: 2
SRS: 1
SSS: 3
TID: 1.04

## 2016-01-01 MED ORDER — TECHNETIUM TC 99M TETROFOSMIN IV KIT
32.0000 | PACK | Freq: Once | INTRAVENOUS | Status: AC | PRN
  Administered 2016-01-01: 32 via INTRAVENOUS
  Filled 2016-01-01: qty 32

## 2016-01-12 DIAGNOSIS — K754 Autoimmune hepatitis: Secondary | ICD-10-CM | POA: Diagnosis not present

## 2016-01-12 DIAGNOSIS — K7460 Unspecified cirrhosis of liver: Secondary | ICD-10-CM | POA: Diagnosis not present

## 2016-01-13 ENCOUNTER — Telehealth: Payer: Self-pay | Admitting: Family Medicine

## 2016-01-13 DIAGNOSIS — E038 Other specified hypothyroidism: Secondary | ICD-10-CM

## 2016-01-13 MED ORDER — LEVOTHYROXINE SODIUM 100 MCG PO TABS: 100.0000 ug | ORAL_TABLET | Freq: Every day | ORAL | 0 refills | Status: DC

## 2016-01-13 NOTE — Telephone Encounter (Signed)
Patient has not been seen since August 2016. Will refill x 30 days and then patient MUST have office visit for any further refills.

## 2016-01-13 NOTE — Telephone Encounter (Signed)
Patient is requesting a refill for levothyroxine (SYNTHROID, LEVOTHROID) 100 MCG tablet [[id number]]   Please send prescription to [facility]

## 2016-01-14 ENCOUNTER — Ambulatory Visit (INDEPENDENT_AMBULATORY_CARE_PROVIDER_SITE_OTHER): Payer: Medicare Other | Admitting: Diagnostic Neuroimaging

## 2016-01-14 ENCOUNTER — Encounter: Payer: Self-pay | Admitting: Diagnostic Neuroimaging

## 2016-01-14 VITALS — BP 134/66 | HR 84 | Wt 245.4 lb

## 2016-01-14 DIAGNOSIS — R569 Unspecified convulsions: Secondary | ICD-10-CM | POA: Diagnosis not present

## 2016-01-14 DIAGNOSIS — G2119 Other drug induced secondary parkinsonism: Secondary | ICD-10-CM

## 2016-01-14 DIAGNOSIS — G40909 Epilepsy, unspecified, not intractable, without status epilepticus: Secondary | ICD-10-CM | POA: Diagnosis not present

## 2016-01-14 MED ORDER — LEVETIRACETAM 500 MG PO TABS: 500.0000 mg | ORAL_TABLET | Freq: Two times a day (BID) | ORAL | 4 refills | Status: DC

## 2016-01-14 NOTE — Progress Notes (Signed)
[facility]  PATIENT: Stacey Velazquez DOB: August 04, 1945  REFERRING CLINICIAN: Funches HISTORY FROM: patient  REASON FOR VISIT: follow up   HISTORICAL  CHIEF COMPLAINT:  Chief Complaint  Patient presents with  . Seizures    rm 7, "nuclear stress test today- everything looked ok; pacemaker good, EKG; no seizure activity in past year"  . Follow-up    one year    HISTORY OF PRESENT ILLNESS:   UPDATE 01/14/16: Since last visit, doing well. No new issues. No seizures. Tremors slightly better since stopping risperdal.   UPDATE 01/16/15: Since last visit, doing about the same. No seizures. Tremor stable. Psychiatry tried benztropine, without benefit.   PRIOR HPI (10/14/14): 70 year old right-handed female here for evaluation of seizure. Patient has history of bipolar disorder, autoimmune hepatitis, benign positional vertigo, coronary artery disease, pacemaker, anxiety. Patient has history of seizures since 2000 when she developed episodes with nausea, heavy sensation, loss of consciousness. No shaking. In 2005 she was diagnosed with seizure disorder. In 2006 she was started on levetiracetam 250 mg twice a day. Patient continues abdomen episodes of seizure. Last seizures were around 2013 and 2014. July 2016 patient was admitted to the hospital for episode of nausea, vomiting, syncope. Cardiac workup was done. EEG was unremarkable. Patient was about a by neurology who recommended increasing Keppra dosing. Now she is on 5 mg twice a day. Since discharge patient is doing well. No further episodes of seizure or syncope. Separately patient has noticed some resting tremor in hands and mouth for past 5-6 months. Patient is on Risperdal medication for several years.   REVIEW OF SYSTEMS: Full 14 system review of systems performed and negative except: fatigue leg swelling moles urine decr constipation excess thirst.    ALLERGIES: Allergies  Allergen Reactions  . Ambien [Zolpidem  Tartrate] Other (See Comments)    Hallucinations   . Amitriptyline Other (See Comments)    Stops heart per pt  . Ampicillin Other (See Comments)    Stops heart per pt   . Anaprox [Naproxen Sodium] Other (See Comments)    Doesn't work per pt  . Benadryl [Diphenhydramine Hcl (Sleep)] Other (See Comments)    Rash  . Cetirizine & Related Other (See Comments)    Swelling   . Cortizone-10 [Hydrocortisone] Other (See Comments)    seizures   . Darvon [Propoxyphene] Other (See Comments)    Increased heart rate per pt   . Diazepam Other (See Comments)    Stops heart per pt  . Diflunisal Swelling and Other (See Comments)    Stops heart per pt (Dolobid)  . Duloxetine Other (See Comments)    Reaction to Cymbalta - pt doesn't remember what the reaction was  . Flexeril [Cyclobenzaprine] Other (See Comments)    Seizures   . Lactose Intolerance (Gi) Swelling and Other (See Comments)    cramping  . Lidocaine Other (See Comments)    Seizures  . Meperidine And Related Other (See Comments)    Stops heart rate (reaction to Demerol)  . Metanx [L-Methylfolate-Algae-B12-B6] Other (See Comments)    Pt does not remember reaction  . Metoclopramide Other (See Comments)    Pt does not remember reaction  . Morphine And Related Other (See Comments)    Stops heart per pt   . Nuprin [Ibuprofen] Other (See Comments)    Caused headache  . Oxycodone Other (See Comments)    Hallucinations   . Penicillins Other (See Comments)    Stops heart per pt   .  Propoxyphene Other (See Comments)    Slowed heart rate per pt (reaction to Darvocet)  . Ranitidine Other (See Comments)    Pt does not remember reaction  . Vicodin [Hydrocodone-Acetaminophen] Other (See Comments)    Seizures   . Tylenol [Acetaminophen] Rash    HOME MEDICATIONS: Outpatient Medications Prior to Visit  Medication Sig Dispense Refill  . azaTHIOprine (IMURAN) 50 MG tablet Take 50 mg by mouth 3 (three) times daily.    . benztropine  (COGENTIN) 1 MG tablet Take 1 mg by mouth 2 (two) times daily.   0  . Cholecalciferol (VITAMIN D) 1000 UNITS capsule Take 1,000 Units by mouth daily.    . furosemide (LASIX) 20 MG tablet take 1 tablet by mouth once daily 90 tablet 2  . ketoprofen (ORUDIS) 75 MG capsule Take 1 capsule (75 mg total) by mouth 3 (three) times daily as needed (for headache). (Patient taking differently: Take 75 mg by mouth 3 (three) times daily as needed (headache). ) 30 capsule 0  . Lactobacillus (ACIDOPHOLUS PO) Take 1 tablet by mouth daily as needed (lactose intolerance).    Marland Kitchen levETIRAcetam (KEPPRA) 500 MG tablet Take 1 tablet (500 mg total) by mouth 2 (two) times daily. 180 tablet 4  . levothyroxine (SYNTHROID, LEVOTHROID) 100 MCG tablet Take 1 tablet (100 mcg total) by mouth daily before breakfast. 30 tablet 0  . meclizine (ANTIVERT) 12.5 MG tablet Take 1 tablet (12.5 mg total) by mouth 3 (three) times daily as needed for dizziness. 90 tablet 2  . omeprazole (PRILOSEC) 20 MG capsule Take 1 capsule (20 mg total) by mouth daily. Further refills need to be authorized by primary care physician. 30 capsule 0  . potassium chloride SA (K-DUR,KLOR-CON) 20 MEQ tablet Take 1 tablet (20 mEq total) by mouth daily. 30 tablet 3  . predniSONE (DELTASONE) 5 MG tablet Take 0.5 tablets by mouth daily.    Marland Kitchen VRAYLAR 1.5 MG CAPS Take 1 capsule by mouth at bedtime.     No facility-administered medications prior to visit.     PAST MEDICAL HISTORY: Past Medical History:  Diagnosis Date  . Anxiety   . Autoimmune hepatitis ([place])   . Bipolar 1 disorder ([place])   . GERD (gastroesophageal reflux disease)   . Glaucoma 2003   . Heart disease   . Hepatitis, autoimmune ([place]) 08/25/2014  . Hypotension   . Hypothyroidism   . Pacemaker Medtronic    MRI compatible  . Seizures ([place])    last sz 08/21/14  . Sinus arrest   . Vertigo    bvvp    PAST SURGICAL HISTORY: Past Surgical History:  Procedure Laterality Date  . CATARACT EXTRACTION  Left 2008  . KNEE SURGERY Left 1985  . PACEMAKER INSERTION  2014  . PARTIAL HYSTERECTOMY  1979    FAMILY HISTORY: Family History  Problem Relation Age of Onset  . Heart attack Father   . Heart disease Paternal Uncle   . Heart disease Paternal Grandmother   . Diabetes Paternal Grandmother   . Heart disease Paternal Grandfather   . Cancer Mother     BREAST  . Cancer Maternal Grandmother   . CVA Maternal Grandmother   . Cancer Maternal Grandfather   . CVA Maternal Grandfather   . Diabetes Sister   . Diabetes Brother   . Diabetes Brother   . Stroke Daughter     SOCIAL HISTORY:  Social History   Social History  . Marital status: Married    Spouse name:  N/A  . Number of children: N/A  . Years of education: N/A   Occupational History  . Not on file.   Social History Main Topics  . Smoking status: Former Smoker    Quit date: 03/02/2003  . Smokeless tobacco: Never Used  . Alcohol use No  . Drug use: No  . Sexual activity: Not on file   Other Topics Concern  . Not on file   Social History Narrative   Lives alone, "have friends and relatives that come and help me"   caffeine use- coffee -1 cup daily     PHYSICAL EXAM  GENERAL EXAM/CONSTITUTIONAL: Vitals:  Vitals:   01/14/16 1335  BP: 134/66  Pulse: 84  Weight: 245 lb 6.4 oz (111.3 kg)   Body mass index is 36.24 kg/m. No exam data present  Patient is in no distress; well developed, nourished and groomed; neck is supple  CARDIOVASCULAR:  Examination of carotid arteries is normal; no carotid bruits  Regular rate and rhythm, no murmurs  Examination of peripheral vascular system by observation and palpation is normal  EYES:  Ophthalmoscopic exam of optic discs and posterior segments is normal; no papilledema or hemorrhages  MUSCULOSKELETAL:  Gait, strength, tone, movements noted in Neurologic exam below  NEUROLOGIC: MENTAL STATUS:  No flowsheet data found.  awake, alert, oriented to person,  place and time  recent and remote memory intact  normal attention and concentration  language fluent, comprehension intact, naming intact,   fund of knowledge appropriate  CRANIAL NERVE:   2nd - no papilledema on fundoscopic exam  2nd, 3rd, 4th, 6th - pupils equal and reactive to light, visual fields full to confrontation, extraocular muscles intact, no nystagmus  5th - facial sensation symmetric  7th - facial strength --> SLIGHT DECR RIGHT NL FOLD; SLIGHT LOWER RIGHT EYELID THAN LEFT  8th - hearing intact  9th - palate elevates symmetrically, uvula midline  11th - shoulder shrug symmetric  12th - tongue protrusion midline  MASKED FACIES  MILD MOUTH TREMOR  MOTOR:   normal bulk; REST TREMOR IN LEFT > RIGHT HAND; MILD COGWHEELING IN BUE; MOD BRADYKINESIA IN BUE AND BLE; full strength in the BUE, BLE  SENSORY:   normal and symmetric to light touch, temperature, vibration   COORDINATION:   finger-nose-finger, fine finger movements normal  REFLEXES:   deep tendon reflexes TRACE and symmetric  GAIT/STATION:   narrow based gait; DECR ARM SWING, WITH HAND TREMORS    DIAGNOSTIC DATA (LABS, IMAGING, TESTING) - I reviewed patient records, labs, notes, testing and imaging myself where available.  Lab Results  Component Value Date   WBC 2.4 (L) 09/25/2014   HGB 14.1 09/25/2014   HCT 41.2 09/25/2014   MCV 92.0 09/25/2014   PLT 95 (L) 09/25/2014      Component Value Date/Time   NA 135 09/25/2014 1403   K 4.2 09/25/2014 1403   CL 102 09/25/2014 1403   CO2 25 09/25/2014 1403   GLUCOSE 86 09/25/2014 1403   BUN 10 09/25/2014 1403   CREATININE 0.83 09/25/2014 1403   CALCIUM 8.6 02/24/2015 1510   PROT 8.0 09/25/2014 1403   ALBUMIN 3.4 02/24/2015 1510   AST 25 02/24/2015 1510   ALT 29 02/24/2015 1510   ALKPHOS 99 09/25/2014 1403   BILITOT 0.5 09/25/2014 1403   GFRNONAA 81 02/24/2015 1510   GFRNONAA 72 09/25/2014 1403   GFRAA 83 09/25/2014 1403   No  results found for: CHOL, HDL, LDLCALC, LDLDIRECT, TRIG, CHOLHDL No  results found for: HGBA1C No results found for: VITAMINB12 Lab Results  Component Value Date   TSH 1.975 09/09/2014     08/21/14 CT head [I reviewed images myself and agree with interpretation. -VRP]  1. No acute intracranial pathology. 2. Chronic microvascular disease and cerebral atrophy.  08/22/14 EEG  - This is a normal awake electroencephalogram. No epileptiform activity is noted.  - Comment: An EEG with the patient sleep deprived to elicit drowse and light sleep may be desirable to further elicit a possible seizure disorder.      ASSESSMENT AND PLAN  70 y.o. year old female here with seizure disorder since year 2000. Also with an episode of syncope which may have been a seizure as well. Now doing well on levetiracetam 500 mg twice a day.   Also noted is drug-induced parkinsonism symptoms, likely related to risperdal, and slightly improved with stopping risperdal.    Dx:  Seizure disorder ([place])  Drug-induced Parkinson's disease ([place])    PLAN: - continue LEV 500mg  BID - may consider DATscan to differentiate drug-induced parkinsonism vs parkinson's disease  Meds ordered this encounter  Medications  . levETIRAcetam (KEPPRA) 500 MG tablet    Sig: Take 1 tablet (500 mg total) by mouth 2 (two) times daily.    Dispense:  180 tablet    Refill:  4   Return in about 13 months (around 02/13/2017).    Penni Bombard, MD 77/37/3668, 1:59 PM Certified in Neurology, Neurophysiology and Neuroimaging  [facility] [address], [place], [place] [address] [phone]

## 2016-01-21 ENCOUNTER — Ambulatory Visit: Payer: Medicare Other | Admitting: Diagnostic Neuroimaging

## 2016-02-05 DIAGNOSIS — F319 Bipolar disorder, unspecified: Secondary | ICD-10-CM | POA: Diagnosis not present

## 2016-02-10 DIAGNOSIS — Z1211 Encounter for screening for malignant neoplasm of colon: Secondary | ICD-10-CM | POA: Diagnosis not present

## 2016-02-10 DIAGNOSIS — K7460 Unspecified cirrhosis of liver: Secondary | ICD-10-CM | POA: Diagnosis not present

## 2016-02-10 DIAGNOSIS — I8510 Secondary esophageal varices without bleeding: Secondary | ICD-10-CM | POA: Diagnosis not present

## 2016-02-10 DIAGNOSIS — K7469 Other cirrhosis of liver: Secondary | ICD-10-CM | POA: Diagnosis not present

## 2016-02-10 DIAGNOSIS — D124 Benign neoplasm of descending colon: Secondary | ICD-10-CM | POA: Diagnosis not present

## 2016-02-10 DIAGNOSIS — D123 Benign neoplasm of transverse colon: Secondary | ICD-10-CM | POA: Diagnosis not present

## 2016-02-10 DIAGNOSIS — I8500 Esophageal varices without bleeding: Secondary | ICD-10-CM | POA: Diagnosis not present

## 2016-02-10 DIAGNOSIS — K635 Polyp of colon: Secondary | ICD-10-CM | POA: Diagnosis not present

## 2016-02-10 DIAGNOSIS — D122 Benign neoplasm of ascending colon: Secondary | ICD-10-CM | POA: Diagnosis not present

## 2016-02-12 ENCOUNTER — Ambulatory Visit: Payer: Medicare Other | Admitting: Family Medicine

## 2016-02-23 ENCOUNTER — Ambulatory Visit: Payer: Medicare Other | Admitting: Family Medicine

## 2016-02-25 ENCOUNTER — Other Ambulatory Visit: Payer: Self-pay | Admitting: Family Medicine

## 2016-02-25 DIAGNOSIS — E038 Other specified hypothyroidism: Secondary | ICD-10-CM

## 2016-02-26 DIAGNOSIS — L905 Scar conditions and fibrosis of skin: Secondary | ICD-10-CM | POA: Diagnosis not present

## 2016-02-26 DIAGNOSIS — L820 Inflamed seborrheic keratosis: Secondary | ICD-10-CM | POA: Diagnosis not present

## 2016-02-26 DIAGNOSIS — L821 Other seborrheic keratosis: Secondary | ICD-10-CM | POA: Diagnosis not present

## 2016-03-01 ENCOUNTER — Ambulatory Visit: Payer: Medicare Other | Attending: Family Medicine | Admitting: Family Medicine

## 2016-03-01 ENCOUNTER — Encounter: Payer: Self-pay | Admitting: Family Medicine

## 2016-03-01 VITALS — BP 135/91 | HR 115 | Temp 98.4°F | Ht 69.0 in | Wt 244.2 lb

## 2016-03-01 DIAGNOSIS — Z760 Encounter for issue of repeat prescription: Secondary | ICD-10-CM | POA: Insufficient documentation

## 2016-03-01 DIAGNOSIS — Z87891 Personal history of nicotine dependence: Secondary | ICD-10-CM | POA: Insufficient documentation

## 2016-03-01 DIAGNOSIS — Z780 Asymptomatic menopausal state: Secondary | ICD-10-CM | POA: Diagnosis not present

## 2016-03-01 DIAGNOSIS — G40909 Epilepsy, unspecified, not intractable, without status epilepticus: Secondary | ICD-10-CM | POA: Insufficient documentation

## 2016-03-01 DIAGNOSIS — G20 Parkinson's disease: Secondary | ICD-10-CM | POA: Diagnosis not present

## 2016-03-01 DIAGNOSIS — K754 Autoimmune hepatitis: Secondary | ICD-10-CM | POA: Insufficient documentation

## 2016-03-01 DIAGNOSIS — F319 Bipolar disorder, unspecified: Secondary | ICD-10-CM | POA: Diagnosis not present

## 2016-03-01 DIAGNOSIS — E2839 Other primary ovarian failure: Secondary | ICD-10-CM

## 2016-03-01 DIAGNOSIS — E039 Hypothyroidism, unspecified: Secondary | ICD-10-CM | POA: Diagnosis not present

## 2016-03-01 DIAGNOSIS — Z1231 Encounter for screening mammogram for malignant neoplasm of breast: Secondary | ICD-10-CM

## 2016-03-01 LAB — TSH: TSH: 1.2 mIU/L

## 2016-03-01 MED ORDER — LEVOTHYROXINE SODIUM 100 MCG PO TABS: 100.0000 ug | ORAL_TABLET | Freq: Every day | ORAL | 5 refills | Status: DC

## 2016-03-01 NOTE — Progress Notes (Signed)
Subjective:  Patient ID: Stacey Velazquez, female    DOB: January 08, 1946  Age: 71 y.o. MRN: [id number]  CC: Medication Refill   HPI Stacey Velazquez has autoimmune hepatitis ( followed q 3 months by GI, Dr. Benson Norway), bipolar disorder ( followed by Mental Health), seizure disorder and Risperdal induced Parkinson ( followed by Neurology) she presents for   1. Hypothyroidism: she has been out synthroid. She took her last dose yesterday. She has intermittent palpitations and dizziness. She has resting tremor. She denies depression, anxiety, weight change, trouble sleeping.   Social History  Substance Use Topics  . Smoking status: Former Smoker    Quit date: 03/02/2003  . Smokeless tobacco: Never Used  . Alcohol use No    Outpatient Medications Prior to Visit  Medication Sig Dispense Refill  . azaTHIOprine (IMURAN) 50 MG tablet Take 50 mg by mouth 3 (three) times daily.    . benztropine (COGENTIN) 1 MG tablet Take 1 mg by mouth 2 (two) times daily.   0  . Cholecalciferol (VITAMIN D) 1000 UNITS capsule Take 1,000 Units by mouth daily.    . furosemide (LASIX) 20 MG tablet take 1 tablet by mouth once daily 90 tablet 2  . ketoprofen (ORUDIS) 75 MG capsule Take 1 capsule (75 mg total) by mouth 3 (three) times daily as needed (for headache). (Patient taking differently: Take 75 mg by mouth 3 (three) times daily as needed (headache). ) 30 capsule 0  . Lactobacillus (ACIDOPHOLUS PO) Take 1 tablet by mouth daily as needed (lactose intolerance).    Marland Kitchen levETIRAcetam (KEPPRA) 500 MG tablet Take 1 tablet (500 mg total) by mouth 2 (two) times daily. 180 tablet 4  . levothyroxine (SYNTHROID, LEVOTHROID) 100 MCG tablet Take 1 tablet (100 mcg total) by mouth daily before breakfast. 30 tablet 0  . meclizine (ANTIVERT) 12.5 MG tablet Take 1 tablet (12.5 mg total) by mouth 3 (three) times daily as needed for dizziness. 90 tablet 2  . potassium chloride SA (K-DUR,KLOR-CON) 20 MEQ tablet Take 1 tablet (20 mEq total) by mouth  daily. 30 tablet 3  . predniSONE (DELTASONE) 5 MG tablet Take 0.5 tablets by mouth daily.    Marland Kitchen VRAYLAR 1.5 MG CAPS Take 1 capsule by mouth at bedtime.    Marland Kitchen omeprazole (PRILOSEC) 20 MG capsule Take 1 capsule (20 mg total) by mouth daily. Further refills need to be authorized by primary care physician. (Patient not taking: Reported on 03/01/2016) 30 capsule 0   No facility-administered medications prior to visit.     ROS Review of Systems  Constitutional: Negative for chills and fever.  Eyes: Negative for visual disturbance.  Respiratory: Negative for shortness of breath.   Cardiovascular: Positive for palpitations. Negative for chest pain.  Gastrointestinal: Negative for abdominal pain and blood in stool.  Musculoskeletal: Negative for arthralgias and back pain.  Skin: Negative for rash.  Allergic/Immunologic: Negative for immunocompromised state.  Neurological: Positive for dizziness and tremors.  Hematological: Negative for adenopathy. Does not bruise/bleed easily.  Psychiatric/Behavioral: Negative for dysphoric mood and suicidal ideas.    Objective:  BP (!) 135/91 (BP Location: Right Arm, Patient Position: Sitting, Cuff Size: Large)   Pulse (!) 115   Temp 98.4 F (36.9 C) (Oral)   Ht 5\' 9"  (1.753 m)   Wt 244 lb 3.2 oz (110.8 kg)   SpO2 95%   BMI 36.06 kg/m   BP/Weight 03/01/2016 01/14/2016 5/85/2778  Systolic BP 242 353 614  Diastolic BP 91 66 60  Wt. (Lbs) 244.2 245.4 178  BMI 36.06 36.24 26.27   Pulse Readings from Last 3 Encounters:  03/01/16 (!) 115  01/14/16 84  06/24/15 100    Physical Exam  Constitutional: She is oriented to person, place, and time. She appears well-developed and well-nourished. No distress.  HENT:  Head: Normocephalic and atraumatic.  Neck: No thyromegaly present.  Cardiovascular: Regular rhythm, normal heart sounds and intact distal pulses.  Tachycardia present.   Pulmonary/Chest: Effort normal and breath sounds normal.  Musculoskeletal:  She exhibits no edema.  Neurological: She is alert and oriented to person, place, and time. She displays tremor (resting tremor ).  Skin: Skin is warm and dry. No rash noted.  Psychiatric: She has a normal mood and affect.    Lab Results  Component Value Date   TSH 1.975 09/09/2014    Assessment & Plan:   Chesnie was seen today for medication refill.  Diagnoses and all orders for this visit:  Hypothyroidism, unspecified type -     TSH -     levothyroxine (SYNTHROID, LEVOTHROID) 100 MCG tablet; Take 1 tablet (100 mcg total) by mouth daily before breakfast.  Estrogen deficiency -     DG Bone Density; Future  Visit for screening mammogram -     MM DIGITAL SCREENING BILATERAL; Future   No orders of the defined types were placed in this encounter.   Follow-up: Return in about 6 months (around 08/29/2016) for hypothyroidism .   Boykin Nearing MD

## 2016-03-01 NOTE — Assessment & Plan Note (Signed)
Hypothyroidism Compliant with synthroid No dose this AM  Plan: Check TSH Refilled synthroid

## 2016-03-01 NOTE — Patient Instructions (Addendum)
Ashleyann was seen today for medication refill.  Diagnoses and all orders for this visit:  Hypothyroidism, unspecified type -     TSH -     levothyroxine (SYNTHROID, LEVOTHROID) 100 MCG tablet; Take 1 tablet (100 mcg total) by mouth daily before breakfast.  Estrogen deficiency -     DG Bone Density; Future  Visit for screening mammogram -     MM DIGITAL SCREENING BILATERAL; Future  call to schedule your mammogram and bone density scan at your earliest convenience  F/u in 6 months for hypothyroidism Dr.  Adrian Blackwater

## 2016-03-03 ENCOUNTER — Telehealth: Payer: Self-pay | Admitting: Cardiology

## 2016-03-03 ENCOUNTER — Telehealth: Payer: Self-pay

## 2016-03-03 ENCOUNTER — Ambulatory Visit (INDEPENDENT_AMBULATORY_CARE_PROVIDER_SITE_OTHER): Payer: Medicare Other | Admitting: *Deleted

## 2016-03-03 DIAGNOSIS — I495 Sick sinus syndrome: Secondary | ICD-10-CM | POA: Diagnosis not present

## 2016-03-03 NOTE — Progress Notes (Signed)
Remote pacemaker transmission.   

## 2016-03-03 NOTE — Telephone Encounter (Signed)
Spoke with pt and reminded pt of remote transmission that is due today. Pt verbalized understanding.   

## 2016-03-03 NOTE — Telephone Encounter (Signed)
Pt was called and informed of lab results. 

## 2016-03-05 ENCOUNTER — Encounter: Payer: Self-pay | Admitting: Cardiology

## 2016-03-05 LAB — CUP PACEART REMOTE DEVICE CHECK
Battery Remaining Longevity: 81 mo
Battery Voltage: 3.01 V
Brady Statistic AP VP Percent: 0.01 %
Brady Statistic AP VS Percent: 4.21 %
Brady Statistic AS VP Percent: 0.07 %
Brady Statistic AS VS Percent: 95.71 %
Brady Statistic RA Percent Paced: 4.22 %
Brady Statistic RV Percent Paced: 0.08 %
Date Time Interrogation Session: 20180207175022
Implantable Lead Implant Date: 20141205
Implantable Lead Implant Date: 20141205
Implantable Lead Location: 753859
Implantable Lead Location: 753860
Implantable Lead Model: 5076
Implantable Lead Model: 5076
Implantable Pulse Generator Implant Date: 20141205
Lead Channel Impedance Value: 361 Ohm
Lead Channel Impedance Value: 399 Ohm
Lead Channel Impedance Value: 494 Ohm
Lead Channel Impedance Value: 513 Ohm
Lead Channel Pacing Threshold Amplitude: 0.375 V
Lead Channel Pacing Threshold Amplitude: 0.75 V
Lead Channel Pacing Threshold Pulse Width: 0.4 ms
Lead Channel Pacing Threshold Pulse Width: 0.4 ms
Lead Channel Sensing Intrinsic Amplitude: 2.125 mV
Lead Channel Sensing Intrinsic Amplitude: 2.125 mV
Lead Channel Sensing Intrinsic Amplitude: 4.125 mV
Lead Channel Sensing Intrinsic Amplitude: 4.125 mV
Lead Channel Setting Pacing Amplitude: 2 V
Lead Channel Setting Pacing Amplitude: 2.5 V
Lead Channel Setting Pacing Pulse Width: 0.4 ms
Lead Channel Setting Sensing Sensitivity: 0.6 mV

## 2016-03-15 DIAGNOSIS — D126 Benign neoplasm of colon, unspecified: Secondary | ICD-10-CM | POA: Diagnosis not present

## 2016-04-14 ENCOUNTER — Other Ambulatory Visit: Payer: Self-pay | Admitting: Family Medicine

## 2016-04-14 DIAGNOSIS — Z1231 Encounter for screening mammogram for malignant neoplasm of breast: Secondary | ICD-10-CM

## 2016-04-14 DIAGNOSIS — K635 Polyp of colon: Secondary | ICD-10-CM | POA: Diagnosis not present

## 2016-04-27 DIAGNOSIS — F319 Bipolar disorder, unspecified: Secondary | ICD-10-CM | POA: Diagnosis not present

## 2016-05-04 ENCOUNTER — Ambulatory Visit
Admission: RE | Admit: 2016-05-04 | Discharge: 2016-05-04 | Disposition: A | Discharge status: Home / Self Care | Payer: Medicare Other | Source: Ambulatory Visit | Attending: Family Medicine | Admitting: Family Medicine

## 2016-05-04 DIAGNOSIS — Z1231 Encounter for screening mammogram for malignant neoplasm of breast: Secondary | ICD-10-CM

## 2016-05-26 DIAGNOSIS — K219 Gastro-esophageal reflux disease without esophagitis: Secondary | ICD-10-CM | POA: Diagnosis not present

## 2016-05-26 DIAGNOSIS — K754 Autoimmune hepatitis: Secondary | ICD-10-CM | POA: Diagnosis not present

## 2016-05-26 DIAGNOSIS — K635 Polyp of colon: Secondary | ICD-10-CM | POA: Diagnosis not present

## 2016-05-26 DIAGNOSIS — I252 Old myocardial infarction: Secondary | ICD-10-CM | POA: Diagnosis not present

## 2016-05-26 DIAGNOSIS — Z950 Presence of cardiac pacemaker: Secondary | ICD-10-CM | POA: Diagnosis not present

## 2016-05-26 DIAGNOSIS — E039 Hypothyroidism, unspecified: Secondary | ICD-10-CM | POA: Diagnosis not present

## 2016-05-26 DIAGNOSIS — Z8673 Personal history of transient ischemic attack (TIA), and cerebral infarction without residual deficits: Secondary | ICD-10-CM | POA: Diagnosis not present

## 2016-05-26 DIAGNOSIS — Z79899 Other long term (current) drug therapy: Secondary | ICD-10-CM | POA: Diagnosis not present

## 2016-05-26 DIAGNOSIS — D122 Benign neoplasm of ascending colon: Secondary | ICD-10-CM | POA: Diagnosis not present

## 2016-05-26 DIAGNOSIS — I2510 Atherosclerotic heart disease of native coronary artery without angina pectoris: Secondary | ICD-10-CM | POA: Diagnosis not present

## 2016-05-26 DIAGNOSIS — Z6836 Body mass index (BMI) 36.0-36.9, adult: Secondary | ICD-10-CM | POA: Diagnosis not present

## 2016-05-26 DIAGNOSIS — I495 Sick sinus syndrome: Secondary | ICD-10-CM | POA: Diagnosis not present

## 2016-05-26 DIAGNOSIS — D123 Benign neoplasm of transverse colon: Secondary | ICD-10-CM | POA: Diagnosis not present

## 2016-05-26 DIAGNOSIS — Z87891 Personal history of nicotine dependence: Secondary | ICD-10-CM | POA: Diagnosis not present

## 2016-05-26 DIAGNOSIS — F319 Bipolar disorder, unspecified: Secondary | ICD-10-CM | POA: Diagnosis not present

## 2016-06-02 ENCOUNTER — Telehealth: Payer: Self-pay | Admitting: Cardiology

## 2016-06-02 ENCOUNTER — Telehealth: Payer: Self-pay | Admitting: Internal Medicine

## 2016-06-02 ENCOUNTER — Ambulatory Visit (INDEPENDENT_AMBULATORY_CARE_PROVIDER_SITE_OTHER): Payer: Medicare Other | Admitting: *Deleted

## 2016-06-02 DIAGNOSIS — I495 Sick sinus syndrome: Secondary | ICD-10-CM | POA: Diagnosis not present

## 2016-06-02 NOTE — Telephone Encounter (Signed)
New message   Pt is calling asking if her transmission was received.

## 2016-06-02 NOTE — Telephone Encounter (Signed)
Spoke with pt and reminded pt of remote transmission that is due today. Pt verbalized understanding.   

## 2016-06-02 NOTE — Telephone Encounter (Signed)
Instructed patient to move the monitor for increased cell signal (near window) and re-send transmission. She verbalizes understanding.

## 2016-06-02 NOTE — Progress Notes (Signed)
Remote pacemaker transmission.   

## 2016-06-04 ENCOUNTER — Encounter: Payer: Self-pay | Admitting: Cardiology

## 2016-06-04 LAB — CUP PACEART REMOTE DEVICE CHECK
Battery Remaining Longevity: 80 mo
Battery Voltage: 3.01 V
Brady Statistic AP VP Percent: 0.01 %
Brady Statistic AP VS Percent: 2.8 %
Brady Statistic AS VP Percent: 0.07 %
Brady Statistic AS VS Percent: 97.11 %
Brady Statistic RA Percent Paced: 2.81 %
Brady Statistic RV Percent Paced: 0.09 %
Date Time Interrogation Session: 20180509184446
Implantable Lead Implant Date: 20141205
Implantable Lead Implant Date: 20141205
Implantable Lead Location: 753859
Implantable Lead Location: 753860
Implantable Lead Model: 5076
Implantable Lead Model: 5076
Implantable Pulse Generator Implant Date: 20141205
Lead Channel Impedance Value: 342 Ohm
Lead Channel Impedance Value: 380 Ohm
Lead Channel Impedance Value: 437 Ohm
Lead Channel Impedance Value: 437 Ohm
Lead Channel Pacing Threshold Amplitude: 0.375 V
Lead Channel Pacing Threshold Amplitude: 1 V
Lead Channel Pacing Threshold Pulse Width: 0.4 ms
Lead Channel Pacing Threshold Pulse Width: 0.4 ms
Lead Channel Sensing Intrinsic Amplitude: 1.625 mV
Lead Channel Sensing Intrinsic Amplitude: 1.625 mV
Lead Channel Sensing Intrinsic Amplitude: 6.625 mV
Lead Channel Sensing Intrinsic Amplitude: 6.625 mV
Lead Channel Setting Pacing Amplitude: 2 V
Lead Channel Setting Pacing Amplitude: 2.5 V
Lead Channel Setting Pacing Pulse Width: 0.4 ms
Lead Channel Setting Sensing Sensitivity: 0.6 mV

## 2016-06-07 DIAGNOSIS — K7460 Unspecified cirrhosis of liver: Secondary | ICD-10-CM | POA: Diagnosis not present

## 2016-06-07 DIAGNOSIS — K754 Autoimmune hepatitis: Secondary | ICD-10-CM | POA: Diagnosis not present

## 2016-06-07 DIAGNOSIS — C4441 Basal cell carcinoma of skin of scalp and neck: Secondary | ICD-10-CM | POA: Diagnosis not present

## 2016-06-07 DIAGNOSIS — K5900 Constipation, unspecified: Secondary | ICD-10-CM | POA: Diagnosis not present

## 2016-06-09 ENCOUNTER — Encounter: Payer: Self-pay | Admitting: Family Medicine

## 2016-06-18 ENCOUNTER — Other Ambulatory Visit: Payer: Self-pay | Admitting: Internal Medicine

## 2016-07-05 DIAGNOSIS — R112 Nausea with vomiting, unspecified: Secondary | ICD-10-CM | POA: Diagnosis not present

## 2016-07-05 DIAGNOSIS — M940 Chondrocostal junction syndrome [Tietze]: Secondary | ICD-10-CM | POA: Diagnosis not present

## 2016-07-05 DIAGNOSIS — K5900 Constipation, unspecified: Secondary | ICD-10-CM | POA: Diagnosis not present

## 2016-07-15 DIAGNOSIS — F319 Bipolar disorder, unspecified: Secondary | ICD-10-CM | POA: Diagnosis not present

## 2016-08-02 DIAGNOSIS — K754 Autoimmune hepatitis: Secondary | ICD-10-CM | POA: Diagnosis not present

## 2016-08-02 DIAGNOSIS — K5900 Constipation, unspecified: Secondary | ICD-10-CM | POA: Diagnosis not present

## 2016-08-26 ENCOUNTER — Ambulatory Visit: Payer: Medicare Other | Attending: Family Medicine | Admitting: Family Medicine

## 2016-08-26 ENCOUNTER — Other Ambulatory Visit: Payer: Self-pay | Admitting: Family Medicine

## 2016-08-26 ENCOUNTER — Encounter: Payer: Self-pay | Admitting: Family Medicine

## 2016-08-26 ENCOUNTER — Ambulatory Visit
Admission: RE | Admit: 2016-08-26 | Discharge: 2016-08-26 | Disposition: A | Discharge status: Home / Self Care | Payer: Medicare Other | Source: Ambulatory Visit | Attending: Family Medicine | Admitting: Family Medicine

## 2016-08-26 VITALS — BP 106/74 | HR 96 | Temp 97.5°F | Ht 69.0 in | Wt 245.8 lb

## 2016-08-26 DIAGNOSIS — K754 Autoimmune hepatitis: Secondary | ICD-10-CM | POA: Insufficient documentation

## 2016-08-26 DIAGNOSIS — Z79899 Other long term (current) drug therapy: Secondary | ICD-10-CM | POA: Insufficient documentation

## 2016-08-26 DIAGNOSIS — M25551 Pain in right hip: Secondary | ICD-10-CM | POA: Insufficient documentation

## 2016-08-26 DIAGNOSIS — Z87891 Personal history of nicotine dependence: Secondary | ICD-10-CM | POA: Diagnosis not present

## 2016-08-26 DIAGNOSIS — K05219 Aggressive periodontitis, localized, unspecified severity: Secondary | ICD-10-CM

## 2016-08-26 DIAGNOSIS — M545 Low back pain: Secondary | ICD-10-CM | POA: Diagnosis not present

## 2016-08-26 DIAGNOSIS — F319 Bipolar disorder, unspecified: Secondary | ICD-10-CM | POA: Diagnosis not present

## 2016-08-26 DIAGNOSIS — G40909 Epilepsy, unspecified, not intractable, without status epilepticus: Secondary | ICD-10-CM | POA: Diagnosis not present

## 2016-08-26 DIAGNOSIS — M47816 Spondylosis without myelopathy or radiculopathy, lumbar region: Secondary | ICD-10-CM | POA: Diagnosis not present

## 2016-08-26 DIAGNOSIS — M25561 Pain in right knee: Secondary | ICD-10-CM | POA: Diagnosis not present

## 2016-08-26 HISTORY — DX: Aggressive periodontitis, localized, unspecified severity: K05.219

## 2016-08-26 IMAGING — DX DG KNEE 1-2V*R*
2 series · 2 of 2 positions shown · non-contrast
Comparison: None.

CLINICAL DATA: Right knee pain for 2 weeks without known injury.

EXAM:
RIGHT KNEE - 1-2 VIEW

[dg knee ap/lat w/ sunrise right (1 of 2)]
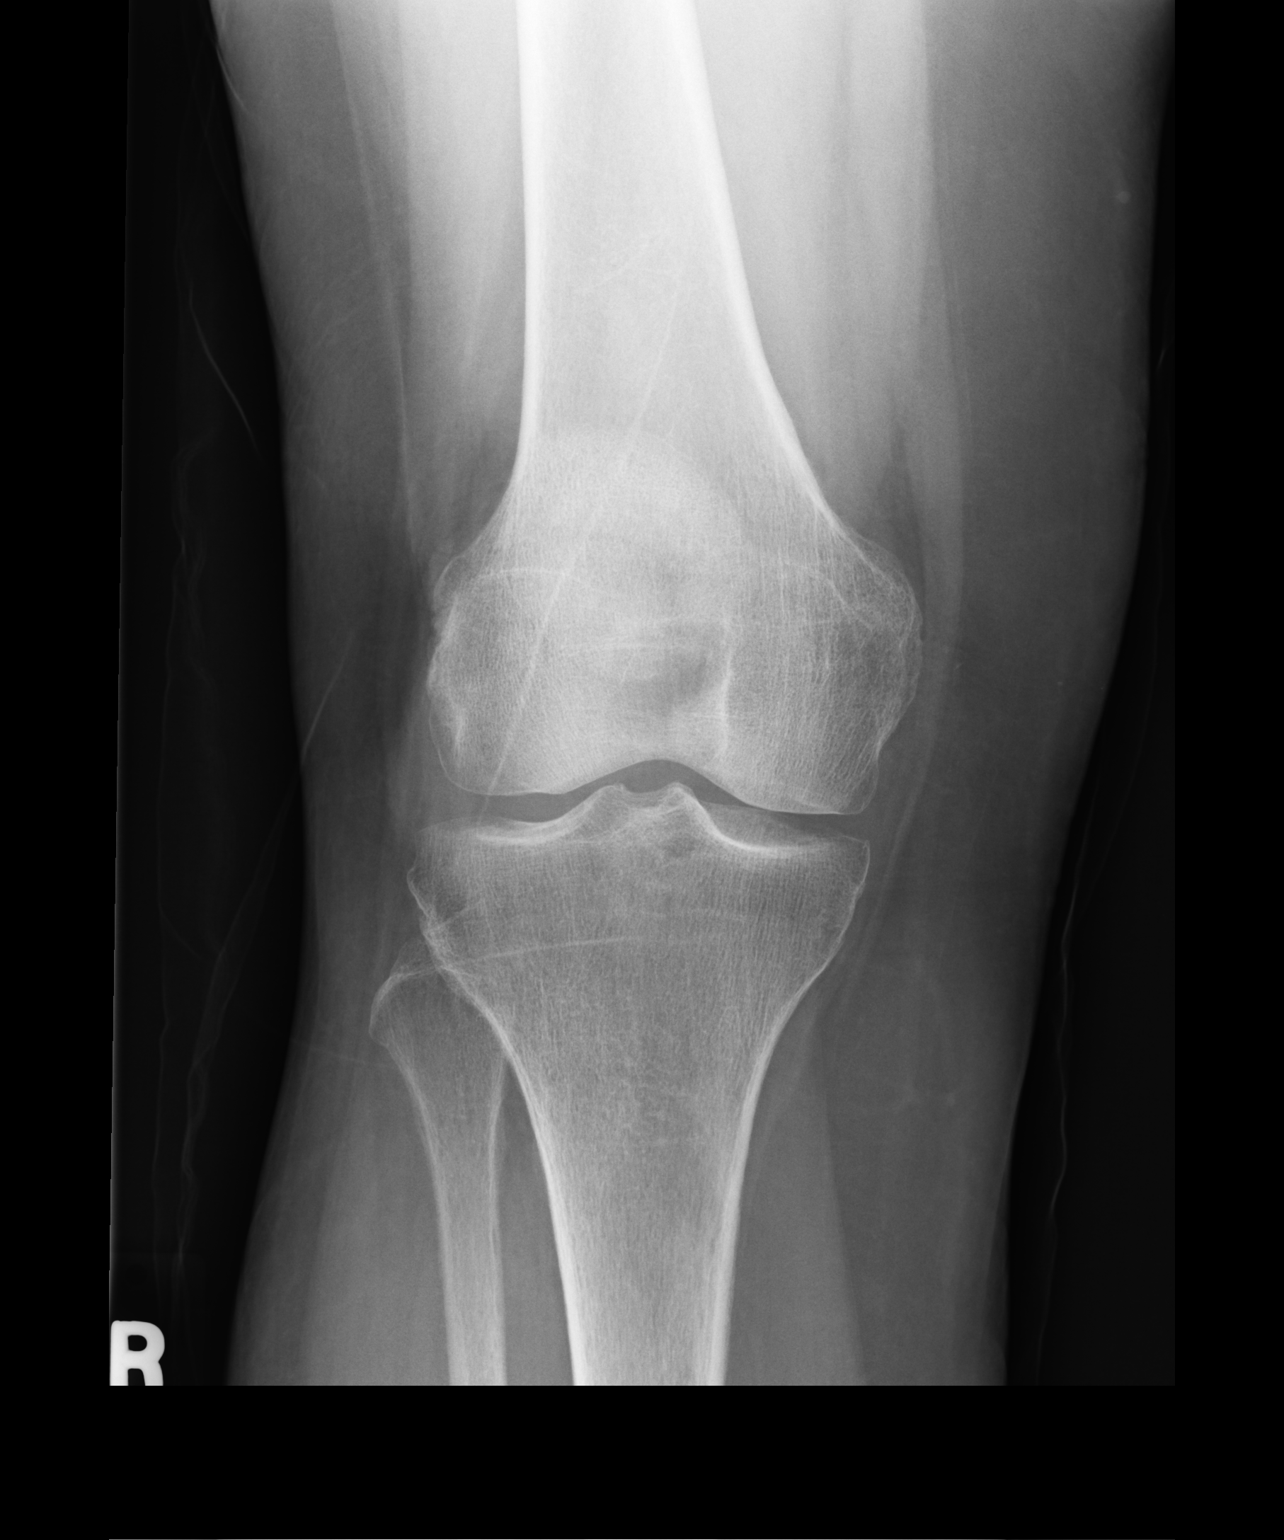

[dg knee ap/lat w/ sunrise right (2 of 2)]
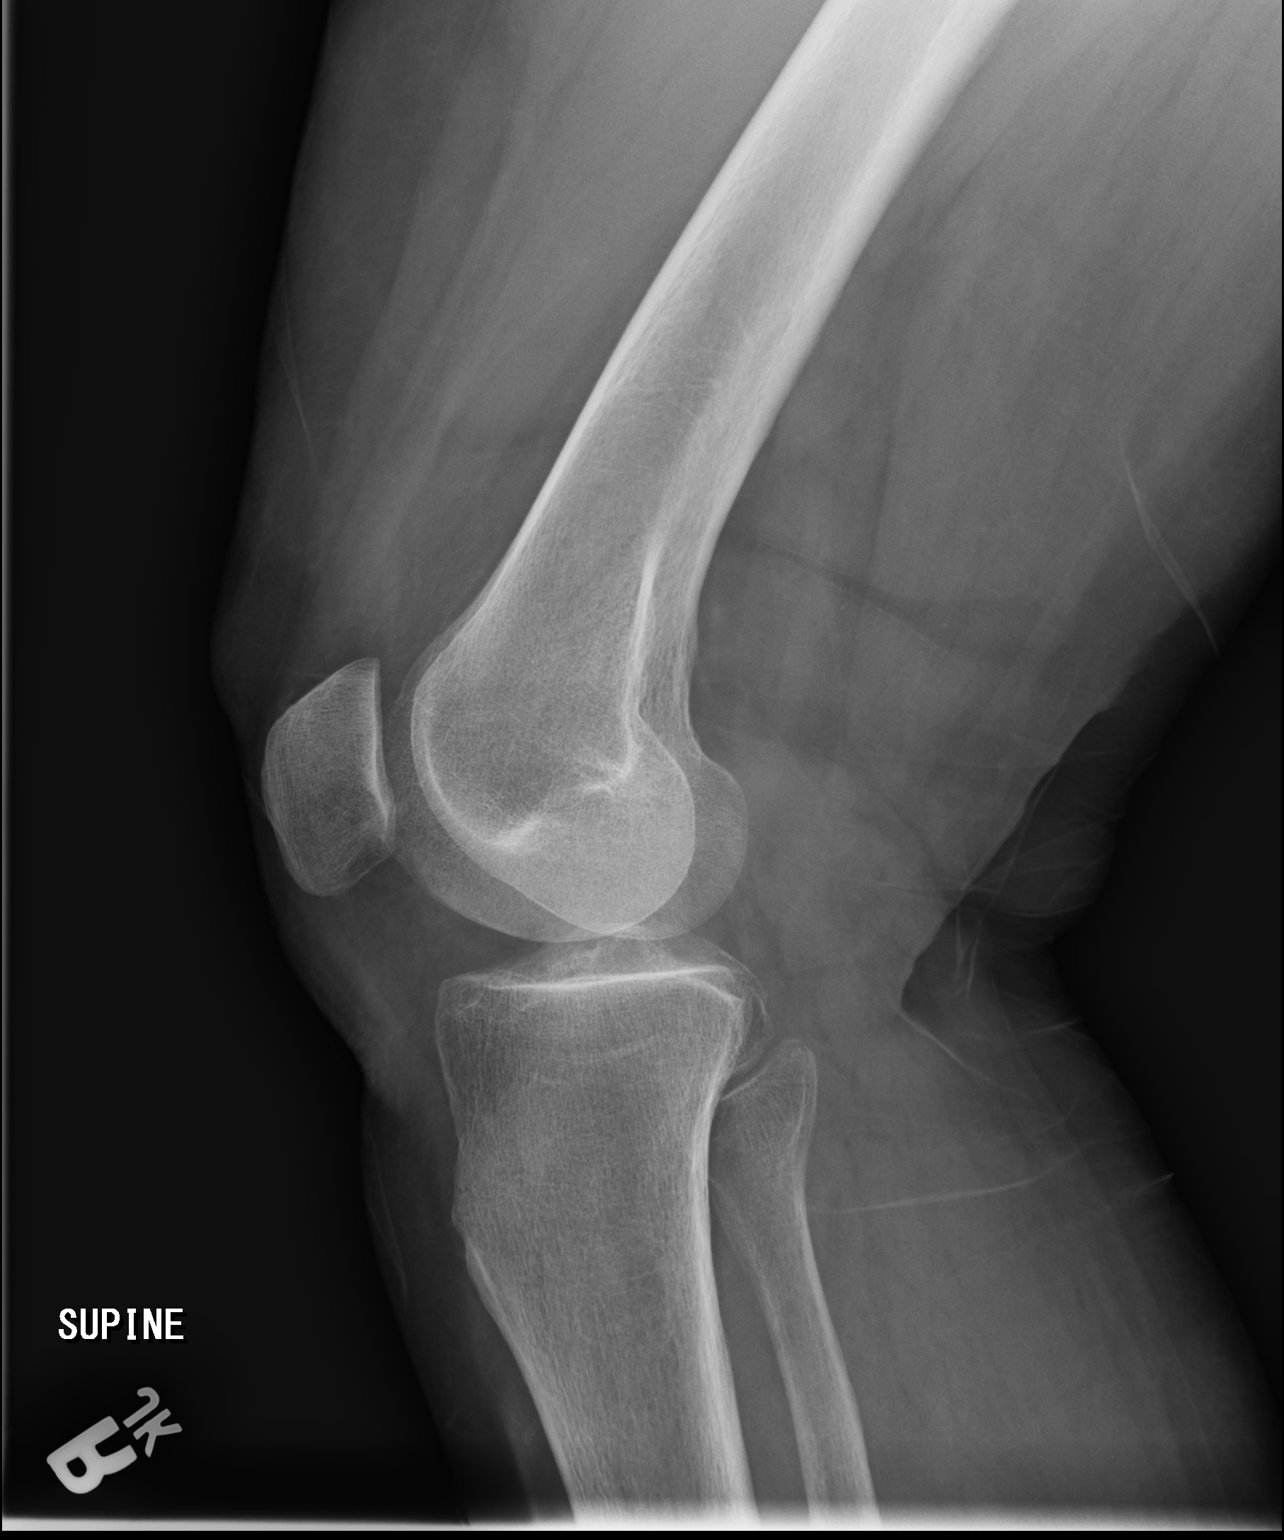

[2 of 2 positions shown; findings below may reference images not displayed]

FINDINGS: No evidence of fracture, dislocation, or joint effusion. No evidence
of arthropathy or other focal bone abnormality. Soft tissues are
unremarkable.
IMPRESSION: Normal right knee.

## 2016-08-26 IMAGING — DX DG LUMBAR SPINE 2-3V
3 series · 3 of 3 positions shown · non-contrast
Comparison: [DATE] .

CLINICAL DATA: Back pain radiating to right hip and knee. No known
injury.

EXAM:
LUMBAR SPINE - 2-3 VIEW

[dg lumbar spine 2-3 views (1 of 3)]
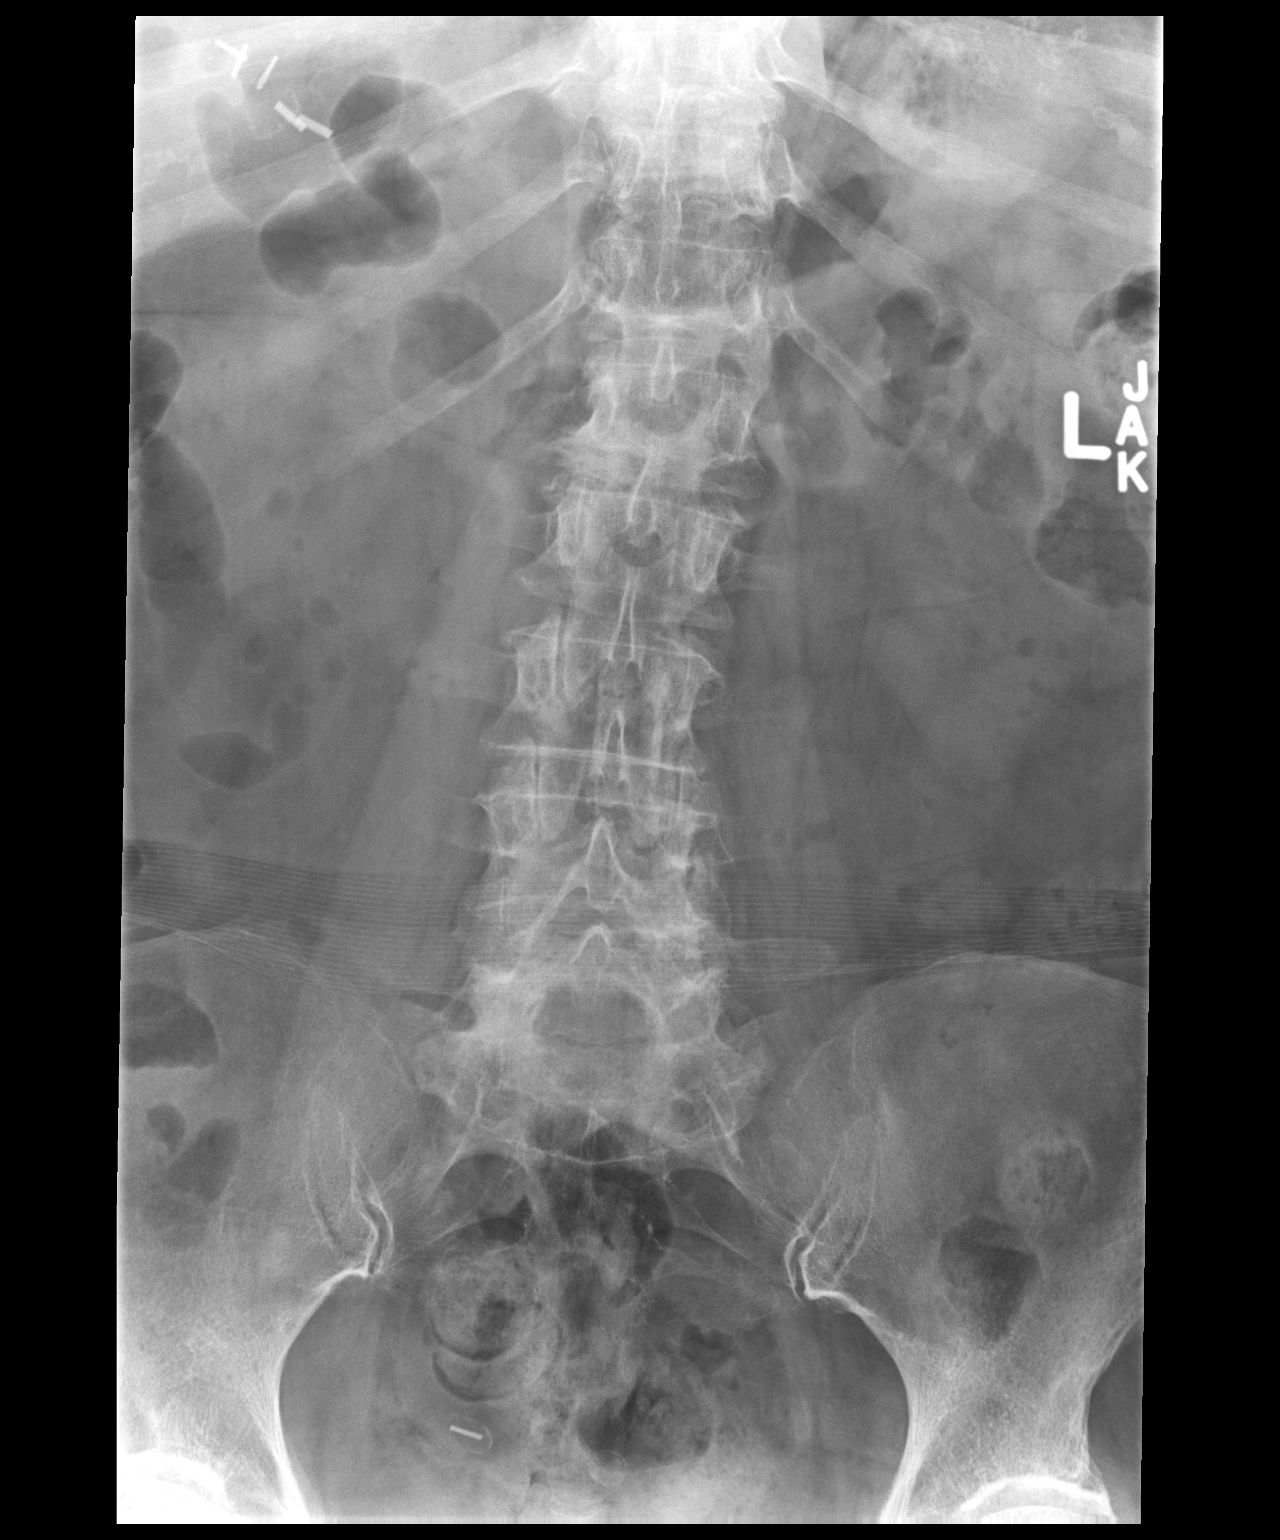

[dg lumbar spine 2-3 views (2 of 3)]
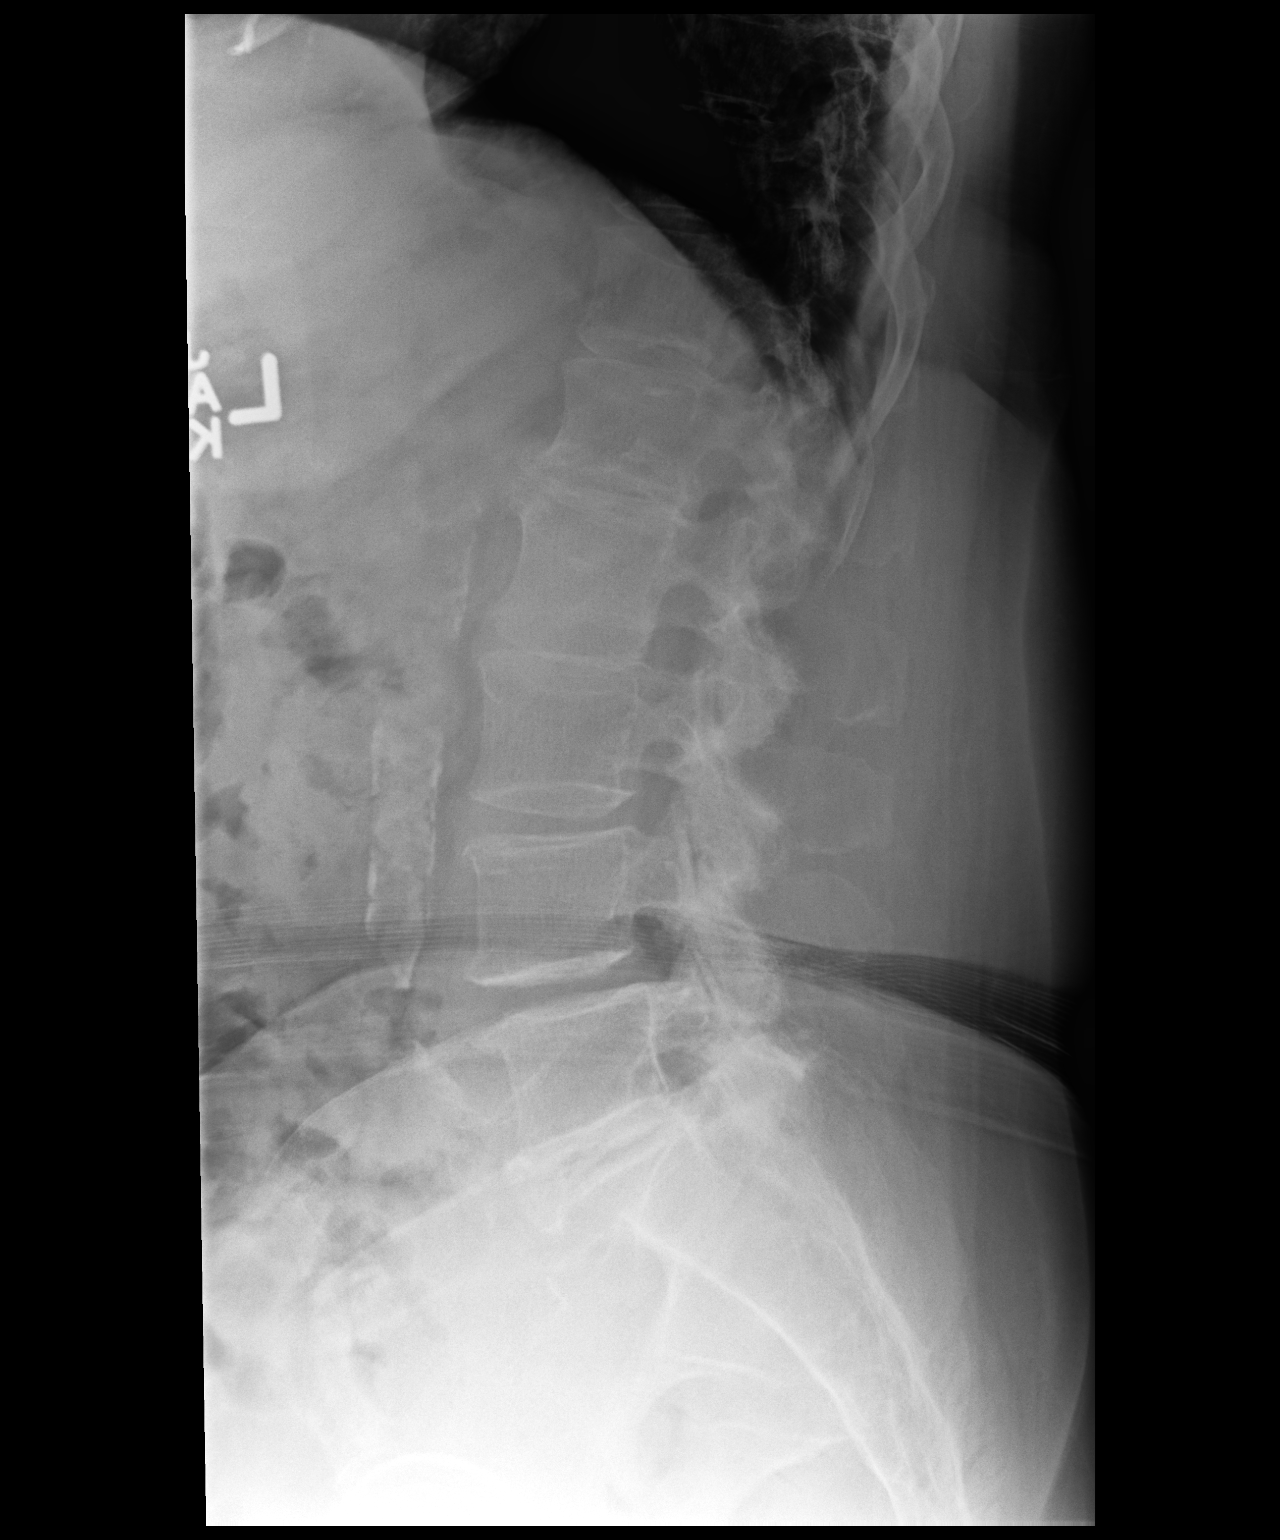

[dg lumbar spine 2-3 views (3 of 3)]
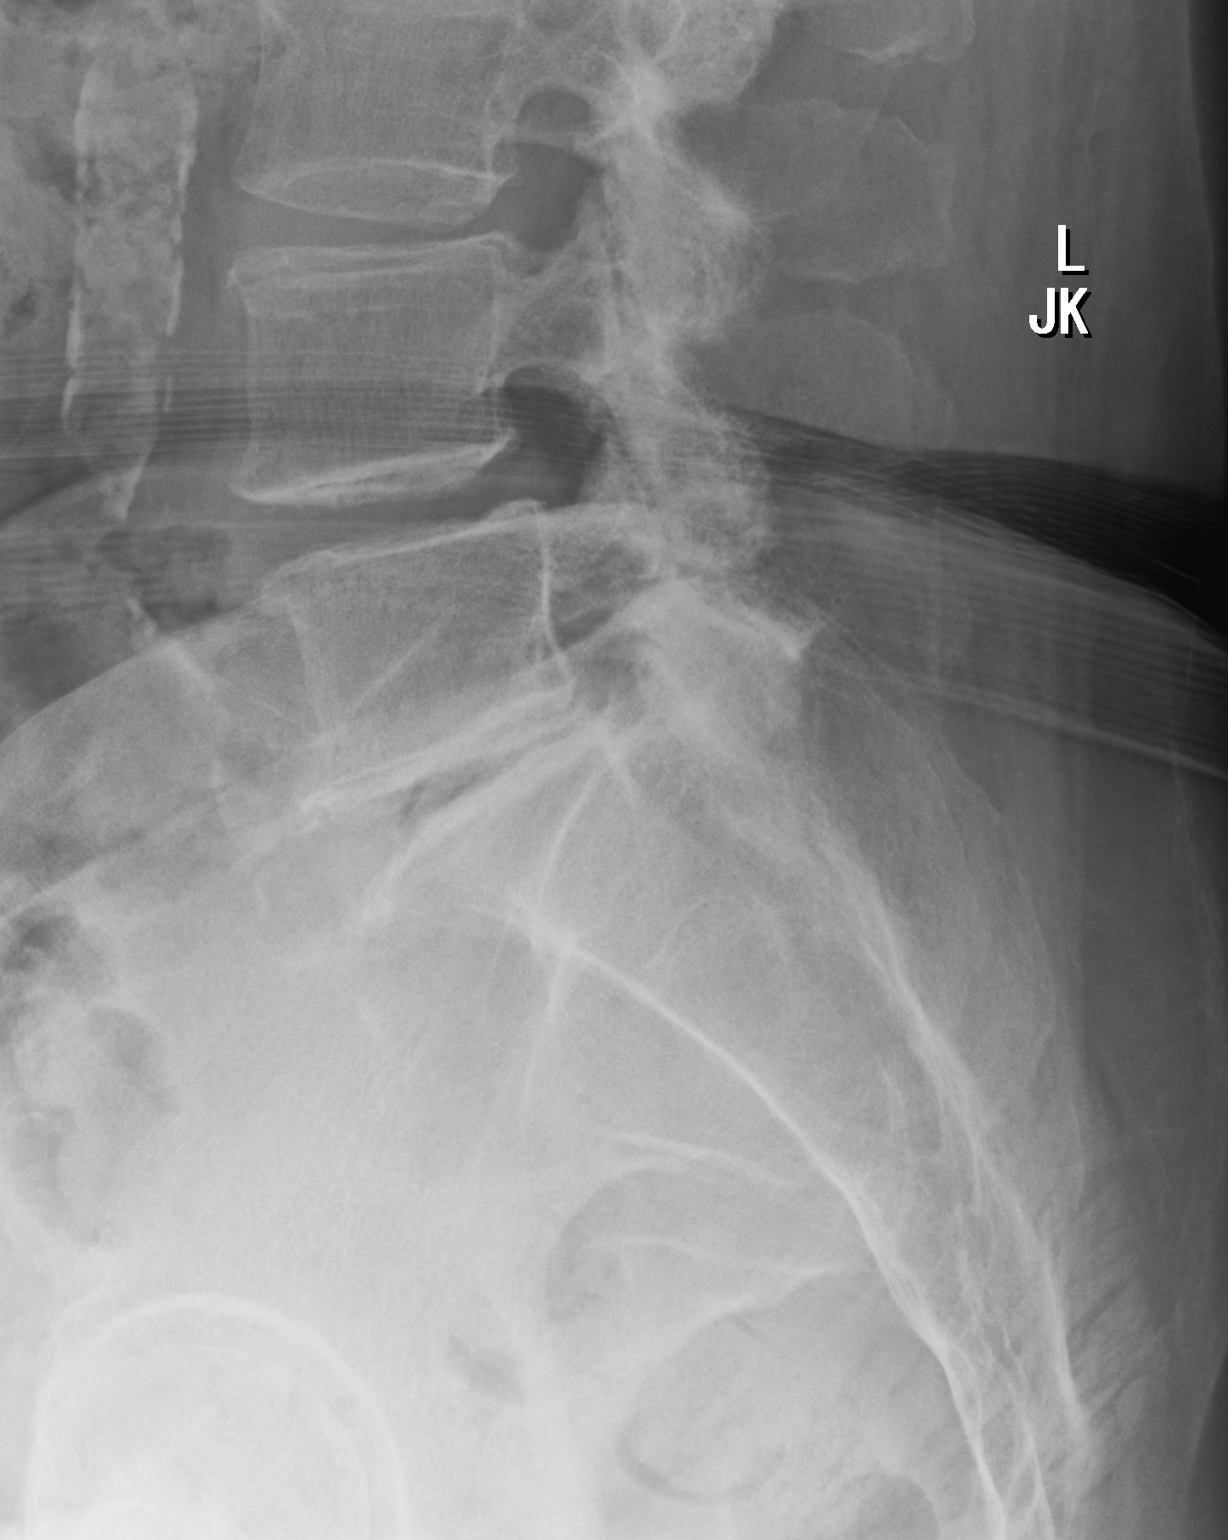

[3 of 3 positions shown; findings below may reference images not displayed]

FINDINGS: Lumbar vertebra numbered with the lowest segmented lumbar shaped
vertebra on lateral view as L5. Thoracolumbar spine scoliosis.
Diffuse degenerative change. 2 mm anterolisthesis L4 on L5. Pill for
No acute bony abnormality. Aortoiliac atherosclerotic vascular
calcification. Clips right upper quadrant and pelvis vascular
calcification noted.
IMPRESSION: 1. Thoracolumbar spine scoliosis and diffuse degenerative change. 2
mm anterolisthesis L4 on L5.

2.  No acute abnormality.

## 2016-08-26 IMAGING — DX DG HIP (WITH OR WITHOUT PELVIS) 2-3V*R*
2 series · 2 of 2 positions shown · non-contrast
Comparison: None.

CLINICAL DATA: Right hip pain for 2 weeks without known injury.

EXAM:
DG HIP (WITH OR WITHOUT PELVIS) 2-3V RIGHT

[dg hip unilat w or w/o pelvis 2-3 views  (1 of 2)]
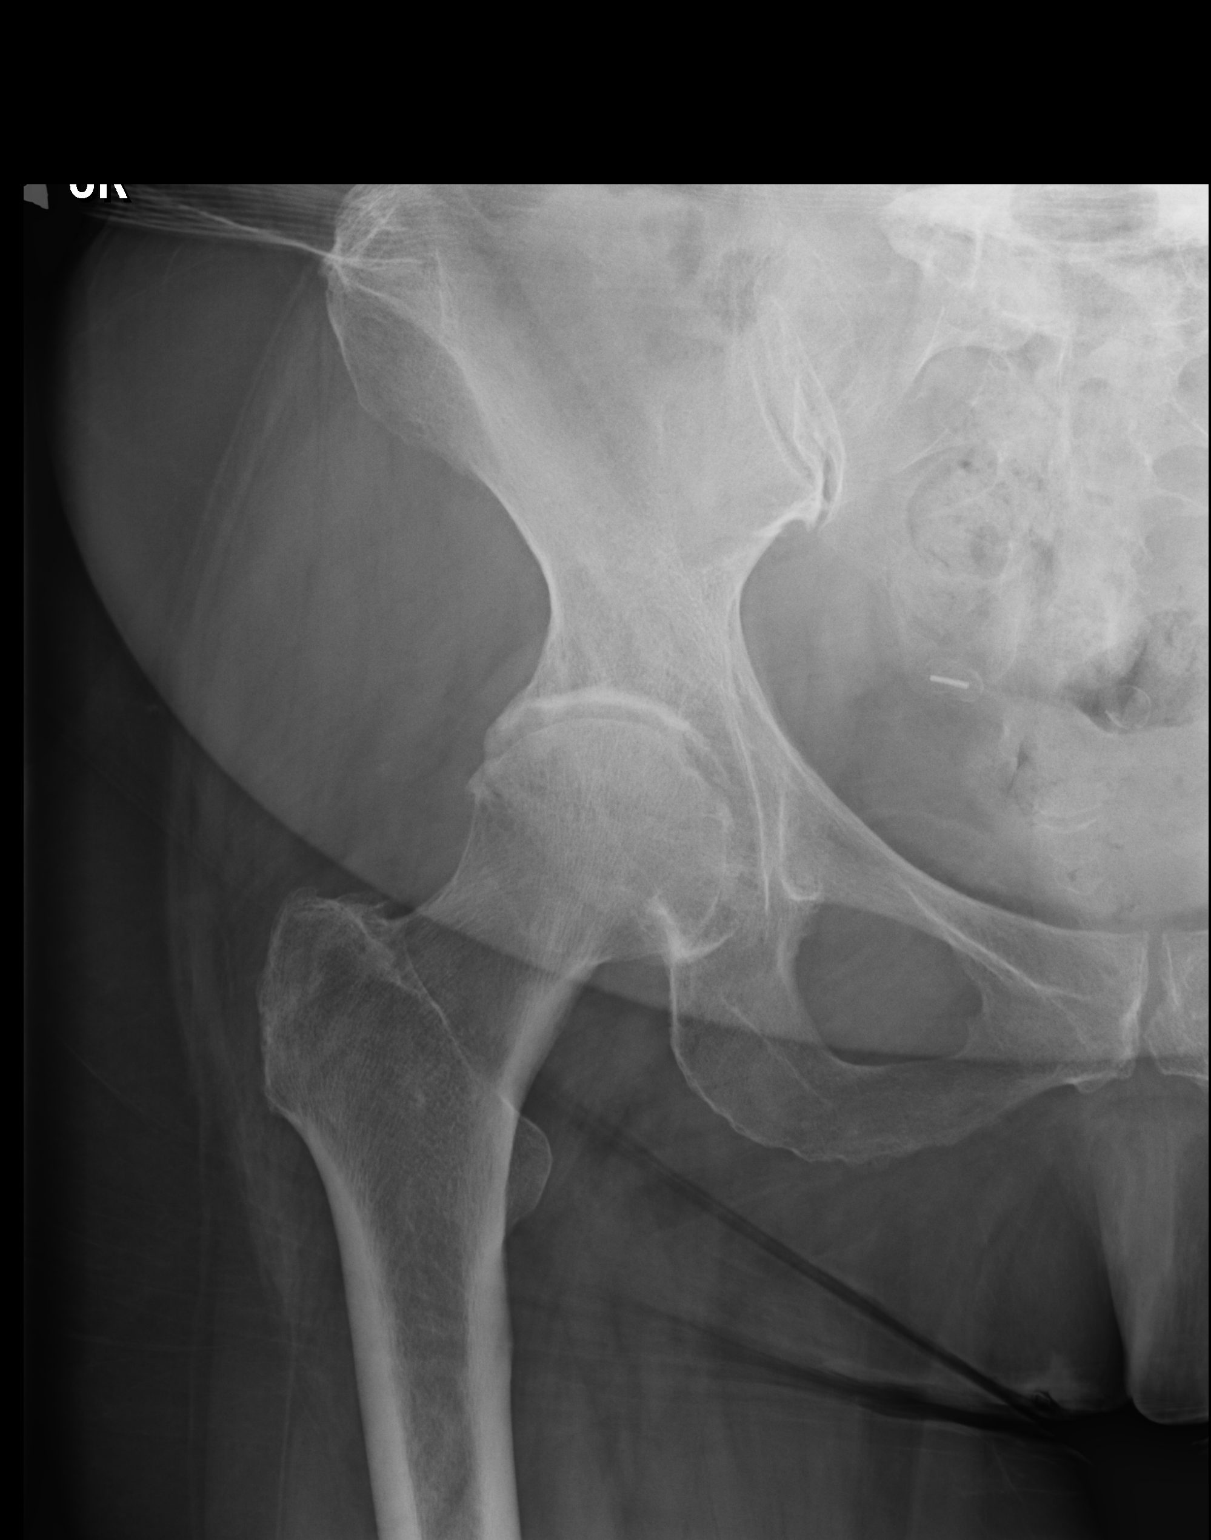

[dg hip unilat w or w/o pelvis 2-3 views  (2 of 2)]
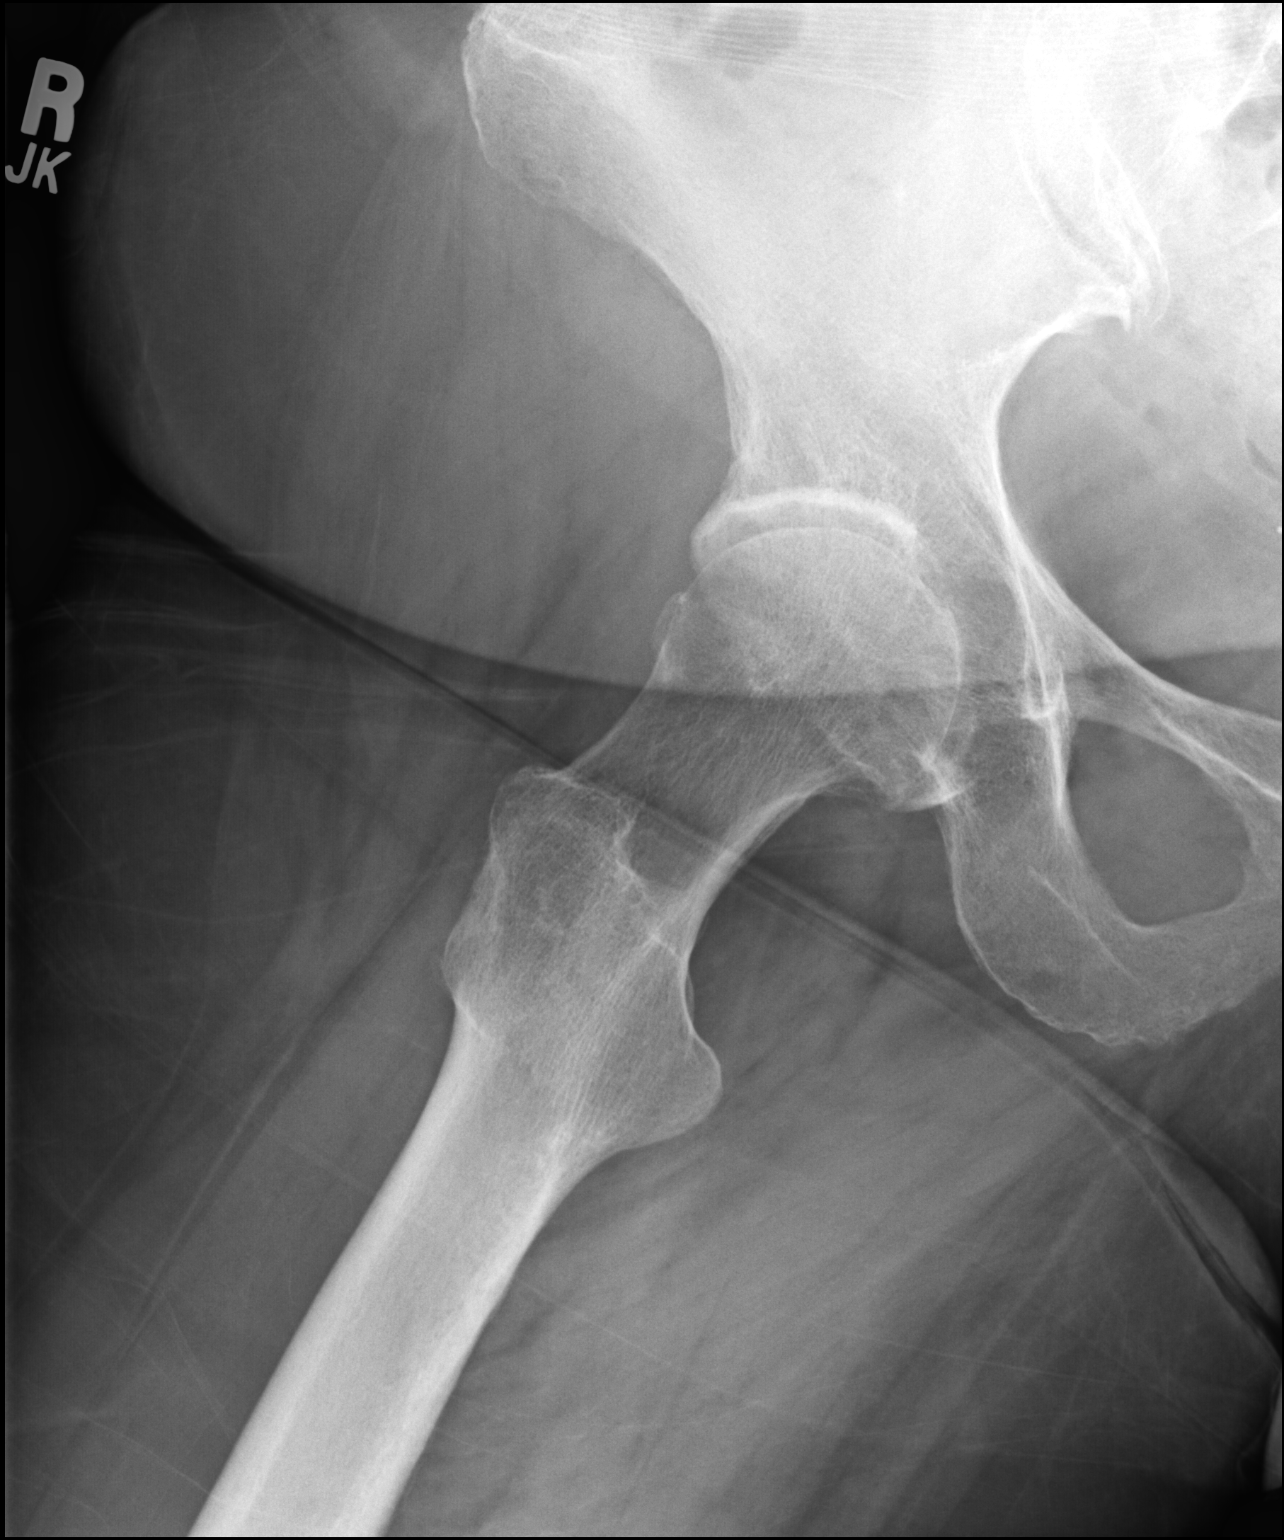

[2 of 2 positions shown; findings below may reference images not displayed]

FINDINGS: There is no evidence of hip fracture or dislocation. There is no
evidence of arthropathy or other focal bone abnormality.
IMPRESSION: Normal right hip.

## 2016-08-26 MED ORDER — CLINDAMYCIN HCL 150 MG PO CAPS: 150.0000 mg | ORAL_CAPSULE | Freq: Three times a day (TID) | ORAL | 0 refills | Status: DC

## 2016-08-26 MED ORDER — NAPROXEN 500 MG PO TABS: 500.0000 mg | ORAL_TABLET | Freq: Two times a day (BID) | ORAL | 0 refills | Status: DC

## 2016-08-26 MED ORDER — TRAMADOL HCL 50 MG PO TABS: 50.0000 mg | ORAL_TABLET | Freq: Three times a day (TID) | ORAL | 0 refills | Status: DC | PRN

## 2016-08-26 NOTE — Patient Instructions (Addendum)
Stacey Velazquez was seen today for hip pain.  Diagnoses and all orders for this visit:  Gingival abscess -     clindamycin (CLEOCIN) 150 MG capsule; Take 1 capsule (150 mg total) by mouth 3 (three) times daily.  Acute right hip pain -     traMADol (ULTRAM) 50 MG tablet; Take 1 tablet (50 mg total) by mouth every 8 (eight) hours as needed for severe pain. -     naproxen (NAPROSYN) 500 MG tablet; Take 1 tablet (500 mg total) by mouth 2 (two) times daily with a meal. -     DG Lumbar Spine 2-3 Views; Future -     DG HIP UNILAT WITH PELVIS 2-3 VIEWS RIGHT; Future -     DG Knee AP/LAT W/Sunrise Right; Future -     Ambulatory referral to Orthopedic Surgery   Please complete x-rays at [place], see map Call if medication refilled needed prior to follow up  F/u in 6 weeks for pain   Dr. Adrian Blackwater

## 2016-08-26 NOTE — Assessment & Plan Note (Signed)
A: acute pain in R hip, low back and knee. No injury. No evidence of infection. ? OA vs lumbar radiculopathy P: X-rays of L spine, R hip and knee Tramadol with naproxen for pain Ortho referral placed

## 2016-08-26 NOTE — Progress Notes (Signed)
Subjective:  Patient ID: Stacey Velazquez, female    DOB: August 01, 1945  Age: 71 y.o. MRN: [id number]  CC: Hip Pain   HPI Stacey Velazquez has autoimmune hepatitis ( followed q 3 months by GI, Dr. Benson Norway), bipolar disorder ( followed by Mental Health), seizure disorder and Risperdal induced Parkinson ( followed by Neurology) she presents for   1. Right hip pain: started 3 weeks ago. No injury. Entire R hip pain radiating down to her knee. She feels her leg giving out. She has pain with sitting, pain with bending, pain with prolonged standing and nighttime. She tried OTC naproxen 220 mg BID with no improvement. She is walking with a cane. Her current level of pain 10/10. She describes the pain as very sharp and cramping. Pain is constant. She has intermittent low back pain. She denies rash, fever and chills.   2. Gum pain and  facial swelling: started a few weeks ago. Was very swollen has improved. Has known poor dentition. Noticed a sore on her gumline.   Social History  Substance Use Topics  . Smoking status: Former Smoker    Quit date: 03/02/2003  . Smokeless tobacco: Never Used  . Alcohol use No    Outpatient Medications Prior to Visit  Medication Sig Dispense Refill  . azaTHIOprine (IMURAN) 50 MG tablet Take 50 mg by mouth 3 (three) times daily.    . benztropine (COGENTIN) 1 MG tablet Take 1 mg by mouth 2 (two) times daily.   0  . Cholecalciferol (VITAMIN D) 1000 UNITS capsule Take 1,000 Units by mouth daily.    . furosemide (LASIX) 20 MG tablet take 1 tablet by mouth once daily 90 tablet 1  . Lactobacillus (ACIDOPHOLUS PO) Take 1 tablet by mouth daily as needed (lactose intolerance).    Marland Kitchen levETIRAcetam (KEPPRA) 500 MG tablet Take 1 tablet (500 mg total) by mouth 2 (two) times daily. 180 tablet 4  . levothyroxine (SYNTHROID, LEVOTHROID) 100 MCG tablet Take 1 tablet (100 mcg total) by mouth daily before breakfast. 30 tablet 5  . meclizine (ANTIVERT) 12.5 MG tablet Take 1 tablet (12.5 mg  total) by mouth 3 (three) times daily as needed for dizziness. 90 tablet 2  . potassium chloride SA (K-DUR,KLOR-CON) 20 MEQ tablet Take 1 tablet (20 mEq total) by mouth daily. 30 tablet 3  . predniSONE (DELTASONE) 5 MG tablet Take 0.5 tablets by mouth daily.    Marland Kitchen VRAYLAR 1.5 MG CAPS Take 1 capsule by mouth at bedtime.    Marland Kitchen ketoprofen (ORUDIS) 75 MG capsule Take 1 capsule (75 mg total) by mouth 3 (three) times daily as needed (for headache). (Patient not taking: Reported on 08/26/2016) 30 capsule 0   No facility-administered medications prior to visit.     ROS Review of Systems  Constitutional: Negative for chills and fever.  HENT: Positive for dental problem.   Eyes: Negative for visual disturbance.  Respiratory: Negative for shortness of breath.   Cardiovascular: Positive for palpitations. Negative for chest pain.  Gastrointestinal: Negative for abdominal pain and blood in stool.  Musculoskeletal: Positive for arthralgias, back pain and gait problem.  Skin: Negative for rash.  Allergic/Immunologic: Negative for immunocompromised state.  Neurological: Positive for dizziness and tremors.  Hematological: Negative for adenopathy. Does not bruise/bleed easily.  Psychiatric/Behavioral: Negative for dysphoric mood and suicidal ideas.    Objective:  BP 106/74   Pulse 96   Temp (!) 97.5 F (36.4 C) (Oral)   Ht 5\' 9"  (1.753 m)  Wt 245 lb 12.8 oz (111.5 kg)   SpO2 96%   BMI 36.30 kg/m   BP/Weight 08/26/2016 03/01/2016 01/27/7251  Systolic BP 664 403 474  Diastolic BP 74 91 66  Wt. (Lbs) 245.8 244.2 245.4  BMI 36.3 36.06 36.24   Pulse Readings from Last 3 Encounters:  08/26/16 96  03/01/16 (!) 115  01/14/16 84    Physical Exam  Constitutional: She is oriented to person, place, and time. She appears well-developed and well-nourished. No distress.  HENT:  Head: Normocephalic and atraumatic.  Mouth/Throat:    Neck: No thyromegaly present.  Cardiovascular: Regular rhythm, normal  heart sounds and intact distal pulses.  Tachycardia present.   Pulmonary/Chest: Effort normal and breath sounds normal.  Musculoskeletal: She exhibits no edema.       Right hip: She exhibits decreased range of motion, tenderness and bony tenderness. She exhibits normal strength, no swelling, no crepitus, no deformity and no laceration.       Right knee: She exhibits decreased range of motion. No tenderness found.       Legs: Neurological: She is alert and oriented to person, place, and time. She displays tremor (resting tremor ).  Skin: Skin is warm and dry. No rash noted.  Psychiatric: She has a normal mood and affect.    Lab Results  Component Value Date   TSH 1.20 03/01/2016    Assessment & Plan:   Stacey Velazquez was seen today for hip pain.  Diagnoses and all orders for this visit:  Gingival abscess -     clindamycin (CLEOCIN) 150 MG capsule; Take 1 capsule (150 mg total) by mouth 3 (three) times daily.  Acute right hip pain -     traMADol (ULTRAM) 50 MG tablet; Take 1 tablet (50 mg total) by mouth every 8 (eight) hours as needed for severe pain. -     naproxen (NAPROSYN) 500 MG tablet; Take 1 tablet (500 mg total) by mouth 2 (two) times daily with a meal. -     DG Lumbar Spine 2-3 Views; Future -     DG HIP UNILAT WITH PELVIS 2-3 VIEWS RIGHT; Future -     DG Knee AP/LAT W/Sunrise Right; Future -     Ambulatory referral to Orthopedic Surgery   No orders of the defined types were placed in this encounter.   Follow-up: Return in about 6 weeks (around 10/07/2016) for R hip pain .   Boykin Nearing MD

## 2016-08-26 NOTE — Assessment & Plan Note (Signed)
Gingival abscess improving but still with slight swelling and tenderness Plan: Clindamycin

## 2016-09-01 ENCOUNTER — Encounter: Payer: Medicare Other | Admitting: *Deleted

## 2016-09-02 ENCOUNTER — Telehealth: Payer: Self-pay

## 2016-09-02 ENCOUNTER — Encounter: Payer: Self-pay | Admitting: Cardiology

## 2016-09-02 DIAGNOSIS — K754 Autoimmune hepatitis: Secondary | ICD-10-CM | POA: Diagnosis not present

## 2016-09-02 NOTE — Telephone Encounter (Signed)
Pt was called and informed of lab results and ortho referral being placed.

## 2016-09-07 ENCOUNTER — Telehealth: Payer: Self-pay | Admitting: Family Medicine

## 2016-09-07 DIAGNOSIS — E039 Hypothyroidism, unspecified: Secondary | ICD-10-CM

## 2016-09-07 MED ORDER — LEVOTHYROXINE SODIUM 100 MCG PO TABS: 100.0000 ug | ORAL_TABLET | Freq: Every day | ORAL | 2 refills | Status: DC

## 2016-09-07 NOTE — Telephone Encounter (Signed)
Pt called to request to request a refill for  levothyroxine (SYNTHROID, LEVOTHROID) 100 MCG tablet   She was a Pt of Dr. Adrian [place] she need a refill until she see Dr. Jarold Song on 10/10/16, if possible to auth, please follow up

## 2016-09-07 NOTE — Telephone Encounter (Signed)
Levothyroxine refilled

## 2016-09-09 ENCOUNTER — Encounter (INDEPENDENT_AMBULATORY_CARE_PROVIDER_SITE_OTHER): Payer: Self-pay | Admitting: Physician Assistant

## 2016-09-09 ENCOUNTER — Ambulatory Visit (INDEPENDENT_AMBULATORY_CARE_PROVIDER_SITE_OTHER): Payer: Medicare Other | Admitting: Physician Assistant

## 2016-09-09 DIAGNOSIS — M25551 Pain in right hip: Secondary | ICD-10-CM | POA: Diagnosis not present

## 2016-09-09 MED ORDER — NAPROXEN 500 MG PO TABS: 500.0000 mg | ORAL_TABLET | Freq: Two times a day (BID) | ORAL | 0 refills | Status: DC

## 2016-09-09 MED ORDER — TRAMADOL HCL 50 MG PO TABS: 50.0000 mg | ORAL_TABLET | Freq: Three times a day (TID) | ORAL | 0 refills | Status: DC | PRN

## 2016-09-09 NOTE — Progress Notes (Signed)
Office Visit Note   Patient: Stacey Velazquez           Date of Birth: 05-Dec-1945           MRN: [id number] Visit Date: 09/09/2016              Requested by: Boykin Nearing, MD [address] [place], [place] [address] PCP: Arnoldo Morale, MD   Assessment & Plan: Visit Diagnoses:  1. Acute right hip pain     Plan: We refilled her naproxen and her tramadol. She is unable to have any type of cortisone injection due to the fact that she has seizures with cortisone. She is on long term prednisone. Therefore will send her to physical therapy for IT band stretching and quad strengthening, hamstring gastrocsoleus stretching, home exercise program and modalities.  Follow-Up Instructions: No Follow-up on file.   Orders:  Orders Placed This Encounter  Procedures  . Ambulatory referral to Physical Therapy   Meds ordered this encounter  Medications  . traMADol (ULTRAM) 50 MG tablet    Sig: Take 1 tablet (50 mg total) by mouth every 8 (eight) hours as needed for severe pain.    Dispense:  20 tablet    Refill:  0  . naproxen (NAPROSYN) 500 MG tablet    Sig: Take 1 tablet (500 mg total) by mouth 2 (two) times daily with a meal.    Dispense:  60 tablet    Refill:  0      Procedures: No procedures performed   Clinical Data: No additional findings.   Subjective: Chief Complaint  Patient presents with  . Back Pain    with radicular right leg pain    HPI M.D. 71-year-old female comes in today with low back pain right hip and knee pain. Pain is been ongoing for the past 3 weeks no known injury. Pain radiates from the right buttocks region down into the anterior lateral aspect of her right thigh. She's had no bowel bladder dysfunction. Pain does not awaken her. She's tried Aspercreme Aleve without any relief. Tramadol does allow her to sleep some. She states she can't sit or stand for long time. She cannot lie on her right hip. She notes she's had numbness tingling in her feet for  years Mrs. remained unchanged. She states that she is unable to do cortisone injections due to the fact this causes seizures in the past. She does have a pacemaker. Radiographs reviewed on the cutting system dated 08/26/2016 : Right knee no acute fractures no bony abnormalities. Knee is well located. No effusion. Right hip no acute fracture. Hip is well located. No bony abnormalities hip joint well maintained. Lumbar spine 2 views shows thoracolumbar scoliosis. Diffuse degenerative changes. Grade 1 anterior spondylolisthesis of L4 on 5.  Review of Systems Please see history of present illness otherwise negative  Objective: Vital Signs: Ht 5\' 9"  (1.753 m)   Wt 245 lb (111.1 kg)   BMI 36.18 kg/m   Physical Exam  Constitutional: She is oriented to person, place, and time. She appears well-developed and well-nourished. No distress.  Cardiovascular: Intact distal pulses.   Pulmonary/Chest: Breath sounds normal.  Neurological: She is alert and oriented to person, place, and time.  Skin: She is not diaphoretic.  Psychiatric: She has a normal mood and affect.    Ortho Exam I'll see subjective decreased sensation of light touch. Thigh to 5 strength throughout lower extremities against resistance. Negative straight leg raise bilaterally. She has tenderness over  the bilateral knees medial lateral joint line. No effusion abnormal warmth of either knee. No instability of either knee. She has tenderness over the right trochanteric bursa region and down the entire right IT band. Good range of motion of both hips. She has difficulty getting on and off the exam table.  Specialty Comments:  No specialty comments available.  Imaging: No results found.   PMFS History: Patient Active Problem List   Diagnosis Date Noted  . Gingival abscess 08/26/2016  . Acute right hip pain 08/26/2016  . Seizure disorder ([place]) 10/14/2014  . Drug-induced Parkinson's disease ([place]) 10/14/2014  . Hypothyroidism  09/09/2014  . Azathioprine-induced leukopenia ([place]) 08/25/2014  . Thrombocytopenia due to drugs 08/25/2014  . Hepatitis, autoimmune ([place]) 08/25/2014  . Syncope   . Seizures ([place])   . Sinus arrest   . Pacemaker Medtronic   . Bipolar 1 disorder ([place])    Past Medical History:  Diagnosis Date  . Anxiety   . Autoimmune hepatitis ([place])   . Bipolar 1 disorder ([place])   . GERD (gastroesophageal reflux disease)   . Glaucoma 2003   . Heart disease   . Hepatitis, autoimmune ([place]) 08/25/2014  . Hypotension   . Hypothyroidism   . Pacemaker Medtronic    MRI compatible  . Seizures ([place])    last sz 08/21/14  . Sinus arrest   . Vertigo    bvvp    Family History  Problem Relation Age of Onset  . Heart attack Father   . Heart disease Paternal Uncle   . Heart disease Paternal Grandmother   . Diabetes Paternal Grandmother   . Heart disease Paternal Grandfather   . Cancer Mother        BREAST  . Breast cancer Mother   . Cancer Maternal Grandmother   . CVA Maternal Grandmother   . Cancer Maternal Grandfather   . CVA Maternal Grandfather   . Diabetes Sister   . Diabetes Brother   . Diabetes Brother   . Stroke Daughter     Past Surgical History:  Procedure Laterality Date  . CATARACT EXTRACTION Left 2008  . KNEE SURGERY Left 1985  . PACEMAKER INSERTION  2014  . PARTIAL HYSTERECTOMY  1979   Social History   Occupational History  . Not on file.   Social History Main Topics  . Smoking status: Former Smoker    Quit date: 03/02/2003  . Smokeless tobacco: Never Used  . Alcohol use No  . Drug use: No  . Sexual activity: Not on file

## 2016-09-13 ENCOUNTER — Ambulatory Visit (INDEPENDENT_AMBULATORY_CARE_PROVIDER_SITE_OTHER): Payer: Medicare Other | Admitting: *Deleted

## 2016-09-13 ENCOUNTER — Telehealth (INDEPENDENT_AMBULATORY_CARE_PROVIDER_SITE_OTHER): Payer: Self-pay | Admitting: Physician Assistant

## 2016-09-13 DIAGNOSIS — I495 Sick sinus syndrome: Secondary | ICD-10-CM

## 2016-09-13 NOTE — Telephone Encounter (Signed)
Patient called advised she did not get the (Tramadol) Patient said she did get the Naproxen. The number to contact patient is  [phone]

## 2016-09-14 NOTE — Telephone Encounter (Signed)
Called into her pharmacy  

## 2016-09-14 NOTE — Progress Notes (Signed)
Remote pacemaker transmission.   

## 2016-09-15 LAB — CUP PACEART REMOTE DEVICE CHECK
Battery Remaining Longevity: 79 mo
Battery Voltage: 3.01 V
Brady Statistic AP VP Percent: 0.03 %
Brady Statistic AP VS Percent: 9.13 %
Brady Statistic AS VP Percent: 0.19 %
Brady Statistic AS VS Percent: 90.64 %
Brady Statistic RA Percent Paced: 9.15 %
Brady Statistic RV Percent Paced: 0.22 %
Date Time Interrogation Session: 20180820130650
Implantable Lead Implant Date: 20141205
Implantable Lead Implant Date: 20141205
Implantable Lead Location: 753859
Implantable Lead Location: 753860
Implantable Lead Model: 5076
Implantable Lead Model: 5076
Implantable Pulse Generator Implant Date: 20141205
Lead Channel Impedance Value: 342 Ohm
Lead Channel Impedance Value: 380 Ohm
Lead Channel Impedance Value: 513 Ohm
Lead Channel Impedance Value: 513 Ohm
Lead Channel Pacing Threshold Amplitude: 0.375 V
Lead Channel Pacing Threshold Amplitude: 0.875 V
Lead Channel Pacing Threshold Pulse Width: 0.4 ms
Lead Channel Pacing Threshold Pulse Width: 0.4 ms
Lead Channel Sensing Intrinsic Amplitude: 1.625 mV
Lead Channel Sensing Intrinsic Amplitude: 1.625 mV
Lead Channel Sensing Intrinsic Amplitude: 7.5 mV
Lead Channel Sensing Intrinsic Amplitude: 7.5 mV
Lead Channel Setting Pacing Amplitude: 2 V
Lead Channel Setting Pacing Amplitude: 2.5 V
Lead Channel Setting Pacing Pulse Width: 0.4 ms
Lead Channel Setting Sensing Sensitivity: 0.6 mV

## 2016-09-24 ENCOUNTER — Encounter: Payer: Self-pay | Admitting: Cardiology

## 2016-09-30 ENCOUNTER — Ambulatory Visit: Payer: Medicare Other | Attending: Internal Medicine | Admitting: Physical Therapy

## 2016-09-30 DIAGNOSIS — G8929 Other chronic pain: Secondary | ICD-10-CM | POA: Diagnosis not present

## 2016-09-30 DIAGNOSIS — R262 Difficulty in walking, not elsewhere classified: Secondary | ICD-10-CM | POA: Diagnosis not present

## 2016-09-30 DIAGNOSIS — M62830 Muscle spasm of back: Secondary | ICD-10-CM | POA: Insufficient documentation

## 2016-09-30 DIAGNOSIS — M5441 Lumbago with sciatica, right side: Secondary | ICD-10-CM | POA: Diagnosis not present

## 2016-10-01 NOTE — Therapy (Signed)
Stacey Velazquez, [place], [address] Phone: [phone]   Fax:  [phone]  Physical Therapy Evaluation  Patient Details  Name: Stacey Velazquez MRN: [id number] Date of Birth: 07/09/1945 Referring Provider: Erskine Emery PA  Encounter Date: 09/30/2016      PT End of Session - 10/01/16 1204    Visit Number 1   Number of Visits 16   Date for PT Re-Evaluation 11/26/16   Authorization Type medicare traid    PT Start Time 1330   PT Stop Time 1413   PT Time Calculation (min) 43 min   Activity Tolerance Patient tolerated treatment well   Behavior During Therapy [facility] for tasks assessed/performed      Past Medical History:  Diagnosis Date  . Anxiety   . Autoimmune hepatitis ([place])   . Bipolar 1 disorder ([place])   . GERD (gastroesophageal reflux disease)   . Glaucoma 2003   . Heart disease   . Hepatitis, autoimmune ([place]) 08/25/2014  . Hypotension   . Hypothyroidism   . Pacemaker Medtronic    MRI compatible  . Seizures ([place])    last sz 08/21/14  . Sinus arrest   . Vertigo    bvvp    Past Surgical History:  Procedure Laterality Date  . CATARACT EXTRACTION Left 2008  . KNEE SURGERY Left 1985  . PACEMAKER INSERTION  2014  . PARTIAL HYSTERECTOMY  1979    There were no vitals filed for this visit.       Subjective Assessment - 09/30/16 1336    Subjective Patient reports around the end of July she began having lower back pain the pain increased to the point were it was radiating down into her right leg. She has increased pain with house work. The medication helped with the raidating pain. At this time she has more pain when she is standing and walking. She uses the cane on the right side.  She has tremors which she reports is from medication. She also has seizures but she has nt had one in 2 years.    Limitations Standing;Walking   How long can you stand comfortably? < 5 min    How long can you walk  comfortably? limited distances with pain    Currently in Pain? Yes   Pain Score 5    Pain Orientation Right   Pain Descriptors / Indicators Aching   Pain Type Chronic pain   Pain Radiating Towards right buttock    Pain Onset More than a month ago   Pain Frequency Constant   Aggravating Factors  standing and walking    Pain Relieving Factors rest    Effect of Pain on Daily Activities difficulty walking and difficulty doing her daily chores             [facility] PT Assessment - 10/01/16 0001      Assessment   Medical Diagnosis Right sided lower back pain    Referring Provider Erskine Emery PA   Onset Date/Surgical Date 08/24/16   Hand Dominance Right   Prior Therapy None      Precautions   Precautions ICD/Pacemaker  Seizures     Restrictions   Weight Bearing Restrictions No     Balance Screen   Has the patient fallen in the past 6 months No   Has the patient had a decrease in activity level because of a fear of falling?  No   Is the patient reluctant to leave their home  because of a fear of falling?  No     Prior Function   Level of Independence Independent   Leisure has hand weights at home      Cognition   Overall Cognitive Status Within Functional Limits for tasks assessed   Attention Focused   Focused Attention Appears intact   Memory Appears intact   Awareness Appears intact   Problem Solving Appears intact     Observation/Other Assessments   Focus on Therapeutic Outcomes (FOTO)  60% limited      Sensation   Light Touch Appears Intact   Additional Comments Toes get numb      Coordination   Gross Motor Movements are Fluid and Coordinated Yes   Fine Motor Movements are Fluid and Coordinated Yes     ROM / Strength   AROM / PROM / Strength AROM;PROM;Strength     AROM   AROM Assessment Site Lumbar   Lumbar Flexion limited 50% with pain    Lumbar Extension limited 50% with pain    Lumbar - Right Side Bend pain    Lumbar - Left Side Bend no pain     Lumbar - Right Rotation pain with rotation    Lumbar - Left Rotation no pain      PROM   Overall PROM Comments Pain with right hip flexion past 90 degrees. pain with internal and external rotation    PROM Assessment Site Hip     Strength   Strength Assessment Site Hip;Knee   Right/Left Hip Right;Left   Right Hip Flexion 3/5   Right Hip ABduction 3/5   Right Hip ADduction 4/5   Left Hip Flexion 4/5   Left Hip ABduction 4/5   Left Hip ADduction 4/5   Right/Left Knee Right;Left   Right Knee Flexion 4+/5   Right Knee Extension 4/5   Left Knee Flexion 4+/5   Left Knee Extension 4+/5     Flexibility   Soft Tissue Assessment /Muscle Length yes   Hamstrings tightness in hamstrings      Palpation   Palpation comment spasming in the right paraspinals; significant tenderness to palpation in the right greater trochanter.             Objective measurements completed on examination: See above findings.          [facility] Adult PT Treatment/Exercise - 10/01/16 0001      Ambulation/Gait   Gait Comments leans hevily on the cane; decreased hip flexion bilateral; significant lateral movement with gait Reviewed use of the single point cane for ambualtion. .      Lumbar Exercises: Stretches   Passive Hamstring Stretch Limitations seated hamstring stretch    Lower Trunk Rotation Limitations x10    Piriformis Stretch Limitations 2x20 seconds      Lumbar Exercises: Seated   Other Seated Lumbar Exercises IT band roll out reviewed technique; hip abduction yellow band x10; ball squeeze x10;                 PT Education - 10/01/16 1202    Education provided Yes   Education Details HEP; symptom mangement; gait training with single point cane.    Person(s) Educated Patient   Methods Explanation;Demonstration;Tactile cues;Verbal cues   Comprehension Verbalized understanding;Returned demonstration;Verbal cues required;Tactile cues required;Need further instruction          PT  Short Term Goals - 10/01/16 1218      PT SHORT TERM GOAL #1   Title Patient will demsotrate a good  core contraction wihile sitting    Time 4   Period Weeks   Status New     PT SHORT TERM GOAL #2   Title Patient will increase right leg strength to 4/5    Time 4   Period Weeks   Status New     PT SHORT TERM GOAL #3   Title Patient will ambualte with 300' with cane on the left without pain    Time 4   Period Weeks   Status New           PT Long Term Goals - 10/01/16 1219      PT LONG TERM GOAL #1   Title Patient will be independent with initial HEP    Time 8   Period Weeks   Status New   Target Date 11/26/16     PT LONG TERM GOAL #2   Title Patient will ambualte 2000' without increased pain in order to go shopping    Time 8   Period Weeks   Status New   Target Date 11/26/16     PT LONG TERM GOAL #3   Title Patient will mop without increased lower back pain    Time 8   Period Weeks   Status New                Plan - 10/01/16 1206    Clinical Impression Statement Patient is a 71 year old female with right sided low back and hip pain. Signs and symptoms are consitent with lumbar DDD and right trochnateric bursitis. She keeps her cane on the right side. Therapy tried to swtich her cane to her left hand but then she began to have pain on the left. She was advised to try the cane on the left and then see how she tolerates it. She has right hip tightness and bilateral hip weakness. She would benefit from skilled therapy to improve her core and hip strength and stability.    History and Personal Factors relevant to plan of care: Pace maker; tremors; seizures    Clinical Presentation Evolving   Clinical Presentation due to: fluctuating pain levels    Clinical Decision Making Moderate   Rehab Potential Good   PT Frequency 2x / week   PT Duration 8 weeks   PT Treatment/Interventions ADLs/Self Care Home Management;Cryotherapy;Functional mobility  training;Iontophoresis 4mg /ml Dexamethasone;Stair training;Gait training;Moist Heat;Ultrasound;Therapeutic activities;Therapeutic exercise;Neuromuscular re-education;Patient/family education;Passive range of motion;Manual techniques;Taping   PT Next Visit Plan continue with light core strengthening. May benefit from seated exercises with her arms ( shoulder flexion, table press); review  HEP    Consulted and Agree with Plan of Care Patient      Patient will benefit from skilled therapeutic intervention in order to improve the following deficits and impairments:  Abnormal gait, Decreased strength, Pain, Decreased activity tolerance, Difficulty walking, Decreased range of motion, Obesity, Increased muscle spasms, Decreased knowledge of use of DME  Visit Diagnosis: Chronic bilateral low back pain with right-sided sciatica - Plan: PT plan of care cert/re-cert  Muscle spasm of back - Plan: PT plan of care cert/re-cert  Difficulty in walking, not elsewhere classified - Plan: PT plan of care cert/re-cert      G-Codes - 16/07/37 1222    Functional Assessment Tool Used (Outpatient Only) FOTO    Functional Limitation Mobility: Walking and moving around   Mobility: Walking and Moving Around Current Status (T0626) At least 60 percent but less than 80 percent impaired, limited or restricted  Mobility: Walking and Moving Around Goal Status [phone]) At least 1 percent but less than 20 percent impaired, limited or restricted       Problem List Patient Active Problem List   Diagnosis Date Noted  . Gingival abscess 08/26/2016  . Acute right hip pain 08/26/2016  . Seizure disorder ([place]) 10/14/2014  . Drug-induced Parkinson's disease ([place]) 10/14/2014  . Hypothyroidism 09/09/2014  . Azathioprine-induced leukopenia ([place]) 08/25/2014  . Thrombocytopenia due to drugs 08/25/2014  . Hepatitis, autoimmune ([place]) 08/25/2014  . Syncope   . Seizures ([place])   . Sinus arrest   . Pacemaker Medtronic   . Bipolar  1 disorder ([place])     Carney Living PT DPT  10/01/2016, 12:37 PM  [facility] [address] [place], [place], [address] Phone: [phone]   Fax:  [phone]  Name: TIAHNA CURE MRN: [id number] Date of Birth: 02-11-1945

## 2016-10-06 ENCOUNTER — Ambulatory Visit: Payer: Medicare Other | Admitting: Physical Therapy

## 2016-10-06 DIAGNOSIS — M5441 Lumbago with sciatica, right side: Secondary | ICD-10-CM | POA: Diagnosis not present

## 2016-10-06 DIAGNOSIS — R262 Difficulty in walking, not elsewhere classified: Secondary | ICD-10-CM | POA: Diagnosis not present

## 2016-10-06 DIAGNOSIS — M62830 Muscle spasm of back: Secondary | ICD-10-CM | POA: Diagnosis not present

## 2016-10-06 DIAGNOSIS — G8929 Other chronic pain: Secondary | ICD-10-CM | POA: Diagnosis not present

## 2016-10-06 NOTE — Therapy (Addendum)
[place], [place], [address] Phone: [phone]   Fax:  [phone]  Physical Therapy Treatment/ Discharge   Patient Details  Name: Stacey Velazquez MRN: [id number] Date of Birth: 29-Apr-1945 Referring Provider: Erskine Emery PA  Encounter Date: 10/06/2016      PT End of Session - 10/06/16 1305    Visit Number 2   Number of Visits 16   Date for PT Re-Evaluation 11/26/16   Authorization Type medicare traid    PT Start Time 0100   PT Stop Time 0145   PT Time Calculation (min) 45 min      Past Medical History:  Diagnosis Date  . Anxiety   . Autoimmune hepatitis ([place])   . Bipolar 1 disorder ([place])   . GERD (gastroesophageal reflux disease)   . Glaucoma 2003   . Heart disease   . Hepatitis, autoimmune ([place]) 08/25/2014  . Hypotension   . Hypothyroidism   . Pacemaker Medtronic    MRI compatible  . Seizures ([place])    last sz 08/21/14  . Sinus arrest   . Vertigo    bvvp    Past Surgical History:  Procedure Laterality Date  . CATARACT EXTRACTION Left 2008  . KNEE SURGERY Left 1985  . PACEMAKER INSERTION  2014  . PARTIAL HYSTERECTOMY  1979    There were no vitals filed for this visit.      Subjective Assessment - 10/06/16 1303    Subjective 10/10 pain yesterday because I had to go to the doctor and then I had to got to [employer] for a long time.    Currently in Pain? Yes   Pain Score 6    Pain Location Back   Pain Orientation Mid;Lower   Pain Descriptors / Indicators Throbbing   Pain Radiating Towards right buttock   Aggravating Factors  standing and walking prolonged    Pain Relieving Factors rest, lay down                         [facility] Adult PT Treatment/Exercise - 10/06/16 0001      Lumbar Exercises: Stretches   Active Hamstring Stretch Limitations 90/90 x 3 each    Passive Hamstring Stretch Limitations seated hamstring stretch    Lower Trunk Rotation Limitations x10     Pelvic Tilt Limitations x10    Piriformis Stretch Limitations 2x20 seconds      Lumbar Exercises: Supine   Ab Set 10 reps   Clam 10 reps   Clam Limitations yellow band bilateral    Other Supine Lumbar Exercises ball squeeze with Tra contract , yellow band abduction with Tra contract                 PT Education - 10/06/16 1403    Education provided Yes   Education Details HEP   Person(s) Educated Patient   Methods Explanation;Handout   Comprehension Verbalized understanding          PT Short Term Goals - 10/01/16 1218      PT SHORT TERM GOAL #1   Title Patient will demsotrate a good core contraction wihile sitting    Time 4   Period Weeks   Status New     PT SHORT TERM GOAL #2   Title Patient will increase right leg strength to 4/5    Time 4   Period Weeks   Status New     PT SHORT TERM GOAL #  3   Title Patient will ambualte with 300' with cane on the left without pain    Time 4   Period Weeks   Status New           PT Long Term Goals - 10/01/16 1219      PT LONG TERM GOAL #1   Title Patient will be independent with initial HEP    Time 8   Period Weeks   Status New   [place] Date 11/26/16     PT LONG TERM GOAL #2   Title Patient will ambualte 2000' without increased pain in order to go shopping    Time 8   Period Weeks   Status New   [employer] Date 11/26/16     PT LONG TERM GOAL #3   Title Patient will mop without increased lower back pain    Time 8   Period Weeks   Status New               Plan - 10/06/16 1403    Clinical Impression Statement Pt reports severs pain yesterday with prolonged time on feet. Pain today 6/10. Worked on pelvic/hip mobility and Transverse abdominal contractions. Pt reports feeling better at end of treatment. Updated HEP.    PT Next Visit Plan continue with light core strengthening. May benefit from seated exercises with her arms ( shoulder flexion, table press); review  HEP    PT Home Exercise Plan  Transverse abdominal with breathing, ball squeee, piriformis stretch, massage roller to thigh, hamstring stretch seated, yellow band clam with ab draw in, pelvic tilit supine and sitting    Consulted and Agree with Plan of Care Patient      Patient will benefit from skilled therapeutic intervention in order to improve the following deficits and impairments:  Abnormal gait, Decreased strength, Pain, Decreased activity tolerance, Difficulty walking, Decreased range of motion, Obesity, Increased muscle spasms, Decreased knowledge of use of DME  Visit Diagnosis: Chronic bilateral low back pain with right-sided sciatica  Muscle spasm of back  Difficulty in walking, not elsewhere classified     Problem List Patient Active Problem List   Diagnosis Date Noted  . Gingival abscess 08/26/2016  . Acute right hip pain 08/26/2016  . Seizure disorder ([place]) 10/14/2014  . Drug-induced Parkinson's disease ([place]) 10/14/2014  . Hypothyroidism 09/09/2014  . Azathioprine-induced leukopenia ([place]) 08/25/2014  . Thrombocytopenia due to drugs 08/25/2014  . Hepatitis, autoimmune ([place]) 08/25/2014  . Syncope   . Seizures ([place])   . Sinus arrest   . Pacemaker Medtronic   . Bipolar 1 disorder ([place])    PHYSICAL THERAPY DISCHARGE SUMMARY  Visits from Start of Care: 2  Current functional level related to goals / functional outcomes: Unknown the patient did not return  Remaining deficits: Unknown    Education / Equipment: Unknown  Plan: Patient agrees to discharge.  Patient goals were not met. Patient is being discharged due to meeting the stated rehab goals.  ?????      Dorene Ar , [place] 10/06/2016, 2:07 PM  [facility] [address] [place], [place], [address] Phone: [phone]   Fax:  [phone]  Name: Stacey Velazquez MRN: [id number] Date of Birth: 1945/05/31  Carolyne Littles PT DPT  12/06/2016 8:19 PM

## 2016-10-11 ENCOUNTER — Ambulatory Visit: Payer: Medicare Other | Attending: Family Medicine | Admitting: Family Medicine

## 2016-10-11 ENCOUNTER — Encounter: Payer: Self-pay | Admitting: Family Medicine

## 2016-10-11 VITALS — BP 124/86 | HR 91 | Temp 98.5°F | Ht 69.0 in | Wt 246.8 lb

## 2016-10-11 DIAGNOSIS — E039 Hypothyroidism, unspecified: Secondary | ICD-10-CM | POA: Insufficient documentation

## 2016-10-11 DIAGNOSIS — M25552 Pain in left hip: Secondary | ICD-10-CM | POA: Diagnosis not present

## 2016-10-11 DIAGNOSIS — Z9889 Other specified postprocedural states: Secondary | ICD-10-CM | POA: Insufficient documentation

## 2016-10-11 DIAGNOSIS — Z79899 Other long term (current) drug therapy: Secondary | ICD-10-CM | POA: Insufficient documentation

## 2016-10-11 DIAGNOSIS — G2119 Other drug induced secondary parkinsonism: Secondary | ICD-10-CM | POA: Diagnosis not present

## 2016-10-11 DIAGNOSIS — Z880 Allergy status to penicillin: Secondary | ICD-10-CM | POA: Insufficient documentation

## 2016-10-11 DIAGNOSIS — F319 Bipolar disorder, unspecified: Secondary | ICD-10-CM | POA: Diagnosis not present

## 2016-10-11 DIAGNOSIS — Z888 Allergy status to other drugs, medicaments and biological substances status: Secondary | ICD-10-CM | POA: Insufficient documentation

## 2016-10-11 DIAGNOSIS — Z90710 Acquired absence of both cervix and uterus: Secondary | ICD-10-CM | POA: Insufficient documentation

## 2016-10-11 DIAGNOSIS — K754 Autoimmune hepatitis: Secondary | ICD-10-CM | POA: Insufficient documentation

## 2016-10-11 DIAGNOSIS — G40909 Epilepsy, unspecified, not intractable, without status epilepticus: Secondary | ICD-10-CM | POA: Diagnosis not present

## 2016-10-11 NOTE — Progress Notes (Signed)
Subjective:  Patient ID: Stacey Velazquez, female    DOB: 1945-12-26  Age: 71 y.o. MRN: [id number]  CC: Hypothyroidism  HPI [name] is a 71 year old female with a history of seizures, autoimmune hepatitis (followed by GI), hypothyroidism, bipolar disorder, drug induced Parkinson's who presents today to establish care with me. She was previously followed by Dr.Funches.  She complains of bilateral hip pain which is chronic and is rated at 6/10. Right hip pain radiates to her right knee and she is followed by [facility]. Currently takes naproxen and tramadol with improvement in her symptoms and she is also undergoing rehabilitation which is helpful. Pain affects her gait and causes her to ambulate with a cane. Hip x-ray from 08/2016 revealed a normal right hip. Lumbar spine x-ray from 08/2016 revealed thoracolumbar spine scoliosis, diffuse degenerative change, anterolisthesis L4 on L5.  Her last seizure was in 07/2014 and she is doing well on Keppra; closely followed by Dr Leta [facility]).  Her bipolar disorder is managed at [facility]. She denies depression, suicidal ideation or intents, anxiety.  She has been compliant with her levothyroxine for hypothyroidism.  Past Medical History:  Diagnosis Date  . Anxiety   . Autoimmune hepatitis ([place])   . Bipolar 1 disorder ([place])   . GERD (gastroesophageal reflux disease)   . Glaucoma 2003   . Heart disease   . Hepatitis, autoimmune ([place]) 08/25/2014  . Hypotension   . Hypothyroidism   . Pacemaker Medtronic    MRI compatible  . Seizures ([place])    last sz 08/21/14  . Sinus arrest   . Vertigo    bvvp    Past Surgical History:  Procedure Laterality Date  . CATARACT EXTRACTION Left 2008  . KNEE SURGERY Left 1985  . PACEMAKER INSERTION  2014  . PARTIAL HYSTERECTOMY  1979    Allergies  Allergen Reactions  . Ambien [Zolpidem Tartrate] Other (See Comments)    Hallucinations   . Amitriptyline Other  (See Comments)    Stops heart per pt  . Ampicillin Other (See Comments)    Stops heart per pt   . Anaprox [Naproxen Sodium] Other (See Comments)    Doesn't work per pt  . Benadryl [Diphenhydramine Hcl (Sleep)] Other (See Comments)    Rash  . Cetirizine & Related Other (See Comments)    Swelling   . Cortizone-10 [Hydrocortisone] Other (See Comments)    seizures   . Darvon [Propoxyphene] Other (See Comments)    Increased heart rate per pt   . Diazepam Other (See Comments)    Stops heart per pt  . Diflunisal Swelling and Other (See Comments)    Stops heart per pt (Dolobid)  . Duloxetine Other (See Comments)    Reaction to Cymbalta - pt doesn't remember what the reaction was  . Flexeril [Cyclobenzaprine] Other (See Comments)    Seizures   . Lactose Intolerance (Gi) Swelling and Other (See Comments)    cramping  . Lidocaine Other (See Comments)    Seizures  . Meperidine And Related Other (See Comments)    Stops heart rate (reaction to Demerol)  . Metanx [L-Methylfolate-Algae-B12-B6] Other (See Comments)    Pt does not remember reaction  . Metoclopramide Other (See Comments)    Pt does not remember reaction  . Morphine And Related Other (See Comments)    Stops heart per pt   . Nuprin [Ibuprofen] Other (See Comments)    Caused headache  . Oxycodone Other (See Comments)  Hallucinations   . Penicillins Other (See Comments)    Stops heart per pt   . Propoxyphene Other (See Comments)    Slowed heart rate per pt (reaction to Darvocet)  . Ranitidine Other (See Comments)    Pt does not remember reaction  . Vicodin [Hydrocodone-Acetaminophen] Other (See Comments)    Seizures   . Tylenol [Acetaminophen] Rash     Outpatient Medications Prior to Visit  Medication Sig Dispense Refill  . azaTHIOprine (IMURAN) 50 MG tablet Take 50 mg by mouth 3 (three) times daily.    . benztropine (COGENTIN) 1 MG tablet Take 1 mg by mouth 2 (two) times daily.   0  . Cholecalciferol  (VITAMIN D) 1000 UNITS capsule Take 1,000 Units by mouth daily.    . furosemide (LASIX) 20 MG tablet take 1 tablet by mouth once daily 90 tablet 1  . Lactobacillus (ACIDOPHOLUS PO) Take 1 tablet by mouth daily as needed (lactose intolerance).    Marland Kitchen levETIRAcetam (KEPPRA) 500 MG tablet Take 1 tablet (500 mg total) by mouth 2 (two) times daily. 180 tablet 4  . levothyroxine (SYNTHROID, LEVOTHROID) 100 MCG tablet Take 1 tablet (100 mcg total) by mouth daily before breakfast. 30 tablet 2  . LINZESS 290 MCG CAPS capsule   1  . meclizine (ANTIVERT) 12.5 MG tablet Take 1 tablet (12.5 mg total) by mouth 3 (three) times daily as needed for dizziness. 90 tablet 2  . naproxen (NAPROSYN) 500 MG tablet Take 1 tablet (500 mg total) by mouth 2 (two) times daily with a meal. 60 tablet 0  . potassium chloride SA (K-DUR,KLOR-CON) 20 MEQ tablet Take 1 tablet (20 mEq total) by mouth daily. 30 tablet 3  . predniSONE (DELTASONE) 5 MG tablet Take 0.5 tablets by mouth daily.    . traMADol (ULTRAM) 50 MG tablet Take 1 tablet (50 mg total) by mouth every 8 (eight) hours as needed for severe pain. 20 tablet 0  . VRAYLAR 1.5 MG CAPS Take 1 capsule by mouth at bedtime.    . clindamycin (CLEOCIN) 150 MG capsule Take 1 capsule (150 mg total) by mouth 3 (three) times daily. (Patient not taking: Reported on 10/11/2016) 30 capsule 0   No facility-administered medications prior to visit.     ROS Review of Systems  Constitutional: Negative for activity change, appetite change and fatigue.  HENT: Negative for congestion, sinus pressure and sore throat.   Eyes: Negative for visual disturbance.  Respiratory: Negative for cough, chest tightness, shortness of breath and wheezing.   Cardiovascular: Negative for chest pain and palpitations.  Gastrointestinal: Negative for abdominal distention, abdominal pain and constipation.  Endocrine: Negative for polydipsia.  Genitourinary: Negative for dysuria and frequency.  Musculoskeletal:        See hpi  Skin: Negative for rash.  Neurological: Positive for tremors. Negative for light-headedness and numbness.  Hematological: Does not bruise/bleed easily.  Psychiatric/Behavioral: Negative for agitation and behavioral problems.    Objective:  BP 124/86   Pulse 91   Temp 98.5 F (36.9 C) (Oral)   Ht 5' 9"  (1.753 m)   Wt 246 lb 12.8 oz (111.9 kg)   SpO2 96%   BMI 36.45 kg/m   BP/Weight 10/11/2016 6/64/4034 07/28/2593  Systolic BP 638 - 756  Diastolic BP 86 - 74  Wt. (Lbs) 246.8 245 245.8  BMI 36.45 36.18 36.3     Physical Exam  Constitutional: She is oriented to person, place, and time. She appears well-developed and well-nourished.  Cardiovascular: Normal  rate, normal heart sounds and intact distal pulses.   No murmur heard. Pulmonary/Chest: Effort normal and breath sounds normal. She has no wheezes. She has no rales. She exhibits no tenderness.  Abdominal: Soft. Bowel sounds are normal. She exhibits no distension and no mass. There is no tenderness.  Musculoskeletal: She exhibits tenderness (tenderness on palpation of lateral aspect of L hip and on range of motion).  Neurological: She is alert and oriented to person, place, and time. She displays tremor.  Ambulates with the aid of cane.  Skin: Skin is warm and dry.  Psychiatric: She has a normal mood and affect.     CMP Latest Ref Rng & Units 02/24/2015 09/25/2014 09/09/2014  Glucose 65 - 99 mg/dL - 86 87  BUN 7 - 25 mg/dL - 10 7  Creatinine 0.60 - 0.93 mg/dL - 0.83 0.80  Sodium 135 - 146 mmol/L - 135 136  Potassium 3.5 - 5.3 mmol/L - 4.2 4.2  Chloride 98 - 110 mmol/L - 102 103  CO2 20 - 31 mmol/L - 25 24  Calcium mg/dL 8.6 9.0 8.7  Total Protein 6.1 - 8.1 g/dL - 8.0 -  Total Bilirubin 0.2 - 1.2 mg/dL - 0.5 -  Alkaline Phos 33 - 130 U/L - 99 -  AST U/L 25 49(H) -  ALT - 29 39(H) -    Lab Results  Component Value Date   TSH 1.20 03/01/2016     Assessment & Plan:   1. Hypothyroidism, unspecified  type Controlled Refill levothyroxine after TSH is obtained - TSH  2. Seizure disorder ([place]) Last seizure was in 07/2014 Continue Keppra  3. Pain of left hip joint Currently on tramadol and naproxen Fall precautions Management as per [facility]  4. Hepatitis, autoimmune ([place]) Management as per GI - CMP14+EGFR  5. High risk medication use - CBC with Differential/Platelet  6. Bipolar disorder  Continue Vraylar, and Congentin Followed by [facility]ry at [facility]  No orders of the defined types were placed in this encounter.   Follow-up: Return in about 3 months (around 01/10/2017), or if symptoms worsen or fail to improve, for Follow-up of hypothyroidism.   Arnoldo Morale MD

## 2016-10-11 NOTE — Patient Instructions (Signed)
Hip Pain The hip is the joint between the upper legs and the lower pelvis. The bones, cartilage, tendons, and muscles of your hip joint support your body and allow you to move around. Hip pain can range from a minor ache to severe pain in one or both of your hips. The pain may be felt on the inside of the hip joint near the groin, or the outside near the buttocks and upper thigh. You may also have swelling or stiffness. Follow these instructions at home: Managing pain, stiffness, and swelling   If directed, apply ice to the injured area.  Put ice in a plastic bag.  Place a towel between your skin and the bag.  Leave the ice on for 20 minutes, 2-3 times a day  Sleep with a pillow between your legs on your most comfortable side.  Avoid any activities that cause pain. General instructions   Take over-the-counter and prescription medicines only as told by your health care provider.  Do any exercises as told by your health care provider.  Record the following:  How often you have hip pain.  The location of your pain.  What the pain feels like.  What makes the pain worse.  Keep all follow-up visits as told by your health care provider. This is important. Contact a health care provider if:  You cannot put weight on your leg.  Your pain or swelling continues or gets worse after one week.  It gets harder to walk.  You have a fever. Get help right away if:  You fall.  You have a sudden increase in pain and swelling in your hip.  Your hip is red or swollen or very tender to touch. Summary  Hip pain can range from a minor ache to severe pain in one or both of your hips.  The pain may be felt on the inside of the hip joint near the groin, or the outside near the buttocks and upper thigh.  Avoid any activities that cause pain.  Record how often you have hip pain, the location of the pain, what makes it worse and what it feels like. This information is not intended to  replace advice given to you by your health care provider. Make sure you discuss any questions you have with your health care provider. Document Released: 07/01/2009 Document Revised: 12/15/2015 Document Reviewed: 12/15/2015 Elsevier Interactive Patient Education  2017 Elsevier Inc.  

## 2016-10-12 ENCOUNTER — Other Ambulatory Visit: Payer: Self-pay | Admitting: Family Medicine

## 2016-10-12 ENCOUNTER — Telehealth: Payer: Self-pay

## 2016-10-12 DIAGNOSIS — E039 Hypothyroidism, unspecified: Secondary | ICD-10-CM

## 2016-10-12 LAB — CBC WITH DIFFERENTIAL/PLATELET
Basophils Absolute: 0 10*3/uL (ref 0.0–0.2)
Basos: 0 %
EOS (ABSOLUTE): 0.1 10*3/uL (ref 0.0–0.4)
Eos: 2 %
Hematocrit: 43.6 % (ref 34.0–46.6)
Hemoglobin: 15 g/dL (ref 11.1–15.9)
Immature Grans (Abs): 0 10*3/uL (ref 0.0–0.1)
Immature Granulocytes: 0 %
Lymphocytes Absolute: 1.5 10*3/uL (ref 0.7–3.1)
Lymphs: 35 %
MCH: 33 pg (ref 26.6–33.0)
MCHC: 34.4 g/dL (ref 31.5–35.7)
MCV: 96 fL (ref 79–97)
Monocytes Absolute: 0.4 10*3/uL (ref 0.1–0.9)
Monocytes: 11 %
Neutrophils Absolute: 2.2 10*3/uL (ref 1.4–7.0)
Neutrophils: 52 %
Platelets: 99 10*3/uL — CL (ref 150–379)
RBC: 4.54 x10E6/uL (ref 3.77–5.28)
RDW: 14.8 % (ref 12.3–15.4)
WBC: 4.2 10*3/uL (ref 3.4–10.8)

## 2016-10-12 LAB — CMP14+EGFR
ALT: 20 IU/L (ref 0–32)
AST: 33 IU/L (ref 0–40)
Albumin/Globulin Ratio: 1.1 — ABNORMAL LOW (ref 1.2–2.2)
Albumin: 3.8 g/dL (ref 3.5–4.8)
Alkaline Phosphatase: 64 IU/L (ref 39–117)
BUN/Creatinine Ratio: 15 (ref 12–28)
BUN: 12 mg/dL (ref 8–27)
Bilirubin Total: 0.6 mg/dL (ref 0.0–1.2)
CO2: 21 mmol/L (ref 20–29)
Calcium: 8.9 mg/dL (ref 8.7–10.3)
Chloride: 104 mmol/L (ref 96–106)
Creatinine, Ser: 0.79 mg/dL (ref 0.57–1.00)
GFR calc Af Amer: 86 mL/min/{1.73_m2} (ref >59)
GFR calc non Af Amer: 75 mL/min/{1.73_m2} (ref >59)
Globulin, Total: 3.4 g/dL (ref 1.5–4.5)
Glucose: 86 mg/dL (ref 65–99)
Potassium: 4.2 mmol/L (ref 3.5–5.2)
Sodium: 139 mmol/L (ref 134–144)
Total Protein: 7.2 g/dL (ref 6.0–8.5)

## 2016-10-12 LAB — TSH: TSH: 1.48 u[IU]/mL (ref 0.450–4.500)

## 2016-10-12 MED ORDER — LEVOTHYROXINE SODIUM 100 MCG PO TABS: 100.0000 ug | ORAL_TABLET | Freq: Every day | ORAL | 2 refills | Status: DC

## 2016-10-12 NOTE — Telephone Encounter (Signed)
Pt was called and informed of lab results. 

## 2016-10-14 ENCOUNTER — Ambulatory Visit (INDEPENDENT_AMBULATORY_CARE_PROVIDER_SITE_OTHER): Payer: Medicare Other | Admitting: Physician Assistant

## 2016-10-14 DIAGNOSIS — F319 Bipolar disorder, unspecified: Secondary | ICD-10-CM | POA: Diagnosis not present

## 2016-10-18 ENCOUNTER — Ambulatory Visit (INDEPENDENT_AMBULATORY_CARE_PROVIDER_SITE_OTHER): Payer: Medicare Other | Admitting: Physician Assistant

## 2016-10-18 DIAGNOSIS — M545 Low back pain, unspecified: Secondary | ICD-10-CM | POA: Insufficient documentation

## 2016-10-18 DIAGNOSIS — M7061 Trochanteric bursitis, right hip: Secondary | ICD-10-CM | POA: Diagnosis not present

## 2016-10-18 NOTE — Progress Notes (Signed)
Office Visit Note   Patient: Stacey Velazquez           Date of Birth: 1945-05-21           MRN: [id number] Visit Date: 10/18/2016              Requested by: Arnoldo Morale, MD [place], [place] [address] PCP: Arnoldo Morale, MD   Assessment & Plan: Visit Diagnoses:  1. Acute midline low back pain without sciatica   2. Trochanteric bursitis, right hip     Plan: We'll have her continue the back and hip stretching exercises as shown by therapy. At this point time she does not have transportation to get to physical therapy but she will have transportation in November if she feels she needs to go back to therapy for her back hip or gait and balance training she'll call our office and let us know.  Follow-Up Instructions: No Follow-up on file.   Orders:  No orders of the defined types were placed in this encounter.  No orders of the defined types were placed in this encounter.     Procedures: No procedures performed   Clinical Data: No additional findings.   Subjective: Right hip pain Low back pain  HPI Mrs. Sundquist returns today follow-up of her right hip and low back pain. She reports that yesterday she is rolled out of bed. She did break her fall with a nightstand that was beside the bed. Sounds as if she may be obtained easily lowered herself to the ground falling on her right buttocks. She's been able to ambulate is had no increased pain. She's having no numbness tingling down either leg. She states she still has some back pain and and right hip pain these are much better since going to physical therapy. She has worn a home exercise program which seems to be helping. She's only had 2 visits to physical therapy thus far. Review of Systems Denies loss of consciousness, chest pain shortness breath fevers chills.  Objective: Vital Signs: There were no vitals taken for this visit.  Physical Exam  Constitutional: She is oriented to person, place, and time.  She appears well-developed and well-nourished. No distress.  Cardiovascular: Intact distal pulses.   Pulmonary/Chest: Effort normal.  Neurological: She is alert and oriented to person, place, and time.  Skin: She is not diaphoretic.  Psychiatric: She has a normal mood and affect. Her behavior is normal.    Ortho Exam I'll hips good range of motion without pain. She has tenderness over the right trochanteric bursa region. Slight tenderness over the left trochanteric bursa. Negative straight leg raise bilaterally. 5 out of 5 strengths lower extremities. She's able to bend over touch her toes. She has good extension lumbar spine without pain.  She walks with use of a quad cane in the right hand.  Specialty Comments:  No specialty comments available.  Imaging: No results found.   PMFS History: Patient Active Problem List   Diagnosis Date Noted  . Low back pain 10/18/2016  . Gingival abscess 08/26/2016  . Acute right hip pain 08/26/2016  . Seizure disorder ([place]) 10/14/2014  . Drug-induced Parkinson's disease ([place]) 10/14/2014  . Hypothyroidism 09/09/2014  . Azathioprine-induced leukopenia ([place]) 08/25/2014  . Thrombocytopenia due to drugs 08/25/2014  . Hepatitis, autoimmune ([place]) 08/25/2014  . Syncope   . Seizures ([place])   . Sinus arrest   . Pacemaker Medtronic   . Bipolar 1 disorder ([facility])    Past  Medical History:  Diagnosis Date  . Anxiety   . Autoimmune hepatitis ([place])   . Bipolar 1 disorder ([place])   . GERD (gastroesophageal reflux disease)   . Glaucoma 2003   . Heart disease   . Hepatitis, autoimmune ([place]) 08/25/2014  . Hypotension   . Hypothyroidism   . Pacemaker Medtronic    MRI compatible  . Seizures ([place])    last sz 08/21/14  . Sinus arrest   . Vertigo    bvvp    Family History  Problem Relation Age of Onset  . Heart attack Father   . Heart disease Paternal Uncle   . Heart disease Paternal Grandmother   . Diabetes Paternal Grandmother   . Heart disease Paternal  Grandfather   . Cancer Mother        BREAST  . Breast cancer Mother   . Cancer Maternal Grandmother   . CVA Maternal Grandmother   . Cancer Maternal Grandfather   . CVA Maternal Grandfather   . Diabetes Sister   . Diabetes Brother   . Diabetes Brother   . Stroke Daughter     Past Surgical History:  Procedure Laterality Date  . CATARACT EXTRACTION Left 2008  . KNEE SURGERY Left 1985  . PACEMAKER INSERTION  2014  . PARTIAL HYSTERECTOMY  1979   Social History   Occupational History  . Not on file.   Social History Main Topics  . Smoking status: Former Smoker    Quit date: 03/02/2003  . Smokeless tobacco: Never Used  . Alcohol use No  . Drug use: No  . Sexual activity: Not on file

## 2016-12-07 ENCOUNTER — Telehealth: Payer: Self-pay | Admitting: Family Medicine

## 2016-12-07 DIAGNOSIS — E039 Hypothyroidism, unspecified: Secondary | ICD-10-CM

## 2016-12-07 MED ORDER — LEVOTHYROXINE SODIUM 100 MCG PO TABS: 100.0000 ug | ORAL_TABLET | Freq: Every day | ORAL | 2 refills | Status: DC

## 2016-12-07 NOTE — Addendum Note (Signed)
Addended by: Rica Mast on: 12/07/2016 10:06 AM   Modules accepted: Orders

## 2016-12-07 NOTE — Telephone Encounter (Signed)
Patient called and asked for levothyroxine. Please fu

## 2016-12-07 NOTE — Telephone Encounter (Signed)
Levothyroxine refilled

## 2016-12-13 ENCOUNTER — Ambulatory Visit (INDEPENDENT_AMBULATORY_CARE_PROVIDER_SITE_OTHER): Payer: Medicare Other | Admitting: *Deleted

## 2016-12-13 DIAGNOSIS — K7460 Unspecified cirrhosis of liver: Secondary | ICD-10-CM | POA: Diagnosis not present

## 2016-12-13 DIAGNOSIS — K754 Autoimmune hepatitis: Secondary | ICD-10-CM | POA: Diagnosis not present

## 2016-12-13 DIAGNOSIS — C4441 Basal cell carcinoma of skin of scalp and neck: Secondary | ICD-10-CM | POA: Diagnosis not present

## 2016-12-13 DIAGNOSIS — I495 Sick sinus syndrome: Secondary | ICD-10-CM

## 2016-12-13 NOTE — Progress Notes (Signed)
Remote pacemaker transmission.   

## 2016-12-14 DIAGNOSIS — K754 Autoimmune hepatitis: Secondary | ICD-10-CM | POA: Insufficient documentation

## 2016-12-14 DIAGNOSIS — R42 Dizziness and giddiness: Secondary | ICD-10-CM | POA: Insufficient documentation

## 2016-12-14 DIAGNOSIS — F419 Anxiety disorder, unspecified: Secondary | ICD-10-CM | POA: Insufficient documentation

## 2016-12-14 DIAGNOSIS — I519 Heart disease, unspecified: Secondary | ICD-10-CM | POA: Insufficient documentation

## 2016-12-14 DIAGNOSIS — K219 Gastro-esophageal reflux disease without esophagitis: Secondary | ICD-10-CM | POA: Insufficient documentation

## 2016-12-14 DIAGNOSIS — I959 Hypotension, unspecified: Secondary | ICD-10-CM | POA: Insufficient documentation

## 2016-12-14 LAB — CUP PACEART REMOTE DEVICE CHECK
Battery Remaining Longevity: 70 mo
Battery Voltage: 3 V
Brady Statistic AP VP Percent: 0.02 %
Brady Statistic AP VS Percent: 8.39 %
Brady Statistic AS VP Percent: 0.17 %
Brady Statistic AS VS Percent: 91.41 %
Brady Statistic RA Percent Paced: 8.39 %
Brady Statistic RV Percent Paced: 0.2 %
Date Time Interrogation Session: 20181119140529
Implantable Lead Implant Date: 20141205
Implantable Lead Implant Date: 20141205
Implantable Lead Location: 753859
Implantable Lead Location: 753860
Implantable Lead Model: 5076
Implantable Lead Model: 5076
Implantable Pulse Generator Implant Date: 20141205
Lead Channel Impedance Value: 380 Ohm
Lead Channel Impedance Value: 399 Ohm
Lead Channel Impedance Value: 494 Ohm
Lead Channel Impedance Value: 494 Ohm
Lead Channel Pacing Threshold Amplitude: 0.375 V
Lead Channel Pacing Threshold Amplitude: 0.875 V
Lead Channel Pacing Threshold Pulse Width: 0.4 ms
Lead Channel Pacing Threshold Pulse Width: 0.4 ms
Lead Channel Sensing Intrinsic Amplitude: 1.75 mV
Lead Channel Sensing Intrinsic Amplitude: 1.75 mV
Lead Channel Sensing Intrinsic Amplitude: 7.125 mV
Lead Channel Sensing Intrinsic Amplitude: 7.125 mV
Lead Channel Setting Pacing Amplitude: 2 V
Lead Channel Setting Pacing Amplitude: 2.5 V
Lead Channel Setting Pacing Pulse Width: 0.4 ms
Lead Channel Setting Sensing Sensitivity: 0.6 mV

## 2016-12-23 ENCOUNTER — Encounter: Payer: Self-pay | Admitting: Cardiology

## 2016-12-24 ENCOUNTER — Ambulatory Visit: Payer: Medicare Other | Admitting: Internal Medicine

## 2016-12-27 ENCOUNTER — Encounter: Payer: Self-pay | Admitting: Internal Medicine

## 2016-12-27 ENCOUNTER — Ambulatory Visit (INDEPENDENT_AMBULATORY_CARE_PROVIDER_SITE_OTHER): Payer: Medicare Other | Admitting: Internal Medicine

## 2016-12-27 VITALS — BP 128/86 | HR 97 | Ht 69.0 in | Wt 246.0 lb

## 2016-12-27 DIAGNOSIS — I495 Sick sinus syndrome: Secondary | ICD-10-CM

## 2016-12-27 DIAGNOSIS — Z950 Presence of cardiac pacemaker: Secondary | ICD-10-CM

## 2016-12-27 DIAGNOSIS — I429 Cardiomyopathy, unspecified: Secondary | ICD-10-CM | POA: Diagnosis not present

## 2016-12-27 NOTE — Progress Notes (Signed)
Patient Care Team: Arnoldo Morale, MD as PCP - General (Family Medicine) [name], MD as Consulting Physician (Cardiology)   HPI  Stacey Velazquez is a 71 y.o. female Seen in pacemaker followup. Medtronic was implanted 12/14 for sinus arrest and syncope in [place]   She has no known coronary artery disease.  She has dyspnea on exertion particularly with bending.  She has some problems with peripheral edema.  She does not have nocturnal dyspnea.  She denies chest pain.  She lives in a group home. She has 2 daughters.   LV EF was normal 7/16 Myoview scan 12/17 normal ejection fraction apical thinning   Date Cr K  9/18 0.79 4.2           Past Medical History:  Diagnosis Date  . Anxiety   . Autoimmune hepatitis ([place])   . Bipolar 1 disorder ([place])   . GERD (gastroesophageal reflux disease)   . Glaucoma 2003   . Heart disease   . Hepatitis, autoimmune ([place]) 08/25/2014  . Hypotension   . Hypothyroidism   . Pacemaker Medtronic    MRI compatible  . Seizures ([place])    last sz 08/21/14  . Sinus arrest   . Vertigo    bvvp    Past Surgical History:  Procedure Laterality Date  . CATARACT EXTRACTION Left 2008  . KNEE SURGERY Left 1985  . PACEMAKER INSERTION  2014  . PARTIAL HYSTERECTOMY  1979    Current Outpatient Medications  Medication Sig Dispense Refill  . azaTHIOprine (IMURAN) 50 MG tablet Take 50 mg by mouth 3 (three) times daily.    . benztropine (COGENTIN) 1 MG tablet Take 1 mg by mouth 2 (two) times daily.   0  . Cholecalciferol (VITAMIN D) 1000 UNITS capsule Take 1,000 Units by mouth daily.    . clindamycin (CLEOCIN) 150 MG capsule Take 1 capsule (150 mg total) by mouth 3 (three) times daily. 30 capsule 0  . furosemide (LASIX) 20 MG tablet take 1 tablet by mouth once daily 90 tablet 1  . Lactobacillus (ACIDOPHOLUS PO) Take 1 tablet by mouth daily as needed (lactose intolerance).    Marland Kitchen levETIRAcetam (KEPPRA) 500 MG tablet Take 1 tablet (500 mg total) by  mouth 2 (two) times daily. 180 tablet 4  . levothyroxine (SYNTHROID, LEVOTHROID) 100 MCG tablet Take 1 tablet (100 mcg total) daily before breakfast by mouth. 30 tablet 2  . LINZESS 290 MCG CAPS capsule   1  . meclizine (ANTIVERT) 12.5 MG tablet Take 1 tablet (12.5 mg total) by mouth 3 (three) times daily as needed for dizziness. 90 tablet 2  . naproxen (NAPROSYN) 500 MG tablet Take 1 tablet (500 mg total) by mouth 2 (two) times daily with a meal. 60 tablet 0  . potassium chloride SA (K-DUR,KLOR-CON) 20 MEQ tablet Take 1 tablet (20 mEq total) by mouth daily. 30 tablet 3  . predniSONE (DELTASONE) 5 MG tablet Take 0.5 tablets by mouth daily.    . traMADol (ULTRAM) 50 MG tablet Take 1 tablet (50 mg total) by mouth every 8 (eight) hours as needed for severe pain. 20 tablet 0  . VRAYLAR 1.5 MG CAPS Take 1 capsule by mouth at bedtime.     No current facility-administered medications for this visit.     Allergies  Allergen Reactions  . Ambien [Zolpidem Tartrate] Other (See Comments)    Hallucinations   . Amitriptyline Other (See Comments)    Stops heart per pt  .  Ampicillin Other (See Comments)    Stops heart per pt   . Anaprox [Naproxen Sodium] Other (See Comments)    Doesn't work per pt  . Benadryl [Diphenhydramine Hcl (Sleep)] Other (See Comments)    Rash  . Cetirizine & Related Other (See Comments)    Swelling   . Cortizone-10 [Hydrocortisone] Other (See Comments)    seizures   . Darvon [Propoxyphene] Other (See Comments)    Increased heart rate per pt   . Diazepam Other (See Comments)    Stops heart per pt  . Diflunisal Swelling and Other (See Comments)    Stops heart per pt (Dolobid)  . Duloxetine Other (See Comments)    Reaction to Cymbalta - pt doesn't remember what the reaction was  . Flexeril [Cyclobenzaprine] Other (See Comments)    Seizures   . Lactose Intolerance (Gi) Swelling and Other (See Comments)    cramping  . Lidocaine Other (See Comments)    Seizures  .  Meperidine And Related Other (See Comments)    Stops heart rate (reaction to Demerol)  . Metanx [L-Methylfolate-Algae-B12-B6] Other (See Comments)    Pt does not remember reaction  . Metoclopramide Other (See Comments)    Pt does not remember reaction  . Morphine And Related Other (See Comments)    Stops heart per pt   . Nuprin [Ibuprofen] Other (See Comments)    Caused headache  . Oxycodone Other (See Comments)    Hallucinations   . Penicillins Other (See Comments)    Stops heart per pt   . Propoxyphene Other (See Comments)    Slowed heart rate per pt (reaction to Darvocet)  . Ranitidine Other (See Comments)    Pt does not remember reaction  . Vicodin [Hydrocodone-Acetaminophen] Other (See Comments)    Seizures   . Tylenol [Acetaminophen] Rash    Review of Systems negative except from HPI and PMH  Physical Exam BP 128/86   Pulse 97   Ht 5\' 9"  (1.753 m)   Wt 246 lb (111.6 kg)   BMI 36.33 kg/m  Well developed and well nourished in no acute distress HENT normal E scleral and icterus clear Neck Supple JVP flat  carotids brisk and full Clear to ausculation Device pocket well healed; without hematoma or erythema.  There is no tethering Regular rate and rhythm, no murmurs gallops or rub Soft with active bowel sounds No clubbing cyanosis trace Edema Alert and oriented, grossly normal motor and sensory function Skin Warm and Dry  ECG at 97 Interval 17/08/35  axis left -31 Inferior Q waves Poor R wave progression Low voltage   Personally reviewed unchanged from 2016  Assessment and  Plan  Sinus node dysfunction  Pacemaker-Medtronic  The patient's device was interrogated.  The information was reviewed. No changes were made in the programming.      Chest pain typical and atypical features -  Low voltage ECG  HFpEF  Thrombocytopenia   Device function is normal.  She paces infrequently.  Dyspnea on exertion associated with volume overload in the context of  normal LV function likely related to diastolic heart failure.  We will increase her diuretics gently to 40 mg daily for 3 days and then resume her 20 mg.  ECG strikingly abnormal suggestive of infiltrative cardiomyopathy based on voltage and Q waves.  We will undertake an echocardiogram to look for further evidence of diastolic heart failure in addition.  She has thrombocytopenia.  This occurs in the context of autoimmune hepatitis.  I do not think is related to her cardiac condition.

## 2016-12-27 NOTE — Patient Instructions (Addendum)
Medication Instructions:  Your physician has recommended you make the following change in your medication:  1. INCREASE LASIX to 40 mg daily for three days, then reduce and take 20 mg daily  *If you need a refill on your cardiac medications before your next appointment, please call your pharmacy*  Labwork: None ordered  Testing/Procedures: Your physician has requested that you have an echocardiogram. Echocardiography is a painless test that uses sound waves to create images of your heart. It provides your doctor with information about the size and shape of your heart and how well your heart's chambers and valves are working. This procedure takes approximately one hour. There are no restrictions for this procedure.  Follow-Up: Remote monitoring is used to monitor your Pacemaker or ICD from home. This monitoring reduces the number of office visits required to check your device to one time per year. It allows [place] to keep an eye on the functioning of your device to ensure it is working properly. You are scheduled for a device check from home on 03/14/2017. You may send your transmission at any time that day. If you have a wireless device, the transmission will be sent automatically. After your physician reviews your transmission, you will receive a postcard with your next transmission date.  Your physician wants you to follow-up in: 1 year with Chanetta Marshall, NP/Renee Charlcie C[place], [place] (someone from Dr. Olin Pia care team)   You will receive a reminder letter in the mail two months in advance. If you don't receive a letter, please call our office to schedule the follow-up appointment.  Thank you for choosing [facility]!!    Any Other Special Instructions Will Be Listed Below (If Applicable).

## 2016-12-28 ENCOUNTER — Other Ambulatory Visit: Payer: Self-pay | Admitting: *Deleted

## 2016-12-28 MED ORDER — FUROSEMIDE 20 MG PO TABS: ORAL_TABLET | ORAL | 3 refills | Status: DC

## 2016-12-28 NOTE — Telephone Encounter (Signed)
Patient called and stated that she saw Dr Caryl Comes for an office visit yesterday and he instructed her to take two tablets of the furosemide for the next three days and then resume her dose of one tablet daily. She went to the pharmacy yesterday to pick this up but they did not receive an order. She called them today but they informed her that they still did not receive the order. She does not have any medication on hand. She is aware that I will send this in for her. Patient verbalized her understanding and was very appreciative.

## 2016-12-30 DIAGNOSIS — F319 Bipolar disorder, unspecified: Secondary | ICD-10-CM | POA: Diagnosis not present

## 2017-01-07 ENCOUNTER — Other Ambulatory Visit: Payer: Self-pay

## 2017-01-07 ENCOUNTER — Ambulatory Visit (HOSPITAL_COMMUNITY): Payer: Medicare Other | Attending: Internal Medicine

## 2017-01-07 DIAGNOSIS — I429 Cardiomyopathy, unspecified: Secondary | ICD-10-CM

## 2017-01-07 DIAGNOSIS — E669 Obesity, unspecified: Secondary | ICD-10-CM | POA: Diagnosis not present

## 2017-01-07 DIAGNOSIS — R0609 Other forms of dyspnea: Secondary | ICD-10-CM | POA: Insufficient documentation

## 2017-01-07 DIAGNOSIS — R600 Localized edema: Secondary | ICD-10-CM | POA: Diagnosis not present

## 2017-01-07 DIAGNOSIS — I495 Sick sinus syndrome: Secondary | ICD-10-CM | POA: Insufficient documentation

## 2017-01-10 ENCOUNTER — Ambulatory Visit: Payer: Medicare Other | Admitting: Family Medicine

## 2017-01-11 ENCOUNTER — Telehealth: Payer: Self-pay

## 2017-01-11 NOTE — Telephone Encounter (Signed)
Called patient to go over echo results. Patient has a voicemail that is not set up. Will attempt to contact later.

## 2017-01-12 ENCOUNTER — Ambulatory Visit: Payer: Medicare Other | Attending: Family Medicine | Admitting: Family Medicine

## 2017-01-12 ENCOUNTER — Encounter: Payer: Self-pay | Admitting: Family Medicine

## 2017-01-12 VITALS — BP 130/86 | HR 87 | Temp 97.5°F | Ht 69.0 in | Wt 247.8 lb

## 2017-01-12 DIAGNOSIS — Z880 Allergy status to penicillin: Secondary | ICD-10-CM | POA: Insufficient documentation

## 2017-01-12 DIAGNOSIS — Z23 Encounter for immunization: Secondary | ICD-10-CM | POA: Diagnosis not present

## 2017-01-12 DIAGNOSIS — I495 Sick sinus syndrome: Secondary | ICD-10-CM | POA: Insufficient documentation

## 2017-01-12 DIAGNOSIS — Z950 Presence of cardiac pacemaker: Secondary | ICD-10-CM | POA: Diagnosis not present

## 2017-01-12 DIAGNOSIS — M545 Low back pain, unspecified: Secondary | ICD-10-CM

## 2017-01-12 DIAGNOSIS — F419 Anxiety disorder, unspecified: Secondary | ICD-10-CM | POA: Diagnosis not present

## 2017-01-12 DIAGNOSIS — F319 Bipolar disorder, unspecified: Secondary | ICD-10-CM | POA: Insufficient documentation

## 2017-01-12 DIAGNOSIS — G8929 Other chronic pain: Secondary | ICD-10-CM | POA: Diagnosis not present

## 2017-01-12 DIAGNOSIS — G40909 Epilepsy, unspecified, not intractable, without status epilepticus: Secondary | ICD-10-CM

## 2017-01-12 DIAGNOSIS — Z79899 Other long term (current) drug therapy: Secondary | ICD-10-CM | POA: Insufficient documentation

## 2017-01-12 DIAGNOSIS — K754 Autoimmune hepatitis: Secondary | ICD-10-CM

## 2017-01-12 DIAGNOSIS — E039 Hypothyroidism, unspecified: Secondary | ICD-10-CM

## 2017-01-12 DIAGNOSIS — R55 Syncope and collapse: Secondary | ICD-10-CM | POA: Diagnosis not present

## 2017-01-12 DIAGNOSIS — K219 Gastro-esophageal reflux disease without esophagitis: Secondary | ICD-10-CM | POA: Insufficient documentation

## 2017-01-12 MED ORDER — LEVOTHYROXINE SODIUM 100 MCG PO TABS: 100.0000 ug | ORAL_TABLET | Freq: Every day | ORAL | 2 refills | Status: DC

## 2017-01-12 MED ORDER — NAPROXEN 500 MG PO TABS: 500.0000 mg | ORAL_TABLET | Freq: Two times a day (BID) | ORAL | 2 refills | Status: DC

## 2017-01-12 NOTE — Patient Instructions (Signed)

## 2017-01-13 NOTE — Progress Notes (Signed)
Subjective:  Patient ID: Stacey Velazquez, female    DOB: 1945-03-08  Age: 71 y.o. MRN: [id number]  CC: Hypothyroidism   HPI Stacey Velazquez is a 71 year old female with a history of seizures, autoimmune hepatitis (followed by GI), hypothyroidism, bipolar disorder, drug induced Parkinson's, sinus node dysfunction s/p medtronic pacemaker in 12/2012(after syncope) who presents today for a follow up visit.  Her back pain has been acting up ever since the snow; it was previously controlled on Naproxen. Pain is in lower back, 8/10 at this time and does not radiate. Seen by [facility] in 09/2016 for same and informs me corticosteroid injections were not an option due to her history of seizures. She has completed her course of PT which was helpful and continues to perform home exercises.  Seen earlier this month by Dr Caryl Comes for Pacemaker follow up and Lasix. dose was increased short term due to dyspnea; follow up in 1 year Echo from 01/07/17 revealed EF 50-55%, grade 1DD  Autoimmune hepatitis is managed by GI - Dr Benson [place].  Her last seizure was in 07/2014. She receives [facility] health care at [facility].  Past Medical History:  Diagnosis Date  . Anxiety   . Autoimmune hepatitis ([place])   . Bipolar 1 disorder ([place])   . GERD (gastroesophageal reflux disease)   . Glaucoma 2003   . Heart disease   . Hepatitis, autoimmune ([place]) 08/25/2014  . Hypotension   . Hypothyroidism   . Pacemaker Medtronic    MRI compatible  . Seizures ([place])    last sz 08/21/14  . Sinus arrest   . Vertigo    bvvp    Past Surgical History:  Procedure Laterality Date  . CATARACT EXTRACTION Left 2008  . KNEE SURGERY Left 1985  . PACEMAKER INSERTION  2014  . PARTIAL HYSTERECTOMY  1979    Allergies  Allergen Reactions  . Ambien [Zolpidem Tartrate] Other (See Comments)    Hallucinations   . Amitriptyline Other (See Comments)    Stops heart per pt  . Ampicillin Other (See Comments)    Stops heart per pt   .  Anaprox [Naproxen Sodium] Other (See Comments)    Doesn't work per pt  . Benadryl [Diphenhydramine Hcl (Sleep)] Other (See Comments)    Rash  . Cetirizine & Related Other (See Comments)    Swelling   . Cortizone-10 [Hydrocortisone] Other (See Comments)    seizures   . Darvon [Propoxyphene] Other (See Comments)    Increased heart rate per pt   . Diazepam Other (See Comments)    Stops heart per pt  . Diflunisal Swelling and Other (See Comments)    Stops heart per pt (Dolobid)  . Duloxetine Other (See Comments)    Reaction to Cymbalta - pt doesn't remember what the reaction was  . Flexeril [Cyclobenzaprine] Other (See Comments)    Seizures   . Lactose Intolerance (Gi) Swelling and Other (See Comments)    cramping  . Lidocaine Other (See Comments)    Seizures  . Meperidine And Related Other (See Comments)    Stops heart rate (reaction to Demerol)  . Metanx [L-Methylfolate-Algae-B12-B6] Other (See Comments)    Pt does not remember reaction  . Metoclopramide Other (See Comments)    Pt does not remember reaction  . Morphine And Related Other (See Comments)    Stops heart per pt   . Nuprin [Ibuprofen] Other (See Comments)    Caused headache  . Oxycodone Other (See Comments)  Hallucinations   . Penicillins Other (See Comments)    Stops heart per pt   . Propoxyphene Other (See Comments)    Slowed heart rate per pt (reaction to Darvocet)  . Ranitidine Other (See Comments)    Pt does not remember reaction  . Vicodin [Hydrocodone-Acetaminophen] Other (See Comments)    Seizures   . Tylenol [Acetaminophen] Rash     Outpatient Medications Prior to Visit  Medication Sig Dispense Refill  . azaTHIOprine (IMURAN) 50 MG tablet Take 50 mg by mouth 3 (three) times daily.    . benztropine (COGENTIN) 1 MG tablet Take 1 mg by mouth 2 (two) times daily.   0  . Cholecalciferol (VITAMIN D) 1000 UNITS capsule Take 1,000 Units by mouth daily.    . clindamycin (CLEOCIN) 150 MG capsule  Take 1 capsule (150 mg total) by mouth 3 (three) times daily. 30 capsule 0  . furosemide (LASIX) 20 MG tablet Take two tablets(40 mg) by mouth for three days then resume taking one tablet (20 mg ) by mouth daily. 93 tablet 3  . Lactobacillus (ACIDOPHOLUS PO) Take 1 tablet by mouth daily as needed (lactose intolerance).    Marland Kitchen levETIRAcetam (KEPPRA) 500 MG tablet Take 1 tablet (500 mg total) by mouth 2 (two) times daily. 180 tablet 4  . LINZESS 290 MCG CAPS capsule   1  . meclizine (ANTIVERT) 12.5 MG tablet Take 1 tablet (12.5 mg total) by mouth 3 (three) times daily as needed for dizziness. 90 tablet 2  . potassium chloride SA (K-DUR,KLOR-CON) 20 MEQ tablet Take 1 tablet (20 mEq total) by mouth daily. 30 tablet 3  . predniSONE (DELTASONE) 5 MG tablet Take 0.5 tablets by mouth daily.    . traMADol (ULTRAM) 50 MG tablet Take 1 tablet (50 mg total) by mouth every 8 (eight) hours as needed for severe pain. 20 tablet 0  . VRAYLAR 1.5 MG CAPS Take 1 capsule by mouth at bedtime.    Marland Kitchen levothyroxine (SYNTHROID, LEVOTHROID) 100 MCG tablet Take 1 tablet (100 mcg total) daily before breakfast by mouth. 30 tablet 2  . naproxen (NAPROSYN) 500 MG tablet Take 1 tablet (500 mg total) by mouth 2 (two) times daily with a meal. 60 tablet 0   No facility-administered medications prior to visit.     ROS Review of Systems  Constitutional: Negative for activity change, appetite change and fatigue.  HENT: Negative for congestion, sinus pressure and sore throat.   Eyes: Negative for visual disturbance.  Respiratory: Negative for cough, chest tightness, shortness of breath and wheezing.   Cardiovascular: Negative for chest pain and palpitations.  Gastrointestinal: Negative for abdominal distention, abdominal pain and constipation.  Endocrine: Negative for polydipsia.  Genitourinary: Negative for dysuria and frequency.  Musculoskeletal: Positive for back pain. Negative for arthralgias.  Skin: Negative for rash.    Neurological: Negative for tremors, light-headedness and numbness.  Hematological: Does not bruise/bleed easily.  Psychiatric/Behavioral: Negative for agitation and behavioral problems.    Objective:  BP 130/86   Pulse 87   Temp (!) 97.5 F (36.4 C) (Oral)   Ht 5\' 9"  (1.753 m)   Wt 247 lb 12.8 oz (112.4 kg)   SpO2 95%   BMI 36.59 kg/m   BP/Weight 01/12/2017 12/27/2016 5/85/2778  Systolic BP 242 353 614  Diastolic BP 86 86 86  Wt. (Lbs) 247.8 246 246.8  BMI 36.59 36.33 36.45      Physical Exam  Constitutional: She is oriented to person, place, and time. She  appears well-developed and well-nourished.  Cardiovascular: Normal rate, normal heart sounds and intact distal pulses.  No murmur heard. Pulmonary/Chest: Effort normal and breath sounds normal. She has no wheezes. She has no rales. She exhibits no tenderness.  Abdominal: Soft. Bowel sounds are normal. She exhibits no distension and no mass. There is no tenderness.  Musculoskeletal: Normal range of motion. She exhibits tenderness (slight lumbar TTP).  Neurological: She is alert and oriented to person, place, and time. Coordination (tremors) abnormal.  Skin: Skin is warm and dry.  Psychiatric: She has a normal mood and affect.     Lab Results  Component Value Date   TSH 1.480 10/11/2016    Assessment & Plan:   1. Hypothyroidism, unspecified type Controlled - levothyroxine (SYNTHROID, LEVOTHROID) 100 MCG tablet; Take 1 tablet (100 mcg total) by mouth daily before breakfast.  Dispense: 30 tablet; Refill: 2  2. Need for influenza vaccination - Flu Vaccine QUAD 36+ mos IM  3. Seizure disorder ([place]) No seizures in the last two and a half years Followed by Neurology  4. Chronic midline low back pain without sciatica Uncontrolled Recently triggered by cold weather Completed PT Symptoms are somewhat bearable with Naproxen - naproxen (NAPROSYN) 500 MG tablet; Take 1 tablet (500 mg total) by mouth 2 (two) times  daily with a meal.  Dispense: 60 tablet; Refill: 2  5. Autoimmune hepatitis ([place]) Currently on Imuran   Meds ordered this encounter  Medications  . naproxen (NAPROSYN) 500 MG tablet    Sig: Take 1 tablet (500 mg total) by mouth 2 (two) times daily with a meal.    Dispense:  60 tablet    Refill:  2  . levothyroxine (SYNTHROID, LEVOTHROID) 100 MCG tablet    Sig: Take 1 tablet (100 mcg total) by mouth daily before breakfast.    Dispense:  30 tablet    Refill:  2    Follow-up: Return in about 3 months (around 04/12/2017) for follow up of chronic medical conditions.   Arnoldo Morale MD

## 2017-01-22 ENCOUNTER — Other Ambulatory Visit: Payer: Self-pay | Admitting: Diagnostic Neuroimaging

## 2017-01-22 DIAGNOSIS — R569 Unspecified convulsions: Secondary | ICD-10-CM

## 2017-02-11 LAB — CUP PACEART INCLINIC DEVICE CHECK
Battery Remaining Longevity: 70 mo
Battery Voltage: 3 V
Brady Statistic AP VP Percent: 0.02 %
Brady Statistic AP VS Percent: 6.15 %
Brady Statistic AS VP Percent: 0.13 %
Brady Statistic AS VS Percent: 93.7 %
Brady Statistic RA Percent Paced: 6.17 %
Brady Statistic RV Percent Paced: 0.15 %
Date Time Interrogation Session: 20181203200714
Implantable Lead Implant Date: 20141205
Implantable Lead Implant Date: 20141205
Implantable Lead Location: 753859
Implantable Lead Location: 753860
Implantable Lead Model: 5076
Implantable Lead Model: 5076
Implantable Pulse Generator Implant Date: 20141205
Lead Channel Impedance Value: 361 Ohm
Lead Channel Impedance Value: 380 Ohm
Lead Channel Impedance Value: 437 Ohm
Lead Channel Impedance Value: 456 Ohm
Lead Channel Pacing Threshold Amplitude: 0.5 V
Lead Channel Pacing Threshold Amplitude: 0.75 V
Lead Channel Pacing Threshold Pulse Width: 0.4 ms
Lead Channel Pacing Threshold Pulse Width: 0.4 ms
Lead Channel Sensing Intrinsic Amplitude: 2 mV
Lead Channel Sensing Intrinsic Amplitude: 7.25 mV
Lead Channel Setting Pacing Amplitude: 2 V
Lead Channel Setting Pacing Amplitude: 2.5 V
Lead Channel Setting Pacing Pulse Width: 0.4 ms
Lead Channel Setting Sensing Sensitivity: 0.6 mV

## 2017-03-14 ENCOUNTER — Ambulatory Visit (INDEPENDENT_AMBULATORY_CARE_PROVIDER_SITE_OTHER): Payer: Medicare Other | Admitting: *Deleted

## 2017-03-14 ENCOUNTER — Telehealth: Payer: Self-pay | Admitting: Cardiology

## 2017-03-14 DIAGNOSIS — I495 Sick sinus syndrome: Secondary | ICD-10-CM

## 2017-03-14 NOTE — Telephone Encounter (Signed)
Spoke with pt and reminded pt of remote transmission that is due today. Pt verbalized understanding.   

## 2017-03-15 DIAGNOSIS — F319 Bipolar disorder, unspecified: Secondary | ICD-10-CM | POA: Diagnosis not present

## 2017-03-15 LAB — CUP PACEART REMOTE DEVICE CHECK
Battery Remaining Longevity: 70 mo
Battery Voltage: 3 V
Brady Statistic AP VP Percent: 0.02 %
Brady Statistic AP VS Percent: 5.69 %
Brady Statistic AS VP Percent: 0.16 %
Brady Statistic AS VS Percent: 94.13 %
Brady Statistic RA Percent Paced: 5.7 %
Brady Statistic RV Percent Paced: 0.18 %
Date Time Interrogation Session: 20190218220139
Implantable Lead Implant Date: 20141205
Implantable Lead Implant Date: 20141205
Implantable Lead Location: 753859
Implantable Lead Location: 753860
Implantable Lead Model: 5076
Implantable Lead Model: 5076
Implantable Pulse Generator Implant Date: 20141205
Lead Channel Impedance Value: 361 Ohm
Lead Channel Impedance Value: 418 Ohm
Lead Channel Impedance Value: 456 Ohm
Lead Channel Impedance Value: 475 Ohm
Lead Channel Pacing Threshold Amplitude: 0.5 V
Lead Channel Pacing Threshold Amplitude: 0.875 V
Lead Channel Pacing Threshold Pulse Width: 0.4 ms
Lead Channel Pacing Threshold Pulse Width: 0.4 ms
Lead Channel Sensing Intrinsic Amplitude: 2.625 mV
Lead Channel Sensing Intrinsic Amplitude: 2.625 mV
Lead Channel Sensing Intrinsic Amplitude: 5.125 mV
Lead Channel Sensing Intrinsic Amplitude: 5.125 mV
Lead Channel Setting Pacing Amplitude: 2 V
Lead Channel Setting Pacing Amplitude: 2.5 V
Lead Channel Setting Pacing Pulse Width: 0.4 ms
Lead Channel Setting Sensing Sensitivity: 0.6 mV

## 2017-03-15 NOTE — Progress Notes (Signed)
Remote pacemaker transmission.   

## 2017-03-16 ENCOUNTER — Encounter: Payer: Self-pay | Admitting: Cardiology

## 2017-03-17 ENCOUNTER — Encounter (HOSPITAL_COMMUNITY): Payer: Self-pay

## 2017-03-17 ENCOUNTER — Emergency Department (HOSPITAL_COMMUNITY): Payer: Medicare Other

## 2017-03-17 ENCOUNTER — Other Ambulatory Visit: Payer: Self-pay

## 2017-03-17 ENCOUNTER — Emergency Department (HOSPITAL_COMMUNITY)
Admission: EM | Admit: 2017-03-17 | Discharge: 2017-03-17 | Disposition: A | Discharge status: Home / Self Care | Payer: Medicare Other | Attending: Emergency Medicine | Admitting: Emergency Medicine

## 2017-03-17 DIAGNOSIS — S60811A Abrasion of right wrist, initial encounter: Secondary | ICD-10-CM | POA: Diagnosis not present

## 2017-03-17 DIAGNOSIS — S4991XA Unspecified injury of right shoulder and upper arm, initial encounter: Secondary | ICD-10-CM | POA: Diagnosis not present

## 2017-03-17 DIAGNOSIS — Z950 Presence of cardiac pacemaker: Secondary | ICD-10-CM | POA: Insufficient documentation

## 2017-03-17 DIAGNOSIS — Z79899 Other long term (current) drug therapy: Secondary | ICD-10-CM | POA: Diagnosis not present

## 2017-03-17 DIAGNOSIS — M79601 Pain in right arm: Secondary | ICD-10-CM | POA: Diagnosis not present

## 2017-03-17 DIAGNOSIS — E039 Hypothyroidism, unspecified: Secondary | ICD-10-CM | POA: Diagnosis not present

## 2017-03-17 DIAGNOSIS — M25531 Pain in right wrist: Secondary | ICD-10-CM | POA: Diagnosis not present

## 2017-03-17 DIAGNOSIS — M25421 Effusion, right elbow: Secondary | ICD-10-CM | POA: Diagnosis not present

## 2017-03-17 DIAGNOSIS — Z87891 Personal history of nicotine dependence: Secondary | ICD-10-CM | POA: Insufficient documentation

## 2017-03-17 DIAGNOSIS — M79641 Pain in right hand: Secondary | ICD-10-CM | POA: Diagnosis not present

## 2017-03-17 DIAGNOSIS — R0781 Pleurodynia: Secondary | ICD-10-CM | POA: Diagnosis not present

## 2017-03-17 DIAGNOSIS — S6991XA Unspecified injury of right wrist, hand and finger(s), initial encounter: Secondary | ICD-10-CM | POA: Diagnosis not present

## 2017-03-17 DIAGNOSIS — S80211A Abrasion, right knee, initial encounter: Secondary | ICD-10-CM | POA: Diagnosis not present

## 2017-03-17 DIAGNOSIS — S40011A Contusion of right shoulder, initial encounter: Secondary | ICD-10-CM | POA: Diagnosis not present

## 2017-03-17 DIAGNOSIS — M25511 Pain in right shoulder: Secondary | ICD-10-CM | POA: Diagnosis not present

## 2017-03-17 DIAGNOSIS — M25551 Pain in right hip: Secondary | ICD-10-CM

## 2017-03-17 DIAGNOSIS — S199XXA Unspecified injury of neck, initial encounter: Secondary | ICD-10-CM | POA: Diagnosis not present

## 2017-03-17 DIAGNOSIS — S299XXA Unspecified injury of thorax, initial encounter: Secondary | ICD-10-CM | POA: Diagnosis not present

## 2017-03-17 DIAGNOSIS — W19XXXA Unspecified fall, initial encounter: Secondary | ICD-10-CM

## 2017-03-17 IMAGING — CT CT CERVICAL SPINE W/O CM
3 of 4 series · 13 of 33 positions shown, 16 images · non-contrast
Comparison: Neck CTA [DATE]

CLINICAL DATA: Fall after visit to PCP.

EXAM:
CT CERVICAL SPINE WITHOUT CONTRAST
TECHNIQUE: Multidetector CT imaging of the cervical spine was performed without
intravenous contrast. Multiplanar CT image reconstructions were also
generated.

[Series 6: sag bone · sagittal · 0.30mm/px · 5 of 61 slices shown, 6 images]
[im 21/61  bone]
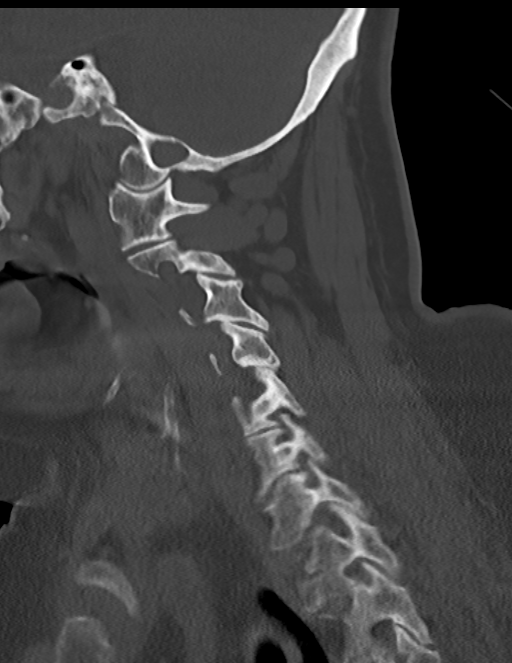
[im 26/61  bone]
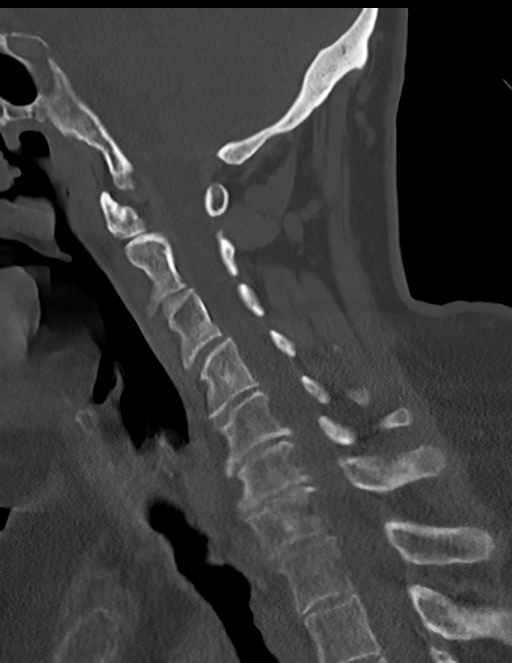
[im 31/61  soft-tissue]
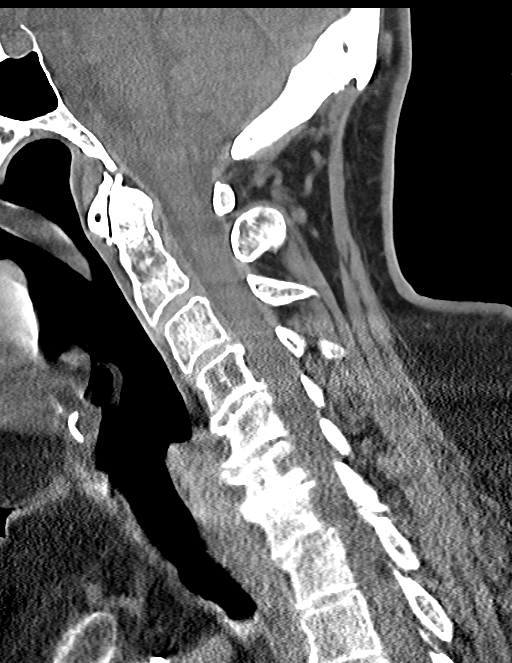
[im 31/61  bone]
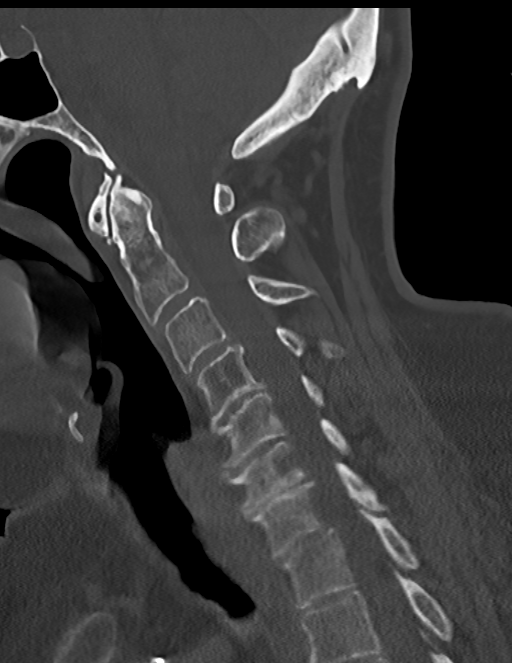
[im 36/61  bone]
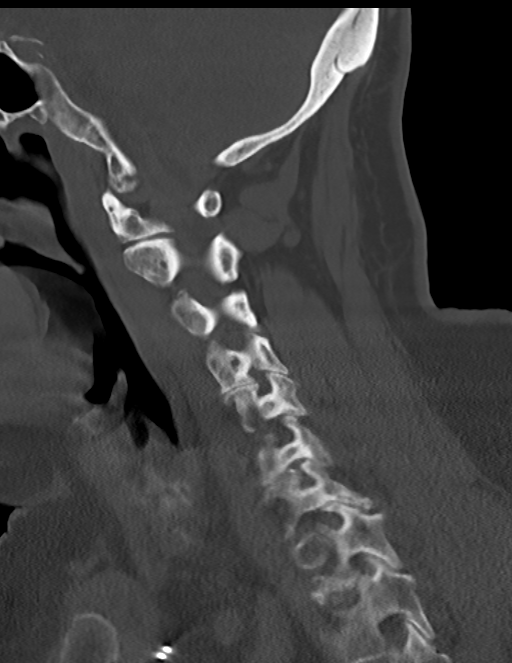
[im 41/61  bone]
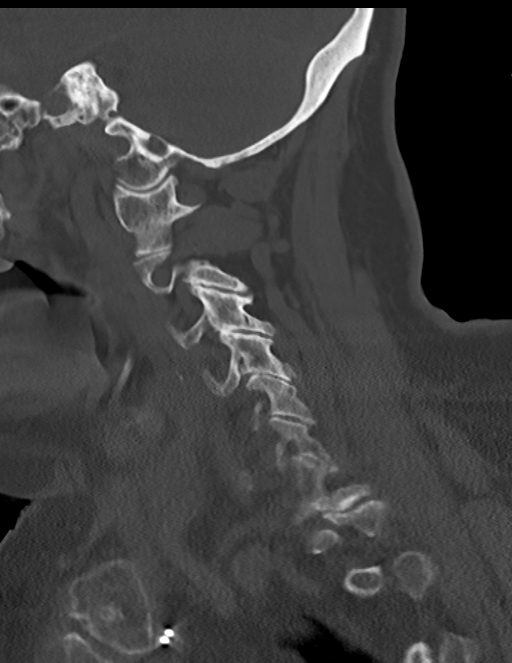

[Series 7: cor bone · coronal · 0.29mm/px · 3 of 71 slices shown]
[im 18/71  bone]
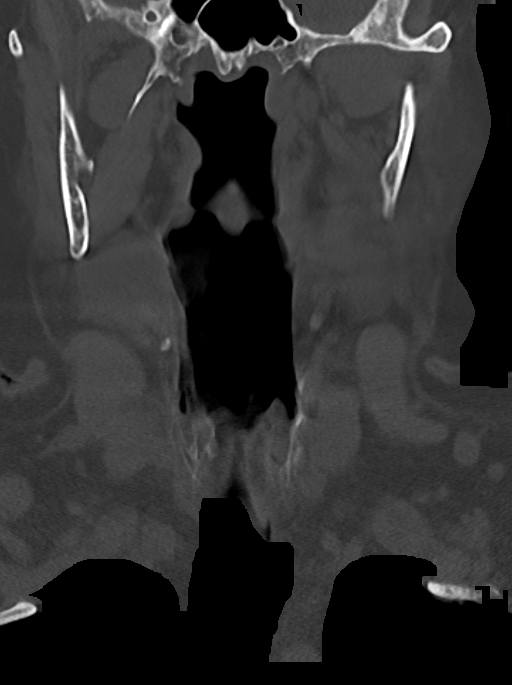
[im 30/71  bone]
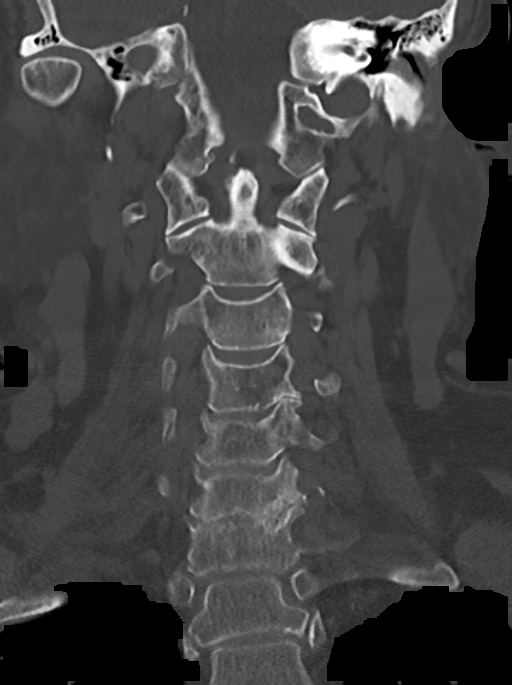
[im 42/71  bone]
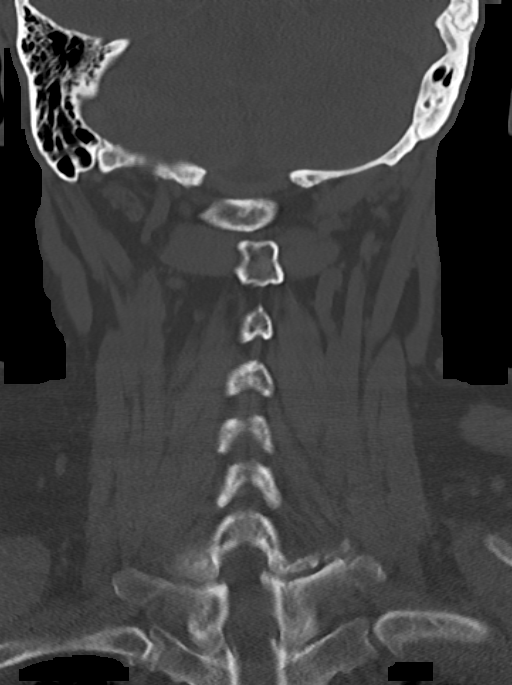

[Series 8: orthogonal axials · axial · 0.21mm/px · z∈[+1172,+1270]mm · 5 of 80 slices shown, 7 images]
[im 14/80  soft-tissue]
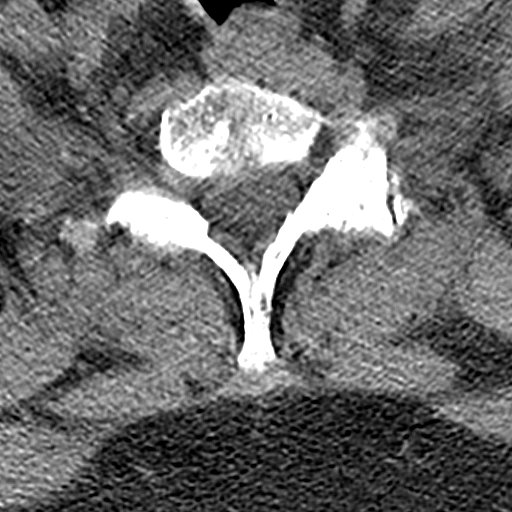
[im 14/80  bone]
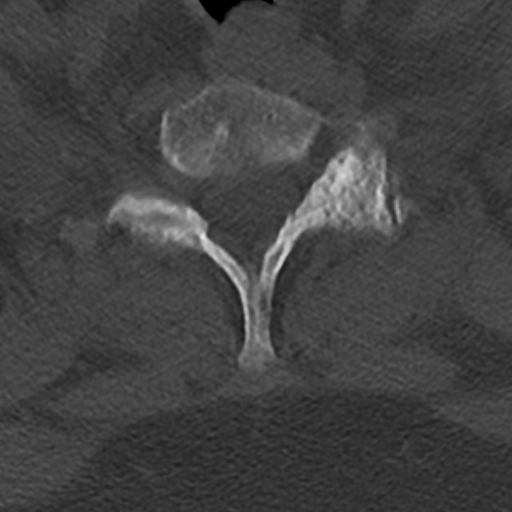
[im 27/80  bone]
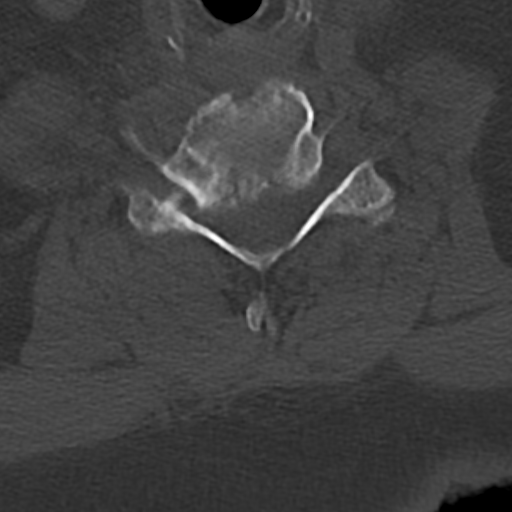
[im 40/80  bone]
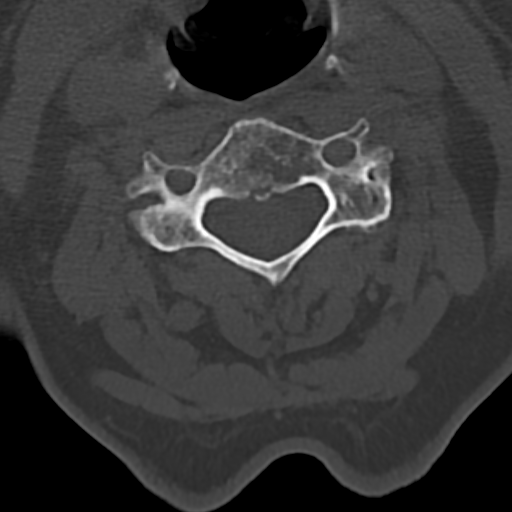
[im 53/80  bone]
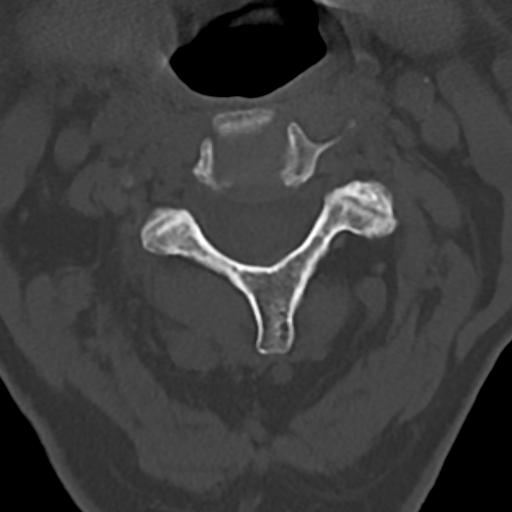
[im 66/80  soft-tissue]
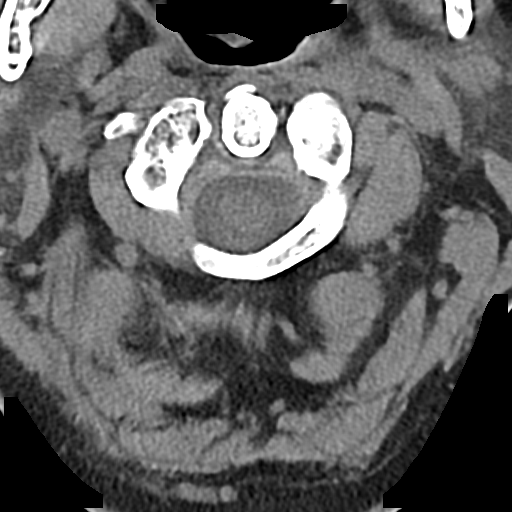
[im 66/80  bone]
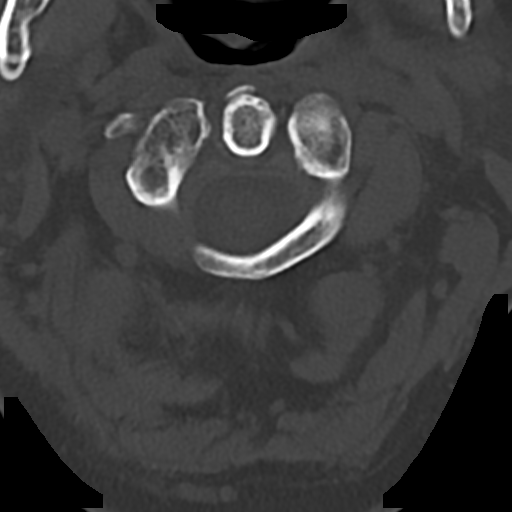

[13 of 33 positions shown; findings below may reference images not displayed]

FINDINGS: Alignment: Slight C3-4 and C7-T1 anterolisthesis, chronic and
degenerative.

Skull base and vertebrae: Negative for fracture. A hemangioma is
seen in the C4 body. Lucency in the posterior C6 vertebral body that
is stable from [11] neck CTA.

Soft tissues and spinal canal: No prevertebral fluid or swelling. No
visible canal hematoma.

Disc levels: Generalized disc narrowing. Uncovertebral spurs and
disc height loss causes foraminal narrowing most notable at C5-6 and
C6-7. Cervical facet arthropathy most notable at C7-T1.

Upper chest: Negative
IMPRESSION: No evidence of cervical spine injury.

## 2017-03-17 IMAGING — DX DG ELBOW COMPLETE 3+V*R*
4 series · 4 of 4 positions shown · non-contrast
Comparison: None.

CLINICAL DATA: Fall with pain

EXAM:
RIGHT ELBOW - COMPLETE 3+ VIEW

[elbow ap]
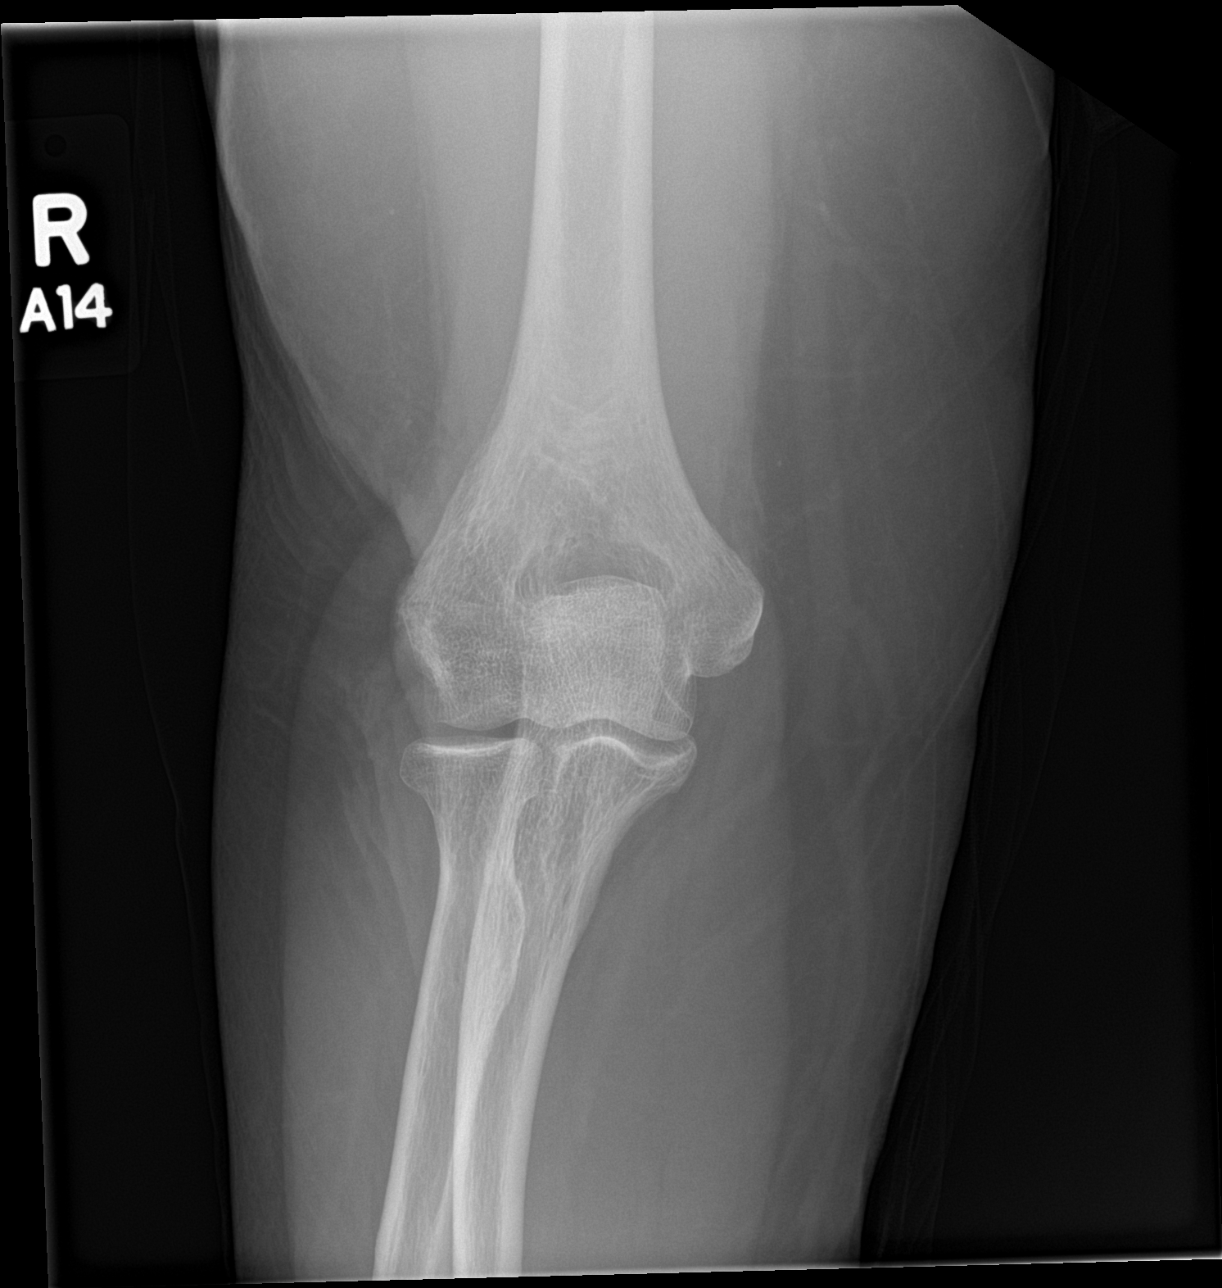

[elbow obl (1 of 2)]
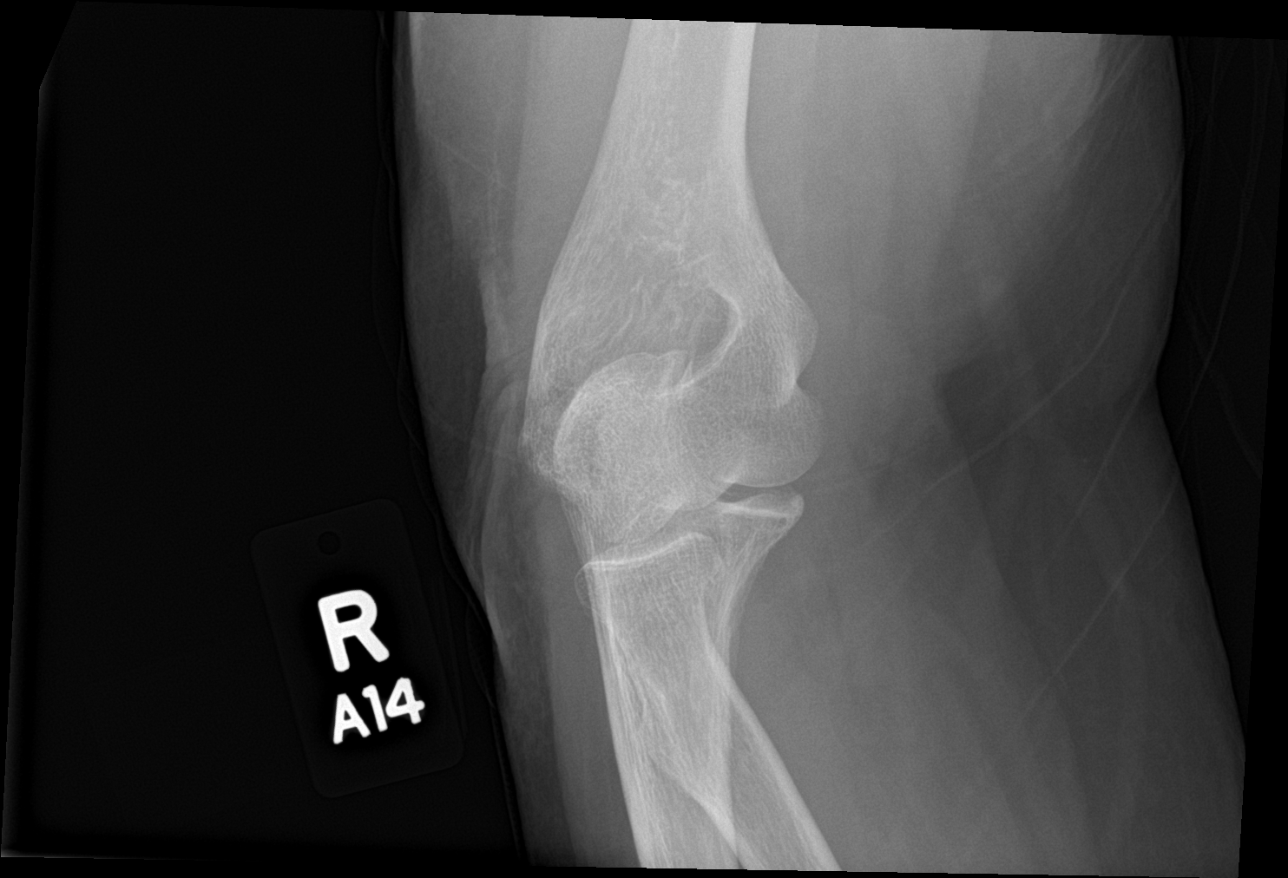

[elbow obl (2 of 2)]
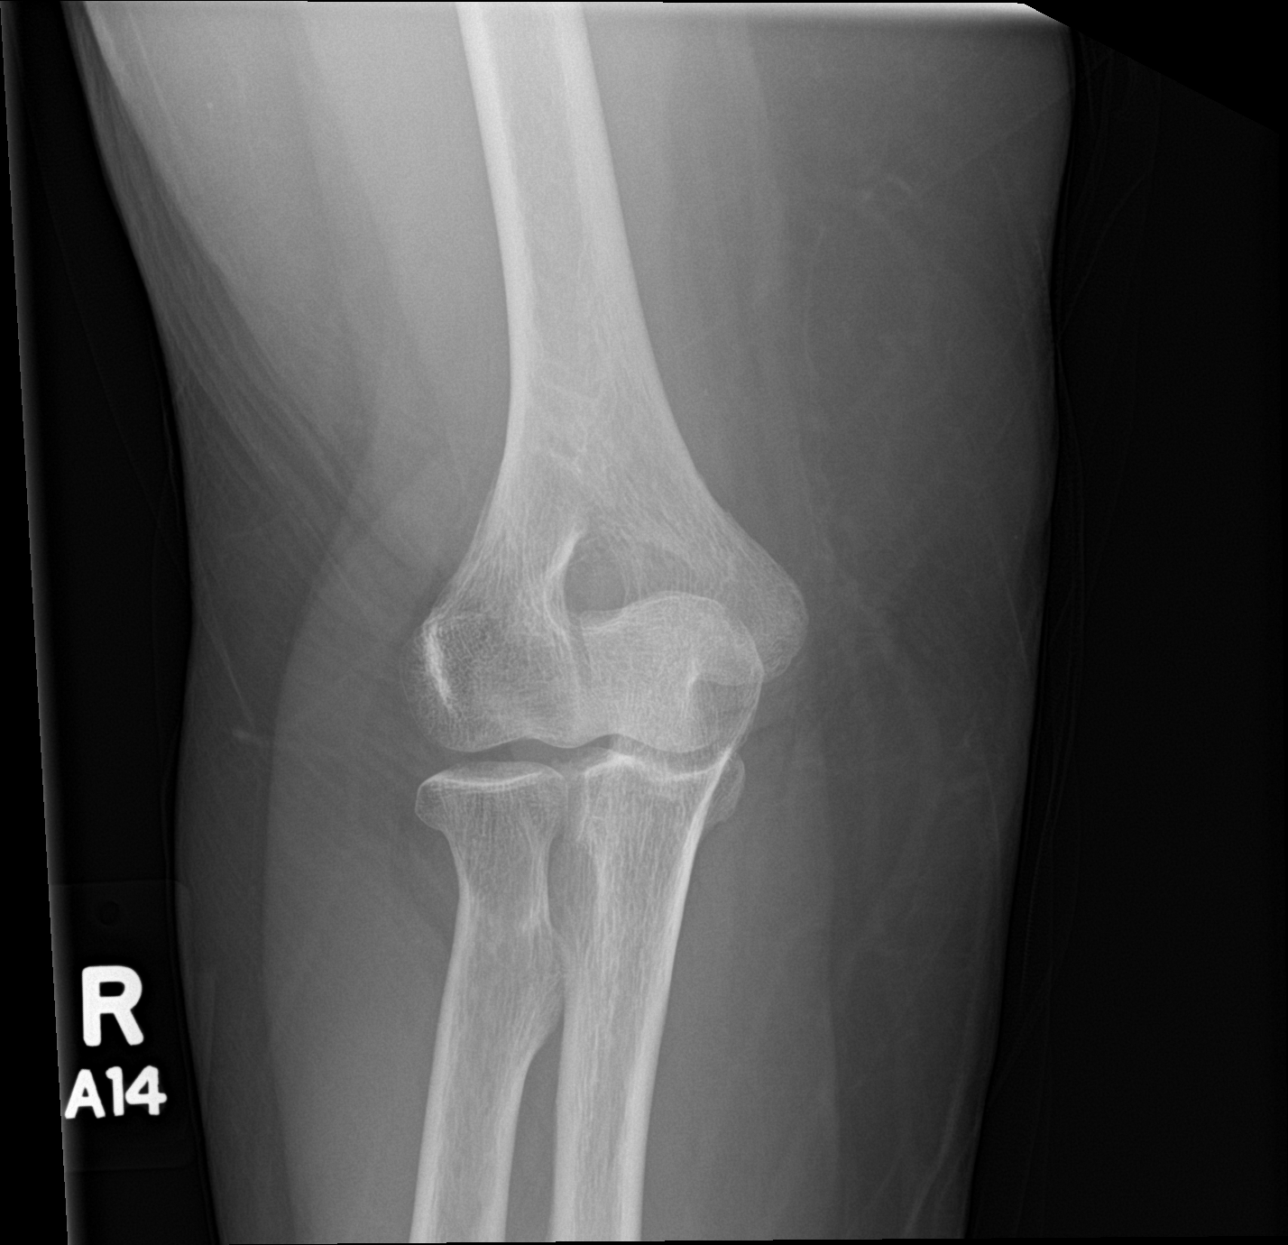

[elbow lat]
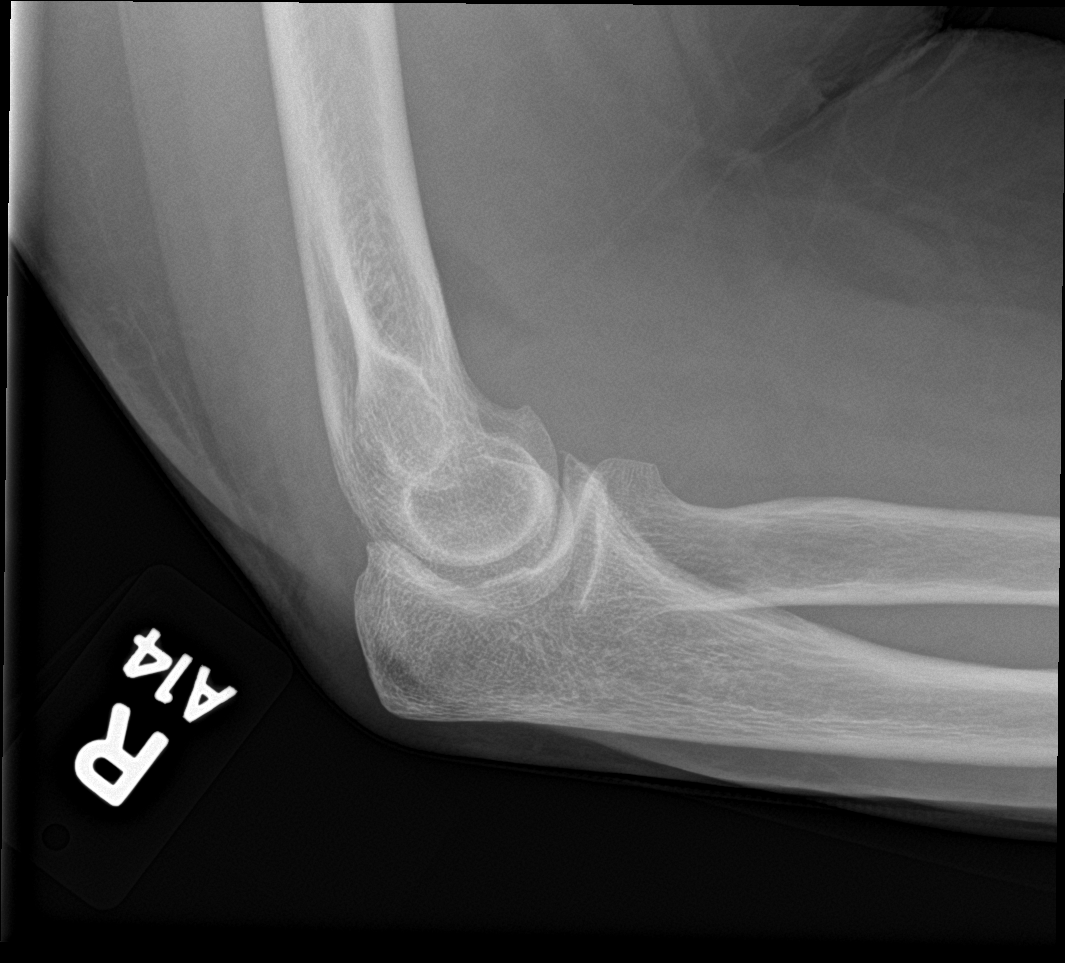

[4 of 4 positions shown; findings below may reference images not displayed]

FINDINGS: Large elbow effusion. No definitive displaced fracture is seen. No
dislocation.
IMPRESSION: Large elbow effusion, occult fracture cannot be excluded. Short
interval radiographic follow-up is recommended.

## 2017-03-17 IMAGING — DX DG SHOULDER 2+V*R*
2 series · 2 of 2 positions shown · non-contrast
Comparison: Report [DATE]

CLINICAL DATA: Fall with shoulder pain

EXAM:
RIGHT SHOULDER - 2+ VIEW

[shoulder grashey]
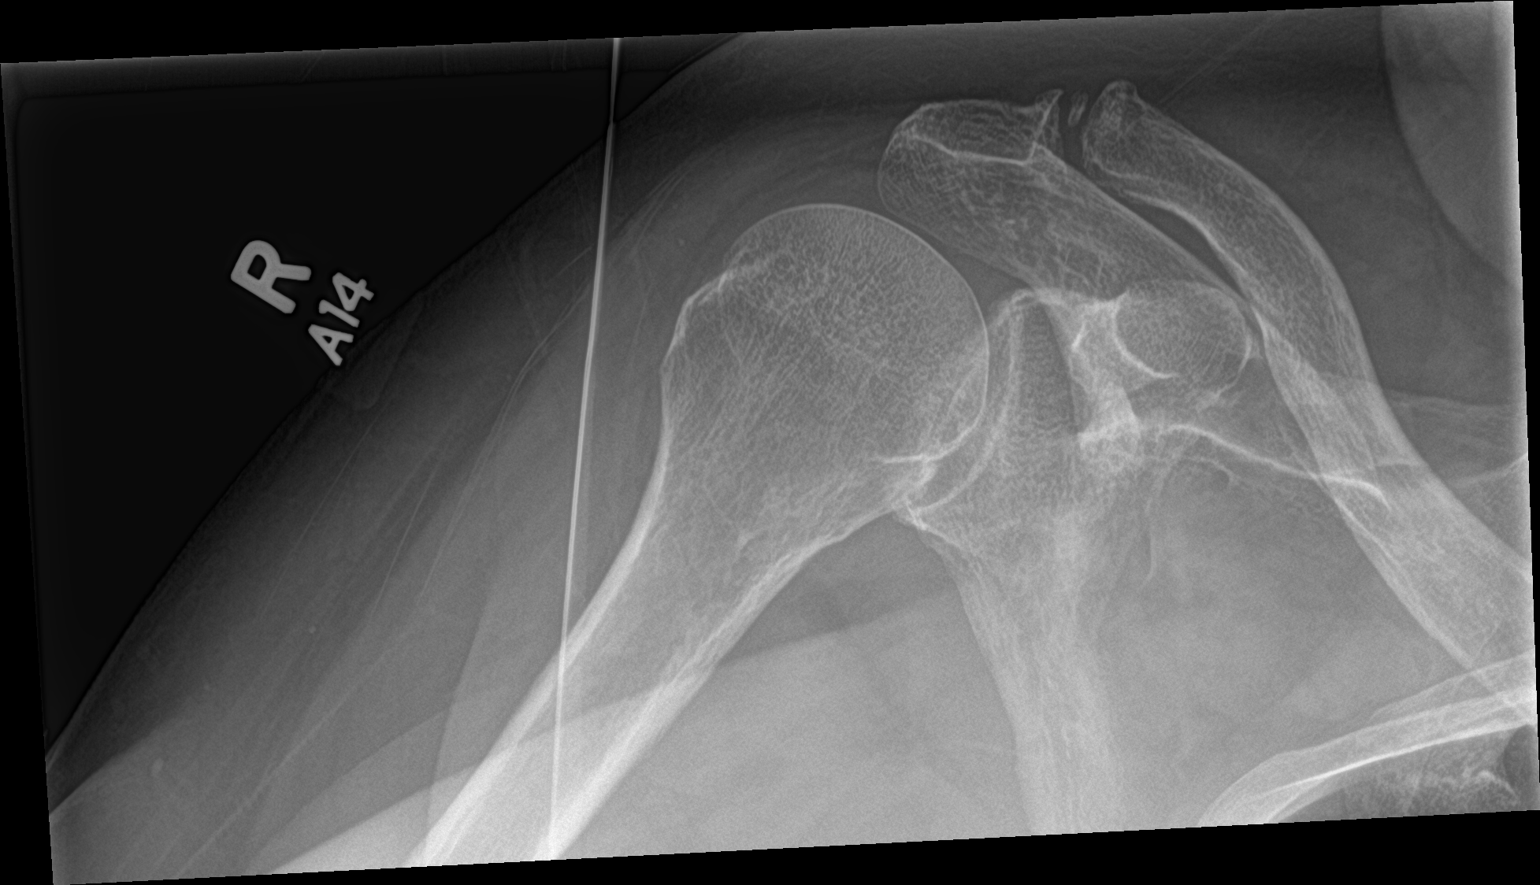

[shoulder y view]
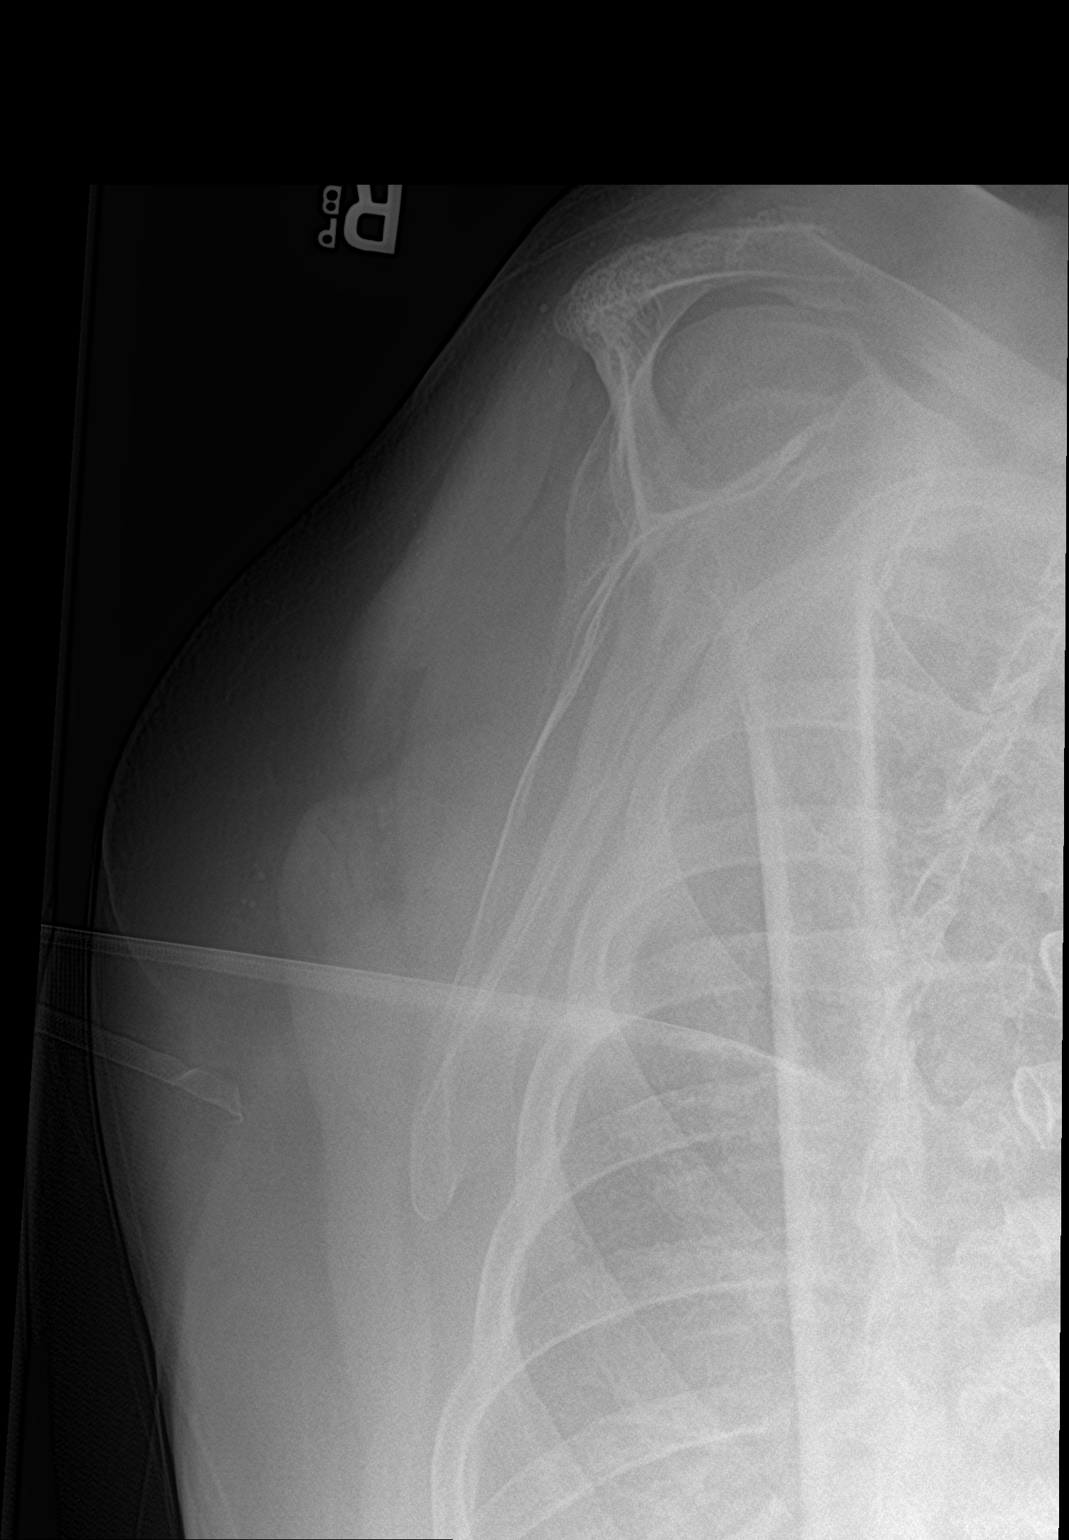

[2 of 2 positions shown; findings below may reference images not displayed]

FINDINGS: AC joint degenerative changes.  No fracture or dislocation.
IMPRESSION: No acute osseous abnormality

## 2017-03-17 IMAGING — DX DG HAND COMPLETE 3+V*R*
3 series · 3 of 3 positions shown · non-contrast
Comparison: None.

CLINICAL DATA: Fall with right hand pain

EXAM:
RIGHT HAND - COMPLETE 3+ VIEW

[hand pa]
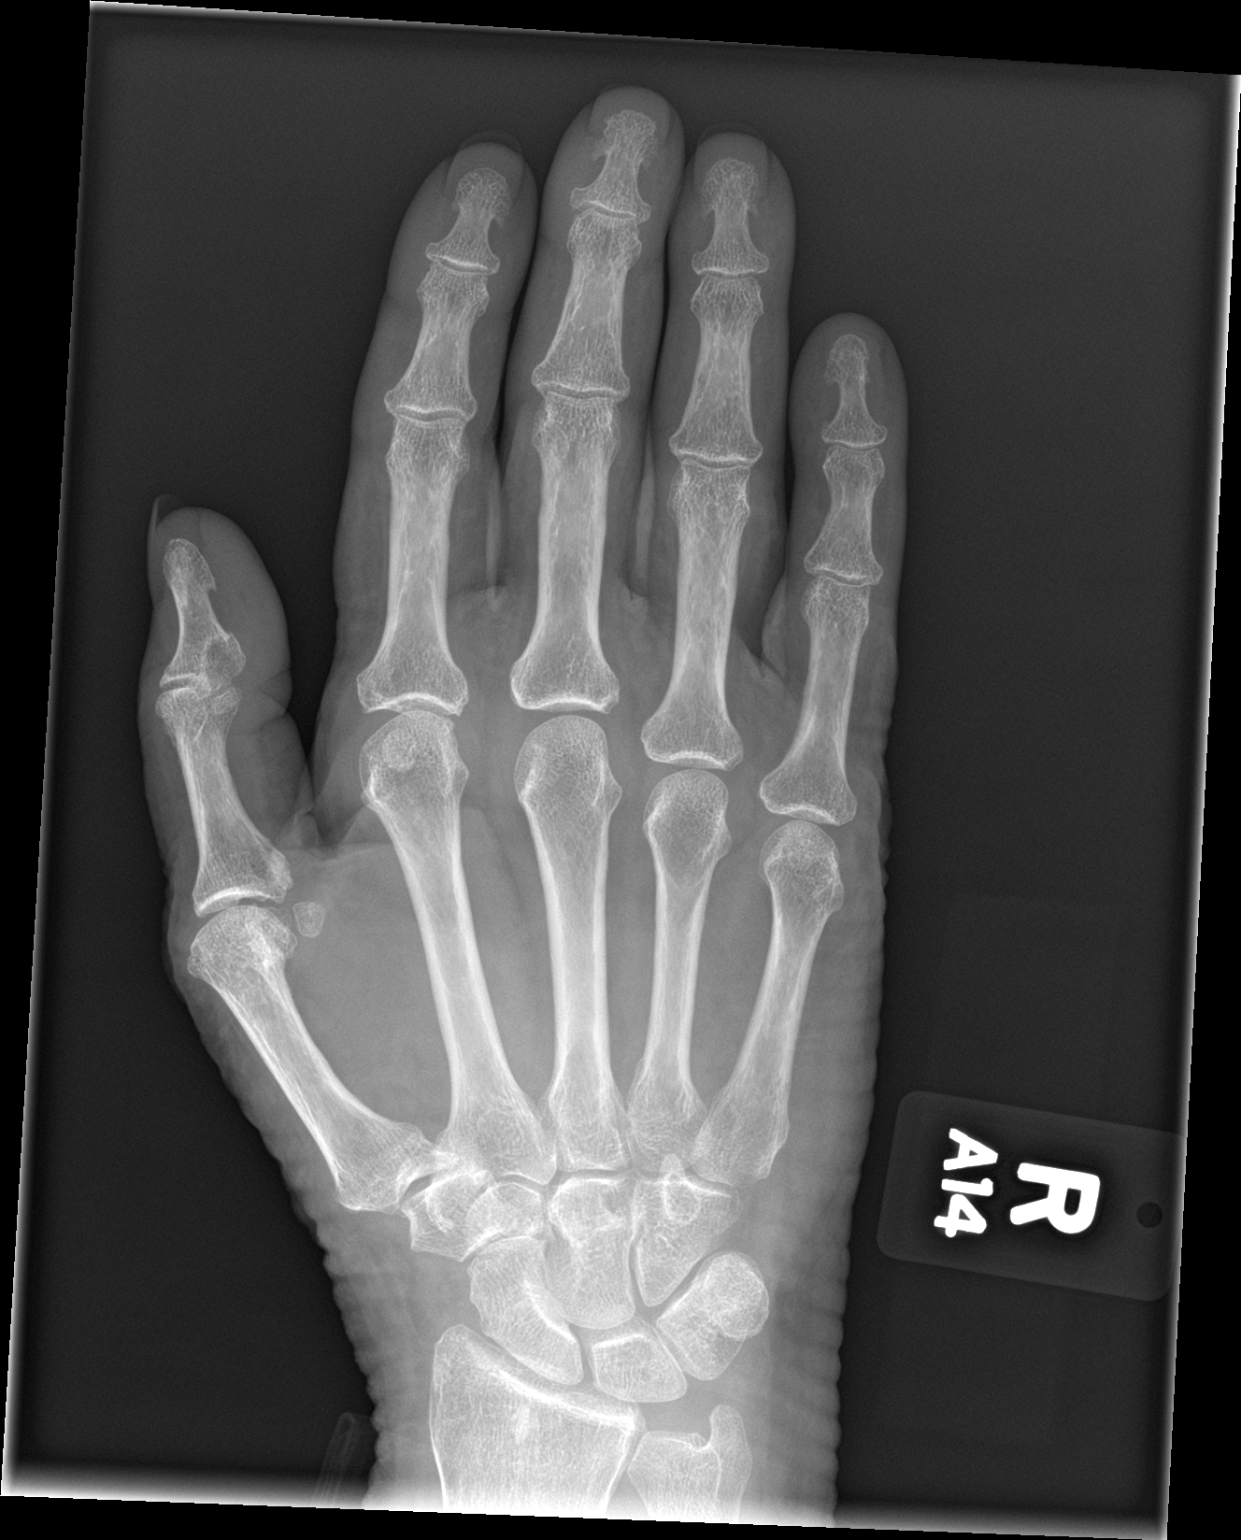

[hand obl]
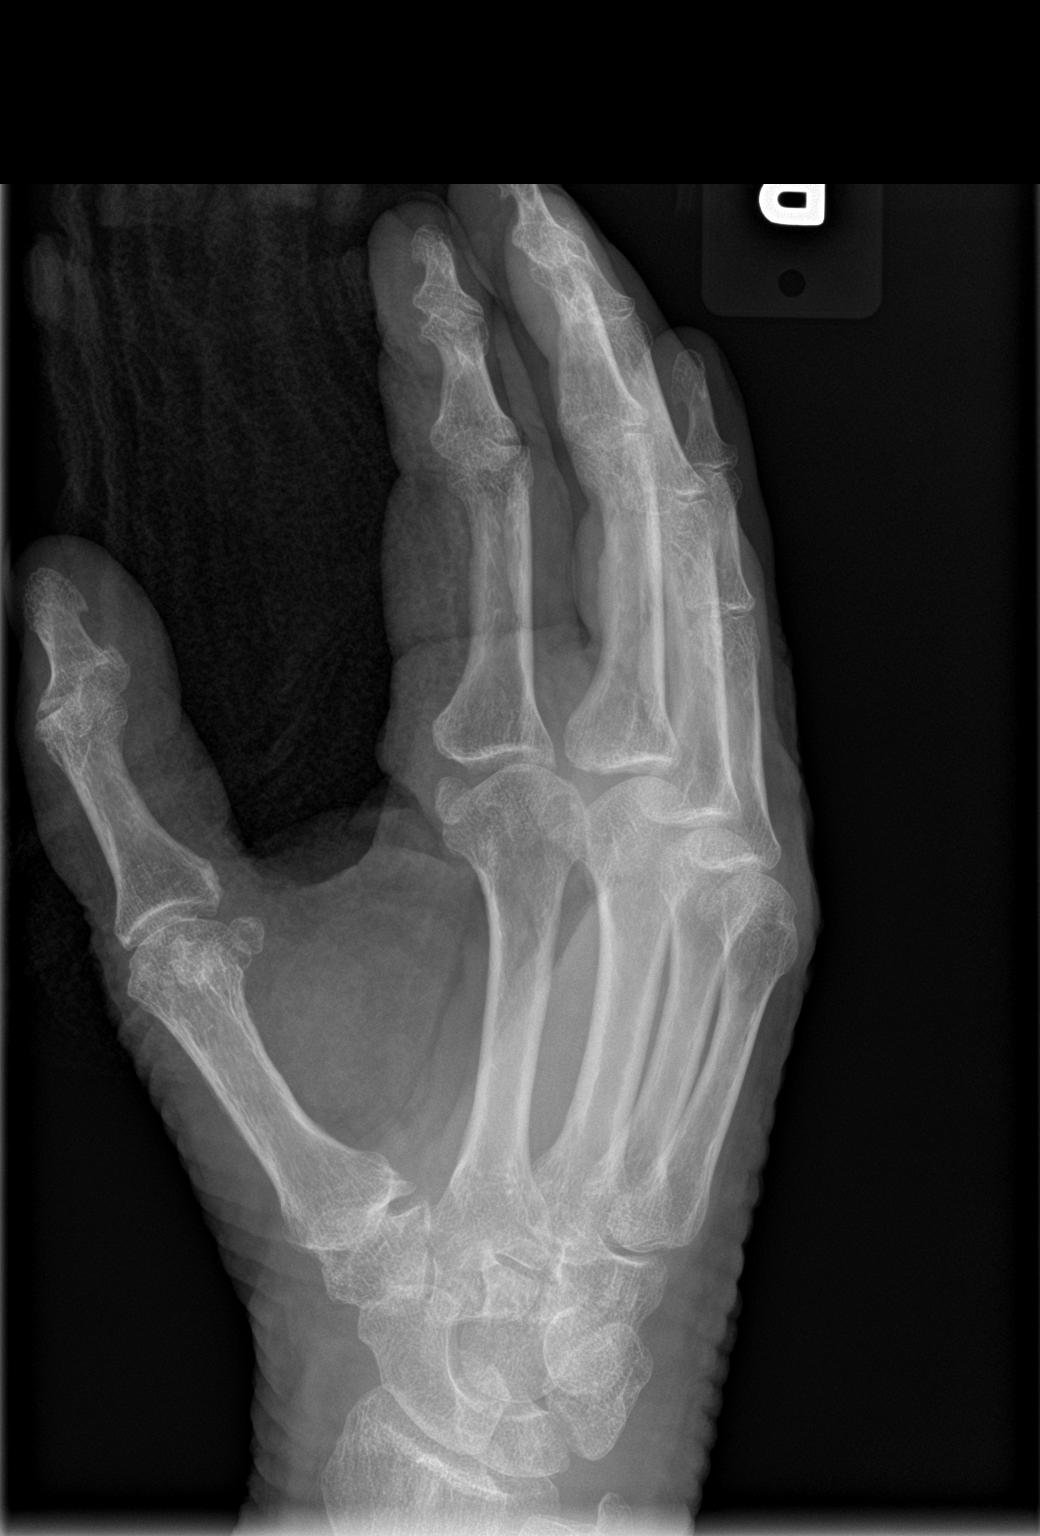

[hand lat]
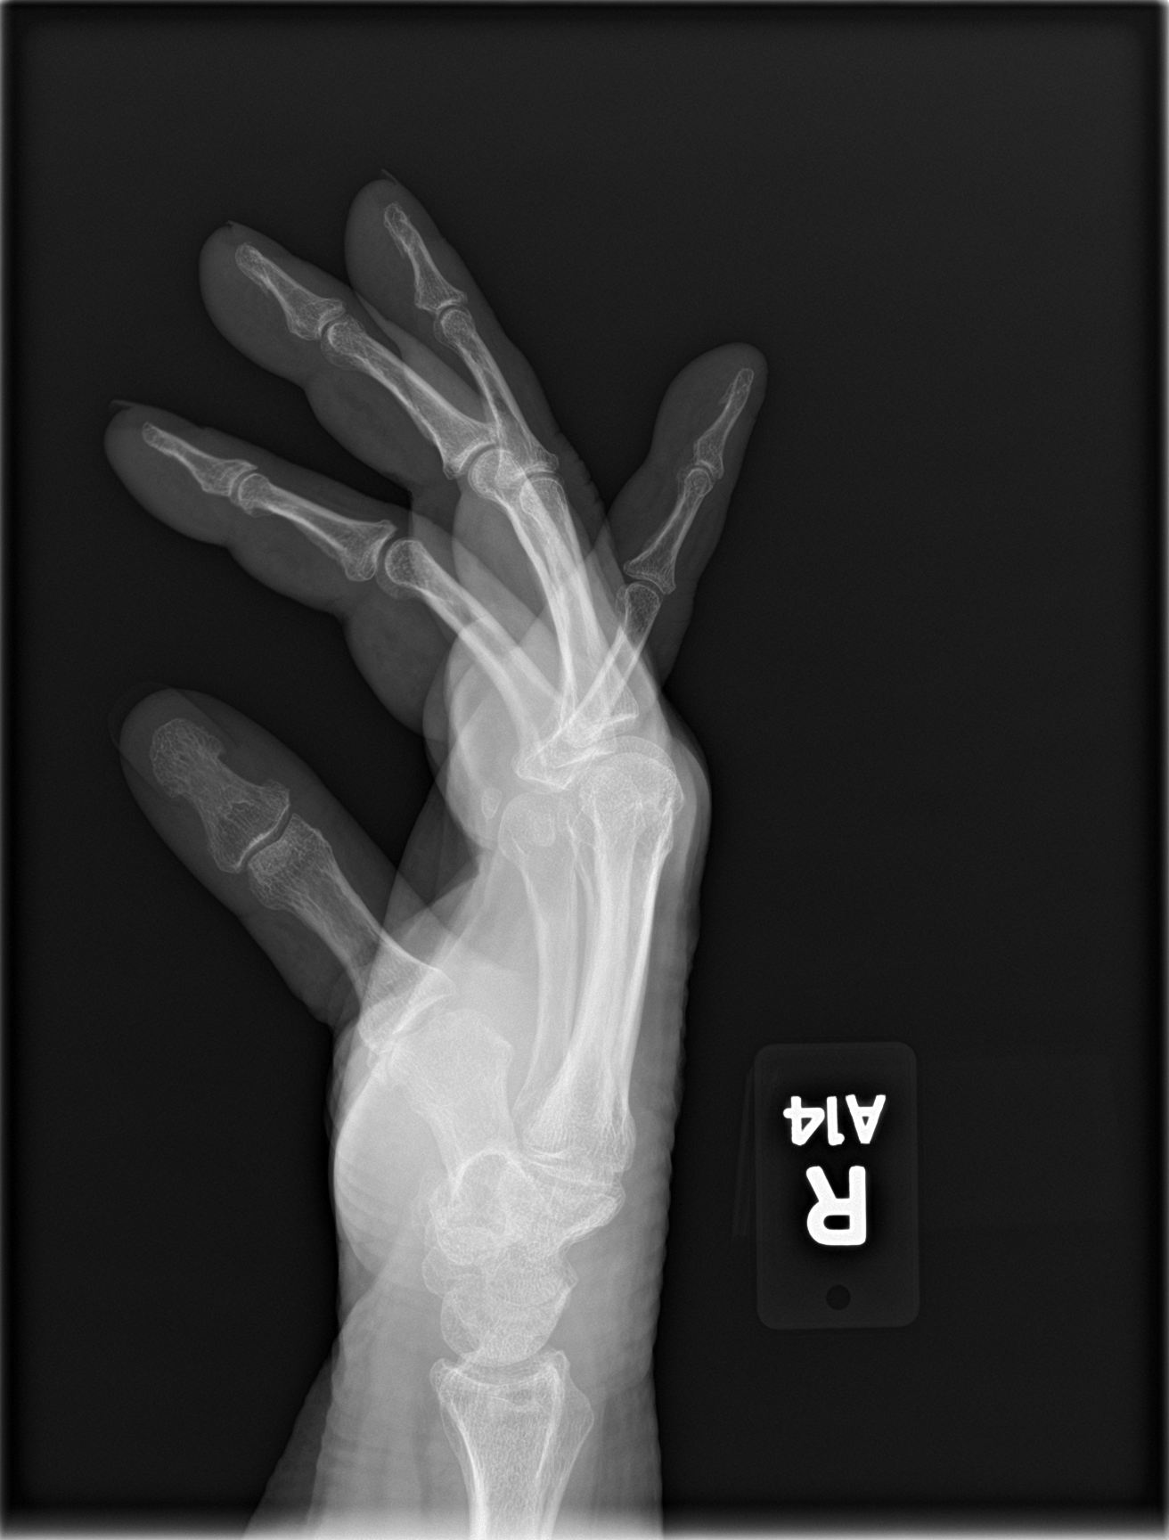

[3 of 3 positions shown; findings below may reference images not displayed]

FINDINGS: No acute displaced fracture or malalignment is seen. Mild
degenerative changes at the first MCP and CMC joints.
IMPRESSION: No acute osseous abnormality

## 2017-03-17 IMAGING — DX DG RIBS W/ CHEST 3+V*R*
5 series · 5 of 5 positions shown · non-contrast
Comparison: [DATE]

CLINICAL DATA: Fall with rib pain

EXAM:
RIGHT RIBS AND CHEST - 3+ VIEW

[rib ap obl (1 of 2)]
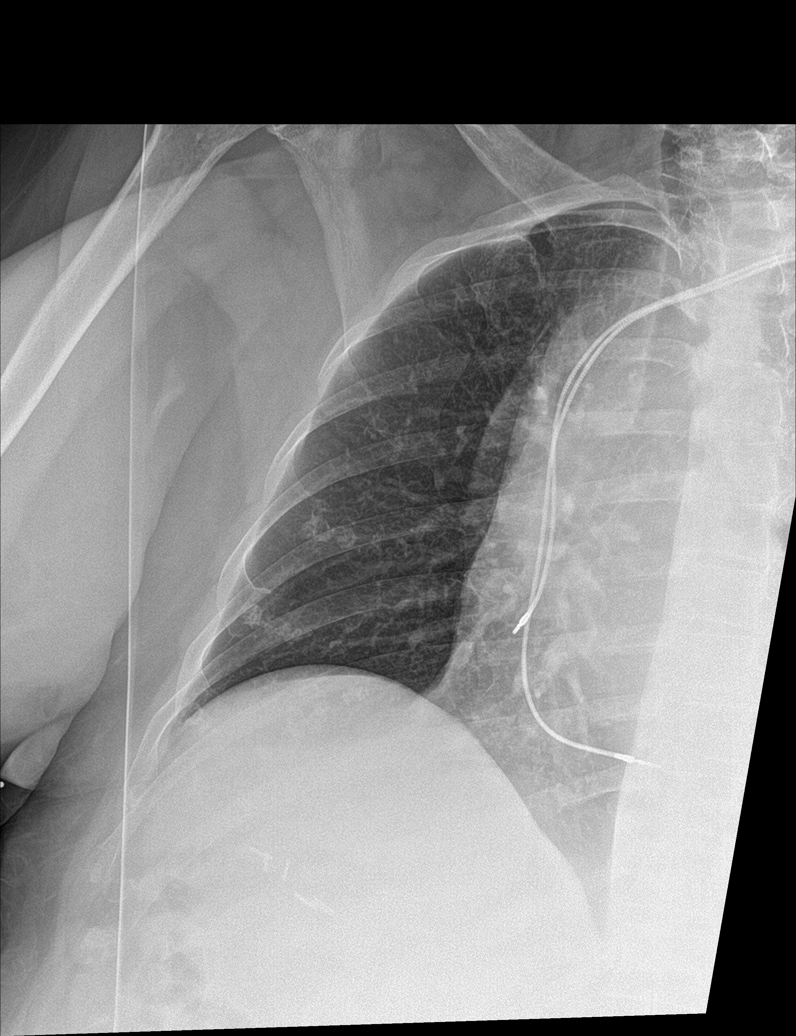

[rib ap obl (2 of 2)]
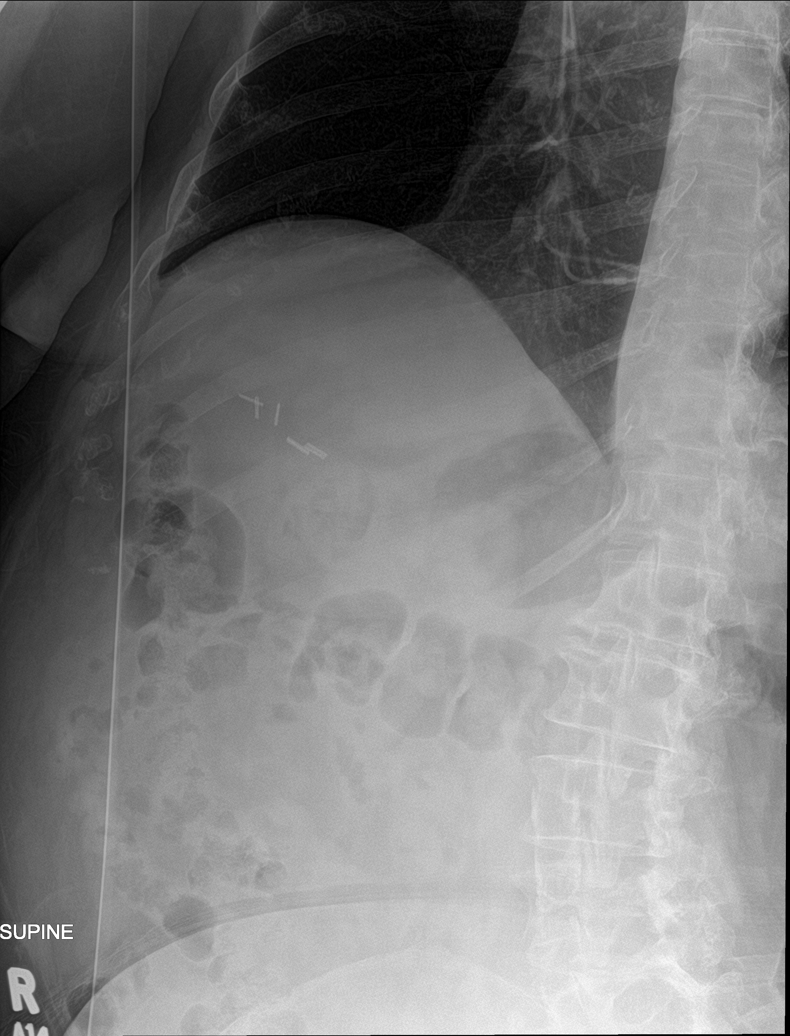

[rib ap (1 of 2)]
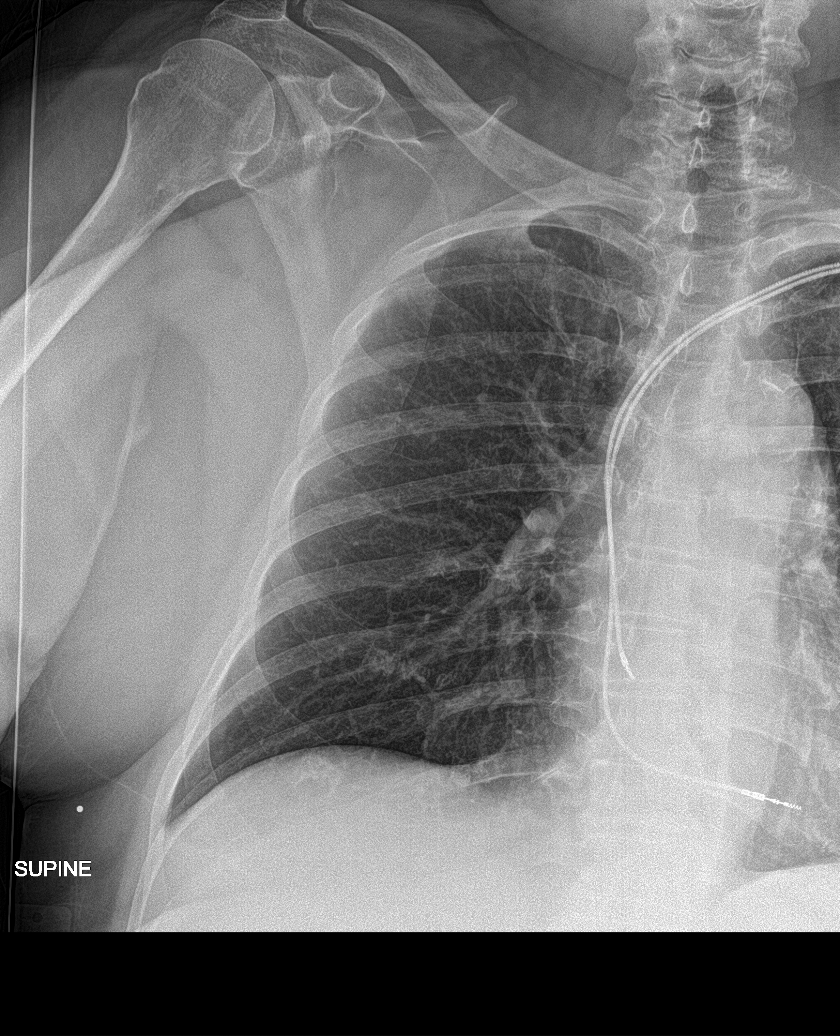

[rib ap (2 of 2)]
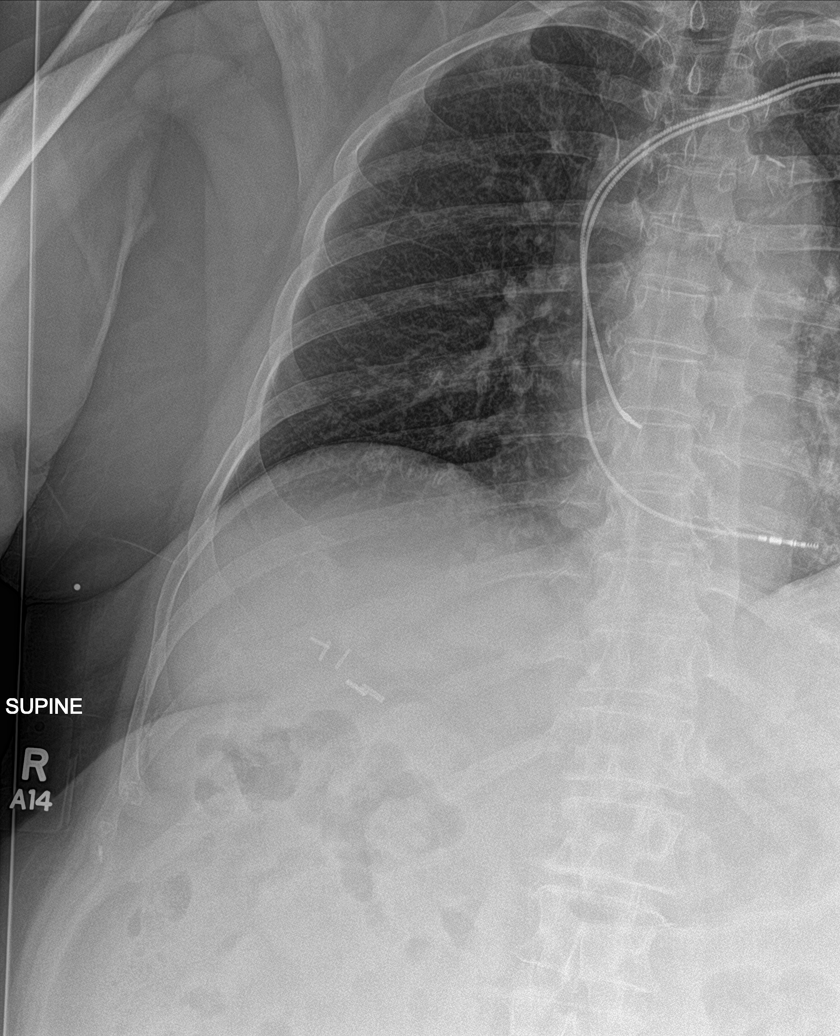

[chest ap]
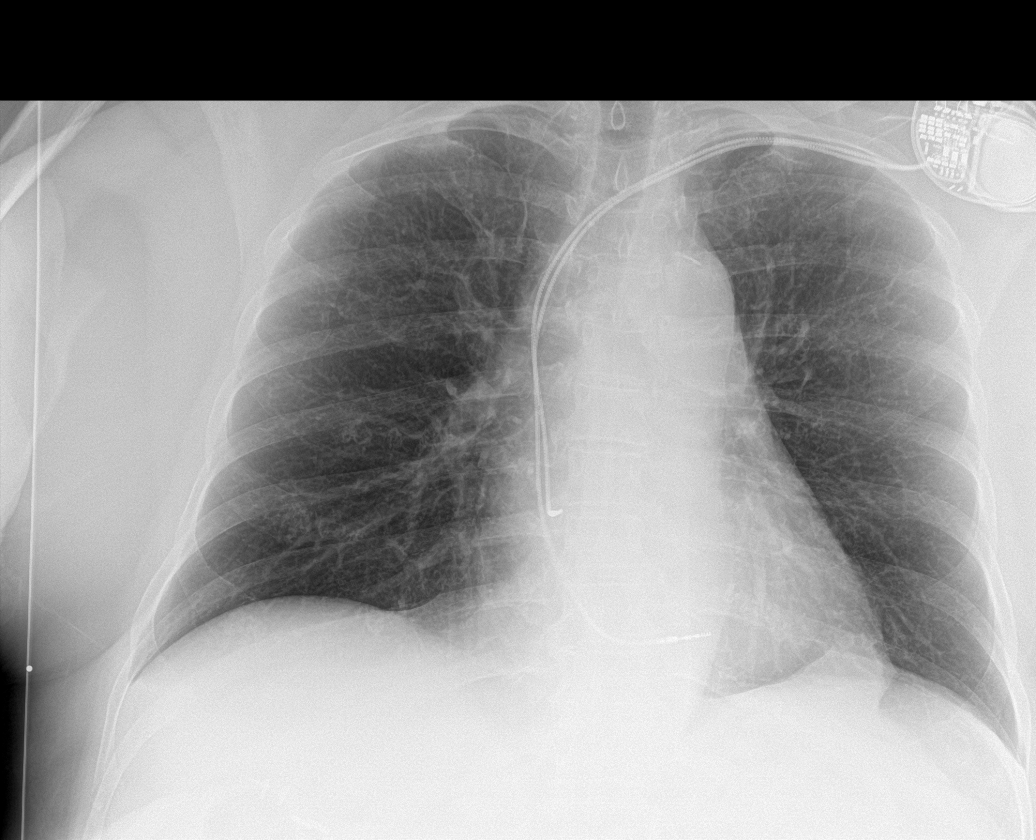

[5 of 5 positions shown; findings below may reference images not displayed]

FINDINGS: Single-view chest demonstrates left-sided pacing device. No focal
pulmonary opacity is seen. Mild scarring at the left base. No
pleural effusion. No pneumothorax. Cardiomediastinal silhouette
within normal limits. Aortic atherosclerosis. Surgical clips in the
right upper quadrant.

Right rib series demonstrates no definite acute displaced right rib
fracture
IMPRESSION: 1. Negative for pneumothorax or pleural effusion
2. No acute displaced right rib fracture

## 2017-03-17 IMAGING — DX DG WRIST COMPLETE 3+V*R*
4 series · 4 of 4 positions shown · non-contrast
Comparison: None.

CLINICAL DATA: Fall with wrist pain

EXAM:
RIGHT WRIST - COMPLETE 3+ VIEW

[wrist pa]
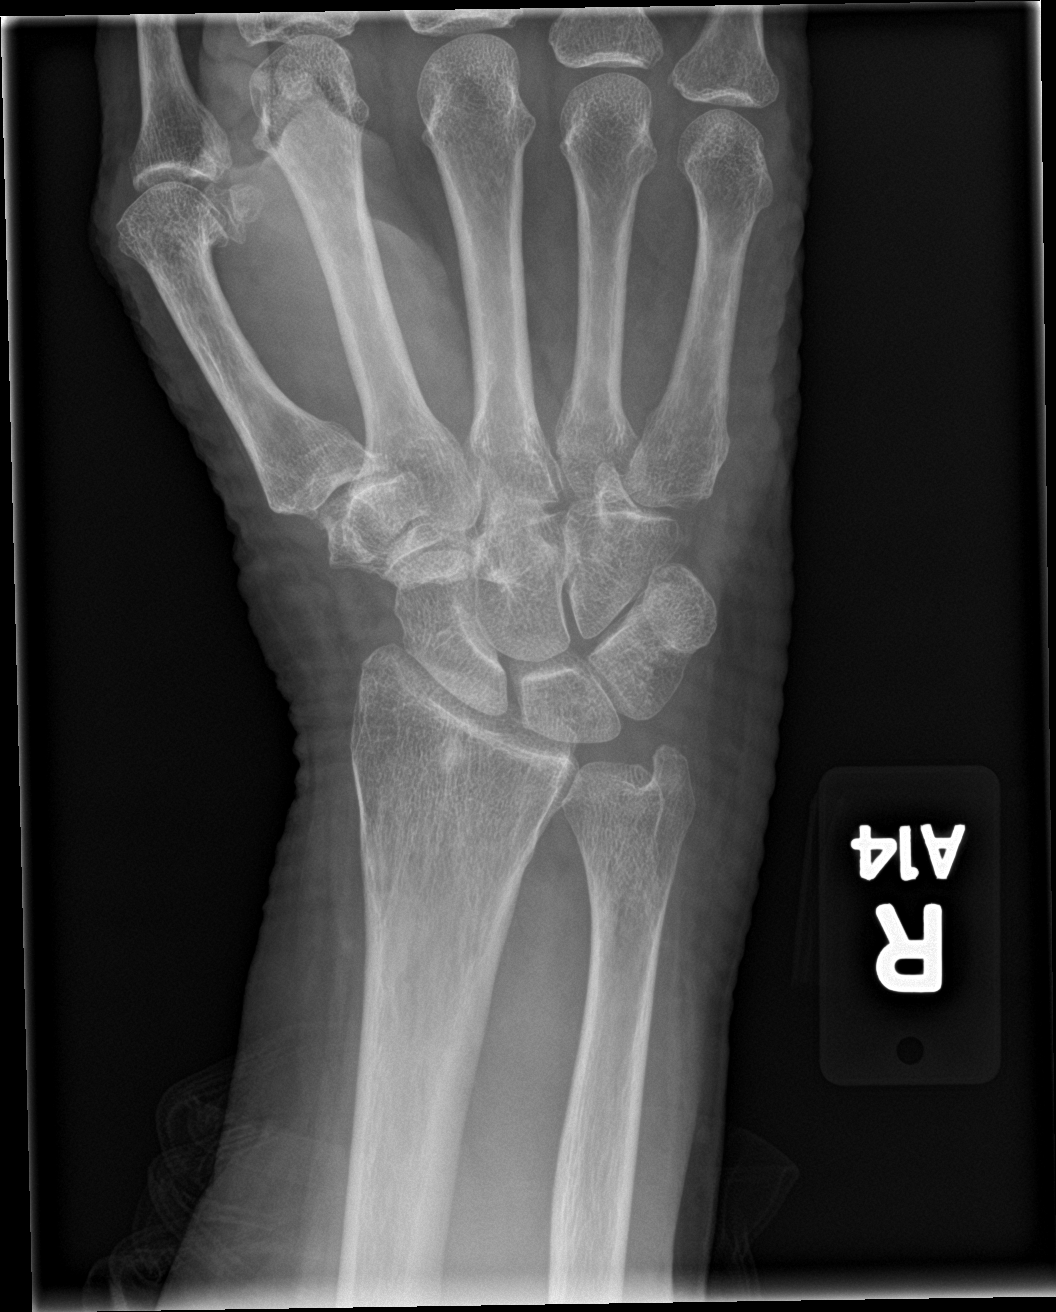

[wrist obl]
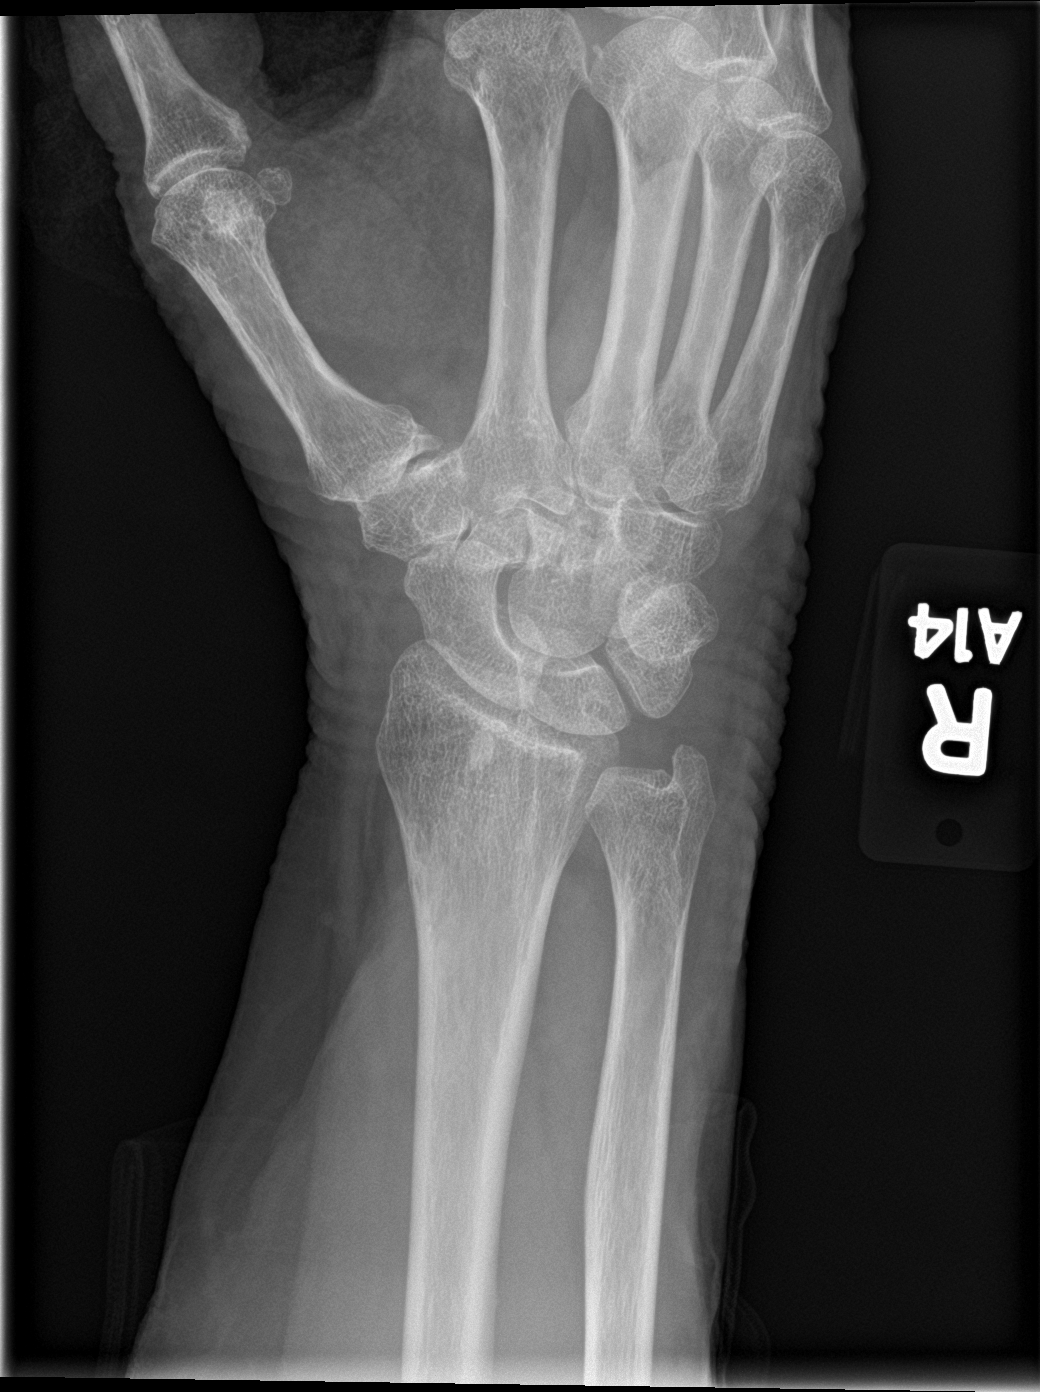

[wrist lat]
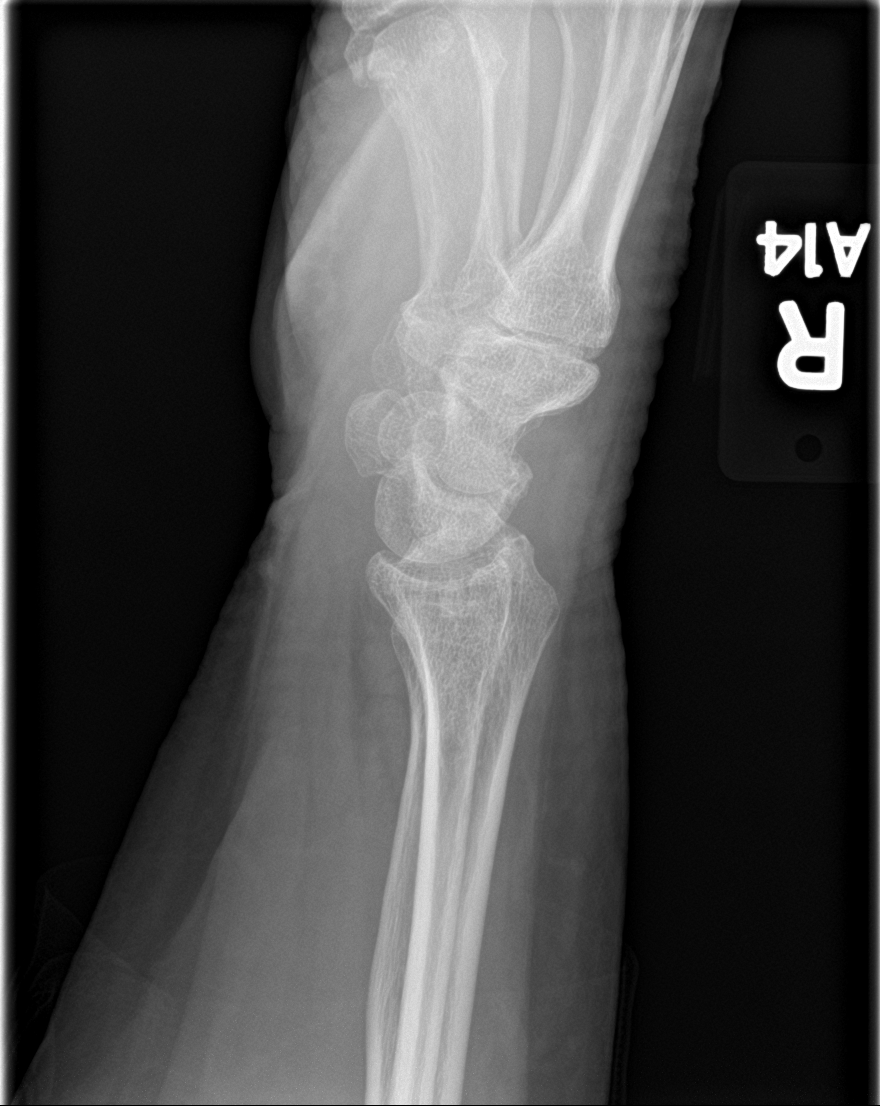

[wrist navicular]
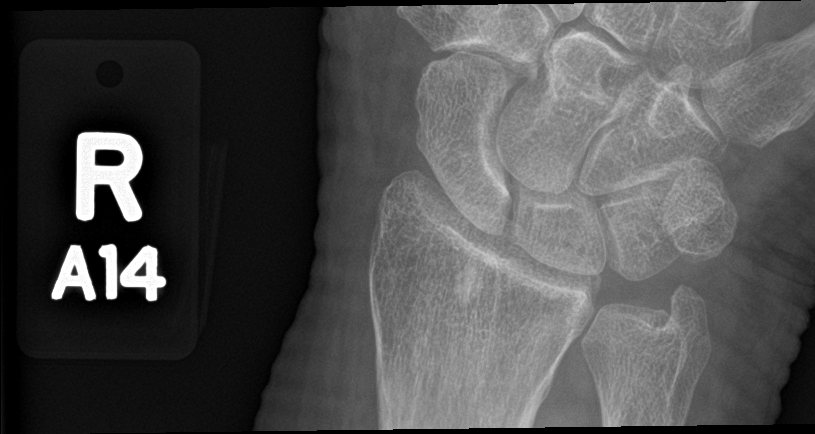

[4 of 4 positions shown; findings below may reference images not displayed]

FINDINGS: Probable bone island in the distal radius. Degenerative changes at
the first CMC joint. No fracture or malalignment.
IMPRESSION: No acute osseous abnormality

## 2017-03-17 MED ORDER — TRAMADOL HCL 50 MG PO TABS: 50.0000 mg | ORAL_TABLET | Freq: Three times a day (TID) | ORAL | 0 refills | Status: DC | PRN

## 2017-03-17 MED ORDER — TRAMADOL HCL 50 MG PO TABS
50.0000 mg | ORAL_TABLET | Freq: Once | ORAL | Status: AC
  Administered 2017-03-17: 50 mg via ORAL
  Filled 2017-03-17: qty 1

## 2017-03-17 NOTE — Discharge Instructions (Signed)
Please continue to wear the splint.  Use a sling for comfort.  Watch for worsening signs of swelling to your capillary refill.  Continue tramadol and naproxen at home.  Would recommend that she follow-up with orthopedics your primary care doctor tomorrow. Please rest, ice, compress and elevated the affected body part to help with swelling and pain.  Return to the ED with any worsening swelling or pain.

## 2017-03-17 NOTE — ED Triage Notes (Signed)
Pt endorses having a mechanical fall Tuesday and landing on her right side. Pt endorses right hand, wrist, elbow, shoulder, and rib cage pain since. VSS. Did not hit head, no loc, not on thinners.

## 2017-03-17 NOTE — ED Notes (Signed)
See provider assessment 

## 2017-03-17 NOTE — ED Notes (Signed)
Paged ortho 

## 2017-03-17 NOTE — Progress Notes (Signed)
Orthopedic Tech Progress Note Patient Details:  Stacey Velazquez [birth year] [id number]  Ortho Devices Type of Ortho Device: Ace wrap, Arm sling, Post (long arm) splint Ortho Device/Splint Location: RUE Ortho Device/Splint Interventions: Ordered, Application   Post Interventions Patient Tolerated: Well Instructions Provided: Care of device   Braulio Bosch 03/17/2017, 9:07 PM

## 2017-03-17 NOTE — ED Provider Notes (Signed)
[facility] DEPARTMENT Provider Note   CSN: [id number] Arrival date & time: 03/17/17  1746     History   Chief Complaint Chief Complaint  Patient presents with  . Fall    HPI Stacey Velazquez is a 72 y.o. female.  HPI 72 year old Caucasian female presents to the ED for evaluation of right arm pain and right rib pain after mechanical fall 2-1/2 days ago.  Patient states that she had mechanical fall 2-1/2 days ago.  She fell onto her right side and hit the concrete.  She denies any head injury or LOC.  Had to be helped up by 2 bystanders.  Patient has been ambulatory since the event.  Patient specifically complains of pain to her right shoulder, right elbow, right wrist and right rib cage.  She denies any associated headache, neck pain, vision changes, lightheadedness, dizziness, chest pain, shortness of breath, abdominal pain, cough, fevers, hip pain, urinary symptoms, back pain.  Patient has been taking tramadol and naproxen at home for symptoms with some relief.  Pain is worse with palpation and range of motion.  Patient reports some abrasions to her right knee and right wrist that she has been cleaning with herself at home.  She states her tetanus shot is up-to-date.  Patient reports being right-hand dominant.  She denies any associated paresthesias or weakness.  Patient walks with a cane at baseline due to left hand tremors and arthritis. Past Medical History:  Diagnosis Date  . Anxiety   . Autoimmune hepatitis ([place])   . Bipolar 1 disorder ([place])   . GERD (gastroesophageal reflux disease)   . Glaucoma 2003   . Heart disease   . Hepatitis, autoimmune ([place]) 08/25/2014  . Hypotension   . Hypothyroidism   . Pacemaker Medtronic    MRI compatible  . Seizures ([place])    last sz 08/21/14  . Sinus arrest   . Vertigo    bvvp    Patient Active Problem List   Diagnosis Date Noted  . Vertigo   . Hypotension   . Heart disease   . GERD (gastroesophageal reflux  disease)   . Autoimmune hepatitis ([place])   . Anxiety   . Low back pain 10/18/2016  . Gingival abscess 08/26/2016  . Acute right hip pain 08/26/2016  . Seizure disorder ([place]) 10/14/2014  . Drug-induced Parkinson's disease ([place]) 10/14/2014  . Hypothyroidism 09/09/2014  . Azathioprine-induced leukopenia ([place]) 08/25/2014  . Thrombocytopenia due to drugs 08/25/2014  . Hepatitis, autoimmune ([place]) 08/25/2014  . Syncope   . Seizures ([place])   . Sinus arrest   . Pacemaker Medtronic   . Bipolar 1 disorder ([place])   . Glaucoma 01/25/2001    Past Surgical History:  Procedure Laterality Date  . CATARACT EXTRACTION Left 2008  . KNEE SURGERY Left 1985  . PACEMAKER INSERTION  2014  . PARTIAL HYSTERECTOMY  1979    OB History    No data available       Home Medications    Prior to Admission medications   Medication Sig Start Date End Date Taking? Authorizing Provider  azaTHIOprine (IMURAN) 50 MG tablet Take 50 mg by mouth 3 (three) times daily.    [provider]  benztropine (COGENTIN) 1 MG tablet Take 1 mg by mouth 2 (two) times daily.  06/02/15   [provider]  Cholecalciferol (VITAMIN D) 1000 UNITS capsule Take 1,000 Units by mouth daily.    [provider]  clindamycin (CLEOCIN) 150 MG capsule  Take 1 capsule (150 mg total) by mouth 3 (three) times daily. 08/26/16   Funches, Adriana Mccallum, MD  furosemide (LASIX) 20 MG tablet Take two tablets(40 mg) by mouth for three days then resume taking one tablet (20 mg ) by mouth daily. 12/28/16   [name], MD  Lactobacillus (ACIDOPHOLUS PO) Take 1 tablet by mouth daily as needed (lactose intolerance).    [provider]  levETIRAcetam (KEPPRA) 500 MG tablet take 1 tablet by mouth twice a day 01/26/17   Penumalli, Earlean Polka, MD  levothyroxine (SYNTHROID, LEVOTHROID) 100 MCG tablet Take 1 tablet (100 mcg total) by mouth daily before breakfast. 01/12/17   Charlott Rakes, MD  Rolan Lipa 290 MCG CAPS capsule  09/08/16    [provider]  meclizine (ANTIVERT) 12.5 MG tablet Take 1 tablet (12.5 mg total) by mouth 3 (three) times daily as needed for dizziness. 08/02/14   Lorayne Marek, MD  naproxen (NAPROSYN) 500 MG tablet Take 1 tablet (500 mg total) by mouth 2 (two) times daily with a meal. 01/12/17   Charlott Rakes, MD  potassium chloride SA (K-DUR,KLOR-CON) 20 MEQ tablet Take 1 tablet (20 mEq total) by mouth daily. 05/07/15   Funches, Adriana Mccallum, MD  predniSONE (DELTASONE) 5 MG tablet Take 0.5 tablets by mouth daily. 11/17/15   [provider]  traMADol (ULTRAM) 50 MG tablet Take 1 tablet (50 mg total) by mouth every 8 (eight) hours as needed for severe pain. 09/09/16   Pete Pelt, PA-C  VRAYLAR 1.5 MG CAPS Take 1 capsule by mouth at bedtime. 11/18/15   [provider]    Family History Family History  Problem Relation Age of Onset  . Heart attack Father   . Heart disease Paternal Uncle   . Heart disease Paternal Grandmother   . Diabetes Paternal Grandmother   . Heart disease Paternal Grandfather   . Cancer Mother        BREAST  . Breast cancer Mother   . Cancer Maternal Grandmother   . CVA Maternal Grandmother   . Cancer Maternal Grandfather   . CVA Maternal Grandfather   . Diabetes Sister   . Diabetes Brother   . Diabetes Brother   . Stroke Daughter     Social History Social History   Tobacco Use  . Smoking status: Former Smoker    Last attempt to quit: 03/02/2003    Years since quitting: 14.0  . Smokeless tobacco: Never Used  Substance Use Topics  . Alcohol use: No  . Drug use: No     Allergies   Ambien [zolpidem tartrate]; Amitriptyline; Ampicillin; Anaprox [naproxen sodium]; Benadryl [diphenhydramine hcl (sleep)]; Cetirizine & related; Cortizone-10 [hydrocortisone]; Darvon [propoxyphene]; Diazepam; Diflunisal; Duloxetine; Flexeril [cyclobenzaprine]; Lactose intolerance (gi); Lidocaine; Meperidine and related; Metanx [l-methylfolate-algae-b12-b6];  Metoclopramide; Morphine and related; Nuprin [ibuprofen]; Oxycodone; Penicillins; Propoxyphene; Ranitidine; Vicodin [hydrocodone-acetaminophen]; and Tylenol [acetaminophen]   Review of Systems Review of Systems  Constitutional: Negative for chills and fever.  HENT: Negative for congestion.   Eyes: Negative for visual disturbance.  Respiratory: Negative for shortness of breath.   Cardiovascular: Negative for chest pain.  Gastrointestinal: Negative for abdominal pain.  Musculoskeletal: Positive for arthralgias, back pain, gait problem, joint swelling and myalgias. Negative for neck pain and neck stiffness.  Skin: Positive for color change and wound.  Neurological: Negative for dizziness, syncope, weakness, light-headedness, numbness and headaches.     Physical Exam Updated Vital Signs BP 136/90 (BP Location: Left Arm)   Pulse 90   Temp 98.2 F (  36.8 C) (Oral)   Resp 16   Ht 5\' 9"  (1.753 m)   Wt 111.1 kg (245 lb)   SpO2 99%   BMI 36.18 kg/m   Physical Exam  Musculoskeletal:       Right elbow: She exhibits decreased range of motion, swelling and effusion. She exhibits no deformity and no laceration. Tenderness found. Radial head tenderness noted. No medial epicondyle, no lateral epicondyle and no olecranon process tenderness noted.       Right wrist: She exhibits decreased range of motion, tenderness, bony tenderness and swelling. She exhibits no effusion, no crepitus, no deformity and no laceration.  Radial pulses are 2+ bilaterally.  Brisk cap refill.  Normal grip strength.  No scaphoid tenderness.  Sensation intact.  Limited range of motion of the right elbow.  Small abrasion noted.  Full range of motion of the right shoulder but with some bruising and tenderness to palpation noted over the humeral head.  No obvious deformity.  Normal strength with flexion extension of the bicep.   Physical Exam  Constitutional: Pt is oriented to person, place, and time. Appears well-developed and  well-nourished. No distress.  HENT:  Head: Normocephalic and atraumatic.  Ears: No bilateral hemotympanum. Nose: Nose normal. No septal hematoma. Mouth/Throat: Uvula is midline, oropharynx is clear and moist and mucous membranes are normal.  Eyes: Conjunctivae and EOM are normal. Pupils are equal, round, and reactive to light.  Neck: No spinous process tenderness and no muscular tenderness present. No rigidity. Normal range of motion present.  Full ROM without pain No midline cervical tenderness No crepitus, deformity or step-offs  No paraspinal tenderness  Cardiovascular: Normal rate, regular rhythm and intact distal pulses.   Pulses:      Radial pulses are 2+ on the right side, and 2+ on the left side.       Dorsalis pedis pulses are 2+ on the right side, and 2+ on the left side.       Posterior tibial pulses are 2+ on the right side, and 2+ on the left side.  Pulmonary/Chest: Effort normal and breath sounds normal. No accessory muscle usage. No respiratory distress. No decreased breath sounds. No wheezes. No rhonchi. No rales.  Patient does have tenderness to the right lateral rib cage without any ecchymosis or deformity noted.  No step-offs or crepitus noted. No flail segment, crepitus or deformity Equal chest expansion  Abdominal: Soft. Normal appearance and bowel sounds are normal. There is no tenderness. There is no rigidity, no guarding and no CVA tenderness.  Abd soft and nontender .  Small scattered bruises on the abdomen without any tenderness to palpation of the abdomen.  No distention noted. Musculoskeletal: Normal range of motion.       Thoracic back: Exhibits normal range of motion.       Lumbar back: Exhibits normal range of motion.  Full range of motion of the T-spine and L-spine No tenderness to palpation of the spinous processes of the T-spine or L-spine No crepitus, deformity or step-offs No tenderness to palpation of the paraspinous muscles of the L-spine  Pelvis is  stable.  Full range of motion of both hip joints.  Patient has been ambulatory. Lymphadenopathy:    Pt has no cervical adenopathy.  Neurological: Pt is alert and oriented to person, place, and time. Normal reflexes. No cranial nerve deficit. GCS eye subscore is 4. GCS verbal subscore is 5. GCS motor subscore is 6.  Reflex Scores:  Bicep reflexes are 2+ on the right side and 2+ on the left side.      Brachioradialis reflexes are 2+ on the right side and 2+ on the left side.      Patellar reflexes are 2+ on the right side and 2+ on the left side.      Achilles reflexes are 2+ on the right side and 2+ on the left side. Speech is clear and goal oriented, follows commands Normal 5/5 strength in upper and lower extremities bilaterally including dorsiflexion and plantar flexion, strong and equal grip strength Sensation normal to light and sharp touch Moves extremities without ataxia, coordination intact Normal gait and balance No Clonus  Patient does have a tremor left hand at baseline. Skin: Skin is warm and dry. No rash noted. Pt is not diaphoretic.  Patient has abrasion to the right knee and right hand.  This appears to be healing well without any signs of infection. Psychiatric: Normal mood and affect.  Nursing note and vitals reviewed.     ED Treatments / Results  Labs (all labs ordered are listed, but only abnormal results are displayed) Labs Reviewed - No data to display  EKG  EKG Interpretation None       Radiology Dg Ribs Unilateral W/chest Right  Result Date: 03/17/2017 CLINICAL DATA:  Fall with rib pain EXAM: RIGHT RIBS AND CHEST - 3+ VIEW COMPARISON:  08/22/2014 FINDINGS: Single-view chest demonstrates left-sided pacing device. No focal pulmonary opacity is seen. Mild scarring at the left base. No pleural effusion. No pneumothorax. Cardiomediastinal silhouette within normal limits. Aortic atherosclerosis. Surgical clips in the right upper quadrant. Right rib series  demonstrates no definite acute displaced right rib fracture IMPRESSION: 1. Negative for pneumothorax or pleural effusion 2. No acute displaced right rib fracture Electronically Signed   By: Donavan Foil M.D.   On: 03/17/2017 20:08   Dg Shoulder Right  Result Date: 03/17/2017 CLINICAL DATA:  Fall with shoulder pain EXAM: RIGHT SHOULDER - 2+ VIEW COMPARISON:  Report 05/10/2008 FINDINGS: AC joint degenerative changes.  No fracture or dislocation. IMPRESSION: No acute osseous abnormality Electronically Signed   By: Donavan Foil M.D.   On: 03/17/2017 20:05   Dg Elbow Complete Right  Result Date: 03/17/2017 CLINICAL DATA:  Fall with pain EXAM: RIGHT ELBOW - COMPLETE 3+ VIEW COMPARISON:  None. FINDINGS: Large elbow effusion. No definitive displaced fracture is seen. No dislocation. IMPRESSION: Large elbow effusion, occult fracture cannot be excluded. Short interval radiographic follow-up is recommended. Electronically Signed   By: Donavan Foil M.D.   On: 03/17/2017 20:05   Dg Wrist Complete Right  Result Date: 03/17/2017 CLINICAL DATA:  Fall with wrist pain EXAM: RIGHT WRIST - COMPLETE 3+ VIEW COMPARISON:  None. FINDINGS: Probable bone island in the distal radius. Degenerative changes at the first [facility] joint. No fracture or malalignment. IMPRESSION: No acute osseous abnormality Electronically Signed   By: Donavan Foil M.D.   On: 03/17/2017 20:03   Ct Cervical Spine Wo Contrast  Result Date: 03/17/2017 CLINICAL DATA:  Fall after visit to PCP. EXAM: CT CERVICAL SPINE WITHOUT CONTRAST TECHNIQUE: Multidetector CT imaging of the cervical spine was performed without intravenous contrast. Multiplanar CT image reconstructions were also generated. COMPARISON:  Neck CTA 10/31/2013 FINDINGS: Alignment: Slight C3-4 and C7-T1 anterolisthesis, chronic and degenerative. Skull base and vertebrae: Negative for fracture. A hemangioma is seen in the C4 body. Lucency in the posterior C6 vertebral body that is stable from  2015 neck CTA. Soft tissues  and spinal canal: No prevertebral fluid or swelling. No visible canal hematoma. Disc levels: Generalized disc narrowing. Uncovertebral spurs and disc height loss causes foraminal narrowing most notable at C5-6 and C6-7. Cervical facet arthropathy most notable at C7-T1. Upper chest: Negative IMPRESSION: No evidence of cervical spine injury. Electronically Signed   By: Monte Fantasia M.D.   On: 03/17/2017 20:03   Dg Hand Complete Right  Result Date: 03/17/2017 CLINICAL DATA:  Fall with right hand pain EXAM: RIGHT HAND - COMPLETE 3+ VIEW COMPARISON:  None. FINDINGS: No acute displaced fracture or malalignment is seen. Mild degenerative changes at the first MCP and CMC joints. IMPRESSION: No acute osseous abnormality Electronically Signed   By: Donavan Foil M.D.   On: 03/17/2017 20:02    Procedures Procedures (including critical care time) SPLINT APPLICATION Date/Time: 3:71 PM Authorized by: Ocie Cornfield Consent: Verbal consent obtained. Risks and benefits: risks, benefits and alternatives were discussed Consent given by: patient Splint applied by: orthopedic technician Location details: Right arm Splint type: Long-arm Supplies used: Splint material Post-procedure: The splinted body part was neurovascularly unchanged following the procedure. Patient tolerance: Patient tolerated the procedure well with no immediate complications.    Medications Ordered in ED Medications  traMADol (ULTRAM) tablet 50 mg (50 mg Oral Given 03/17/17 2055)     Initial Impression / Assessment and Plan / ED Course  I have reviewed the triage vital signs and the nursing notes.  Pertinent labs & imaging results that were available during my care of the patient were reviewed by me and considered in my medical decision making (see chart for details).     Patient presents to the ED with right-sided pain after mechanical fall 2 and half days ago.  Patient denies any head injury or  LOC.  Specifically patient complains of right lateral rib cage pain right elbow and right wrist pain.  She has been treating herself at home with tramadol and naproxen with some relief.  Patient is neurovascularly intact.  Normal neurological exam without any focal neuro deficits.  Patient has no tenderness to the abdomen.  There is no crepitus or step-off noted in the chest.  Patient has normal breath sounds.  Patient does have limited range of motion of the right elbow and right wrist due to pain.  No signs of intracranial, intrathoracic or intra-abdominal trauma.  Given that the fall occurred 2 days ago with normal vital signs and no abdominal tenderness do not feel that further CT imaging of abdomen or chest indicated at this time.  X-rays were obtained of the right arm and right rib cage that showed no definite fracture.  She does have a large joint effusion to the right elbow.  Suspect occult fracture.  Will place patient in a long arm splint given her wrist pain as well with a sling and follow-up with orthopedics next week.  Patient has no fracture of the rib seen on x-ray.  Suspect occult fracture given her tenderness.  Patient does not want any further pain medication.  She would like to continue taking her tramadol and naproxen at home.  Offered lidocaine patches however patient is allergic to lidocaine.  Patient does have incentive spirometer at home that she will continue to use.  Instructed to keep ice on the area.  Will need to follow-up with primary care doctor next week.  Patient's tetanus shot is up-to-date.  No signs of infection.  We will clean wound and apply antibiotic ointment.  Pt is hemodynamically stable,  in NAD, & able to ambulate in the ED. Evaluation does not show pathology that would require ongoing emergent intervention or inpatient treatment. I explained the diagnosis to the patient. Pain has been managed & has no complaints prior to dc. Pt is comfortable with above plan and is  stable for discharge at this time. All questions were answered prior to disposition. Strict return precautions for f/u to the ED were discussed. Encouraged follow up with PCP.  Patient discussed with my attending who is agreed with the above plan.   Final Clinical Impressions(s) / ED Diagnoses   Final diagnoses:  Fall, initial encounter  Right arm pain  Rib pain on right side    ED Discharge Orders    None       Aaron Edelman 03/17/17 2130    Virgel Manifold, MD 03/17/17 2328

## 2017-03-22 ENCOUNTER — Telehealth (INDEPENDENT_AMBULATORY_CARE_PROVIDER_SITE_OTHER): Payer: Self-pay | Admitting: Orthopaedic Surgery

## 2017-03-22 ENCOUNTER — Ambulatory Visit (INDEPENDENT_AMBULATORY_CARE_PROVIDER_SITE_OTHER): Payer: Medicare Other | Admitting: Orthopaedic Surgery

## 2017-03-22 ENCOUNTER — Other Ambulatory Visit (INDEPENDENT_AMBULATORY_CARE_PROVIDER_SITE_OTHER): Payer: Self-pay

## 2017-03-22 ENCOUNTER — Encounter (INDEPENDENT_AMBULATORY_CARE_PROVIDER_SITE_OTHER): Payer: Self-pay | Admitting: Orthopaedic Surgery

## 2017-03-22 DIAGNOSIS — M79601 Pain in right arm: Secondary | ICD-10-CM | POA: Diagnosis not present

## 2017-03-22 DIAGNOSIS — R05 Cough: Secondary | ICD-10-CM | POA: Diagnosis not present

## 2017-03-22 DIAGNOSIS — R059 Cough, unspecified: Secondary | ICD-10-CM

## 2017-03-22 DIAGNOSIS — R0781 Pleurodynia: Secondary | ICD-10-CM

## 2017-03-22 MED ORDER — BENZONATATE 100 MG PO CAPS: 100.0000 mg | ORAL_CAPSULE | Freq: Every evening | ORAL | 0 refills | Status: DC | PRN

## 2017-03-22 MED ORDER — BENZONATATE 100 MG PO CAPS
100.0000 mg | ORAL_CAPSULE | Freq: Every day | ORAL | Status: AC
Start: 2017-03-22 — End: 2017-04-11

## 2017-03-22 NOTE — Progress Notes (Signed)
Office Visit Note   Patient: Stacey Velazquez           Date of Birth: May 09, 1945           MRN: [id number] Visit Date: 03/22/2017              Requested by: Charlott Rakes, MD [place], [place] [address] PCP: Charlott Rakes, MD   Assessment & Plan: Visit Diagnoses:  1. Cough   2. Rib pain   3. Arm pain, diffuse, right     Plan: We will have her work on range of motion of the elbow wrist and hand on the right.  She can use sling for comfort.  Did discuss with her that occasionally this may last for several months.  In regards to her ribs again a contusion to the last up to 3 months she has an incentive spirometer at home and I advised her to continue to use this to work on deep breathing.  Did call in some Tessalon [place] to help with her cough so she can get some rest at night.  She has a follow-up with her primary care physician in late March.  We will see her back as needed.  Follow-Up Instructions: Return if symptoms worsen or fail to improve.   Orders:  No orders of the defined types were placed in this encounter.  Meds ordered this encounter  Medications  . benzonatate (TESSALON) capsule 100 mg      Procedures: No procedures performed   Clinical Data: No additional findings.   Subjective: Chief Complaint  Patient presents with  . Right Wrist - Injury  . Right Hand - Injury    HPI Stacey Velazquez is well-known by my service comes in today due to right arm rib and right wrist pain after a fall on 03/14/2017.  This is a mechanical fall she did not have any loss of consciousness dizziness or injury to her head.  She states that she fell mainly on her left elbow.  She also notes that she has an upper respiratory infection/cough and is having difficulty sleeping due to the cough at night.  This is also increasing her right lower rib pain.  States this is a nonproductive cough.  She is using incentive spirometer at home.  Radiographs right elbow multiple  views shows no acute fracture no bony abnormalities no dislocation subluxation.  Right wrist films knee show no acute fracture of the right wrist no bony abnormalities no acute fracture of the carpal bones or malalignment of the carpal bones.  Chest film with multiple views of the ribs shows no displaced rib fractures.  No evidence of pneumothorax. Review of Systems No fevers chills shortness of breath chest pain.  Objective: Vital Signs: There were no vitals taken for this visit.  Physical Exam  Constitutional: She is oriented to person, place, and time. She appears well-developed and well-nourished. No distress.  Pulmonary/Chest: Effort normal.  Neurological: She is alert and oriented to person, place, and time.  Skin: She is not diaphoretic.  Psychiatric: She has a normal mood and affect.    Ortho Exam Tenderness over the anterior and posterior right lower ribs.  No ecchymosis appreciated about the rib cage.  Right elbow has full range of motion.  She has some tenderness over the olecranon region.  No rashes skin lesions ulcerations or ecchymosis.  She has full supination pronation of the right forearm.  Bruising over the volar aspect of the wrist.  Diffuse tenderness about the wrist.  No gross deformity.  Full sensation and motor of the right hand without pain.  Radial pulses intact. Specialty Comments:  No specialty comments available.  Imaging: No results found.   PMFS History: Patient Active Problem List   Diagnosis Date Noted  . Vertigo   . Hypotension   . Heart disease   . GERD (gastroesophageal reflux disease)   . Autoimmune hepatitis ([place])   . Anxiety   . Low back pain 10/18/2016  . Gingival abscess 08/26/2016  . Acute right hip pain 08/26/2016  . Seizure disorder ([place]) 10/14/2014  . Drug-induced Parkinson's disease ([place]) 10/14/2014  . Hypothyroidism 09/09/2014  . Azathioprine-induced leukopenia ([place]) 08/25/2014  . Thrombocytopenia due to drugs 08/25/2014  .  Hepatitis, autoimmune ([place]) 08/25/2014  . Syncope   . Seizures ([place])   . Sinus arrest   . Pacemaker Medtronic   . Bipolar 1 disorder ([place])   . Glaucoma 01/25/2001   Past Medical History:  Diagnosis Date  . Anxiety   . Autoimmune hepatitis ([place])   . Bipolar 1 disorder ([place])   . GERD (gastroesophageal reflux disease)   . Glaucoma 2003   . Heart disease   . Hepatitis, autoimmune ([place]) 08/25/2014  . Hypotension   . Hypothyroidism   . Pacemaker Medtronic    MRI compatible  . Seizures ([place])    last sz 08/21/14  . Sinus arrest   . Vertigo    bvvp    Family History  Problem Relation Age of Onset  . Heart attack Father   . Heart disease Paternal Uncle   . Heart disease Paternal Grandmother   . Diabetes Paternal Grandmother   . Heart disease Paternal Grandfather   . Cancer Mother        BREAST  . Breast cancer Mother   . Cancer Maternal Grandmother   . CVA Maternal Grandmother   . Cancer Maternal Grandfather   . CVA Maternal Grandfather   . Diabetes Sister   . Diabetes Brother   . Diabetes Brother   . Stroke Daughter     Past Surgical History:  Procedure Laterality Date  . CATARACT EXTRACTION Left 2008  . KNEE SURGERY Left 1985  . PACEMAKER INSERTION  2014  . PARTIAL HYSTERECTOMY  1979   Social History   Occupational History  . Not on file  Tobacco Use  . Smoking status: Former Smoker    Last attempt to quit: 03/02/2003    Years since quitting: 14.0  . Smokeless tobacco: Never Used  Substance and Sexual Activity  . Alcohol use: No  . Drug use: No  . Sexual activity: Not on file

## 2017-03-22 NOTE — Telephone Encounter (Signed)
Patient is at the pharmacy now, they state they did not receive the RX for the Tessalon if it could be sent in again. Thank you.

## 2017-03-22 NOTE — Telephone Encounter (Signed)
Sent in

## 2017-03-29 ENCOUNTER — Ambulatory Visit: Payer: Medicare Other | Admitting: Family Medicine

## 2017-04-11 ENCOUNTER — Ambulatory Visit: Payer: Medicare Other | Admitting: Family Medicine

## 2017-04-18 ENCOUNTER — Ambulatory Visit: Payer: Medicare Other | Attending: Family Medicine | Admitting: Family Medicine

## 2017-04-18 ENCOUNTER — Encounter: Payer: Self-pay | Admitting: Family Medicine

## 2017-04-18 VITALS — BP 99/75 | HR 104 | Temp 97.7°F | Ht 69.0 in | Wt 229.0 lb

## 2017-04-18 DIAGNOSIS — W19XXXA Unspecified fall, initial encounter: Secondary | ICD-10-CM | POA: Insufficient documentation

## 2017-04-18 DIAGNOSIS — Z885 Allergy status to narcotic agent status: Secondary | ICD-10-CM | POA: Insufficient documentation

## 2017-04-18 DIAGNOSIS — R05 Cough: Secondary | ICD-10-CM | POA: Diagnosis not present

## 2017-04-18 DIAGNOSIS — R55 Syncope and collapse: Secondary | ICD-10-CM | POA: Insufficient documentation

## 2017-04-18 DIAGNOSIS — H409 Unspecified glaucoma: Secondary | ICD-10-CM | POA: Insufficient documentation

## 2017-04-18 DIAGNOSIS — K754 Autoimmune hepatitis: Secondary | ICD-10-CM | POA: Insufficient documentation

## 2017-04-18 DIAGNOSIS — E039 Hypothyroidism, unspecified: Secondary | ICD-10-CM | POA: Insufficient documentation

## 2017-04-18 DIAGNOSIS — M25531 Pain in right wrist: Secondary | ICD-10-CM | POA: Diagnosis not present

## 2017-04-18 DIAGNOSIS — F419 Anxiety disorder, unspecified: Secondary | ICD-10-CM | POA: Diagnosis not present

## 2017-04-18 DIAGNOSIS — F319 Bipolar disorder, unspecified: Secondary | ICD-10-CM | POA: Diagnosis not present

## 2017-04-18 DIAGNOSIS — Z79899 Other long term (current) drug therapy: Secondary | ICD-10-CM | POA: Diagnosis not present

## 2017-04-18 DIAGNOSIS — Z888 Allergy status to other drugs, medicaments and biological substances status: Secondary | ICD-10-CM | POA: Insufficient documentation

## 2017-04-18 DIAGNOSIS — Z950 Presence of cardiac pacemaker: Secondary | ICD-10-CM | POA: Insufficient documentation

## 2017-04-18 DIAGNOSIS — Z23 Encounter for immunization: Secondary | ICD-10-CM | POA: Diagnosis not present

## 2017-04-18 DIAGNOSIS — R059 Cough, unspecified: Secondary | ICD-10-CM

## 2017-04-18 DIAGNOSIS — K219 Gastro-esophageal reflux disease without esophagitis: Secondary | ICD-10-CM | POA: Diagnosis not present

## 2017-04-18 DIAGNOSIS — R569 Unspecified convulsions: Secondary | ICD-10-CM | POA: Insufficient documentation

## 2017-04-18 MED ORDER — PNEUMOCOCCAL 13-VAL CONJ VACC IM SUSP: 0.5000 mL | INTRAMUSCULAR | 0 refills | Status: DC

## 2017-04-18 MED ORDER — TETANUS-DIPHTH-ACELL PERTUSSIS 5-2.5-18.5 LF-MCG/0.5 IM SUSP: 0.5000 mL | Freq: Once | INTRAMUSCULAR | 0 refills | Status: AC

## 2017-04-18 MED FILL — BOOSTRIX VACCINE SYRINGE: 5-2.5-18.5 | 1 days supply | Qty: 1 | Fill #0

## 2017-04-18 NOTE — Progress Notes (Signed)
Subjective:  Patient ID: Stacey Velazquez, female    DOB: Jul 05, 1945  Age: 72 y.o. MRN: [id number]  CC: Hypothyroidism and Cough   HPI Stacey Velazquez  is a 72 year old female with a history of seizures, autoimmune hepatitis (followed by GI), hypothyroidism, bipolar disorder, drug induced Parkinson's, sinus node dysfunction s/p medtronic pacemaker in 12/2012(after syncope) who presents today for a follow up visit.  She took a fall in 03/14/17 with resulting right lateral rib cage pain, right elbow and right wrist pain;  imaging was negative for fractures or dislocation and she was seen at the ED and also by [facility] orthopedics.  She reports resolution of symptoms.  She complains of a dry cough which wakes her up at night and has been present for the last 4-6 weeks; received Tessalon Perles from her orthopedic which did not help her symptoms and she has been using Delsym.  Endorses intermittent rhinorrhea which clears up after a few minutes but no sore throat, fever or sinus pressure, no dyspnea. Autoimmune hepatitis is managed by GI - Dr Benson [place]. Seizures is managed by [facility] and she reports no recent seizures.  Past Medical History:  Diagnosis Date  . Anxiety   . Autoimmune hepatitis ([place])   . Bipolar 1 disorder ([place])   . GERD (gastroesophageal reflux disease)   . Glaucoma 2003   . Heart disease   . Hepatitis, autoimmune ([place]) 08/25/2014  . Hypotension   . Hypothyroidism   . Pacemaker Medtronic    MRI compatible  . Seizures ([place])    last sz 08/21/14  . Sinus arrest   . Vertigo    bvvp    Past Surgical History:  Procedure Laterality Date  . CATARACT EXTRACTION Left 2008  . KNEE SURGERY Left 1985  . PACEMAKER INSERTION  2014  . PARTIAL HYSTERECTOMY  1979    Allergies  Allergen Reactions  . Ambien [Zolpidem Tartrate] Other (See Comments)    Hallucinations   . Amitriptyline Other (See Comments)    Stops heart per pt  . Ampicillin Other (See Comments)     Stops heart per pt   . Anaprox [Naproxen Sodium] Other (See Comments)    Doesn't work per pt  . Benadryl [Diphenhydramine Hcl (Sleep)] Other (See Comments)    Rash  . Cetirizine & Related Other (See Comments)    Swelling   . Cortizone-10 [Hydrocortisone] Other (See Comments)    seizures   . Darvon [Propoxyphene] Other (See Comments)    Increased heart rate per pt   . Diazepam Other (See Comments)    Stops heart per pt  . Diflunisal Swelling and Other (See Comments)    Stops heart per pt (Dolobid)  . Duloxetine Other (See Comments)    Reaction to Cymbalta - pt doesn't remember what the reaction was  . Flexeril [Cyclobenzaprine] Other (See Comments)    Seizures   . Lactose Intolerance (Gi) Swelling and Other (See Comments)    cramping  . Lidocaine Other (See Comments)    Seizures  . Meperidine And Related Other (See Comments)    Stops heart rate (reaction to Demerol)  . Metanx [L-Methylfolate-Algae-B12-B6] Other (See Comments)    Pt does not remember reaction  . Metoclopramide Other (See Comments)    Pt does not remember reaction  . Morphine And Related Other (See Comments)    Stops heart per pt   . Nuprin [Ibuprofen] Other (See Comments)    Caused headache  . Oxycodone Other (See Comments)  Hallucinations   . Penicillins Other (See Comments)    Stops heart per pt   . Propoxyphene Other (See Comments)    Slowed heart rate per pt (reaction to Darvocet)  . Ranitidine Other (See Comments)    Pt does not remember reaction  . Vicodin [Hydrocodone-Acetaminophen] Other (See Comments)    Seizures   . Tylenol [Acetaminophen] Rash     Outpatient Medications Prior to Visit  Medication Sig Dispense Refill  . azaTHIOprine (IMURAN) 50 MG tablet Take 50 mg by mouth 3 (three) times daily.    . benztropine (COGENTIN) 1 MG tablet Take 1 mg by mouth 2 (two) times daily.   0  . Cholecalciferol (VITAMIN D) 1000 UNITS capsule Take 1,000 Units by mouth daily.    .  clindamycin (CLEOCIN) 150 MG capsule Take 1 capsule (150 mg total) by mouth 3 (three) times daily. 30 capsule 0  . furosemide (LASIX) 20 MG tablet Take two tablets(40 mg) by mouth for three days then resume taking one tablet (20 mg ) by mouth daily. 93 tablet 3  . Lactobacillus (ACIDOPHOLUS PO) Take 1 tablet by mouth daily as needed (lactose intolerance).    . levETIRAcetam (KEPPRA) 500 MG tablet take 1 tablet by mouth twice a day 180 tablet 1  . levothyroxine (SYNTHROID, LEVOTHROID) 100 MCG tablet Take 1 tablet (100 mcg total) by mouth daily before breakfast. 30 tablet 2  . LINZESS 290 MCG CAPS capsule   1  . meclizine (ANTIVERT) 12.5 MG tablet Take 1 tablet (12.5 mg total) by mouth 3 (three) times daily as needed for dizziness. 90 tablet 2  . naproxen (NAPROSYN) 500 MG tablet Take 1 tablet (500 mg total) by mouth 2 (two) times daily with a meal. 60 tablet 2  . potassium chloride SA (K-DUR,KLOR-CON) 20 MEQ tablet Take 1 tablet (20 mEq total) by mouth daily. 30 tablet 3  . predniSONE (DELTASONE) 5 MG tablet Take 0.5 tablets by mouth daily.    . VRAYLAR 1.5 MG CAPS Take 1 capsule by mouth at bedtime.    . benzonatate (TESSALON PERLES) 100 MG capsule Take 1 capsule (100 mg total) by mouth at bedtime as needed for cough. (Patient not taking: Reported on 04/18/2017) 30 capsule 0  . traMADol (ULTRAM) 50 MG tablet Take 1 tablet (50 mg total) by mouth every 8 (eight) hours as needed for severe pain. (Patient not taking: Reported on 04/18/2017) 20 tablet 0   No facility-administered medications prior to visit.     ROS Review of Systems  Constitutional: Negative for activity change, appetite change and fatigue.  HENT: Negative for congestion, sinus pressure and sore throat.   Eyes: Negative for visual disturbance.  Respiratory: Positive for cough. Negative for chest tightness, shortness of breath and wheezing.   Cardiovascular: Negative for chest pain and palpitations.  Gastrointestinal: Negative for  abdominal distention, abdominal pain and constipation.  Endocrine: Negative for polydipsia.  Genitourinary: Negative for dysuria and frequency.  Musculoskeletal: Negative for arthralgias and back pain.  Skin: Negative for rash.  Neurological: Negative for tremors, light-headedness and numbness.  Hematological: Does not bruise/bleed easily.  Psychiatric/Behavioral: Negative for agitation and behavioral problems.    Objective:  BP 99/75   Pulse (!) 104   Temp 97.7 F (36.5 C) (Oral)   Ht 5' 9" (1.753 m)   Wt 210 lb (95.3 kg)   SpO2 96%   BMI 31.01 kg/m   BP/Weight 04/18/2017 03/17/2017 01/12/2017  Systolic BP 99 136 130  Diastolic BP 75   98 86  Wt. (Lbs) 210 245 247.8  BMI 31.01 36.18 36.59      Physical Exam  Constitutional: She is oriented to person, place, and time. She appears well-developed and well-nourished.  Cardiovascular: Normal rate, normal heart sounds and intact distal pulses.  No murmur heard. Pulmonary/Chest: Effort normal and breath sounds normal. She has no wheezes. She has no rales. She exhibits no tenderness.  Abdominal: Soft. Bowel sounds are normal. She exhibits no distension and no mass. There is no tenderness.  Musculoskeletal: Normal range of motion.  Neurological: She is alert and oriented to person, place, and time. Abnormal coordination: tremors.  Skin: Skin is warm and dry.  Psychiatric: She has a normal mood and affect.     Assessment & Plan:   1. Hypothyroidism, unspecified type Continue levothyroxine We will adjust dose if indicated after TSH is obtained - TSH  2. Hepatitis, autoimmune ([place]) Continue Imuran - CMP14+EGFR - CBC with Differential/Platelet  3. Bipolar 1 disorder (HCC) Stable Management as per [facility]  4. Cough She most likely has cough as a result of postnasal drip Unable to place on Zyrtec or Flonase as she is allergic Continue Delsym  5. Need for pneumococcal vaccination Pneumovax 13 administered  6. Need for  Tdap vaccination Tdap administered   Meds ordered this encounter  Medications  . Tdap (BOOSTRIX) 5-2.5-18.5 LF-MCG/0.5 injection    Sig: Inject 0.5 mLs into the muscle once for 1 dose.    Dispense:  0.5 mL    Refill:  0  . pneumococcal 13-valent conjugate vaccine (PREVNAR 13) SUSP injection    Sig: Inject 0.5 mLs into the muscle tomorrow at 10 am for 1 dose.    Dispense:  0.5 mL    Refill:  0    Follow-up: Return in about 6 months (around 10/19/2017) for follow up of chronic medical conditions.   Charlott Rakes MD

## 2017-04-18 NOTE — Progress Notes (Signed)
Patient has a dry cough.

## 2017-04-19 ENCOUNTER — Other Ambulatory Visit: Payer: Self-pay | Admitting: Family Medicine

## 2017-04-19 DIAGNOSIS — E039 Hypothyroidism, unspecified: Secondary | ICD-10-CM

## 2017-04-19 LAB — CBC WITH DIFFERENTIAL/PLATELET
Basophils Absolute: 0 10*3/uL (ref 0.0–0.2)
Basos: 0 %
EOS (ABSOLUTE): 0 10*3/uL (ref 0.0–0.4)
Eos: 1 %
Hematocrit: 44.8 % (ref 34.0–46.6)
Hemoglobin: 15.2 g/dL (ref 11.1–15.9)
Immature Grans (Abs): 0 10*3/uL (ref 0.0–0.1)
Immature Granulocytes: 0 %
Lymphocytes Absolute: 0.6 10*3/uL — ABNORMAL LOW (ref 0.7–3.1)
Lymphs: 21 %
MCH: 32.2 pg (ref 26.6–33.0)
MCHC: 33.9 g/dL (ref 31.5–35.7)
MCV: 95 fL (ref 79–97)
Monocytes Absolute: 0.4 10*3/uL (ref 0.1–0.9)
Monocytes: 14 %
Neutrophils Absolute: 1.9 10*3/uL (ref 1.4–7.0)
Neutrophils: 64 %
Platelets: 114 10*3/uL — ABNORMAL LOW (ref 150–379)
RBC: 4.72 x10E6/uL (ref 3.77–5.28)
RDW: 15.8 % — ABNORMAL HIGH (ref 12.3–15.4)
WBC: 2.9 10*3/uL — ABNORMAL LOW (ref 3.4–10.8)

## 2017-04-19 LAB — CMP14+EGFR
ALT: 32 IU/L (ref 0–32)
AST: 43 IU/L — ABNORMAL HIGH (ref 0–40)
Albumin/Globulin Ratio: 0.9 — ABNORMAL LOW (ref 1.2–2.2)
Albumin: 3.7 g/dL (ref 3.5–4.8)
Alkaline Phosphatase: 84 IU/L (ref 39–117)
BUN/Creatinine Ratio: 23 (ref 12–28)
BUN: 18 mg/dL (ref 8–27)
Bilirubin Total: 0.5 mg/dL (ref 0.0–1.2)
CO2: 18 mmol/L — ABNORMAL LOW (ref 20–29)
Calcium: 9 mg/dL (ref 8.7–10.3)
Chloride: 105 mmol/L (ref 96–106)
Creatinine, Ser: 0.79 mg/dL (ref 0.57–1.00)
GFR calc Af Amer: 86 mL/min/{1.73_m2} (ref >59)
GFR calc non Af Amer: 75 mL/min/{1.73_m2} (ref >59)
Globulin, Total: 3.9 g/dL (ref 1.5–4.5)
Glucose: 106 mg/dL — ABNORMAL HIGH (ref 65–99)
Potassium: 3.9 mmol/L (ref 3.5–5.2)
Sodium: 140 mmol/L (ref 134–144)
Total Protein: 7.6 g/dL (ref 6.0–8.5)

## 2017-04-19 LAB — TSH: TSH: 1.58 u[IU]/mL (ref 0.450–4.500)

## 2017-04-19 MED ORDER — LEVOTHYROXINE SODIUM 100 MCG PO TABS: 100.0000 ug | ORAL_TABLET | Freq: Every day | ORAL | 3 refills | Status: DC

## 2017-04-21 ENCOUNTER — Telehealth: Payer: Self-pay

## 2017-04-21 NOTE — Telephone Encounter (Signed)
Patient was called and informed of lab results. 

## 2017-04-22 ENCOUNTER — Telehealth: Payer: Self-pay | Admitting: Family Medicine

## 2017-04-22 DIAGNOSIS — E2839 Other primary ovarian failure: Secondary | ICD-10-CM

## 2017-04-22 NOTE — Telephone Encounter (Signed)
Patient called asking for a bone density test to be ordered. Please call patient once it is complete

## 2017-04-25 NOTE — Telephone Encounter (Signed)
Patient was called and a voicemail was left informing patient to contact [facility] to schedule bone density.

## 2017-04-25 NOTE — Telephone Encounter (Signed)
Order has been placed. Alycia could you please inform the patient? I am unsure if they will be in touch or if she needs to call to set up an appointment. Thanks

## 2017-04-26 ENCOUNTER — Other Ambulatory Visit: Payer: Self-pay | Admitting: Family Medicine

## 2017-04-26 DIAGNOSIS — Z139 Encounter for screening, unspecified: Secondary | ICD-10-CM

## 2017-05-11 ENCOUNTER — Other Ambulatory Visit: Payer: Self-pay

## 2017-05-11 ENCOUNTER — Emergency Department (HOSPITAL_COMMUNITY)
Admission: EM | Admit: 2017-05-11 | Discharge: 2017-05-12 | Disposition: A | Discharge status: Home / Self Care | Payer: Medicare Other | Attending: Emergency Medicine | Admitting: Emergency Medicine

## 2017-05-11 ENCOUNTER — Encounter (HOSPITAL_COMMUNITY): Payer: Self-pay | Admitting: Emergency Medicine

## 2017-05-11 DIAGNOSIS — F22 Delusional disorders: Secondary | ICD-10-CM | POA: Diagnosis not present

## 2017-05-11 DIAGNOSIS — E876 Hypokalemia: Secondary | ICD-10-CM | POA: Diagnosis not present

## 2017-05-11 DIAGNOSIS — Z950 Presence of cardiac pacemaker: Secondary | ICD-10-CM | POA: Diagnosis not present

## 2017-05-11 DIAGNOSIS — F319 Bipolar disorder, unspecified: Secondary | ICD-10-CM | POA: Insufficient documentation

## 2017-05-11 DIAGNOSIS — Z79899 Other long term (current) drug therapy: Secondary | ICD-10-CM | POA: Diagnosis not present

## 2017-05-11 DIAGNOSIS — E039 Hypothyroidism, unspecified: Secondary | ICD-10-CM | POA: Diagnosis not present

## 2017-05-11 DIAGNOSIS — Z87891 Personal history of nicotine dependence: Secondary | ICD-10-CM | POA: Insufficient documentation

## 2017-05-11 NOTE — ED Triage Notes (Signed)
Pt brought in by [employer]  Pt lives in an independent living facility and today was found standing outside at 8am this morning  When the property manager came home this evening at 5pm she was still there and tonight at 32PQ when the police arrived pt was still outside standing there staring off into space  Pt refused to go inside and did not want to come with police  Pt did not recognize her daughter and would not listen to her either  Pt brought in for evaluation  Pt has hx of bipolar

## 2017-05-12 LAB — CBC
HCT: 43.8 % (ref 36.0–46.0)
Hemoglobin: 15.6 g/dL — ABNORMAL HIGH (ref 12.0–15.0)
MCH: 33.1 pg (ref 26.0–34.0)
MCHC: 35.6 g/dL (ref 30.0–36.0)
MCV: 93 fL (ref 78.0–100.0)
Platelets: 123 10*3/uL — ABNORMAL LOW (ref 150–400)
RBC: 4.71 MIL/uL (ref 3.87–5.11)
RDW: 15.7 % — ABNORMAL HIGH (ref 11.5–15.5)
WBC: 5.2 10*3/uL (ref 4.0–10.5)

## 2017-05-12 LAB — URINALYSIS, ROUTINE W REFLEX MICROSCOPIC
Bilirubin Urine: NEGATIVE
Glucose, UA: NEGATIVE mg/dL
Ketones, ur: 20 mg/dL — AB
Nitrite: NEGATIVE
Protein, ur: 30 mg/dL — AB
Specific Gravity, Urine: 1.024 (ref 1.005–1.030)
pH: 6 (ref 5.0–8.0)

## 2017-05-12 LAB — RAPID URINE DRUG SCREEN, HOSP PERFORMED
Amphetamines: NOT DETECTED
Barbiturates: NOT DETECTED
Benzodiazepines: NOT DETECTED
Cocaine: NOT DETECTED
Opiates: NOT DETECTED
Tetrahydrocannabinol: NOT DETECTED

## 2017-05-12 LAB — ACETAMINOPHEN LEVEL: Acetaminophen (Tylenol), Serum: <10 ug/mL — ABNORMAL LOW (ref 10–30)

## 2017-05-12 LAB — COMPREHENSIVE METABOLIC PANEL WITH GFR
ALT: 41 U/L (ref 14–54)
AST: 59 U/L — ABNORMAL HIGH (ref 15–41)
Albumin: 3.5 g/dL (ref 3.5–5.0)
Alkaline Phosphatase: 69 U/L (ref 38–126)
Anion gap: 15 (ref 5–15)
BUN: 34 mg/dL — ABNORMAL HIGH (ref 6–20)
CO2: 16 mmol/L — ABNORMAL LOW (ref 22–32)
Calcium: 8.8 mg/dL — ABNORMAL LOW (ref 8.9–10.3)
Chloride: 104 mmol/L (ref 101–111)
Creatinine, Ser: 1.01 mg/dL — ABNORMAL HIGH (ref 0.44–1.00)
GFR calc Af Amer: >60 mL/min
GFR calc non Af Amer: 54 mL/min — ABNORMAL LOW
Glucose, Bld: 98 mg/dL (ref 65–99)
Potassium: 2.9 mmol/L — ABNORMAL LOW (ref 3.5–5.1)
Sodium: 135 mmol/L (ref 135–145)
Total Bilirubin: 2.2 mg/dL — ABNORMAL HIGH (ref 0.3–1.2)
Total Protein: 7.5 g/dL (ref 6.5–8.1)

## 2017-05-12 LAB — ETHANOL: Alcohol, Ethyl (B): <10 mg/dL

## 2017-05-12 LAB — SALICYLATE LEVEL: Salicylate Lvl: <7 mg/dL (ref 2.8–30.0)

## 2017-05-12 MED ORDER — POTASSIUM CHLORIDE 20 MEQ/15ML (10%) PO SOLN: 20.0000 meq | Freq: Once | ORAL | 0 refills | Status: DC

## 2017-05-12 MED ORDER — FOSFOMYCIN TROMETHAMINE 3 G PO PACK
3.0000 g | PACK | Freq: Once | ORAL | Status: AC
  Administered 2017-05-12: 3 g via ORAL
  Filled 2017-05-12: qty 3

## 2017-05-12 MED ORDER — ALUM & MAG HYDROXIDE-SIMETH 200-200-20 MG/5ML PO SUSP: 30.0000 mL | Freq: Four times a day (QID) | ORAL | Status: DC | PRN

## 2017-05-12 MED ORDER — POTASSIUM CHLORIDE 20 MEQ/15ML (10%) PO SOLN
20.0000 meq | Freq: Once | ORAL | Status: DC
  Filled 2017-05-12: qty 15

## 2017-05-12 MED ORDER — POTASSIUM CHLORIDE CRYS ER 20 MEQ PO TBCR: 40.0000 meq | EXTENDED_RELEASE_TABLET | Freq: Every day | ORAL | Status: DC

## 2017-05-12 MED ORDER — POTASSIUM CHLORIDE 20 MEQ/15ML (10%) PO SOLN: 20.0000 meq | Freq: Every day | ORAL | 0 refills | Status: DC

## 2017-05-12 MED ORDER — POTASSIUM CHLORIDE CRYS ER 20 MEQ PO TBCR: 40.0000 meq | EXTENDED_RELEASE_TABLET | Freq: Once | ORAL | Status: DC

## 2017-05-12 NOTE — Progress Notes (Signed)
CSW intern attempted to call patients daughter, Carmela Rima [phone]. However this wrier left VM and is awaiting call back to move forward with transportation needs  Gean Quint, Social Work Intern  [phone]

## 2017-05-12 NOTE — ED Notes (Signed)
No answer from [facility]. Voicemail left. Will speak with social work for further management.

## 2017-05-12 NOTE — ED Notes (Signed)
Social work to keep [place] updated.

## 2017-05-12 NOTE — ED Notes (Signed)
Pt requested that I called her brother to pick her up. Her brother's number is not in her chart and she is unable to provide me with it. Will look under her contact information in her demographics.

## 2017-05-12 NOTE — Discharge Instructions (Addendum)
Take potassium as prescribed for the next 14 days and have your potassium rechecked by your regular doctor. It was somewhat low in the ER

## 2017-05-12 NOTE — ED Notes (Signed)
Bed: WA30 Expected date:  Expected time:  Means of arrival:  Comments: 

## 2017-05-12 NOTE — ED Provider Notes (Signed)
[facility] DEPT Provider Note: Georgena Spurling, MD, FACEP  CSN: [id number] MRN: [id number] ARRIVAL: 05/11/17 at 2240 ROOM: [place]  IVC   HISTORY OF PRESENT ILLNESS  05/12/17 4:21 AM Stacey Velazquez is a 72 y.o. female who was involuntarily committed by her daughter.  Commitment papers states the patient has a history of bipolar disorder and has not been taking her medications.  She has been wandering in the road without a clear idea of where she is or how to get back to her apartment.  She reportedly refused to enter her apartment yesterday evening because she believes that strangers are in their to harm her.  She reportedly hears them talking and believes they are hiding drugs and spy equipment.  The patient denies all the above.  She denies any complaint at all, including a negative review of systems.   Past Medical History:  Diagnosis Date  . Anxiety   . Autoimmune hepatitis ([place])   . Bipolar 1 disorder ([place])   . GERD (gastroesophageal reflux disease)   . Glaucoma 2003   . Heart disease   . Hepatitis, autoimmune ([place]) 08/25/2014  . Hypotension   . Hypothyroidism   . Pacemaker Medtronic    MRI compatible  . Seizures ([place])    last sz 08/21/14  . Sinus arrest   . Vertigo    bvvp    Past Surgical History:  Procedure Laterality Date  . CATARACT EXTRACTION Left 2008  . KNEE SURGERY Left 1985  . PACEMAKER INSERTION  2014  . PARTIAL HYSTERECTOMY  1979    Family History  Problem Relation Age of Onset  . Heart attack Father   . Heart disease Paternal Uncle   . Heart disease Paternal Grandmother   . Diabetes Paternal Grandmother   . Heart disease Paternal Grandfather   . Cancer Mother        BREAST  . Breast cancer Mother   . Cancer Maternal Grandmother   . CVA Maternal Grandmother   . Cancer Maternal Grandfather   . CVA Maternal Grandfather   . Diabetes Sister   . Diabetes Brother   . Diabetes Brother   . Stroke Daughter     Social  History   Tobacco Use  . Smoking status: Former Smoker    Last attempt to quit: 03/02/2003    Years since quitting: 14.2  . Smokeless tobacco: Never Used  Substance Use Topics  . Alcohol use: No  . Drug use: No    Prior to Admission medications   Medication Sig Start Date End Date Taking? Authorizing Provider  azaTHIOprine (IMURAN) 50 MG tablet Take 50 mg by mouth 3 (three) times daily.    [provider]  benzonatate (TESSALON PERLES) 100 MG capsule Take 1 capsule (100 mg total) by mouth at bedtime as needed for cough. Patient not taking: Reported on 04/18/2017 03/22/17   Mcarthur Rossetti, MD  benztropine (COGENTIN) 1 MG tablet Take 1 mg by mouth 2 (two) times daily.  06/02/15   [provider]  Cholecalciferol (VITAMIN D) 1000 UNITS capsule Take 1,000 Units by mouth daily.    [provider]  clindamycin (CLEOCIN) 150 MG capsule Take 1 capsule (150 mg total) by mouth 3 (three) times daily. Patient not taking: Reported on 05/11/2017 08/26/16   Boykin Nearing, MD  furosemide (LASIX) 20 MG tablet Take two tablets(40 mg) by mouth for three days then resume taking one tablet (20 mg ) by mouth daily. Patient  not taking: Reported on 05/11/2017 12/28/16   [name], MD  levETIRAcetam (KEPPRA) 500 MG tablet take 1 tablet by mouth twice a day Patient not taking: Reported on 05/11/2017 01/26/17   Penumalli, Earlean Polka, MD  levothyroxine (SYNTHROID, LEVOTHROID) 100 MCG tablet Take 1 tablet (100 mcg total) by mouth daily before breakfast. Patient not taking: Reported on 05/11/2017 04/19/17   Charlott Rakes, MD  meclizine (ANTIVERT) 12.5 MG tablet Take 1 tablet (12.5 mg total) by mouth 3 (three) times daily as needed for dizziness. Patient not taking: Reported on 05/11/2017 08/02/14   Lorayne Marek, MD  naproxen (NAPROSYN) 500 MG tablet Take 1 tablet (500 mg total) by mouth 2 (two) times daily with a meal. Patient not taking: Reported on 05/11/2017 01/12/17   Charlott Rakes,  MD  potassium chloride SA (K-DUR,KLOR-CON) 20 MEQ tablet Take 1 tablet (20 mEq total) by mouth daily. Patient not taking: Reported on 05/11/2017 05/07/15   Boykin Nearing, MD  traMADol (ULTRAM) 50 MG tablet Take 1 tablet (50 mg total) by mouth every 8 (eight) hours as needed for severe pain. Patient not taking: Reported on 04/18/2017 03/17/17   Ocie Cornfield T, PA-C  VRAYLAR 1.5 MG CAPS Take 1 capsule by mouth at bedtime. 11/18/15   [provider]    Allergies Ambien [zolpidem tartrate]; Amitriptyline; Ampicillin; Anaprox [naproxen sodium]; Benadryl [diphenhydramine hcl (sleep)]; Cetirizine & related; Cortizone-10 [hydrocortisone]; Darvon [propoxyphene]; Diazepam; Diflunisal; Duloxetine; Flexeril [cyclobenzaprine]; Lactose intolerance (gi); Lidocaine; Meperidine and related; Metanx [l-methylfolate-algae-b12-b6]; Metoclopramide; Morphine and related; Nuprin [ibuprofen]; Oxycodone; Penicillins; Propoxyphene; Ranitidine; Vicodin [hydrocodone-acetaminophen]; and Tylenol [acetaminophen]   REVIEW OF SYSTEMS  Negative except as noted here or in the History of Present Illness.   PHYSICAL EXAMINATION  Initial Vital Signs Blood pressure (!) 142/98, pulse 93, temperature 98.3 F (36.8 C), temperature source Oral, resp. rate 18, height 5\' 8"  (1.727 m), weight 91.2 kg (201 lb), SpO2 97 %.  Examination General: Well-developed, well-nourished female in no acute distress; appearance consistent with age of record HENT: normocephalic; atraumatic Eyes: pupils equal, round and reactive to light; extraocular muscles intact Neck: supple Heart: regular rate and rhythm Lungs: clear to auscultation bilaterally Abdomen: soft; nondistended; nontender; no masses or hepatosplenomegaly; bowel sounds present Extremities: No deformity; full range of motion; pulses normal Neurologic: Awake, alert and oriented; motor function intact in all extremities and symmetric; no facial droop Skin: Warm and  dry Psychiatric: Flat affect; denies SI or HI   RESULTS  Summary of this visit's results, reviewed by myself:   EKG Interpretation  Date/Time:    Ventricular Rate:    PR Interval:    QRS Duration:   QT Interval:    QTC Calculation:   R Axis:     Text Interpretation:        Laboratory Studies: Results for orders placed or performed during the hospital encounter of 05/11/17 (from the past 24 hour(s))  Comprehensive metabolic panel     Status: Abnormal   Collection Time: 05/12/17  1:31 AM  Result Value Ref Range   Sodium 135 135 - 145 mmol/L   Potassium 2.9 (L) 3.5 - 5.1 mmol/L   Chloride 104 101 - 111 mmol/L   CO2 16 (L) 22 - 32 mmol/L   Glucose, Bld 98 65 - 99 mg/dL   BUN 34 (H) 6 - 20 mg/dL   Creatinine, Ser 1.01 (H) 0.44 - 1.00 mg/dL   Calcium 8.8 (L) 8.9 - 10.3 mg/dL   Total Protein 7.5 6.5 - 8.1 g/dL  Albumin 3.5 3.5 - 5.0 g/dL   AST 59 (H) 15 - 41 U/L   ALT 41 14 - 54 U/L   Alkaline Phosphatase 69 38 - 126 U/L   Total Bilirubin 2.2 (H) 0.3 - 1.2 mg/dL   GFR calc non Af Amer 54 (L) >60 mL/min   GFR calc Af Amer >60 >60 mL/min   Anion gap 15 5 - 15  Ethanol     Status: None   Collection Time: 05/12/17  1:31 AM  Result Value Ref Range   Alcohol, Ethyl (B) <34 <28 mg/dL  Salicylate level     Status: None   Collection Time: 05/12/17  1:31 AM  Result Value Ref Range   Salicylate Lvl <7.6 2.8 - 30.0 mg/dL  Acetaminophen level     Status: Abnormal   Collection Time: 05/12/17  1:31 AM  Result Value Ref Range   Acetaminophen (Tylenol), Serum <10 (L) 10 - 30 ug/mL  cbc     Status: Abnormal   Collection Time: 05/12/17  1:31 AM  Result Value Ref Range   WBC 5.2 4.0 - 10.5 K/uL   RBC 4.71 3.87 - 5.11 MIL/uL   Hemoglobin 15.6 (H) 12.0 - 15.0 g/dL   HCT 43.8 36.0 - 46.0 %   MCV 93.0 78.0 - 100.0 fL   MCH 33.1 26.0 - 34.0 pg   MCHC 35.6 30.0 - 36.0 g/dL   RDW 15.7 (H) 11.5 - 15.5 %   Platelets 123 (L) 150 - 400 K/uL  Rapid urine drug screen (hospital performed)      Status: None   Collection Time: 05/12/17  1:31 AM  Result Value Ref Range   Opiates NONE DETECTED NONE DETECTED   Cocaine NONE DETECTED NONE DETECTED   Benzodiazepines NONE DETECTED NONE DETECTED   Amphetamines NONE DETECTED NONE DETECTED   Tetrahydrocannabinol NONE DETECTED NONE DETECTED   Barbiturates NONE DETECTED NONE DETECTED   Imaging Studies: No results found.  ED COURSE and MDM  Nursing notes and initial vitals signs, including pulse oximetry, reviewed.  Vitals:   05/11/17 2309  BP: (!) 142/98  Pulse: 93  Resp: 18  Temp: 98.3 F (36.8 C)  TempSrc: Oral  SpO2: 97%  Weight: 91.2 kg (201 lb)  Height: 5\' 8"  (1.727 m)   Home medications not ordered as patient states she does not take any medications.  We will have her daughter contacted later this morning to verify her home medications.  We will start potassium as she is hypokalemic.  PROCEDURES    ED DIAGNOSES     ICD-10-CM   1. Delusions ([place]) F22   2. Hypokalemia E87.6        Arleta Ostrum, Jenny Reichmann, MD 05/12/17 [phone]

## 2017-05-12 NOTE — ED Notes (Signed)
Pt found lying on ground by sitter, pt states that she is hot and ground is cold and that is why she is lying on the ground. Pt instructed that the bed is more comfortable than the floor and that she cannot lay on the floor. Pt verbalized understanding by stating "Ok, I wont lay on the ground."

## 2017-05-12 NOTE — Clinical Social Work Note (Signed)
Patient's daughter, Carmela Rima, [phone], indicates that she will come to get patient today when she gets off of work between 5-5:30. She indicated that patient would not get in her vehicle on yesterday.  She stated that patient is off her meds and will not go in to her own apartment.  Ms. Alberteen Spindle stated that she was currently at work, but she would attempt to pick patient up when she got off and transport her to her home.      Petrea Fredenburg, Clydene Pugh, LCSW

## 2017-05-12 NOTE — ED Notes (Signed)
Pt unable to sign due to condition

## 2017-05-12 NOTE — ED Notes (Signed)
Sitter offered to take pt to have a shower and pt refused.

## 2017-05-12 NOTE — ED Notes (Signed)
Pt lying on floor & c/o being hot.  Pt encouraged to get off floor.

## 2017-05-12 NOTE — ED Notes (Signed)
Pt continues to refuse EKG or vital sign recheck.

## 2017-05-12 NOTE — BH Assessment (Signed)
Assessment Note  Stacey Velazquez is a 72 y.o. female who presents to [facility] under IVC by her daughter Carmela Rima [phone]). IVC states the following: Has a diagnosis of Bipolar disorder with psychotic features. Has not been taking her medications. Since last week, respondent was found wandering in the road and parking lot of her apartment complex with no clar idea of where she is and how to get into her apartment. She has refused to re-enter her apartment tonight because she insists strangers are in there that threaten her. She says she hears them talking and believes they are hiding drugs and spy equipment in her apartment. Tonight, Administrator could not get her to go into her apartment and believe that she had been sitting outside since 8:00 am this morning.    Pt is very pleasant and cooperative during assessment. She answers questions appropriately. She denies SI, HI, AVH. Pt discloses that she had been diagnosed with Bipolar, but was told by her psychologist a couple of weeks ago that she actually wasn't Bipolar and took her off her meds. Pt does admit to being worried about going into her apartment b/c there was, reportedly, a man living in the attic (or balcony) of the apartment complex that was playing his music loudly during the night. She says she never saw this man, but knew he was there due to the music playing. Pt also said that there were "other people" that had keys to her apartment and were trying to come in at night and when she wasn't there. She denies that she ever saw these people and indicated that she told the police and they were put in jail @ a week or so ago. Pt wasn't able to say why she was afraid to go into her apartment if the "other people" were currently jailed. Pt confided that it didn't matter anyway b/c she was moving to live with her daughter, [name].   Diagnosis: Bipolar I  Past Medical History:  Past Medical History:  Diagnosis Date  . Anxiety   .  Autoimmune hepatitis ([place])   . Bipolar 1 disorder ([place])   . GERD (gastroesophageal reflux disease)   . Glaucoma 2003   . Heart disease   . Hepatitis, autoimmune ([place]) 08/25/2014  . Hypotension   . Hypothyroidism   . Pacemaker Medtronic    MRI compatible  . Seizures ([place])    last sz 08/21/14  . Sinus arrest   . Vertigo    bvvp    Past Surgical History:  Procedure Laterality Date  . CATARACT EXTRACTION Left 2008  . KNEE SURGERY Left 1985  . PACEMAKER INSERTION  2014  . PARTIAL HYSTERECTOMY  1979    Family History:  Family History  Problem Relation Age of Onset  . Heart attack Father   . Heart disease Paternal Uncle   . Heart disease Paternal Grandmother   . Diabetes Paternal Grandmother   . Heart disease Paternal Grandfather   . Cancer Mother        BREAST  . Breast cancer Mother   . Cancer Maternal Grandmother   . CVA Maternal Grandmother   . Cancer Maternal Grandfather   . CVA Maternal Grandfather   . Diabetes Sister   . Diabetes Brother   . Diabetes Brother   . Stroke Daughter     Social History:  reports that she quit smoking about 14 years ago. She has never used smokeless tobacco. She reports that she does not drink alcohol  or use drugs.  Additional Social History:  Alcohol / Drug Use Pain Medications: see PTA meds Prescriptions: see PTA meds Over the Counter: se PTA meds History of alcohol / drug use?: No history of alcohol / drug abuse  CIWA: CIWA-Ar BP: 129/74 Pulse Rate: 84 COWS:    Allergies:  Allergies  Allergen Reactions  . Ambien [Zolpidem Tartrate] Other (See Comments)    Hallucinations   . Amitriptyline Other (See Comments)    Stops heart per pt  . Ampicillin Other (See Comments)    Stops heart per pt   . Anaprox [Naproxen Sodium] Other (See Comments)    Doesn't work per pt  . Benadryl [Diphenhydramine Hcl (Sleep)] Other (See Comments)    Rash  . Cetirizine & Related Other (See Comments)    Swelling   . Cortizone-10  [Hydrocortisone] Other (See Comments)    seizures   . Darvon [Propoxyphene] Other (See Comments)    Increased heart rate per pt   . Diazepam Other (See Comments)    Stops heart per pt  . Diflunisal Swelling and Other (See Comments)    Stops heart per pt (Dolobid)  . Duloxetine Other (See Comments)    Reaction to Cymbalta - pt doesn't remember what the reaction was  . Flexeril [Cyclobenzaprine] Other (See Comments)    Seizures   . Lactose Intolerance (Gi) Swelling and Other (See Comments)    cramping  . Lidocaine Other (See Comments)    Seizures  . Meperidine And Related Other (See Comments)    Stops heart rate (reaction to Demerol)  . Metanx [L-Methylfolate-Algae-B12-B6] Other (See Comments)    Pt does not remember reaction  . Metoclopramide Other (See Comments)    Pt does not remember reaction  . Morphine And Related Other (See Comments)    Stops heart per pt   . Nuprin [Ibuprofen] Other (See Comments)    Caused headache  . Oxycodone Other (See Comments)    Hallucinations   . Penicillins Other (See Comments)    Stops heart per pt   . Propoxyphene Other (See Comments)    Slowed heart rate per pt (reaction to Darvocet)  . Ranitidine Other (See Comments)    Pt does not remember reaction  . Vicodin [Hydrocodone-Acetaminophen] Other (See Comments)    Seizures   . Tylenol [Acetaminophen] Rash    Home Medications:  (Not in a hospital admission)  OB/GYN Status:  No LMP recorded. Patient has had a hysterectomy.  General Assessment Data Location of Assessment: [facility] TTS Assessment: In system Is this a Tele or Face-to-Face Assessment?: Face-to-Face Is this an Initial Assessment or a Re-assessment for this encounter?: Initial Assessment Marital status: Widowed Is patient pregnant?: No Pregnancy Status: No Living Arrangements: Alone Can pt return to current living arrangement?: Yes Admission Status: Involuntary Is patient capable of signing voluntary admission?:  Yes Referral Source: Self/Family/Friend     Crisis Care Plan Living Arrangements: Alone Name of Psychiatrist: denies Name of Therapist: denies  Education Status Is patient currently in school?: No Is the patient employed, unemployed or receiving disability?: Unemployed  Risk to self with the past 6 months Suicidal Ideation: No Has patient been a risk to self within the past 6 months prior to admission? : No Suicidal Intent: No Has patient had any suicidal intent within the past 6 months prior to admission? : No Is patient at risk for suicide?: No Suicidal Plan?: No Has patient had any suicidal plan within the past 6 months prior to  admission? : No Access to Means: No Previous Attempts/Gestures: No Intentional Self Injurious Behavior: None Family Suicide History: No Recent stressful life event(s): Other (Comment) Persecutory voices/beliefs?: No Depression: No Substance abuse history and/or treatment for substance abuse?: No Suicide prevention information given to non-admitted patients: Not applicable  Risk to Others within the past 6 months Homicidal Ideation: No Does patient have any lifetime risk of violence toward others beyond the six months prior to admission? : No Thoughts of Harm to Others: No Current Homicidal Intent: No Current Homicidal Plan: No Access to Homicidal Means: No History of harm to others?: No Assessment of Violence: None Noted Does patient have access to weapons?: No Criminal Charges Pending?: No Does patient have a court date: No Is patient on probation?: No  Psychosis Hallucinations: None noted Delusions: Unspecified  Mental Status Report Appearance/Hygiene: Unremarkable Eye Contact: Good Motor Activity: Unremarkable Speech: Logical/coherent Level of Consciousness: Alert Mood: Pleasant, Euthymic Affect: Appropriate to circumstance Anxiety Level: None Thought Processes: Coherent, Relevant Judgement: Partial Orientation: Person,  Place, Time, Situation Obsessive Compulsive Thoughts/Behaviors: None  Cognitive Functioning Concentration: Normal Memory: Recent Intact, Remote Intact Is patient IDD: No Is patient DD?: No Insight: Fair Impulse Control: Good Appetite: Good Have you had any weight changes? : No Change Sleep: No Change Vegetative Symptoms: None  ADLScreening [facility] Assessment Services) Patient's cognitive ability adequate to safely complete daily activities?: Yes Patient able to express need for assistance with ADLs?: Yes Independently performs ADLs?: Yes (appropriate for developmental age)  Prior Inpatient Therapy Prior Inpatient Therapy: Yes Prior Therapy Dates: over 10 years ago Prior Therapy Facilty/Provider(s): facility in [place] Reason for Treatment: bipolar  Prior Outpatient Therapy Prior Outpatient Therapy: Yes Prior Therapy Dates: up until a couple of weeks ago Prior Therapy Facilty/Provider(s): Psychologist named Dr. Ronette Deter Reason for Treatment: bipolar Does patient have an ACCT team?: No Does patient have Intensive In-House Services?  : No Does patient have [facility] services? : No Does patient have P4CC services?: No  ADL Screening (condition at time of admission) Patient's cognitive ability adequate to safely complete daily activities?: Yes Is the patient deaf or have difficulty hearing?: No Does the patient have difficulty seeing, even when wearing glasses/contacts?: No Does the patient have difficulty concentrating, remembering, or making decisions?: No Patient able to express need for assistance with ADLs?: Yes Does the patient have difficulty dressing or bathing?: No Independently performs ADLs?: Yes (appropriate for developmental age) Does the patient have difficulty walking or climbing stairs?: No Weakness of Legs: None Weakness of Arms/Hands: None  Home Assistive Devices/Equipment Home Assistive Devices/Equipment: None    Abuse/Neglect Assessment (Assessment to be  complete while patient is alone) Abuse/Neglect Assessment Can Be Completed: Yes Physical Abuse: Denies Verbal Abuse: Denies Sexual Abuse: Denies Exploitation of patient/patient's resources: Denies Self-Neglect: Denies     Regulatory affairs officer (For Healthcare) Does Patient Have a Medical Advance Directive?: No Would patient like information on creating a medical advance directive?: No - Patient declined Nutrition Screen- [facility] Patient's home diet: Regular Has the patient recently lost weight without trying?: No Has the patient been eating poorly because of a decreased appetite?: No Malnutrition Screening Tool Score: 0        Disposition:  Disposition Initial Assessment Completed for this Encounter: Yes(consulted with Dr. Mariea Clonts)  On Site Evaluation by:   Reviewed with Physician:    Rexene Edison 05/12/2017 9:13 AM

## 2017-05-12 NOTE — ED Notes (Signed)
Pt refused EKG informed the nurse

## 2017-05-12 NOTE — ED Notes (Signed)
Daughter [name] called. She states that she is not able to pick up her mother and gave me her sister's number. Will called "[name] [phone].

## 2017-05-12 NOTE — BH Assessment (Signed)
[facility] Assessment Progress Note  Per Buford Dresser, DO, this pt does not require psychiatric hospitalization at this time.  Pt is psychiatrically cleared.  Pt presents under IVC initiated by pt's daughter, which Dr Mariea Clonts has rescinded.  No outpatient referrals are required.  Pt's nurse has been notified.  Jalene Mullet, [place] Triage Specialist [phone]

## 2017-05-12 NOTE — ED Notes (Signed)
Pt refusing EKG

## 2017-05-12 NOTE — ED Notes (Signed)
Pt refuses to believe that her daughter is her daughter. Pt states that her family is not actually her daughter.

## 2017-05-12 NOTE — ED Notes (Signed)
Pt refusing meds 

## 2017-05-12 NOTE — BHH Suicide Risk Assessment (Signed)
Suicide Risk Assessment  Discharge Assessment   [facility] Discharge Suicide Risk Assessment   Principal Problem: Bipolar 1 disorder [facility]) Discharge Diagnoses:  Patient Active Problem List   Diagnosis Date Noted  . Vertigo [R42]   . Hypotension [I95.9]   . Heart disease [I51.9]   . GERD (gastroesophageal reflux disease) [K21.9]   . Autoimmune hepatitis ([place]) [K75.4]   . Anxiety [F41.9]   . Low back pain [M54.5] 10/18/2016  . Gingival abscess [K05.219] 08/26/2016  . Acute right hip pain [M25.551] 08/26/2016  . Seizure disorder ([place]) [M54.650] 10/14/2014  . Drug-induced Parkinson's disease ([place]) [G21.19] 10/14/2014  . Hypothyroidism [E03.9] 09/09/2014  . Azathioprine-induced leukopenia ([place]) [D70.2, T45.1X5A] 08/25/2014  . Thrombocytopenia due to drugs [D69.59, T50.905A] 08/25/2014  . Hepatitis, autoimmune ([place]) [K75.4] 08/25/2014  . Syncope [R55]   . Seizures ([place]) [R56.9]   . Sinus arrest [I45.5]   . Pacemaker Medtronic [Z95.0]   . Bipolar 1 disorder ([place]) [F31.9]   . Glaucoma [H40.9] 01/25/2001    Total Time spent with patient: 45 minutes  Musculoskeletal: Strength & Muscle Tone: within normal limits Gait & Station: normal Patient leans: N/A  Psychiatric Specialty Exam:   Blood pressure 129/74, pulse 84, temperature 97.7 F (36.5 C), temperature source Oral, resp. rate 20, height 5\' 8"  (1.727 m), weight 91.2 kg (201 lb), SpO2 98 %.Body mass index is 30.56 kg/m.  General Appearance: Casual  Eye Contact::  Good  Speech:  Normal Rate409  Volume:  Normal  Mood:  Euthymic  Affect:  Congruent  Thought Process:  Coherent and Descriptions of Associations: Intact  Orientation:  Full (Time, Place, and Person)  Thought Content:  WDL and Logical  Suicidal Thoughts:  No  Homicidal Thoughts:  No  Memory:  Immediate;   Fair Recent;   Fair Remote;   Fair  Judgement:  Fair  Insight:  Fair  Psychomotor Activity:  Normal  Concentration:  Good  Recall:  Good  Fund of  Knowledge:Fair  Language: Good  Akathisia:  No  Handed:  Right  AIMS (if indicated):     Assets:  Leisure Time Physical Health Resilience  Sleep:     Cognition: WNL  ADL's:  Intact   Mental Status Per Nursing Assessment::   On Admission:   72 yo female who was sitting outside yesterday in her bare feet, which she reports she likes to do when it is warm like yesterday.  No suicidal/homicidal ideations, hallucinations, or withdrawal symptoms.  She does have a UTI and Rx provided, no threat to herself, is moving in with her daughter soon, stable for discharge.  Demographic Factors:  Age 79 or older, Caucasian and Living alone  Loss Factors: NA  Historical Factors: NA  Risk Reduction Factors:   Sense of responsibility to family and Positive social support  Continued Clinical Symptoms:  None  Cognitive Features That Contribute To Risk:  None    Suicide Risk:  Minimal: No identifiable suicidal ideation.  Patients presenting with no risk factors but with morbid ruminations; may be classified as minimal risk based on the severity of the depressive symptoms    Plan Of Care/Follow-up recommendations:  Activity:  as tolerated Diet:  heart healthy diet  Hetvi Shawhan, NP 05/12/2017, 2:22 PM

## 2017-05-12 NOTE — ED Notes (Signed)
Pt stated "I was sitting outside today enjoying all the beautiful sunshine.  One of the people from the apartment gave me a chair to sit in.  I sometimes sit outside for a long time.  I ate breakfast this morning but have not eaten since.  I just want to drink this water.  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

## 2017-05-12 NOTE — ED Notes (Signed)
Pt refused for writer to draw blood work, will not give up patient's belonging.  Pt still has her shoes on.

## 2017-05-16 ENCOUNTER — Encounter (HOSPITAL_COMMUNITY): Payer: Self-pay | Admitting: Emergency Medicine

## 2017-05-16 ENCOUNTER — Emergency Department (HOSPITAL_COMMUNITY)
Admission: EM | Admit: 2017-05-16 | Discharge: 2017-05-17 | Disposition: A | Discharge status: Other Acute Inpatient Hospital | Payer: Medicare Other | Attending: Emergency Medicine | Admitting: Emergency Medicine

## 2017-05-16 DIAGNOSIS — Z79899 Other long term (current) drug therapy: Secondary | ICD-10-CM | POA: Diagnosis not present

## 2017-05-16 DIAGNOSIS — F312 Bipolar disorder, current episode manic severe with psychotic features: Secondary | ICD-10-CM | POA: Diagnosis not present

## 2017-05-16 DIAGNOSIS — Z950 Presence of cardiac pacemaker: Secondary | ICD-10-CM | POA: Insufficient documentation

## 2017-05-16 DIAGNOSIS — F22 Delusional disorders: Secondary | ICD-10-CM | POA: Diagnosis not present

## 2017-05-16 DIAGNOSIS — E039 Hypothyroidism, unspecified: Secondary | ICD-10-CM | POA: Insufficient documentation

## 2017-05-16 DIAGNOSIS — E876 Hypokalemia: Secondary | ICD-10-CM | POA: Diagnosis not present

## 2017-05-16 DIAGNOSIS — Z87891 Personal history of nicotine dependence: Secondary | ICD-10-CM | POA: Insufficient documentation

## 2017-05-16 DIAGNOSIS — Z046 Encounter for general psychiatric examination, requested by authority: Secondary | ICD-10-CM | POA: Diagnosis not present

## 2017-05-16 LAB — COMPREHENSIVE METABOLIC PANEL
ALT: 42 U/L (ref 14–54)
AST: 48 U/L — ABNORMAL HIGH (ref 15–41)
Albumin: 3.6 g/dL (ref 3.5–5.0)
Alkaline Phosphatase: 68 U/L (ref 38–126)
Anion gap: 12 (ref 5–15)
BUN: 25 mg/dL — ABNORMAL HIGH (ref 6–20)
CO2: 19 mmol/L — ABNORMAL LOW (ref 22–32)
Calcium: 9.2 mg/dL (ref 8.9–10.3)
Chloride: 107 mmol/L (ref 101–111)
Creatinine, Ser: 1.15 mg/dL — ABNORMAL HIGH (ref 0.44–1.00)
GFR calc Af Amer: 54 mL/min — ABNORMAL LOW (ref >60)
GFR calc non Af Amer: 46 mL/min — ABNORMAL LOW (ref >60)
Glucose, Bld: 111 mg/dL — ABNORMAL HIGH (ref 65–99)
Potassium: 3 mmol/L — ABNORMAL LOW (ref 3.5–5.1)
Sodium: 138 mmol/L (ref 135–145)
Total Bilirubin: 1.7 mg/dL — ABNORMAL HIGH (ref 0.3–1.2)
Total Protein: 7.6 g/dL (ref 6.5–8.1)

## 2017-05-16 LAB — RAPID URINE DRUG SCREEN, HOSP PERFORMED
Amphetamines: NOT DETECTED
Barbiturates: NOT DETECTED
Benzodiazepines: NOT DETECTED
Cocaine: NOT DETECTED
Opiates: NOT DETECTED
Tetrahydrocannabinol: NOT DETECTED

## 2017-05-16 LAB — URINALYSIS, ROUTINE W REFLEX MICROSCOPIC
Glucose, UA: NEGATIVE mg/dL
Ketones, ur: 15 mg/dL — AB
Nitrite: NEGATIVE
Specific Gravity, Urine: >1.03 — ABNORMAL HIGH (ref 1.005–1.030)
pH: 6 (ref 5.0–8.0)

## 2017-05-16 LAB — ACETAMINOPHEN LEVEL: Acetaminophen (Tylenol), Serum: <10 ug/mL — ABNORMAL LOW (ref 10–30)

## 2017-05-16 LAB — CBC WITH DIFFERENTIAL/PLATELET
Basophils Absolute: 0 10*3/uL (ref 0.0–0.1)
Basophils Relative: 0 %
Eosinophils Absolute: 0 10*3/uL (ref 0.0–0.7)
Eosinophils Relative: 1 %
HCT: 43.3 % (ref 36.0–46.0)
Hemoglobin: 15.7 g/dL — ABNORMAL HIGH (ref 12.0–15.0)
Lymphocytes Relative: 21 %
Lymphs Abs: 0.9 10*3/uL (ref 0.7–4.0)
MCH: 33.7 pg (ref 26.0–34.0)
MCHC: 36.3 g/dL — ABNORMAL HIGH (ref 30.0–36.0)
MCV: 92.9 fL (ref 78.0–100.0)
Monocytes Absolute: 0.5 10*3/uL (ref 0.1–1.0)
Monocytes Relative: 12 %
Neutro Abs: 2.7 10*3/uL (ref 1.7–7.7)
Neutrophils Relative %: 66 %
Platelets: 132 10*3/uL — ABNORMAL LOW (ref 150–400)
RBC: 4.66 MIL/uL (ref 3.87–5.11)
RDW: 15.9 % — ABNORMAL HIGH (ref 11.5–15.5)
WBC: 4.1 10*3/uL (ref 4.0–10.5)

## 2017-05-16 LAB — URINALYSIS, MICROSCOPIC (REFLEX)

## 2017-05-16 LAB — ETHANOL: Alcohol, Ethyl (B): <10 mg/dL (ref <10)

## 2017-05-16 LAB — SALICYLATE LEVEL: Salicylate Lvl: <7 mg/dL (ref 2.8–30.0)

## 2017-05-16 MED ORDER — POTASSIUM CHLORIDE CRYS ER 20 MEQ PO TBCR
40.0000 meq | EXTENDED_RELEASE_TABLET | Freq: Once | ORAL | Status: AC
  Administered 2017-05-16: 40 meq via ORAL
  Filled 2017-05-16: qty 2

## 2017-05-16 NOTE — ED Notes (Signed)
Bed: W[facility]61 Expected date:  Expected time:  Means of arrival:  Comments: Hold for triage 4

## 2017-05-16 NOTE — ED Notes (Signed)
Bed: [facility] Expected date:  Expected time:  Means of arrival:  Comments: 

## 2017-05-16 NOTE — ED Triage Notes (Signed)
Pt brought in by [employer] under IVC papers taken out by daughter that state: "The respondent has been diagnosed with bipolar disorder w/ psychotic features. Respondent is prescribed Respirdal, anti seizure meds and liver meds. Respondent is not eating, sleeping or taking care of her personal hygiene. Respondent believes that people are in her apattment hiding drugs and installing spy equipment. Respondent is sitting in the lobby of apartment complex because she believes her apartment has ben blown up. Respondent is hearing voices telling her she is in danger and that she will be raped. Respondent believed that her daughter was an alien and that she was kidnapping her. Respondent is not taking any of her medications. Respondent is refusing all efforts of help.
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
## 2017-05-16 NOTE — ED Notes (Addendum)
Daughter  Kate Sable request to be called with update to mothers condition and if any questions occur about the circumstances surrounding this IVC admission. Cell phone [phone]

## 2017-05-17 ENCOUNTER — Emergency Department (HOSPITAL_COMMUNITY): Payer: Medicare Other

## 2017-05-17 DIAGNOSIS — K754 Autoimmune hepatitis: Secondary | ICD-10-CM | POA: Diagnosis not present

## 2017-05-17 DIAGNOSIS — E876 Hypokalemia: Secondary | ICD-10-CM | POA: Diagnosis not present

## 2017-05-17 DIAGNOSIS — Z950 Presence of cardiac pacemaker: Secondary | ICD-10-CM | POA: Diagnosis not present

## 2017-05-17 DIAGNOSIS — F22 Delusional disorders: Secondary | ICD-10-CM | POA: Diagnosis not present

## 2017-05-17 DIAGNOSIS — R443 Hallucinations, unspecified: Secondary | ICD-10-CM | POA: Diagnosis not present

## 2017-05-17 DIAGNOSIS — Z9114 Patient's other noncompliance with medication regimen: Secondary | ICD-10-CM | POA: Diagnosis not present

## 2017-05-17 DIAGNOSIS — R072 Precordial pain: Secondary | ICD-10-CM | POA: Diagnosis not present

## 2017-05-17 DIAGNOSIS — F312 Bipolar disorder, current episode manic severe with psychotic features: Secondary | ICD-10-CM | POA: Diagnosis not present

## 2017-05-17 DIAGNOSIS — Z79899 Other long term (current) drug therapy: Secondary | ICD-10-CM | POA: Diagnosis not present

## 2017-05-17 DIAGNOSIS — Z7982 Long term (current) use of aspirin: Secondary | ICD-10-CM | POA: Diagnosis not present

## 2017-05-17 DIAGNOSIS — Z87891 Personal history of nicotine dependence: Secondary | ICD-10-CM | POA: Diagnosis not present

## 2017-05-17 DIAGNOSIS — E079 Disorder of thyroid, unspecified: Secondary | ICD-10-CM | POA: Diagnosis not present

## 2017-05-17 DIAGNOSIS — Z888 Allergy status to other drugs, medicaments and biological substances status: Secondary | ICD-10-CM | POA: Diagnosis not present

## 2017-05-17 DIAGNOSIS — Z886 Allergy status to analgesic agent status: Secondary | ICD-10-CM | POA: Diagnosis not present

## 2017-05-17 DIAGNOSIS — G40909 Epilepsy, unspecified, not intractable, without status epilepticus: Secondary | ICD-10-CM | POA: Diagnosis present

## 2017-05-17 DIAGNOSIS — Z046 Encounter for general psychiatric examination, requested by authority: Secondary | ICD-10-CM | POA: Diagnosis not present

## 2017-05-17 DIAGNOSIS — F39 Unspecified mood [affective] disorder: Secondary | ICD-10-CM | POA: Diagnosis not present

## 2017-05-17 DIAGNOSIS — F200 Paranoid schizophrenia: Secondary | ICD-10-CM | POA: Diagnosis not present

## 2017-05-17 DIAGNOSIS — E039 Hypothyroidism, unspecified: Secondary | ICD-10-CM | POA: Diagnosis not present

## 2017-05-17 DIAGNOSIS — R7989 Other specified abnormal findings of blood chemistry: Secondary | ICD-10-CM | POA: Diagnosis not present

## 2017-05-17 DIAGNOSIS — Z781 Physical restraint status: Secondary | ICD-10-CM | POA: Diagnosis not present

## 2017-05-17 DIAGNOSIS — M25552 Pain in left hip: Secondary | ICD-10-CM | POA: Diagnosis not present

## 2017-05-17 DIAGNOSIS — Z880 Allergy status to penicillin: Secondary | ICD-10-CM | POA: Diagnosis not present

## 2017-05-17 IMAGING — DX DG CHEST 1V PORT
2 series · 2 of 2 positions shown · non-contrast
Comparison: Radiographs [DATE].

CLINICAL DATA: Evaluation for psychiatric placement.

EXAM:
PORTABLE CHEST 1 VIEW

[chest ap (1 of 2)]
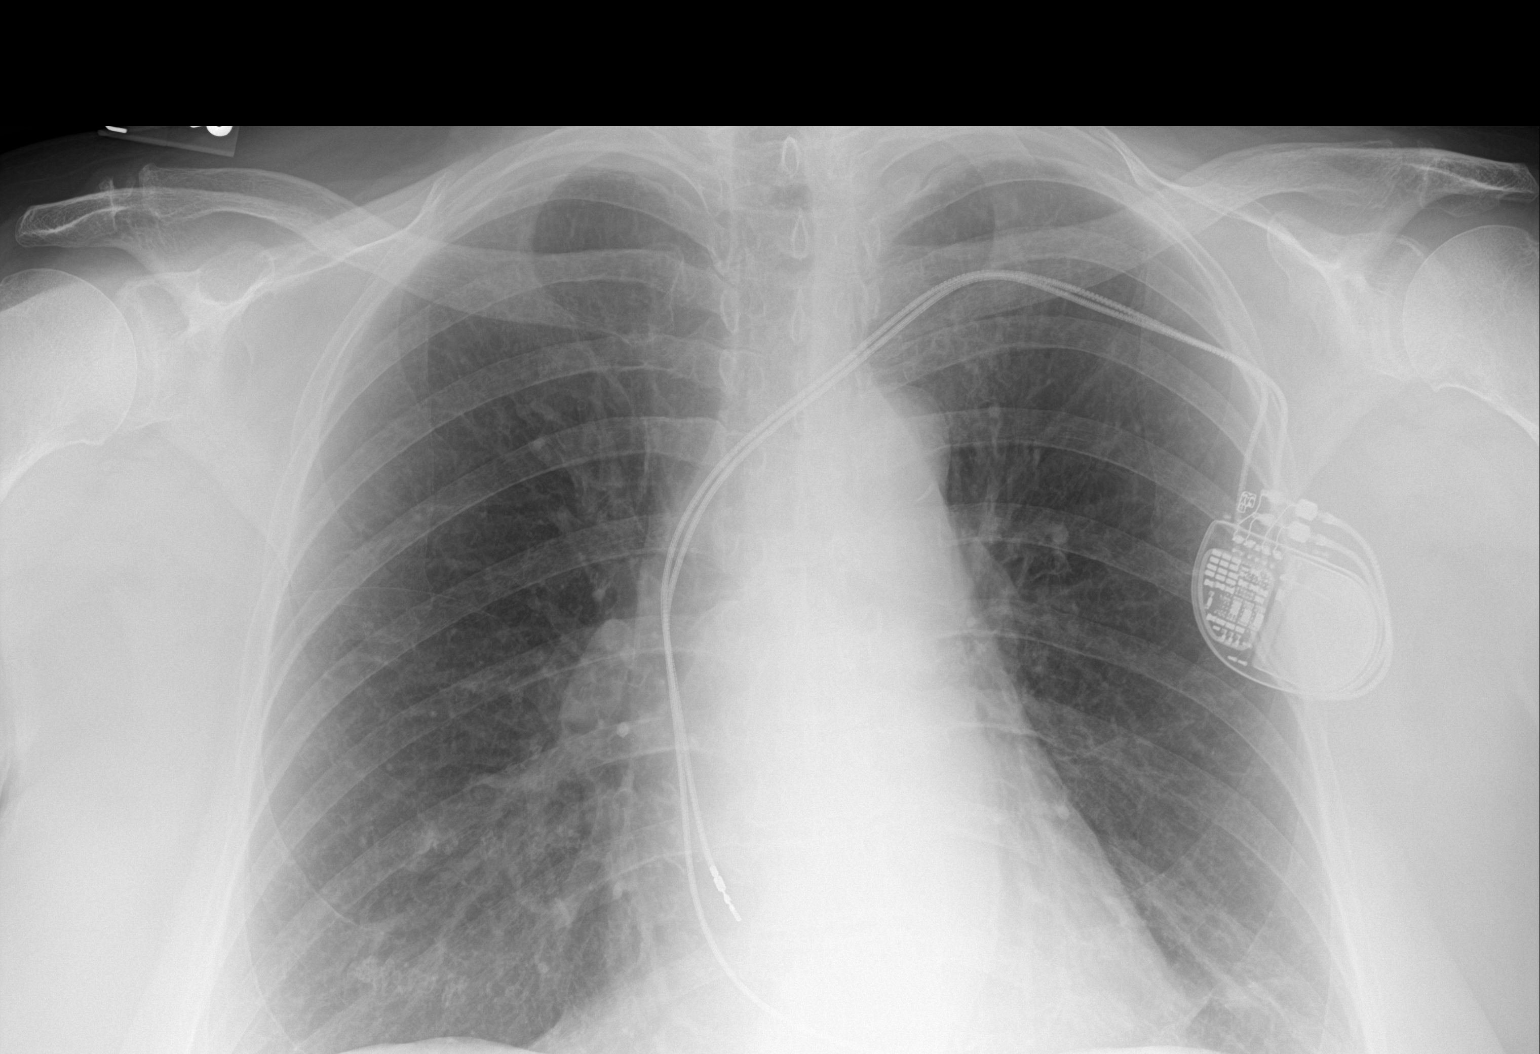

[chest ap (2 of 2)]
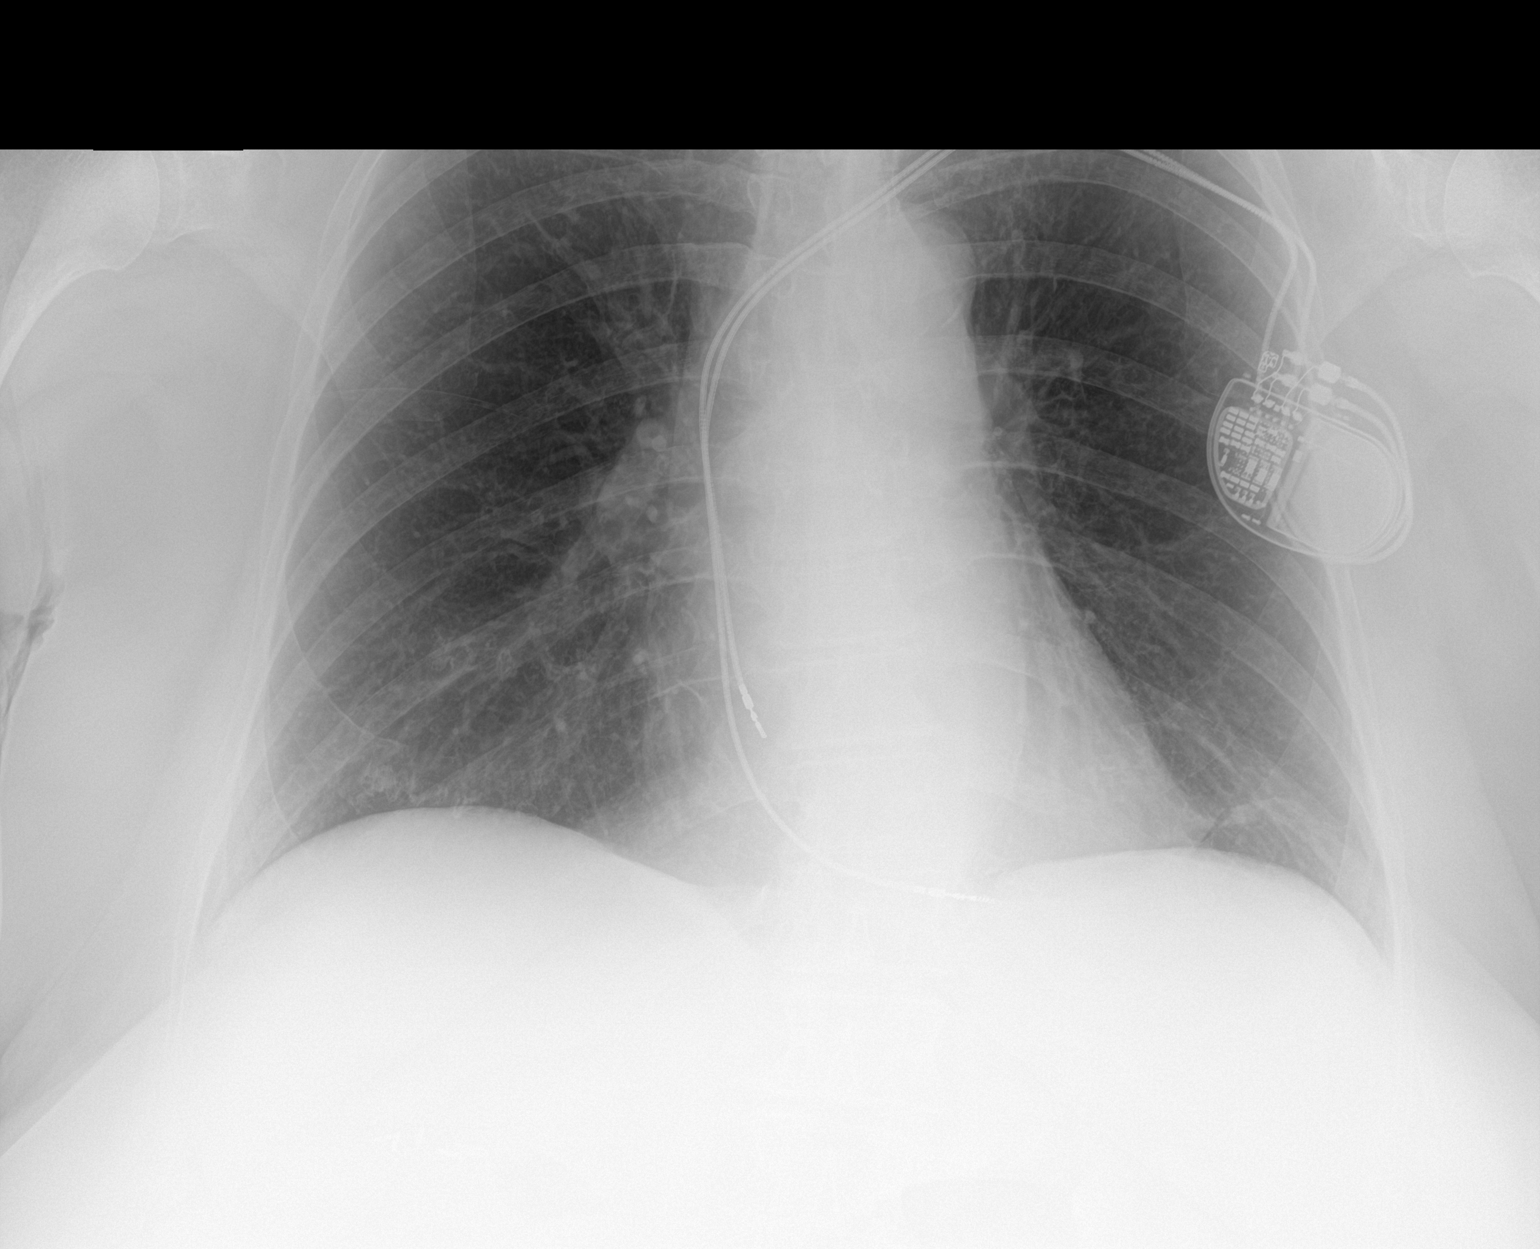

[2 of 2 positions shown; findings below may reference images not displayed]

FINDINGS: The heart size and mediastinal contours are within normal limits.
Both lungs are clear. Left-sided pacemaker is unchanged in position.
The visualized skeletal structures are unremarkable.
IMPRESSION: No acute cardiopulmonary abnormality seen.

## 2017-05-17 MED ORDER — RISPERIDONE 2 MG PO TABS: 3.0000 mg | ORAL_TABLET | Freq: Every day | ORAL | Status: DC

## 2017-05-17 NOTE — BH Assessment (Signed)
[facility] Assessment Progress Note  This Probation officer spoke to pt and informed her that she is to be transferred to [facility] under IVC later today.  With the wishes of Kate Sable, pt's daughter, in mind, I asked pt to sign Consent to Release Information to both this daughter, and to Aris [place], pt's other daughter.  Pt refused.  I have informed pt's nurse, Randi, of this.  I have placed no calls to either daughter.  Jalene Mullet, [place] Coordinator [phone]

## 2017-05-17 NOTE — BH Assessment (Signed)
[facility] Assessment Progress Note  Per Buford Dresser, DO, this pt requires psychiatric hospitalization at this time.  Pt presents under IVC initiated by pt's daughter, which Dr Mariea Clonts has upheld.  Pt has been accepted to [facility], and Ethelene Hal, FNP, concurs with this decision.  Please see note authored by Kingsley Spittle, LCSW for further details.  Pt's nurse, Lala Lund, has been notified, and agrees to call report to [phone].  Pt is to be transported via [facility].  [place] Coordinator [phone]
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
## 2017-05-17 NOTE — Progress Notes (Signed)
CSW received call from [facility] with [facility] accepting patient for today 4/23. Patient is able to transfer to facility anytime before 11PM- if unable to get to facility by 11PM please have her arrive after 6AM on 4/24.  Accepting physician/NP: Abner Greenspan NP supervised by Dr. Anders Simmonds  Report #: [phone] Room #: 086 bed 2 ( [place] [facility])  Kingsley Spittle, [facility] Emergency Room Clinical Social Worker [phone]

## 2017-05-17 NOTE — ED Notes (Signed)
Report called to [facility]. Sheriff here to transport patient

## 2017-05-18 LAB — URINE CULTURE

## 2017-05-25 ENCOUNTER — Other Ambulatory Visit: Payer: Medicare Other

## 2017-05-25 ENCOUNTER — Ambulatory Visit: Payer: Medicare Other

## 2017-05-26 ENCOUNTER — Ambulatory Visit: Payer: Medicare Other

## 2017-05-26 ENCOUNTER — Inpatient Hospital Stay: Admission: RE | Admit: 2017-05-26 | Payer: Medicare Other | Source: Ambulatory Visit

## 2017-06-13 ENCOUNTER — Encounter: Payer: Medicare Other | Admitting: *Deleted

## 2017-06-13 ENCOUNTER — Telehealth: Payer: Self-pay | Admitting: Cardiology

## 2017-06-13 NOTE — Telephone Encounter (Signed)
LMOVM reminding pt to send remote transmission.   

## 2017-06-16 ENCOUNTER — Encounter: Payer: Self-pay | Admitting: Cardiology

## 2017-07-04 ENCOUNTER — Ambulatory Visit: Payer: Self-pay | Admitting: Diagnostic Neuroimaging

## 2017-07-04 ENCOUNTER — Encounter

## 2017-07-08 ENCOUNTER — Other Ambulatory Visit: Payer: Self-pay | Admitting: Diagnostic Neuroimaging

## 2017-07-08 DIAGNOSIS — R569 Unspecified convulsions: Secondary | ICD-10-CM

## 2017-07-11 ENCOUNTER — Telehealth: Payer: Self-pay | Admitting: Family Medicine

## 2017-07-11 NOTE — Telephone Encounter (Signed)
Note to pharmacy: patient last seen 12/2015. Must schedule FU.

## 2017-07-11 NOTE — Telephone Encounter (Signed)
Patient called requesting  RX for  Levothyroxine  And she wants to know if she need to comeback  For Lab Work  . Please, call her  Thank You  .

## 2017-07-12 ENCOUNTER — Other Ambulatory Visit: Payer: Self-pay

## 2017-07-12 ENCOUNTER — Encounter: Payer: Self-pay | Admitting: Cardiology

## 2017-07-12 MED ORDER — LEVOTHYROXINE SODIUM 100 MCG PO TABS: 100.0000 ug | ORAL_TABLET | Freq: Every day | ORAL | 3 refills | Status: DC

## 2017-07-12 NOTE — Telephone Encounter (Signed)
Sent to [facility]

## 2017-11-15 ENCOUNTER — Encounter: Payer: Self-pay | Admitting: Cardiology

## 2017-11-28 ENCOUNTER — Ambulatory Visit (INDEPENDENT_AMBULATORY_CARE_PROVIDER_SITE_OTHER): Payer: Medicare Other | Admitting: *Deleted

## 2017-11-28 DIAGNOSIS — I495 Sick sinus syndrome: Secondary | ICD-10-CM

## 2017-11-30 NOTE — Progress Notes (Signed)
Remote pacemaker transmission.   

## 2017-12-01 ENCOUNTER — Encounter: Payer: Self-pay | Admitting: Cardiology

## 2018-01-27 LAB — CUP PACEART REMOTE DEVICE CHECK
Battery Remaining Longevity: 62 mo
Battery Voltage: 3 V
Brady Statistic AP VP Percent: 0.03 %
Brady Statistic AP VS Percent: 23.66 %
Brady Statistic AS VP Percent: 0.16 %
Brady Statistic AS VS Percent: 76.15 %
Brady Statistic RA Percent Paced: 23.65 %
Brady Statistic RV Percent Paced: 0.2 %
Date Time Interrogation Session: 20191104213353
Implantable Lead Implant Date: 20141205
Implantable Lead Implant Date: 20141205
Implantable Lead Location: 753859
Implantable Lead Location: 753860
Implantable Lead Model: 5076
Implantable Lead Model: 5076
Implantable Pulse Generator Implant Date: 20141205
Lead Channel Impedance Value: 342 Ohm
Lead Channel Impedance Value: 399 Ohm
Lead Channel Impedance Value: 475 Ohm
Lead Channel Impedance Value: 494 Ohm
Lead Channel Pacing Threshold Amplitude: 0.375 V
Lead Channel Pacing Threshold Amplitude: 0.875 V
Lead Channel Pacing Threshold Pulse Width: 0.4 ms
Lead Channel Pacing Threshold Pulse Width: 0.4 ms
Lead Channel Sensing Intrinsic Amplitude: 2.25 mV
Lead Channel Sensing Intrinsic Amplitude: 2.25 mV
Lead Channel Sensing Intrinsic Amplitude: 4.375 mV
Lead Channel Sensing Intrinsic Amplitude: 4.375 mV
Lead Channel Setting Pacing Amplitude: 2 V
Lead Channel Setting Pacing Amplitude: 2.5 V
Lead Channel Setting Pacing Pulse Width: 0.4 ms
Lead Channel Setting Sensing Sensitivity: 0.6 mV

## 2018-03-24 ENCOUNTER — Encounter: Payer: Self-pay | Admitting: Cardiology

## 2018-05-02 ENCOUNTER — Other Ambulatory Visit: Payer: Self-pay

## 2018-05-02 ENCOUNTER — Ambulatory Visit (INDEPENDENT_AMBULATORY_CARE_PROVIDER_SITE_OTHER): Payer: Medicare Other | Admitting: *Deleted

## 2018-05-02 DIAGNOSIS — I495 Sick sinus syndrome: Secondary | ICD-10-CM | POA: Diagnosis not present

## 2018-05-02 LAB — CUP PACEART REMOTE DEVICE CHECK
Battery Remaining Longevity: 60 mo
Battery Voltage: 2.99 V
Brady Statistic AP VP Percent: 0.01 %
Brady Statistic AP VS Percent: 7.12 %
Brady Statistic AS VP Percent: 0.09 %
Brady Statistic AS VS Percent: 92.79 %
Brady Statistic RA Percent Paced: 7.13 %
Brady Statistic RV Percent Paced: 0.1 %
Date Time Interrogation Session: 20200407161207
Implantable Lead Implant Date: 20141205
Implantable Lead Implant Date: 20141205
Implantable Lead Location: 753859
Implantable Lead Location: 753860
Implantable Lead Model: 5076
Implantable Lead Model: 5076
Implantable Pulse Generator Implant Date: 20141205
Lead Channel Impedance Value: 304 Ohm
Lead Channel Impedance Value: 380 Ohm
Lead Channel Impedance Value: 380 Ohm
Lead Channel Impedance Value: 418 Ohm
Lead Channel Pacing Threshold Amplitude: 0.375 V
Lead Channel Pacing Threshold Amplitude: 0.875 V
Lead Channel Pacing Threshold Pulse Width: 0.4 ms
Lead Channel Pacing Threshold Pulse Width: 0.4 ms
Lead Channel Sensing Intrinsic Amplitude: 2.25 mV
Lead Channel Sensing Intrinsic Amplitude: 2.25 mV
Lead Channel Sensing Intrinsic Amplitude: 3.125 mV
Lead Channel Sensing Intrinsic Amplitude: 3.125 mV
Lead Channel Setting Pacing Amplitude: 2 V
Lead Channel Setting Pacing Amplitude: 2.5 V
Lead Channel Setting Pacing Pulse Width: 0.4 ms
Lead Channel Setting Sensing Sensitivity: 0.6 mV

## 2018-05-11 ENCOUNTER — Encounter: Payer: Self-pay | Admitting: Cardiology

## 2018-05-11 NOTE — Progress Notes (Signed)
Remote pacemaker transmission.   

## 2018-08-01 ENCOUNTER — Ambulatory Visit (INDEPENDENT_AMBULATORY_CARE_PROVIDER_SITE_OTHER): Payer: Medicare Other | Admitting: *Deleted

## 2018-08-01 DIAGNOSIS — I495 Sick sinus syndrome: Secondary | ICD-10-CM | POA: Diagnosis not present

## 2018-08-02 ENCOUNTER — Telehealth: Payer: Self-pay

## 2018-08-02 LAB — CUP PACEART REMOTE DEVICE CHECK
Battery Remaining Longevity: 56 mo
Battery Voltage: 2.99 V
Brady Statistic AP VP Percent: 0.04 %
Brady Statistic AP VS Percent: 11.99 %
Brady Statistic AS VP Percent: 0.3 %
Brady Statistic AS VS Percent: 87.67 %
Brady Statistic RA Percent Paced: 12.02 %
Brady Statistic RV Percent Paced: 0.35 %
Date Time Interrogation Session: 20200708170809
Implantable Lead Implant Date: 20141205
Implantable Lead Implant Date: 20141205
Implantable Lead Location: 753859
Implantable Lead Location: 753860
Implantable Lead Model: 5076
Implantable Lead Model: 5076
Implantable Pulse Generator Implant Date: 20141205
Lead Channel Impedance Value: 323 Ohm
Lead Channel Impedance Value: 380 Ohm
Lead Channel Impedance Value: 456 Ohm
Lead Channel Impedance Value: 475 Ohm
Lead Channel Pacing Threshold Amplitude: 0.5 V
Lead Channel Pacing Threshold Amplitude: 1 V
Lead Channel Pacing Threshold Pulse Width: 0.4 ms
Lead Channel Pacing Threshold Pulse Width: 0.4 ms
Lead Channel Sensing Intrinsic Amplitude: 2 mV
Lead Channel Sensing Intrinsic Amplitude: 2 mV
Lead Channel Sensing Intrinsic Amplitude: 3.625 mV
Lead Channel Sensing Intrinsic Amplitude: 3.625 mV
Lead Channel Setting Pacing Amplitude: 2 V
Lead Channel Setting Pacing Amplitude: 2.5 V
Lead Channel Setting Pacing Pulse Width: 0.4 ms
Lead Channel Setting Sensing Sensitivity: 0.6 mV

## 2018-08-02 NOTE — Telephone Encounter (Signed)
Spoke with patient to remind of missed remote transmission 

## 2018-08-12 ENCOUNTER — Encounter: Payer: Self-pay | Admitting: Cardiology

## 2018-08-12 NOTE — Progress Notes (Signed)
Remote pacemaker transmission.   

## 2018-10-31 ENCOUNTER — Ambulatory Visit (INDEPENDENT_AMBULATORY_CARE_PROVIDER_SITE_OTHER): Payer: Medicare Other | Admitting: *Deleted

## 2018-10-31 DIAGNOSIS — I495 Sick sinus syndrome: Secondary | ICD-10-CM

## 2018-10-31 DIAGNOSIS — R55 Syncope and collapse: Secondary | ICD-10-CM | POA: Diagnosis not present

## 2018-11-01 LAB — CUP PACEART REMOTE DEVICE CHECK
Battery Remaining Longevity: 54 mo
Battery Voltage: 2.99 V
Brady Statistic AP VP Percent: 0.02 %
Brady Statistic AP VS Percent: 8.47 %
Brady Statistic AS VP Percent: 0.24 %
Brady Statistic AS VS Percent: 91.27 %
Brady Statistic RA Percent Paced: 8.48 %
Brady Statistic RV Percent Paced: 0.28 %
Date Time Interrogation Session: 20201006145503
Implantable Lead Implant Date: 20141205
Implantable Lead Implant Date: 20141205
Implantable Lead Location: 753859
Implantable Lead Location: 753860
Implantable Lead Model: 5076
Implantable Lead Model: 5076
Implantable Pulse Generator Implant Date: 20141205
Lead Channel Impedance Value: 323 Ohm
Lead Channel Impedance Value: 399 Ohm
Lead Channel Impedance Value: 418 Ohm
Lead Channel Impedance Value: 437 Ohm
Lead Channel Pacing Threshold Amplitude: 0.375 V
Lead Channel Pacing Threshold Amplitude: 1 V
Lead Channel Pacing Threshold Pulse Width: 0.4 ms
Lead Channel Pacing Threshold Pulse Width: 0.4 ms
Lead Channel Sensing Intrinsic Amplitude: 2.375 mV
Lead Channel Sensing Intrinsic Amplitude: 2.375 mV
Lead Channel Sensing Intrinsic Amplitude: 2.75 mV
Lead Channel Sensing Intrinsic Amplitude: 2.75 mV
Lead Channel Setting Pacing Amplitude: 2 V
Lead Channel Setting Pacing Amplitude: 2.5 V
Lead Channel Setting Pacing Pulse Width: 0.4 ms
Lead Channel Setting Sensing Sensitivity: 0.6 mV

## 2018-11-09 NOTE — Progress Notes (Signed)
Remote pacemaker transmission.   

## 2018-11-28 DIAGNOSIS — Z23 Encounter for immunization: Secondary | ICD-10-CM | POA: Diagnosis not present

## 2018-12-26 DIAGNOSIS — Z79899 Other long term (current) drug therapy: Secondary | ICD-10-CM | POA: Diagnosis not present

## 2018-12-26 DIAGNOSIS — Z1159 Encounter for screening for other viral diseases: Secondary | ICD-10-CM | POA: Diagnosis not present

## 2018-12-26 DIAGNOSIS — M130 Polyarthritis, unspecified: Secondary | ICD-10-CM | POA: Diagnosis not present

## 2018-12-26 DIAGNOSIS — Z008 Encounter for other general examination: Secondary | ICD-10-CM | POA: Diagnosis not present

## 2018-12-26 DIAGNOSIS — Z0000 Encounter for general adult medical examination without abnormal findings: Secondary | ICD-10-CM | POA: Diagnosis not present

## 2019-01-22 DIAGNOSIS — D709 Neutropenia, unspecified: Secondary | ICD-10-CM | POA: Diagnosis not present

## 2019-01-22 DIAGNOSIS — G2119 Other drug induced secondary parkinsonism: Secondary | ICD-10-CM | POA: Diagnosis not present

## 2019-01-22 DIAGNOSIS — Z008 Encounter for other general examination: Secondary | ICD-10-CM | POA: Diagnosis not present

## 2019-01-22 DIAGNOSIS — M130 Polyarthritis, unspecified: Secondary | ICD-10-CM | POA: Diagnosis not present

## 2019-01-22 DIAGNOSIS — Z0000 Encounter for general adult medical examination without abnormal findings: Secondary | ICD-10-CM | POA: Diagnosis not present

## 2019-01-22 DIAGNOSIS — G40909 Epilepsy, unspecified, not intractable, without status epilepticus: Secondary | ICD-10-CM | POA: Diagnosis not present

## 2019-01-22 DIAGNOSIS — B351 Tinea unguium: Secondary | ICD-10-CM | POA: Diagnosis not present

## 2019-01-22 DIAGNOSIS — F200 Paranoid schizophrenia: Secondary | ICD-10-CM | POA: Diagnosis not present

## 2019-01-22 DIAGNOSIS — E039 Hypothyroidism, unspecified: Secondary | ICD-10-CM | POA: Diagnosis not present

## 2019-01-22 DIAGNOSIS — Z0001 Encounter for general adult medical examination with abnormal findings: Secondary | ICD-10-CM | POA: Diagnosis not present

## 2019-01-22 DIAGNOSIS — D696 Thrombocytopenia, unspecified: Secondary | ICD-10-CM | POA: Diagnosis not present

## 2019-01-22 DIAGNOSIS — K625 Hemorrhage of anus and rectum: Secondary | ICD-10-CM | POA: Diagnosis not present

## 2019-01-30 ENCOUNTER — Ambulatory Visit (INDEPENDENT_AMBULATORY_CARE_PROVIDER_SITE_OTHER): Payer: Medicare Other | Admitting: *Deleted

## 2019-01-30 DIAGNOSIS — R55 Syncope and collapse: Secondary | ICD-10-CM | POA: Diagnosis not present

## 2019-01-30 LAB — CUP PACEART REMOTE DEVICE CHECK
Battery Remaining Longevity: 52 mo
Battery Voltage: 2.98 V
Brady Statistic AP VP Percent: 0.02 %
Brady Statistic AP VS Percent: 4.11 %
Brady Statistic AS VP Percent: 0.32 %
Brady Statistic AS VS Percent: 95.55 %
Brady Statistic RA Percent Paced: 4.13 %
Brady Statistic RV Percent Paced: 0.35 %
Date Time Interrogation Session: 20210105111713
Implantable Lead Implant Date: 20141205
Implantable Lead Implant Date: 20141205
Implantable Lead Location: 753859
Implantable Lead Location: 753860
Implantable Lead Model: 5076
Implantable Lead Model: 5076
Implantable Pulse Generator Implant Date: 20141205
Lead Channel Impedance Value: 323 Ohm
Lead Channel Impedance Value: 399 Ohm
Lead Channel Impedance Value: 418 Ohm
Lead Channel Impedance Value: 437 Ohm
Lead Channel Pacing Threshold Amplitude: 0.5 V
Lead Channel Pacing Threshold Amplitude: 1 V
Lead Channel Pacing Threshold Pulse Width: 0.4 ms
Lead Channel Pacing Threshold Pulse Width: 0.4 ms
Lead Channel Sensing Intrinsic Amplitude: 2.25 mV
Lead Channel Sensing Intrinsic Amplitude: 2.25 mV
Lead Channel Sensing Intrinsic Amplitude: 2.875 mV
Lead Channel Sensing Intrinsic Amplitude: 2.875 mV
Lead Channel Setting Pacing Amplitude: 2 V
Lead Channel Setting Pacing Amplitude: 2.5 V
Lead Channel Setting Pacing Pulse Width: 0.4 ms
Lead Channel Setting Sensing Sensitivity: 0.6 mV

## 2019-02-06 ENCOUNTER — Ambulatory Visit: Payer: Medicare Other | Admitting: Sports Medicine

## 2019-02-20 DIAGNOSIS — L858 Other specified epidermal thickening: Secondary | ICD-10-CM | POA: Diagnosis not present

## 2019-02-20 DIAGNOSIS — Z008 Encounter for other general examination: Secondary | ICD-10-CM | POA: Diagnosis not present

## 2019-02-20 DIAGNOSIS — G40909 Epilepsy, unspecified, not intractable, without status epilepticus: Secondary | ICD-10-CM | POA: Diagnosis not present

## 2019-02-20 DIAGNOSIS — R195 Other fecal abnormalities: Secondary | ICD-10-CM | POA: Diagnosis not present

## 2019-02-20 DIAGNOSIS — F200 Paranoid schizophrenia: Secondary | ICD-10-CM | POA: Diagnosis not present

## 2019-02-20 DIAGNOSIS — K625 Hemorrhage of anus and rectum: Secondary | ICD-10-CM | POA: Diagnosis not present

## 2019-02-20 DIAGNOSIS — B351 Tinea unguium: Secondary | ICD-10-CM | POA: Diagnosis not present

## 2019-02-25 NOTE — Progress Notes (Signed)
Referring Provider: Charlott Rakes, MD Primary Care Physician:  Charlott Rakes, MD  Reason for Consultation:  Rectal bleeding, change in stool caliber   IMPRESSION:  Rectal bleeding with change in stool caliber x 1 year History of advanced colon polyps    -Multiple colonoscopies over the years with both tubular adenomas and tubulovillous adenomas    -Colonoscopy 2018 at [facility]: 82m polyp in the ascending and at the hepatic flexure    -Surveillance colonoscopy recommended in 1 years Autoimmune hepatitis previously on azathioprine    -Diagnosed in 2006    -Liver biopsy 2006: Grade 3, stage I autoimmune hepatitis    -Previously on azathioprine and steroids    -[facility]    -Most recently treated by Dr. HBenson Norway last in 2018 Esophageal varices on EGD 02/10/16 (Benson Norway Suspected cirrhosis Uses a walker at home, wheelchair when out of her apartment Transportation issues  Rectal bleeding with change in stool caliber: The differential for rectal bleeding is broad.  She has a history of large, advanced colon polyps and symptoms worrisome for rectal mass.  Her bleeding may be due to recurrent polyps or mass however outlet sources such as fissure or hemorrhoids should be considered.  Rectal varices are also a possibility.  Given this differential I am recommending a colonoscopy.   Autoimmune hepatitis: Off treatment for 2-3 years. This may have increased her risk for disease progression.   Labs today to screen for activity. May need to resume azathioprine.    Suspected cirrhosis: History is worrisome for cirrhosis given her records from Dr. HBenson Norwaymentioning esophageal varices.  Labs today to screen for portal hypertension and to calculate a MELD score. Understanding the extent of possible coagulopathy or thrombocytopenia will be important to better understand her risks with endoscopy and anesthesia.  Screening for hepatocellular carcinoma recommended given her history  of cirrhosis and esophageal varices. Will start with ultrasound, however, given her body habitus, would proceed with cross-sectional screening in 6 months.   Esophageal varices:  EGD recommended given her history of varices.  Last EGD was 3 years ago.  We will obtain the results from Dr. HBenson Norwayfor review.  History of colon polyps: 2 large tubulovillous adenomas were removed on a colonoscopy at [facility]in [facility].  Surveillance recommended in 1 year.  This has not yet been performed.  We will proceed with the exam now.  As she uses a walker, endoscopy will need to be scheduled at the hospital. We reached out to the hospital this afternoon to schedule endoscopy. No obvious time available. Will continue to search for options so that her evaluation is not delayed.   PLAN: High fiber diet recommended, drink at least 1.5-2 liters of waterAdd a daily stool bulking agent such as Metamucil CMP, CBC, PT/INR, ANA, Smooth muscle antibody, IgG, PT/INR, AFP Abdominal ultrasound given the suspected history of cirrhosis Obtain prior records from Dr. HBenson Norwayincluding EGD report Colonoscopy and EGD at the hospital Follow-up in this office in one month, earlier as needed  The nature of the procedure, as well as the risks, benefits, and alternatives were carefully and thoroughly reviewed with the patient. Ample time for discussion and questions allowed. The patient understood, was satisfied, and agreed to proceed.  Please see the "Patient Instructions" section for addition details about the plan.  ADDENDUM: Labs from today showed hypokalemia, evidence for cirrhosis with hypoalbuminemia, mild coagulopathy, and thrombocytopenia. Will replace potassium and recheck a BMP next week. Although platelets are  low, they are high enough to proceed with endoscopic evaluation without platelet transfusion.   I spent 65 minutes of time, including in depth chart review, independent review of results as outlined above, communicating results  with the patient directly, face-to-face time with the patient, coordinating care, ordering studies and medications as appropriate, and documentation.    HPI: Stacey Velazquez is a 74 y.o. female referred by Dr. Arline Asp for further evaluation of rectal bleeding and a change in the caliber of her stool.  The history is obtained through the patient, review of her referral records from Dr. Arline Asp, review of EPIC, and review of years of records in [place] going back to 2006.  She has a seizure disorder, fibromyalgia, sick sinus syndrome requiring a pacemaker 2014, autoimmune hepatitis, colon polyps, aortic atherosclerosis, and paranoid schizophrenia.  She has previously been followed by the hepatology clinic at [facility] (last seen in 2014), the GI clinic at [facility] (last seen 2018), and by Dr. Carol Ada (last seen 12/13/16).    She has rectal bleeding x 1 years.  Feels like there is a blockage in the rectum preventing all of her stool to pass. Notes some liquids in addition to the solid stools. Has also noticed that her stools have become thinner. Tries to drinks a lot of water during the day.   She reports getting a colonoscopy every year from 1992 2014 where polyps were always removed.  She moved to [facility] in 2014 and did not have a colonoscopy for several years.  A colonoscopy with Dr. Benson Norway 02/10/2016 showed 6 polyps.  4 polyps ranged from 3 to 6 mm were removed and the descending: And transverse colon.  There were 2 polyps ranging from 30 to 40 mm that were biopsied and tattooed in the ascending colon and transverse.  Pathology report showed tubulous villous adenoma of high-grade dysplasia.  The patient was referred to [facility].  Records from [facility] notes that she was complaining of constant constipation, bloating, and abdominal pain.  There was an unintentional weight gain of 50 pounds over the preceding 6 months.  Colonoscopy with Dr. Stephanie Acre at [facility] 05/26/16:  "The perianal and digital rectal examinations were normal.     A greater than 50 mm polyp was found in the proximal ascending colon.     The polyp was sessile. Preparations were made for mucosal resection.     Saline with methylene blue was injected to raise the lesion. Snare     mucosal resection was performed. Resection and retrieval were complete.     Coagulation for tissue destruction using snare was successful.    A 50 mm polyp was found in the hepatic flexure. The polyp was     semi-pedunculated. Preparations were made for mucosal resection. Saline     with methylene blue was injected to raise the lesion. Snare mucosal     resection was performed. Resection and retrieval were complete.     Coagulation for tissue destruction using snare was successful.    The exam was otherwise without abnormality on direct and retroflexion     views." Pathology revealed Tubulovillous adenoma (multiple fragments) with multiple foci of high-grade dysplasia.  Surveillance colonoscopy recommended in 1 year.  She has a longstanding history of autoimmune hepatitis and was previously followed by the [facility] hepatology clinic.  Last visit 2014.  She was on azathioprine 100 mg daily and tapering off her steroids at that time.  A liver biopsy 2006 of grade 3 stage I however I am  unable to identify a date of the biopsy. More recently followed by Dr. Benson Norway for the autoimmune hepatitis. She has been off of medications since that time. She brings a copy of a letter from Dr. Benson Norway dated 02/14/19 that showed that she needed to have another EGD due to "evidence of cirrhosis" and "varices" seen on her EGD.  However, she was unaware of the diagnosis of cirrhosis.  There is no history of jaundice, ascites, hepatic encephalopathy, hepatorenal syndrome, or hepatopulmonary syndrome.  The most recent labs available to me are from 05/16/2017 showing a total bilirubin of 1.7, AST 48, ALT 42, alk phos 68, albumin 3.6, sodium 138, creatinine 1.15, white count 4.1, hemoglobin 15.7,  platelets 132.  An abdominal ultrasound from 02/19/2015 showed changes of cirrhosis with a subtle nodularity to the liver contours.  No focal abnormality.  Prior cholecystectomy.  Daughter was just diagnosed autoimmune hepatitis. Mother died from breast cancer. Maternal grandmother with colon cancer in her 79s. Maternal great-grandmother died of cervical cancer at age 31. Sister with ovarian cancer when "young." No other known family history of colon cancer or polyps. No family history of uterine/endometrial cancer, pancreatic cancer or gastric/stomach cancer.   Past Medical History:  Diagnosis Date  . Anxiety   . Autoimmune hepatitis ([place])   . Bipolar 1 disorder ([place])   . GERD (gastroesophageal reflux disease)   . Glaucoma 2003   . Heart disease   . Hepatitis, autoimmune ([place]) 08/25/2014  . Hypotension   . Hypothyroidism   . Pacemaker Medtronic    MRI compatible  . Seizures ([place])    last sz 08/21/14  . Sinus arrest   . Vertigo    bvvp    Past Surgical History:  Procedure Laterality Date  . CATARACT EXTRACTION Left 2008  . KNEE SURGERY Left 1985  . PACEMAKER INSERTION  2014  . PARTIAL HYSTERECTOMY  1979    Current Outpatient Medications  Medication Sig Dispense Refill  . aspirin-acetaminophen-caffeine (EXCEDRIN MIGRAINE) 250-250-65 MG tablet Take 1 tablet by mouth every 6 (six) hours as needed for headache.    . azaTHIOprine (IMURAN) 50 MG tablet Take 50 mg by mouth 3 (three) times daily.    Marland Kitchen levETIRAcetam (KEPPRA) 500 MG tablet TAKE 1 TABLET BY MOUTH TWICE A DAY 120 tablet 0  . levothyroxine (SYNTHROID, LEVOTHROID) 100 MCG tablet Take 1 tablet (100 mcg total) by mouth daily before breakfast. 90 tablet 3  . naproxen sodium (ALEVE) 220 MG tablet Take 220 mg by mouth 2 (two) times daily as needed (pain).    . potassium chloride 20 MEQ/15ML (10%) SOLN Take 15 mLs (20 mEq total) by mouth daily for 14 doses. 450 mL 0  . risperiDONE (RISPERDAL) 3 MG tablet Take 3 mg by mouth at  bedtime.     No current facility-administered medications for this visit.    Allergies as of 02/27/2019 - Review Complete 05/16/2017  Allergen Reaction Noted  . Ambien [zolpidem tartrate] Other (See Comments) 02/21/2013  . Amitriptyline Other (See Comments) 02/21/2013  . Ampicillin Other (See Comments) 02/21/2013  . Anaprox [naproxen sodium] Other (See Comments) 02/21/2013  . Benadryl [diphenhydramine hcl (sleep)] Other (See Comments) 02/21/2013  . Cetirizine & related Other (See Comments) 02/21/2013  . Cortizone-10 [hydrocortisone] Other (See Comments) 02/21/2013  . Darvon [propoxyphene] Other (See Comments) 02/21/2013  . Diazepam Other (See Comments) 02/21/2013  . Diflunisal Swelling and Other (See Comments) 02/21/2013  . Duloxetine Other (See Comments) 02/21/2013  . Flexeril [cyclobenzaprine] Other (See Comments) 02/21/2013  .  Lactose intolerance (gi) Swelling and Other (See Comments) 08/23/2014  . Lidocaine Other (See Comments) 02/21/2013  . Meperidine and related Other (See Comments) 02/21/2013  . Metanx [l-methylfolate-algae-b12-b6] Other (See Comments) 02/21/2013  . Metoclopramide Other (See Comments) 02/21/2013  . Morphine and related Other (See Comments) 02/21/2013  . Nuprin [ibuprofen] Other (See Comments) 02/21/2013  . Oxycodone Other (See Comments) 02/21/2013  . Penicillins Other (See Comments) 02/21/2013  . Propoxyphene Other (See Comments) 02/21/2013  . Ranitidine Other (See Comments) 02/21/2013  . Vicodin [hydrocodone-acetaminophen] Other (See Comments) 02/21/2013  . Tylenol [acetaminophen] Rash 02/21/2013    Family History  Problem Relation Age of Onset  . Heart attack Father   . Heart disease Paternal Uncle   . Heart disease Paternal Grandmother   . Diabetes Paternal Grandmother   . Heart disease Paternal Grandfather   . Cancer Mother        BREAST  . Breast cancer Mother   . Cancer Maternal Grandmother   . CVA Maternal Grandmother   . Cancer Maternal  Grandfather   . CVA Maternal Grandfather   . Diabetes Sister   . Diabetes Brother   . Diabetes Brother   . Stroke Daughter     Social History   Socioeconomic History  . Marital status: Married    Spouse name: Not on file  . Number of children: Not on file  . Years of education: Not on file  . Highest education level: Not on file  Occupational History  . Not on file  Tobacco Use  . Smoking status: Former Smoker    Quit date: 03/02/2003    Years since quitting: 15.9  . Smokeless tobacco: Never Used  Substance and Sexual Activity  . Alcohol use: No  . Drug use: No  . Sexual activity: Not on file  Other Topics Concern  . Not on file  Social History Narrative   Lives alone, "have friends and relatives that come and help me"   caffeine use- coffee -1 cup daily   Social Determinants of Health   Financial Resource Strain:   . Difficulty of Paying Living Expenses: Not on file  Food Insecurity:   . Worried About Charity fundraiser in the Last Year: Not on file  . Ran Out of Food in the Last Year: Not on file  Transportation Needs:   . Lack of Transportation (Medical): Not on file  . Lack of Transportation (Non-Medical): Not on file  Physical Activity:   . Days of Exercise per Week: Not on file  . Minutes of Exercise per Session: Not on file  Stress:   . Feeling of Stress : Not on file  Social Connections:   . Frequency of Communication with Friends and Family: Not on file  . Frequency of Social Gatherings with Friends and Family: Not on file  . Attends Religious Services: Not on file  . Active Member of Clubs or Organizations: Not on file  . Attends Archivist Meetings: Not on file  . Marital Status: Not on file  Intimate Partner Violence:   . Fear of Current or Ex-Partner: Not on file  . Emotionally Abused: Not on file  . Physically Abused: Not on file  . Sexually Abused: Not on file    Review of Systems: 12 system ROS is negative except as noted above.  Uses a walker.  Physical Exam: General:   Alert,  well-nourished, pleasant and cooperative in NAD. Sitting in a wheelchair.  Heart:  Regular rate and  rhythm; no murmurs Pulm: Clear anteriorly; no wheezing Abdomen:  Soft. Central obesity. Nontender. Nondistended. Normal bowel sounds. No rebound or guarding. No fluid wave.  LAD: No inguinal or umbilical LAD Extremities:  Without edema. Neurologic:  Alert and  oriented x4;  grossly normal neurologically; no asterixis or clonus. Skin: No jaundice. Palmar erythema. Spider angioma on the chest wall.   Psych:  Alert and cooperative. Normal mood and affect.     Khameron Gruenwald L. Tarri Glenn, MD, MPH 02/25/2019, 4:42 PM

## 2019-02-26 ENCOUNTER — Encounter: Payer: Self-pay | Admitting: Emergency Medicine

## 2019-02-27 ENCOUNTER — Other Ambulatory Visit (INDEPENDENT_AMBULATORY_CARE_PROVIDER_SITE_OTHER): Payer: Medicare Other

## 2019-02-27 ENCOUNTER — Encounter: Payer: Self-pay | Admitting: Gastroenterology

## 2019-02-27 ENCOUNTER — Ambulatory Visit (INDEPENDENT_AMBULATORY_CARE_PROVIDER_SITE_OTHER): Payer: Medicare Other | Admitting: Gastroenterology

## 2019-02-27 ENCOUNTER — Other Ambulatory Visit: Payer: Self-pay

## 2019-02-27 VITALS — BP 130/86 | HR 82 | Temp 97.9°F

## 2019-02-27 DIAGNOSIS — K625 Hemorrhage of anus and rectum: Secondary | ICD-10-CM | POA: Diagnosis not present

## 2019-02-27 DIAGNOSIS — I8500 Esophageal varices without bleeding: Secondary | ICD-10-CM

## 2019-02-27 DIAGNOSIS — K7469 Other cirrhosis of liver: Secondary | ICD-10-CM

## 2019-02-27 DIAGNOSIS — K754 Autoimmune hepatitis: Secondary | ICD-10-CM | POA: Diagnosis not present

## 2019-02-27 DIAGNOSIS — I8510 Secondary esophageal varices without bleeding: Secondary | ICD-10-CM

## 2019-02-27 LAB — CBC
HCT: 38.7 % (ref 36.0–46.0)
Hemoglobin: 12.9 g/dL (ref 12.0–15.0)
MCHC: 33.2 g/dL (ref 30.0–36.0)
MCV: 96.5 fl (ref 78.0–100.0)
Platelets: 69 10*3/uL — ABNORMAL LOW (ref 150.0–400.0)
RBC: 4.01 Mil/uL (ref 3.87–5.11)
RDW: 14.4 % (ref 11.5–15.5)
WBC: 2.2 10*3/uL — ABNORMAL LOW (ref 4.0–10.5)

## 2019-02-27 LAB — COMPREHENSIVE METABOLIC PANEL
ALT: 44 U/L — ABNORMAL HIGH (ref 0–35)
AST: 50 U/L — ABNORMAL HIGH (ref 0–37)
Albumin: 2.8 g/dL — ABNORMAL LOW (ref 3.5–5.2)
Alkaline Phosphatase: 77 U/L (ref 39–117)
BUN: 12 mg/dL (ref 6–23)
CO2: 24 mEq/L (ref 19–32)
Calcium: 8.3 mg/dL — ABNORMAL LOW (ref 8.4–10.5)
Chloride: 105 mEq/L (ref 96–112)
Creatinine, Ser: 0.71 mg/dL (ref 0.40–1.20)
GFR: 80.32 mL/min (ref >60.00)
Glucose, Bld: 79 mg/dL (ref 70–99)
Potassium: 3.4 mEq/L — ABNORMAL LOW (ref 3.5–5.1)
Sodium: 132 mEq/L — ABNORMAL LOW (ref 135–145)
Total Bilirubin: 0.7 mg/dL (ref 0.2–1.2)
Total Protein: 7.6 g/dL (ref 6.0–8.3)

## 2019-02-27 LAB — PROTIME-INR
INR: 1.3 ratio — ABNORMAL HIGH (ref 0.8–1.0)
Prothrombin Time: 14.8 s — ABNORMAL HIGH (ref 9.6–13.1)

## 2019-02-27 NOTE — Patient Instructions (Signed)
Your provider has requested that you go to the basement level for lab work before leaving today. Press "B" on the elevator. The lab is located at the first door on the left as you exit the elevator.  A high fiber diet with plenty of fluids (up to 8 glasses of water daily) is suggested to relieve these symptoms.  Metamucil, 1 tablespoon once or twice daily can be used to keep bowels regular if needed.  You have been scheduled for an abdominal ultrasound at [facility] (1st floor of hospital) on 03/05/19 at 10:00am. Please arrive 15 minutes prior to your appointment for registration. Make certain not to have anything to eat or drink 6 hours prior to your appointment. Should you need to reschedule your appointment, please contact radiology at [phone]. This test typically takes about 30 minutes to perform.  We will call you to schedule your procedures at the hospital.

## 2019-03-01 ENCOUNTER — Other Ambulatory Visit: Payer: Self-pay | Admitting: *Deleted

## 2019-03-01 DIAGNOSIS — E876 Hypokalemia: Secondary | ICD-10-CM

## 2019-03-01 MED ORDER — POTASSIUM CHLORIDE ER 10 MEQ PO TBCR: 20.0000 meq | EXTENDED_RELEASE_TABLET | Freq: Two times a day (BID) | ORAL | 0 refills | Status: DC

## 2019-03-01 NOTE — Addendum Note (Signed)
Addended by: Wyline Beady on: 03/01/2019 10:19 AM   Modules accepted: Orders

## 2019-03-02 ENCOUNTER — Other Ambulatory Visit: Payer: Self-pay

## 2019-03-02 ENCOUNTER — Other Ambulatory Visit (HOSPITAL_COMMUNITY)
Admission: RE | Admit: 2019-03-02 | Discharge: 2019-03-02 | Disposition: A | Discharge status: Home / Self Care | Payer: Medicare Other | Source: Ambulatory Visit | Attending: Gastroenterology | Admitting: Gastroenterology

## 2019-03-02 ENCOUNTER — Encounter (HOSPITAL_COMMUNITY): Payer: Self-pay | Admitting: Gastroenterology

## 2019-03-02 DIAGNOSIS — Z01812 Encounter for preprocedural laboratory examination: Secondary | ICD-10-CM | POA: Diagnosis not present

## 2019-03-02 DIAGNOSIS — Z20822 Contact with and (suspected) exposure to covid-19: Secondary | ICD-10-CM | POA: Diagnosis not present

## 2019-03-02 LAB — SARS CORONAVIRUS 2 (TAT 6-24 HRS): SARS Coronavirus 2: NEGATIVE

## 2019-03-02 NOTE — Progress Notes (Signed)
Endo call made. Pt reported that Dr. Modena Nunnery plans on having  Her to be admitted for another procedure on 03/05/19, so as a result she will be an inpatient for her 03-06-19 Esophagogastroduodenectomy procedure.

## 2019-03-03 LAB — IGG: IgG (Immunoglobin G), Serum: 3501 mg/dL — ABNORMAL HIGH (ref 600–1540)

## 2019-03-03 LAB — AFP TUMOR MARKER: AFP-Tumor Marker: 1.7 ng/mL

## 2019-03-03 LAB — ANTI-SMOOTH MUSCLE ANTIBODY, IGG: Actin (Smooth Muscle) Antibody (IGG): 54 U — ABNORMAL HIGH (ref <20)

## 2019-03-05 ENCOUNTER — Telehealth: Payer: Self-pay | Admitting: Gastroenterology

## 2019-03-05 ENCOUNTER — Other Ambulatory Visit: Payer: Self-pay

## 2019-03-05 ENCOUNTER — Observation Stay (HOSPITAL_COMMUNITY)
Admission: AD | Admit: 2019-03-05 | Discharge: 2019-03-06 | Disposition: A | Discharge status: Home / Self Care | Payer: Medicare Other | Source: Ambulatory Visit | Attending: Gastroenterology | Admitting: Gastroenterology

## 2019-03-05 ENCOUNTER — Ambulatory Visit (HOSPITAL_COMMUNITY)
Admission: RE | Admit: 2019-03-05 | Discharge: 2019-03-05 | Disposition: A | Discharge status: Home / Self Care | Payer: Medicare Other | Source: Ambulatory Visit | Attending: Gastroenterology | Admitting: Gastroenterology

## 2019-03-05 DIAGNOSIS — D128 Benign neoplasm of rectum: Secondary | ICD-10-CM | POA: Insufficient documentation

## 2019-03-05 DIAGNOSIS — K621 Rectal polyp: Secondary | ICD-10-CM

## 2019-03-05 DIAGNOSIS — K635 Polyp of colon: Secondary | ICD-10-CM | POA: Diagnosis not present

## 2019-03-05 DIAGNOSIS — I252 Old myocardial infarction: Secondary | ICD-10-CM | POA: Diagnosis not present

## 2019-03-05 DIAGNOSIS — K648 Other hemorrhoids: Secondary | ICD-10-CM | POA: Insufficient documentation

## 2019-03-05 DIAGNOSIS — K754 Autoimmune hepatitis: Secondary | ICD-10-CM

## 2019-03-05 DIAGNOSIS — I8511 Secondary esophageal varices with bleeding: Secondary | ICD-10-CM | POA: Diagnosis not present

## 2019-03-05 DIAGNOSIS — Z950 Presence of cardiac pacemaker: Secondary | ICD-10-CM | POA: Insufficient documentation

## 2019-03-05 DIAGNOSIS — K766 Portal hypertension: Secondary | ICD-10-CM | POA: Diagnosis not present

## 2019-03-05 DIAGNOSIS — K625 Hemorrhage of anus and rectum: Secondary | ICD-10-CM | POA: Diagnosis not present

## 2019-03-05 DIAGNOSIS — K3189 Other diseases of stomach and duodenum: Secondary | ICD-10-CM | POA: Diagnosis not present

## 2019-03-05 DIAGNOSIS — D120 Benign neoplasm of cecum: Secondary | ICD-10-CM | POA: Insufficient documentation

## 2019-03-05 DIAGNOSIS — Z8673 Personal history of transient ischemic attack (TIA), and cerebral infarction without residual deficits: Secondary | ICD-10-CM | POA: Insufficient documentation

## 2019-03-05 DIAGNOSIS — K259 Gastric ulcer, unspecified as acute or chronic, without hemorrhage or perforation: Secondary | ICD-10-CM | POA: Diagnosis not present

## 2019-03-05 DIAGNOSIS — K2950 Unspecified chronic gastritis without bleeding: Secondary | ICD-10-CM | POA: Insufficient documentation

## 2019-03-05 DIAGNOSIS — Z86010 Personal history of colonic polyps: Secondary | ICD-10-CM | POA: Insufficient documentation

## 2019-03-05 DIAGNOSIS — Z79890 Hormone replacement therapy (postmenopausal): Secondary | ICD-10-CM | POA: Insufficient documentation

## 2019-03-05 DIAGNOSIS — Z87891 Personal history of nicotine dependence: Secondary | ICD-10-CM | POA: Diagnosis not present

## 2019-03-05 DIAGNOSIS — D124 Benign neoplasm of descending colon: Secondary | ICD-10-CM | POA: Diagnosis not present

## 2019-03-05 DIAGNOSIS — N281 Cyst of kidney, acquired: Secondary | ICD-10-CM | POA: Diagnosis not present

## 2019-03-05 DIAGNOSIS — I8500 Esophageal varices without bleeding: Secondary | ICD-10-CM

## 2019-03-05 DIAGNOSIS — D122 Benign neoplasm of ascending colon: Secondary | ICD-10-CM

## 2019-03-05 LAB — CBC
HCT: 37.3 % (ref 36.0–46.0)
Hemoglobin: 12.4 g/dL (ref 12.0–15.0)
MCH: 32.6 pg (ref 26.0–34.0)
MCHC: 33.2 g/dL (ref 30.0–36.0)
MCV: 98.2 fL (ref 80.0–100.0)
Platelets: 67 10*3/uL — ABNORMAL LOW (ref 150–400)
RBC: 3.8 MIL/uL — ABNORMAL LOW (ref 3.87–5.11)
RDW: 14.4 % (ref 11.5–15.5)
WBC: 2.1 10*3/uL — ABNORMAL LOW (ref 4.0–10.5)
nRBC: 0 % (ref 0.0–0.2)

## 2019-03-05 LAB — COMPREHENSIVE METABOLIC PANEL
ALT: 44 U/L (ref 0–44)
AST: 57 U/L — ABNORMAL HIGH (ref 15–41)
Albumin: 2.6 g/dL — ABNORMAL LOW (ref 3.5–5.0)
Alkaline Phosphatase: 70 U/L (ref 38–126)
Anion gap: 5 (ref 5–15)
BUN: 13 mg/dL (ref 8–23)
CO2: 22 mmol/L (ref 22–32)
Calcium: 8 mg/dL — ABNORMAL LOW (ref 8.9–10.3)
Chloride: 109 mmol/L (ref 98–111)
Creatinine, Ser: 0.8 mg/dL (ref 0.44–1.00)
GFR calc Af Amer: >60 mL/min (ref >60)
GFR calc non Af Amer: >60 mL/min (ref >60)
Glucose, Bld: 84 mg/dL (ref 70–99)
Potassium: 4.4 mmol/L (ref 3.5–5.1)
Sodium: 136 mmol/L (ref 135–145)
Total Bilirubin: 0.9 mg/dL (ref 0.3–1.2)
Total Protein: 7.1 g/dL (ref 6.5–8.1)

## 2019-03-05 LAB — PROTIME-INR
INR: 1.4 — ABNORMAL HIGH (ref 0.8–1.2)
Prothrombin Time: 16.6 seconds — ABNORMAL HIGH (ref 11.4–15.2)

## 2019-03-05 IMAGING — US US ABDOMEN COMPLETE
1 series · 14 of 25 positions shown · non-contrast
Comparison: [DATE]

CLINICAL DATA: Autoimmune hepatitis

EXAM:
ABDOMEN ULTRASOUND COMPLETE

[Series 1: us abdomen complete · 14 of 72 slices shown]
[im 1/72]
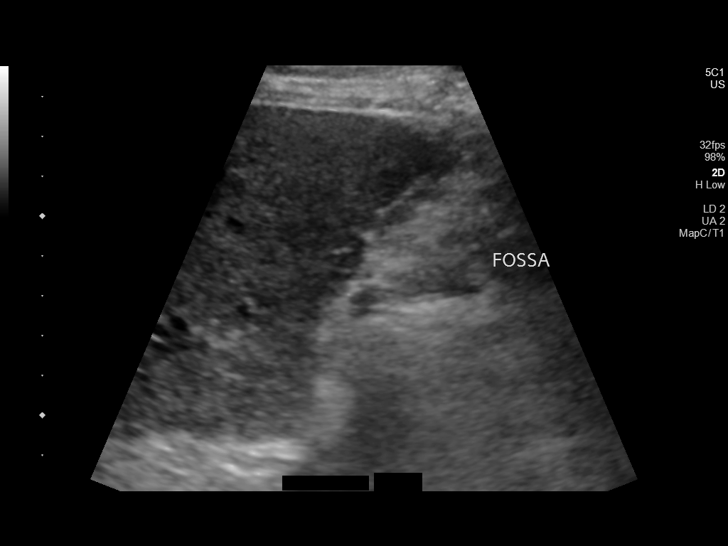
[im 6/72]
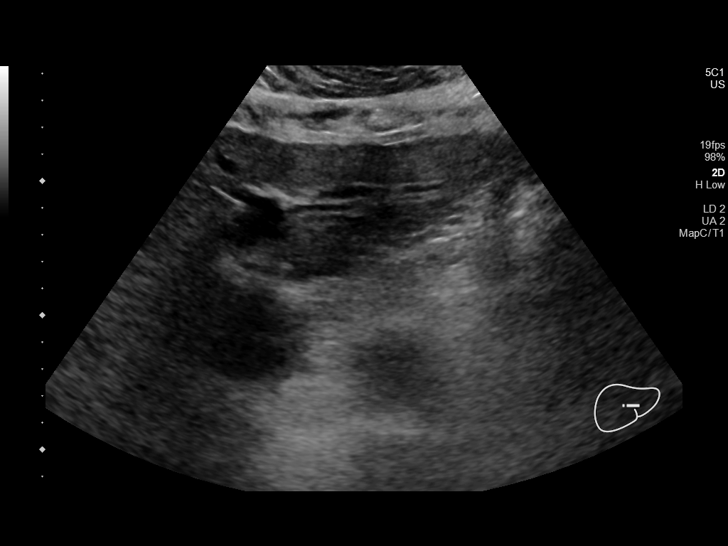
[im 12/72]
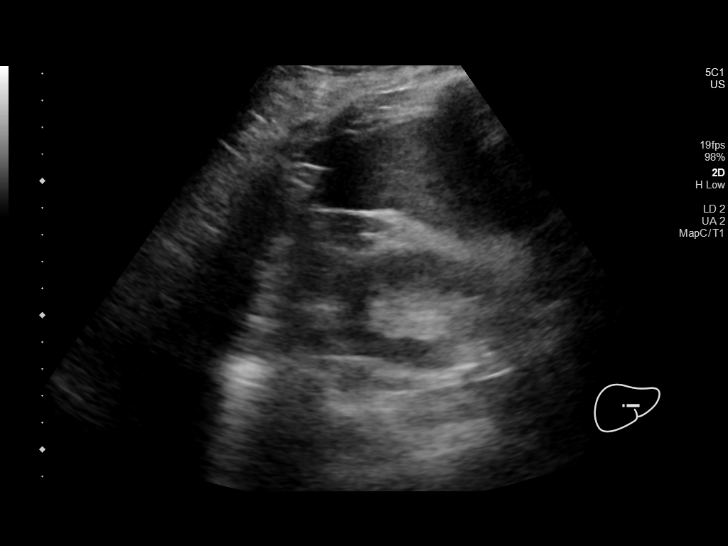
[im 18/72]
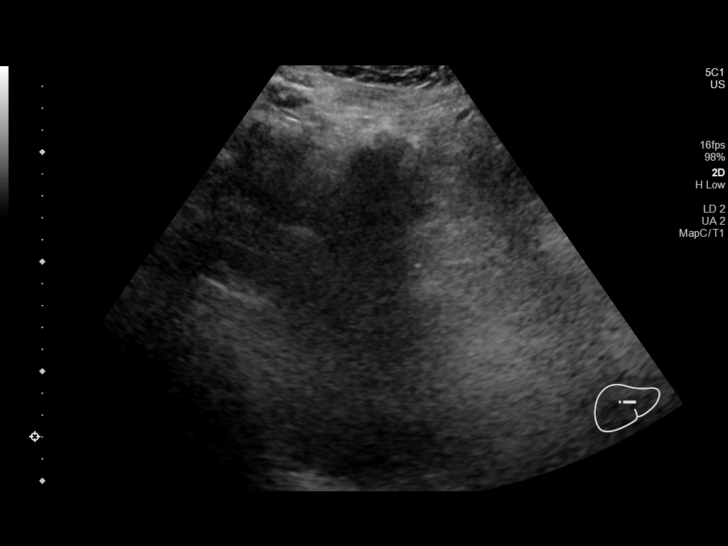
[im 24/72]
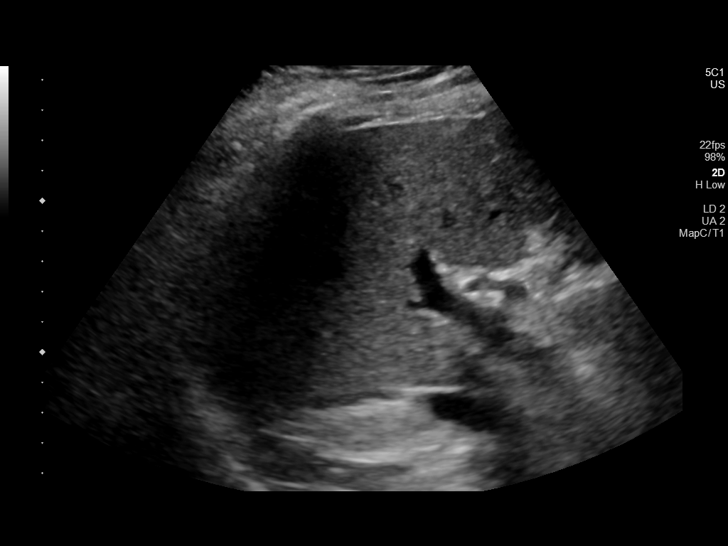
[im 27/72]
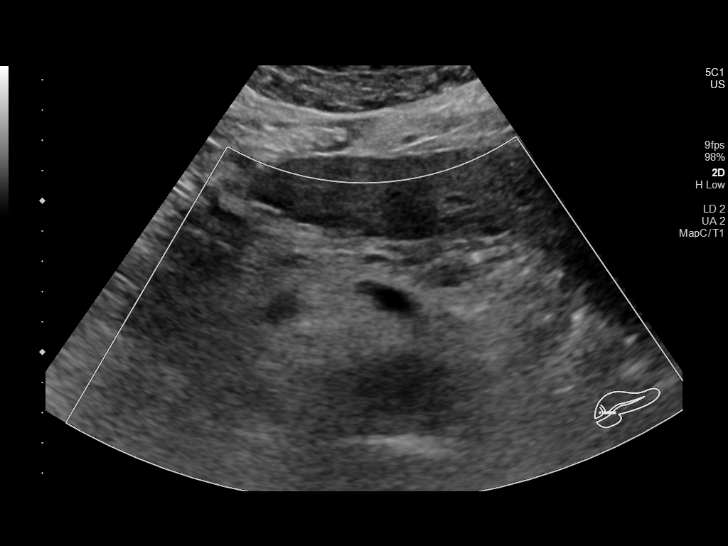
[im 33/72]
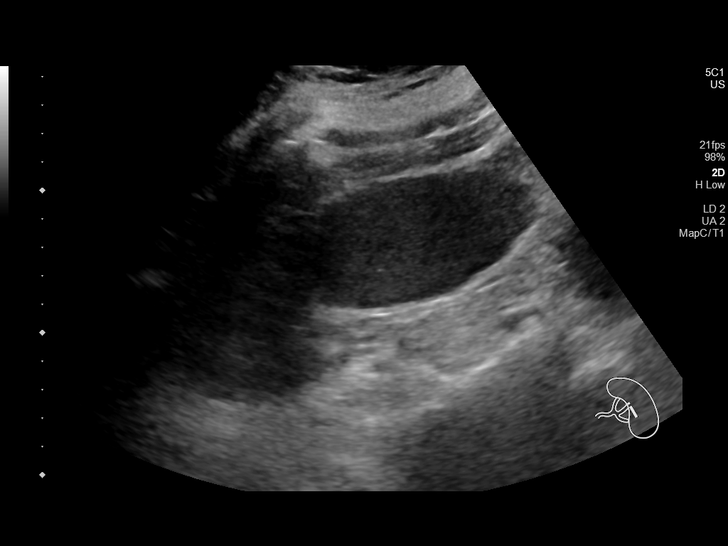
[im 39/72]
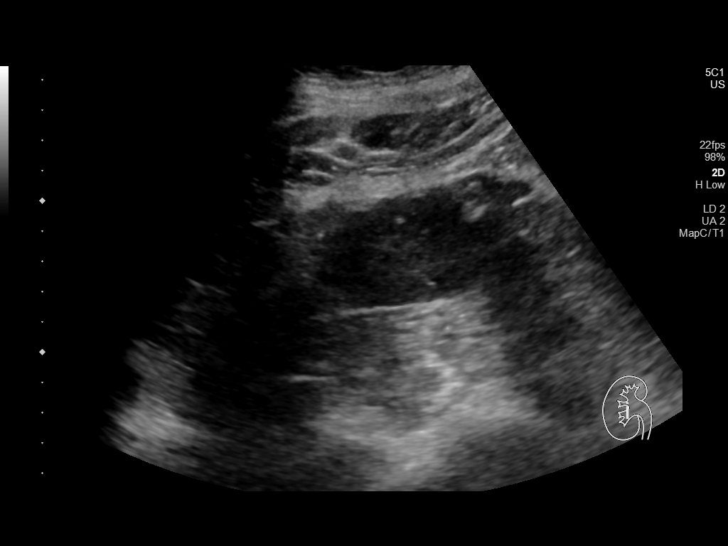
[im 45/72]
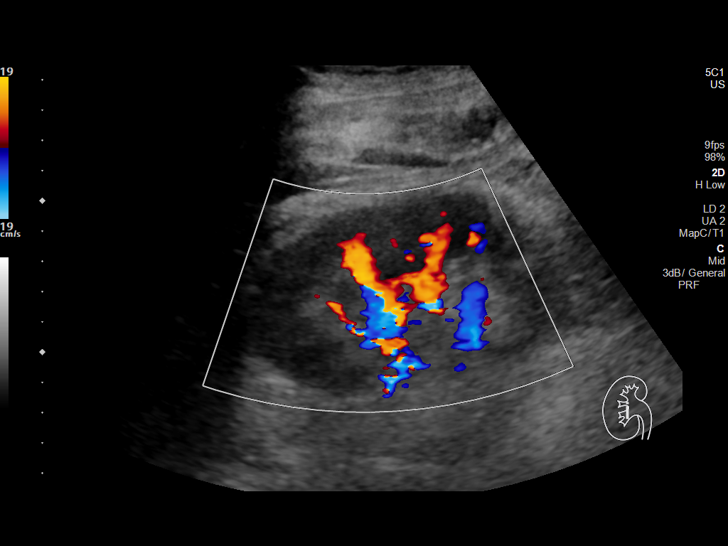
[im 48/72]
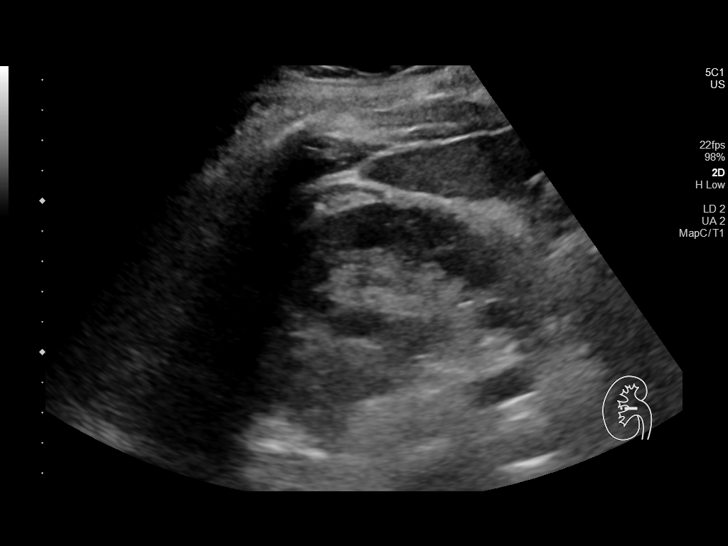
[im 54/72]
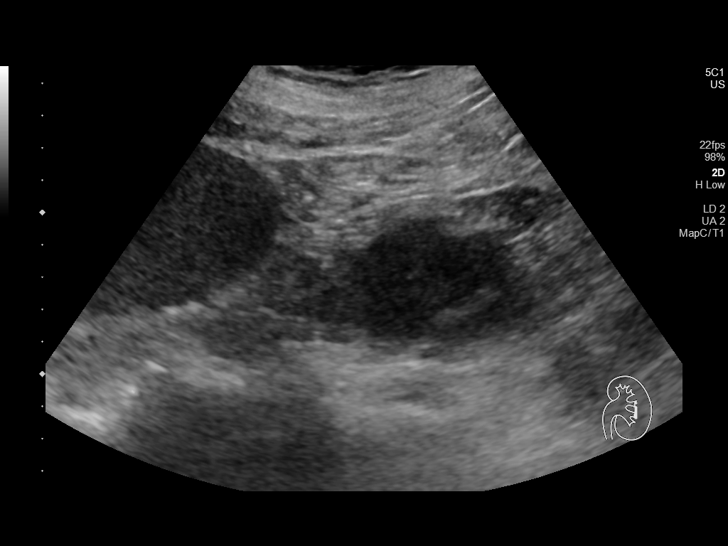
[im 60/72]
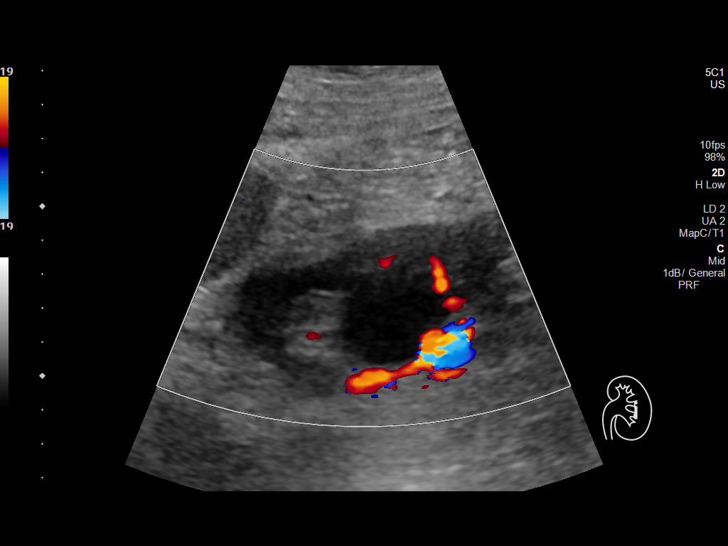
[im 66/72]
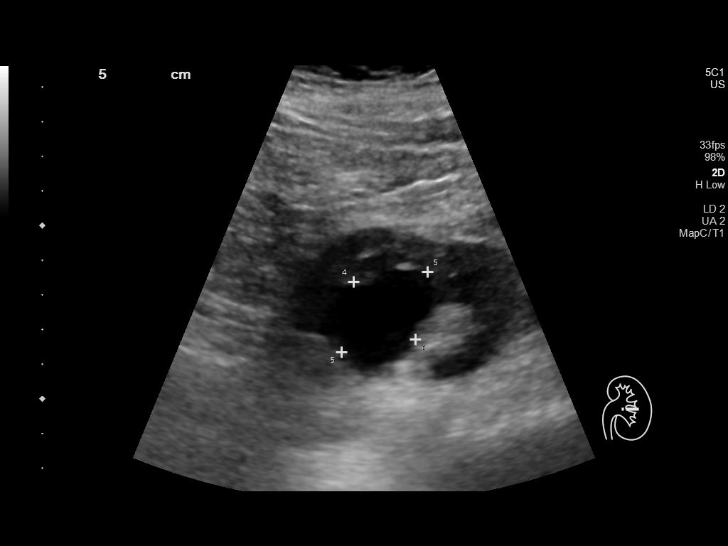
[im 72/72]
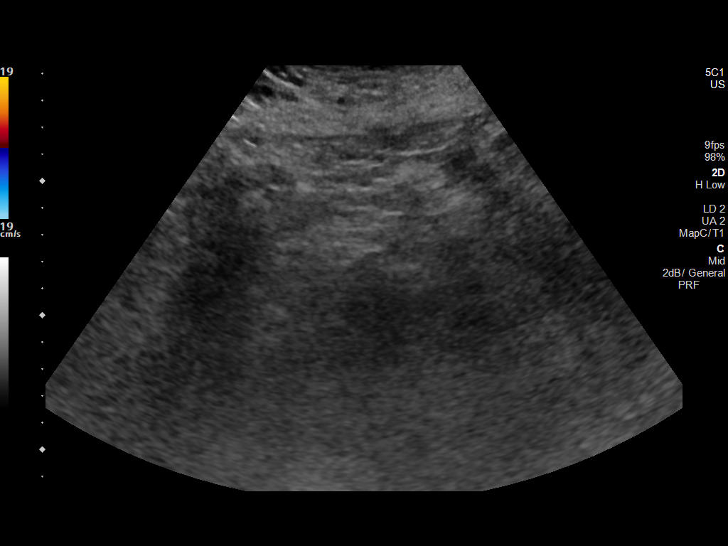

[14 of 25 positions shown; findings below may reference images not displayed]

FINDINGS: Gallbladder: Surgically removed

Common bile duct: Diameter: 5.5 mm.

Liver: Diffusely heterogeneous similar to that noted on the prior
exam. Nodularity of the hepatic contour is noted suggestive of
cirrhosis. No definitive mass is seen. Portal vein is patent on
color Doppler imaging with normal direction of blood flow towards
the liver.

IVC: No abnormality visualized.

Pancreas: Visualized portion unremarkable.

Spleen: Mildly enlarged measuring 14.6 cm in greatest dimension.
Calculated volume is 446 mL.

Right Kidney: Length: 12.5 cm. Echogenicity within normal limits.
No mass or hydronephrosis visualized.

Left Kidney: Length: 11.6 cm. Cysts are noted within the left
kidney. The largest of these measures 3.4 cm centrally in a
parapelvic region.

Abdominal aorta: No aneurysm visualized.

Other findings: None.
IMPRESSION: Changes consistent with cirrhosis of the liver.

Status post cholecystectomy.

Splenomegaly.

Left renal cysts.

## 2019-03-05 MED ORDER — ONDANSETRON HCL 4 MG/2ML IJ SOLN: 4.0000 mg | Freq: Four times a day (QID) | INTRAMUSCULAR | Status: DC | PRN

## 2019-03-05 MED ORDER — SODIUM CHLORIDE 0.9 % IV SOLN: INTRAVENOUS | Status: DC

## 2019-03-05 MED ORDER — ONDANSETRON HCL 4 MG PO TABS: 4.0000 mg | ORAL_TABLET | Freq: Four times a day (QID) | ORAL | Status: DC | PRN

## 2019-03-05 MED ORDER — PEG-KCL-NACL-NASULF-NA ASC-C 100 G PO SOLR: 1.0000 | Freq: Once | ORAL | Status: DC

## 2019-03-05 MED ORDER — LEVOTHYROXINE SODIUM 50 MCG PO TABS
50.0000 ug | ORAL_TABLET | Freq: Every day | ORAL | Status: DC
  Administered 2019-03-06: 50 ug via ORAL
  Filled 2019-03-05: qty 1

## 2019-03-05 MED ORDER — SODIUM CHLORIDE 0.9% FLUSH: 3.0000 mL | Freq: Two times a day (BID) | INTRAVENOUS | Status: DC

## 2019-03-05 MED ORDER — SODIUM CHLORIDE 0.9 % IV SOLN: 250.0000 mL | INTRAVENOUS | Status: DC | PRN

## 2019-03-05 MED ORDER — PEG-KCL-NACL-NASULF-NA ASC-C 100 G PO SOLR
0.5000 | Freq: Once | ORAL | Status: AC
  Administered 2019-03-05: 100 g via ORAL
  Filled 2019-03-05: qty 1

## 2019-03-05 MED ORDER — PEG-KCL-NACL-NASULF-NA ASC-C 100 G PO SOLR
0.5000 | Freq: Once | ORAL | Status: AC
  Administered 2019-03-06: 100 g via ORAL
  Filled 2019-03-05: qty 1

## 2019-03-05 MED ORDER — SODIUM CHLORIDE 0.9% FLUSH: 3.0000 mL | INTRAVENOUS | Status: DC | PRN

## 2019-03-05 NOTE — H&P (Signed)
 Primary Care Physician:  Lamb, Andrew S, MD Primary Gastroenterologist:  Dr. Beavers  CHIEF COMPLAINT:  Rectal bleeding and change in bowel habits  HPI: Arisha B Pettway is a 74 y.o. female with past medical history significant for anxiety, autoimmune hepatitis with suspected cirrhosis, bipolar 1 disorder, fibromyalgia, GERD, hypothyroidism, history of seizures last in 2016.  Was seen in the office by Dr. Beavers on February 2 with complaints of rectal bleeding and change in stool caliber.  Has history of advanced colon polyps.  Is being admitted [facility] for preparation of EGD and colonoscopy on February 9 due to transportation issues.  Reports no change in symptoms as compared to 6 days ago when she was seen by Dr. Beavers in our office.  Please see OV noted from 02/26/2018.   Past Medical History:  Diagnosis Date  . Anxiety   . Autoimmune hepatitis (HCC)   . Bipolar 1 disorder (HCC)   . Carpal tunnel syndrome   . Cutaneous horn   . Drug-induced Parkinsonism (HCC)   . Fibromyalgia   . GERD (gastroesophageal reflux disease)   . Glaucoma 2003   . Heart attack (HCC)   . Heart disease   . Hepatitis, autoimmune (HCC) 08/25/2014  . Hypotension   . Hypothyroidism   . Neutropenia (HCC)   . Pacemaker Medtronic    MRI compatible  . Paranoid schizophrenia (HCC)   . Polyarthritis   . Seizures (HCC)    last sz 08/21/14  . Sinus arrest   . Stroke (HCC)   . Vertigo    bvvp    Past Surgical History:  Procedure Laterality Date  . CATARACT EXTRACTION Left 2008  . KNEE SURGERY Left 1985  . PACEMAKER INSERTION  2014  . PARTIAL HYSTERECTOMY  1979    Prior to Admission medications   Medication Sig Start Date End Date Taking? Authorizing Provider  levothyroxine (SYNTHROID) 50 MCG tablet Take 50 mcg by mouth daily before breakfast. 12/27/18  Yes [provider]  traMADol (ULTRAM) 50 MG tablet Take 50 mg by mouth every 4 (four) hours as needed (for pain.).    Yes  [provider]  potassium chloride (KLOR-CON) 10 MEQ tablet Take 2 tablets (20 mEq total) by mouth 2 (two) times daily for 3 days. 03/01/19 03/04/19  Beavers, Kimberly, MD    Current Facility-Administered Medications  Medication Dose Route Frequency Provider Last Rate Last Admin  . 0.9 %  sodium chloride infusion   Intravenous Continuous Beavers, Kimberly, MD      . 0.9 %  sodium chloride infusion  250 mL Intravenous PRN ,  D, PA-C      . ondansetron (ZOFRAN) tablet 4 mg  4 mg Oral Q6H PRN ,  D, PA-C       Or  . ondansetron (ZOFRAN) injection 4 mg  4 mg Intravenous Q6H PRN ,  D, PA-C      . peg 3350 powder (MOVIPREP) kit 100 g  0.5 kit Oral Once Green, Terri L, RPH       And  . [START ON 03/06/2019] peg 3350 powder (MOVIPREP) kit 100 g  0.5 kit Oral Once Green, Terri L, RPH      . sodium chloride flush (NS) 0.9 % injection 3 mL  3 mL Intravenous Q12H ,  D, PA-C      . sodium chloride flush (NS) 0.9 % injection 3 mL  3 mL Intravenous PRN ,  D, PA-C          Allergies as of 02/27/2019 - Review Complete 02/27/2019  Allergen Reaction Noted  . Ambien [zolpidem tartrate] Other (See Comments) 02/21/2013  . Amitriptyline Other (See Comments) 02/21/2013  . Ampicillin Other (See Comments) 02/21/2013  . Anaprox [naproxen sodium] Other (See Comments) 02/21/2013  . Benadryl [diphenhydramine hcl (sleep)] Other (See Comments) 02/21/2013  . Cetirizine & related Other (See Comments) 02/21/2013  . Cortizone-10 [hydrocortisone] Other (See Comments) 02/21/2013  . Darvon [propoxyphene] Other (See Comments) 02/21/2013  . Diazepam Other (See Comments) 02/21/2013  . Diflunisal Swelling and Other (See Comments) 02/21/2013  . Duloxetine Other (See Comments) 02/21/2013  . Flexeril [cyclobenzaprine] Other (See Comments) 02/21/2013  . Lactose intolerance (gi) Swelling and Other (See Comments) 08/23/2014  . Lidocaine Other (See Comments) 02/21/2013  .  Meperidine and related Other (See Comments) 02/21/2013  . Metanx [l-methylfolate-algae-b12-b6] Other (See Comments) 02/21/2013  . Metoclopramide Other (See Comments) 02/21/2013  . Morphine and related Other (See Comments) 02/21/2013  . Nuprin [ibuprofen] Other (See Comments) 02/21/2013  . Oxycodone Other (See Comments) 02/21/2013  . Penicillins Other (See Comments) 02/21/2013  . Propoxyphene Other (See Comments) 02/21/2013  . Ranitidine Other (See Comments) 02/21/2013  . Vicodin [hydrocodone-acetaminophen] Other (See Comments) 02/21/2013  . Tylenol [acetaminophen] Rash 02/21/2013    Family History  Problem Relation Age of Onset  . Heart attack Father   . Heart disease Paternal Uncle   . Heart disease Paternal Grandmother   . Diabetes Paternal Grandmother   . Heart disease Paternal Grandfather   . Breast cancer Mother   . Cancer Maternal Grandmother   . CVA Maternal Grandmother   . Cancer Maternal Grandfather   . CVA Maternal Grandfather   . Diabetes Sister   . Diabetes Brother   . Diabetes Brother   . Stroke Daughter     Social History   Socioeconomic History  . Marital status: Widowed    Spouse name: Not on file  . Number of children: Not on file  . Years of education: Not on file  . Highest education level: Not on file  Occupational History  . Not on file  Tobacco Use  . Smoking status: Former Smoker    Quit date: 03/02/2003    Years since quitting: 16.0  . Smokeless tobacco: Never Used  Substance and Sexual Activity  . Alcohol use: No  . Drug use: No  . Sexual activity: Not on file  Other Topics Concern  . Not on file  Social History Narrative   Lives alone, "have friends and relatives that come and help me"   caffeine use- coffee -1 cup daily   Social Determinants of Health   Financial Resource Strain:   . Difficulty of Paying Living Expenses: Not on file  Food Insecurity:   . Worried About Charity fundraiser in the Last Year: Not on file  . Ran Out of  Food in the Last Year: Not on file  Transportation Needs:   . Lack of Transportation (Medical): Not on file  . Lack of Transportation (Non-Medical): Not on file  Physical Activity:   . Days of Exercise per Week: Not on file  . Minutes of Exercise per Session: Not on file  Stress:   . Feeling of Stress : Not on file  Social Connections:   . Frequency of Communication with Friends and Family: Not on file  . Frequency of Social Gatherings with Friends and Family: Not on file  . Attends Religious Services: Not on file  . Active Member of Clubs  or Organizations: Not on file  . Attends Archivist Meetings: Not on file  . Marital Status: Not on file  Intimate Partner Violence:   . Fear of Current or Ex-Partner: Not on file  . Emotionally Abused: Not on file  . Physically Abused: Not on file  . Sexually Abused: Not on file    Review of Systems: ROS is O/W negative except as mentioned in HPI.  Physical Exam: Vital signs in last 24 hours: Temp:  [97.7 F (36.5 C)-97.9 F (36.6 C)] 97.7 F (36.5 C) (02/08 1332) Pulse Rate:  [70-72] 70 (02/08 1332) Resp:  [16] 16 (02/08 1332) BP: (109-126)/(47-64) 109/47 (02/08 1332) SpO2:  [100 %] 100 % (02/08 1332)   General:  Alert, Well-developed, well-nourished, pleasant and cooperative in NAD Head:  Normocephalic and atraumatic. Eyes:  Sclera clear, no icterus.  Conjunctiva pink. Ears:  Normal auditory acuity. Mouth:  No deformity or lesions.  Oropharynx pink & moist. Lungs:  Clear throughout to auscultation.  No wheezes, crackles, or rhonchi. No acute distress. Heart:  Regular rate and rhythm; no murmurs, clicks, rubs, or gallops. Abdomen:  Soft, non-distended.  BS present.  Diffuse TTP. Rectal:  Deferred until time of colonoscopy.   Msk:  Symmetrical without gross deformities. Normal posture. Pulses:  Normal pulses noted. Extremities:  Trace to 1+ pitting edema noted in B/L LE's. Neurologic:  Alert and oriented x 4;  grossly  normal neurologically. Skin:  Intact without significant lesions or rashes. Psych:  Alert and cooperative. Normal mood and affect.  Impression / Plan: *74 year old female with rectal bleeding and change in stool caliber.  Has history of advanced colon polyps.  Plan is for admission and colonoscopy with Dr. Tarri Glenn on February 9. *Autoimmune hepatitis previously on azathioprine with suspected cirrhosis as there were esophageal varices on EGD in January 2018.  Plan is for EGD with Dr. Tarri Glenn on February 9. *Hypothyroidism:  Will restart home levothyroxine.  -We will plan for admission for procedures on February 9.  Can have clear liquids today then n.p.o. after midnight except for bowel prep, which has been ordered. -We will restart necessary home meds. -We will check labs today including CBC, CMP, PT/INR. -Zofran ordered prn for nausea, but no pain medications ordered due to several medications "allergies".    LOS: 1 day   Laban Emperor. Davene Jobin  03/05/2019, 2:42 PM

## 2019-03-05 NOTE — Telephone Encounter (Signed)
Stacey Velazquez called from [facility] hospital states this patient is scheduled for a double tomm and they are requesting floor orders.

## 2019-03-05 NOTE — Telephone Encounter (Signed)
Desi, do you know anything about this?

## 2019-03-05 NOTE — Plan of Care (Signed)
Plan of care for today dicussed with patient.   SWhittemore, Therapist, sports

## 2019-03-05 NOTE — Telephone Encounter (Signed)
Orders were placed by the  GI PA at the hospital.

## 2019-03-06 ENCOUNTER — Observation Stay (HOSPITAL_COMMUNITY): Payer: Medicare Other | Admitting: Certified Registered Nurse Anesthetist

## 2019-03-06 ENCOUNTER — Encounter (HOSPITAL_COMMUNITY)
Admission: AD | Disposition: A | Discharge status: Home / Self Care | Payer: Self-pay | Source: Ambulatory Visit | Attending: Gastroenterology

## 2019-03-06 DIAGNOSIS — K259 Gastric ulcer, unspecified as acute or chronic, without hemorrhage or perforation: Secondary | ICD-10-CM | POA: Diagnosis not present

## 2019-03-06 DIAGNOSIS — K754 Autoimmune hepatitis: Secondary | ICD-10-CM | POA: Diagnosis not present

## 2019-03-06 DIAGNOSIS — K3189 Other diseases of stomach and duodenum: Secondary | ICD-10-CM | POA: Diagnosis not present

## 2019-03-06 DIAGNOSIS — K635 Polyp of colon: Secondary | ICD-10-CM | POA: Diagnosis not present

## 2019-03-06 DIAGNOSIS — Z86010 Personal history of colonic polyps: Secondary | ICD-10-CM | POA: Diagnosis not present

## 2019-03-06 DIAGNOSIS — K621 Rectal polyp: Secondary | ICD-10-CM

## 2019-03-06 DIAGNOSIS — D124 Benign neoplasm of descending colon: Secondary | ICD-10-CM | POA: Diagnosis not present

## 2019-03-06 DIAGNOSIS — I8510 Secondary esophageal varices without bleeding: Secondary | ICD-10-CM | POA: Diagnosis not present

## 2019-03-06 DIAGNOSIS — D120 Benign neoplasm of cecum: Secondary | ICD-10-CM | POA: Diagnosis not present

## 2019-03-06 DIAGNOSIS — K766 Portal hypertension: Secondary | ICD-10-CM

## 2019-03-06 DIAGNOSIS — D128 Benign neoplasm of rectum: Secondary | ICD-10-CM | POA: Diagnosis not present

## 2019-03-06 DIAGNOSIS — K625 Hemorrhage of anus and rectum: Secondary | ICD-10-CM | POA: Diagnosis not present

## 2019-03-06 DIAGNOSIS — D122 Benign neoplasm of ascending colon: Secondary | ICD-10-CM

## 2019-03-06 DIAGNOSIS — K7460 Unspecified cirrhosis of liver: Secondary | ICD-10-CM | POA: Diagnosis not present

## 2019-03-06 DIAGNOSIS — K2950 Unspecified chronic gastritis without bleeding: Secondary | ICD-10-CM | POA: Diagnosis not present

## 2019-03-06 DIAGNOSIS — I8511 Secondary esophageal varices with bleeding: Secondary | ICD-10-CM | POA: Diagnosis not present

## 2019-03-06 HISTORY — PX: COLONOSCOPY WITH PROPOFOL: SHX5780

## 2019-03-06 HISTORY — PX: POLYPECTOMY: SHX5525

## 2019-03-06 HISTORY — PX: ESOPHAGOGASTRODUODENOSCOPY (EGD) WITH PROPOFOL: SHX5813

## 2019-03-06 HISTORY — PX: BIOPSY: SHX5522

## 2019-03-06 SURGERY — ESOPHAGOGASTRODUODENOSCOPY (EGD) WITH PROPOFOL
Anesthesia: Monitor Anesthesia Care

## 2019-03-06 MED ORDER — PHYTONADIONE 5 MG PO TABS
10.0000 mg | ORAL_TABLET | Freq: Once | ORAL | Status: AC
  Administered 2019-03-06: 10 mg via ORAL
  Filled 2019-03-06: qty 2

## 2019-03-06 MED ORDER — LACTATED RINGERS IV SOLN
INTRAVENOUS | Status: AC | PRN
  Administered 2019-03-06: 1000 mL via INTRAVENOUS

## 2019-03-06 MED ORDER — PROPOFOL 500 MG/50ML IV EMUL
INTRAVENOUS | Status: DC | PRN
  Administered 2019-03-06: 150 ug/kg/min via INTRAVENOUS

## 2019-03-06 MED ORDER — PROPOFOL 10 MG/ML IV BOLUS
INTRAVENOUS | Status: DC | PRN
  Administered 2019-03-06: 30 mg via INTRAVENOUS
  Administered 2019-03-06: 50 mg via INTRAVENOUS
  Administered 2019-03-06: 30 mg via INTRAVENOUS

## 2019-03-06 SURGICAL SUPPLY — 25 items

## 2019-03-06 NOTE — Anesthesia Postprocedure Evaluation (Signed)
Anesthesia Post Note  Patient: Stacey Velazquez  Procedure(s) Performed: ESOPHAGOGASTRODUODENOSCOPY (EGD) WITH PROPOFOL  (N/A ) COLONOSCOPY WITH PROPOFOL (N/A ) BIOPSY POLYPECTOMY     Patient location during evaluation: PACU Anesthesia Type: MAC Level of consciousness: awake and alert Pain management: pain level controlled Vital Signs Assessment: post-procedure vital signs reviewed and stable Respiratory status: spontaneous breathing, nonlabored ventilation, respiratory function stable and patient connected to nasal cannula oxygen Cardiovascular status: stable and blood pressure returned to baseline Postop Assessment: no apparent nausea or vomiting Anesthetic complications: no    Last Vitals:  Vitals:   03/06/19 1500 03/06/19 1505  BP: (!) 100/24 (!) 111/48  Pulse: 72 79  Resp: (!) 22 15  Temp:    SpO2: 100% 100%    Last Pain:  Vitals:   03/06/19 1505  TempSrc:   PainSc: 7                  Anmarie Fukushima S

## 2019-03-06 NOTE — Op Note (Signed)
[facility] Patient Name: Stacey Velazquez Procedure Date: 03/06/2019 MRN: [id number] Attending MD: Thornton Park MD, MD Date of Birth: 02/16/1945 CSN: [id number] Age: 74 Admit Type: Outpatient Procedure:                Upper GI endoscopy Indications:              Cirrhosis with suspected esophageal varices                           Autoimmune hepatitis previously on azathioprine                           -Diagnosed in 2006                           -Liver biopsy 2006: Grade 3, stage I autoimmune                            hepatitis                           -Previously on azathioprine and steroids                           -Last seen in the [facility] hepatology clinic 2014                           -Most recently treated by Dr. Benson Norway, last in 2018                           Esophageal varices on EGD 02/10/16 Benson Norway)                           Suspected cirrhosis Providers:                Thornton Park MD, MD, Glori Bickers, RN, Marguerita Merles, Technician, Theodora Blow, Technician Referring MD:              Medicines:                Monitored Anesthesia Care Complications:            No immediate complications. Estimated blood loss:                            Minimal. Estimated Blood Loss:     Estimated blood loss was minimal. Procedure:                Pre-Anesthesia Assessment:                           - Prior to the procedure, a History and Physical                            was performed, and patient medications and  allergies were reviewed. The patient's tolerance of                            previous anesthesia was also reviewed. The risks                            and benefits of the procedure and the sedation                            options and risks were discussed with the patient.                            All questions were answered, and informed consent                            was obtained. Prior  Anticoagulants: The patient has                            taken no previous anticoagulant or antiplatelet                            agents. ASA Grade Assessment: III - A patient with                            severe systemic disease. After reviewing the risks                            and benefits, the patient was deemed in                            satisfactory condition to undergo the procedure.                           After obtaining informed consent, the endoscope was                            passed under direct vision. Throughout the                            procedure, the patient's blood pressure, pulse, and                            oxygen saturations were monitored continuously. The                            GIF-H190 ([id number] Olympus gastroscope was                            introduced through the mouth, and advanced to the                            third part of duodenum. The upper GI endoscopy was  accomplished without difficulty. The patient                            tolerated the procedure well. Scope In: Scope Out: Findings:      Grade II varices were found in the middle third of the esophagus.      Moderate portal hypertensive gastropathy was found in the gastric body.      Localized mild inflammation characterized by erythema, friability and       granularity was found in the gastric fundus. Biopsies were taken from       the antrum, body, and fundus with a cold forceps for histology.       Estimated blood loss was minimal.      One non-bleeding cratered gastric ulcer with no stigmata of bleeding was       found on the lesser curvature of the stomach. The lesion was 5 mm in       largest dimension. No gastric varices.      The examined duodenum was normal.      The exam was otherwise without abnormality. Impression:               - Grade II esophageal varices.                           - Portal hypertensive gastropathy.                            - Gastritis. Biopsied.                           - Non-bleeding gastric ulcer with no stigmata of                            bleeding.                           - Normal examined duodenum.                           - The examination was otherwise normal. Moderate Sedation:      Not Applicable - Patient had care per Anesthesia. Recommendation:           - Patient has a contact number available for                            emergencies. The signs and symptoms of potential                            delayed complications were discussed with the                            patient. Return to normal activities tomorrow.                            Written discharge instructions were provided to the                            patient.                           -  Resume previous diet.                           - Continue present medications.                           - Pantoprazole 40 mg BID x 8 weeks.                           - Add nonselective beta-blocker such as Nadolol if                            there are no medical contraindications.                           - Await pathology results.                           - Avoid all NSAIDs.                           - Consider repeat EGD in 3 months to assess for                            healing of the gastric ulcer. Procedure Code(s):        --- Professional ---                           [phone], Esophagogastroduodenoscopy, flexible,                            transoral; with biopsy, single or multiple Diagnosis Code(s):        --- Professional ---                           K74.60, Unspecified cirrhosis of liver                           I85.10, Secondary esophageal varices without                            bleeding                           K76.6, Portal hypertension                           K31.89, Other diseases of stomach and duodenum                           K29.70, Gastritis, unspecified, without bleeding                            K25.9, Gastric ulcer, unspecified as acute or                            chronic, without hemorrhage or perforation CPT copyright 2019 American  Medical Association. All rights reserved. The codes documented in this report are preliminary and upon coder review may  be revised to meet current compliance requirements. Thornton Park MD, MD 03/06/2019 2:43:34 PM This report has been signed electronically. Number of Addenda: 0

## 2019-03-06 NOTE — Interval H&P Note (Signed)
History and Physical Interval Note:  03/06/2019 1:25 PM  Stacey Velazquez  has presented today for surgery, with the diagnosis of rectal bleeding, hx of colon polyps, autoimmune hepatitis, esophageal varices.  The various methods of treatment have been discussed with the patient and family. After consideration of risks, benefits and other options for treatment, the patient has consented to  Procedure(s): ESOPHAGOGASTRODUODENOSCOPY (EGD) WITH PROPOFOL  (N/A) COLONOSCOPY WITH PROPOFOL (N/A) as a surgical intervention.  The patient's history has been reviewed, patient examined, no change in status, stable for surgery.  I have reviewed the patient's chart and labs.  Questions were answered to the patient's satisfaction.     Thornton Park

## 2019-03-06 NOTE — Transfer of Care (Signed)
Immediate Anesthesia Transfer of Care Note  Patient: Stacey Velazquez  Procedure(s) Performed: Procedure(s): ESOPHAGOGASTRODUODENOSCOPY (EGD) WITH PROPOFOL  (N/A) COLONOSCOPY WITH PROPOFOL (N/A) BIOPSY POLYPECTOMY  Patient Location: PACU  Anesthesia Type:MAC  Level of Consciousness: Patient easily awoken, sedated, comfortable, cooperative, following commands, responds to stimulation.   Airway & Oxygen Therapy: Patient spontaneously breathing, ventilating well, oxygen via simple oxygen mask.  Post-op Assessment: Report given to PACU RN, vital signs reviewed and stable, moving all extremities.   Post vital signs: Reviewed and stable.  Complications: No apparent anesthesia complications Last Vitals:  Vitals Value Taken Time  BP 112/67 03/06/19 1441  Temp 36.9 C 03/06/19 1441  Pulse 74 03/06/19 1444  Resp 19 03/06/19 1444  SpO2 100 % 03/06/19 1444  Vitals shown include unvalidated device data.  Last Pain:  Vitals:   03/06/19 1441  TempSrc: Temporal  PainSc: 0-No pain         Complications: No apparent anesthesia complications

## 2019-03-06 NOTE — Anesthesia Procedure Notes (Signed)
Procedure Name: MAC Date/Time: 03/06/2019 1:52 PM Performed by: Deliah Boston, CRNA Pre-anesthesia Checklist: Patient identified, Emergency Drugs available, Suction available and Patient being monitored Patient Re-evaluated:Patient Re-evaluated prior to induction Oxygen Delivery Method: Simple face mask Preoxygenation: Pre-oxygenation with 100% oxygen Placement Confirmation: positive ETCO2

## 2019-03-06 NOTE — Plan of Care (Signed)
  Problem: Education: Goal: Knowledge of General Education information will improve Description: Including pain rating scale, medication(s)/side effects and non-pharmacologic comfort measures Outcome: Progressing   Problem: Health Behavior/Discharge Planning: Goal: Ability to manage health-related needs will improve Outcome: Progressing   Problem: Clinical Measurements: Goal: Ability to maintain clinical measurements within normal limits will improve Outcome: Progressing Goal: Will remain free from infection Outcome: Progressing Goal: Respiratory complications will improve Outcome: Progressing Goal: Cardiovascular complication will be avoided Outcome: Progressing   Problem: Elimination: Goal: Will not experience complications related to bowel motility Outcome: Progressing Goal: Will not experience complications related to urinary retention Outcome: Progressing   Problem: Pain Managment: Goal: General experience of comfort will improve Outcome: Progressing   Problem: Safety: Goal: Ability to remain free from injury will improve Outcome: Progressing   Problem: Skin Integrity: Goal: Risk for impaired skin integrity will decrease Outcome: Progressing   Problem: Clinical Measurements: Goal: Diagnostic test results will improve Outcome: Not Progressing   Problem: Activity: Goal: Risk for activity intolerance will decrease Outcome: Not Progressing   Problem: Nutrition: Goal: Adequate nutrition will be maintained Outcome: Not Progressing   Problem: Coping: Goal: Level of anxiety will decrease Outcome: Not Progressing

## 2019-03-06 NOTE — Anesthesia Preprocedure Evaluation (Signed)
Anesthesia Evaluation  Patient identified by MRN, date of birth, ID band Patient awake    Reviewed: Allergy & Precautions, NPO status , Patient's Chart, lab work & pertinent test results  Airway Mallampati: II  TM Distance: >3 FB Neck ROM: Full    Dental no notable dental hx.    Pulmonary neg pulmonary ROS, former smoker,    Pulmonary exam normal breath sounds clear to auscultation       Cardiovascular Normal cardiovascular exam+ pacemaker  Rhythm:Regular Rate:Normal     Neuro/Psych Schizophrenia CVA    GI/Hepatic negative GI ROS, (+) Hepatitis -, Autoimmune  Endo/Other  negative endocrine ROS  Renal/GU negative Renal ROS  negative genitourinary   Musculoskeletal negative musculoskeletal ROS (+)   Abdominal   Peds negative pediatric ROS (+)  Hematology negative hematology ROS (+)   Anesthesia Other Findings   Reproductive/Obstetrics negative OB ROS                             Anesthesia Physical Anesthesia Plan  ASA: III  Anesthesia Plan: MAC   Post-op Pain Management:    Induction: Intravenous  PONV Risk Score and Plan: 0  Airway Management Planned: Simple Face Mask  Additional Equipment:   Intra-op Plan:   Post-operative Plan:   Informed Consent: I have reviewed the patients History and Physical, chart, labs and discussed the procedure including the risks, benefits and alternatives for the proposed anesthesia with the patient or authorized representative who has indicated his/her understanding and acceptance.     Dental advisory given  Plan Discussed with: CRNA and Surgeon  Anesthesia Plan Comments:         Anesthesia Quick Evaluation

## 2019-03-06 NOTE — Discharge Summary (Signed)
[facility] Discharge Summary  Name: TAURIEL SCRONCE MRN: [id number] DOB: 15-Nov-1945 74 y.o. PCP:  Rocco Serene, MD  Date of Admission: 03/05/2019 11:22 AM Date of Discharge: 03/06/2019 Attending Physician: Thornton Park, MD  Discharge Diagnosis: Active Problems:   Rectal bleeding   Gastric ulcer   Rectal polyp   Adenomatous polyp of ascending colon   Consultations: none  Procedures Performed:  US Abdomen Complete  Result Date: 03/05/2019 CLINICAL DATA:  Autoimmune hepatitis EXAM: ABDOMEN ULTRASOUND COMPLETE COMPARISON:  02/19/2015 FINDINGS: Gallbladder: Surgically removed Common bile duct: Diameter: 5.5 mm. Liver: Diffusely heterogeneous similar to that noted on the prior exam. Nodularity of the hepatic contour is noted suggestive of cirrhosis. No definitive mass is seen. Portal vein is patent on color Doppler imaging with normal direction of blood flow towards the liver. IVC: No abnormality visualized. Pancreas: Visualized portion unremarkable. Spleen: Mildly enlarged measuring 14.6 cm in greatest dimension. Calculated volume is 446 mL. Right Kidney: Length: 12.5 cm. Echogenicity within normal limits. No mass or hydronephrosis visualized. Left Kidney: Length: 11.6 cm. Cysts are noted within the left kidney. The largest of these measures 3.4 cm centrally in a parapelvic region. Abdominal aorta: No aneurysm visualized. Other findings: None. IMPRESSION: Changes consistent with cirrhosis of the liver. Status post cholecystectomy. Splenomegaly. Left renal cysts. Electronically Signed   By: Inez Catalina M.D.   On: 03/05/2019 16:02   History/Physical Exam:  See Admission H&P Reports no change in symptoms as compared to 6 days ago when she was seen by Dr. Tarri Glenn in our office.  Please see OV noted from 02/26/2018.  Admission HPI: KANAE Velazquez is a 74 y.o. female with past medical history significant for anxiety, autoimmune hepatitis with suspected cirrhosis, bipolar 1  disorder, fibromyalgia, GERD, hypothyroidism, history of seizures last in 2016.  Was seen in the office by Dr. Tarri Glenn on February 2 with complaints of rectal bleeding and change in stool caliber.  Has history of advanced colon polyps.  Is being admitted to [facility] for preparation of EGD and colonoscopy on February 9 due to transportation issues.  GI Procedures:  Colonoscopy 03/06/19 Impression: Hemorrhoids found on perianal exam. - One 20 mm polyp in the distal rectum. Worrisome for advance polyp and/or adenocarcinoma. Biopsied. - Eight 1 to 4 mm polyps in the sigmoid colon, in the descending colon, in the ascending colon and in the cecum, removed with a cold snare. Resected and retrieved. - The examination was otherwise normal on direct and retroflexion views.  Biopsies pending  EGD 03/06/19 Impression:  Grade II esophageal varices. - Portal hypertensive gastropathy. - Gastritis. Biopsied. - Non-bleeding gastric ulcer with no stigmata of bleeding. - Normal examined duodenum. - The examination was otherwise normal.  Biopsies pending  Discharge Vitals:  BP (!) 111/48   Pulse 79   Temp (!) 97.5 F (36.4 C) (Temporal)   Resp 15   Ht 5\' 9"  (1.753 m)   Wt 90.7 kg   SpO2 100%   BMI 29.53 kg/m   Discharge Labs: No results found for this or any previous visit (from the past 24 hour(s)).  Disposition and follow-up:   Ms.Amiyah B Wiles was discharged home from [facility] in stable condition.  No changes to home medications were made  Follow-up Appointments: Discharge Instructions    Diet - low sodium heart healthy   Complete by: As directed    Increase activity slowly   Complete by: As directed  Discharge Medications: Allergies as of 03/06/2019      Reactions   Ambien [zolpidem Tartrate] Other (See Comments)   Hallucinations   Amitriptyline Other (See Comments)   Stops heart per pt   Ampicillin Other (See Comments)   Stops heart per pt    Anaprox [naproxen Sodium] Other (See Comments)   Doesn't work per pt   Benadryl [diphenhydramine Hcl (sleep)] Other (See Comments)   Rash   Cetirizine & Related Other (See Comments)   Swelling   Cortizone-10 [hydrocortisone] Other (See Comments)   seizures   Darvon [propoxyphene] Other (See Comments)   Increased heart rate per pt   Diazepam Other (See Comments)   Stops heart per pt   Diflunisal Swelling, Other (See Comments)   Stops heart per pt (Dolobid)   Duloxetine Other (See Comments)   Reaction to Cymbalta - pt doesn't remember what the reaction was   Flexeril [cyclobenzaprine] Other (See Comments)   Seizures   Lactose Intolerance (gi) Swelling, Other (See Comments)   cramping   Lidocaine Other (See Comments)   Seizures   Meperidine And Related Other (See Comments)   Stops heart rate (reaction to Demerol)   Metanx [l-methylfolate-algae-b12-b6] Other (See Comments)   Pt does not remember reaction   Metoclopramide Other (See Comments)   Pt does not remember reaction   Morphine And Related Other (See Comments)   Stops heart per pt   Nuprin [ibuprofen] Other (See Comments)   Caused headache   Oxycodone Other (See Comments)   Hallucinations   Penicillins Other (See Comments)   Stops heart per pt   Propoxyphene Other (See Comments)   Slowed heart rate per pt (reaction to Darvocet)   Ranitidine Other (See Comments)   Pt does not remember reaction   Vicodin [hydrocodone-acetaminophen] Other (See Comments)   Seizures   Tylenol [acetaminophen] Rash      Medication List    TAKE these medications   levothyroxine 50 MCG tablet Commonly known as: SYNTHROID Take 50 mcg by mouth daily before breakfast.   potassium chloride 10 MEQ tablet Commonly known as: KLOR-CON Take 2 tablets (20 mEq total) by mouth 2 (two) times daily for 3 days.   traMADol 50 MG tablet Commonly known as: ULTRAM Take 50 mg by mouth every 4 (four) hours as needed (for pain.).        Signed: Tye Savoy 03/06/2019, 5:15 PM

## 2019-03-06 NOTE — Progress Notes (Signed)
PIV consult: Pt gone for procedure. RN will re-enter consult if needed.

## 2019-03-06 NOTE — Op Note (Signed)
[facility] Patient Name: Stacey Velazquez Procedure Date: 03/06/2019 MRN: [id number] Attending MD: Thornton Park MD, MD Date of Birth: March 09, 1945 CSN: [id number] Age: 74 Admit Type: Outpatient Procedure:                Colonoscopy Indications:              High risk colon cancer surveillance: Personal                            history of adenoma (10 mm or greater in size)                           Rectal bleeding with change in stool caliber x 1                            year                           History of advanced colon polyps                           -Multiple colonoscopies over the years with both                            tubular adenomas and tubulovillous adenomas                           -Colonoscopy 2018 at [facility]: 15mm polyp in the                            ascending and at the hepatic flexure                           -Surveillance colonoscopy recommended in 1 years Providers:                Thornton Park MD, MD, Glori Bickers, RN, Theodora Blow, Technician, Marguerita Merles, Technician Referring MD:              Medicines:                Monitored Anesthesia Care Complications:            No immediate complications. Estimated blood loss:                            Minimal. Estimated Blood Loss:     Estimated blood loss was minimal. Procedure:                Pre-Anesthesia Assessment:                           - Prior to the procedure, a History and Physical                            was performed, and patient medications and  allergies were reviewed. The patient's tolerance of                            previous anesthesia was also reviewed. The risks                            and benefits of the procedure and the sedation                            options and risks were discussed with the patient.                            All questions were answered, and informed consent        was obtained. Prior Anticoagulants: The patient has                            taken no previous anticoagulant or antiplatelet                            agents. ASA Grade Assessment: III - A patient with                            severe systemic disease. After reviewing the risks                            and benefits, the patient was deemed in                            satisfactory condition to undergo the procedure.                           After obtaining informed consent, the colonoscope                            was passed under direct vision. Throughout the                            procedure, the patient's blood pressure, pulse, and                            oxygen saturations were monitored continuously. The                            CF-HQ190L ([id number]) Olympus colonoscope was                            introduced through the anus and advanced to the 3                            cm into the ileum. A second forward view of the                            right colon was performed. The colonoscopy  was                            performed without difficulty. The patient tolerated                            the procedure well. The quality of the bowel                            preparation was good. The terminal ileum, ileocecal                            valve, appendiceal orifice, and rectum were                            photographed. Scope In: 2:07:48 PM Scope Out: 2:33:45 PM Scope Withdrawal Time: 0 hours 19 minutes 2 seconds  Total Procedure Duration: 0 hours 25 minutes 57 seconds  Findings:      Hemorrhoids were found on perianal exam.      A 20 mm polyp was found in the distal rectum. The polyp was       semi-pedunculated and will fall into the anal canal. The base of the       polyps abutts the dentate line. The overlying mucosa of the more       proximal portion of the polyp appeared ulcerated and is worrisome for an       advanced polyp if not adenocarcinoma.  Multiple biopsies were taken with       a cold forceps for histology. Estimated blood loss was minimal.      Eight sessile polyps were found in the sigmoid colon (2), descending       colon (2), ascending colon (2) and cecum (2). One of the polyps in the       sigmoid colon was near a tattoo. The polyps were 1 to 4 mm in size.       These polyps were removed with a cold snare. Resection and retrieval       were complete. Estimated blood loss was minimal.      One tattoo was present in the sigmoid colon. No other tattoo was seen.       Non-bleeding internal hemorrhoids were found during retroflexion. The       hemorrhoids were small. Impression:               - Hemorrhoids found on perianal exam.                           - One 20 mm polyp in the distal rectum. Worrisome                            for advance polyp and/or adenocarcinoma. Biopsied.                           - Eight 1 to 4 mm polyps in the sigmoid colon, in                            the descending colon, in the ascending colon and in  the cecum, removed with a cold snare. Resected and                            retrieved.                           - The examination was otherwise normal on direct                            and retroflexion views. Moderate Sedation:      Not Applicable - Patient had care per Anesthesia. Recommendation:           - Patient has a contact number available for                            emergencies. The signs and symptoms of potential                            delayed complications were discussed with the                            patient. Return to normal activities tomorrow.                            Written discharge instructions were provided to the                            patient.                           - Resume previous diet.                           - Continue present medications.                           - Await pathology results.                            - Repeat colonoscopy date to be determined after                            pending pathology results are reviewed for                            surveillance. Procedure Code(s):        --- Professional ---                           [phone], Colonoscopy, flexible; with removal of                            tumor(s), polyp(s), or other lesion(s) by snare                            technique  35825, 52, Colonoscopy, flexible; with biopsy,                            single or multiple Diagnosis Code(s):        --- Professional ---                           Z86.010, Personal history of colonic polyps                           K62.1, Rectal polyp                           K63.5, Polyp of colon                           K64.9, Unspecified hemorrhoids CPT copyright 2019 American Medical Association. All rights reserved. The codes documented in this report are preliminary and upon coder review may  be revised to meet current compliance requirements. Thornton Park MD, MD 03/06/2019 2:54:05 PM This report has been signed electronically. Number of Addenda: 0

## 2019-03-07 ENCOUNTER — Telehealth: Payer: Self-pay | Admitting: Gastroenterology

## 2019-03-07 ENCOUNTER — Other Ambulatory Visit: Payer: Self-pay

## 2019-03-07 LAB — SURGICAL PATHOLOGY

## 2019-03-07 NOTE — Telephone Encounter (Signed)
I tried to return his call. He was not available.

## 2019-03-07 NOTE — Telephone Encounter (Signed)
Rhonda from [facility] stated that Dr. Saralyn Pilar would like to discuss with Dr. Tarri Glenn regarding this patient.

## 2019-03-08 ENCOUNTER — Encounter: Payer: Self-pay | Admitting: *Deleted

## 2019-03-08 ENCOUNTER — Telehealth: Payer: Self-pay | Admitting: Gastroenterology

## 2019-03-08 NOTE — Telephone Encounter (Signed)
Spoke to patient and scheduled follow up post ECL.

## 2019-03-13 ENCOUNTER — Other Ambulatory Visit: Payer: Self-pay

## 2019-03-13 ENCOUNTER — Ambulatory Visit (INDEPENDENT_AMBULATORY_CARE_PROVIDER_SITE_OTHER): Payer: Medicare Other | Admitting: Sports Medicine

## 2019-03-13 ENCOUNTER — Encounter: Payer: Self-pay | Admitting: Sports Medicine

## 2019-03-13 VITALS — BP 106/55 | HR 83 | Temp 97.7°F | Resp 16

## 2019-03-13 DIAGNOSIS — M79675 Pain in left toe(s): Secondary | ICD-10-CM

## 2019-03-13 DIAGNOSIS — R443 Hallucinations, unspecified: Secondary | ICD-10-CM | POA: Insufficient documentation

## 2019-03-13 DIAGNOSIS — B351 Tinea unguium: Secondary | ICD-10-CM | POA: Diagnosis not present

## 2019-03-13 DIAGNOSIS — I739 Peripheral vascular disease, unspecified: Secondary | ICD-10-CM

## 2019-03-13 DIAGNOSIS — M79674 Pain in right toe(s): Secondary | ICD-10-CM | POA: Diagnosis not present

## 2019-03-13 DIAGNOSIS — L03032 Cellulitis of left toe: Secondary | ICD-10-CM

## 2019-03-13 NOTE — Progress Notes (Signed)
Subjective: Stacey Velazquez is a 74 y.o. female patient seen today in office with complaint of mildly painful thickened and elongated toenails; unable to trim. Patient denies history of Diabetes but does admit to a history of hear and back problems unable to bend to trim nails. Patient has no other pedal complaints at this time.   Patient Active Problem List   Diagnosis Date Noted  . Hallucinations 03/13/2019  . Gastric ulcer   . Rectal polyp   . Adenomatous polyp of ascending colon   . Rectal bleeding 03/05/2019  . Vertigo   . Hypotension   . Heart disease   . GERD (gastroesophageal reflux disease)   . Autoimmune hepatitis ([place])   . Anxiety   . Low back pain 10/18/2016  . Gingival abscess 08/26/2016  . Acute right hip pain 08/26/2016  . Seizure disorder ([place]) 10/14/2014  . Drug-induced Parkinson's disease ([place]) 10/14/2014  . Hypothyroidism 09/09/2014  . Azathioprine-induced leukopenia ([place]) 08/25/2014  . Thrombocytopenia due to drugs 08/25/2014  . Hepatitis, autoimmune ([place]) 08/25/2014  . Syncope   . Seizures ([place])   . Sinus arrest   . Pacemaker Medtronic   . Bipolar 1 disorder ([place])   . Paranoid schizophrenia ([place]) 03/25/2012  . Glaucoma 01/25/2001    Current Outpatient Medications on File Prior to Visit  Medication Sig Dispense Refill  . levothyroxine (SYNTHROID) 50 MCG tablet Take 50 mcg by mouth daily before breakfast.    . traMADol (ULTRAM) 50 MG tablet Take 50 mg by mouth every 4 (four) hours as needed (for pain.).     Marland Kitchen potassium chloride (KLOR-CON) 10 MEQ tablet Take 2 tablets (20 mEq total) by mouth 2 (two) times daily for 3 days. 12 tablet 0   No current facility-administered medications on file prior to visit.    Allergies  Allergen Reactions  . Ambien [Zolpidem Tartrate] Other (See Comments)    Hallucinations   . Amitriptyline Other (See Comments)    Stops heart per pt  . Ampicillin Other (See Comments)    Stops heart per pt   . Anaprox [Naproxen  Sodium] Other (See Comments)    Doesn't work per pt  . Benadryl [Diphenhydramine Hcl (Sleep)] Other (See Comments)    Rash  . Cetirizine & Related Other (See Comments)    Swelling   . Cortizone-10 [Hydrocortisone] Other (See Comments)    seizures   . Darvon [Propoxyphene] Other (See Comments)    Increased heart rate per pt   . Diazepam Other (See Comments)    Stops heart per pt  . Diflunisal Swelling and Other (See Comments)    Stops heart per pt (Dolobid)  . Duloxetine Other (See Comments)    Reaction to Cymbalta - pt doesn't remember what the reaction was  . Flexeril [Cyclobenzaprine] Other (See Comments)    Seizures   . Lactose Intolerance (Gi) Swelling and Other (See Comments)    cramping  . Lidocaine Other (See Comments)    Seizures  . Meperidine And Related Other (See Comments)    Stops heart rate (reaction to Demerol)  . Metanx [L-Methylfolate-Algae-B12-B6] Other (See Comments)    Pt does not remember reaction  . Metoclopramide Other (See Comments)    Pt does not remember reaction  . Morphine And Related Other (See Comments)    Stops heart per pt   . Nuprin [Ibuprofen] Other (See Comments)    Caused headache  . Oxycodone Other (See Comments)    Hallucinations   . Penicillins Other (See  Comments)    Stops heart per pt   . Propoxyphene Other (See Comments)    Slowed heart rate per pt (reaction to Darvocet)  . Ranitidine Other (See Comments)    Pt does not remember reaction  . Vicodin [Hydrocodone-Acetaminophen] Other (See Comments)    Seizures   . Tylenol [Acetaminophen] Rash    Objective: Physical Exam  General: Well developed, nourished, no acute distress, awake, alert and oriented x 3  Vascular: Dorsalis pedis artery 1/4 bilateral, Posterior tibial artery 0/4 bilateral, skin temperature warm to warm proximal to distal bilateral lower extremities, + varicosities, No pedal hair present bilateral. +1 pitting edema and purple hue to legs bilateral.    Neurological: Gross sensation present via light touch bilateral.   Dermatological: Skin is warm, dry, and supple bilateral, Nails 1-10 are tender, long, thick, and discolored with mild subungal debris, Blanchable erythema at left 1st toenail bed at proximal nail fold, no webspace macerations present bilateral, no open lesions present bilateral, no callus/corns/hyperkeratotic tissue present bilateral. No signs of infection bilateral.  Musculoskeletal: No symptomatic boney deformities noted bilateral. Muscular strength 4/5 without painon range of motion. No pain with calf compression bilateral.  Assessment and Plan:  Problem List Items Addressed This Visit    None    Visit Diagnoses    Pain due to onychomycosis of toenails of both feet    -  Primary   Paronychia of great toe, left       PVD (peripheral vascular disease) ([place])          -Examined patient.  -Discussed treatment options for painful mycotic nails. -ABN signed and copy provided to patient  -Mechanically debrided and reduced mycotic nails with sterile nail nipper and dremel nail file without incident. -There was a small amount of redness at nail fold on left 1st toe, advised patient to soak with warm water and epsom salt and apply antibiotic cream for 1 week thenafter may use vicks vapor rub to thick nails -Patient to return in 3 months for follow up evaluation or sooner if symptoms worsen.  Landis Martins, DPM

## 2019-03-13 NOTE — Patient Instructions (Signed)
Soak with Epsom salt and warm water daily for the next week to help the left 1st toe heal May try vicks vapor rub to nails daily for fungus

## 2019-03-16 ENCOUNTER — Other Ambulatory Visit: Payer: Self-pay | Admitting: *Deleted

## 2019-03-16 NOTE — Patient Outreach (Signed)
[facility]) Care Management  03/16/2019  Stacey Velazquez 11/17/1945 [id number]   Subjective: Telephone call to patient's home number, spoke with patient, and HIPAA verified.  Discussed [facility] Care Management Medicare [place] referral follow up, patient voiced understanding, and is in agreement to follow up at a later time.  Patient states she has severe arthritis, having some problems, does not feel like talking, is currently in the bed resting, and requested call back at a later time.    Objective: Per KPN (Knowledge [employer] Now, point of care tool) and chart review, patient hospitalized 03/05/2019 - 03/06/2019 for rectal bleeding, Gastric ulcer, Rectal polyp, Adenomatous polyp of ascending colon, was admitted to [facility] for preparation of EGD and colonoscopy on February 9 due to transportation issues.  Patient also has a history of anxiety, autoimmune hepatitis withsuspectedcirrhosis, bipolar 1 disorder, fibromyalgia, GERD, hypothyroidism, history of seizures last in 2016, Drug-induced Parkinsonism, Glaucoma, Heart attack, Pacemaker Medtronic, Paranoid schizophrenia,Polyarthritis, and stroke.     Assessment: Received Medicare [place] follow up referral on 03/13/2019.  Referral sources: [place].  Referral reason: care management/social work outreach. Medical conditions: internal bleeding, heart condition, hepatitis, arthritis.    Screening  follow up pending patient contact.     Plan: RNCM will send unsuccessful outreach  letter, [facility] pamphlet, will call patient for 2nd telephone outreach attempt within 4 business days, screening follow up, and will proceed with case closure within 10 business days if no return call.     Layne Dilauro H. Annia Friendly, BSN, [place] [facility] Telephonic CM Phone: [phone] Fax: [phone]

## 2019-03-20 ENCOUNTER — Other Ambulatory Visit: Payer: Self-pay | Admitting: *Deleted

## 2019-03-20 NOTE — Patient Outreach (Addendum)
[facility]) Care Management  03/20/2019  Stacey Velazquez [birth year] [id number]   Subjective: Telephone call to patient's home / mobile number, spoke with patient, and HIPAA verified.  Discussed [facility] Care Management Medicare [place] referral follow up, patient voiced understanding, and is in agreement to follow up.  Patient states she remembers speaking with this RNCM in the past, today  is feeling much better, and arthritis pain is manageable.  States she has arthritis in several joints, it intefers with her mobility at times, and she adjust her activities of daily living as needed.  States she was admitted on 03/06/2019 - 03/07/2019 due to transportation issue for colonoscopy, has had rectal bleeding for over a year.  Patient states she has missed appointments in the past due to transportation issues, transportation to all of her medical appointments is being provided through the [facility] transportation department, and no longer has an issue.   States spoke with her gastroenterologist this morning, received the results of her colonoscopy, and has been referred to a surgeon for follow up.  States she has the follow up appointment with surgeon on 03/28/2019 and with primary MD on 03/27/2019.  States she is in need of another walker, current walker was given to her by a friend, it is broken, and can no longer be used.   Patient states she has a friend that may give her a family member's walker that is no longer being used and may not need a new walker.  Patient is planning to follow up with primary MD regarding a new walker with a seat (rollator), if friend's walker does not work for her,  and will notify this RNCM if assistance needed.  Patient states she is able to manage self care, prepares meals in microwave, and has some assistance as needed. States she has a Research officer, trade union system in her bedroom, has a very small apartment, and  lives in a senior living Housing and Teacher, music ([place])  community.  States she needs some assistance with housekeeping (cleaning and laundry) at times due to mobility issues, is in agreement to a referral to [place] Social Worker for housekeeping / Education administrator.  Patient voices understanding of medical diagnosis and treatment plan.  States she has had a heart attack and stroke in the past.   States she is in the process of applying for Medicaid, has had difficulty completing the application, in need of assistance with completing the application, and is in agreement to a referral to [place] Social Worker for [employer] application assistance.  States she is accessing her Medicare benefits as needed via member services number on back of card.   Patient states she also has Extra Help benefits.  Discussed Advanced Directives, advised of [place] Management Social Worker  Advanced Directives document completion benefit, patient voices understanding, and in agreement to a referral to Education officer, museum will to ask Social Worker to send [employer], and follow up on document completion.    Patient states she does not have any education material, transition of care, care coordination, disease management, disease monitoring, transportation, or pharmacy needs at this time.   States she is very appreciative of the follow up.   States she is in agreement to receive [place] Management services and to receive 1 additional follow up call to assess for further CM needs.     Objective: Per KPN (Knowledge Performance Now, point of care tool) and chart review,  patient hospitalized 03/05/2019 - 03/06/2019 for rectal bleeding, Gastric ulcer, Rectal polyp, Adenomatous polyp of ascending colon, was admitted to [facility] for preparation of EGD and colonoscopy on February 9 due to transportation issues.  Patient also has a history of anxiety, autoimmune hepatitis withsuspectedcirrhosis, bipolar 1 disorder,  fibromyalgia, GERD, hypothyroidism, history of seizures last in 2016, Drug-induced Parkinsonism, Glaucoma, Heart attack, Pacemaker Medtronic, Paranoid schizophrenia,Polyarthritis, and stroke.     Assessment: Received Medicare [place] follow up referral on 03/13/2019.  Referral sources: [place].  Referral reason: care management/social work outreach. Medical conditions: internal bleeding, heart condition, hepatitis, arthritis.    Screening  follow up completed.  Will refer patient to [place] Management Social Worker for the following: Medicaid application, housekeeping resources, and Advanced Directives documents completion.      Plan: RNCM will refer patient to [place] Management Social Worker for the following: Medicaid application assistance, housekeeping / Education administrator, and Advanced Directives document completion.   RNCM has sent unsuccessful outreach  letter, [facility] pamphlet, will call patient for 2nd telephone outreach attempt within 21 business days, assess for CM needs, and will proceed with case closure within 10 business days if no return call.     Britney Captain H. Annia Friendly, BSN, [place] Management [facility] Telephonic CM Phone: [phone] Fax: [phone]

## 2019-03-23 ENCOUNTER — Encounter: Payer: Self-pay | Admitting: *Deleted

## 2019-03-23 ENCOUNTER — Other Ambulatory Visit: Payer: Self-pay | Admitting: *Deleted

## 2019-03-23 DIAGNOSIS — Z008 Encounter for other general examination: Secondary | ICD-10-CM | POA: Diagnosis not present

## 2019-03-23 DIAGNOSIS — M130 Polyarthritis, unspecified: Secondary | ICD-10-CM | POA: Diagnosis not present

## 2019-03-23 DIAGNOSIS — K219 Gastro-esophageal reflux disease without esophagitis: Secondary | ICD-10-CM | POA: Diagnosis not present

## 2019-03-23 DIAGNOSIS — K635 Polyp of colon: Secondary | ICD-10-CM | POA: Diagnosis not present

## 2019-03-23 NOTE — Patient Outreach (Signed)
[facility]) Care Management  03/23/2019  Stacey Velazquez 1945/04/14 [id number]   CSW was able to make initial contact with patient today to perform phone assessment, as well as assess and assist with social work needs and services.  CSW introduced self, explained role and types of services provided through [place] Management ([place] Management).  CSW further explained to patient that CSW works with patient's Telephonic RNCM, also with [place] Management, Sonda Rumble.  CSW then explained the reason for the call, indicating that Mrs. Burt Knack thought that patient would benefit from social work services and resources to assist with completion of an Adult Medicaid application, assistance with obtaining in-home or personal care services and completion of Advanced Directives.  CSW obtained two HIPAA compliant identifiers from patient, which included patient's name and date of birth.  Patient reported that she is already aware that she will not qualify for Adult Medicaid, through the [place], having already recently applied and denied, due to exceeding the income guidelines for the [place] for physical year 2021.  Unfortunately, with that being said, CSW explained to patient that she would not be able to apply for in-home care assistance through [employer] Forensic scientist), with the [place], or [employer] ([place]), with [employer] [place], a Division of the [place], as she would need to be an active Medicaid recipient.  Patient voiced understanding, agreeing to pay for services out-of-pocket, if within reason.  Patient admitted that she is definitely interested in completing her Advanced Directives ([place] documents), requesting that [place]  mail her an Engineer, mining.  CSW agreed to place all of the following resource information in the mail to patient today:  Advanced Directives (Living Will and [place] documents); In [place]; [place]; Resources for Seniors CSW then agreed to follow-up with patient in 7 business days, on Tuesday, April 03, 2019, around 11:00am, to ensure that she received the packet of resource information, assist with completion of Advanced Directives, answer any questions she may have pertaining to the information received, as well as assist with the referral process.    Patient currently resides in [employer] for senior citizens and receives transportation assistance through [employer], to and from all [employer] affiliated facilities, or via her private pay caregiver or volunteer, transporting her to the grocery store, pharmacy, to run errands, etc.  Patient reported that she has not driven since 7616, due to the severity of her seizures.  Fortunately, patient has not had a seizure since 2016, but still does not trust herself behind the wheel.  Patient receives Extra Help to assist with medication expenses and has a [place], in the event of a medical emergency.  Patient was unable to identify any additional social work needs at present.  CSW was able to confirm that patient has the correct contact information for CSW, encouraging patient to contact CSW if assistance is needed in the meantime.     [name], BSW, MSW, LCSW  Licensed Education officer, environmental [facility] System  [place], [place], [place] [address] Physical Address-[address], [place], [place] [address] Toll Free Main # [phone] Fax # [phone] Cell # [phone]  Office # [phone]  [email or web address]

## 2019-03-24 ENCOUNTER — Emergency Department (HOSPITAL_COMMUNITY): Payer: Medicare Other

## 2019-03-24 ENCOUNTER — Emergency Department (HOSPITAL_COMMUNITY)
Admission: EM | Admit: 2019-03-24 | Discharge: 2019-03-25 | Disposition: A | Discharge status: Home / Self Care | Payer: Medicare Other | Attending: Emergency Medicine | Admitting: Emergency Medicine

## 2019-03-24 ENCOUNTER — Encounter (HOSPITAL_COMMUNITY): Payer: Self-pay | Admitting: *Deleted

## 2019-03-24 ENCOUNTER — Encounter: Payer: Self-pay | Admitting: *Deleted

## 2019-03-24 ENCOUNTER — Other Ambulatory Visit: Payer: Self-pay

## 2019-03-24 DIAGNOSIS — E039 Hypothyroidism, unspecified: Secondary | ICD-10-CM | POA: Insufficient documentation

## 2019-03-24 DIAGNOSIS — M79603 Pain in arm, unspecified: Secondary | ICD-10-CM | POA: Diagnosis not present

## 2019-03-24 DIAGNOSIS — M25521 Pain in right elbow: Secondary | ICD-10-CM | POA: Diagnosis not present

## 2019-03-24 DIAGNOSIS — Z79899 Other long term (current) drug therapy: Secondary | ICD-10-CM | POA: Insufficient documentation

## 2019-03-24 DIAGNOSIS — R52 Pain, unspecified: Secondary | ICD-10-CM | POA: Diagnosis not present

## 2019-03-24 DIAGNOSIS — Z950 Presence of cardiac pacemaker: Secondary | ICD-10-CM | POA: Insufficient documentation

## 2019-03-24 DIAGNOSIS — Z87891 Personal history of nicotine dependence: Secondary | ICD-10-CM | POA: Diagnosis not present

## 2019-03-24 LAB — COMPREHENSIVE METABOLIC PANEL
ALT: 55 U/L — ABNORMAL HIGH (ref 0–44)
AST: 67 U/L — ABNORMAL HIGH (ref 15–41)
Albumin: 2.4 g/dL — ABNORMAL LOW (ref 3.5–5.0)
Alkaline Phosphatase: 77 U/L (ref 38–126)
Anion gap: 7 (ref 5–15)
BUN: 10 mg/dL (ref 8–23)
CO2: 22 mmol/L (ref 22–32)
Calcium: 7.9 mg/dL — ABNORMAL LOW (ref 8.9–10.3)
Chloride: 104 mmol/L (ref 98–111)
Creatinine, Ser: 0.73 mg/dL (ref 0.44–1.00)
GFR calc Af Amer: >60 mL/min (ref >60)
GFR calc non Af Amer: >60 mL/min (ref >60)
Glucose, Bld: 110 mg/dL — ABNORMAL HIGH (ref 70–99)
Potassium: 3.8 mmol/L (ref 3.5–5.1)
Sodium: 133 mmol/L — ABNORMAL LOW (ref 135–145)
Total Bilirubin: 1.1 mg/dL (ref 0.3–1.2)
Total Protein: 7.5 g/dL (ref 6.5–8.1)

## 2019-03-24 LAB — CBC
HCT: 39.7 % (ref 36.0–46.0)
Hemoglobin: 13.4 g/dL (ref 12.0–15.0)
MCH: 31.9 pg (ref 26.0–34.0)
MCHC: 33.8 g/dL (ref 30.0–36.0)
MCV: 94.5 fL (ref 80.0–100.0)
Platelets: 78 10*3/uL — ABNORMAL LOW (ref 150–400)
RBC: 4.2 MIL/uL (ref 3.87–5.11)
RDW: 13.8 % (ref 11.5–15.5)
WBC: 4.4 10*3/uL (ref 4.0–10.5)
nRBC: 0 % (ref 0.0–0.2)

## 2019-03-24 LAB — LIPASE, BLOOD: Lipase: 21 U/L (ref 11–51)

## 2019-03-24 IMAGING — DX DG ELBOW COMPLETE 3+V*R*
4 series · 4 of 4 positions shown · non-contrast
Comparison: None.

CLINICAL DATA: Elbow pain limited range of motion

EXAM:
RIGHT ELBOW - COMPLETE 3+ VIEW

[x elbow obl right]
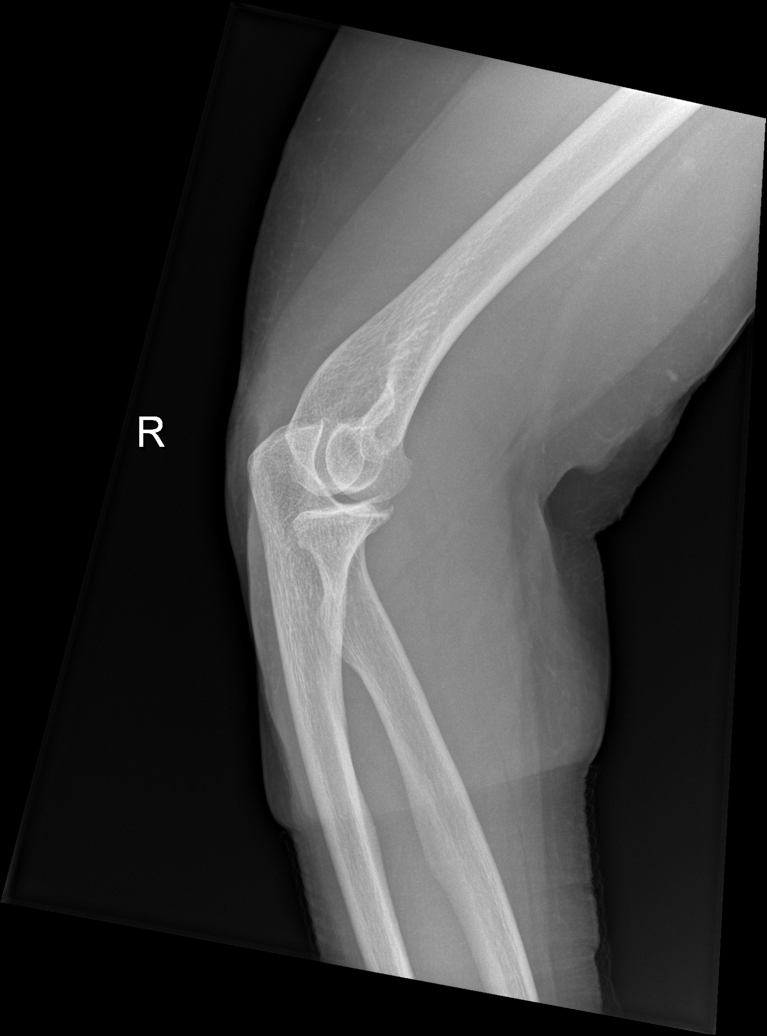

[x elbow ap right (1 of 2)]
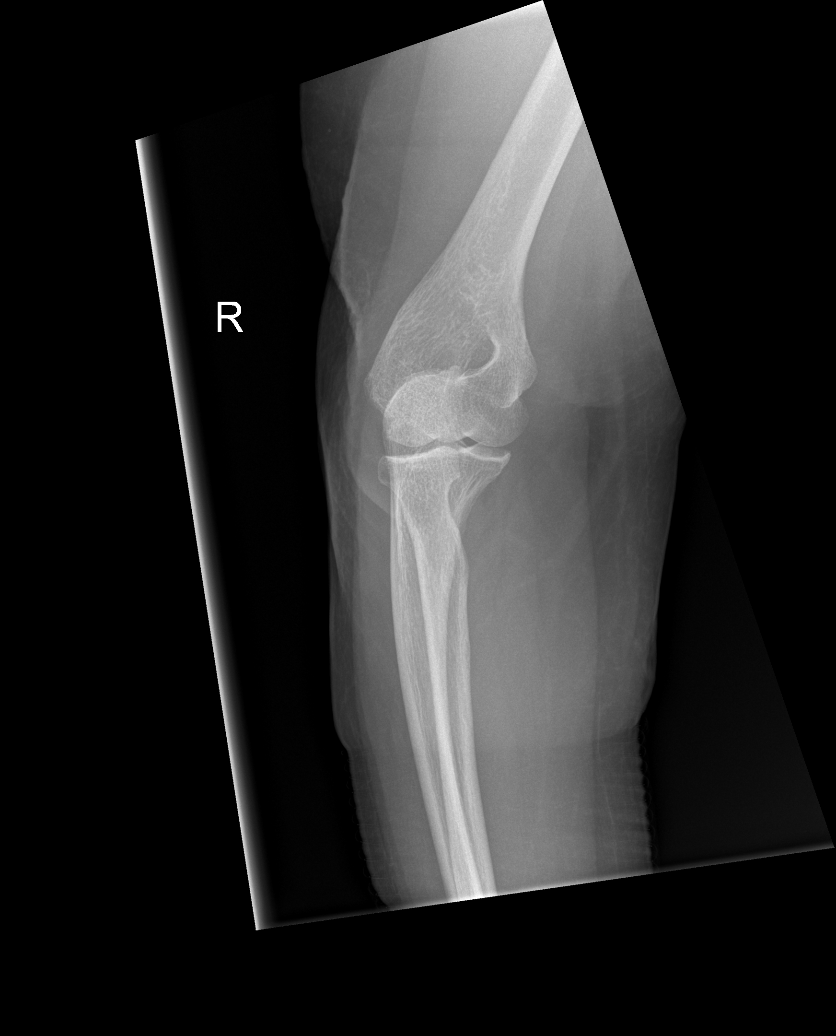

[x elbow ap right (2 of 2)]
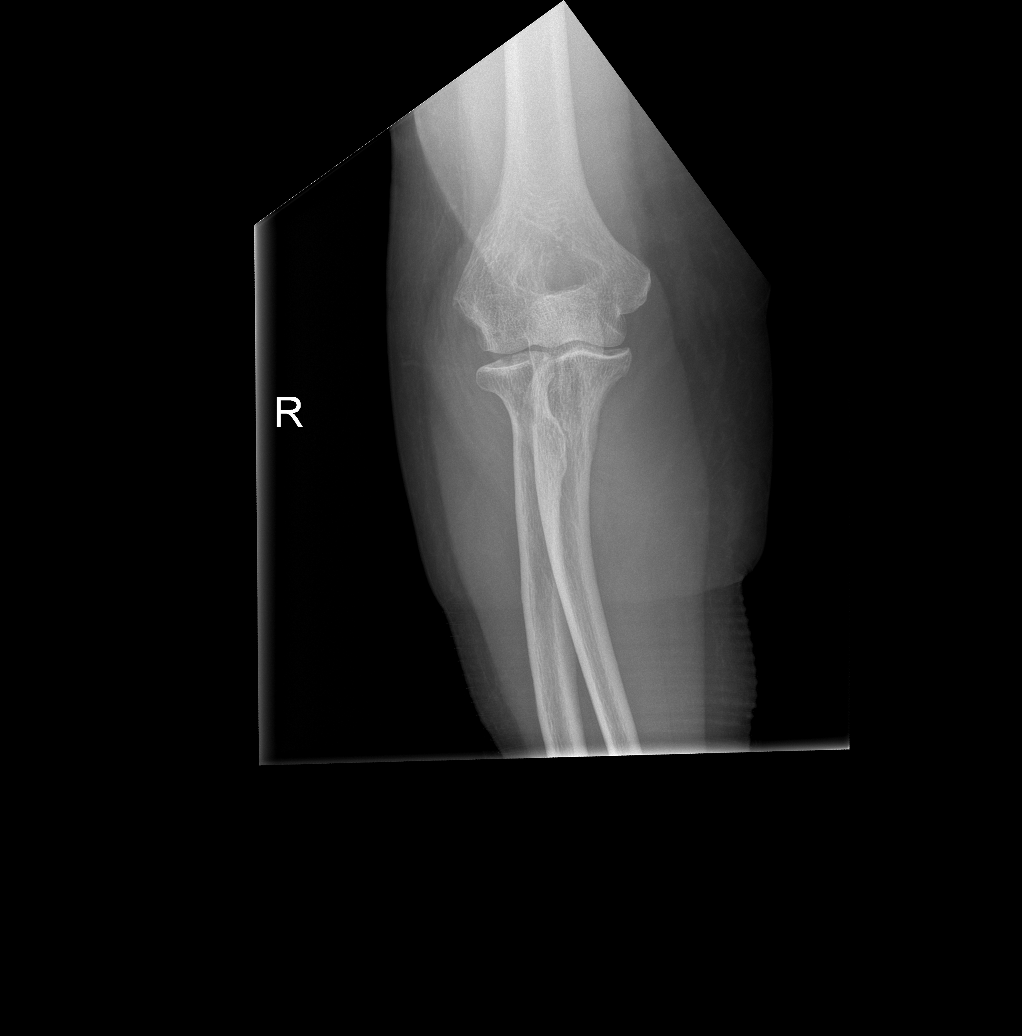

[x elbow lat right]
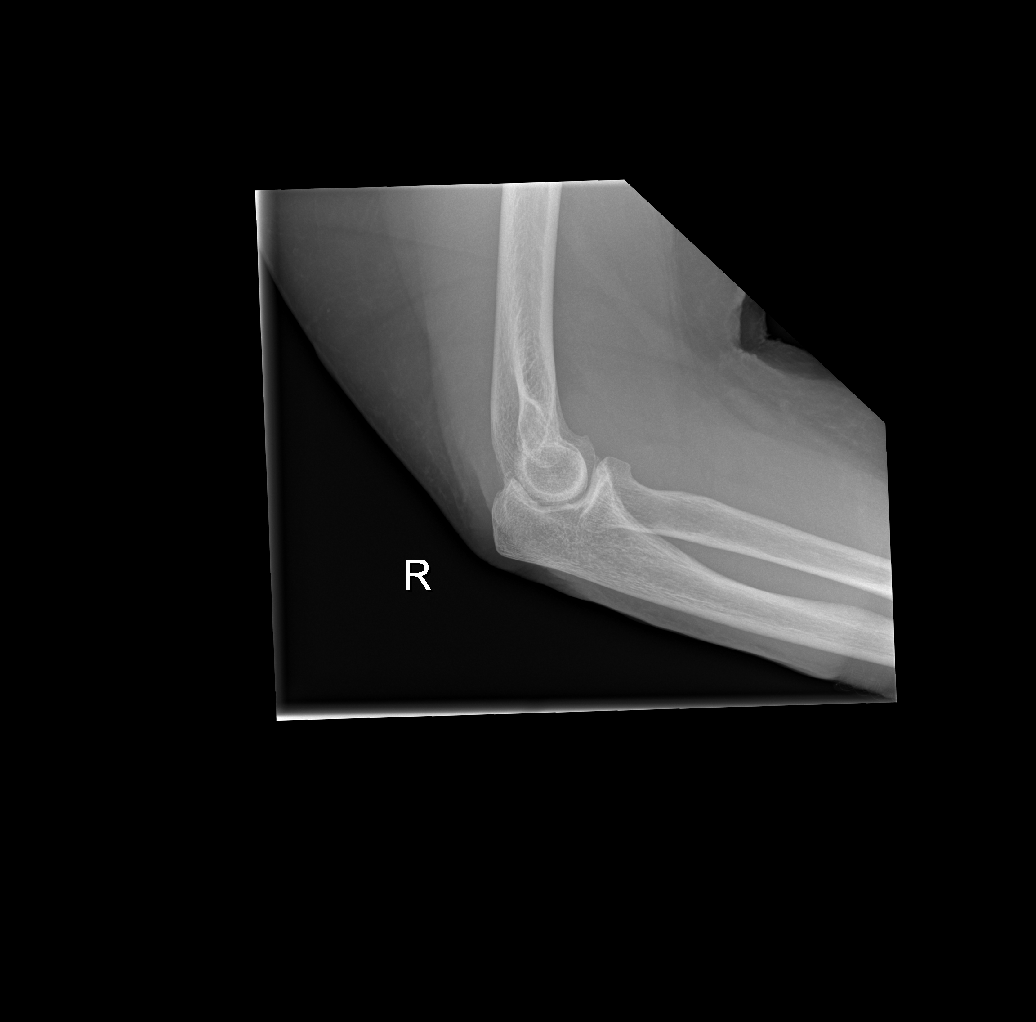

[4 of 4 positions shown; findings below may reference images not displayed]

FINDINGS: There is no evidence of fracture, dislocation, or joint effusion.
Mild osteoarthritis seen at the ulnar trochlear joint with tiny
anterior osteophytes. Soft tissues are unremarkable.
IMPRESSION: No acute osseous abnormality

## 2019-03-24 MED ORDER — LEVOTHYROXINE SODIUM 50 MCG PO TABS
50.0000 ug | ORAL_TABLET | Freq: Every day | ORAL | Status: DC
  Administered 2019-03-25: 50 ug via ORAL
  Filled 2019-03-24: qty 1

## 2019-03-24 MED ORDER — SODIUM CHLORIDE 0.9% FLUSH
3.0000 mL | Freq: Once | INTRAVENOUS | Status: AC
  Administered 2019-03-24: 3 mL via INTRAVENOUS

## 2019-03-24 MED ORDER — TRAMADOL HCL 50 MG PO TABS
50.0000 mg | ORAL_TABLET | ORAL | Status: DC | PRN
  Administered 2019-03-24 – 2019-03-25 (×2): 50 mg via ORAL
  Filled 2019-03-24 (×2): qty 1

## 2019-03-24 NOTE — ED Notes (Signed)
Patient transported to XR. 

## 2019-03-24 NOTE — ED Provider Notes (Addendum)
[facility] DEPARTMENT Provider Note   CSN: [id number] Arrival date & time: 03/24/19  1812     History Chief Complaint  Patient presents with  . Arm Pain    Stacey Velazquez is a 74 y.o. female.  Patient is a 74 year old female who presents with right elbow pain.  She states she woke up this morning and it was in an unusual position.  She says is been hurting since she woke up this morning.  She has not noted to be swollen.  No fevers.  No known injuries.  She also says that she has arthritis in her knee Is also bothering her.  She denies any injuries to that area as well.  She has been taking tramadol at home with no real improvement in symptoms.  She says she is allergic to most other pain medications.  She currently denies any other complaints.        Past Medical History:  Diagnosis Date  . Anxiety   . Autoimmune hepatitis ([place])   . Bipolar 1 disorder ([place])   . Carpal tunnel syndrome   . Cutaneous horn   . Drug-induced Parkinsonism ([place])   . Fibromyalgia   . GERD (gastroesophageal reflux disease)   . Glaucoma 2003   . Heart attack ([place])   . Heart disease   . Hepatitis, autoimmune ([place]) 08/25/2014  . Hypotension   . Hypothyroidism   . Neutropenia ([place])   . Pacemaker Medtronic    MRI compatible  . Paranoid schizophrenia ([employer])   . Polyarthritis   . Seizures ([place])    last sz 08/21/14  . Sinus arrest   . Stroke ([place])   . Vertigo    bvvp    Patient Active Problem List   Diagnosis Date Noted  . Hallucinations 03/13/2019  . Gastric ulcer   . Rectal polyp   . Adenomatous polyp of ascending colon   . Rectal bleeding 03/05/2019  . Vertigo   . Hypotension   . Heart disease   . GERD (gastroesophageal reflux disease)   . Autoimmune hepatitis ([place])   . Anxiety   . Low back pain 10/18/2016  . Gingival abscess 08/26/2016  . Acute right hip pain 08/26/2016  . Seizure disorder ([place]) 10/14/2014  . Drug-induced Parkinson's disease ([place])  10/14/2014  . Hypothyroidism 09/09/2014  . Azathioprine-induced leukopenia ([place]) 08/25/2014  . Thrombocytopenia due to drugs 08/25/2014  . Hepatitis, autoimmune ([place]) 08/25/2014  . Syncope   . Seizures ([place])   . Sinus arrest   . Pacemaker Medtronic   . Bipolar 1 disorder ([place])   . Paranoid schizophrenia ([place]) 03/25/2012  . Glaucoma 01/25/2001    Past Surgical History:  Procedure Laterality Date  . BIOPSY  03/06/2019   Procedure: BIOPSY;  Surgeon: Thornton Park, MD;  Location: [facility];  Service: Gastroenterology;;  EGD and COLON  . CATARACT EXTRACTION Left 2008  . COLONOSCOPY WITH PROPOFOL N/A 03/06/2019   Procedure: COLONOSCOPY WITH PROPOFOL;  Surgeon: Thornton Park, MD;  Location: [facility];  Service: Gastroenterology;  Laterality: N/A;  . ESOPHAGOGASTRODUODENOSCOPY (EGD) WITH PROPOFOL N/A 03/06/2019   Procedure: ESOPHAGOGASTRODUODENOSCOPY (EGD) WITH PROPOFOL ;  Surgeon: Thornton Park, MD;  Location: [facility];  Service: Gastroenterology;  Laterality: N/A;  . KNEE SURGERY Left 1985  . PACEMAKER INSERTION  2014  . PARTIAL HYSTERECTOMY  1979  . POLYPECTOMY  03/06/2019   Procedure: POLYPECTOMY;  Surgeon: Thornton Park, MD;  Location: [facility];  Service: Gastroenterology;;     OB History  No obstetric history on file.     Family History  Problem Relation Age of Onset  . Heart attack Father   . Heart disease Paternal Uncle   . Heart disease Paternal Grandmother   . Diabetes Paternal Grandmother   . Heart disease Paternal Grandfather   . Breast cancer Mother   . Cancer Maternal Grandmother   . CVA Maternal Grandmother   . Cancer Maternal Grandfather   . CVA Maternal Grandfather   . Diabetes Sister   . Diabetes Brother   . Diabetes Brother   . Stroke Daughter     Social History   Tobacco Use  . Smoking status: Former Smoker    Quit date: 03/02/2003    Years since quitting: 16.0  . Smokeless tobacco: Never Used  Substance Use Topics  .  Alcohol use: No  . Drug use: No    Home Medications Prior to Admission medications   Medication Sig Start Date End Date Taking? Authorizing Provider  levothyroxine (SYNTHROID) 50 MCG tablet Take 50 mcg by mouth daily before breakfast. 12/27/18   [provider]  potassium chloride (KLOR-CON) 10 MEQ tablet Take 2 tablets (20 mEq total) by mouth 2 (two) times daily for 3 days. 03/01/19 03/04/19  Thornton Park, MD  traMADol (ULTRAM) 50 MG tablet Take 50 mg by mouth every 4 (four) hours as needed (for pain.).     [provider]    Allergies    Ambien [zolpidem tartrate], Amitriptyline, Ampicillin, Anaprox [naproxen sodium], Benadryl [diphenhydramine hcl (sleep)], Cetirizine & related, Cortizone-10 [hydrocortisone], Darvon [propoxyphene], Diazepam, Diflunisal, Duloxetine, Flexeril [cyclobenzaprine], Lactose intolerance (gi), Lidocaine, Meperidine and related, Metanx [l-methylfolate-algae-b12-b6], Metoclopramide, Morphine and related, Nuprin [ibuprofen], Oxycodone, Penicillins, Propoxyphene, Ranitidine, Vicodin [hydrocodone-acetaminophen], and Tylenol [acetaminophen]  Review of Systems   Review of Systems  Constitutional: Negative for fever.  Gastrointestinal: Negative for nausea and vomiting.  Musculoskeletal: Positive for arthralgias. Negative for back pain, joint swelling and neck pain.  Skin: Negative for wound.  Neurological: Negative for weakness, numbness and headaches.    Physical Exam Updated Vital Signs BP 97/73 (BP Location: Left Arm)   Pulse 92   Temp 98.6 F (37 C) (Oral)   Resp 16   Ht 5\' 9"  (1.753 m)   Wt 90.7 kg   SpO2 98%   BMI 29.53 kg/m   Physical Exam Constitutional:      Appearance: She is well-developed.  HENT:     Head: Normocephalic and atraumatic.  Cardiovascular:     Rate and Rhythm: Normal rate.  Pulmonary:     Effort: Pulmonary effort is normal.  Musculoskeletal:        General: Tenderness present.     Cervical back: Normal range  of motion and neck supple.     Comments: Patient has tenderness on palpation and range of motion of the right elbow.  I do not appreciate an effusion.  There is no warmth or erythema.  There is no pain to the shoulder or wrist.  Pedal pulses are intact.  She has normal sensation and motor function distally.  There is minor tenderness on palpation of the anterior right knee.  No swelling or effusion.  No warmth or erythema.  Pedal pulses are intact.  Skin:    General: Skin is warm and dry.  Neurological:     Mental Status: She is alert and oriented to person, place, and time.     ED Results / Procedures / Treatments   Labs (all labs ordered are listed, but  only abnormal results are displayed) Labs Reviewed  COMPREHENSIVE METABOLIC PANEL - Abnormal; Notable for the following components:      Result Value   Sodium 133 (*)    Glucose, Bld 110 (*)    Calcium 7.9 (*)    Albumin 2.4 (*)    AST 67 (*)    ALT 55 (*)    All other components within normal limits  CBC - Abnormal; Notable for the following components:   Platelets 78 (*)    All other components within normal limits  LIPASE, BLOOD    EKG None  Radiology DG Elbow Complete Right  Result Date: 03/24/2019 CLINICAL DATA:  Elbow pain limited range of motion EXAM: RIGHT ELBOW - COMPLETE 3+ VIEW COMPARISON:  None. FINDINGS: There is no evidence of fracture, dislocation, or joint effusion. Mild osteoarthritis seen at the ulnar trochlear joint with tiny anterior osteophytes. Soft tissues are unremarkable. IMPRESSION: No acute osseous abnormality Electronically Signed   By: Prudencio Pair M.D.   On: 03/24/2019 20:06    Procedures Procedures (including critical care time)  Medications Ordered in ED Medications  sodium chloride flush (NS) 0.9 % injection 3 mL (3 mLs Intravenous Given 03/24/19 1907)    ED Course  I have reviewed the triage vital signs and the nursing notes.  Pertinent labs & imaging results that were available during  my care of the patient were reviewed by me and considered in my medical decision making (see chart for details).    MDM Rules/Calculators/A&P                      X-ray showed no acute abnormality.  Labs that were ordered in triage are nonconcerning.  She was not able to get any other pain medications as she is allergic to almost every other pain medication.  I was going to do a lidocaine patch but she is also allergic to that.  She feels more comfortable since being in the ED and she is just going to stick with her tramadol at home.  She has an elbow sling to use.  She was encouraged to follow-up with her PCP if her symptoms are not improving.  Patient states that she is not able to get around at home because she has to use a walker and cannot use her walker with her elbow hurting.  Initially the nurse contacted her daughter who stated that she was not able to take care of the patient tonight or stay with her.  The daughter recommended nursing home placement.  I spoke to the patient about this and she does not want to be placed in a nursing home.  I have attempted to call the daughter back numerous times and left a message and she is not answering her phone calls.  We will hold the patient in the ED until we can get a hold of family and will put in a transitional care consult. Final Clinical Impression(s) / ED Diagnoses Final diagnoses:  Right elbow pain    Rx / DC Orders ED Discharge Orders    None       Malvin Johns, MD 03/24/19 2051    Malvin Johns, MD 03/24/19 2107

## 2019-03-24 NOTE — ED Triage Notes (Signed)
The pt arrived by gems  W/c from ambulance  She woke up this am with pain in her entire rt arm from the shoulder down to her finger tips.  Radial pulse present  Then she started telling aboout her future abd surgery she has been ahvinf blood in her bms for a few days

## 2019-03-24 NOTE — ED Notes (Signed)
Called patient to be triaged x3 and had no answer.

## 2019-03-25 ENCOUNTER — Other Ambulatory Visit: Payer: Self-pay

## 2019-03-25 NOTE — ED Notes (Signed)
Pt states she lives along in a 1-bedroom apartment. States her daughter has the key. Also states she does not have her eyeglasses and thought she brought them w/her to ED. Advised pt she did not have them on upon this RN's arrival this am. RN and NT searched pt's room and belongings as requested. Unable to locate glasses. States "I told EMS I needed them when they picked me up" but states she does not recall if they got them for her. Pt assisting w/bathing by NT. Pt sitting up in recliner - states feels comfortable.

## 2019-03-25 NOTE — Progress Notes (Signed)
   03/25/19 1055  TOC ED Mini Assessment  What brought you to the Emergency Department?  Right arm pain, difficulty ambulating  Barrier interventions Needs family assistance  Patient states their goals for this hospitalization and ongoing recovery are: "Go back home."  CMS Medicare.gov Compare Post Acute Care list provided to: Patient  Choice offered to / list presented to  Patient   CSW spoke with patient regarding home health and need for DME. CSW noted patient verbalized consent to have [place] and DME arranged and stated no preference. CSW contacted [facility] agencies by highest rating and noted [facility] will be able to have [facility] PT/OT come out as early as tomorrow to begin treatment with patient and will additionally review for wheel chair is needed. CSW informed RN and RNCM. [place] liaison requesting patient be provided with her number if she has any questions: [name], [phone]

## 2019-03-25 NOTE — ED Notes (Signed)
Joe, SW, in w/pt.

## 2019-03-25 NOTE — ED Notes (Signed)
Daughter, [name] - [phone] - returned pt's call. She stated she has not received any other calls from ED. Advised she has pt's apartment Journalist, newspaper Living) key and EMS probably did not obtain her eyeglasses and feels they are at pt's apartment. RN advised SW / CM is setting up [place] for pt and transferred call to [place], [place].

## 2019-03-25 NOTE — ED Provider Notes (Signed)
Home health services and custodial care set up for patient.  Patient's daughter is coming to pick up patient.   Stacey Johns, MD 03/25/19 1343

## 2019-03-25 NOTE — Discharge Instructions (Signed)
Per [facility] they will meet patient at home tomorrow. For any questions or concerns you may reach their liaison Cheryl at [phone]

## 2019-03-25 NOTE — ED Notes (Signed)
Breakfast tray set up for pt. Pt feeding self w/left hand as right arm is in sling.

## 2019-03-25 NOTE — ED Notes (Addendum)
Pt SW, daughter is coming to pick up pt from ED - Pt aware and voices agreement w/tx plan. Pt sitting in recliner in room.

## 2019-03-25 NOTE — ED Notes (Signed)
Pt assisted to w/c d/t daughter has arrived to transport pt home. Pt verbalized she has all of her belongings - red bag and black purse. D/C instructions discussed and given - Pt voiced understanding - questions answered to satisfaction. Papers placed in pt's purse as requested. Pt escorted to lobby via w/c.

## 2019-03-25 NOTE — Progress Notes (Signed)
CSW spoke with patient's daughter regarding discharge. CSW provided information regarding home health coverage under medicare and self-pay for custodial care. CSW noted she had her keys to the apartment and will pick patient up in an hour with her husband to ensure patient can get into her apartment safely.

## 2019-03-27 ENCOUNTER — Ambulatory Visit: Payer: Medicare Other | Admitting: *Deleted

## 2019-03-27 DIAGNOSIS — E039 Hypothyroidism, unspecified: Secondary | ICD-10-CM | POA: Diagnosis not present

## 2019-03-27 DIAGNOSIS — R42 Dizziness and giddiness: Secondary | ICD-10-CM | POA: Diagnosis not present

## 2019-03-27 DIAGNOSIS — M797 Fibromyalgia: Secondary | ICD-10-CM | POA: Diagnosis not present

## 2019-03-27 DIAGNOSIS — M25321 Other instability, right elbow: Secondary | ICD-10-CM | POA: Diagnosis not present

## 2019-03-27 DIAGNOSIS — H409 Unspecified glaucoma: Secondary | ICD-10-CM | POA: Diagnosis not present

## 2019-03-27 DIAGNOSIS — M159 Polyosteoarthritis, unspecified: Secondary | ICD-10-CM | POA: Diagnosis not present

## 2019-03-27 DIAGNOSIS — G2119 Other drug induced secondary parkinsonism: Secondary | ICD-10-CM | POA: Diagnosis not present

## 2019-03-27 DIAGNOSIS — K754 Autoimmune hepatitis: Secondary | ICD-10-CM | POA: Diagnosis not present

## 2019-03-27 DIAGNOSIS — Z9181 History of falling: Secondary | ICD-10-CM | POA: Diagnosis not present

## 2019-03-27 DIAGNOSIS — K219 Gastro-esophageal reflux disease without esophagitis: Secondary | ICD-10-CM | POA: Diagnosis not present

## 2019-03-27 DIAGNOSIS — I519 Heart disease, unspecified: Secondary | ICD-10-CM | POA: Diagnosis not present

## 2019-03-27 DIAGNOSIS — F200 Paranoid schizophrenia: Secondary | ICD-10-CM | POA: Diagnosis not present

## 2019-03-27 DIAGNOSIS — Z8673 Personal history of transient ischemic attack (TIA), and cerebral infarction without residual deficits: Secondary | ICD-10-CM | POA: Diagnosis not present

## 2019-03-27 DIAGNOSIS — M79601 Pain in right arm: Secondary | ICD-10-CM | POA: Diagnosis not present

## 2019-03-27 DIAGNOSIS — M150 Primary generalized (osteo)arthritis: Secondary | ICD-10-CM | POA: Diagnosis not present

## 2019-03-27 DIAGNOSIS — G8929 Other chronic pain: Secondary | ICD-10-CM | POA: Diagnosis not present

## 2019-03-27 DIAGNOSIS — I252 Old myocardial infarction: Secondary | ICD-10-CM | POA: Diagnosis not present

## 2019-03-27 DIAGNOSIS — Z950 Presence of cardiac pacemaker: Secondary | ICD-10-CM | POA: Diagnosis not present

## 2019-03-27 DIAGNOSIS — F319 Bipolar disorder, unspecified: Secondary | ICD-10-CM | POA: Diagnosis not present

## 2019-03-27 DIAGNOSIS — G40909 Epilepsy, unspecified, not intractable, without status epilepticus: Secondary | ICD-10-CM | POA: Diagnosis not present

## 2019-03-27 DIAGNOSIS — F419 Anxiety disorder, unspecified: Secondary | ICD-10-CM | POA: Diagnosis not present

## 2019-03-27 DIAGNOSIS — Z87891 Personal history of nicotine dependence: Secondary | ICD-10-CM | POA: Diagnosis not present

## 2019-03-28 ENCOUNTER — Encounter: Payer: Self-pay | Admitting: Gastroenterology

## 2019-03-28 ENCOUNTER — Ambulatory Visit (INDEPENDENT_AMBULATORY_CARE_PROVIDER_SITE_OTHER): Payer: Medicare Other | Admitting: Gastroenterology

## 2019-03-28 VITALS — BP 124/78 | HR 76 | Temp 98.2°F

## 2019-03-28 DIAGNOSIS — M79601 Pain in right arm: Secondary | ICD-10-CM | POA: Diagnosis not present

## 2019-03-28 DIAGNOSIS — K7469 Other cirrhosis of liver: Secondary | ICD-10-CM

## 2019-03-28 DIAGNOSIS — R748 Abnormal levels of other serum enzymes: Secondary | ICD-10-CM | POA: Diagnosis not present

## 2019-03-28 DIAGNOSIS — I252 Old myocardial infarction: Secondary | ICD-10-CM | POA: Diagnosis not present

## 2019-03-28 DIAGNOSIS — K754 Autoimmune hepatitis: Secondary | ICD-10-CM

## 2019-03-28 DIAGNOSIS — K625 Hemorrhage of anus and rectum: Secondary | ICD-10-CM | POA: Diagnosis not present

## 2019-03-28 DIAGNOSIS — I519 Heart disease, unspecified: Secondary | ICD-10-CM | POA: Diagnosis not present

## 2019-03-28 DIAGNOSIS — F419 Anxiety disorder, unspecified: Secondary | ICD-10-CM | POA: Diagnosis not present

## 2019-03-28 DIAGNOSIS — M159 Polyosteoarthritis, unspecified: Secondary | ICD-10-CM | POA: Diagnosis not present

## 2019-03-28 DIAGNOSIS — G8929 Other chronic pain: Secondary | ICD-10-CM | POA: Diagnosis not present

## 2019-03-28 MED ORDER — AZATHIOPRINE 100 MG PO TABS: 100.0000 mg | ORAL_TABLET | Freq: Every day | ORAL | 3 refills | Status: DC

## 2019-03-28 NOTE — Progress Notes (Signed)
Referring Provider: Rocco Serene, MD Primary Care Physician:  Rocco Serene, MD  Chief complaint:  Rectal bleeding, change in stool caliber   IMPRESSION:  Rectal polyp presenting with bleeding andchange in stool caliber x 1 year    - Colonoscopy 03/06/19 showed a 20 mm polyp in the distal rectum adjacent to the dentate line.        - Worrisome for advance polyp and/or adenocarcinoma.        - Biopsies consistent with tubulovillous adenoma with high-grade grandular dysplasia.     - awaiting consultation with Dr. Johney Maine History of advanced colon polyps    -Multiple colonoscopies over the years with both tubular adenomas and tubulovillous adenomas    -Colonoscopy 2018 at [facility]: 36mm polyp in the ascending and at the hepatic flexure    -Surveillance colonoscopy recommended in 1 years Cirrhosis due to AIH    - 2/21 labs show a MELD of 11, CPT score 7    - hypoalbuminemia, mild coagulopathy, and thrombocytopenia.  Autoimmune hepatitis (AIH) previously on azathioprine    -Diagnosed in 2006    -Liver biopsy 2006: Grade 3, stage I autoimmune hepatitis    -Previously on azathioprine and steroids    -Last seen in the [facility] hepatology clinic 2014    -Most recently treated by Dr. Benson Norway, last in 2018    - IgG 3501 02/27/19 Gastric ulcer on EGD 02/2019    - H pylori negative on biopsies Esophageal varices on EGD 02/10/16 Benson Norway)    - grade 2 esophageal varices on EGD 02/2019 Recent hypokalemia Uses a walker at home, wheelchair when out of her apartment Transportation issues History of polyps    - 3 tubular adenomas, 2 hyperplastic polyps, and a large tubulovillous adenoma  Rectal polyp versus mass: This is the cause of her rectal bleeding. Discussed with Dr. Rush Landmark who did not feel she was a candidate for ESD. Patient declined follow-up with Dr. Stephanie Acre at [facility] due to transportation issues.  Referral to Dr. Johney Maine recommended to consider TEM/TAMIS of the lesion. Will determine surveillance interval  after the polyp is removed.   Autoimmune hepatitis: Off treatment for 2-3 years. Now with elevated liver enzymes Will resume azathioprine.  Plan close monitoring with monthly labs until she stabilizes.   Cirrhosis by labs and imaging: Continue screening for hepatocellular carcinoma recommended given her history of cirrhosis and esophageal varices. Proceed with cross-sectional screening in 6 months.   Esophageal varices:  Consider starting non-selective beta-blocker therapy on next visit if there are no obvious contraindication at that time.  Gastric ulcer: PPI BID. Consider repeat EGD in 3 months.   Recent hypokalemia: Will repeat K levels on upcoming labs.   PLAN: Consultation with Dr. Johney Maine later this month as previously scheduled High fiber diet recommended, drink at least 1.5-2 liters of water Add a daily stool bulking agent such as Metamucil Resume azathioprine 100 mg QD Monthly labs: CMP, CBC, IgG Omeprazole 40 mg BID Avoid NSAIDs EGD in 3-4 months to follow-up on the gastric ulcer MRI for hepatocellular carcinoma screening in August 2021 Obtain prior records from Dr. Benson Norway including EGD report Follow-up in this office in one month, earlier as needed   I spent 50 minutes of time, including in depth chart review, independent review of results as outlined above, communicating results with the patient directly, face-to-face time with the patient, coordinating care, ordering studies and medications as appropriate, and documentation.    HPI: Stacey Velazquez is a 74 y.o.  female under evaluation of rectal bleeding and a change in the caliber of her stool.  She was seen in consultation 02/27/19 and had endoscopic evaluation 03/06/19.  The interval history is obtained through the patient and review of EPIC.  She has a seizure disorder, fibromyalgia, sick sinus syndrome requiring a pacemaker 2014, autoimmune hepatitis, colon polyps, aortic atherosclerosis, and paranoid schizophrenia.  She has  previously been followed by the hepatology clinic at [facility] (last seen in 2014), the GI clinic at [facility] (last seen 2018), and by Dr. Carol Ada (last seen 12/13/16).   She has had one year of rectal bleeding with the feeling of blockage in the rectum preventing all of her stool to pass. Notes some liquids in addition to the solid stools. Has also noticed that her stools have become thinner.  She reports getting a colonoscopy every year from [phone] where polyps were always removed.  She moved to [facility] in 2014 and did not have a colonoscopy for several years.  A colonoscopy with Dr. Benson Norway 02/10/2016 showed 6 polyps.  4 polyps ranged from 3 to 6 mm were removed and the descending: And transverse colon.  There were 2 polyps ranging from 30 to 40 mm that were biopsied and tattooed in the ascending colon and transverse.  Pathology report showed tubulous villous adenoma of high-grade dysplasia.  The patient was referred to [facility].    Colonoscopy with Dr. Stephanie Acre at [facility] 05/26/16:  "The perianal and digital rectal examinations were normal.    A greater than 50 mm polyp was found in the proximal ascending colon.     The polyp was sessile. Preparations were made for mucosal resection.     Saline with methylene blue was injected to raise the lesion. Snare     mucosal resection was performed. Resection and retrieval were complete.     Coagulation for tissue destruction using snare was successful.    A 50 mm polyp was found in the hepatic flexure. The polyp was     semi-pedunculated. Preparations were made for mucosal resection. Saline     with methylene blue was injected to raise the lesion. Snare mucosal     resection was performed. Resection and retrieval were complete.     Coagulation for tissue destruction using snare was successful.    The exam was otherwise without abnormality on direct and retroflexion     views." Pathology revealed Tubulovillous adenoma (multiple fragments) with  multiple foci of high-grade dysplasia.  Surveillance colonoscopy recommended in 1 year.  She did not have a follow-up colonoscopy until 03/06/19.   Colonoscopy 03/06/19 showed a 20 mm polyp in the distal rectum adjacent to the dentate line. Worrisome for advance polyp and/or adenocarcinoma. Biopsies were consistent with tubulovillous adenoma with high-grade grandular dysplasia. She also had three 1 to 4 mm tubular adenomas and two hyperplastic polyps removed at that time.   She continues to have rectal bleeding.   Also has a longstanding history of autoimmune hepatitis and was previously followed by the [facility] hepatology clinic.  Last visit 2014.  She was on azathioprine 100 mg daily and tapering off her steroids at that time.  A liver biopsy 2006 of grade 3 stage I however I am unable to identify a date of the biopsy. More recently followed by Dr. Benson Norway for the autoimmune hepatitis. She has been off of medications since that time. She brings a copy of a letter from Dr. Benson Norway dated 02/14/19 that showed that she needed to have another EGD  due to "evidence of cirrhosis" and "varices" seen on her EGD.  However, she was unaware of the diagnosis of cirrhosis.  There is no history of jaundice, ascites, hepatic encephalopathy, hepatorenal syndrome, or hepatopulmonary syndrome.  Labs 02/27/19: 3501 Labs from 03/24/19 showed normal CMP except for Na 133, glucose 110, calcium 7.9, albumin 2.4, AST 67, ALT 55. WBC 4.4, hgb 13.4, platelets 78, MCV 94.5, INR 1.4.    EGD 03/06/19 showed  Grade II varices were found in the middle third of the esophagus. Moderate portal hypertensive gastropathy was found in the gastric body. Localized mild inflammation characterized by erythema, friability and granularity was found in the gastric fundus. One non-bleeding cratered gastric ulcer with no stigmata of bleeding was found on the lesser curvature of the stomach. Biopsies confirmed gastritis. There was no H pylori.   An abdominal ultrasound  from 02/19/2015 showed changes of cirrhosis with a subtle nodularity to the liver contours.  No focal abnormality.  Prior cholecystectomy.  Abdominal ultrasound 03/05/19 showed cirrhosis and splenomegaly. There was no ascites.   Continues to look for help. She is having difficulty with ADLs. She can't use her walker due to her right arm.  Shoulder pain is not responding to Tramadol. She is overwhelmed by her decline.    Past Medical History:  Diagnosis Date  . Anxiety   . Autoimmune hepatitis ([place])   . Bipolar 1 disorder ([place])   . Carpal tunnel syndrome   . Cutaneous horn   . Drug-induced Parkinsonism ([place])   . Fibromyalgia   . GERD (gastroesophageal reflux disease)   . Glaucoma 2003   . Heart attack ([place])   . Heart disease   . Hepatitis, autoimmune ([place]) 08/25/2014  . Hypotension   . Hypothyroidism   . Neutropenia ([place])   . Pacemaker Medtronic    MRI compatible  . Paranoid schizophrenia ([place])   . Polyarthritis   . Seizures ([place])    last sz 08/21/14  . Sinus arrest   . Stroke ([place])   . Vertigo    bvvp    Past Surgical History:  Procedure Laterality Date  . BIOPSY  03/06/2019   Procedure: BIOPSY;  Surgeon: Thornton Park, MD;  Location: [facility];  Service: Gastroenterology;;  EGD and COLON  . CATARACT EXTRACTION Left 2008  . COLONOSCOPY WITH PROPOFOL N/A 03/06/2019   Procedure: COLONOSCOPY WITH PROPOFOL;  Surgeon: Thornton Park, MD;  Location: [facility];  Service: Gastroenterology;  Laterality: N/A;  . ESOPHAGOGASTRODUODENOSCOPY (EGD) WITH PROPOFOL N/A 03/06/2019   Procedure: ESOPHAGOGASTRODUODENOSCOPY (EGD) WITH PROPOFOL ;  Surgeon: Thornton Park, MD;  Location: [facility];  Service: Gastroenterology;  Laterality: N/A;  . KNEE SURGERY Left 1985  . PACEMAKER INSERTION  2014  . PARTIAL HYSTERECTOMY  1979  . POLYPECTOMY  03/06/2019   Procedure: POLYPECTOMY;  Surgeon: Thornton Park, MD;  Location: [facility];  Service: Gastroenterology;;    Current  Outpatient Medications  Medication Sig Dispense Refill  . levothyroxine (SYNTHROID) 50 MCG tablet Take 50 mcg by mouth daily before breakfast.    . omeprazole (PRILOSEC) 20 MG capsule Take 20 mg by mouth daily.    . traMADol (ULTRAM) 50 MG tablet Take 50 mg by mouth every 4 (four) hours as needed (for pain.).      No current facility-administered medications for this visit.    Allergies as of 03/28/2019 - Review Complete 03/24/2019  Allergen Reaction Noted  . Ambien [zolpidem tartrate] Other (See Comments) 02/21/2013  . Amitriptyline Other (See Comments) 02/21/2013  .  Ampicillin Other (See Comments) 02/21/2013  . Anaprox [naproxen sodium] Other (See Comments) 02/21/2013  . Benadryl [diphenhydramine hcl (sleep)] Other (See Comments) 02/21/2013  . Cetirizine & related Other (See Comments) 02/21/2013  . Cortizone-10 [hydrocortisone] Other (See Comments) 02/21/2013  . Darvon [propoxyphene] Other (See Comments) 02/21/2013  . Diazepam Other (See Comments) 02/21/2013  . Diflunisal Swelling and Other (See Comments) 02/21/2013  . Duloxetine Other (See Comments) 02/21/2013  . Flexeril [cyclobenzaprine] Other (See Comments) 02/21/2013  . Lactose intolerance (gi) Swelling and Other (See Comments) 08/23/2014  . Lidocaine Other (See Comments) 02/21/2013  . Meperidine and related Other (See Comments) 02/21/2013  . Metanx [l-methylfolate-algae-b12-b6] Other (See Comments) 02/21/2013  . Metoclopramide Other (See Comments) 02/21/2013  . Morphine and related Other (See Comments) 02/21/2013  . Nuprin [ibuprofen] Other (See Comments) 02/21/2013  . Oxycodone Other (See Comments) 02/21/2013  . Penicillins Other (See Comments) 02/21/2013  . Propoxyphene Other (See Comments) 02/21/2013  . Ranitidine Other (See Comments) 02/21/2013  . Vicodin [hydrocodone-acetaminophen] Other (See Comments) 02/21/2013  . Tylenol [acetaminophen] Rash 02/21/2013    Family History  Problem Relation Age of Onset  . Heart  attack Father   . Heart disease Paternal Uncle   . Heart disease Paternal Grandmother   . Diabetes Paternal Grandmother   . Heart disease Paternal Grandfather   . Breast cancer Mother   . Cancer Maternal Grandmother   . CVA Maternal Grandmother   . Cancer Maternal Grandfather   . CVA Maternal Grandfather   . Diabetes Sister   . Diabetes Brother   . Diabetes Brother   . Stroke Daughter     Social History   Socioeconomic History  . Marital status: Widowed    Spouse name: Not on file  . Number of children: Not on file  . Years of education: Not on file  . Highest education level: Not on file  Occupational History  . Not on file  Tobacco Use  . Smoking status: Former Smoker    Quit date: 03/02/2003    Years since quitting: 16.0  . Smokeless tobacco: Never Used  Substance and Sexual Activity  . Alcohol use: No  . Drug use: No  . Sexual activity: Not on file  Other Topics Concern  . Not on file  Social History Narrative   Lives alone, "have friends and relatives that come and help me"   caffeine use- coffee -1 cup daily   Social Determinants of Health   Financial Resource Strain: Low Risk   . Difficulty of Paying Living Expenses: Not very hard  Food Insecurity: No Food Insecurity  . Worried About Charity fundraiser in the Last Year: Never true  . Ran Out of Food in the Last Year: Never true  Transportation Needs: Unmet Transportation Needs  . Lack of Transportation (Medical): Yes  . Lack of Transportation (Non-Medical): Yes  Physical Activity: Inactive  . Days of Exercise per Week: 0 days  . Minutes of Exercise per Session: 0 min  Stress: Stress Concern Present  . Feeling of Stress : To some extent  Social Connections: Somewhat Isolated  . Frequency of Communication with Friends and Family: More than three times a week  . Frequency of Social Gatherings with Friends and Family: More than three times a week  . Attends Religious Services: 1 to 4 times per year  .  Active Member of Clubs or Organizations: No  . Attends Archivist Meetings: Never  . Marital Status: Widowed  Intimate Partner Violence:  Not At Risk  . Fear of Current or Ex-Partner: No  . Emotionally Abused: No  . Physically Abused: No  . Sexually Abused: No    Physical Exam: General:   Alert,  well-nourished, pleasant and cooperative in NAD. Sitting in a wheelchair. Right arm in a sling.  Heart:  Regular rate and rhythm; no murmurs Pulm: Clear anteriorly; no wheezing Abdomen:  Soft. Central obesity. Nontender. Nondistended. Normal bowel sounds. No rebound or guarding. No fluid wave.  LAD: No inguinal or umbilical LAD Extremities:  Without edema. Neurologic:  Alert and  oriented x4;  grossly normal neurologically; no asterixis or clonus. Skin: No jaundice. Palmar erythema. Spider angioma on the chest wall.   Psych:  Alert and cooperative. Normal mood and affect.     Darnette Lampron L. Tarri Glenn, MD, MPH 03/28/2019, 12:43 PM

## 2019-03-28 NOTE — Patient Instructions (Addendum)
Come back for labs in one month.   A high fiber diet with plenty of fluids (up to 8 glasses of water daily) is suggested to relieve these symptoms.  Metamucil, 1 tablespoon once or twice daily can be used to keep bowels regular if needed.  Resume Azathioprine 100 mg daily.   You will be due for a recall MRI in 08/2019. We will send you a reminder in the mail when it gets closer to that time.

## 2019-03-30 DIAGNOSIS — F419 Anxiety disorder, unspecified: Secondary | ICD-10-CM | POA: Diagnosis not present

## 2019-03-30 DIAGNOSIS — G8929 Other chronic pain: Secondary | ICD-10-CM | POA: Diagnosis not present

## 2019-03-30 DIAGNOSIS — M79601 Pain in right arm: Secondary | ICD-10-CM | POA: Diagnosis not present

## 2019-03-30 DIAGNOSIS — I252 Old myocardial infarction: Secondary | ICD-10-CM | POA: Diagnosis not present

## 2019-03-30 DIAGNOSIS — I519 Heart disease, unspecified: Secondary | ICD-10-CM | POA: Diagnosis not present

## 2019-03-30 DIAGNOSIS — M159 Polyosteoarthritis, unspecified: Secondary | ICD-10-CM | POA: Diagnosis not present

## 2019-04-03 ENCOUNTER — Encounter: Payer: Self-pay | Admitting: *Deleted

## 2019-04-03 ENCOUNTER — Other Ambulatory Visit: Payer: Self-pay | Admitting: *Deleted

## 2019-04-03 DIAGNOSIS — K625 Hemorrhage of anus and rectum: Secondary | ICD-10-CM | POA: Diagnosis not present

## 2019-04-03 DIAGNOSIS — Z0000 Encounter for general adult medical examination without abnormal findings: Secondary | ICD-10-CM | POA: Diagnosis not present

## 2019-04-03 DIAGNOSIS — M130 Polyarthritis, unspecified: Secondary | ICD-10-CM | POA: Diagnosis not present

## 2019-04-03 DIAGNOSIS — K635 Polyp of colon: Secondary | ICD-10-CM | POA: Diagnosis not present

## 2019-04-03 DIAGNOSIS — Z008 Encounter for other general examination: Secondary | ICD-10-CM | POA: Diagnosis not present

## 2019-04-03 NOTE — Patient Outreach (Signed)
[facility]) Care Management  04/03/2019  Stacey Velazquez 02/04/1945 [id number]  CSW was able to make contact with patient today to follow-up regarding social work services and resources, as well as to ensure that patient received the packet of resource information mailed to her home by CSW.  Patient denied having received the information, despite CSW having mailed it out to her 10 business days ago, on Friday, March 23, 2019.  CSW agreed to mail all of the resource information to patient again, on Wednesday, April 04, 2019, which will include all of the following:  Advanced Directives (Living Will and Jenkins documents); [place]; [place]; Resources for Seniors. CSW will then follow-up with patient again next week, on Thursday, April 12, 2019, around 10:00am, to ensure that she received the resource information, as well as answer any questions she may have and assist with completion of her Advanced Directives.  [name], BSW, MSW, LCSW  Licensed Education officer, [facility]  [address] [place] [address], [place], [place] [address] Physical Address-[address], [place], [place] [address] Toll Free Main # [phone] Fax # [phone] Cell # [phone]  Office # [phone] [email or web address]

## 2019-04-04 DIAGNOSIS — M159 Polyosteoarthritis, unspecified: Secondary | ICD-10-CM | POA: Diagnosis not present

## 2019-04-04 DIAGNOSIS — M79601 Pain in right arm: Secondary | ICD-10-CM | POA: Diagnosis not present

## 2019-04-04 DIAGNOSIS — F419 Anxiety disorder, unspecified: Secondary | ICD-10-CM | POA: Diagnosis not present

## 2019-04-04 DIAGNOSIS — I519 Heart disease, unspecified: Secondary | ICD-10-CM | POA: Diagnosis not present

## 2019-04-04 DIAGNOSIS — I252 Old myocardial infarction: Secondary | ICD-10-CM | POA: Diagnosis not present

## 2019-04-04 DIAGNOSIS — G8929 Other chronic pain: Secondary | ICD-10-CM | POA: Diagnosis not present

## 2019-04-06 DIAGNOSIS — I519 Heart disease, unspecified: Secondary | ICD-10-CM | POA: Diagnosis not present

## 2019-04-06 DIAGNOSIS — I252 Old myocardial infarction: Secondary | ICD-10-CM | POA: Diagnosis not present

## 2019-04-06 DIAGNOSIS — M79601 Pain in right arm: Secondary | ICD-10-CM | POA: Diagnosis not present

## 2019-04-06 DIAGNOSIS — M159 Polyosteoarthritis, unspecified: Secondary | ICD-10-CM | POA: Diagnosis not present

## 2019-04-06 DIAGNOSIS — F419 Anxiety disorder, unspecified: Secondary | ICD-10-CM | POA: Diagnosis not present

## 2019-04-06 DIAGNOSIS — G8929 Other chronic pain: Secondary | ICD-10-CM | POA: Diagnosis not present

## 2019-04-09 ENCOUNTER — Ambulatory Visit: Payer: Self-pay | Admitting: Surgery

## 2019-04-09 ENCOUNTER — Encounter: Payer: Self-pay | Admitting: Surgery

## 2019-04-09 DIAGNOSIS — K7460 Unspecified cirrhosis of liver: Secondary | ICD-10-CM | POA: Insufficient documentation

## 2019-04-09 DIAGNOSIS — Z950 Presence of cardiac pacemaker: Secondary | ICD-10-CM | POA: Diagnosis not present

## 2019-04-09 DIAGNOSIS — D128 Benign neoplasm of rectum: Secondary | ICD-10-CM | POA: Diagnosis not present

## 2019-04-09 DIAGNOSIS — G8929 Other chronic pain: Secondary | ICD-10-CM | POA: Diagnosis not present

## 2019-04-09 DIAGNOSIS — K7469 Other cirrhosis of liver: Secondary | ICD-10-CM

## 2019-04-09 DIAGNOSIS — E669 Obesity, unspecified: Secondary | ICD-10-CM

## 2019-04-09 DIAGNOSIS — D696 Thrombocytopenia, unspecified: Secondary | ICD-10-CM | POA: Diagnosis not present

## 2019-04-09 DIAGNOSIS — M25562 Pain in left knee: Secondary | ICD-10-CM | POA: Diagnosis not present

## 2019-04-09 NOTE — H&P (Signed)
Norvel Richards Documented: 04/09/2019 10:52 AM Location: [facility] Patient #: [id number] DOB: [birth year] Widowed / Language: Cleophus Molt / Race: White Female  History of Present Illness Adin Hector MD; 04/09/2019 3:11 PM) The patient is a 74 year old female who presents with a colorectal polyp. Note for "Colorectal polyp": ` ` ` Patient sent for surgical consultation at the request of Laurier Nancy,  GI  Chief Complaint: Adenomatous polyp with high-grade dysplasia and distal rectum with a history of cirrhosis. Consider TEM resection ` ` The patient is an obese woman with multiple medical problems including autoimmune hepatitis progressing to cirrhosis. Has some esophageal varices but no major bleeding. History of colon polyps. Has had colonoscopies in the past. Required EMR resection in 2018. Switch from Dr. Benson Norway to the [facility] gastroenterology. Colonoscopy noted numerous polyps removed. However there was a distal rectal polyp was felt not to be easily amenable to resection. Biopsies showing adenomatous polyp. Internally referred to consider endoscopic resection but given high-grade dysplasia TEM resection was recommended. In surgical consultation.  She comes today to wheelchair. She struggles with severe joint pains. She notes her left knee is been very painful more recently. She's not seen an orthopedic surgeon well. She had cholecystectomy 2003. Tubal ligation 1974. No other abdominal surgery. She is lift on [employer] region but has been [place] for a few years. She moves her bowels about maybe once a week. Occasionally does MiraLAX but not consistently. Her main issues been rectal bleeding. Notes clots when she moves her bowels. She does not smoke. No alcohol intake. Her any history of bipolar and anxiety relatively stable. Takes tramadol for chronic joint pain. Recently it has been switched due to worsening discomfort and pain.  No personal  nor family history of GI/colon cancer, inflammatory bowel disease, irritable bowel syndrome, allergy such as Celiac Sprue, dietary/dairy problems, colitis, ulcers nor gastritis. No recent sick contacts/gastroenteritis. No travel outside the country. No changes in diet. No dysphagia to solids or liquids. No significant heartburn or reflux. No melena, hematemesis, coffee ground emesis. No evidence of prior gastric/peptic ulceration.  (Review of systems as stated in this history (HPI) or in the review of systems. Otherwise all other 12 point ROS are negative) ` ` ` SURGICAL PATHOLOGY CASE: [id number] PATIENT: Donneta Romberg Surgical Pathology Report     Clinical History: Rectal bleeding, hx of colon polyps, autoimmune hepatitis, esophageal varices (cm)     FINAL MICROSCOPIC DIAGNOSIS:  A. STOMACH, RANDOM, BIOPSY: - Antral and oxyntic mucosa with mild chronic gastritis. - Warthin-Starry negative for Helicobacter pylori. - No intestinal metaplasia, dysplasia or malignancy.  B. COLON, CECUM, POLYPECTOMY: - Tubular adenoma. - No high-grade dysplasia or carcinoma.  C. COLON, ASCENDING, POLYPECTOMY: - Tubular adenoma. - No high-grade dysplasia or carcinoma.  D. COLON, DESCENDING, POLYPECTOMY: - Tubular adenoma. - No high-grade dysplasia or carcinoma. - Hyperplastic polyp.  E. COLON, SIGMOID, POLYPECTOMY: - Hyperplastic polyp (s). - No adenomatous change or carcinoma.  F. RECTUM, BIOPSY: - Fragments of tubulovillous adenoma with high-grade glandular dysplasia. - See comment.  COMMENT: There are multiple fragments of tubulovillous adenoma with foci of high-grade glandular dysplasia. Invasive carcinoma is not identified in these biopsies.  Call to Dr. Tarri Glenn on 03/07/2019.    Manroop Jakubowicz DESCRIPTION: A: Received in formalin are tan, soft tissue fragments that are submitted in toto. Number: 6 size: 0.3-0.6 cm blocks: 1 B: Received in formalin are tan, soft tissue  fragments that are submitted in toto. Number: 2 size: 0.6 and  1.3 cm blocks: 1 C: Received in formalin are tan, soft tissue fragments that are submitted in toto. Number: 2 size: 1.0 x 1.2 cm blocks: 1 D: Received in formalin are tan, soft tissue fragments that are submitted in toto. Number: 2 size: 0.7 and 0.8 cm blocks: 1 E: Received in formalin are tan, soft tissue fragments that are submitted in toto. Number: 2 size: 0.4 and 0.5 cm blocks: 1 F: Received in formalin are tan, soft tissue fragments that are submitted in toto. Number: 8 size: 0.3-0.4 cm blocks: 1 (GRP 03/06/2019)   Final Diagnosis performed by Claudette Laws, MD. Electronically signed 03/07/2019 Technical and / or Professional components performed at [facility], [place] [address], [place], [place] [address]. Immunohistochemistry Technical component (if applicable) was performed at [facility]. [address], [place], [place], [place] [address]. IMMUNOHISTOCHEMISTRY DISCLAIMER (if applicable): Some of these immunohistochemical stains may have been developed and the performance characteristics determine by [facility]. Some may not have been cleared or approved by the U.S. Food and Drug Administration. The FDA has determined that such clearance or approval is not necessary. This test is used for clinical purposes. It should not be regarded as investigational or for research. This laboratory is certified under the [place] (CLIA-88) as qualified to perform high complexity clinical laboratory testing. The controls stained appropriately.    This patient encounter took 50 minutes today to perform the following: obtain history, perform exam, review outside records, interpret tests & imaging, counsel the patient on their diagnosis; and, document this encounter, including findings & plan in the electronic health record (EHR).   Past  Surgical History (Chanel Teressa Senter, CMA; 04/09/2019 10:52 AM) Colon Polyp Removal - Colonoscopy Gallbladder Surgery - Open Hysterectomy (not due to cancer) - Partial Tonsillectomy  Diagnostic Studies History (Chanel Teressa Senter, CMA; 04/09/2019 10:52 AM) Colonoscopy within last year Mammogram 1-3 years ago Pap Smear >5 years ago  Allergies (Chanel Teressa Senter, CMA; 04/09/2019 10:52 AM) Ambien *HYPNOTICS/SEDATIVES/SLEEP DISORDER AGENTS* shortness of breath Amitriptyline HCl *ANTIDEPRESSANTS* Hives Ampicillin *PENICILLINS* Hives Anaprox *ANALGESICS - ANTI-INFLAMMATORY* Rash Benadryl *ANTIHISTAMINES* Cetirizine HCl *ANTIHISTAMINES* Cortisone *CORTICOSTEROIDS* Darvon *ANALGESICS - OPIOID* Nausea and vomiting DiazePAM *ANTIANXIETY AGENTS* Hives Diflunisal *ANALGESICS - NonNarcotic* Swelling DULoxetine HCl *ANTIDEPRESSANTS* nausea Flexeril *MUSCULOSKELETAL THERAPY AGENTS* hives Lactose Intolerance *DIGESTIVE AIDS* swelling Lidocaine *CHEMICALS* Swelling Allergies Reconciled  Medication History (Chanel Nolan, CMA; 04/09/2019 10:53 AM) traMADol HCl (50MG  Tablet, Oral) Active. AzaTHIOprine (50MG  Tablet, Oral daily) Active. Levothyroxine Sodium (100MCG Tablet, Oral daily) Active. Omeprazole (20MG  Capsule DR, Oral) Active. Medications Reconciled  Social History (Chanel Teressa Senter, CMA; 04/09/2019 10:52 AM) No alcohol use No caffeine use No drug use Tobacco use Former smoker.  Family History ([place], [place]; 04/09/2019 10:52 AM) Alcohol Abuse Brother, Family Members In General, Father, Sister. Arthritis Family Members In General, Mother. Breast Cancer Mother. Cervical Cancer Family Members In General. Colon Cancer Family Members In General. Colon Polyps Family Members In General. Depression Father, Mother. Diabetes Mellitus Brother, Family Members In General. Heart Disease Daughter, Family Members In [place], Father. Heart disease in female family member before  age 47 Hypertension Brother, Daughter, Father, Mother, Sister. Migraine Headache Family Members In General. Ovarian Cancer Sister. Seizure disorder Family Members In General. Thyroid problems Daughter, Family Members In General.  Pregnancy / Birth History Antonietta Jewel, [place]; 04/09/2019 10:52 AM) Age at menarche 22 years. Age of menopause <44 Contraceptive History Intrauterine device, Oral contraceptives. Gravida 3 Maternal age 53-20 Para 2  Other Problems (  Chanel Teressa Senter, CMA; 04/09/2019 10:52 AM) Anxiety Disorder Arthritis Back Pain Cerebrovascular Accident Chest pain Cholelithiasis Cirrhosis Of Liver Gastroesophageal Reflux Disease Migraine Headache Myocardial infarction Seizure Disorder Thyroid Disease     Review of Systems (Chanel Nolan CMA; 04/09/2019 10:52 AM) General Present- Appetite Loss and Fatigue. Not Present- Chills, Fever, Night Sweats, Weight Gain and Weight Loss. Skin Present- Dryness. Not Present- Change in Wart/Mole, Hives, Jaundice, New Lesions, Non-Healing Wounds, Rash and Ulcer. HEENT Present- Wears glasses/contact lenses. Not Present- Earache, Hearing Loss, Hoarseness, Nose Bleed, Oral Ulcers, Ringing in the Ears, Seasonal Allergies, Sinus Pain, Sore Throat, Visual Disturbances and Yellow Eyes. Respiratory Not Present- Bloody sputum, Chronic Cough, Difficulty Breathing, Snoring and Wheezing. Breast Not Present- Breast Mass, Breast Pain, Nipple Discharge and Skin Changes. Cardiovascular Present- Leg Cramps and Swelling of Extremities. Not Present- Chest Pain, Difficulty Breathing Lying Down, Palpitations, Rapid Heart Rate and Shortness of Breath. Gastrointestinal Present- Abdominal Pain, Bloating, Bloody Stool, Change in Bowel Habits, Constipation, Excessive gas, Gets full quickly at meals, Indigestion and Rectal Pain. Not Present- Chronic diarrhea, Difficulty Swallowing, Hemorrhoids, Nausea and Vomiting. Female Genitourinary Present-  Pelvic Pain. Not Present- Frequency, Nocturia, Painful Urination and Urgency. Musculoskeletal Present- Back Pain, Joint Pain, Muscle Weakness and Swelling of Extremities. Not Present- Joint Stiffness and Muscle Pain. Neurological Present- Tingling, Tremor, Trouble walking and Weakness. Not Present- Decreased Memory, Fainting, Headaches, Numbness and Seizures. Psychiatric Present- Bipolar and Change in Sleep Pattern. Not Present- Anxiety, Depression, Fearful and Frequent crying. Endocrine Not Present- Cold Intolerance, Excessive Hunger, Hair Changes, Heat Intolerance, Hot flashes and New Diabetes. Hematology Present- Easy Bruising. Not Present- Blood Thinners, Excessive bleeding, Gland problems, HIV and Persistent Infections.  Vitals (Chanel Nolan CMA; 04/09/2019 10:53 AM) 04/09/2019 10:53 AM Height: 69in Temp.: 97.68F  Pulse: 79 (Regular)  BP: 126/76 (Sitting, Left Arm, Standard)        Physical Exam Adin Hector MD; 04/09/2019 3:12 PM)  General Mental Status-Alert. General Appearance-Not in acute distress, Not Sickly. Orientation-Oriented X3. Hydration-Well hydrated. Voice-Normal. Note: Sitting in wheelchair. Moves very slowly due to severe left knee and other joint pain.  Integumentary Global Assessment Upon inspection and palpation of skin surfaces of the - Axillae: non-tender, no inflammation or ulceration, no drainage. and Distribution of scalp and body hair is normal. General Characteristics Temperature - normal warmth is noted.  Head and Neck Head-normocephalic, atraumatic with no lesions or palpable masses. Face Global Assessment - atraumatic, no absence of expression. Neck Global Assessment - no abnormal movements, no bruit auscultated on the right, no bruit auscultated on the left, no decreased range of motion, non-tender. Trachea-midline. Thyroid Gland Characteristics - non-tender.  Eye Eyeball - Left-Extraocular movements intact, No  Nystagmus - Left. Eyeball - Right-Extraocular movements intact, No Nystagmus - Right. Cornea - Left-No Hazy - Left. Cornea - Right-No Hazy - Right. Sclera/Conjunctiva - Left-No scleral icterus, No Discharge - Left. Sclera/Conjunctiva - Right-No scleral icterus, No Discharge - Right. Pupil - Left-Direct reaction to light normal. Pupil - Right-Direct reaction to light normal.  ENMT Ears Pinna - Left - no drainage observed, no generalized tenderness observed. Pinna - Right - no drainage observed, no generalized tenderness observed. Nose and Sinuses External Inspection of the Nose - no destructive lesion observed. Inspection of the nares - Left - quiet respiration. Inspection of the nares - Right - quiet respiration. Mouth and Throat Lips - Upper Lip - no fissures observed, no pallor noted. Lower Lip - no fissures observed, no pallor noted. Nasopharynx - no discharge present.  Oral Cavity/Oropharynx - Tongue - no dryness observed. Oral Mucosa - no cyanosis observed. Hypopharynx - no evidence of airway distress observed.  Chest and Lung Exam Inspection Movements - Normal and Symmetrical. Accessory muscles - No use of accessory muscles in breathing. Palpation Palpation of the chest reveals - Non-tender. Auscultation Breath sounds - Normal and Clear.  Cardiovascular Auscultation Rhythm - Regular. Murmurs & Other Heart Sounds - Auscultation of the heart reveals - No Murmurs and No Systolic Clicks.  Abdomen Inspection Inspection of the abdomen reveals - No Visible peristalsis and No Abnormal pulsations. Umbilicus - No Bleeding, No Urine drainage. Palpation/Percussion Palpation and Percussion of the abdomen reveal - Soft, Non Tender, No Rebound tenderness, No Rigidity (guarding) and No Cutaneous hyperesthesia. Note: Abdomen obese but soft. Small infraumbilical old bruise. Soft. Not distended. Moderate panniculus back clean with good hygiene. No caput medusa. No  telangiectasias. No diastasis recti. No umbilical or other anterior abdominal wall hernias  Female Genitourinary Sexual Maturity Tanner 5 - Adult hair pattern. Note: No vaginal bleeding nor discharge  Rectal Note: Perianal skin clear. No pruritus. No warts. No pilonidal disease. No sphincter tone. No external hemorrhoids. No fissure. No fistula.  Pedunculated polypoid mass felt 2.5 cm from anal verge. Posterior midline.  Peripheral Vascular Upper Extremity Inspection - Left - No Cyanotic nailbeds - Left, Not Ischemic. Inspection - Right - No Cyanotic nailbeds - Right, Not Ischemic.  Neurologic Neurologic evaluation reveals -normal attention span and ability to concentrate, able to name objects and repeat phrases. Appropriate fund of knowledge , normal sensation and normal coordination. Mental Status Affect - not angry, not paranoid. Cranial Nerves-Normal Bilaterally. Gait-Normal.  Neuropsychiatric Mental status exam performed with findings of-able to articulate well with normal speech/language, rate, volume and coherence, thought content normal with ability to perform basic computations and apply abstract reasoning and no evidence of hallucinations, delusions, obsessions or homicidal/suicidal ideation.  Musculoskeletal Global Assessment Spine, Ribs and Pelvis - no instability, subluxation or laxity. Right Upper Extremity - no instability, subluxation or laxity. Note: Was very slowly. Left knee very sensitive but no obvious drawer sign or fracture.  Lymphatic Head & Neck  General Head & Neck Lymphatics: Bilateral - Description - No Localized lymphadenopathy. Axillary  General Axillary Region: Bilateral - Description - No Localized lymphadenopathy. Femoral & Inguinal  Generalized Femoral & Inguinal Lymphatics: Left - Description - No Localized lymphadenopathy. Right - Description - No Localized lymphadenopathy.    Assessment & Plan Adin Hector MD; 04/09/2019  3:12 PM)  ADENOMATOUS RECTAL POLYP (D12.8) Impression: Adenomatous rectal polyp with bleeding in the setting of autoimmune hepatitis steroid-dependent cirrhosis. No major portal hypertension.  Standard of care would be removal. Not felt to be amenable to endoscopic resection. We do TEM partial proctectomy. Usually can be an outpatient surgery this distally, the most likely would observe at least overnight given her numerous comorbidities her operative risks are increased but her bleeding is not mild and I think we're going to have to take the risk of proceeding with surgery.  Would like a cardiac clearance given her history of sinus arrest and syncope with Medtronic pacemaker placement. Usually followed by [facility]. Dr. Virl Axe  Current Plans Pt Education - Polyps in the Colon and Rectum (Colonic and Rectal Polyps): colonic polyps Pt Education - CCS TEM Education (Evanthia Maund): discussed with patient and provided information.  PREOP COLON - ENCOUNTER FOR PREOPERATIVE EXAMINATION FOR GENERAL SURGICAL PROCEDURE (Z01.818)  Current Plans You are being scheduled for surgery-  Our schedulers will call you.  You should hear from our office's scheduling department within 5 working days about the location, date, and time of surgery. We try to make accommodations for patient's preferences in scheduling surgery, but sometimes the OR schedule or the surgeon's schedule prevents [place] from making those accommodations.  If you have not heard from our office [phone]) in 5 working days, call the office and ask for your surgeon's nurse.  If you have other questions about your diagnosis, plan, or surgery, call the office and ask for your surgeon's nurse.  Written instructions provided The anatomy & physiology of the digestive tract was discussed. The pathophysiology of the rectal pathology was discussed. Natural history risks without surgery was discussed. I feel the risks of no  intervention will lead to serious problems that outweigh the operative risks; therefore, I recommended surgery.  Laparoscopic & open abdominal techniques were discussed. I recommended we start with a partial proctectomy by transanal endoscopic microsurgery (TEM) for excisional biopsy to remove the pathology and hopefully cure and/or control the pathology. This technique can offer less operative risk and faster post-operative recovery. Possible need for immediate or later abdominal surgery for further treatment was discussed.  Risks such as bleeding, abscess, reoperation, ostomy, heart attack, death, and other risks were discussed. I noted a good likelihood this will help address the problem. Goals of post-operative recovery were discussed as well. We will work to minimize complications. An educational handout was given as well. Questions were answered. The patient expresses understanding & wishes to proceed with surgery.  Pt Education - CCS Colon Bowel Prep 2018 ERAS/Miralax/Antibiotics Started Neomycin Sulfate 500 MG Oral Tablet, 2 (two) Tablet SEE NOTE, #6, 04/09/2019, No Refill. Local Order: Pharmacist Notes: TAKE TWO TABLETS AT 2 PM, 3 PM, AND 10 PM THE DAY PRIOR TO SURGERY Started Flagyl 500 MG Oral Tablet, 2 (two) Tablet SEE NOTE, #6, 04/09/2019, No Refill. Local Order: Pharmacist Notes: Take at 2pm, 3pm, and 10pm the day prior to your colon operation Pt Education - Pamphlet Given - Laparoscopic Colorectal Surgery: discussed with patient and provided information. Pt Education - CCS Colectomy post-op instructions: discussed with patient and provided information.  PACEMAKER (Z95.0) Impression: History of pacemaker for syncopal event when she lived in [place]. Has been intermittently investigated by cardiology. Dr. Caryl Comes follows. I would definitely want cardiac clearance before proceeding with surgery. I think we are somewhat stuck, but would like to try and make sure we can minimize  risks  Current Plans I recommended obtaining preoperative cardiac clearance. I am concerned about the health of the patient and the ability to tolerate the operation. Therefore, we will request clearance by cardiology to better assess operative risk & see if a reevaluation, further workup, etc is needed. Also recommendations on how medications such as for anticoagulation and blood pressure should be managed/held/restarted after surgery.  THROMBOCYTOPENIA (D69.6) Impression: Thrombocytopenia with platelet counts and 60s-70s. Probably did not need to transfuse periop unless gets below 50. Recheck labs preoperatively   CHRONIC PAIN OF LEFT KNEE (M25.562) Impression: Worsening pain and left knee. Recommended she consider evaluation by orthopedics. She had seen [facility] in the past but cannot remember which physician. Defer to the patient's primary care physician.  Adin Hector, MD, FACS, MASCRS Gastrointestinal and Minimally Invasive Surgery  [facility] [address], [place], [place] [address] [phone] Main / Paging [phone] Fax

## 2019-04-10 ENCOUNTER — Telehealth: Payer: Self-pay | Admitting: Gastroenterology

## 2019-04-10 DIAGNOSIS — M79601 Pain in right arm: Secondary | ICD-10-CM | POA: Diagnosis not present

## 2019-04-10 DIAGNOSIS — M159 Polyosteoarthritis, unspecified: Secondary | ICD-10-CM | POA: Diagnosis not present

## 2019-04-10 DIAGNOSIS — I252 Old myocardial infarction: Secondary | ICD-10-CM | POA: Diagnosis not present

## 2019-04-10 DIAGNOSIS — I519 Heart disease, unspecified: Secondary | ICD-10-CM | POA: Diagnosis not present

## 2019-04-10 DIAGNOSIS — F419 Anxiety disorder, unspecified: Secondary | ICD-10-CM | POA: Diagnosis not present

## 2019-04-10 DIAGNOSIS — G8929 Other chronic pain: Secondary | ICD-10-CM | POA: Diagnosis not present

## 2019-04-10 MED ORDER — AZATHIOPRINE 100 MG PO TABS: 100.0000 mg | ORAL_TABLET | Freq: Every day | ORAL | 3 refills | Status: DC

## 2019-04-10 NOTE — Telephone Encounter (Signed)
Is it ok to switch?

## 2019-04-10 NOTE — Telephone Encounter (Signed)
OK to switch. Thank you.

## 2019-04-10 NOTE — Telephone Encounter (Signed)
Switched medication to generic azathioprine sent to [facility].

## 2019-04-11 ENCOUNTER — Other Ambulatory Visit: Payer: Self-pay | Admitting: *Deleted

## 2019-04-11 DIAGNOSIS — I519 Heart disease, unspecified: Secondary | ICD-10-CM | POA: Diagnosis not present

## 2019-04-11 DIAGNOSIS — M159 Polyosteoarthritis, unspecified: Secondary | ICD-10-CM | POA: Diagnosis not present

## 2019-04-11 DIAGNOSIS — G8929 Other chronic pain: Secondary | ICD-10-CM | POA: Diagnosis not present

## 2019-04-11 DIAGNOSIS — I252 Old myocardial infarction: Secondary | ICD-10-CM | POA: Diagnosis not present

## 2019-04-11 DIAGNOSIS — M79601 Pain in right arm: Secondary | ICD-10-CM | POA: Diagnosis not present

## 2019-04-11 DIAGNOSIS — F419 Anxiety disorder, unspecified: Secondary | ICD-10-CM | POA: Diagnosis not present

## 2019-04-11 NOTE — Patient Outreach (Signed)
[facility]) Care Management  04/11/2019  Stacey Velazquez 08-03-1945 [id number]   Subjective: Telephone call to patient's home  number, no answer, no voicemail or answering machine, unable to leave a message.    Objective:Per KPN (Knowledge Performance Now, point of care tool) and chart review,patient hospitalized 03/05/2019 - 03/06/2019 for rectal bleeding,Gastric ulcer,Rectal polyp,Adenomatous polyp of ascending colon, [facility] preparation of EGD and colonoscopy on February 9 due to transportation issues.Patient also has a history ofanxiety, autoimmune hepatitis withsuspectedcirrhosis, bipolar 1 disorder, fibromyalgia, GERD, hypothyroidism, history of seizures last in 2016,Drug-induced Parkinsonism,Glaucoma,Heart attack,Pacemaker Medtronic,Paranoid schizophrenia,Polyarthritis, and stroke.     Assessment: Received Medicare [place] follow up referral on 03/13/2019. Referral sources: [place]. Referral reason: care management/social work outreach. Medical conditions:internal bleeding, heart condition, hepatitis, arthritis.Screening follow up completed.  Patient referred to [place] Management Social Worker for the following: Medicaid application, housekeeping resources, and Advanced Directives documents completion.      Plan:RNCM has referred patient to [place] Management Social Worker for the following: Medicaid application assistance, housekeeping / Education administrator, and Advanced Directives document completion.   RNCM will call patient for 2nd telephone outreach attempt within 4  business days, assess for CM needs, and will proceed with case closure to this RNCM within 10 business days if no return call.     Crit Obremski H. Annia Friendly, BSN, [place] Management [facility] Telephonic CM Phone: [phone] Fax: [phone]

## 2019-04-12 ENCOUNTER — Other Ambulatory Visit: Payer: Self-pay | Admitting: *Deleted

## 2019-04-12 DIAGNOSIS — G8929 Other chronic pain: Secondary | ICD-10-CM | POA: Diagnosis not present

## 2019-04-12 DIAGNOSIS — M79601 Pain in right arm: Secondary | ICD-10-CM | POA: Diagnosis not present

## 2019-04-12 DIAGNOSIS — F419 Anxiety disorder, unspecified: Secondary | ICD-10-CM | POA: Diagnosis not present

## 2019-04-12 DIAGNOSIS — I252 Old myocardial infarction: Secondary | ICD-10-CM | POA: Diagnosis not present

## 2019-04-12 DIAGNOSIS — M159 Polyosteoarthritis, unspecified: Secondary | ICD-10-CM | POA: Diagnosis not present

## 2019-04-12 DIAGNOSIS — I519 Heart disease, unspecified: Secondary | ICD-10-CM | POA: Diagnosis not present

## 2019-04-12 NOTE — Patient Outreach (Signed)
[facility]) Care Management  04/12/2019  CRYSTELLE FERRUFINO 11-05-1945 [id number]   CSW has made several attempts to try and contact patient today to follow-up regarding social work services and resources; however, patient has not been available at the time of CSW's calls, nor does patient's answering machine pick up for CSW to leave a HIPAA compliant message on voicemail.  CSW will make a second outreach attempt within the next 3-4 business days, on Wednesday, April 18, 2019, if a return call is not received from patient in the meantime.  [name], BSW, MSW, LCSW  Licensed Education officer, [facility]  [place] [place] [address] [place], [place] [address] Physical Address-[address], [place], [place] [address] Toll Free Main # [phone] Fax # [phone] Cell # [phone]  Office # [phone] [email or web address]

## 2019-04-13 ENCOUNTER — Other Ambulatory Visit: Payer: Self-pay | Admitting: *Deleted

## 2019-04-13 NOTE — Patient Outreach (Signed)
[facility]) Care Management  04/13/2019  Stacey Velazquez 07-Feb-1945 [id number]   Subjective: Telephone call to patient's home number, spoke with patient, and HIPAA verified.  Patient states she is making it and has a lot going on. RNCM advised this RNCM and [facility] Care Management Social Worker [place], have been trying to reach patient for follow up.   States she has been having trouble with phone and did not realize that she was missing calls until recently, is in the process of getting it repaired.  Discussed the outcome of our previous conversations, patient voiced understanding, and is in agreement to follow up.  States she had an appointment with the surgeon on 04/09/2019, treatment plan for growth found during most recent colonoscopy was discussed and she is waiting for call back from MD's office regarding surgery date.  Wellsite geologist was very knowledgeable, very nice, staff was very nice, he was able to answer all of her questions, and is planning to discuss treatment plan with her cardiologist prior to scheduling surgery.   States she has been advised by surgeon,  surgery is high risk due to many factors, and surgeon will consult with her other providers as needed.    States she did receive resource information from [employer], has some questions regarding the information, and is planning to follow up with [place] in the near future.   RNCM advised will send [place] / Epic in-basket message regarding our conversation.  States she has already contacted Ludger Nutting at [facility] office, regarding [employer] and Attendance benefit, he was able to assist her with submitting  a phone application for this benefit.   States Herbie Baltimore is aware of her upcoming surgery, RNCM advised patient to follow up with Herbie Baltimore regarding expediting her application if possible due to upcoming surgery, patient voices understanding, and states she  will follow up if needed.  Discussed patient's recent ED visit, states she is currently receiving home health services RN, physical therapy, occupational therapy, and nursing daily check in calls.  States she continues to need housekeeping services, unable to pay out of pocket at this time, and is planning to follow up with home health nurse regarding possible home health aide services.  States she not able to clean her house like she used to in the past and this concerns her at times.  States she will continue to follow up on other community resources with [place] Management Social Worker.  States a friend brought her some groceries earlier today and assisted her with some home management activities.    Patient states she does not have any education material, transition of care, care coordination, disease management, disease monitoring, transportation, or pharmacy needs at this time.   States her providers are wonderful, providers nursing staff very nice,  able to answer all of her questions, they provider her with education as needed,  and she will notify [place] Management in the future if any other nursing needs as needed.  States she is very appreciative of the follow up and is in agreement to continue to receive [place] Management Social Worker services.    Objective:Per KPN (Knowledge Performance Now, point of care tool) and chart review,patient hospitalized 03/05/2019 - 03/06/2019 for rectal bleeding,Gastric ulcer,Rectal polyp,Adenomatous polyp of ascending colon, [facility] for preparation of EGD and colonoscopy on February 9 due to transportation issues.Patient also has a history ofanxiety, autoimmune hepatitis withsuspectedcirrhosis, bipolar 1  disorder, fibromyalgia, GERD, hypothyroidism, history of seizures last in 2016,Drug-induced Parkinsonism,Glaucoma,Heart attack,Pacemaker Medtronic,Paranoid schizophrenia,Polyarthritis, and stroke.     Assessment:  Received Medicare [place] follow up referral on 03/13/2019. Referral sources: [place]. Referral reason: care management/social work outreach. Medical conditions:internal bleeding, heart condition, hepatitis, arthritis.Screening follow upcompleted. Patient will continue to be followed by [place] Management Social Worker for the following: Medicaid application, housekeeping resources, and Advanced Directives documents completion.  No Telephonic RNCM needs at this time and will proceed with case closure to this RNCM.      Plan:RNCM will send [place] / Epic in-basket message regarding update on above conversation. RNCMwill proceed with case closure to this RNCM and send discipline case closure letter to primary MD.      Colbert Coyer. Annia Friendly, BSN, [place] Management [facility] Telephonic CM Phone: [phone] Fax: [phone]

## 2019-04-16 ENCOUNTER — Telehealth: Payer: Self-pay | Admitting: *Deleted

## 2019-04-16 DIAGNOSIS — M79601 Pain in right arm: Secondary | ICD-10-CM | POA: Diagnosis not present

## 2019-04-16 DIAGNOSIS — I252 Old myocardial infarction: Secondary | ICD-10-CM | POA: Diagnosis not present

## 2019-04-16 DIAGNOSIS — G8929 Other chronic pain: Secondary | ICD-10-CM | POA: Diagnosis not present

## 2019-04-16 DIAGNOSIS — F419 Anxiety disorder, unspecified: Secondary | ICD-10-CM | POA: Diagnosis not present

## 2019-04-16 DIAGNOSIS — M159 Polyosteoarthritis, unspecified: Secondary | ICD-10-CM | POA: Diagnosis not present

## 2019-04-16 DIAGNOSIS — I519 Heart disease, unspecified: Secondary | ICD-10-CM | POA: Diagnosis not present

## 2019-04-16 NOTE — Telephone Encounter (Signed)
I will send a message to [place]. To please reach to pt with an appt She is till in the 3 yr time frame.

## 2019-04-16 NOTE — Telephone Encounter (Signed)
Pt has been scheduled to see Any Chalmers Cater, [facility] 04/23/19 @ 10:55. I will send clearance notes to [facility] for upcoming appt. I will send FYI to Dr. Michael Boston pt has appt with cardiologist. I will remove from the pre op call back pool.

## 2019-04-16 NOTE — Telephone Encounter (Signed)
   [facility] Pre-operative Risk Assessment    Request for surgical clearance:  1. What type of surgery is being performed? TEM/PARTIAL PROCTECTOMY OF RECTAL MASS   2. When is this surgery scheduled? TBD   3. What type of clearance is required (medical clearance vs. Pharmacy clearance to hold med vs. Both)? MEDICAL AS WELL AS PACEMAKER CLEARANCE. I WILL SEND A MESSAGE TO DEVICE POOL.   4. Are there any medications that need to be held prior to surgery and how long? NONE LISTED   5. Practice name and name of physician performing surgery? [facility]; DR. Remo Lipps GROSS   6. What is your office phone number [phone]    7.   What is your office fax number [phone] ATTN: Illene Regulus. CMA  8.   Anesthesia type (None, local, MAC, general) ?  GENERAL   Stacey Velazquez 04/16/2019, 3:07 PM  _________________________________________________________________   (provider comments below)

## 2019-04-16 NOTE — Telephone Encounter (Signed)
   Primary Cardiologist: Dr. Caryl Comes  Chart reviewed as part of pre-operative protocol coverage. Patient has not been seen in our office since 12/2016. Therefore, she will require a follow-up visit in order to better assess preoperative cardiovascular risk.  Pre-op covering staff: - Please schedule appointment and call patient to inform them. - Please contact requesting surgeon's office via preferred method (i.e, phone, fax) to inform them of need for appointment prior to surgery.   Darreld Mclean, PA-C  04/16/2019, 3:46 PM

## 2019-04-17 DIAGNOSIS — D485 Neoplasm of uncertain behavior of skin: Secondary | ICD-10-CM | POA: Diagnosis not present

## 2019-04-17 DIAGNOSIS — L821 Other seborrheic keratosis: Secondary | ICD-10-CM | POA: Diagnosis not present

## 2019-04-17 DIAGNOSIS — D0439 Carcinoma in situ of skin of other parts of face: Secondary | ICD-10-CM | POA: Diagnosis not present

## 2019-04-17 DIAGNOSIS — B078 Other viral warts: Secondary | ICD-10-CM | POA: Diagnosis not present

## 2019-04-17 DIAGNOSIS — D044 Carcinoma in situ of skin of scalp and neck: Secondary | ICD-10-CM | POA: Diagnosis not present

## 2019-04-18 ENCOUNTER — Other Ambulatory Visit: Payer: Self-pay | Admitting: *Deleted

## 2019-04-18 ENCOUNTER — Encounter: Payer: Self-pay | Admitting: *Deleted

## 2019-04-18 DIAGNOSIS — M79601 Pain in right arm: Secondary | ICD-10-CM | POA: Diagnosis not present

## 2019-04-18 DIAGNOSIS — F419 Anxiety disorder, unspecified: Secondary | ICD-10-CM | POA: Diagnosis not present

## 2019-04-18 DIAGNOSIS — I519 Heart disease, unspecified: Secondary | ICD-10-CM | POA: Diagnosis not present

## 2019-04-18 DIAGNOSIS — G8929 Other chronic pain: Secondary | ICD-10-CM | POA: Diagnosis not present

## 2019-04-18 DIAGNOSIS — M159 Polyosteoarthritis, unspecified: Secondary | ICD-10-CM | POA: Diagnosis not present

## 2019-04-18 DIAGNOSIS — I252 Old myocardial infarction: Secondary | ICD-10-CM | POA: Diagnosis not present

## 2019-04-18 NOTE — Patient Outreach (Signed)
[facility]) Care Management  04/18/2019  Stacey Velazquez April 09, 1945 [id number]  CSW was able to make contact with patient today to follow-up regarding social work services and resources, as well as to ensure that patient received the packet of resource information mailed to her home by CSW.  Patient admitted to receiving the list of [place], the list of [place] and the list of Resources for Senior; however, denied receiving the Advanced Directives packet, which included the Living Will and [place] documents.  CSW encouraged patient to look through the packet again, as the Advanced Directives would have been mailed out with all of the other resource information.  Patient continued to deny receiving it, or having questions about any of the information received thus far.  CSW agreed to mail another Advanced Directives packet to patient's home today, then follow-up with patient again on Monday, April 30, 2019, around 9:00am, to ensure that she received it, as well as assist with completion.  Patient voiced understanding and was agreeable to this plan.  [name], BSW, MSW, LCSW  Licensed Education officer, [facility]  Mailing [place] [address], [place], [place] [address] Physical Address-[address], [place], [place] [address] Toll Free Main # [phone] Fax # [phone] Cell # [phone]  Office # [phone] [email or web address]

## 2019-04-19 ENCOUNTER — Telehealth: Payer: Self-pay | Admitting: Gastroenterology

## 2019-04-19 DIAGNOSIS — I252 Old myocardial infarction: Secondary | ICD-10-CM | POA: Diagnosis not present

## 2019-04-19 DIAGNOSIS — F419 Anxiety disorder, unspecified: Secondary | ICD-10-CM | POA: Diagnosis not present

## 2019-04-19 DIAGNOSIS — M79601 Pain in right arm: Secondary | ICD-10-CM | POA: Diagnosis not present

## 2019-04-19 DIAGNOSIS — M159 Polyosteoarthritis, unspecified: Secondary | ICD-10-CM | POA: Diagnosis not present

## 2019-04-19 DIAGNOSIS — I519 Heart disease, unspecified: Secondary | ICD-10-CM | POA: Diagnosis not present

## 2019-04-19 DIAGNOSIS — G8929 Other chronic pain: Secondary | ICD-10-CM | POA: Diagnosis not present

## 2019-04-19 NOTE — Telephone Encounter (Signed)
I am concerned about Stacey Velazquez. I think it would be safe to try Miralax if she is feeling constipated. However, I do not have another reason for her nausea and feel this should be fully evaluated. She would need an office visit and labs to include CMP, CBC, and lipase. I leave for vacation in a few minutes, so, please see if an app has any availability to see her tomorrow. If not, she should be seen by PCP, UCC, or ER to better unders tand what is causing the nausea and the beat way to treat it. Thank you.

## 2019-04-19 NOTE — Telephone Encounter (Signed)
Spoke to the patient who cannot really verbalized what she needs. From what I can gather, she cannot eat due to nausea and needs something for relief. She also stated when she does eat, she experiences abd pain. She originally asked if she could take Miralax to have a BM but reports she's eaten very little the past few days due to the nausea. Please advise?

## 2019-04-19 NOTE — Telephone Encounter (Signed)
Dr. Tarri Glenn be advised: Spoke to the patient who has to use transportation from volunteers and has to give notice well in advance. She is already scheduled to see Dr. Tarri Glenn on 05/01/2019 and stated she would rather wait for that appointment. She was really frustrated with being asked to come in on Monday and asked me to cancel the appointment. She was told she could take the Miralax and follow up. She was also told to go to the ED if nausea worsens or progresses to vomiting.

## 2019-04-19 NOTE — Telephone Encounter (Signed)
Noted! Thank you

## 2019-04-19 NOTE — Telephone Encounter (Signed)
Patient called stated she has not ha d a BM for days and is extremely nauseous. She is wondering if she can take miralax and mix it with water.

## 2019-04-20 NOTE — Progress Notes (Signed)
Electrophysiology Office Note Date: 04/23/2019  ID:  Stacey Velazquez, Stacey Velazquez [birth year], MRN [id number]  PCP: Rocco Serene, MD Primary Cardiologist: No primary care provider on file. Electrophysiologist: Virl Axe, MD   CC: Pacemaker follow-up  Stacey Velazquez is a 74 y.o. female seen today for Dr. Caryl Comes . she presents today for surgical clearance. Since last being seen in our clinic, the patient reports doing very well.  she denies chest pain, palpitations, PND, orthopnea, nausea, vomiting, dizziness, syncope, weight gain, or early satiety.  She has had rectal bleeding for > 12 months and has upcoming surgery for removal of rectal mass.  She has intermittent issues with peripheral edema that are overall well controlled. She has occasional DOE and bendopnea.   Device History: Medtronic Dual Chamber PPM implanted 12/29/2012 for syncope/SSS  Past Medical History:  Diagnosis Date  . Adenomatous polyp of ascending colon   . Anxiety   . Autoimmune hepatitis ([place])   . Bipolar 1 disorder ([place])   . Carpal tunnel syndrome   . Cutaneous horn   . Drug-induced Parkinsonism ([place])   . Fibromyalgia   . GERD (gastroesophageal reflux disease)   . Gingival abscess 08/26/2016  . Glaucoma 2003   . Heart attack ([place])   . Heart disease   . Hepatitis, autoimmune ([place]) 08/25/2014  . Hypotension   . Hypothyroidism   . Neutropenia ([place])   . Pacemaker Medtronic    MRI compatible  . Paranoid schizophrenia ([place])   . Polyarthritis   . Seizures ([place])    last sz 08/21/14  . Sinus arrest   . Stroke ([place])   . Vertigo    bvvp   Past Surgical History:  Procedure Laterality Date  . BIOPSY  03/06/2019   Procedure: BIOPSY;  Surgeon: Thornton Park, MD;  Location: [facility];  Service: Gastroenterology;;  EGD and COLON  . CATARACT EXTRACTION Left 2008  . COLONOSCOPY WITH PROPOFOL N/A 03/06/2019   Procedure: COLONOSCOPY WITH PROPOFOL;  Surgeon: Thornton Park, MD;  Location: [facility];  Service:  Gastroenterology;  Laterality: N/A;  . ESOPHAGOGASTRODUODENOSCOPY (EGD) WITH PROPOFOL N/A 03/06/2019   Procedure: ESOPHAGOGASTRODUODENOSCOPY (EGD) WITH PROPOFOL ;  Surgeon: Thornton Park, MD;  Location: [facility];  Service: Gastroenterology;  Laterality: N/A;  . KNEE SURGERY Left 1985  . PACEMAKER INSERTION  2014  . PARTIAL HYSTERECTOMY  1979  . POLYPECTOMY  03/06/2019   Procedure: POLYPECTOMY;  Surgeon: Thornton Park, MD;  Location: [facility];  Service: Gastroenterology;;    Current Outpatient Medications  Medication Sig Dispense Refill  . azathioprine (IMURAN) 100 MG tablet Take 1 tablet (100 mg total) by mouth daily. 30 tablet 3  . levothyroxine (SYNTHROID) 50 MCG tablet Take 50 mcg by mouth daily before breakfast.    . omeprazole (PRILOSEC) 20 MG capsule Take 20 mg by mouth daily.    . traMADol (ULTRAM) 50 MG tablet Take 50 mg by mouth every 4 (four) hours as needed (for pain.).     Marland Kitchen traMADol-acetaminophen (ULTRACET) 37.5-325 MG tablet Take 2 tablets by mouth as needed.     No current facility-administered medications for this visit.    Allergies:   Ambien [zolpidem tartrate], Amitriptyline, Ampicillin, Anaprox [naproxen sodium], Benadryl [diphenhydramine hcl (sleep)], Cetirizine & related, Cortizone-10 [hydrocortisone], Darvon [propoxyphene], Diazepam, Diflunisal, Duloxetine, Flexeril [cyclobenzaprine], Lactose intolerance (gi), Lidocaine, Meperidine and related, Metanx [l-methylfolate-algae-b12-b6], Metoclopramide, Morphine and related, Nuprin [ibuprofen], Oxycodone, Penicillins, Propoxyphene, Ranitidine, Vicodin [hydrocodone-acetaminophen], and Tylenol [acetaminophen]   Social History: Social History   Socioeconomic History  .  Marital status: Widowed    Spouse name: Not on file  . Number of children: Not on file  . Years of education: Not on file  . Highest education level: Not on file  Occupational History  . Not on file  Tobacco Use  . Smoking status: Former  Smoker    Quit date: 03/02/2003    Years since quitting: 16.1  . Smokeless tobacco: Never Used  Substance and Sexual Activity  . Alcohol use: No  . Drug use: No  . Sexual activity: Not on file  Other Topics Concern  . Not on file  Social History Narrative   Lives alone, "have friends and relatives that come and help me"   caffeine use- coffee -1 cup daily   Social Determinants of Health   Financial Resource Strain: Low Risk   . Difficulty of Paying Living Expenses: Not very hard  Food Insecurity: No Food Insecurity  . Worried About Charity fundraiser in the Last Year: Never true  . Ran Out of Food in the Last Year: Never true  Transportation Needs: Unmet Transportation Needs  . Lack of Transportation (Medical): Yes  . Lack of Transportation (Non-Medical): Yes  Physical Activity: Inactive  . Days of Exercise per Week: 0 days  . Minutes of Exercise per Session: 0 min  Stress: Stress Concern Present  . Feeling of Stress : To some extent  Social Connections: Somewhat Isolated  . Frequency of Communication with Friends and Family: More than three times a week  . Frequency of Social Gatherings with Friends and Family: More than three times a week  . Attends Religious Services: 1 to 4 times per year  . Active Member of Clubs or Organizations: No  . Attends Archivist Meetings: Never  . Marital Status: Widowed  Intimate Partner Violence: Not At Risk  . Fear of Current or Ex-Partner: No  . Emotionally Abused: No  . Physically Abused: No  . Sexually Abused: No    Family History: Family History  Problem Relation Age of Onset  . Heart attack Father   . Heart disease Paternal Uncle   . Heart disease Paternal Grandmother   . Diabetes Paternal Grandmother   . Heart disease Paternal Grandfather   . Breast cancer Mother   . Cancer Maternal Grandmother   . CVA Maternal Grandmother   . Cancer Maternal Grandfather   . CVA Maternal Grandfather   . Diabetes Sister   .  Diabetes Brother   . Diabetes Brother   . Stroke Daughter      Review of Systems: All other systems reviewed and are otherwise negative except as noted above.  Physical Exam: Vitals:   04/23/19 1044  BP: 128/72  Pulse: 67  SpO2: 99%  Weight: 199 lb (90.3 kg)  Height: 5\' 9"  (1.753 m)     GEN- The patient is well appearing, alert and oriented x 3 today.   HEENT: normocephalic, atraumatic; sclera clear, conjunctiva pink; hearing intact; oropharynx clear; neck supple  Lungs- Clear to ausculation bilaterally, normal work of breathing.  No wheezes, rales, rhonchi Heart- Regular rate and rhythm, no murmurs, rubs or gallops  GI- soft, non-tender, non-distended, bowel sounds present  Extremities- no clubbing, cyanosis, or edema  MS- no significant deformity or atrophy Skin- warm and dry, no rash or lesion; PPM pocket well healed Psych- euthymic mood, full affect Neuro- strength and sensation are intact  PPM Interrogation- reviewed in detail today,  See PACEART report  EKG:  EKG  is ordered today. The ekg ordered today shows NSR at 70 bpm, PR interval 190 ms, QRS 88 ms.  Recent Labs: 03/24/2019: ALT 55; BUN 10; Creatinine, Ser 0.73; Hemoglobin 13.4; Platelets 78; Potassium 3.8; Sodium 133   Wt Readings from Last 3 Encounters:  04/23/19 199 lb (90.3 kg)  03/24/19 199 lb 15.3 oz (90.7 kg)  03/06/19 200 lb (90.7 kg)     Other studies Reviewed: Additional studies/ records that were reviewed today include: Echo 12/2016 shows LVEF 50-55, Previous EP office notes, Previous remote checks, Most recent labwork.   Assessment and Plan:  1. Sinus arrest/syncope s/p Medtronic PPM  Normal PPM function See Pace Art report No changes today  2. H/o Chest pain typical/atypical features Normal echo 12/2016 No recent symptoms of CP No ischemia by stress test 12/2015  3. Thrombocytopenia Per PCP  4. Cardiac clearance for Tem/Partial proctectomy of rectal mass Echo 12/2016 LVEF  50-55% No s/s of ischemia She is cleared from a cardiac perspective to proceed with surgery with at least low risk of perioperative complications from a cardiac perspective with a Revised Cardiac Risk Index [place] Criteria) of at least 0.4%.  She will need additional clearance from Device Clinic once surgery scheduled and it is clear what position the patient will be in and what tools will be used (I.e. if electrocautery is expected)  Current medicines are reviewed at length with the patient today.   The patient does not have concerns regarding her medicines.  The following changes were made today:  none  Labs/ tests ordered today include:  Orders Placed This Encounter  Procedures  . CUP PACEART [place]  . EKG 12-Lead   Disposition:   Follow up with Dr. Caryl Comes in 12 Months    Signed, Annamaria Helling  04/23/2019 2:33 PM  [facility] [address] [place] [address] [phone] (office) [phone] (fax)

## 2019-04-23 ENCOUNTER — Ambulatory Visit: Payer: Medicare Other | Admitting: Physician Assistant

## 2019-04-23 ENCOUNTER — Other Ambulatory Visit: Payer: Self-pay

## 2019-04-23 ENCOUNTER — Ambulatory Visit (INDEPENDENT_AMBULATORY_CARE_PROVIDER_SITE_OTHER): Payer: Medicare Other | Admitting: Student

## 2019-04-23 ENCOUNTER — Encounter: Payer: Self-pay | Admitting: Student

## 2019-04-23 VITALS — BP 128/72 | HR 67 | Ht 69.0 in | Wt 199.0 lb

## 2019-04-23 DIAGNOSIS — I455 Other specified heart block: Secondary | ICD-10-CM

## 2019-04-23 DIAGNOSIS — I495 Sick sinus syndrome: Secondary | ICD-10-CM

## 2019-04-23 DIAGNOSIS — M159 Polyosteoarthritis, unspecified: Secondary | ICD-10-CM | POA: Diagnosis not present

## 2019-04-23 DIAGNOSIS — I519 Heart disease, unspecified: Secondary | ICD-10-CM | POA: Diagnosis not present

## 2019-04-23 DIAGNOSIS — F419 Anxiety disorder, unspecified: Secondary | ICD-10-CM | POA: Diagnosis not present

## 2019-04-23 DIAGNOSIS — I252 Old myocardial infarction: Secondary | ICD-10-CM | POA: Diagnosis not present

## 2019-04-23 DIAGNOSIS — K625 Hemorrhage of anus and rectum: Secondary | ICD-10-CM | POA: Diagnosis not present

## 2019-04-23 DIAGNOSIS — G8929 Other chronic pain: Secondary | ICD-10-CM | POA: Diagnosis not present

## 2019-04-23 DIAGNOSIS — M79601 Pain in right arm: Secondary | ICD-10-CM | POA: Diagnosis not present

## 2019-04-23 DIAGNOSIS — R55 Syncope and collapse: Secondary | ICD-10-CM

## 2019-04-23 LAB — CUP PACEART INCLINIC DEVICE CHECK
Battery Remaining Longevity: 56 mo
Battery Voltage: 2.98 V
Brady Statistic AP VP Percent: 0.02 %
Brady Statistic AP VS Percent: 9.33 %
Brady Statistic AS VP Percent: 0.24 %
Brady Statistic AS VS Percent: 90.4 %
Brady Statistic RA Percent Paced: 9.34 %
Brady Statistic RV Percent Paced: 0.27 %
Date Time Interrogation Session: 20210329122001
Implantable Lead Implant Date: 20141205
Implantable Lead Implant Date: 20141205
Implantable Lead Location: 753859
Implantable Lead Location: 753860
Implantable Lead Model: 5076
Implantable Lead Model: 5076
Implantable Pulse Generator Implant Date: 20141205
Lead Channel Impedance Value: 323 Ohm
Lead Channel Impedance Value: 380 Ohm
Lead Channel Impedance Value: 399 Ohm
Lead Channel Impedance Value: 437 Ohm
Lead Channel Pacing Threshold Amplitude: 0.5 V
Lead Channel Pacing Threshold Amplitude: 0.875 V
Lead Channel Pacing Threshold Pulse Width: 0.4 ms
Lead Channel Pacing Threshold Pulse Width: 0.4 ms
Lead Channel Sensing Intrinsic Amplitude: 2 mV
Lead Channel Sensing Intrinsic Amplitude: 2.5 mV
Lead Channel Sensing Intrinsic Amplitude: 3.25 mV
Lead Channel Sensing Intrinsic Amplitude: 5.375 mV
Lead Channel Setting Pacing Amplitude: 2 V
Lead Channel Setting Pacing Amplitude: 2.5 V
Lead Channel Setting Pacing Pulse Width: 0.4 ms
Lead Channel Setting Sensing Sensitivity: 0.6 mV

## 2019-04-23 NOTE — Patient Instructions (Signed)
Medication Instructions:  none *If you need a refill on your cardiac medications before your next appointment, please call your pharmacy*   Lab Work: none If you have labs (blood work) drawn today and your tests are completely normal, you will receive your results only by: Marland Kitchen MyChart Message (if you have MyChart) OR . A paper copy in the mail If you have any lab test that is abnormal or we need to change your treatment, we will call you to review the results.   Testing/Procedures: none   Follow-Up: At [facility], you and your health needs are our priority.  As part of our continuing mission to provide you with exceptional heart care, we have created designated Provider Care Teams.  These Care Teams include your primary Cardiologist (physician) and Advanced Practice Providers (APPs -  Physician Assistants and Nurse Practitioners) who all work together to provide you with the care you need, when you need it.  We recommend signing up for the patient portal called "MyChart".  Sign up information is provided on this After Visit Summary.  MyChart is used to connect with patients for Virtual Visits (Telemedicine).  Patients are able to view lab/test results, encounter notes, upcoming appointments, etc.  Non-urgent messages can be sent to your provider as well.   To learn more about what you can do with MyChart, go to [email or web address].    Your next appointment:   1 year(s)  The format for your next appointment:   Either In Person or Virtual  Provider:   Dr Caryl Comes   Other Instructions Remote monitoring is used to monitor your Pacemaker from home. This monitoring reduces the number of office visits required to check your device to one time per year. It allows [place] to keep an eye on the functioning of your device to ensure it is working properly. You are scheduled for a device check from home on 05/01/19. You may send your transmission at any time that day. If you have a wireless device,  the transmission will be sent automatically. After your physician reviews your transmission, you will receive a postcard with your next transmission date.

## 2019-04-24 DIAGNOSIS — M79601 Pain in right arm: Secondary | ICD-10-CM | POA: Diagnosis not present

## 2019-04-24 DIAGNOSIS — M159 Polyosteoarthritis, unspecified: Secondary | ICD-10-CM | POA: Diagnosis not present

## 2019-04-24 DIAGNOSIS — F419 Anxiety disorder, unspecified: Secondary | ICD-10-CM | POA: Diagnosis not present

## 2019-04-24 DIAGNOSIS — I252 Old myocardial infarction: Secondary | ICD-10-CM | POA: Diagnosis not present

## 2019-04-24 DIAGNOSIS — G8929 Other chronic pain: Secondary | ICD-10-CM | POA: Diagnosis not present

## 2019-04-24 DIAGNOSIS — I519 Heart disease, unspecified: Secondary | ICD-10-CM | POA: Diagnosis not present

## 2019-04-25 DIAGNOSIS — I252 Old myocardial infarction: Secondary | ICD-10-CM | POA: Diagnosis not present

## 2019-04-25 DIAGNOSIS — G8929 Other chronic pain: Secondary | ICD-10-CM | POA: Diagnosis not present

## 2019-04-25 DIAGNOSIS — M159 Polyosteoarthritis, unspecified: Secondary | ICD-10-CM | POA: Diagnosis not present

## 2019-04-25 DIAGNOSIS — F419 Anxiety disorder, unspecified: Secondary | ICD-10-CM | POA: Diagnosis not present

## 2019-04-25 DIAGNOSIS — M79601 Pain in right arm: Secondary | ICD-10-CM | POA: Diagnosis not present

## 2019-04-25 DIAGNOSIS — I519 Heart disease, unspecified: Secondary | ICD-10-CM | POA: Diagnosis not present

## 2019-04-26 DIAGNOSIS — E039 Hypothyroidism, unspecified: Secondary | ICD-10-CM | POA: Diagnosis not present

## 2019-04-26 DIAGNOSIS — M159 Polyosteoarthritis, unspecified: Secondary | ICD-10-CM | POA: Diagnosis not present

## 2019-04-26 DIAGNOSIS — K754 Autoimmune hepatitis: Secondary | ICD-10-CM | POA: Diagnosis not present

## 2019-04-26 DIAGNOSIS — Z950 Presence of cardiac pacemaker: Secondary | ICD-10-CM | POA: Diagnosis not present

## 2019-04-26 DIAGNOSIS — M150 Primary generalized (osteo)arthritis: Secondary | ICD-10-CM | POA: Diagnosis not present

## 2019-04-26 DIAGNOSIS — F419 Anxiety disorder, unspecified: Secondary | ICD-10-CM | POA: Diagnosis not present

## 2019-04-26 DIAGNOSIS — M79601 Pain in right arm: Secondary | ICD-10-CM | POA: Diagnosis not present

## 2019-04-26 DIAGNOSIS — M25321 Other instability, right elbow: Secondary | ICD-10-CM | POA: Diagnosis not present

## 2019-04-26 DIAGNOSIS — Z9181 History of falling: Secondary | ICD-10-CM | POA: Diagnosis not present

## 2019-04-26 DIAGNOSIS — H409 Unspecified glaucoma: Secondary | ICD-10-CM | POA: Diagnosis not present

## 2019-04-26 DIAGNOSIS — Z87891 Personal history of nicotine dependence: Secondary | ICD-10-CM | POA: Diagnosis not present

## 2019-04-26 DIAGNOSIS — G2119 Other drug induced secondary parkinsonism: Secondary | ICD-10-CM | POA: Diagnosis not present

## 2019-04-26 DIAGNOSIS — M797 Fibromyalgia: Secondary | ICD-10-CM | POA: Diagnosis not present

## 2019-04-26 DIAGNOSIS — G40909 Epilepsy, unspecified, not intractable, without status epilepticus: Secondary | ICD-10-CM | POA: Diagnosis not present

## 2019-04-26 DIAGNOSIS — F319 Bipolar disorder, unspecified: Secondary | ICD-10-CM | POA: Diagnosis not present

## 2019-04-26 DIAGNOSIS — I252 Old myocardial infarction: Secondary | ICD-10-CM | POA: Diagnosis not present

## 2019-04-26 DIAGNOSIS — Z8673 Personal history of transient ischemic attack (TIA), and cerebral infarction without residual deficits: Secondary | ICD-10-CM | POA: Diagnosis not present

## 2019-04-26 DIAGNOSIS — I519 Heart disease, unspecified: Secondary | ICD-10-CM | POA: Diagnosis not present

## 2019-04-26 DIAGNOSIS — R42 Dizziness and giddiness: Secondary | ICD-10-CM | POA: Diagnosis not present

## 2019-04-26 DIAGNOSIS — K219 Gastro-esophageal reflux disease without esophagitis: Secondary | ICD-10-CM | POA: Diagnosis not present

## 2019-04-26 DIAGNOSIS — F200 Paranoid schizophrenia: Secondary | ICD-10-CM | POA: Diagnosis not present

## 2019-04-26 DIAGNOSIS — G8929 Other chronic pain: Secondary | ICD-10-CM | POA: Diagnosis not present

## 2019-04-27 DIAGNOSIS — I519 Heart disease, unspecified: Secondary | ICD-10-CM | POA: Diagnosis not present

## 2019-04-27 DIAGNOSIS — M79601 Pain in right arm: Secondary | ICD-10-CM | POA: Diagnosis not present

## 2019-04-27 DIAGNOSIS — I252 Old myocardial infarction: Secondary | ICD-10-CM | POA: Diagnosis not present

## 2019-04-27 DIAGNOSIS — F419 Anxiety disorder, unspecified: Secondary | ICD-10-CM | POA: Diagnosis not present

## 2019-04-27 DIAGNOSIS — M159 Polyosteoarthritis, unspecified: Secondary | ICD-10-CM | POA: Diagnosis not present

## 2019-04-27 DIAGNOSIS — G8929 Other chronic pain: Secondary | ICD-10-CM | POA: Diagnosis not present

## 2019-04-30 ENCOUNTER — Other Ambulatory Visit: Payer: Self-pay | Admitting: *Deleted

## 2019-04-30 ENCOUNTER — Encounter: Payer: Self-pay | Admitting: *Deleted

## 2019-04-30 DIAGNOSIS — M79601 Pain in right arm: Secondary | ICD-10-CM | POA: Diagnosis not present

## 2019-04-30 DIAGNOSIS — M159 Polyosteoarthritis, unspecified: Secondary | ICD-10-CM | POA: Diagnosis not present

## 2019-04-30 DIAGNOSIS — G8929 Other chronic pain: Secondary | ICD-10-CM | POA: Diagnosis not present

## 2019-04-30 DIAGNOSIS — I519 Heart disease, unspecified: Secondary | ICD-10-CM | POA: Diagnosis not present

## 2019-04-30 DIAGNOSIS — F419 Anxiety disorder, unspecified: Secondary | ICD-10-CM | POA: Diagnosis not present

## 2019-04-30 DIAGNOSIS — I252 Old myocardial infarction: Secondary | ICD-10-CM | POA: Diagnosis not present

## 2019-04-30 NOTE — Patient Outreach (Signed)
[facility]) Care Management  04/30/2019  Stacey Velazquez 03/10/1945 [id number]  CSW was able to make contact with patient today to follow-up regarding social work services and resources, as well as to ensure that patient received the Advanced Directives packet (Living Will and [place] documents) that [facility] mailed to her home.  Patient confirmed receipt of the packet, but denied having had an opportunity to review the contents.  Patient admitted that she was up all night, experiencing symptoms of nausea, vomiting, stomach cramps and excruciating knee pain.  Patient went on to say that the pain medications (Tramadol 50 MG, PO, Q4 Hours, PRN and Tramadol 37.5-325 MG, PO, 2 Tablets, PO, PRN) are not eliminating her pain, only making her nauseated and constipated.  CSW encouraged patient to discuss her symptoms with her Primary Care Physician, Dr. Apolonio Schneiders, also physician prescribing patient's medications, to confirm that Dr. Arline Asp is aware.  CSW explained to patient that Dr. Arline Asp may want to change patient's pain medication regimen or prescribe something different.   Patient admitted to feeling a little anxious this morning, too preoccupied with upcoming surgical procedures to be able to concentrate on completing her Advanced Directives.  Patient inquired as to whether or not she would be able to contact CSW at a later time, once she has reviewed the Living Will and Hope documents, if she has specific questions or needs assistance with completion.  CSW voiced understanding and was agreeable to this plan.  Patient explained that she is awaiting medical clearance from her cardiologist before scheduling her surgical procedure to have a cancerous growth on her forehead removed.  Patient also has precancerous polyps in her large intestines that need to be surgically removed.  Once patient receives medical clearance, she will call to schedule her first  surgical procedure, understanding that she will be required to take 5 days of antibiotics before surgery.  Patient's application for Aid and Attendance Benefits, through [employer], has been submitted and is still pending approval.  CSW was able to provide counseling and supportive services to patient today, to try and help reduce and/or eliminate patient's symptoms of anxiety.  CSW also reminded patient to utilize deep breathing exercises and relaxation techniques when her symptoms become unmanageable or overwhelming.  CSW will perform a case closure on patient, as all goals of treatment have been met from social work standpoint and no additional social work needs have been identified at this time.  CSW will notify patient's Telephonic RNCM with [facility], Sonda Rumble of CSW's plans to close patient's case.  CSW will fax an update to patient's Primary Care Physician, Dr. Apolonio Schneiders to ensure that they are aware of CSW's involvement with patient's plan of care.  CSW was able to confirm that patient has the correct contact information for CSW, encouraging patient to contact CSW if additional social work needs arise in the near future, and if/when patient would like assistance with completion of Advanced Directives.    [name], BSW, MSW, LCSW  Licensed Education officer, [facility]  Mailing [place] [address] [place], [place] [address] Physical Address-[address], [place], [place] [address] Toll Free Main # [phone] Fax # [phone] Cell # [phone]  Office # [phone] [email or web address]

## 2019-05-01 ENCOUNTER — Ambulatory Visit (INDEPENDENT_AMBULATORY_CARE_PROVIDER_SITE_OTHER): Payer: Medicare Other | Admitting: Gastroenterology

## 2019-05-01 ENCOUNTER — Encounter: Payer: Self-pay | Admitting: Gastroenterology

## 2019-05-01 ENCOUNTER — Telehealth: Payer: Self-pay

## 2019-05-01 ENCOUNTER — Other Ambulatory Visit (INDEPENDENT_AMBULATORY_CARE_PROVIDER_SITE_OTHER): Payer: Medicare Other

## 2019-05-01 ENCOUNTER — Ambulatory Visit (INDEPENDENT_AMBULATORY_CARE_PROVIDER_SITE_OTHER): Payer: Medicare Other | Admitting: *Deleted

## 2019-05-01 VITALS — BP 120/80 | HR 80 | Temp 98.6°F

## 2019-05-01 DIAGNOSIS — I8500 Esophageal varices without bleeding: Secondary | ICD-10-CM | POA: Diagnosis not present

## 2019-05-01 DIAGNOSIS — K7469 Other cirrhosis of liver: Secondary | ICD-10-CM

## 2019-05-01 DIAGNOSIS — K625 Hemorrhage of anus and rectum: Secondary | ICD-10-CM | POA: Diagnosis not present

## 2019-05-01 DIAGNOSIS — R55 Syncope and collapse: Secondary | ICD-10-CM

## 2019-05-01 DIAGNOSIS — K754 Autoimmune hepatitis: Secondary | ICD-10-CM

## 2019-05-01 DIAGNOSIS — K621 Rectal polyp: Secondary | ICD-10-CM

## 2019-05-01 DIAGNOSIS — R748 Abnormal levels of other serum enzymes: Secondary | ICD-10-CM

## 2019-05-01 LAB — COMPREHENSIVE METABOLIC PANEL
ALT: 17 U/L (ref 0–35)
AST: 32 U/L (ref 0–37)
Albumin: 2.9 g/dL — ABNORMAL LOW (ref 3.5–5.2)
Alkaline Phosphatase: 72 U/L (ref 39–117)
BUN: 12 mg/dL (ref 6–23)
CO2: 25 mEq/L (ref 19–32)
Calcium: 8.5 mg/dL (ref 8.4–10.5)
Chloride: 104 mEq/L (ref 96–112)
Creatinine, Ser: 0.59 mg/dL (ref 0.40–1.20)
GFR: 99.41 mL/min (ref >60.00)
Glucose, Bld: 79 mg/dL (ref 70–99)
Potassium: 3.5 mEq/L (ref 3.5–5.1)
Sodium: 133 mEq/L — ABNORMAL LOW (ref 135–145)
Total Bilirubin: 1 mg/dL (ref 0.2–1.2)
Total Protein: 7.2 g/dL (ref 6.0–8.3)

## 2019-05-01 LAB — CBC WITH DIFFERENTIAL/PLATELET
Basophils Absolute: 0 10*3/uL (ref 0.0–0.1)
Basophils Relative: 0.8 % (ref 0.0–3.0)
Eosinophils Absolute: 0 10*3/uL (ref 0.0–0.7)
Eosinophils Relative: 0.8 % (ref 0.0–5.0)
HCT: 38.9 % (ref 36.0–46.0)
Hemoglobin: 13.1 g/dL (ref 12.0–15.0)
Lymphocytes Relative: 34.6 % (ref 12.0–46.0)
Lymphs Abs: 0.6 10*3/uL — ABNORMAL LOW (ref 0.7–4.0)
MCHC: 33.7 g/dL (ref 30.0–36.0)
MCV: 95.5 fl (ref 78.0–100.0)
Monocytes Absolute: 0.2 10*3/uL (ref 0.1–1.0)
Monocytes Relative: 8.4 % (ref 3.0–12.0)
Neutro Abs: 1 10*3/uL — ABNORMAL LOW (ref 1.4–7.7)
Neutrophils Relative %: 55.4 % (ref 43.0–77.0)
Platelets: 77 10*3/uL — ABNORMAL LOW (ref 150.0–400.0)
RBC: 4.08 Mil/uL (ref 3.87–5.11)
RDW: 16.7 % — ABNORMAL HIGH (ref 11.5–15.5)
WBC: 1.8 10*3/uL — CL (ref 4.0–10.5)

## 2019-05-01 LAB — CUP PACEART REMOTE DEVICE CHECK
Battery Remaining Longevity: 54 mo
Battery Voltage: 2.98 V
Brady Statistic AP VP Percent: 0.01 %
Brady Statistic AP VS Percent: 5.37 %
Brady Statistic AS VP Percent: 0.04 %
Brady Statistic AS VS Percent: 94.58 %
Brady Statistic RA Percent Paced: 5.38 %
Brady Statistic RV Percent Paced: 0.04 %
Date Time Interrogation Session: 20210406071229
Implantable Lead Implant Date: 20141205
Implantable Lead Implant Date: 20141205
Implantable Lead Location: 753859
Implantable Lead Location: 753860
Implantable Lead Model: 5076
Implantable Lead Model: 5076
Implantable Pulse Generator Implant Date: 20141205
Lead Channel Impedance Value: 323 Ohm
Lead Channel Impedance Value: 399 Ohm
Lead Channel Impedance Value: 399 Ohm
Lead Channel Impedance Value: 456 Ohm
Lead Channel Pacing Threshold Amplitude: 0.5 V
Lead Channel Pacing Threshold Amplitude: 0.875 V
Lead Channel Pacing Threshold Pulse Width: 0.4 ms
Lead Channel Pacing Threshold Pulse Width: 0.4 ms
Lead Channel Sensing Intrinsic Amplitude: 2.125 mV
Lead Channel Sensing Intrinsic Amplitude: 2.125 mV
Lead Channel Sensing Intrinsic Amplitude: 3.625 mV
Lead Channel Sensing Intrinsic Amplitude: 3.625 mV
Lead Channel Setting Pacing Amplitude: 2 V
Lead Channel Setting Pacing Amplitude: 2.5 V
Lead Channel Setting Pacing Pulse Width: 0.4 ms
Lead Channel Setting Sensing Sensitivity: 0.6 mV

## 2019-05-01 NOTE — Telephone Encounter (Signed)
error 

## 2019-05-01 NOTE — Progress Notes (Signed)
Referring Provider: Rocco Serene, MD Primary Care Physician:  Rocco Serene, MD  Chief complaint:  Rectal bleeding, change in stool caliber   IMPRESSION:  Rectal polyp presenting with bleeding andchange in stool caliber x 1 year    - Colonoscopy 03/06/19 showed a 20 mm polyp in the distal rectum adjacent to the dentate line       - Worrisome for advance polyp and/or adenocarcinoma        - Biopsies c/w tubulovillous adenoma with high-grade grandular dysplasia    - awaiting surgery with Dr. Johney Maine History of advanced colon polyps    - Multiple colonoscopies with tubular adenomas and tubulovillous adenomas    - Colonoscopy 2018 at [facility]: 57mm polyp in the ascending and at the hepatic flexure    - Surveillance colonoscopy recommended in 1 years    - History of 43mm distal rectal polyp on colonoscopy 03/06/19, 3 TAs, 2 HPs Cirrhosis due to AIH    - 2/21 labs show a MELD of 11, CPT score 7    - hypoalbuminemia, mild coagulopathy, and thrombocytopenia.  Autoimmune hepatitis (AIH) previously on azathioprine    - Diagnosed in 2006    - Liver biopsy 2006: Grade 3, stage I autoimmune hepatitis    - Previously on azathioprine and steroids    - Last seen in the [facility] hepatology clinic 2014    - Most recently treated by Dr. Benson Norway, last in 2018    - IgG 3501 02/27/19    - resumed azathioprine 100 mg QD 02/2019 Gastric ulcer on EGD 02/2019    - H pylori negative on biopsies    - follow-up recommended in 3 months Esophageal varices on EGD 02/10/16 Benson Norway)    - grade 2 esophageal varices on EGD 02/2019 Recent hypokalemia Uses a walker at home, wheelchair when out of her apartment Transportation issues  Rectal polyp versus mass with history of adenomas: This is the cause of her rectal bleeding. Awaiting surgery with Dr. Johney Maine.  Will determine surveillance interval after the polyp is removed as she also had 3 tubular adenomas and 2 hyperplastic polyps at the time of her last colonoscopy.   Autoimmune  hepatitis: Resumed azathioprine 2/21 after being off treatment for 2-3 years. Recommend close monitoring with monthly labs until she stabilizes.   Cirrhosis by labs and imaging: Continue screening for hepatocellular carcinoma recommended given her history of cirrhosis and esophageal varices. Proceed with cross-sectional screening in 6 months.   Esophageal varices:  Consider starting non-selective beta-blocker therapy on next visit if there are no obvious contraindication at that time.  Gastric ulcer: PPI BID. Consider repeat EGD in 3 months.   Recent hypokalemia: Will repeat K levels today.   PLAN: - Surgery with Dr. Johney Maine as planned - High fiber diet recommended, drink at least 1.5-2 liters of water - Continue daily stool bulking agent such as Metamucil - Abstain from all alcohol - Continue azathioprine 100 mg QD - Monthly labs: CMP, CBC, IgG  - including today as they are overdue - Semi-annual labs: CMP, CBC, AFP and PT/INR - Continue omeprazole 40 mg BID - Avoid NSAIDs - Add carvedilol in the future for esophageal varices - EGD in 2-3 months to follow-up on the gastric ulcer - MRI for hepatocellular carcinoma screening in August 2021 - Follow-up in this office in 4-6 weeks, earlier as needed  I spent over 50 minutes of time, including in depth chart review, independent review of results as outlined above, communicating results  with the patient directly, face-to-face time with the patient, coordinating care, ordering studies and medications as appropriate, and documentation.    HPI: LAXMI CHOUNG is a 74 y.o. female who returns in scheduled follow-up.  She was seen in consultation 02/27/19 for rectal bleeding and had endoscopic evaluation 03/06/19 revealing a distal rectal polyp worrisome for malignancy. She also has a history of cirrhosis due to autoimmune hepatitis.  The interval history is obtained through the patient and review of EPIC.  She has a seizure disorder, fibromyalgia, sick  sinus syndrome requiring a pacemaker 2014, colon polyps, aortic atherosclerosis, and paranoid schizophrenia.  She has previously been followed by the hepatology clinic at [facility] (last seen in 2014), the GI clinic at [facility] (last seen 2018), and by Dr. Carol Ada (last seen 12/13/16).   She presented with a one year of rectal bleeding with the feeling of blockage in the rectum preventing all of her stool to pass and a change in stool caliber to more liquid, thinner stools.  She moved to [facility] in 2014 and did not have a colonoscopy for several years.  A colonoscopy with Dr. Benson Norway 02/10/2016 showed 6 polyps.  4 polyps ranged from 3 to 6 mm were removed and the descending: And transverse colon.  There were 2 polyps ranging from 30 to 40 mm that were biopsied and tattooed in the ascending colon and transverse.  Pathology report showed tubulous villous adenoma of high-grade dysplasia.  The patient was referred to [facility].    Colonoscopy with Dr. Stephanie Acre at [facility] 05/26/16:  "The perianal and digital rectal examinations were normal.    A greater than 50 mm polyp was found in the proximal ascending colon.     The polyp was sessile. Preparations were made for mucosal resection.     Saline with methylene blue was injected to raise the lesion. Snare     mucosal resection was performed. Resection and retrieval were complete.     Coagulation for tissue destruction using snare was successful.    A 50 mm polyp was found in the hepatic flexure. The polyp was     semi-pedunculated. Preparations were made for mucosal resection. Saline     with methylene blue was injected to raise the lesion. Snare mucosal     resection was performed. Resection and retrieval were complete.     Coagulation for tissue destruction using snare was successful.    The exam was otherwise without abnormality on direct and retroflexion     views." Pathology revealed Tubulovillous adenoma (multiple fragments) with multiple foci of  high-grade dysplasia.  Surveillance colonoscopy recommended in 1 year.  She did not have a follow-up colonoscopy until 03/06/19.   Colonoscopy 03/06/19 showed a 20 mm polyp in the distal rectum adjacent to the dentate line. Worrisome for advance polyp and/or adenocarcinoma. Biopsies were consistent with tubulovillous adenoma with high-grade grandular dysplasia. She also had three 1 to 4 mm tubular adenomas and two hyperplastic polyps removed at that time.   She has seen Dr. Johney Maine in consultation and recently received cardiac clearance to proceed with surgery. She is now awaiting clearance from the device clinic.  She continues to have rectal bleeding attributed to the distal rectal polyp.   Also has a longstanding history of autoimmune hepatitis and was previously followed by the [facility] hepatology clinic.  Last visit 2014.  She was on azathioprine 100 mg daily and tapering off her steroids at that time.  A liver biopsy 2006 of grade 3 stage  I however I am unable to identify a date of the biopsy. More recently followed by Dr. Benson Norway for the autoimmune hepatitis. She has been off of medications since that time. She brings a copy of a letter from Dr. Benson Norway dated 02/14/19 that showed that she needed to have another EGD due to "evidence of cirrhosis" and "varices" seen on her EGD.  However, she was unaware of the diagnosis of cirrhosis.  There is no history of jaundice, ascites, hepatic encephalopathy, hepatorenal syndrome, or hepatopulmonary syndrome.  She resumed azathioprine at the time of her last visit with me 03/28/19. She is tolerating the medication without any complaint. Reports 100% adherence.  Continues to be overwhelmed by her overall decline this year.  No new GI complaints today.   Labs 02/24/15: IgG 3092 Labs 02/27/19: IgG 3501 Labs from 03/24/19 showed normal CMP except for Na 133, glucose 110, calcium 7.9, albumin 2.4, AST 67, ALT 55. WBC 4.4, hgb 13.4, platelets 78, MCV 94.5, INR 1.4.   She has had no  further laboratory studies since the end of February.  EGD 03/06/19 showed  Grade II varices were found in the middle third of the esophagus. Moderate portal hypertensive gastropathy was found in the gastric body. Localized mild inflammation characterized by erythema, friability and granularity was found in the gastric fundus. One non-bleeding cratered gastric ulcer with no stigmata of bleeding was found on the lesser curvature of the stomach. Biopsies confirmed gastritis. There was no H pylori.   An abdominal ultrasound from 02/19/2015 showed changes of cirrhosis with a subtle nodularity to the liver contours.  No focal abnormality.  Prior cholecystectomy.  Abdominal ultrasound 03/05/19 showed cirrhosis and splenomegaly. There was no ascites.     Past Medical History:  Diagnosis Date  . Adenomatous polyp of ascending colon   . Anxiety   . Autoimmune hepatitis ([place])   . Bipolar 1 disorder ([place])   . Carpal tunnel syndrome   . Cutaneous horn   . Drug-induced Parkinsonism ([place])   . Fibromyalgia   . GERD (gastroesophageal reflux disease)   . Gingival abscess 08/26/2016  . Glaucoma 2003   . Heart attack ([place])   . Heart disease   . Hepatitis, autoimmune ([place]) 08/25/2014  . Hypotension   . Hypothyroidism   . Neutropenia ([place])   . Pacemaker Medtronic    MRI compatible  . Paranoid schizophrenia ([place])   . Polyarthritis   . Seizures ([place])    last sz 08/21/14  . Sinus arrest   . Stroke ([place])   . Vertigo    bvvp    Past Surgical History:  Procedure Laterality Date  . BIOPSY  03/06/2019   Procedure: BIOPSY;  Surgeon: Thornton Park, MD;  Location: [facility];  Service: Gastroenterology;;  EGD and COLON  . CATARACT EXTRACTION Left 2008  . COLONOSCOPY WITH PROPOFOL N/A 03/06/2019   Procedure: COLONOSCOPY WITH PROPOFOL;  Surgeon: Thornton Park, MD;  Location: [facility];  Service: Gastroenterology;  Laterality: N/A;  . ESOPHAGOGASTRODUODENOSCOPY (EGD) WITH PROPOFOL N/A 03/06/2019    Procedure: ESOPHAGOGASTRODUODENOSCOPY (EGD) WITH PROPOFOL ;  Surgeon: Thornton Park, MD;  Location: [facility];  Service: Gastroenterology;  Laterality: N/A;  . KNEE SURGERY Left 1985  . PACEMAKER INSERTION  2014  . PARTIAL HYSTERECTOMY  1979  . POLYPECTOMY  03/06/2019   Procedure: POLYPECTOMY;  Surgeon: Thornton Park, MD;  Location: [facility];  Service: Gastroenterology;;    Current Outpatient Medications  Medication Sig Dispense Refill  . azathioprine (IMURAN) 100 MG tablet Take  1 tablet (100 mg total) by mouth daily. 30 tablet 3  . levothyroxine (SYNTHROID) 50 MCG tablet Take 50 mcg by mouth daily before breakfast.    . omeprazole (PRILOSEC) 20 MG capsule Take 20 mg by mouth daily.    . traMADol (ULTRAM) 50 MG tablet Take 50 mg by mouth every 4 (four) hours as needed (for pain.).     Marland Kitchen traMADol-acetaminophen (ULTRACET) 37.5-325 MG tablet Take 2 tablets by mouth as needed.     No current facility-administered medications for this visit.    Allergies as of 05/01/2019 - Review Complete 04/23/2019  Allergen Reaction Noted  . Ambien [zolpidem tartrate] Other (See Comments) 02/21/2013  . Amitriptyline Other (See Comments) 02/21/2013  . Ampicillin Other (See Comments) 02/21/2013  . Anaprox [naproxen sodium] Other (See Comments) 02/21/2013  . Benadryl [diphenhydramine hcl (sleep)] Other (See Comments) 02/21/2013  . Cetirizine & related Other (See Comments) 02/21/2013  . Cortizone-10 [hydrocortisone] Other (See Comments) 02/21/2013  . Darvon [propoxyphene] Other (See Comments) 02/21/2013  . Diazepam Other (See Comments) 02/21/2013  . Diflunisal Swelling and Other (See Comments) 02/21/2013  . Duloxetine Other (See Comments) 02/21/2013  . Flexeril [cyclobenzaprine] Other (See Comments) 02/21/2013  . Lactose intolerance (gi) Swelling and Other (See Comments) 08/23/2014  . Lidocaine Other (See Comments) 02/21/2013  . Meperidine and related Other (See Comments) 02/21/2013  .  Metanx [l-methylfolate-algae-b12-b6] Other (See Comments) 02/21/2013  . Metoclopramide Other (See Comments) 02/21/2013  . Morphine and related Other (See Comments) 02/21/2013  . Nuprin [ibuprofen] Other (See Comments) 02/21/2013  . Oxycodone Other (See Comments) 02/21/2013  . Penicillins Other (See Comments) 02/21/2013  . Propoxyphene Other (See Comments) 02/21/2013  . Ranitidine Other (See Comments) 02/21/2013  . Vicodin [hydrocodone-acetaminophen] Other (See Comments) 02/21/2013  . Tylenol [acetaminophen] Rash 02/21/2013    Family History  Problem Relation Age of Onset  . Heart attack Father   . Heart disease Paternal Uncle   . Heart disease Paternal Grandmother   . Diabetes Paternal Grandmother   . Heart disease Paternal Grandfather   . Breast cancer Mother   . Cancer Maternal Grandmother   . CVA Maternal Grandmother   . Cancer Maternal Grandfather   . CVA Maternal Grandfather   . Diabetes Sister   . Diabetes Brother   . Diabetes Brother   . Stroke Daughter     Social History   Socioeconomic History  . Marital status: Widowed    Spouse name: Not on file  . Number of children: Not on file  . Years of education: Not on file  . Highest education level: Not on file  Occupational History  . Not on file  Tobacco Use  . Smoking status: Former Smoker    Quit date: 03/02/2003    Years since quitting: 16.1  . Smokeless tobacco: Never Used  Substance and Sexual Activity  . Alcohol use: No  . Drug use: No  . Sexual activity: Not on file  Other Topics Concern  . Not on file  Social History Narrative   Lives alone, "have friends and relatives that come and help me"   caffeine use- coffee -1 cup daily   Social Determinants of Health   Financial Resource Strain: Low Risk   . Difficulty of Paying Living Expenses: Not very hard  Food Insecurity: No Food Insecurity  . Worried About Charity fundraiser in the Last Year: Never true  . Ran Out of Food in the Last Year: Never  true  Transportation Needs: Unmet Transportation  Needs  . Lack of Transportation (Medical): Yes  . Lack of Transportation (Non-Medical): Yes  Physical Activity: Inactive  . Days of Exercise per Week: 0 days  . Minutes of Exercise per Session: 0 min  Stress: Stress Concern Present  . Feeling of Stress : To some extent  Social Connections: Somewhat Isolated  . Frequency of Communication with Friends and Family: More than three times a week  . Frequency of Social Gatherings with Friends and Family: More than three times a week  . Attends Religious Services: 1 to 4 times per year  . Active Member of Clubs or Organizations: No  . Attends Archivist Meetings: Never  . Marital Status: Widowed  Intimate Partner Violence: Not At Risk  . Fear of Current or Ex-Partner: No  . Emotionally Abused: No  . Physically Abused: No  . Sexually Abused: No    Physical Exam: General:   Alert,  well-nourished, pleasant and cooperative in NAD. Sitting in a wheelchair. Right arm in a sling.  Heart:  Regular rate and rhythm; no murmurs Pulm: Clear anteriorly; no wheezing Abdomen:  Soft. Central obesity. Nontender. Nondistended. Normal bowel sounds. No rebound or guarding. No fluid wave.  LAD: No inguinal or umbilical LAD Extremities:  Without edema. Neurologic:  Alert and  oriented x4;  grossly normal neurologically; no asterixis or clonus. Skin: No jaundice. Palmar erythema. Spider angioma on the chest wall.   Psych:  Alert and cooperative. Normal mood and affect.     Kadey Mihalic L. Tarri Glenn, MD, MPH 05/01/2019, 1:57 PM

## 2019-05-01 NOTE — Patient Instructions (Addendum)
Surgery with Dr. Johney Maine as planned   High fiber diet recommended, drink at least 1.5-2 liters of water  Continue daily stool bulking agent such as Metamucil  Continue azathioprine 100 mg daily  Please continue to have Monthly labs  Continue omeprazole 40 mg twice a day  Avoid NSAIDs

## 2019-05-02 ENCOUNTER — Other Ambulatory Visit: Payer: Self-pay

## 2019-05-02 DIAGNOSIS — I252 Old myocardial infarction: Secondary | ICD-10-CM | POA: Diagnosis not present

## 2019-05-02 DIAGNOSIS — K7469 Other cirrhosis of liver: Secondary | ICD-10-CM

## 2019-05-02 DIAGNOSIS — M159 Polyosteoarthritis, unspecified: Secondary | ICD-10-CM | POA: Diagnosis not present

## 2019-05-02 DIAGNOSIS — I519 Heart disease, unspecified: Secondary | ICD-10-CM | POA: Diagnosis not present

## 2019-05-02 DIAGNOSIS — M79601 Pain in right arm: Secondary | ICD-10-CM | POA: Diagnosis not present

## 2019-05-02 DIAGNOSIS — F419 Anxiety disorder, unspecified: Secondary | ICD-10-CM | POA: Diagnosis not present

## 2019-05-02 DIAGNOSIS — G8929 Other chronic pain: Secondary | ICD-10-CM | POA: Diagnosis not present

## 2019-05-02 LAB — IGG: IgG (Immunoglobin G), Serum: 3164 mg/dL — ABNORMAL HIGH (ref 600–1540)

## 2019-05-02 NOTE — Progress Notes (Signed)
PPM Remote  

## 2019-05-03 DIAGNOSIS — F419 Anxiety disorder, unspecified: Secondary | ICD-10-CM | POA: Diagnosis not present

## 2019-05-03 DIAGNOSIS — I519 Heart disease, unspecified: Secondary | ICD-10-CM | POA: Diagnosis not present

## 2019-05-03 DIAGNOSIS — G8929 Other chronic pain: Secondary | ICD-10-CM | POA: Diagnosis not present

## 2019-05-03 DIAGNOSIS — M159 Polyosteoarthritis, unspecified: Secondary | ICD-10-CM | POA: Diagnosis not present

## 2019-05-03 DIAGNOSIS — M79601 Pain in right arm: Secondary | ICD-10-CM | POA: Diagnosis not present

## 2019-05-03 DIAGNOSIS — I252 Old myocardial infarction: Secondary | ICD-10-CM | POA: Diagnosis not present

## 2019-05-07 ENCOUNTER — Other Ambulatory Visit (HOSPITAL_COMMUNITY)
Admission: RE | Admit: 2019-05-07 | Discharge: 2019-05-07 | Disposition: A | Discharge status: Home / Self Care | Payer: Medicare Other | Source: Ambulatory Visit | Attending: Surgery | Admitting: Surgery

## 2019-05-07 ENCOUNTER — Encounter (HOSPITAL_COMMUNITY): Payer: Self-pay

## 2019-05-07 ENCOUNTER — Encounter: Payer: Self-pay | Admitting: Internal Medicine

## 2019-05-07 DIAGNOSIS — G2119 Other drug induced secondary parkinsonism: Secondary | ICD-10-CM | POA: Diagnosis not present

## 2019-05-07 DIAGNOSIS — Z880 Allergy status to penicillin: Secondary | ICD-10-CM | POA: Diagnosis not present

## 2019-05-07 DIAGNOSIS — Z888 Allergy status to other drugs, medicaments and biological substances status: Secondary | ICD-10-CM | POA: Diagnosis not present

## 2019-05-07 DIAGNOSIS — E039 Hypothyroidism, unspecified: Secondary | ICD-10-CM | POA: Diagnosis not present

## 2019-05-07 DIAGNOSIS — K7460 Unspecified cirrhosis of liver: Secondary | ICD-10-CM | POA: Diagnosis not present

## 2019-05-07 DIAGNOSIS — K754 Autoimmune hepatitis: Secondary | ICD-10-CM | POA: Diagnosis present

## 2019-05-07 DIAGNOSIS — Z9842 Cataract extraction status, left eye: Secondary | ICD-10-CM | POA: Diagnosis not present

## 2019-05-07 DIAGNOSIS — F200 Paranoid schizophrenia: Secondary | ICD-10-CM | POA: Diagnosis not present

## 2019-05-07 DIAGNOSIS — F319 Bipolar disorder, unspecified: Secondary | ICD-10-CM | POA: Diagnosis not present

## 2019-05-07 DIAGNOSIS — G8929 Other chronic pain: Secondary | ICD-10-CM | POA: Diagnosis not present

## 2019-05-07 DIAGNOSIS — Z20822 Contact with and (suspected) exposure to covid-19: Secondary | ICD-10-CM | POA: Diagnosis not present

## 2019-05-07 DIAGNOSIS — K643 Fourth degree hemorrhoids: Secondary | ICD-10-CM | POA: Diagnosis not present

## 2019-05-07 DIAGNOSIS — I252 Old myocardial infarction: Secondary | ICD-10-CM | POA: Diagnosis not present

## 2019-05-07 DIAGNOSIS — N9089 Other specified noninflammatory disorders of vulva and perineum: Secondary | ICD-10-CM | POA: Diagnosis not present

## 2019-05-07 DIAGNOSIS — Z01812 Encounter for preprocedural laboratory examination: Secondary | ICD-10-CM | POA: Insufficient documentation

## 2019-05-07 DIAGNOSIS — Z881 Allergy status to other antibiotic agents status: Secondary | ICD-10-CM | POA: Diagnosis not present

## 2019-05-07 DIAGNOSIS — Z885 Allergy status to narcotic agent status: Secondary | ICD-10-CM | POA: Diagnosis not present

## 2019-05-07 DIAGNOSIS — E669 Obesity, unspecified: Secondary | ICD-10-CM | POA: Diagnosis not present

## 2019-05-07 DIAGNOSIS — Z6831 Body mass index (BMI) 31.0-31.9, adult: Secondary | ICD-10-CM | POA: Diagnosis not present

## 2019-05-07 DIAGNOSIS — D6959 Other secondary thrombocytopenia: Secondary | ICD-10-CM | POA: Diagnosis present

## 2019-05-07 DIAGNOSIS — K51411 Inflammatory polyps of colon with rectal bleeding: Secondary | ICD-10-CM | POA: Diagnosis not present

## 2019-05-07 DIAGNOSIS — F419 Anxiety disorder, unspecified: Secondary | ICD-10-CM | POA: Diagnosis not present

## 2019-05-07 DIAGNOSIS — G40909 Epilepsy, unspecified, not intractable, without status epilepticus: Secondary | ICD-10-CM | POA: Diagnosis not present

## 2019-05-07 DIAGNOSIS — Z85828 Personal history of other malignant neoplasm of skin: Secondary | ICD-10-CM | POA: Diagnosis not present

## 2019-05-07 DIAGNOSIS — K625 Hemorrhage of anus and rectum: Secondary | ICD-10-CM | POA: Diagnosis not present

## 2019-05-07 DIAGNOSIS — Z950 Presence of cardiac pacemaker: Secondary | ICD-10-CM | POA: Diagnosis not present

## 2019-05-07 DIAGNOSIS — H409 Unspecified glaucoma: Secondary | ICD-10-CM | POA: Diagnosis present

## 2019-05-07 DIAGNOSIS — M159 Polyosteoarthritis, unspecified: Secondary | ICD-10-CM | POA: Diagnosis not present

## 2019-05-07 DIAGNOSIS — K219 Gastro-esophageal reflux disease without esophagitis: Secondary | ICD-10-CM | POA: Diagnosis not present

## 2019-05-07 DIAGNOSIS — M79601 Pain in right arm: Secondary | ICD-10-CM | POA: Diagnosis not present

## 2019-05-07 DIAGNOSIS — Z9049 Acquired absence of other specified parts of digestive tract: Secondary | ICD-10-CM | POA: Diagnosis not present

## 2019-05-07 DIAGNOSIS — D128 Benign neoplasm of rectum: Secondary | ICD-10-CM | POA: Diagnosis not present

## 2019-05-07 DIAGNOSIS — I519 Heart disease, unspecified: Secondary | ICD-10-CM | POA: Diagnosis not present

## 2019-05-07 LAB — SARS CORONAVIRUS 2 (TAT 6-24 HRS): SARS Coronavirus 2: NEGATIVE

## 2019-05-07 NOTE — Progress Notes (Signed)
[facility] DEVICE PROGRAMMING  Patient Information: Name:  Stacey Velazquez  DOB:  Apr 02, 1945  MRN:  [id number]    Planned Procedure: Partial proctectomy  Surgeon: Dr. Clyda Greener  Date of Procedure: 05/10/2019  Cautery will be used.  Position during surgery:    Please send documentation back to:  [facility] (Fax # [phone])   Device Information:  Clinic EP Physician:  Virl Axe, MD  Device Type:  Pacemaker Manufacturer and Phone #:  Medtronic: [id number] Pacemaker Dependent?:  No. Date of Last Device Check:  05/01/2019 Normal Device Function?:  Yes.    Electrophysiologist's Recommendations:   Have magnet available.  Provide continuous ECG monitoring when magnet is used or reprogramming is to be performed.   Procedure should not interfere with device function.  No device programming or magnet placement needed.  Per Device Clinic [address], [place], [place]  5:15 PM 05/07/2019

## 2019-05-07 NOTE — Patient Instructions (Signed)
DUE TO COVID-19 ONLY TWO VISITORS ARE ALLOWED TO COME WITH YOU AND STAY IN THE WAITING ROOM ONLY DURING PRE OP AND PROCEDURE. THE TWO VISITORS MAY VISIT WITH YOU IN YOUR PRIVATE ROOM DURING VISITING HOURS ONLY!!   COVID SWAB TESTING COMPLETED ON:    Monday, May 07, 2019 (Must self quarantine after testing. Follow instructions on handout.)             Your procedure is scheduled on: Thursday, May 10, 2019   Report to [facility] Main  Entrance    Report to admitting at 11:30 AM   Call this number if you have problems the morning of surgery [phone]   Increase oral fluids the day of prep to prevent dehydration   Dulcolax: Take the day before surgery   Miralax:  Mix with 64 oz Gatorade/Powerade. Drink gradually over the next few hours (8 oz glass every 15-30 minutes) until gone the day prior to surgery.    Neomycin/Metronidazole:  At 2 pm, 3 pm and 10 pm after Miralax bowel prep the day prior to surgery.   Drink 2 Ensure drinks the night before surgery.     Do not eat food :After Midnight.   May have liquids until 10:30 AM day of surgery   CLEAR LIQUID DIET  Foods Allowed                                                                     Foods Excluded  Water, Black Coffee and tea, regular and decaf                             liquids that you cannot  Plain Jell-O in any flavor  (No red)                                           see through such as: Fruit ices (not with fruit pulp)                                     milk, soups, orange juice  Iced Popsicles (No red)                                    All solid food Carbonated beverages, regular and diet                                    Apple juices Sports drinks like Gatorade (No red) Lightly seasoned clear broth or consume(fat free) Sugar, honey syrup  Sample Menu Breakfast                                Lunch  Supper Cranberry juice                    Beef broth                             Chicken broth Jell-O                                     Grape juice                           Apple juice Coffee or tea                        Jell-O                                      Popsicle                                                Coffee or tea                        Coffee or tea   Complete one Ensure drink the morning of surgery at  10:30 AM the day of surgery.   Oral Hygiene is also important to reduce your risk of infection.                                    Remember - BRUSH YOUR TEETH THE MORNING OF SURGERY WITH YOUR REGULAR TOOTHPASTE   Do NOT smoke after Midnight   Take these medicines the morning of surgery with A SIP OF WATER: Imuran, Levothyroxine, Omeprazole              You may not have any metal on your body including hair pins, jewelry, and body piercings             Do not wear make-up, lotions, powders, perfumes/cologne, or deodorant             Do not wear nail polish.  Do not shave  48 hours prior to surgery.                Do not bring valuables to the hospital. [place].   Contacts, dentures or bridgework may not be worn into surgery.   Bring small overnight bag day of surgery.    Special Instructions: Bring a copy of your healthcare power of attorney and living will documents         the day of surgery if you haven't scanned them in before.              Please read over the following fact sheets you were given: IF YOU HAVE QUESTIONS ABOUT YOUR PRE OP INSTRUCTIONS PLEASE CALL [phone]   [facility] - Preparing for Surgery Before surgery, you can play an important role.  Because skin is not sterile,  your skin needs to be as free of germs as possible.  You can reduce the number of germs on your skin by washing with CHG (chlorahexidine gluconate) soap before surgery.  CHG is an antiseptic cleaner which kills germs and bonds with the skin to continue killing germs even after  washing. Please DO NOT use if you have an allergy to CHG or antibacterial soaps.  If your skin becomes reddened/irritated stop using the CHG and inform your nurse when you arrive at Short Stay. Do not shave (including legs and underarms) for at least 48 hours prior to the first CHG shower.  You may shave your face/neck.  Please follow these instructions carefully:  1.  Shower with CHG Soap the night before surgery and the  morning of surgery.  2.  If you choose to wash your hair, wash your hair first as usual with your normal  shampoo.  3.  After you shampoo, rinse your hair and body thoroughly to remove the shampoo.                             4.  Use CHG as you would any other liquid soap.  You can apply chg directly to the skin and wash.  Gently with a scrungie or clean washcloth.  5.  Apply the CHG Soap to your body ONLY FROM THE NECK DOWN.   Do   not use on face/ open                           Wound or open sores. Avoid contact with eyes, ears mouth and   genitals (private parts).                       Wash face,  Genitals (private parts) with your normal soap.             6.  Wash thoroughly, paying special attention to the area where your    surgery  will be performed.  7.  Thoroughly rinse your body with warm water from the neck down.  8.  DO NOT shower/wash with your normal soap after using and rinsing off the CHG Soap.                9.  Pat yourself dry with a clean towel.            10.  Wear clean pajamas.            11.  Place clean sheets on your bed the night of your first shower and do not  sleep with pets. Day of Surgery : Do not apply any lotions/deodorants the morning of surgery.  Please wear clean clothes to the hospital/surgery center.  FAILURE TO FOLLOW THESE INSTRUCTIONS MAY RESULT IN THE CANCELLATION OF YOUR SURGERY  PATIENT SIGNATURE_________________________________  NURSE  SIGNATURE__________________________________  ________________________________________________________________________   Stacey Velazquez  An incentive spirometer is a tool that can help keep your lungs clear and active. This tool measures how well you are filling your lungs with each breath. Taking long deep breaths may help reverse or decrease the chance of developing breathing (pulmonary) problems (especially infection) following:  A long period of time when you are unable to move or be active. BEFORE THE PROCEDURE   If the spirometer includes an indicator to show your best effort, your nurse or respiratory therapist will set it  to a desired goal.  If possible, sit up straight or lean slightly forward. Try not to slouch.  Hold the incentive spirometer in an upright position. INSTRUCTIONS FOR USE  1. Sit on the edge of your bed if possible, or sit up as far as you can in bed or on a chair. 2. Hold the incentive spirometer in an upright position. 3. Breathe out normally. 4. Place the mouthpiece in your mouth and seal your lips tightly around it. 5. Breathe in slowly and as deeply as possible, raising the piston or the ball toward the top of the column. 6. Hold your breath for 3-5 seconds or for as long as possible. Allow the piston or ball to fall to the bottom of the column. 7. Remove the mouthpiece from your mouth and breathe out normally. 8. Rest for a few seconds and repeat Steps 1 through 7 at least 10 times every 1-2 hours when you are awake. Take your time and take a few normal breaths between deep breaths. 9. The spirometer may include an indicator to show your best effort. Use the indicator as a goal to work toward during each repetition. 10. After each set of 10 deep breaths, practice coughing to be sure your lungs are clear. If you have an incision (the cut made at the time of surgery), support your incision when coughing by placing a pillow or rolled up towels firmly  against it. Once you are able to get out of bed, walk around indoors and cough well. You may stop using the incentive spirometer when instructed by your caregiver.  RISKS AND COMPLICATIONS  Take your time so you do not get dizzy or light-headed.  If you are in pain, you may need to take or ask for pain medication before doing incentive spirometry. It is harder to take a deep breath if you are having pain. AFTER USE  Rest and breathe slowly and easily.  It can be helpful to keep track of a log of your progress. Your caregiver can provide you with a simple table to help with this. If you are using the spirometer at home, follow these instructions: [place] IF:   You are having difficultly using the spirometer.  You have trouble using the spirometer as often as instructed.  Your pain medication is not giving enough relief while using the spirometer.  You develop fever of 100.5 F (38.1 C) or higher. SEEK IMMEDIATE MEDICAL CARE IF:   You cough up bloody sputum that had not been present before.  You develop fever of 102 F (38.9 C) or greater.  You develop worsening pain at or near the incision site. MAKE SURE YOU:   Understand these instructions.  Will watch your condition.  Will get help right away if you are not doing well or get worse. Document Released: 05/24/2006 Document Revised: 04/05/2011 Document Reviewed: 07/25/2006 ExitCare Patient Information 2014 ExitCare, [place].   ________________________________________________________________________  WHAT IS A BLOOD TRANSFUSION? Blood Transfusion Information  A transfusion is the replacement of blood or some of its parts. Blood is made up of multiple cells which provide different functions.  Red blood cells carry oxygen and are used for blood loss replacement.  White blood cells fight against infection.  Platelets control bleeding.  Plasma helps clot blood.  Other blood products are available for  specialized needs, such as hemophilia or other clotting disorders. BEFORE THE TRANSFUSION  Who gives blood for transfusions?   Healthy volunteers who are fully evaluated to  make sure their blood is safe. This is blood bank blood. Transfusion therapy is the safest it has ever been in the practice of medicine. Before blood is taken from a donor, a complete history is taken to make sure that person has no history of diseases nor engages in risky social behavior (examples are intravenous drug use or sexual activity with multiple partners). The donor's travel history is screened to minimize risk of transmitting infections, such as malaria. The donated blood is tested for signs of infectious diseases, such as HIV and hepatitis. The blood is then tested to be sure it is compatible with you in order to minimize the chance of a transfusion reaction. If you or a relative donates blood, this is often done in anticipation of surgery and is not appropriate for emergency situations. It takes many days to process the donated blood. RISKS AND COMPLICATIONS Although transfusion therapy is very safe and saves many lives, the main dangers of transfusion include:   Getting an infectious disease.  Developing a transfusion reaction. This is an allergic reaction to something in the blood you were given. Every precaution is taken to prevent this. The decision to have a blood transfusion has been considered carefully by your caregiver before blood is given. Blood is not given unless the benefits outweigh the risks. AFTER THE TRANSFUSION  Right after receiving a blood transfusion, you will usually feel much better and more energetic. This is especially true if your red blood cells have gotten low (anemic). The transfusion raises the level of the red blood cells which carry oxygen, and this usually causes an energy increase.  The nurse administering the transfusion will monitor you carefully for complications. HOME CARE  INSTRUCTIONS  No special instructions are needed after a transfusion. You may find your energy is better. Speak with your caregiver about any limitations on activity for underlying diseases you may have. SEEK MEDICAL CARE IF:   Your condition is not improving after your transfusion.  You develop redness or irritation at the intravenous (IV) site. SEEK IMMEDIATE MEDICAL CARE IF:  Any of the following symptoms occur over the next 12 hours:  Shaking chills.  You have a temperature by mouth above 102 F (38.9 C), not controlled by medicine.  Chest, back, or muscle pain.  People around you feel you are not acting correctly or are confused.  Shortness of breath or difficulty breathing.  Dizziness and fainting.  You get a rash or develop hives.  You have a decrease in urine output.  Your urine turns a dark color or changes to pink, red, or brown. Any of the following symptoms occur over the next 10 days:  You have a temperature by mouth above 102 F (38.9 C), not controlled by medicine.  Shortness of breath.  Weakness after normal activity.  The white part of the eye turns yellow (jaundice).  You have a decrease in the amount of urine or are urinating less often.  Your urine turns a dark color or changes to pink, red, or brown. Document Released: 01/09/2000 Document Revised: 04/05/2011 Document Reviewed: 08/28/2007 [facility] Patient Information 2014 [place], [place].  _______________________________________________________________________

## 2019-05-08 ENCOUNTER — Encounter (HOSPITAL_COMMUNITY)
Admission: RE | Admit: 2019-05-08 | Discharge: 2019-05-08 | Disposition: A | Discharge status: Home / Self Care | Payer: Medicare Other | Source: Ambulatory Visit | Attending: Surgery | Admitting: Surgery

## 2019-05-08 ENCOUNTER — Other Ambulatory Visit: Payer: Self-pay

## 2019-05-08 ENCOUNTER — Inpatient Hospital Stay (HOSPITAL_COMMUNITY)
Admission: RE | Admit: 2019-05-08 | Discharge: 2019-05-08 | Disposition: A | Discharge status: Home / Self Care | Payer: Medicare Other | Source: Ambulatory Visit | Attending: Surgery | Admitting: Surgery

## 2019-05-08 ENCOUNTER — Encounter (HOSPITAL_COMMUNITY): Payer: Self-pay

## 2019-05-08 DIAGNOSIS — I519 Heart disease, unspecified: Secondary | ICD-10-CM | POA: Diagnosis not present

## 2019-05-08 DIAGNOSIS — M79601 Pain in right arm: Secondary | ICD-10-CM | POA: Diagnosis not present

## 2019-05-08 DIAGNOSIS — G8929 Other chronic pain: Secondary | ICD-10-CM | POA: Diagnosis not present

## 2019-05-08 DIAGNOSIS — Z87891 Personal history of nicotine dependence: Secondary | ICD-10-CM | POA: Insufficient documentation

## 2019-05-08 DIAGNOSIS — Z01812 Encounter for preprocedural laboratory examination: Secondary | ICD-10-CM | POA: Insufficient documentation

## 2019-05-08 DIAGNOSIS — I495 Sick sinus syndrome: Secondary | ICD-10-CM | POA: Insufficient documentation

## 2019-05-08 DIAGNOSIS — Z950 Presence of cardiac pacemaker: Secondary | ICD-10-CM | POA: Insufficient documentation

## 2019-05-08 DIAGNOSIS — E039 Hypothyroidism, unspecified: Secondary | ICD-10-CM | POA: Insufficient documentation

## 2019-05-08 DIAGNOSIS — D696 Thrombocytopenia, unspecified: Secondary | ICD-10-CM | POA: Insufficient documentation

## 2019-05-08 DIAGNOSIS — K219 Gastro-esophageal reflux disease without esophagitis: Secondary | ICD-10-CM | POA: Insufficient documentation

## 2019-05-08 DIAGNOSIS — I252 Old myocardial infarction: Secondary | ICD-10-CM | POA: Diagnosis not present

## 2019-05-08 DIAGNOSIS — Z8673 Personal history of transient ischemic attack (TIA), and cerebral infarction without residual deficits: Secondary | ICD-10-CM | POA: Insufficient documentation

## 2019-05-08 DIAGNOSIS — Z79899 Other long term (current) drug therapy: Secondary | ICD-10-CM | POA: Insufficient documentation

## 2019-05-08 DIAGNOSIS — K51411 Inflammatory polyps of colon with rectal bleeding: Secondary | ICD-10-CM | POA: Insufficient documentation

## 2019-05-08 DIAGNOSIS — M159 Polyosteoarthritis, unspecified: Secondary | ICD-10-CM | POA: Diagnosis not present

## 2019-05-08 DIAGNOSIS — F419 Anxiety disorder, unspecified: Secondary | ICD-10-CM | POA: Diagnosis not present

## 2019-05-08 HISTORY — DX: Gastric ulcer, unspecified as acute or chronic, without hemorrhage or perforation: K25.9

## 2019-05-08 HISTORY — DX: Sick sinus syndrome: I49.5

## 2019-05-08 HISTORY — DX: Polyosteoarthritis, unspecified: M15.9

## 2019-05-08 HISTORY — DX: Unspecified malignant neoplasm of skin, unspecified: C44.90

## 2019-05-08 HISTORY — DX: Personal history of diseases of the blood and blood-forming organs and certain disorders involving the immune mechanism: Z86.2

## 2019-05-08 HISTORY — DX: Localized edema: R60.0

## 2019-05-08 HISTORY — DX: Esophageal varices without bleeding: I85.00

## 2019-05-08 HISTORY — DX: Thrombocytopenia, unspecified: D69.6

## 2019-05-08 HISTORY — DX: Unspecified cirrhosis of liver: K74.60

## 2019-05-08 HISTORY — DX: Other specified diseases of anus and rectum: K62.89

## 2019-05-08 HISTORY — DX: Age-related osteoporosis without current pathological fracture: M81.0

## 2019-05-08 HISTORY — DX: Migraine, unspecified, not intractable, without status migrainosus: G43.909

## 2019-05-08 LAB — COMPREHENSIVE METABOLIC PANEL
ALT: 22 U/L (ref 0–44)
AST: 38 U/L (ref 15–41)
Albumin: 2.8 g/dL — ABNORMAL LOW (ref 3.5–5.0)
Alkaline Phosphatase: 68 U/L (ref 38–126)
Anion gap: 6 (ref 5–15)
BUN: 14 mg/dL (ref 8–23)
CO2: 22 mmol/L (ref 22–32)
Calcium: 8.1 mg/dL — ABNORMAL LOW (ref 8.9–10.3)
Chloride: 107 mmol/L (ref 98–111)
Creatinine, Ser: 0.66 mg/dL (ref 0.44–1.00)
GFR calc Af Amer: >60 mL/min (ref >60)
GFR calc non Af Amer: >60 mL/min (ref >60)
Glucose, Bld: 109 mg/dL — ABNORMAL HIGH (ref 70–99)
Potassium: 3.6 mmol/L (ref 3.5–5.1)
Sodium: 135 mmol/L (ref 135–145)
Total Bilirubin: 0.8 mg/dL (ref 0.3–1.2)
Total Protein: 7.1 g/dL (ref 6.5–8.1)

## 2019-05-08 LAB — CBC
HCT: 39.4 % (ref 36.0–46.0)
Hemoglobin: 12.8 g/dL (ref 12.0–15.0)
MCH: 31.7 pg (ref 26.0–34.0)
MCHC: 32.5 g/dL (ref 30.0–36.0)
MCV: 97.5 fL (ref 80.0–100.0)
Platelets: 99 10*3/uL — ABNORMAL LOW (ref 150–400)
RBC: 4.04 MIL/uL (ref 3.87–5.11)
RDW: 17.3 % — ABNORMAL HIGH (ref 11.5–15.5)
WBC: 1.8 10*3/uL — ABNORMAL LOW (ref 4.0–10.5)
nRBC: 0 % (ref 0.0–0.2)

## 2019-05-08 NOTE — Progress Notes (Signed)
PCP - Dr. Jannette Spanner Cardiologist - Clearance and last office visit note 3/92/21 M. Tillery P.A. in epic  Last devise check 05/01/19 in epic  Chest x-ray - greater than 1 year EKG - 04/23/19 in epic Stress Test - greater than 2 years ECHO - greater than 2 years Cardiac Cath - N/A  Sleep Study - N/A CPAP - N/A  Fasting Blood Sugar - N/A Checks Blood Sugar __N/A___ times a day  Blood Thinner Instructions: N/A Aspirin Instructions: N/A Last Dose: N/A  Anesthesia review: Pacemaker, MI, Stroke, Cirrhosis  Patient denies shortness of breath, fever, cough and chest pain at PAT appointment   Patient verbalized understanding of instructions that were given to them at the PAT appointment. Patient was also instructed that they will need to review over the PAT instructions again at home before surgery.

## 2019-05-09 LAB — HEMOGLOBIN A1C
Hgb A1c MFr Bld: 5.1 % (ref 4.8–5.6)
Mean Plasma Glucose: 100 mg/dL

## 2019-05-09 MED ORDER — SODIUM CHLORIDE 0.9 % IV SOLN
INTRAVENOUS | Status: DC
  Filled 2019-05-09: qty 6

## 2019-05-09 MED ORDER — GENTAMICIN SULFATE 40 MG/ML IJ SOLN
5.0000 mg/kg | INTRAVENOUS | Status: AC
  Administered 2019-05-10: 390 mg via INTRAVENOUS
  Filled 2019-05-09: qty 9.75

## 2019-05-09 MED ORDER — BUPIVACAINE LIPOSOME 1.3 % IJ SUSP
20.0000 mL | INTRAMUSCULAR | Status: DC
  Filled 2019-05-09: qty 20

## 2019-05-09 NOTE — Progress Notes (Signed)
Anesthesia Chart Review   Case: [id number] Date/Time: 05/10/19 1310   Procedure: TEM PARTIAL PROCTECTOMY OF RECTAL MASS (N/A )   Anesthesia type: General   Pre-op diagnosis: BLEEDING RECTAL POLYP   Location: [facility] ROOM 09 / [facility]   Surgeons: Michael Boston, MD      DISCUSSION:74 y.o. former smoker (quit 03/02/03) with h/o GERD, hypothyroidism, Bipolar disorder, hypothyroidism, liver cirrhosis, thrombocytopenia, SSS with pacemaker in place, bleeding rectal polyp scheduled for above procedure 05/10/2019 with Dr. Michael Boston.   Pt last seen by EP 04/23/19.  Per OV note, "She is cleared from a cardiac perspective to proceed with surgery with at least low risk of perioperative complications from a cardiac perspective with a Revised Cardiac Risk Index [place] Criteria) of at least 0.4%.  She will need additional clearance from Device Clinic once surgery scheduled and it is clear what position the patient will be in and what tools will be used (I.e. if electrocautery is expected)"  Device orders on chart and in encounters, documentation 05/07/19.   Anticipate pt can proceed with planned procedure barring acute status change.   VS: BP (!) 153/77 (BP Location: Left Arm)   Pulse 76   Temp 36.9 C (Oral)   Resp 18   Ht 5\' 9"  (1.753 m)   Wt 97.6 kg   SpO2 100%   BMI 31.76 kg/m   PROVIDERS: Rocco Serene, MD is PCP   Virl Axe, MD is Electrophysiologist  LABS: Labs reviewed: Acceptable for surgery. (all labs ordered are listed, but only abnormal results are displayed)  Labs Reviewed  COMPREHENSIVE METABOLIC PANEL - Abnormal; Notable for the following components:      Result Value   Glucose, Bld 109 (*)    Calcium 8.1 (*)    Albumin 2.8 (*)    All other components within normal limits  CBC - Abnormal; Notable for the following components:   WBC 1.8 (*)    RDW 17.3 (*)    Platelets 99 (*)    All other components within normal limits  HEMOGLOBIN A1C     IMAGES:   EKG: 04/23/19 Rate  70 bpm  CV: Echo 01/07/2017 Study Conclusions   - Left ventricle: The cavity size was normal. Wall thickness was  normal. Systolic function was normal. The estimated ejection  fraction was in the range of 50% to 55%. Wall motion was normal;  there were no regional wall motion abnormalities. Doppler  parameters are consistent with abnormal left ventricular  relaxation (grade 1 diastolic dysfunction).   Myocardial Perfusion 01/01/2016  Nuclear stress EF: 71%.  There was NSR with intermittent AV paced rhythm. No acute ST changes noted  There is a small defect of mild severity present in the apex location, likely apical thinning. The defect is non-reversible. No ischemia noted.  The left ventricular ejection fraction is hyperdynamic (>65%).  This is a low risk study. Past Medical History:  Diagnosis Date  . Adenomatous polyp of ascending colon   . Anxiety   . Bilateral lower extremity edema    burning sensations  . Bipolar 1 disorder ([place])   . Carpal tunnel syndrome   . Cirrhosis of liver ([place])   . Cutaneous horn   . Drug-induced Parkinsonism ([place])    patient unaware  . Esophageal varices ([place])   . Fibromyalgia    patient denies  . Gastric ulcer   . GERD (gastroesophageal reflux disease)   . Gingival abscess 08/26/2016  . Glaucoma 2003   pt unaware  .  Heart attack ([place])   . Heart disease   . Hepatitis, autoimmune ([place]) 08/25/2014  . History of iron deficiency anemia   . Hypotension   . Hypothyroidism   . Migraines   . Neutropenia ([place])   . Osteoporosis   . Pacemaker [employer]onic    MRI compatible  . Paranoid schizophrenia ([place])   . Polyarthritis   . Polyosteoarthritis   . Rectal mass   . Seizures ([place])    last sz 08/21/14  . Sinus arrest   . Skin cancer    forehead  . SSS (sick sinus syndrome) ([place])   . Stroke ([place])   . Thrombocytopenia ([place])   . Vertigo    bvvp    Past Surgical History:  Procedure Laterality Date  . BIOPSY  03/06/2019    Procedure: BIOPSY;  Surgeon: Thornton Park, MD;  [facility];  Service: Gastroenterology;;  EGD and COLON  . CATARACT EXTRACTION Left 2008   pt unaware  . CHOLECYSTECTOMY    . COLONOSCOPY WITH PROPOFOL N/A 03/06/2019   Procedure: COLONOSCOPY WITH PROPOFOL;  Surgeon: Thornton Park, MD;  [facility];  Service: Gastroenterology;  Laterality: N/A;  . ESOPHAGOGASTRODUODENOSCOPY (EGD) WITH PROPOFOL N/A 03/06/2019   Procedure: ESOPHAGOGASTRODUODENOSCOPY (EGD) WITH PROPOFOL ;  Surgeon: Thornton Park, MD;  [facility];  Service: Gastroenterology;  Laterality: N/A;  . KNEE SURGERY Left 1985  . PACEMAKER INSERTION  2014  . PARTIAL HYSTERECTOMY  1979  . POLYPECTOMY  03/06/2019   Procedure: POLYPECTOMY;  Surgeon: Thornton Park, MD;  [facility];  Service: Gastroenterology;;  . REFRACTIVE SURGERY    . TUBAL LIGATION      MEDICATIONS: . azathioprine (IMURAN) 100 MG tablet  . levothyroxine (SYNTHROID) 50 MCG tablet  . omeprazole (PRILOSEC) 20 MG capsule  . traMADol-acetaminophen (ULTRACET) 37.5-325 MG tablet   No current facility-administered medications for this encounter.   Derrill Memo ON 05/10/2019] bupivacaine liposome (EXPAREL) 1.3 % injection 266 mg  . [START ON 05/10/2019] clindamycin (CLEOCIN) 900 mg, gentamicin (GARAMYCIN) 240 mg in sodium chloride 0.9 % 1,000 mL for intraperitoneal lavage  . [START ON 05/10/2019] gentamicin (GARAMYCIN) 390 mg in dextrose 5 % 100 mL IVPB    Maia Plan [facility] Pre-Surgical Testing [phone] 05/09/19  11:50 AM

## 2019-05-09 NOTE — Progress Notes (Signed)
CBC results routed to Dr. Johney [place] for review

## 2019-05-10 ENCOUNTER — Inpatient Hospital Stay (HOSPITAL_COMMUNITY): Payer: Medicare Other | Admitting: Physician Assistant

## 2019-05-10 ENCOUNTER — Inpatient Hospital Stay (HOSPITAL_COMMUNITY): Payer: Medicare Other | Admitting: Certified Registered"

## 2019-05-10 ENCOUNTER — Encounter (HOSPITAL_COMMUNITY)
Admission: RE | Disposition: A | Discharge status: Home / Self Care | Payer: Self-pay | Source: Ambulatory Visit | Attending: Surgery

## 2019-05-10 ENCOUNTER — Inpatient Hospital Stay (HOSPITAL_COMMUNITY)
Admission: RE | Admit: 2019-05-10 | Discharge: 2019-05-14 | DRG: 394 | Disposition: A | Discharge status: Home Health | Payer: Medicare Other | Attending: Surgery | Admitting: Surgery

## 2019-05-10 ENCOUNTER — Other Ambulatory Visit: Payer: Self-pay

## 2019-05-10 ENCOUNTER — Encounter (HOSPITAL_COMMUNITY): Payer: Self-pay | Admitting: Surgery

## 2019-05-10 DIAGNOSIS — G40909 Epilepsy, unspecified, not intractable, without status epilepticus: Secondary | ICD-10-CM | POA: Diagnosis present

## 2019-05-10 DIAGNOSIS — R569 Unspecified convulsions: Secondary | ICD-10-CM

## 2019-05-10 DIAGNOSIS — F319 Bipolar disorder, unspecified: Secondary | ICD-10-CM | POA: Diagnosis present

## 2019-05-10 DIAGNOSIS — Z86010 Personal history of colonic polyps: Secondary | ICD-10-CM

## 2019-05-10 DIAGNOSIS — E739 Lactose intolerance, unspecified: Secondary | ICD-10-CM | POA: Diagnosis present

## 2019-05-10 DIAGNOSIS — I519 Heart disease, unspecified: Secondary | ICD-10-CM | POA: Diagnosis present

## 2019-05-10 DIAGNOSIS — Z950 Presence of cardiac pacemaker: Secondary | ICD-10-CM | POA: Diagnosis not present

## 2019-05-10 DIAGNOSIS — Z7952 Long term (current) use of systemic steroids: Secondary | ICD-10-CM

## 2019-05-10 DIAGNOSIS — E039 Hypothyroidism, unspecified: Secondary | ICD-10-CM | POA: Diagnosis present

## 2019-05-10 DIAGNOSIS — Z20822 Contact with and (suspected) exposure to covid-19: Secondary | ICD-10-CM | POA: Diagnosis present

## 2019-05-10 DIAGNOSIS — F419 Anxiety disorder, unspecified: Secondary | ICD-10-CM | POA: Diagnosis present

## 2019-05-10 DIAGNOSIS — E669 Obesity, unspecified: Secondary | ICD-10-CM | POA: Diagnosis present

## 2019-05-10 DIAGNOSIS — M2550 Pain in unspecified joint: Secondary | ICD-10-CM | POA: Diagnosis present

## 2019-05-10 DIAGNOSIS — Z7409 Other reduced mobility: Secondary | ICD-10-CM

## 2019-05-10 DIAGNOSIS — K51411 Inflammatory polyps of colon with rectal bleeding: Secondary | ICD-10-CM | POA: Diagnosis not present

## 2019-05-10 DIAGNOSIS — D128 Benign neoplasm of rectum: Secondary | ICD-10-CM | POA: Diagnosis present

## 2019-05-10 DIAGNOSIS — G8929 Other chronic pain: Secondary | ICD-10-CM | POA: Diagnosis present

## 2019-05-10 DIAGNOSIS — Z881 Allergy status to other antibiotic agents status: Secondary | ICD-10-CM | POA: Diagnosis not present

## 2019-05-10 DIAGNOSIS — T451X5A Adverse effect of antineoplastic and immunosuppressive drugs, initial encounter: Secondary | ICD-10-CM | POA: Diagnosis present

## 2019-05-10 DIAGNOSIS — Z85828 Personal history of other malignant neoplasm of skin: Secondary | ICD-10-CM | POA: Diagnosis not present

## 2019-05-10 DIAGNOSIS — K219 Gastro-esophageal reflux disease without esophagitis: Secondary | ICD-10-CM | POA: Diagnosis present

## 2019-05-10 DIAGNOSIS — K7460 Unspecified cirrhosis of liver: Secondary | ICD-10-CM | POA: Diagnosis present

## 2019-05-10 DIAGNOSIS — G2119 Other drug induced secondary parkinsonism: Secondary | ICD-10-CM | POA: Diagnosis present

## 2019-05-10 DIAGNOSIS — Z886 Allergy status to analgesic agent status: Secondary | ICD-10-CM

## 2019-05-10 DIAGNOSIS — Z885 Allergy status to narcotic agent status: Secondary | ICD-10-CM | POA: Diagnosis not present

## 2019-05-10 DIAGNOSIS — Z9049 Acquired absence of other specified parts of digestive tract: Secondary | ICD-10-CM | POA: Diagnosis not present

## 2019-05-10 DIAGNOSIS — D6959 Other secondary thrombocytopenia: Secondary | ICD-10-CM | POA: Diagnosis present

## 2019-05-10 DIAGNOSIS — Z6831 Body mass index (BMI) 31.0-31.9, adult: Secondary | ICD-10-CM

## 2019-05-10 DIAGNOSIS — K754 Autoimmune hepatitis: Secondary | ICD-10-CM | POA: Diagnosis present

## 2019-05-10 DIAGNOSIS — Z90711 Acquired absence of uterus with remaining cervical stump: Secondary | ICD-10-CM

## 2019-05-10 DIAGNOSIS — H409 Unspecified glaucoma: Secondary | ICD-10-CM | POA: Diagnosis present

## 2019-05-10 DIAGNOSIS — K649 Unspecified hemorrhoids: Secondary | ICD-10-CM | POA: Diagnosis present

## 2019-05-10 DIAGNOSIS — Z823 Family history of stroke: Secondary | ICD-10-CM

## 2019-05-10 DIAGNOSIS — Z803 Family history of malignant neoplasm of breast: Secondary | ICD-10-CM

## 2019-05-10 DIAGNOSIS — R229 Localized swelling, mass and lump, unspecified: Secondary | ICD-10-CM | POA: Diagnosis present

## 2019-05-10 DIAGNOSIS — F200 Paranoid schizophrenia: Secondary | ICD-10-CM | POA: Diagnosis present

## 2019-05-10 DIAGNOSIS — K643 Fourth degree hemorrhoids: Secondary | ICD-10-CM | POA: Diagnosis not present

## 2019-05-10 DIAGNOSIS — Z888 Allergy status to other drugs, medicaments and biological substances status: Secondary | ICD-10-CM | POA: Diagnosis not present

## 2019-05-10 DIAGNOSIS — T50905A Adverse effect of unspecified drugs, medicaments and biological substances, initial encounter: Secondary | ICD-10-CM | POA: Diagnosis present

## 2019-05-10 DIAGNOSIS — N9089 Other specified noninflammatory disorders of vulva and perineum: Secondary | ICD-10-CM | POA: Diagnosis not present

## 2019-05-10 DIAGNOSIS — Z880 Allergy status to penicillin: Secondary | ICD-10-CM | POA: Diagnosis not present

## 2019-05-10 DIAGNOSIS — I252 Old myocardial infarction: Secondary | ICD-10-CM | POA: Diagnosis not present

## 2019-05-10 DIAGNOSIS — K625 Hemorrhage of anus and rectum: Secondary | ICD-10-CM | POA: Diagnosis not present

## 2019-05-10 DIAGNOSIS — Z8249 Family history of ischemic heart disease and other diseases of the circulatory system: Secondary | ICD-10-CM

## 2019-05-10 DIAGNOSIS — M797 Fibromyalgia: Secondary | ICD-10-CM | POA: Diagnosis present

## 2019-05-10 DIAGNOSIS — Z9842 Cataract extraction status, left eye: Secondary | ICD-10-CM

## 2019-05-10 DIAGNOSIS — Z87891 Personal history of nicotine dependence: Secondary | ICD-10-CM

## 2019-05-10 DIAGNOSIS — D702 Other drug-induced agranulocytosis: Secondary | ICD-10-CM | POA: Diagnosis present

## 2019-05-10 DIAGNOSIS — Z8673 Personal history of transient ischemic attack (TIA), and cerebral infarction without residual deficits: Secondary | ICD-10-CM

## 2019-05-10 DIAGNOSIS — D72819 Decreased white blood cell count, unspecified: Secondary | ICD-10-CM | POA: Diagnosis present

## 2019-05-10 DIAGNOSIS — Z833 Family history of diabetes mellitus: Secondary | ICD-10-CM

## 2019-05-10 DIAGNOSIS — D61818 Other pancytopenia: Secondary | ICD-10-CM | POA: Diagnosis present

## 2019-05-10 HISTORY — PX: PARTIAL PROCTECTOMY BY TEM: SHX6011

## 2019-05-10 SURGERY — PARTIAL PROCTECTOMY BY TEM
Anesthesia: General

## 2019-05-10 MED ORDER — SODIUM CHLORIDE 0.9% FLUSH
3.0000 mL | Freq: Two times a day (BID) | INTRAVENOUS | Status: DC
  Administered 2019-05-10 – 2019-05-14 (×8): 3 mL via INTRAVENOUS

## 2019-05-10 MED ORDER — AZATHIOPRINE 50 MG PO TABS
100.0000 mg | ORAL_TABLET | Freq: Every day | ORAL | Status: DC
  Administered 2019-05-10 – 2019-05-13 (×4): 100 mg via ORAL
  Filled 2019-05-10 (×5): qty 2

## 2019-05-10 MED ORDER — CHLORHEXIDINE GLUCONATE CLOTH 2 % EX PADS: 6.0000 | MEDICATED_PAD | Freq: Once | CUTANEOUS | Status: DC

## 2019-05-10 MED ORDER — ONDANSETRON HCL 4 MG/2ML IJ SOLN: 4.0000 mg | Freq: Once | INTRAMUSCULAR | Status: DC | PRN

## 2019-05-10 MED ORDER — TRAMADOL-ACETAMINOPHEN 37.5-325 MG PO TABS
2.0000 | ORAL_TABLET | ORAL | Status: DC | PRN
  Administered 2019-05-11 – 2019-05-14 (×4): 2 via ORAL
  Filled 2019-05-10 (×4): qty 2

## 2019-05-10 MED ORDER — LIDOCAINE 2% (20 MG/ML) 5 ML SYRINGE
INTRAMUSCULAR | Status: AC
  Filled 2019-05-10: qty 5

## 2019-05-10 MED ORDER — LACTATED RINGERS IV SOLN: INTRAVENOUS | Status: DC

## 2019-05-10 MED ORDER — FENTANYL CITRATE (PF) 100 MCG/2ML IJ SOLN
INTRAMUSCULAR | Status: AC
  Filled 2019-05-10: qty 2

## 2019-05-10 MED ORDER — ONDANSETRON HCL 4 MG/2ML IJ SOLN: 4.0000 mg | Freq: Four times a day (QID) | INTRAMUSCULAR | Status: DC | PRN

## 2019-05-10 MED ORDER — ONDANSETRON HCL 4 MG/2ML IJ SOLN
INTRAMUSCULAR | Status: AC
  Filled 2019-05-10: qty 2

## 2019-05-10 MED ORDER — LIDOCAINE 2% (20 MG/ML) 5 ML SYRINGE
INTRAMUSCULAR | Status: DC | PRN
  Administered 2019-05-10: 100 mg via INTRAVENOUS

## 2019-05-10 MED ORDER — FENTANYL CITRATE (PF) 100 MCG/2ML IJ SOLN
INTRAMUSCULAR | Status: DC | PRN
  Administered 2019-05-10 (×2): 50 ug via INTRAVENOUS

## 2019-05-10 MED ORDER — PROCHLORPERAZINE MALEATE 10 MG PO TABS
10.0000 mg | ORAL_TABLET | Freq: Four times a day (QID) | ORAL | Status: DC | PRN
  Filled 2019-05-10: qty 1

## 2019-05-10 MED ORDER — GABAPENTIN 300 MG PO CAPS
300.0000 mg | ORAL_CAPSULE | ORAL | Status: AC
  Administered 2019-05-10: 300 mg via ORAL
  Filled 2019-05-10: qty 1

## 2019-05-10 MED ORDER — CLINDAMYCIN PHOSPHATE 900 MG/50ML IV SOLN
900.0000 mg | Freq: Three times a day (TID) | INTRAVENOUS | Status: AC
  Administered 2019-05-10: 900 mg via INTRAVENOUS
  Filled 2019-05-10: qty 50

## 2019-05-10 MED ORDER — BUPIVACAINE HCL 0.25 % IJ SOLN
INTRAMUSCULAR | Status: AC
  Filled 2019-05-10: qty 1

## 2019-05-10 MED ORDER — MAGIC MOUTHWASH
15.0000 mL | Freq: Four times a day (QID) | ORAL | Status: DC | PRN
  Filled 2019-05-10: qty 15

## 2019-05-10 MED ORDER — MIDAZOLAM HCL 2 MG/2ML IJ SOLN
INTRAMUSCULAR | Status: AC
  Filled 2019-05-10: qty 2

## 2019-05-10 MED ORDER — NEOMYCIN SULFATE 500 MG PO TABS
1000.0000 mg | ORAL_TABLET | ORAL | Status: DC
  Filled 2019-05-10: qty 2

## 2019-05-10 MED ORDER — SODIUM CHLORIDE (PF) 0.9 % IJ SOLN
INTRAMUSCULAR | Status: AC
  Filled 2019-05-10: qty 20

## 2019-05-10 MED ORDER — LEVOTHYROXINE SODIUM 50 MCG PO TABS
50.0000 ug | ORAL_TABLET | Freq: Every day | ORAL | Status: DC
  Administered 2019-05-11 – 2019-05-14 (×4): 50 ug via ORAL
  Filled 2019-05-10 (×4): qty 1

## 2019-05-10 MED ORDER — ONDANSETRON HCL 4 MG PO TABS
4.0000 mg | ORAL_TABLET | Freq: Four times a day (QID) | ORAL | Status: DC | PRN
  Administered 2019-05-11: 4 mg via ORAL
  Filled 2019-05-10: qty 1

## 2019-05-10 MED ORDER — METOPROLOL TARTRATE 5 MG/5ML IV SOLN: 5.0000 mg | Freq: Four times a day (QID) | INTRAVENOUS | Status: DC | PRN

## 2019-05-10 MED ORDER — SODIUM CHLORIDE 0.9 % IV SOLN: 250.0000 mL | INTRAVENOUS | Status: DC | PRN

## 2019-05-10 MED ORDER — ROCURONIUM BROMIDE 10 MG/ML (PF) SYRINGE
PREFILLED_SYRINGE | INTRAVENOUS | Status: DC | PRN
  Administered 2019-05-10: 20 mg via INTRAVENOUS
  Administered 2019-05-10: 50 mg via INTRAVENOUS

## 2019-05-10 MED ORDER — HYDROMORPHONE HCL 1 MG/ML IJ SOLN: 0.5000 mg | INTRAMUSCULAR | Status: DC | PRN

## 2019-05-10 MED ORDER — SODIUM CHLORIDE 0.9 % IR SOLN
Status: DC | PRN
  Administered 2019-05-10: 1000 mL

## 2019-05-10 MED ORDER — PROPOFOL 10 MG/ML IV BOLUS
INTRAVENOUS | Status: DC | PRN
  Administered 2019-05-10: 40 mg via INTRAVENOUS
  Administered 2019-05-10: 50 mg via INTRAVENOUS

## 2019-05-10 MED ORDER — DEXAMETHASONE SODIUM PHOSPHATE 10 MG/ML IJ SOLN
INTRAMUSCULAR | Status: AC
  Filled 2019-05-10: qty 1

## 2019-05-10 MED ORDER — BUPIVACAINE-EPINEPHRINE 0.5% -1:200000 IJ SOLN
INTRAMUSCULAR | Status: AC
  Filled 2019-05-10: qty 1

## 2019-05-10 MED ORDER — ENOXAPARIN SODIUM 40 MG/0.4ML ~~LOC~~ SOLN
40.0000 mg | Freq: Once | SUBCUTANEOUS | Status: AC
  Administered 2019-05-10: 40 mg via SUBCUTANEOUS
  Filled 2019-05-10: qty 0.4

## 2019-05-10 MED ORDER — ENOXAPARIN SODIUM 40 MG/0.4ML ~~LOC~~ SOLN
40.0000 mg | SUBCUTANEOUS | Status: DC
  Administered 2019-05-11 – 2019-05-14 (×4): 40 mg via SUBCUTANEOUS
  Filled 2019-05-10 (×4): qty 0.4

## 2019-05-10 MED ORDER — MEPERIDINE HCL 50 MG/ML IJ SOLN: 6.2500 mg | INTRAMUSCULAR | Status: DC | PRN

## 2019-05-10 MED ORDER — DEXAMETHASONE SODIUM PHOSPHATE 10 MG/ML IJ SOLN
INTRAMUSCULAR | Status: DC | PRN
  Administered 2019-05-10: 10 mg via INTRAVENOUS

## 2019-05-10 MED ORDER — KETAMINE HCL 10 MG/ML IJ SOLN
INTRAMUSCULAR | Status: AC
  Filled 2019-05-10: qty 1

## 2019-05-10 MED ORDER — LACTATED RINGERS IV SOLN: INTRAVENOUS | Status: DC | PRN

## 2019-05-10 MED ORDER — ONDANSETRON HCL 4 MG/2ML IJ SOLN
INTRAMUSCULAR | Status: DC | PRN
  Administered 2019-05-10: 4 mg via INTRAVENOUS

## 2019-05-10 MED ORDER — SUGAMMADEX SODIUM 200 MG/2ML IV SOLN
INTRAVENOUS | Status: DC | PRN
  Administered 2019-05-10: 200 mg via INTRAVENOUS

## 2019-05-10 MED ORDER — METRONIDAZOLE 500 MG PO TABS
1000.0000 mg | ORAL_TABLET | ORAL | Status: DC
  Filled 2019-05-10: qty 2

## 2019-05-10 MED ORDER — BUPIVACAINE-EPINEPHRINE (PF) 0.5% -1:200000 IJ SOLN
INTRAMUSCULAR | Status: DC | PRN
  Administered 2019-05-10: 20 mL via PERINEURAL

## 2019-05-10 MED ORDER — ALVIMOPAN 12 MG PO CAPS: 12.0000 mg | ORAL_CAPSULE | Freq: Two times a day (BID) | ORAL | Status: DC

## 2019-05-10 MED ORDER — SODIUM CHLORIDE 0.9% FLUSH: 3.0000 mL | INTRAVENOUS | Status: DC | PRN

## 2019-05-10 MED ORDER — PANTOPRAZOLE SODIUM 40 MG PO TBEC
80.0000 mg | DELAYED_RELEASE_TABLET | Freq: Every day | ORAL | Status: DC
  Administered 2019-05-10 – 2019-05-14 (×5): 80 mg via ORAL
  Filled 2019-05-10 (×5): qty 2

## 2019-05-10 MED ORDER — ALUM & MAG HYDROXIDE-SIMETH 200-200-20 MG/5ML PO SUSP: 30.0000 mL | Freq: Four times a day (QID) | ORAL | Status: DC | PRN

## 2019-05-10 MED ORDER — CLINDAMYCIN PHOSPHATE 900 MG/50ML IV SOLN
900.0000 mg | INTRAVENOUS | Status: AC
  Administered 2019-05-10: 900 mg via INTRAVENOUS
  Filled 2019-05-10: qty 50

## 2019-05-10 MED ORDER — HYDROMORPHONE HCL 1 MG/ML IJ SOLN: 0.2500 mg | INTRAMUSCULAR | Status: DC | PRN

## 2019-05-10 MED ORDER — BISACODYL 5 MG PO TBEC: 20.0000 mg | DELAYED_RELEASE_TABLET | Freq: Once | ORAL | Status: DC

## 2019-05-10 MED ORDER — PROPOFOL 10 MG/ML IV BOLUS
INTRAVENOUS | Status: AC
  Filled 2019-05-10: qty 20

## 2019-05-10 MED ORDER — ROCURONIUM BROMIDE 10 MG/ML (PF) SYRINGE
PREFILLED_SYRINGE | INTRAVENOUS | Status: AC
  Filled 2019-05-10: qty 10

## 2019-05-10 MED ORDER — DIBUCAINE (PERIANAL) 1 % EX OINT
TOPICAL_OINTMENT | CUTANEOUS | Status: DC | PRN
  Administered 2019-05-10: 1 via RECTAL

## 2019-05-10 MED ORDER — KETAMINE HCL 10 MG/ML IJ SOLN
INTRAMUSCULAR | Status: DC | PRN
  Administered 2019-05-10: 20 mg via INTRAVENOUS
  Administered 2019-05-10 (×3): 10 mg via INTRAVENOUS
  Administered 2019-05-10: 50 mg via INTRAVENOUS

## 2019-05-10 MED ORDER — SODIUM CHLORIDE 0.9 % IV BOLUS: 1000.0000 mL | Freq: Three times a day (TID) | INTRAVENOUS | Status: AC | PRN

## 2019-05-10 MED ORDER — POLYETHYLENE GLYCOL 3350 17 GM/SCOOP PO POWD
1.0000 | Freq: Once | ORAL | Status: DC
  Filled 2019-05-10: qty 255

## 2019-05-10 MED ORDER — BUPIVACAINE LIPOSOME 1.3 % IJ SUSP
INTRAMUSCULAR | Status: DC | PRN
  Administered 2019-05-10: 20 mL

## 2019-05-10 MED ORDER — DIBUCAINE (PERIANAL) 1 % EX OINT
TOPICAL_OINTMENT | CUTANEOUS | Status: AC
  Filled 2019-05-10: qty 28

## 2019-05-10 MED ORDER — LIP MEDEX EX OINT
1.0000 "application " | TOPICAL_OINTMENT | Freq: Two times a day (BID) | CUTANEOUS | Status: DC
  Administered 2019-05-10 – 2019-05-14 (×8): 1 via TOPICAL
  Filled 2019-05-10 (×3): qty 7

## 2019-05-10 MED ORDER — PROCHLORPERAZINE EDISYLATE 10 MG/2ML IJ SOLN: 5.0000 mg | Freq: Four times a day (QID) | INTRAMUSCULAR | Status: DC | PRN

## 2019-05-10 MED ORDER — GABAPENTIN 100 MG PO CAPS
200.0000 mg | ORAL_CAPSULE | Freq: Three times a day (TID) | ORAL | Status: DC
  Administered 2019-05-10 – 2019-05-14 (×12): 200 mg via ORAL
  Filled 2019-05-10 (×12): qty 2

## 2019-05-10 MED ORDER — MIDAZOLAM HCL 5 MG/5ML IJ SOLN
INTRAMUSCULAR | Status: DC | PRN
  Administered 2019-05-10 (×2): 1 mg via INTRAVENOUS

## 2019-05-10 SURGICAL SUPPLY — 52 items
BLADE SURG 15 STRL LF DISP TIS (BLADE) IMPLANT
BLADE SURG 15 STRL SS (BLADE)
BRIEF STRETCH FOR OB PAD LRG (UNDERPADS AND DIAPERS) IMPLANT
CABLE HIGH FREQUENCY MONO STRZ (ELECTRODE) ×2 IMPLANT
COVER WAND RF STERILE (DRAPES) IMPLANT
DRAPE LAPAROTOMY T 102X78X121 (DRAPES) IMPLANT
DRAPE SURG IRRIG POUCH 19X23 (DRAPES) ×2 IMPLANT
DRAPE WARM FLUID 44X44 (DRAPES) ×2 IMPLANT
DRSG PAD ABDOMINAL 8X10 ST (GAUZE/BANDAGES/DRESSINGS) ×1 IMPLANT
ELECT PENCIL ROCKER SW 15FT (MISCELLANEOUS) ×1 IMPLANT
ELECT REM PT RETURN 15FT ADLT (MISCELLANEOUS) ×2 IMPLANT
GAUZE 4X4 16PLY RFD (DISPOSABLE) ×2 IMPLANT
GAUZE SPONGE 4X4 12PLY STRL (GAUZE/BANDAGES/DRESSINGS) ×1 IMPLANT
GLOVE ECLIPSE 8.0 STRL XLNG CF (GLOVE) ×4 IMPLANT
GLOVE INDICATOR 8.0 STRL GRN (GLOVE) ×4 IMPLANT
GOWN STRL REUS W/TWL XL LVL3 (GOWN DISPOSABLE) ×6 IMPLANT
KIT BASIN OR (CUSTOM PROCEDURE TRAY) ×2 IMPLANT
KIT TURNOVER KIT A (KITS) IMPLANT
LEGGING LITHOTOMY PAIR STRL (DRAPES) ×2 IMPLANT
NEEDLE HYPO 22GX1.5 SAFETY (NEEDLE) ×2 IMPLANT
PACK BASIC VI WITH GOWN DISP (CUSTOM PROCEDURE TRAY) ×2 IMPLANT
PAD POSITIONING PINK XL (MISCELLANEOUS) ×2 IMPLANT
PENCIL SMOKE EVACUATOR (MISCELLANEOUS) IMPLANT
RETRACTOR LONE STAR DISPOSABLE (INSTRUMENTS) IMPLANT
RETRACTOR STAY HOOK 5MM (MISCELLANEOUS) IMPLANT
SCISSORS LAP 5X35 DISP (ENDOMECHANICALS) ×2 IMPLANT
SET IRRIG TUBING LAPAROSCOPIC (IRRIGATION / IRRIGATOR) ×2 IMPLANT
SET TUBE SMOKE EVAC HIGH FLOW (TUBING) ×2 IMPLANT
SHEARS HARMONIC ACE PLUS 36CM (ENDOMECHANICALS) ×2 IMPLANT
STOPCOCK 4 WAY LG BORE MALE ST (IV SETS) IMPLANT
SURGILUBE 2OZ TUBE FLIPTOP (MISCELLANEOUS) ×2 IMPLANT
SUT CHROMIC 3 0 SH 27 (SUTURE) IMPLANT
SUT MNCRL AB 4-0 PS2 18 (SUTURE) ×4 IMPLANT
SUT PDS AB 2-0 CT2 27 (SUTURE) IMPLANT
SUT PDS AB 3-0 SH 27 (SUTURE) IMPLANT
SUT SILK 2 0 (SUTURE)
SUT SILK 2 0 SH CR/8 (SUTURE) IMPLANT
SUT SILK 2-0 18XBRD TIE 12 (SUTURE) IMPLANT
SUT SILK 3 0 SH 30 (SUTURE) IMPLANT
SUT SILK 3 0 SH CR/8 (SUTURE) IMPLANT
SUT V-LOC BARB 180 2/0GR6 GS22 (SUTURE)
SUT VIC AB 2-0 UR6 27 (SUTURE) ×1 IMPLANT
SUT VIC AB 3-0 SH 27 (SUTURE)
SUT VIC AB 3-0 SH 27XBRD (SUTURE) IMPLANT
SUTURE V-LC BRB 180 2/0GR6GS22 (SUTURE) IMPLANT
SYR 20ML LL LF (SYRINGE) ×2 IMPLANT
SYR BULB IRRIGATION 50ML (SYRINGE) IMPLANT
TOWEL OR 17X26 10 PK STRL BLUE (TOWEL DISPOSABLE) ×2 IMPLANT
TOWEL OR NON WOVEN STRL DISP B (DISPOSABLE) ×2 IMPLANT
TRAY FOLEY MTR SLVR 16FR STAT (SET/KITS/TRAYS/PACK) ×2 IMPLANT
TUBING CONNECTING 10 (TUBING) IMPLANT
YANKAUER SUCT BULB TIP 10FT TU (MISCELLANEOUS) IMPLANT

## 2019-05-10 NOTE — Transfer of Care (Signed)
Immediate Anesthesia Transfer of Care Note  Patient: Stacey Velazquez  Procedure(s) Performed: Procedure(s): TEM PARTIAL PROCTECTOMY OF RECTAL MASS (N/A)  Patient Location: PACU  Anesthesia Type:General  Level of Consciousness: Alert, Awake, Oriented  Airway & Oxygen Therapy: Patient Spontanous Breathing  Post-op Assessment: Report given to RN  Post vital signs: Reviewed and stable  Last Vitals:  Vitals:   05/10/19 1131  BP: 129/78  Pulse: 81  Resp: 18  Temp: 36.8 C  SpO2: 826%    Complications: No apparent anesthesia complications

## 2019-05-10 NOTE — Progress Notes (Signed)
Pharmacy Brief Note - Alvimopan (Entereg)  Alvimopan (Entereg) Restrictions per [place] P&T and MEC:  Patients must have partial large or small bowel resection with primary anastomosis.  Patients must be capable of taking alvimopan PO; not administered via NG tube.  Patients must receive preoperative alvimopan to receive postoperative doses.  Alvimopan is not continued after discharge.  Alvimopan must be ordered by a credentialed prescriber  Alvimopan will automatically be discontinued after patient has a bowel movement.   Alvimopan has been discontinued.  If there are questions, please contact the pharmacy at [phone].  Thank you  Gretta Arab PharmD, BCPS Pager: [phone] 05/10/2019 6:53 PM

## 2019-05-10 NOTE — Anesthesia Preprocedure Evaluation (Addendum)
Anesthesia Evaluation  Patient identified by MRN, date of birth, ID band Patient awake    Reviewed: Allergy & Precautions, NPO status , Patient's Chart, lab work & pertinent test results  Airway Mallampati: III  TM Distance: >3 FB Neck ROM: Full    Dental no notable dental hx. (+) Dental Advisory Given, Edentulous Upper, Missing,    Pulmonary neg pulmonary ROS, former smoker,    Pulmonary exam normal breath sounds clear to auscultation       Cardiovascular + Past MI  Normal cardiovascular exam+ pacemaker  Rhythm:Regular Rate:Normal  Metronic pacer Interrogation 05/01/2019 94.6% AS-VS, 5.4% AP-VS   Neuro/Psych  Headaches, Seizures -,  PSYCHIATRIC DISORDERS Anxiety Bipolar Disorder Schizophrenia CVA    GI/Hepatic PUD, GERD  ,(+) Hepatitis -, Autoimmune  Endo/Other  Hypothyroidism   Renal/GU negative Renal ROS     Musculoskeletal  (+) Arthritis , Fibromyalgia -  Abdominal (+) + obese,   Peds  Hematology negative hematology ROS (+)   Anesthesia Other Findings   Reproductive/Obstetrics negative OB ROS                            Anesthesia Physical Anesthesia Plan  ASA: III  Anesthesia Plan: General   Post-op Pain Management:    Induction: Intravenous  PONV Risk Score and Plan: 4 or greater and Ondansetron, Dexamethasone and Treatment may vary due to age or medical condition  Airway Management Planned: Oral ETT  Additional Equipment: None  Intra-op Plan:   Post-operative Plan: Extubation in OR  Informed Consent: I have reviewed the patients History and Physical, chart, labs and discussed the procedure including the risks, benefits and alternatives for the proposed anesthesia with the patient or authorized representative who has indicated his/her understanding and acceptance.     Dental advisory given  Plan Discussed with: CRNA  Anesthesia Plan Comments:          Anesthesia Quick Evaluation

## 2019-05-10 NOTE — Discharge Instructions (Signed)
ANORECTAL SURGERY:  POST OPERATIVE INSTRUCTIONS  ######################################################################  EAT Start with a pureed / full liquid diet After 24 hours, gradually transition to a high fiber diet.    CONTROL PAIN Control pain so you can tolerate bowel movements,  walk, sleep, tolerate sneezing/coughing, and go up/down stairs.   HAVE A BOWEL MOVEMENT DAILY Keep your bowels regular to avoid problems.   Taking a fiber supplement every day to keep bowels soft.   Try a laxative to override constipation. Use an antidairrheal to slow down diarrhea.   Call if not better after 2 tries  WALK Walk an hour a day.  Control your pain to do that.   CALL IF YOU HAVE PROBLEMS/CONCERNS Call if you are still struggling despite following these instructions. Call if you have concerns not answered by these instructions  ######################################################################    1. Take your usually prescribed home medications unless otherwise directed.  2. DIET: Follow a light bland diet & liquids the first 24 hours after arrival home, such as soup, liquids, starches, etc.  Be sure to drink plenty of fluids.  Quickly advance to a usual solid diet within a few days.  Avoid fast food or heavy meals as your are more likely to get nauseated or have irregular bowels.  A low-fat, high-fiber diet for the rest of your life is ideal.  3. PAIN CONTROL: a. Pain is best controlled by a usual combination of three different methods TOGETHER: i. Ice/Heat ii. Over the counter pain medication iii. Prescription pain medication b. Expect swelling and discomfort in the anus/rectal area.  Warm water baths (30-60 minutes up to 6 times a day, especially after bowel meovements) will help. Use ice for the first few days to help decrease swelling and bruising, then switch to heat such as warm towels, sitz baths, warm baths, etc to help relax tight/sore spots and speed recovery.   Some people prefer to use ice alone, heat alone, alternating between ice & heat.  Experiment to what works for you.   c. It is helpful to take an over-the-counter pain medication continuously for the first few weeks.  Choose one of the following that works best for you: i. Naproxen (Aleve, etc)  Two 250m tabs twice a day ii. Ibuprofen (Advil, etc) Three 2072mtabs four times a day (every meal & bedtime) iii. Acetaminophen (Tylenol, etc) 500-65044mour times a day (every meal & bedtime) d. A  prescription for pain medication (such as oxycodone, hydrocodone, etc) should be given to you upon discharge.  Take your pain medication as prescribed.  i. If you are having problems/concerns with the prescription medicine (does not control pain, nausea, vomiting, rash, itching, etc), please call us [phone] see if we need to switch you to a different pain medicine that will work better for you and/or control your side effect better. ii. If you need a refill on your pain medication, please contact your pharmacy.  They will contact our office to request authorization. Prescriptions will not be filled after 5 pm or on week-ends.  If can take up to 48 hours for it to be filled & ready so avoid waiting until you are down to thel ast pill. e. A topical cream (Dibucaine) or a prescription for a cream (such as diltiazem 2% gel) may be given to you.  Many people find relief with topical creams.  Some people find it burns too much.  Experiment.  If it helps, use it.  If it burns, don't using  it.  Use a Sitz Bath 4-8 times a day for relief   [employer] A sitz bath is a warm water bath taken in the sitting position that covers only the hips and buttocks. It may be used for either healing or hygiene purposes. Sitz baths are also used to relieve pain, itching, or muscle spasms. The water may contain medicine. Moist heat will help you heal and relax.  HOME CARE INSTRUCTIONS  Take 3 to 4 sitz baths a day. 1. Fill the  bathtub half full with warm water. 2. Sit in the water and open the drain a little. 3. Turn on the warm water to keep the tub half full. Keep the water running constantly. 4. Soak in the water for 15 to 20 minutes. 5. After the sitz bath, pat the affected area dry first.   4. KEEP YOUR BOWELS REGULAR a. The goal is one soft bowel movement a day b. Avoid getting constipated.  Between the surgery and the pain medications, it is common to experience some constipation.  Increasing fluid intake and taking a fiber supplement (such as Metamucil, Citrucel, FiberCon, MiraLax, etc) 2-3 times a day regularly will usually help prevent this problem from occurring.  A mild laxative (prune juice, Milk of Magnesia, MiraLax, etc) should be taken according to package directions if there are no bowel movements after 48 hours. c. Watch out for diarrhea.  If you have many loose bowel movements, simplify your diet to bland foods & liquids for a few days.  Stop any stool softeners and decrease your fiber supplement.  Switching to mild anti-diarrheal medications (Kayopectate, Pepto Bismol) can help.  Can try an imodium/loperamide dose.  If this worsens or does not improve, please call us.  5. Wound Care  a. Remove your bandages with your first bowel movement, usually the day after surgery.  You may have packing if you had an abscess.  Let any packing or gauze fall come out.   b. Wear an absorbent pad or soft cotton balls in your underwear as needed to catch any drainage and help keep the area  c. Keep the area clean and dry.  Bathe / shower every day.  Keep the area clean by showering / bathing over the incision / wound.   It is okay to soak an open wound to help wash it.  Consider using a squeeze bottle filled with warm water to gently wash the anal area.  Wet wipes or showers / gentle washing after bowel movements is often less traumatic than regular toilet paper. d. Dennis Bast will often notice bleeding with bowel movements.   This should slow down by the end of the first week of surgery.  Sitting on an ice pack can help. e. Expect some drainage.  This should slow down by the end of the first week of surgery, but you will have occasional bleeding or drainage up to a few months after surgery.  Wear an absorbent pad or soft cotton gauze in your underwear until the drainage stops.  6. ACTIVITIES as tolerated:   a. You may resume regular (light) daily activities beginning the next day--such as daily self-care, walking, climbing stairs--gradually increasing activities as tolerated.  If you can walk 30 minutes without difficulty, it is safe to try more intense activity such as jogging, treadmill, bicycling, low-impact aerobics, swimming, etc. b. Save the most intensive and strenuous activity for last such as sit-ups, heavy lifting, contact sports, etc  Refrain from any heavy lifting or straining  until you are off narcotics for pain control.   c. DO NOT PUSH THROUGH PAIN.  Let pain be your guide: If it hurts to do something, don't do it.  Pain is your body warning you to avoid that activity for another week until the pain goes down. d. You may drive when you are no longer taking prescription pain medication, you can comfortably sit for long periods of time, and you can safely maneuver your car and apply brakes. e. Dennis Bast may have sexual intercourse when it is comfortable.  7. FOLLOW UP in our office a. Please call [facility] at [phone] to set up an appointment to see your surgeon in the office for a follow-up appointment approximately 2-3 weeks after your surgery. b. Make sure that you call for this appointment the day you arrive home to ensure a convenient appointment time.  8. IF YOU HAVE DISABILITY OR FAMILY LEAVE FORMS, BRING THEM TO THE OFFICE FOR PROCESSING.  DO NOT GIVE THEM TO YOUR DOCTOR.        WHEN TO CALL us [phone]: 1. Poor pain control 2. Reactions / problems with new medications (rash/itching, nausea,  etc)  3. Fever over 101.5 F (38.5 C) 4. Inability to urinate 5. Nausea and/or vomiting 6. Worsening swelling or bruising 7. Continued bleeding from incision. 8. Increased pain, redness, or drainage from the incision  The clinic staff is available to answer your questions during regular business hours (8:30am-5pm).  Please don't hesitate to call and ask to speak to one of our nurses for clinical concerns.   A surgeon from [facility] is always on call at the hospitals   If you have a medical emergency, go to the nearest emergency room or call 911.    [facility], [place], [place], [place], [place]  [address] ? MAIN: [phone] ? TOLL FREE: [phone] ? FAX [phone] [email or web address]   Colon Polyps  Polyps are tissue growths inside the body. Polyps can grow in many places, including the large intestine (colon). A polyp may be a round bump or a mushroom-shaped growth. You could have one polyp or several. Most colon polyps are noncancerous (benign). However, some colon polyps can become cancerous over time. Finding and removing the polyps early can help prevent this. What are the causes? The exact cause of colon polyps is not known. What increases the risk? You are more likely to develop this condition if you:  Have a family history of colon cancer or colon polyps.  Are older than 56 or older than 45 if you are African American.  Have inflammatory bowel disease, such as ulcerative colitis or Crohn's disease.  Have certain hereditary conditions, such as: ? Familial adenomatous polyposis. ? Lynch syndrome. ? Turcot syndrome. ? Peutz-Jeghers syndrome.  Are overweight.  Smoke cigarettes.  Do not get enough exercise.  Drink too much alcohol.  Eat a diet that is high in fat and red meat and low in fiber.  Had childhood cancer that was treated with abdominal radiation. What are the signs or symptoms? Most  polyps do not cause symptoms. If you have symptoms, they may include:  Blood coming from your rectum when having a bowel movement.  Blood in your stool. The stool may look dark red or black.  Abdominal pain.  A change in bowel habits, such as constipation or diarrhea. How is this diagnosed? This condition is diagnosed with a colonoscopy. This is a procedure in which a  lighted, flexible scope is inserted into the anus and then passed into the colon to examine the area. Polyps are sometimes found when a colonoscopy is done as part of routine cancer screening tests. How is this treated? Treatment for this condition involves removing any polyps that are found. Most polyps can be removed during a colonoscopy. Those polyps will then be tested for cancer. Additional treatment may be needed depending on the results of testing. Follow these instructions at home: Lifestyle  Maintain a healthy weight, or lose weight if recommended by your health care provider.  Exercise every day or as told by your health care provider.  Do not use any products that contain nicotine or tobacco, such as cigarettes and e-cigarettes. If you need help quitting, ask your health care provider.  If you drink alcohol, limit how much you have: ? 0-1 drink a day for women. ? 0-2 drinks a day for men.  Be aware of how much alcohol is in your drink. In the [place] one drink equals one 12 oz bottle of beer (355 mL), one 5 oz glass of wine (148 mL), or one 1 oz shot of hard liquor (44 mL). Eating and drinking   Eat foods that are high in fiber, such as fruits, vegetables, and whole grains.  Eat foods that are high in calcium and vitamin D, such as milk, cheese, yogurt, eggs, liver, fish, and broccoli.  Limit foods that are high in fat, such as fried foods and desserts.  Limit the amount of red meat and processed meat you eat, such as hot dogs, sausage, bacon, and lunch meats. General instructions  Keep all follow-up  visits as told by your health care provider. This is important. ? This includes having regularly scheduled colonoscopies. ? Talk to your health care provider about when you need a colonoscopy. Contact a health care provider if:  You have new or worsening bleeding during a bowel movement.  You have new or increased blood in your stool.  You have a change in bowel habits.  You lose weight for no known reason. Summary  Polyps are tissue growths inside the body. Polyps can grow in many places, including the colon.  Most colon polyps are noncancerous (benign), but some can become cancerous over time.  This condition is diagnosed with a colonoscopy.  Treatment for this condition involves removing any polyps that are found. Most polyps can be removed during a colonoscopy. This information is not intended to replace advice given to you by your health care provider. Make sure you discuss any questions you have with your health care provider. Document Revised: 04/28/2017 Document Reviewed: 04/28/2017 Elsevier Patient Education  [place].  TRANSANAL ENDOSCOPIC MICROSURGERY (TEM)  Transanal endoscopic microsurgery (TEM) was developed as a means to provide a less invasive way to operate on the rectum.  This may needed to provide a good resection of part of the rectal wall to remove a large pre-cancerous polyp or an early cancer in which the patient cannot tolerate an open surgery or has an extremely hostile abdomen that makes the resection very risky.    The rectum is the last foot of the tubular digestive tract.  It resides in the pelvis, laying on top of your tailbone.  It is the final reservoir for stool before it is evacuated through the anus in the process of defecation, naturally stretching and contracting to allow time for someone to control bowel function.  The rectum is a common location for polyps  or even a cancer.    In most cases when a polyp is found in the colon or rectum, a  person often does not need to have a large resection of the rectum, but have it excised by a colonoscope.  However, some polyps are too large to be safely removed through endoscopy and require surgery.  Classically, this is done through an open incision where a low anterior resection or abdominoperineal resection where part or the entire rectum is removed.  Often, only part of a wall of the rectum needs to be removed.   Transanal endoscopic microsurgery (TEM) was developed as a means to provide a less invasive way to operate on the rectum.    Transanal endoscopic microsurgery (TEM) involves the patient to be placed under complete general anesthesia.  The anus is gently dilated and a metal tube is placed into the rectum.  Through the tube, absorbable gas is infused and long instruments are used to help access and cut out the polyp or tumor from part of the rectum.  The long instruments are also used to help sew the wound shut.  The specimen is then sent for pathology.  The procedure itself usually takes a few hours of time.  The patient stays at least overnight.  When they can tolerate a regular diet and have adequate pain control they usually leave the hospital in one or two days.    The advantage of TEM is that as opposed to a long hospital stay, patient recovery is much faster.  People  less likely to have bowel or other problems.  Careful pre-operative selection is essential to make sure that the patient is an appropriate candidate for the surgery.  Tumors or cancers that are very large or invasive usually are much more difficult to remove by this technique and are not considered the first option.  Persons who are most appropriate for this surgery are those with large polyps that have not become cancers or early cancers in patients who have high risks with larger surgery.   Sometimes a more aggressive laparoscopic or open follow up resection is needed if a cancer is found to give the best chance at pure.     In general, surgery has a better chance at cure if a cancer is found but may not needed if it is only a precancerous polyp.  Risks to the surgery such as pain, bleeding, abscess, leak, ostomy, and death are inherent but overall the TEM procedure is less stressful and less risky to the patient than a classic colorectal resection throughthe abdomen.  Your colorectal surgeon can help decide what option is best for you.

## 2019-05-10 NOTE — Anesthesia Postprocedure Evaluation (Signed)
Anesthesia Post Note  Patient: Stacey Velazquez  Procedure(s) Performed: TEM PARTIAL PROCTECTOMY OF RECTAL MASS WITH EXCISION RIGHT PERINEAL SKIN MASS (N/A )     Patient location during evaluation: PACU Anesthesia Type: General Level of consciousness: sedated and patient cooperative Pain management: pain level controlled Vital Signs Assessment: post-procedure vital signs reviewed and stable Respiratory status: spontaneous breathing Cardiovascular status: stable Anesthetic complications: no    Last Vitals:  Vitals:   05/10/19 1645 05/10/19 1705  BP: 114/70 (!) 103/48  Pulse: 78 76  Resp: 12 14  Temp: (!) 36.4 C 36.4 C  SpO2: 96% 98%    Last Pain:  Vitals:   05/10/19 1725  TempSrc:   PainSc: 0-No pain                 Nolon Nations

## 2019-05-10 NOTE — Interval H&P Note (Signed)
History and Physical Interval Note:  05/10/2019 12:26 PM  Stacey Velazquez  has presented today for surgery, with the diagnosis of BLEEDING RECTAL POLYP.  The various methods of treatment have been discussed with the patient and family. After consideration of risks, benefits and other options for treatment, the patient has consented to  Procedure(s): TEM PARTIAL PROCTECTOMY OF RECTAL MASS (N/A) as a surgical intervention.  The patient's history has been reviewed, patient examined, no change in status, stable for surgery.  I have reviewed the patient's chart and labs.  Questions were answered to the patient's satisfaction.    I have re-reviewed the the patient's records, history, medications, and allergies.  I have re-examined the patient.  I again discussed intraoperative plans and goals of post-operative recovery.  The patient agrees to proceed.  Stacey Velazquez  October 23, 1945 [id number]  Patient Care Team: Rocco Serene, MD as PCP - General (Internal Medicine) [name], MD as PCP - Electrophysiology (Cardiology) [name], MD as Consulting Physician (Cardiology)  Patient Active Problem List   Diagnosis Date Noted   Autoimmune hepatitis [facility])     Priority: High   Rectal polyp     Priority: Medium   Thrombocytopenia due to drugs 08/25/2014    Priority: Medium   Hepatic cirrhosis ([place]) 04/09/2019   Obesity, Class II, BMI 35-39.9 04/09/2019   Hallucinations 03/13/2019   Gastric ulcer    Rectal bleeding 03/05/2019   Vertigo    Hypotension    Heart disease    GERD (gastroesophageal reflux disease)    Anxiety    Low back pain 10/18/2016   Acute right hip pain 08/26/2016   Seizure disorder ([place]) 10/14/2014   Drug-induced Parkinson's disease ([place]) 10/14/2014   Hypothyroidism 09/09/2014   Azathioprine-induced leukopenia ([place]) 08/25/2014   Hepatitis, autoimmune ([place]) 08/25/2014   Syncope    Seizures ([place])    Sinus arrest    Pacemaker Medtronic    Bipolar 1 disorder ([place])     Paranoid schizophrenia ([place]) 03/25/2012   Glaucoma 01/25/2001    Past Medical History:  Diagnosis Date   Adenomatous polyp of ascending colon    Anxiety    Bilateral lower extremity edema    burning sensations   Bipolar 1 disorder (HCC)    Carpal tunnel syndrome    Cirrhosis of liver (HCC)    Cutaneous horn    Drug-induced Parkinsonism ([place])    patient unaware   Esophageal varices ([place])    Fibromyalgia    patient denies   Gastric ulcer    GERD (gastroesophageal reflux disease)    Gingival abscess 08/26/2016   Glaucoma 2003   pt unaware   Heart attack ([place])    Heart disease    Hepatitis, autoimmune ([place]) 08/25/2014   History of iron deficiency anemia    Hypotension    Hypothyroidism    Migraines    Neutropenia (HCC)    Osteoporosis    Pacemaker Medtronic    MRI compatible   Paranoid schizophrenia ([place])    Polyarthritis    Polyosteoarthritis    Rectal mass    Seizures ([place])    last sz 08/21/14   Sinus arrest    Skin cancer    forehead   SSS (sick sinus syndrome) (HCC)    Stroke (HCC)    Thrombocytopenia (HCC)    Vertigo    bvvp    Past Surgical History:  Procedure Laterality Date   BIOPSY  03/06/2019   Procedure: BIOPSY;  Surgeon: Tarri Glenn,  Joelene Millin, MD;  Location: [facility];  Service: Gastroenterology;;  EGD and COLON   CATARACT EXTRACTION Left 2008   pt unaware   CHOLECYSTECTOMY     COLONOSCOPY WITH PROPOFOL N/A 03/06/2019   Procedure: COLONOSCOPY WITH PROPOFOL;  Surgeon: Thornton Park, MD;  Location: [facility];  Service: Gastroenterology;  Laterality: N/A;   ESOPHAGOGASTRODUODENOSCOPY (EGD) WITH PROPOFOL N/A 03/06/2019   Procedure: ESOPHAGOGASTRODUODENOSCOPY (EGD) WITH PROPOFOL ;  Surgeon: Thornton Park, MD;  Location: [facility];  Service: Gastroenterology;  Laterality: N/A;   KNEE SURGERY Left 1985   PACEMAKER INSERTION  2014   PARTIAL HYSTERECTOMY  1979   POLYPECTOMY  03/06/2019   Procedure: POLYPECTOMY;  Surgeon: Thornton Park, MD;   Location: [facility];  Service: Gastroenterology;;   REFRACTIVE SURGERY     TUBAL LIGATION      Social History   Socioeconomic History   Marital status: Widowed    Spouse name: Not on file   Number of children: Not on file   Years of education: Not on file   Highest education level: Not on file  Occupational History   Not on file  Tobacco Use   Smoking status: Former Smoker    Quit date: 03/02/2003    Years since quitting: 16.2   Smokeless tobacco: Never Used  Substance and Sexual Activity   Alcohol use: No   Drug use: No   Sexual activity: Not on file  Other Topics Concern   Not on file  Social History Narrative   Lives alone, "have friends and relatives that come and help me"   caffeine use- coffee -1 cup daily   Social Determinants of Health   Financial Resource Strain: Low Risk    Difficulty of Paying Living Expenses: Not very hard  Food Insecurity: No Food Insecurity   Worried About Charity fundraiser in the Last Year: Never true   Ran Out of Food in the Last Year: Never true  Transportation Needs: Unmet Transportation Needs   Lack of Transportation (Medical): Yes   Lack of Transportation (Non-Medical): Yes  Physical Activity: Inactive   Days of Exercise per Week: 0 days   Minutes of Exercise per Session: 0 min  Stress: Stress Concern Present   Feeling of Stress : To some extent  Social Connections: Somewhat Isolated   Frequency of Communication with Friends and Family: More than three times a week   Frequency of Social Gatherings with Friends and Family: More than three times a week   Attends Religious Services: 1 to 4 times per year   Active Member of [employer] or Organizations: No   Attends Archivist Meetings: Never   Marital Status: Widowed  Human resources officer Violence: Not At Risk   Fear of Current or Ex-Partner: No   Emotionally Abused: No   Physically Abused: No   Sexually Abused: No    Family History  Problem Relation Age of Onset    Heart attack Father    Heart disease Paternal Uncle    Heart disease Paternal Grandmother    Diabetes Paternal Grandmother    Heart disease Paternal Grandfather    Breast cancer Mother    Cancer Maternal Grandmother    CVA Maternal Grandmother    Cancer Maternal Grandfather    CVA Maternal Grandfather    Diabetes Sister    Diabetes Brother    Diabetes Brother    Stroke Daughter     Medications Prior to Admission  Medication Sig Dispense Refill Last Dose   azathioprine (  IMURAN) 100 MG tablet Take 1 tablet (100 mg total) by mouth daily. (Patient taking differently: Take 100 mg by mouth daily in the afternoon. ) 30 tablet 3 05/08/2019   levothyroxine (SYNTHROID) 50 MCG tablet Take 50 mcg by mouth daily before breakfast.   05/10/2019 at 0630   omeprazole (PRILOSEC) 20 MG capsule Take 40 mg by mouth daily before breakfast.    05/09/2019 at 1100   traMADol-acetaminophen (ULTRACET) 37.5-325 MG tablet Take 2 tablets by mouth every 4 (four) hours as needed (pain.).    05/07/2019    Current Facility-Administered Medications  Medication Dose Route Frequency Provider Last Rate Last Admin   bisacodyl (DULCOLAX) EC tablet 20 mg  20 mg Oral Once Michael Boston, MD       bupivacaine liposome (EXPAREL) 1.3 % injection 266 mg  20 mL Infiltration On Call to OR Michael Boston, MD       Chlorhexidine Gluconate Cloth 2 % PADS 6 each  6 each Topical Once Michael Boston, MD       And   Chlorhexidine Gluconate Cloth 2 % PADS 6 each  6 each Topical Once Kourtnie Sachs, Remo Lipps, MD       clindamycin (CLEOCIN) 900 mg, gentamicin (GARAMYCIN) 240 mg in sodium chloride 0.9 % 1,000 mL for intraperitoneal lavage   Irrigation On Call to OR Michael Boston, MD       clindamycin (CLEOCIN) IVPB 900 mg  900 mg Intravenous On Call to OR Michael Boston, MD       gentamicin (GARAMYCIN) 390 mg in dextrose 5 % 100 mL IVPB  5 mg/kg (Adjusted) Intravenous On Call to OR Michael Boston, MD       lactated ringers infusion   Intravenous Continuous  Nolon Nations, MD 75 mL/hr at 05/10/19 1215 New Bag at 05/10/19 1215   neomycin (MYCIFRADIN) tablet 1,000 mg  1,000 mg Oral 3 times per day Michael Boston, MD       And   metroNIDAZOLE (FLAGYL) tablet 1,000 mg  1,000 mg Oral 3 times per day Michael Boston, MD       polyethylene glycol powder (GLYCOLAX/MIRALAX) container 255 g  1 Container Oral Once Michael Boston, MD         Allergies  Allergen Reactions   Neomycin Nausea And Vomiting   Ambien [Zolpidem Tartrate] Other (See Comments)    Hallucinations    Amitriptyline Other (See Comments)    Stops heart per pt   Ampicillin Other (See Comments)    Stops heart per pt    Anaprox [Naproxen Sodium] Other (See Comments)    Doesn't work per pt   Benadryl [Diphenhydramine Hcl (Sleep)] Other (See Comments)    Rash   Cetirizine & Related Other (See Comments)    Swelling    Cortizone-10 [Hydrocortisone] Other (See Comments)    seizures    Darvon [Propoxyphene] Other (See Comments)    Increased heart rate per pt    Diazepam Other (See Comments)    Stops heart per pt   Diflunisal Swelling and Other (See Comments)    Stops heart per pt (Dolobid)   Duloxetine Other (See Comments)    Reaction to Cymbalta - pt doesn't remember what the reaction was   Flexeril [Cyclobenzaprine] Other (See Comments)    Seizures    Lactose Intolerance (Gi) Swelling and Other (See Comments)    cramping   Lidocaine Other (See Comments)    Seizures   Meperidine And Related Other (See Comments)    Stops heart rate (  reaction to Demerol)   Metanx [L-Methylfolate-Algae-B12-B6] Other (See Comments)    Pt does not remember reaction   Metoclopramide Other (See Comments)    Pt does not remember reaction   Morphine And Related Other (See Comments)    Stops heart per pt    Nuprin [Ibuprofen] Other (See Comments)    Caused headache   Oxycodone Other (See Comments)    Hallucinations    Penicillins Other (See Comments)    Stops heart per pt    Propoxyphene  Other (See Comments)    Slowed heart rate per pt (reaction to Darvocet)   Ranitidine Other (See Comments)    Pt does not remember reaction   Vicodin [Hydrocodone-Acetaminophen] Other (See Comments)    Seizures    Tylenol [Acetaminophen] Rash    BP 129/78 (BP Location: Left Arm)   Pulse 81   Temp 98.2 F (36.8 C) (Oral)   Resp 18   SpO2 100%   Labs: Results for orders placed or performed during the hospital encounter of 05/08/19 (from the past 48 hour(s))  Comprehensive metabolic panel     Status: Abnormal   Collection Time: 05/08/19  3:57 PM  Result Value Ref Range   Sodium 135 135 - 145 mmol/L   Potassium 3.6 3.5 - 5.1 mmol/L   Chloride 107 98 - 111 mmol/L   CO2 22 22 - 32 mmol/L   Glucose, Bld 109 (H) 70 - 99 mg/dL    Comment: Glucose reference range applies only to samples taken after fasting for at least 8 hours.   BUN 14 8 - 23 mg/dL   Creatinine, Ser 0.66 0.44 - 1.00 mg/dL   Calcium 8.1 (L) 8.9 - 10.3 mg/dL   Total Protein 7.1 6.5 - 8.1 g/dL   Albumin 2.8 (L) 3.5 - 5.0 g/dL   AST 38 15 - 41 U/L   ALT 22 0 - 44 U/L   Alkaline Phosphatase 68 38 - 126 U/L   Total Bilirubin 0.8 0.3 - 1.2 mg/dL   GFR calc non Af Amer >60 >60 mL/min   GFR calc Af Amer >60 >60 mL/min   Anion gap 6 5 - 15    Comment: Performed at [facility], [place] [address] [place], [place] [address]  CBC     Status: Abnormal   Collection Time: 05/08/19  3:57 PM  Result Value Ref Range   WBC 1.8 (L) 4.0 - 10.5 K/uL   RBC 4.04 3.87 - 5.11 MIL/uL   Hemoglobin 12.8 12.0 - 15.0 g/dL   HCT 39.4 36.0 - 46.0 %   MCV 97.5 80.0 - 100.0 fL   MCH 31.7 26.0 - 34.0 pg   MCHC 32.5 30.0 - 36.0 g/dL   RDW 17.3 (H) 11.5 - 15.5 %   Platelets 99 (L) 150 - 400 K/uL    Comment: SPECIMEN CHECKED FOR CLOTS Immature Platelet Fraction may be clinically indicated, consider ordering this additional test [id number] PLATELET COUNT CONFIRMED BY SMEAR REPEATED TO VERIFY    nRBC 0.0 0.0 - 0.2 %     Comment: Performed at [facility], [place] [address] [place], [place] [address]  Hemoglobin A1c     Status: None   Collection Time: 05/08/19  3:57 PM  Result Value Ref Range   Hgb A1c MFr Bld 5.1 4.8 - 5.6 %    Comment: (NOTE)         Prediabetes: 5.7 - 6.4         Diabetes: >  6.4         Glycemic control for adults with diabetes: <7.0    Mean Plasma Glucose 100 mg/dL    Comment: (NOTE) Performed At: [facility] [place], [place] [id number] Rush Farmer MD VP:[id number]     Imaging / Studies: CUP PACEART INCLINIC DEVICE CHECK  Result Date: 04/23/2019 Pacemaker check in clinic. Normal device function. Thresholds, sensing, impedances consistent with previous measurements. Device programmed to maximize longevity. Occasional AT/SVT noted. Burden very low, short episodes. Occasional NSVT. Last EF normal. Device programmed at appropriate safety margins. Histogram distribution appropriate for patient activity level. Estimated longevity 4 yr, 8 mo. Patient enrolled in remote follow-up. RTC 12 months to see Dr. Caryl Comes  CUP PACEART REMOTE DEVICE CHECK  Result Date: 05/01/2019 Scheduled remote reviewed. Normal device function.  Next remote 91 days- JBox, RN/CVRS    .Adin Hector, M.D., F.A.C.S. Gastrointestinal and Minimally Invasive Surgery [facility] [address] [place], [place] [address] [phone] Main / Paging  05/10/2019 12:26 PM    Adin Hector

## 2019-05-10 NOTE — H&P (Signed)
Stacey Velazquez  DOB: November 14, 1945  Widowed / Language: Cleophus Molt / Race: White  Female   `  `  Patient sent for surgical consultation at the request of Laurier Nancy, [facility]  Chief Complaint: Adenomatous polyp with high-grade dysplasia and distal rectum with a history of cirrhosis. Consider TEM resection  `  `  The patient is an obese woman with multiple medical problems including autoimmune hepatitis progressing to cirrhosis. Has some esophageal varices but no major bleeding. History of colon polyps. Has had colonoscopies in the past. Required EMR resection in 2018. Switch from Dr. Benson Norway to [facility] gastro[facility]ology. Colonoscopy noted numerous polyps removed. However there was a distal rectal polyp was felt not to be easily amenable to resection. Biopsies showing adenomatous polyp. Internally referred to consider endoscopic resection but given high-grade dysplasia TEM resection was recommended. In surgical consultation.  She comes today to wheelchair. She struggles with severe joint pains. She notes her left knee is been very painful more recently. She's not seen an orthopedic surgeon well. She had cholecystectomy 2003. Tubal ligation 1974. No other abdominal surgery. She is lift on [employer] region but has been [place] for a few years. She moves her bowels about maybe once a week. Occasionally does MiraLAX but not consistently. Her main issues been rectal bleeding. Notes clots when she moves her bowels. She does not smoke. No alcohol intake. Her any history of bipolar and anxiety relatively stable. Takes tramadol for chronic joint pain. Recently it has been switched due to worsening discomfort and pain.  No personal nor family history of GI/colon cancer, inflammatory bowel disease, irritable bowel syndrome, allergy such as Celiac Sprue, dietary/dairy problems, colitis, ulcers nor gastritis. No recent sick contacts/gastroenteritis. No travel outside the country. No changes in diet. No dysphagia  to solids or liquids. No significant heartburn or reflux. No melena, hematemesis, coffee ground emesis. No evidence of prior gastric/peptic ulceration.  (Review of systems as stated in this history (HPI) or in the review of systems. Otherwise all other 12 point ROS are negative)  `  `  `  SURGICAL PATHOLOGY  CASE: [id number]  PATIENT: Stacey Velazquez  Surgical Pathology Report  Clinical History: Rectal bleeding, hx of colon polyps, autoimmune  hepatitis, esophageal varices (cm)  FINAL MICROSCOPIC DIAGNOSIS:  A. STOMACH, RANDOM, BIOPSY:  - Antral and oxyntic mucosa with mild chronic gastritis.  - Warthin-Starry negative for Helicobacter pylori.  - No intestinal metaplasia, dysplasia or malignancy.  B. COLON, CECUM, POLYPECTOMY:  - Tubular adenoma.  - No high-grade dysplasia or carcinoma.  C. COLON, ASCENDING, POLYPECTOMY:  - Tubular adenoma.  - No high-grade dysplasia or carcinoma.  D. COLON, DESCENDING, POLYPECTOMY:  - Tubular adenoma.  - No high-grade dysplasia or carcinoma.  - Hyperplastic polyp.  E. COLON, SIGMOID, POLYPECTOMY:  - Hyperplastic polyp (s).  - No adenomatous change or carcinoma.  F. RECTUM, BIOPSY:  - Fragments of tubulovillous adenoma with high-grade glandular  dysplasia.  - See comment.  COMMENT:  There are multiple fragments of tubulovillous adenoma with foci of  high-grade glandular dysplasia. Invasive carcinoma is not identified in  these biopsies.  Call to Dr. Tarri Glenn on 03/07/2019.  Lilly Gasser DESCRIPTION:  A: Received in formalin are tan, soft tissue fragments that are  submitted in toto. Number: 6 size: 0.3-0.6 cm blocks: 1  B: Received in formalin are tan, soft tissue fragments that are  submitted in toto. Number: 2 size: 0.6 and 1.3 cm blocks: 1  C: Received in formalin are tan,  soft tissue fragments that are  submitted in toto. Number: 2 size: 1.0 x 1.2 cm blocks: 1  D: Received in formalin are tan, soft tissue fragments that are  submitted in  toto. Number: 2 size: 0.7 and 0.8 cm blocks: 1  E: Received in formalin are tan, soft tissue fragments that are  submitted in toto. Number: 2 size: 0.4 and 0.5 cm blocks: 1  F: Received in formalin are tan, soft tissue fragments that are  submitted in toto. Number: 8 size: 0.3-0.4 cm blocks: 1 (GRP 03/06/2019)  Final Diagnosis performed by Claudette Laws, MD. Electronically signed  03/07/2019  Technical and / or Professional components performed at [facility], [place] [address] [place], [place] [address].  Immunohistochemistry Technical component (if applicable) was performed  at [facility]. [address] [employer] [place],  [place], [place] [address]. IMMUNOHISTOCHEMISTRY DISCLAIMER (if applicable):  Some of these immunohistochemical stains may have been developed and the  performance characteristics determine by [facility]. Some  may not have been cleared or approved by the U.S. Food and Drug  Administration. The FDA has determined that such clearance or approval  is not necessary. This test is used for clinical purposes. It should not  be regarded as investigational or for research. This laboratory is  certified under the [place]  (CLIA-88) as qualified to perform high complexity clinical laboratory  testing. The controls stained appropriately.  This patient encounter took 50 minutes today to perform the following: obtain history, perform exam, review outside records, interpret tests & imaging, counsel the patient on their diagnosis; and, document this encounter, including findings & plan in the electronic health record (EHR).  Past Surgical History (Chanel Teressa Senter, CMA; 04/09/2019 10:52 AM)  Colon Polyp Removal - Colonoscopy  Gallbladder Surgery - Open  Hysterectomy (not due to cancer) - Partial  Tonsillectomy  Diagnostic Studies History (Chanel Teressa Senter, CMA; 04/09/2019 10:52 AM)  Colonoscopy within last  year  Mammogram 1-3 years ago  Pap Smear >5 years ago  Allergies (Chanel Teressa Senter, CMA; 04/09/2019 10:52 AM)  Ambien *HYPNOTICS/SEDATIVES/SLEEP DISORDER AGENTS* shortness of breath  Amitriptyline HCl *ANTIDEPRESSANTS* Hives  Ampicillin *PENICILLINS* Hives  Anaprox *ANALGESICS - ANTI-INFLAMMATORY* Rash  Benadryl *ANTIHISTAMINES*  Cetirizine HCl *ANTIHISTAMINES*  Cortisone *CORTICOSTEROIDS*  Darvon *ANALGESICS - OPIOID* Nausea and vomiting  DiazePAM *ANTIANXIETY AGENTS* Hives  Diflunisal *ANALGESICS - NonNarcotic* Swelling  DULoxetine HCl *ANTIDEPRESSANTS* nausea  Flexeril *MUSCULOSKELETAL THERAPY AGENTS* hives  Lactose Intolerance *DIGESTIVE AIDS* swelling  Lidocaine *CHEMICALS* Swelling  Allergies Reconciled  Medication History (Chanel Nolan, CMA; 04/09/2019 10:53 AM)  traMADol HCl (50MG  Tablet, Oral) Active.  AzaTHIOprine (50MG  Tablet, Oral daily) Active.  Levothyroxine Sodium (100MCG Tablet, Oral daily) Active.  Omeprazole (20MG  Capsule DR, Oral) Active.  Medications Reconciled  Social History (Chanel Teressa Senter, CMA; 04/09/2019 10:52 AM)  No alcohol use  No caffeine use  No drug use  Tobacco use Former smoker.  Family History ([place], [place]; 04/09/2019 10:52 AM)  Alcohol Abuse Brother, Family Members In General, Father, Sister.  Arthritis Family Members In General, Mother.  Breast Cancer Mother.  Cervical Cancer Family Members In General.  Colon Cancer Family Members In General.  Colon Polyps Family Members In General.  Depression Father, Mother.  Diabetes Mellitus Brother, Family Members In General.  Heart Disease Daughter, Family Members In [place], Father.  Heart disease in female family member before age 36  Hypertension Brother, Daughter, Father, Mother, Sister.  Migraine Headache Family Members In General.  Ovarian Cancer Sister.  Seizure disorder Family Members In General.  Thyroid problems Daughter, Family Members In General.  Pregnancy / Birth History Antonietta Jewel,  [place]; 04/09/2019 10:52 AM)  Age at menarche 18 years.  Age of menopause <44  Contraceptive History Intrauterine device, Oral contraceptives.  Gravida 3  Maternal age 46-20  Para 2  Other Problems (Chanel Teressa Senter, CMA; 04/09/2019 10:52 AM)  Anxiety Disorder  Arthritis  Back Pain  Cerebrovascular Accident  Chest pain  Cholelithiasis  Cirrhosis Of Liver  Gastroesophageal Reflux Disease  Migraine Headache  Myocardial infarction  Seizure Disorder  Thyroid Disease  Review of Systems (Chanel Nolan CMA; 04/09/2019 10:52 AM)  General Present- Appetite Loss and Fatigue. Not Present- Chills, Fever, Night Sweats, Weight Gain and Weight Loss.  Skin Present- Dryness. Not Present- Change in Wart/Mole, Hives, Jaundice, New Lesions, Non-Healing Wounds, Rash and Ulcer.  HEENT Present- Wears glasses/contact lenses. Not Present- Earache, Hearing Loss, Hoarseness, Nose Bleed, Oral Ulcers, Ringing in the Ears, Seasonal Allergies, Sinus Pain, Sore Throat, Visual Disturbances and Yellow Eyes.  Respiratory Not Present- Bloody sputum, Chronic Cough, Difficulty Breathing, Snoring and Wheezing.  Breast Not Present- Breast Mass, Breast Pain, Nipple Discharge and Skin Changes.  Cardiovascular Present- Leg Cramps and Swelling of Extremities. Not Present- Chest Pain, Difficulty Breathing Lying Down, Palpitations, Rapid Heart Rate and Shortness of Breath.  Gastrointestinal Present- Abdominal Pain, Bloating, Bloody Stool, Change in Bowel Habits, Constipation, Excessive gas, Gets full quickly at meals, Indigestion and Rectal Pain. Not Present- Chronic diarrhea, Difficulty Swallowing, Hemorrhoids, Nausea and Vomiting.  Female Genitourinary Present- Pelvic Pain. Not Present- Frequency, Nocturia, Painful Urination and Urgency.  Musculoskeletal Present- Back Pain, Joint Pain, Muscle Weakness and Swelling of Extremities. Not Present- Joint Stiffness and Muscle Pain.  Neurological Present- Tingling, Tremor, Trouble walking and  Weakness. Not Present- Decreased Memory, Fainting, Headaches, Numbness and Seizures.  Psychiatric Present- Bipolar and Change in Sleep Pattern. Not Present- Anxiety, Depression, Fearful and Frequent crying.  Endocrine Not Present- Cold Intolerance, Excessive Hunger, Hair Changes, Heat Intolerance, Hot flashes and New Diabetes.  Hematology Present- Easy Bruising. Not Present- Blood Thinners, Excessive bleeding, Gland problems, HIV and Persistent Infections.  Vitals (Chanel Nolan CMA; 04/09/2019 10:53 AM)  04/09/2019 10:53 AM  Height: 69 in  Temp.: 97.9 F Pulse: 79 (Regular)  BP: 126/76 (Sitting, Left Arm, Standard)  Physical Exam Adin Hector MD; 04/09/2019 3:12 PM)  General  Mental Status - Alert.  General Appearance - Not in acute distress, Not Sickly.  Orientation - Oriented X3.  Hydration - Well hydrated.  Voice - Normal.  Note: Sitting in wheelchair. Moves very slowly due to severe left knee and other joint pain.  Integumentary  Global Assessment  Upon inspection and palpation of skin surfaces of the - Axillae: non-tender, no inflammation or ulceration, no drainage. and Distribution of scalp and body hair is normal.  General Characteristics  Temperature - normal warmth is noted.  Head and Neck  Head - normocephalic, atraumatic with no lesions or palpable masses.  Face  Global Assessment - atraumatic, no absence of expression.  Neck  Global Assessment - no abnormal movements, no bruit auscultated on the right, no bruit auscultated on the left, no decreased range of motion, non-tender.  Trachea - midline.  Thyroid  Gland Characteristics - non-tender.  Eye  Eyeball - Left - Extraocular movements intact, No Nystagmus - Left.  Eyeball - Right - Extraocular movements intact, No Nystagmus - Right.  Cornea - Left - No Hazy - Left.  Cornea - Right - No Hazy - Right.  Sclera/Conjunctiva - Left - No scleral icterus, No Discharge - Left.  Sclera/Conjunctiva - Right - No scleral  icterus, No Discharge - Right.  Pupil - Left - Direct reaction to light normal.  Pupil - Right - Direct reaction to light normal.  ENMT  Ears  Pinna - Left - no drainage observed, no generalized tenderness observed. Pinna - Right - no drainage observed, no generalized tenderness observed.  Nose and Sinuses  External Inspection of the Nose - no destructive lesion observed. Inspection of the nares - Left - quiet respiration. Inspection of the nares - Right - quiet respiration.  Mouth and Throat  Lips - Upper Lip - no fissures observed, no pallor noted. Lower Lip - no fissures observed, no pallor noted. Nasopharynx - no discharge present. Oral Cavity/Oropharynx - Tongue - no dryness observed. Oral Mucosa - no cyanosis observed. Hypopharynx - no evidence of airway distress observed.  Chest and Lung Exam  Inspection  Movements - Normal and Symmetrical. Accessory muscles - No use of accessory muscles in breathing.  Palpation  Palpation of the chest reveals - Non-tender.  Auscultation  Breath sounds - Normal and Clear.  Cardiovascular  Auscultation  Rhythm - Regular. Murmurs & Other Heart Sounds - Auscultation of the heart reveals - No Murmurs and No Systolic Clicks.  Abdomen  Inspection  Inspection of the abdomen reveals - No Visible peristalsis and No Abnormal pulsations. Umbilicus - No Bleeding, No Urine drainage.  Palpation/Percussion  Palpation and Percussion of the abdomen reveal - Soft, Non Tender, No Rebound tenderness, No Rigidity (guarding) and No Cutaneous hyperesthesia.  Note: Abdomen obese but soft. Small infraumbilical old bruise. Soft. Not distended. Moderate panniculus back clean with good hygiene. No caput medusa. No telangiectasias. No diastasis recti. No umbilical or other anterior abdominal wall hernias  Female Genitourinary  Sexual Maturity  Tanner 5 - Adult hair pattern.  Note: No vaginal bleeding nor discharge  Rectal  Note: Perianal skin clear. No pruritus. No warts.  No pilonidal disease. No sphincter tone. No external hemorrhoids. No fissure. No fistula.  Pedunculated polypoid mass felt 2.5 cm from anal verge. Posterior midline.  Peripheral Vascular  Upper Extremity  Inspection - Left - No Cyanotic nailbeds - Left, Not Ischemic. Inspection - Right - No Cyanotic nailbeds - Right, Not Ischemic.  Neurologic  Neurologic evaluation reveals - normal attention span and ability to concentrate, able to name objects and repeat phrases. Appropriate fund of knowledge , normal sensation and normal coordination.  Mental Status  Affect - not angry, not paranoid.  Cranial Nerves - Normal Bilaterally.  Gait - Normal.  Neuropsychiatric  Mental status exam performed with findings of - able to articulate well with normal speech/language, rate, volume and coherence, thought content normal with ability to perform basic computations and apply abstract reasoning and no evidence of hallucinations, delusions, obsessions or homicidal/suicidal ideation.  Musculoskeletal  Global Assessment  Spine, Ribs and Pelvis - no instability, subluxation or laxity. Right Upper Extremity - no instability, subluxation or laxity.  Note: Was very slowly. Left knee very sensitive but no obvious drawer sign or fracture.  Lymphatic  Head & Neck  General Head & Neck Lymphatics: Bilateral - Description - No Localized lymphadenopathy.  Axillary  General Axillary Region: Bilateral - Description - No Localized lymphadenopathy.  Femoral & Inguinal  Generalized Femoral & Inguinal Lymphatics: Left - Description - No Localized lymphadenopathy. Right - Description - No Localized lymphadenopathy.  Assessment &  Plan Adin Hector MD; 04/09/2019 3:12 PM)  ADENOMATOUS RECTAL POLYP (D12.8)  Impression: Adenomatous rectal polyp with bleeding in the setting of autoimmune hepatitis steroid-dependent cirrhosis. No major portal hypertension.  Standard of care would be removal. Not felt to be amenable to endoscopic  resection. We do TEM partial proctectomy. Usually can be an outpatient surgery this distally, the most likely would observe at least overnight given her numerous comorbidities her operative risks are increased but her bleeding is not mild and I think we're going to have to take the risk of proceeding with surgery.  Would like a cardiac clearance given her history of sinus arrest and syncope with [employer] pacemaker placement. Usually followed by [facility]. Dr. Virl Axe  Current Plans  Pt Education - Polyps in the Colon and Rectum (Colonic and Rectal Polyps): colonic polyps  Pt Education - CCS TEM Education (Bethsaida Siegenthaler): discussed with patient and provided information.  PREOP COLON - ENCOUNTER FOR PREOPERATIVE EXAMINATION FOR GENERAL SURGICAL PROCEDURE (Z01.818)  Current Plans  You are being scheduled for surgery - Our schedulers will call you.  You should hear from our office's scheduling department within 5 working days about the location, date, and time of surgery. We try to make accommodations for patient's preferences in scheduling surgery, but sometimes the OR schedule or the surgeon's schedule prevents [place] from making those accommodations.  If you have not heard from our office [phone]) in 5 working days, call the office and ask for your surgeon's nurse.  If you have other questions about your diagnosis, plan, or surgery, call the office and ask for your surgeon's nurse.  Written instructions provided  The anatomy & physiology of the digestive tract was discussed. The pathophysiology of the rectal pathology was discussed. Natural history risks without surgery was discussed. I feel the risks of no intervention will lead to serious problems that outweigh the operative risks; therefore, I recommended surgery.  Laparoscopic & open abdominal techniques were discussed. I recommended we start with a partial proctectomy by transanal endoscopic microsurgery (TEM) for excisional biopsy to  remove the pathology and hopefully cure and/or control the pathology. This technique can offer less operative risk and faster post-operative recovery. Possible need for immediate or later abdominal surgery for further treatment was discussed.  Risks such as bleeding, abscess, reoperation, ostomy, heart attack, death, and other risks were discussed. I noted a good likelihood this will help address the problem. Goals of post-operative recovery were discussed as well. We will work to minimize complications. An educational handout was given as well. Questions were answered. The patient expresses understanding & wishes to proceed with surgery.  Pt Education - CCS Colon Bowel Prep 2018 ERAS/Miralax/Antibiotics  Started Neomycin Sulfate 500 MG Oral Tablet, 2 (two) Tablet SEE NOTE, #6, 04/09/2019, No Refill.  Local Order:  Pharmacist Notes: TAKE TWO TABLETS AT 2 PM, 3 PM, AND 10 PM THE DAY PRIOR TO SURGERY  Started Flagyl 500 MG Oral Tablet, 2 (two) Tablet SEE NOTE, #6, 04/09/2019, No Refill.  Local Order:  Pharmacist Notes: Take at 2pm, 3pm, and 10pm the day prior to your colon operation  Pt Education - Pamphlet Given - Laparoscopic Colorectal Surgery: discussed with patient and provided information.  Pt Education - CCS Colectomy post-op instructions: discussed with patient and provided information.  PACEMAKER (Z95.0)  Impression: History of pacemaker for syncopal event when she lived in [place]. Has been intermittently investigated by cardiology. Dr. Caryl Comes follows. I would definitely want cardiac clearance before proceeding with  surgery. I think we are somewhat stuck, but would like to try and make sure we can minimize risks  Current Plans  I recommended obtaining preoperative cardiac clearance. I am concerned about the health of the patient and the ability to tolerate the operation. Therefore, we will request clearance by cardiology to better assess operative risk & see if a reevaluation, further workup,  etc is needed. Also recommendations on how medications such as for anticoagulation and blood pressure should be managed/held/restarted after surgery.  THROMBOCYTOPENIA (D69.6)  Impression: Thrombocytopenia with platelet counts and 60s-70s. Probably did not need to transfuse periop unless gets below 50. Recheck labs preoperatively  CHRONIC PAIN OF LEFT KNEE (M25.562)  Impression: Worsening pain and left knee. Recommended she consider evaluation by orthopedics. She had seen [facility] in the past but cannot remember which physician. Defer to the patient's primary care physician.  Adin Hector, MD, FACS, MASCRS  Gastrointestinal and Minimally Invasive Surgery  [facility]  [address] [place]  [place], [place] [address]  [phone] Main / Paging  [phone] Fax

## 2019-05-10 NOTE — Anesthesia Procedure Notes (Signed)
Procedure Name: Intubation Date/Time: 05/10/2019 2:13 PM Performed by: Gerald Leitz, CRNA Pre-anesthesia Checklist: Patient identified, Patient being monitored, Timeout performed, Emergency Drugs available and Suction available Patient Re-evaluated:Patient Re-evaluated prior to induction Oxygen Delivery Method: Circle system utilized Preoxygenation: Pre-oxygenation with 100% oxygen Induction Type: IV induction Ventilation: Mask ventilation without difficulty Laryngoscope Size: Mac and 3 Grade View: Grade I Tube type: Oral Tube size: 7.0 mm Number of attempts: 1 Airway Equipment and Method: Stylet Placement Confirmation: ETT inserted through vocal cords under direct vision,  positive ETCO2 and breath sounds checked- equal and bilateral Secured at: 21 cm Tube secured with: Tape Dental Injury: Teeth and Oropharynx as per pre-operative assessment

## 2019-05-10 NOTE — Op Note (Signed)
05/10/2019  4:12 PM  PATIENT:  Stacey Velazquez  74 y.o. female  Patient Care Team: Rocco Serene, MD as PCP - General (Internal Medicine) [name], MD as PCP - Electrophysiology (Cardiology) [name], MD as Consulting Physician (Cardiology) Michael Boston, MD as Consulting Physician (General Surgery)  PRE-OPERATIVE DIAGNOSIS:  BLEEDING RECTAL POLYP  POST-OPERATIVE DIAGNOSIS:   BLEEDING RECTAL POLYP WITH HIGH GRADE DYSPLASIA Grade 4 right posterior and left posterior hemorrhoidS RIGHT PERINEAL SKIN MASS  PROCEDURE:   TEM PARTIAL PROCTECTOMY OF RECTAL MASS EXCISION OF PERINEAL SKIN MASS Hemorrhoidal ligation x2  SURGEON:  Adin Hector, MD  ASSISTANT: Fran Lowes, PA-S, [employer]   ANESTHESIA:   local and general  EBL:  Total I/O In: 1200 [I.V.:1200] Out: 145 [Urine:75; Blood:70]  Delay start of Pharmacological VTE agent (>24hrs) due to surgical blood loss or risk of bleeding:  no  DRAINS: none   SPECIMEN:   Adenomatous rectal polyp - pins as noted below.  Also with distal extra margin of anal canal verge  Right perineal pedunculated skin mass.  DISPOSITION OF SPECIMEN:  PATHOLOGY  COUNTS:  YES  PLAN OF CARE: Admit for overnight observation  PATIENT DISPOSITION:  PACU - hemodynamically stable.  INDICATION: Patient with early cirrhosis and known autoimmune hepatitis.  Found to have bleeding distal rectal polyp not amenable to endoscopic resection.  Request made for transanal resection  The anatomy & physiology of the digestive tract was discussed.  The pathophysiology of the rectal pathology was discussed.  Natural history risks without surgery was discussed.   I feel the risks of no intervention will lead to serious problems that outweigh the operative risks; therefore, I recommended surgery.    Laparoscopic & open abdominal techniques were discussed.  I recommended we start with a partial proctectomy by transanal endoscopic microsurgery  (TEM) for excisional biopsy to remove the pathology and hopefully cure and/or control the pathology.  This technique can offer less operative risk and faster post-operative recovery.  Possible need for immediate or later abdominal surgery for further treatment was discussed.   Risks such as bleeding, abscess, reoperation, ostomy, heart attack, death, and other risks were discussed.   I noted a good likelihood this will help address the problem.  Goals of post-operative recovery were discussed as well.  We will work to minimize complications.  An educational handout was given as well.  Questions were answered.  The patient expresses understanding & wishes to proceed with surgery.  OR FINDINGS: Friable posterior midline distal rectal polyp 3 x 3 cm easily friable.  The resulting mass was 5 x5 cm in size  Pin placement on pathology specimen: Proximal margin: Pink Distal margin: Black Right lateral: White Left lateral :  Yellow   Additional anal canal/hemorrhoid margin to anal verge with green pins  The closure rests 0-2 cm from the anal verge in the posterior location.  It is 40% of the circumference.  DESCRIPTION: Informed consent was confirmed.  Patient received general anesthesia without difficulty.  Foley catheter sterilely placed.  Sequential compression devices active during the entire case.  The patient was placed in the prone position, taking extra care to secure and protect the patient appropriately.  The perineum and perianal regions were prepped and draped in a sterile fashion.  Surgical timeout confirmed our plan.  I did a gentle digital rectal examination with gradual anal dilation to allow placement of the 4 cm TEO Stortz scope.  This was secured to the bed  using the TEO clamping system.  We induced carbon dioxide insufflation intraluminally.  I oriented the Stortz scope and the patient such that the specimen rested towards the floor at the 6:00 position.  I could easily identify the  mass.  I went ahead and used tip point cautery to mark 1 cm margins circumferentially.  I then did a full thickness transection at the distal margin.  I came around laterally.  Switched over to harmonic dissection.  Lifted the specimen off the pelvic canal for a good deep margin.  I gradually came more proximally.  Transected at the proximal margin.  I ensured hemostasis.  I inspected the main specimen and pinned it on thick cork board.  Pins as noted above.  I went back and excised an arc of the posterior anal canal and some hemorrhoidal tissue for better distal margin since the polyp came up to the anorectal margin.  I walked the specimen down to pathology and showed the [facility] pathology team for proper orientation.    I went back in and scrubbed in.  Hemostasis was good.  I reapproximated the wound with a 2-0 vicryl horizontal mattress stitch to bring the middle part of the rectum down to help close the middle of the wound.  I then closed the wound using interrupted horizontal mattress sutures of 2-0 Vicryl.  I used this to help ligate the right posterior and left lateral grade 4 hemorrhoids as well down to a much more normal size.  This brought things together well.  There was some pulling of the rectum at the posterior midline but this was controlled with a figure-of-eight suture.  While she did have engorged hemorrhoid tissues and rectal wall, we were able to get hemostasis with ligation.  I did meticulous inspection with fine tip instruments to confirm good watertight closure   Hemostasis excellent.  The lumen was quite patent.  Carbon dioxide evacuated & instruments removed.  The patient is being extubated go to recovery room.  I discussed operative findings, updated the patient's status, discussed probable steps to recovery, and gave postoperative recommendations to the patient's friend & caregiver, Oletta Cohn.  Recommendations were made.  Questions were answered.  She expressed understanding &  appreciation.   Adin Hector, MD, FACS, MASCRS Gastrointestinal and Minimally Invasive Surgery    [address] [place], [place] [address] [phone] Main / Paging [phone] Fax

## 2019-05-11 LAB — BASIC METABOLIC PANEL
Anion gap: 5 (ref 5–15)
BUN: 14 mg/dL (ref 8–23)
CO2: 21 mmol/L — ABNORMAL LOW (ref 22–32)
Calcium: 8 mg/dL — ABNORMAL LOW (ref 8.9–10.3)
Chloride: 107 mmol/L (ref 98–111)
Creatinine, Ser: 0.66 mg/dL (ref 0.44–1.00)
GFR calc Af Amer: >60 mL/min (ref >60)
GFR calc non Af Amer: >60 mL/min (ref >60)
Glucose, Bld: 137 mg/dL — ABNORMAL HIGH (ref 70–99)
Potassium: 3.4 mmol/L — ABNORMAL LOW (ref 3.5–5.1)
Sodium: 133 mmol/L — ABNORMAL LOW (ref 135–145)

## 2019-05-11 LAB — CBC
HCT: 35.9 % — ABNORMAL LOW (ref 36.0–46.0)
Hemoglobin: 11.8 g/dL — ABNORMAL LOW (ref 12.0–15.0)
MCH: 31.8 pg (ref 26.0–34.0)
MCHC: 32.9 g/dL (ref 30.0–36.0)
MCV: 96.8 fL (ref 80.0–100.0)
Platelets: 74 10*3/uL — ABNORMAL LOW (ref 150–400)
RBC: 3.71 MIL/uL — ABNORMAL LOW (ref 3.87–5.11)
RDW: 17.4 % — ABNORMAL HIGH (ref 11.5–15.5)
WBC: 4 10*3/uL (ref 4.0–10.5)
nRBC: 0 % (ref 0.0–0.2)

## 2019-05-11 LAB — MAGNESIUM: Magnesium: 1.7 mg/dL (ref 1.7–2.4)

## 2019-05-11 NOTE — Progress Notes (Signed)
Stacey Velazquez [id number] 1945-04-22  CARE TEAM:  PCP: Rocco Serene, MD  Outpatient Care Team: Patient Care Team: Rocco Serene, MD as PCP - General (Internal Medicine) [name], MD as PCP - Electrophysiology (Cardiology) [name], MD as Consulting Physician (Cardiology) Michael Boston, MD as Consulting Physician (General Surgery)  Inpatient Treatment Team: Treatment Team: Attending Provider: Michael Boston, MD; Registered Nurse: Kai Levins, RN; Utilization Review: Beau Fanny, RN; Registered Nurse: Donia Ast, RN; Physical Therapist: Fuller Mandril, PT   Problem List:   Active Problems:   Adenomatous rectal polyp   Adenomatous polyp of rectum   1 Day Post-Op  05/10/2019  Procedure(s): TEM PARTIAL PROCTECTOMY OF RECTAL MASS WITH EXCISION RIGHT PERINEAL SKIN MASS    Assessment  Bleeding rectal polyp- TEM partial proctectomy POD#1 Cirrhosis  [facility] Stay = 1 days)  Plan:  -Advance diet as tolerated -PT/OT evaluation -d/c IVF -VTE prophylaxis- SCDs, etc -mobilize as tolerated to help recovery  30 minutes spent in review, evaluation, examination, counseling, and coordination of care.  More than 50% of that time was spent in counseling.  05/11/2019    Subjective: Bleeding rectal polyp  Patient slightly groggy but expressed relief that she is not having any pain. Patient has moved bowels and urinated. Has been able to ambulate to the bathroom with assistance.   Objective:  Vital signs:  Vitals:   05/10/19 2205 05/11/19 0139 05/11/19 0140 05/11/19 0530  BP: 125/78 120/77  121/73  Pulse: 81 70  85  Resp: 18 18  20   Temp: 97.6 F (36.4 C) (!) 97.5 F (36.4 C)  97.6 F (36.4 C)  TempSrc: Oral Oral  Oral  SpO2: 98% 96%  99%  Weight:   105.9 kg   Height:        Last BM Date: 05/10/19  Intake/Output   Yesterday:  04/15 0701 - 04/16 0700 In: 1757.3 [I.V.:1597.5; IV Piggyback:159.8] Out: 145 [Urine:75; Blood:70] This  shift:  No intake/output data recorded.  Bowel function:  Flatus: YES  BM:  YES  Drain: (No drain)   Physical Exam:  General: Pt awake/alert in no acute distress Eyes: PERRL, normal EOM.  Sclera clear.  No icterus Neuro: CN II-XII intact w/o focal sensory/motor deficits. Lymph: No head/neck/groin lymphadenopathy Psych:  No delerium/psychosis/paranoia.  Oriented x 4 HENT: Normocephalic, Mucus membranes moist.  No thrush Neck: Supple, No tracheal deviation.  No obvious thyromegaly Chest: No pain to chest wall compression.  Good respiratory excursion.  No audible wheezing CV:  Pulses intact.  Regular rhythm.  No major extremity edema MS: Normal AROM mjr joints.  No obvious deformity  Abdomen: Soft.  Nondistended.  Nontender.  No evidence of peritonitis.  No incarcerated hernias.  Ext:   No deformity.  No mjr edema.  No cyanosis Skin: No petechiae / purpurea.  No major sores.  Warm and dry    Results:   Cultures: Recent Results (from the past 720 hour(s))  SARS CORONAVIRUS 2 (TAT 6-24 HRS) Nasopharyngeal Nasopharyngeal Swab     Status: None   Collection Time: 05/07/19 10:43 AM   Specimen: Nasopharyngeal Swab  Result Value Ref Range Status   SARS Coronavirus 2 NEGATIVE NEGATIVE Final    Comment: (NOTE) SARS-CoV-2 target nucleic acids are NOT DETECTED. The SARS-CoV-2 RNA is generally detectable in upper and lower respiratory specimens during the acute phase of infection. Negative results do not preclude SARS-CoV-2 infection, do not rule out co-infections with other pathogens, and should  not be used as the sole basis for treatment or other patient management decisions. Negative results must be combined with clinical observations, patient history, and epidemiological information. The expected result is Negative. Fact Sheet for Patients: SugarRoll.be Fact Sheet for Healthcare Providers: https://www.woods-mathews.com/ This test is not  yet approved or cleared by the [place] FDA and  has been authorized for detection and/or diagnosis of SARS-CoV-2 by FDA under an Emergency Use Authorization (EUA). This EUA will remain  in effect (meaning this test can be used) for the duration of the COVID-19 declaration under Section 56 4(b)(1) of the Act, 21 U.S.C. section 360bbb-3(b)(1), unless the authorization is terminated or revoked sooner. Performed at [facility], [place] [address] [place], [place] [address]     Labs: Results for orders placed or performed during the hospital encounter of 05/10/19 (from the past 48 hour(s))  Basic metabolic panel     Status: Abnormal   Collection Time: 05/11/19  4:46 AM  Result Value Ref Range   Sodium 133 (L) 135 - 145 mmol/L   Potassium 3.4 (L) 3.5 - 5.1 mmol/L   Chloride 107 98 - 111 mmol/L   CO2 21 (L) 22 - 32 mmol/L   Glucose, Bld 137 (H) 70 - 99 mg/dL    Comment: Glucose reference range applies only to samples taken after fasting for at least 8 hours.   BUN 14 8 - 23 mg/dL   Creatinine, Ser 0.66 0.44 - 1.00 mg/dL   Calcium 8.0 (L) 8.9 - 10.3 mg/dL   GFR calc non Af Amer >60 >60 mL/min   GFR calc Af Amer >60 >60 mL/min   Anion gap 5 5 - 15    Comment: Performed at [facility], [place] [address], [place], [place] [address]  CBC     Status: Abnormal   Collection Time: 05/11/19  4:46 AM  Result Value Ref Range   WBC 4.0 4.0 - 10.5 K/uL   RBC 3.71 (L) 3.87 - 5.11 MIL/uL   Hemoglobin 11.8 (L) 12.0 - 15.0 g/dL   HCT 35.9 (L) 36.0 - 46.0 %   MCV 96.8 80.0 - 100.0 fL   MCH 31.8 26.0 - 34.0 pg   MCHC 32.9 30.0 - 36.0 g/dL   RDW 17.4 (H) 11.5 - 15.5 %   Platelets 74 (L) 150 - 400 K/uL    Comment: CONSISTENT WITH PREVIOUS RESULT Immature Platelet Fraction may be clinically indicated, consider ordering this additional test [id number]    nRBC 0.0 0.0 - 0.2 %    Comment: Performed at [facility], [place] [address], [place], [place] [address]   Magnesium     Status: None   Collection Time: 05/11/19  4:46 AM  Result Value Ref Range   Magnesium 1.7 1.7 - 2.4 mg/dL    Comment: Performed at [facility], [place] [address] [place], [place] [address]    Imaging / Studies: No results found.  Medications / Allergies: per chart  Antibiotics: Anti-infectives (From admission, onward)   Start     Dose/Rate Route Frequency Ordered Stop   05/10/19 2200  clindamycin (CLEOCIN) IVPB 900 mg     900 mg 100 mL/hr over 30 Minutes Intravenous Every 8 hours 05/10/19 1709 05/11/19 0811   05/10/19 1400  neomycin (MYCIFRADIN) tablet 1,000 mg  Status:  Discontinued     1,000 mg Oral 3 times per day 05/10/19 1118 05/10/19 1708   05/10/19 1400  metroNIDAZOLE (FLAGYL) tablet 1,000 mg  Status:  Discontinued     1,000 mg Oral 3 times per day 05/10/19 1118 05/10/19 1708   05/10/19 1130  clindamycin (CLEOCIN) IVPB 900 mg     900 mg 100 mL/hr over 30 Minutes Intravenous On call to O.R. 05/10/19 1117 05/10/19 1414   05/10/19 0600  clindamycin (CLEOCIN) 900 mg, gentamicin (GARAMYCIN) 240 mg in sodium chloride 0.9 % 1,000 mL for intraperitoneal lavage  Status:  Discontinued      Irrigation On call to O.R. 05/09/19 0802 05/10/19 1708   05/10/19 0600  gentamicin (GARAMYCIN) 390 mg in dextrose 5 % 100 mL IVPB     5 mg/kg  78.8 kg (Adjusted) 109.8 mL/hr over 60 Minutes Intravenous On call to O.R. 05/09/19 0804 05/10/19 1417        Note: Portions of this report may have been transcribed using voice recognition software. Every effort was made to ensure accuracy; however, inadvertent computerized transcription errors may be present.   Any transcriptional errors that result from this process are unintentional.     Autum Benfer Sydnee Levans PA-S, [facility] Adin Hector, MD, FACS, MASCRS Gastrointestinal and Minimally Invasive Surgery    [address] [place], [place] [address] [phone] Main / Paging [phone]  Fax Please see Amion for pager number, especial 5pm - 7am.

## 2019-05-11 NOTE — Evaluation (Signed)
Occupational Therapy Evaluation Patient Details Name: Stacey Velazquez MRN: [id number] DOB: 1945-12-15 Today's Date: 05/11/2019    History of Present Illness Pt is 74 yo f s/p TEM PARTIAL PROCTECTOMY OF RECTAL MASS on 05/10/19. PMH includes, but not limited to, autoimmune hepatitis progressing to cirrhosis, paranoid schizophrenia, bipolar, polyarthritis, glaucoma.    Clinical Impression   Pt admitted with the above diagnoses and presents with below problem list. Pt will benefit from continued acute OT to address the below listed deficits and maximize independence with basic ADLs prior to d/c home. At baseline, pt is mod I with ADLs, uses rollator, extra time and effort for transfers and mobility. Pt reports she lives in an ILF and is able to make her own simple meals and manage her medications. She reports she has some who assists with grocery shopping and transportation. She utilizes a w/c when out in the community. Pt currently min guard to min A with LB ADLs, and toilet transfers, min guard for bathroom distance functional mobility. Pt min A to steady on initial stand, min guard for sebquent transfers.      Follow Up Recommendations  Home health OT;Supervision - Intermittent    Equipment Recommendations  None recommended by OT    Recommendations for Other Services PT consult     Precautions / Restrictions Restrictions Weight Bearing Restrictions: No      Mobility Bed Mobility Overal bed mobility: Needs Assistance Bed Mobility: Supine to Sit     Supine to sit: Min guard;HOB elevated     General bed mobility comments: HOB elevated, extra time and effort. heavy use of BUE to off load BLE and hips.   Transfers Overall transfer level: Needs assistance Equipment used: Rolling walker (2 wheeled) Transfers: Sit to/from [facility] Sit to Stand: Min assist;Min guard Stand pivot transfers: Min guard       General transfer comment: min A on intial sit<>stand, min  guard on subsequent trials.     Balance Overall balance assessment: Needs assistance Sitting-balance support: Bilateral upper extremity supported;Single extremity supported;Feet supported Sitting balance-Leahy Scale: Fair     Standing balance support: Bilateral upper extremity supported Standing balance-Leahy Scale: Poor Standing balance comment: BUE support in static stand, rollator at baseline                           ADL either performed or assessed with clinical judgement   ADL Overall ADL's : Needs assistance/impaired Eating/Feeding: Set up;Sitting   Grooming: Set up;Sitting   Upper Body Bathing: Set up;Sitting   Lower Body Bathing: Min guard;Sit to/from stand   Upper Body Dressing : Set up;Sitting   Lower Body Dressing: Min guard;Sit to/from stand   Toilet Transfer: Min guard;Ambulation;RW   Toileting- Water quality scientist and Hygiene: Min guard;Sit to/from stand       Functional mobility during ADLs: Min guard;Rolling walker General ADL Comments: Pt completed bed mobility, 2x sit<>stands, walked pivotal steps to recliner and also walked in place a bit to simulate bathroom distance.      Vision Baseline Vision/History: Wears glasses Wears Glasses: At all times       Perception     Praxis      Pertinent Vitals/Pain Pain Assessment: Faces Faces Pain Scale: Hurts even more Pain Location: L knee in WB position, various aches and pains from arthritis and incisional site Pain Descriptors / Indicators: Grimacing;Guarding Pain Intervention(s): Monitored during session;Limited activity within patient's tolerance;Repositioned     Hand  Dominance     Extremity/Trunk Assessment Upper Extremity Assessment Upper Extremity Assessment: Generalized weakness   Lower Extremity Assessment Lower Extremity Assessment: Defer to PT evaluation       Communication Communication Communication: No difficulties;Other (comment)(some verbal perseveration noted)    Cognition Arousal/Alertness: Awake/alert Behavior During Therapy: Flat affect Overall Cognitive Status: Within Functional Limits for tasks assessed                                 General Comments: tangental responses at times, verbal perseveration noted. slow processing. demonstrates good carry over from [facility] PT/OT session.    General Comments       Exercises     Shoulder Instructions      Home Living Family/patient expects to be discharged to:: Private residence Living Arrangements: Alone Available Help at Discharge: Available PRN/intermittently(CNAs onsight, tiered levels, pt is in ILF) Type of Home: Independent living facility Home Access: [place]: One level     Bathroom Shower/Tub: [place]: Environmental consultant - 4 wheels;Bedside commode;Wheelchair - manual   Additional Comments: Pt resides at [employer] [facility] which she reports is ILF. Pt reports she is independent with basic ADLs and instrumental ADLs (basic meal prep, managing medications). Recently acquired a w/c for community use. Pt reports she has 2 daughters who live in [place], both are occupied full-time. She identies a friend named Educational psychologist who does her grocery shopping and helps with transporation to appointments      Prior Functioning/Environment Level of Independence: Needs assistance  Gait / Transfers Assistance Needed: rollator and extra time/effort ADL's / Homemaking Assistance Needed: rollator and extra time/effort            OT Problem List: Decreased strength;Decreased activity tolerance;Impaired balance (sitting and/or standing);Decreased knowledge of use of DME or AE;Decreased knowledge of precautions;Pain      OT Treatment/Interventions: Self-care/ADL training;Energy conservation;DME and/or AE instruction;Therapeutic activities;Patient/family education;Balance training    OT Goals(Current goals can be found in the care plan section) Acute Rehab OT  Goals Patient Stated Goal: back home, maintain independence, continue working with OT/PT at home OT Goal Formulation: With patient Time For Goal Achievement: 05/25/19 Potential to Achieve Goals: Good ADL Goals Pt Will Perform Grooming: with modified independence;standing Pt Will Perform Lower Body Bathing: with modified independence;sit to/from stand Pt Will Perform Lower Body Dressing: with modified independence;sit to/from stand Pt Will Transfer to Toilet: with modified independence;ambulating Pt Will Perform Toileting - Clothing Manipulation and hygiene: with modified independence;sit to/from stand Pt Will Perform Tub/Shower Transfer: Shower transfer;with modified independence;ambulating;shower seat;rolling walker  OT Frequency: Min 2X/week   Barriers to D/C:            Co-evaluation              AM-PAC OT "6 Clicks" Daily Activity     Outcome Measure Help from another person eating meals?: None Help from another person taking care of personal grooming?: None Help from another person toileting, which includes using toliet, bedpan, or urinal?: A Little Help from another person bathing (including washing, rinsing, drying)?: A Little Help from another person to put on and taking off regular upper body clothing?: None Help from another person to put on and taking off regular lower body clothing?: A Little 6 Click Score: 21   End of Session Equipment Utilized During Treatment: Rolling walker;Gait belt Nurse Communication:  Mobility status  Activity Tolerance: Patient tolerated treatment well Patient left: in chair;with call bell/phone within reach;with chair alarm set  OT Visit Diagnosis: Unsteadiness on feet (R26.81);Other abnormalities of gait and mobility (R26.89);Muscle weakness (generalized) (M62.81);Pain                Time: 9444-6190 OT Time Calculation (min): 25 min Charges:  OT General Charges $OT Visit: 1 Visit OT Evaluation $OT Eval Low Complexity: 1 Low OT  Treatments $Self Care/Home Management : 8-22 mins  Tyrone Schimke, OT Acute Rehabilitation Services Pager: [phone] Office: [phone]   Hortencia Pilar 05/11/2019, 1:19 PM

## 2019-05-11 NOTE — Evaluation (Signed)
Physical Therapy Evaluation Patient Details Name: Stacey Velazquez MRN: [id number] DOB: May 31, 1945 Today's Date: 05/11/2019   History of Present Illness  Pt is 74 yo f s/p TEM PARTIAL PROCTECTOMY OF RECTAL MASS on 05/10/19. PMH includes, but not limited to, autoimmune hepatitis progressing to cirrhosis, paranoid schizophrenia, bipolar, polyarthritis, glaucoma.   Clinical Impression  The patient demonstrates decreased balance and stamina to function independently currently. The patient reports moving very slowly PTA , which she did. Patient does demonstrate decreased balance strategies. Patient is home alone and currently requires assistance. Patient requires steady assist for standing. Patient incontinent of BM also. PT recommends that patient initially have 24/7 caregivers vs SNF, patient not     Follow Up Recommendations Home health PT; vs. SNF, Highly recommend SNF or 24 7 caregivers.   Equipment Recommendations  None recommended by PT    Recommendations for Other Services       Precautions / Restrictions Precautions Precautions: Fall Precaution Comments: vision deficits      Mobility  Bed Mobility Overal bed mobility: Needs Assistance Bed Mobility: Supine to Sit     Supine to sit: Min guard;HOB elevated     General bed mobility comments: in recliner  Transfers Overall transfer level: Needs assistance Equipment used: Rolling walker (2 wheeled) Transfers: Sit to/from Stand Sit to Stand: Min assist Stand pivot transfers: Min guard       General transfer comment: min A on intial sit<>stand, from recliner, pushes from arms, slow to rise.  Ambulation/Gait Ambulation/Gait assistance: Min assist;Mod assist Gait Distance (Feet): 20 Feet Assistive device: Rolling walker (2 wheeled) Gait Pattern/deviations: Step-to pattern;Decreased step length - left Gait velocity: decr Gait velocity interpretation: <1.8 ft/sec, indicate of risk for recurrent falls General Gait Details:  gait is very slow, step to , patient onl;y tolerated 20' and requested to sit down.  Stairs            Wheelchair Mobility    Modified Rankin (Stroke Patients Only)       Balance Overall balance assessment: Needs assistance Sitting-balance support: Bilateral upper extremity supported;Single extremity supported;Feet supported Sitting balance-Leahy Scale: Fair     Standing balance support: Bilateral upper extremity supported Standing balance-Leahy Scale: Poor Standing balance comment: BUE support in static stand, rollator at baseline                             Pertinent Vitals/Pain Pain Assessment: Faces Faces Pain Scale: Hurts even more Pain Location: L knee in WB position, various aches and pains from arthritis and incisional site Pain Descriptors / Indicators: Grimacing;Guarding Pain Intervention(s): Monitored during session;Premedicated before session    Home Living Family/patient expects to be discharged to:: Private residence Living Arrangements: Alone Available Help at Discharge: Available PRN/intermittently(CNAs onsight, tiered levels, pt is in ILF) Type of Home: Independent living facility Home Access: Elevator     Home Layout: One level Home Equipment: Environmental consultant - 4 wheels;Bedside commode;Wheelchair - manual Additional Comments: Pt resides at [employer] [facility] which she reports is ILF. Pt reports she is independent with basic ADLs and instrumental ADLs (basic meal prep, managing medications). Recently acquired a w/c for community use. Pt reports she has 2 daughters who live in [place], both are occupied full-time. She identies a friend named [place] who does her grocery shopping and helps with transporation to appointments    Prior Function Level of Independence: Needs assistance   Gait / Transfers Assistance Needed: rollator and extra time/effort  ADL's / Homemaking Assistance Needed: rollator and extra time/effort, sponge bathes sitting        Hand  Dominance        Extremity/Trunk Assessment   Upper Extremity Assessment Upper Extremity Assessment: Generalized weakness    Lower Extremity Assessment Lower Extremity Assessment: RLE deficits/detail;LLE deficits/detail RLE Deficits / Details: knee flexion 100 LLE Deficits / Details: knee flexes to 90, audible crepitus       Communication   Communication: No difficulties;Other (comment)(some verbal perseveration noted)  Cognition Arousal/Alertness: Awake/alert Behavior During Therapy: Flat affect Overall Cognitive Status: Within Functional Limits for tasks assessed                                 General Comments: tangental responses at times, verbal perseveration noted. slow processing.      General Comments      Exercises     Assessment/Plan    PT Assessment Patient needs continued PT services  PT Problem List Decreased strength;Decreased balance;Decreased knowledge of precautions;Decreased mobility;Decreased range of motion;Decreased knowledge of use of DME;Decreased activity tolerance       PT Treatment Interventions DME instruction;Functional mobility training;Patient/family education;Gait training;Therapeutic activities;Therapeutic exercise    PT Goals (Current goals can be found in the Care Plan section)  Acute Rehab PT Goals Patient Stated Goal: back home, maintain independence, continue working with OT/PT at home PT Goal Formulation: With patient Time For Goal Achievement: 05/25/19 Potential to Achieve Goals: Good    Frequency Min 3X/week   Barriers to discharge Decreased caregiver support      Co-evaluation               AM-PAC PT "6 Clicks" Mobility  Outcome Measure Help needed turning from your back to your side while in a flat bed without using bedrails?: A Little Help needed moving from lying on your back to sitting on the side of a flat bed without using bedrails?: A Little Help needed moving to and from a bed to a chair  (including a wheelchair)?: A Little Help needed standing up from a chair using your arms (e.g., wheelchair or bedside chair)?: A Little Help needed to walk in hospital room?: A Lot Help needed climbing 3-5 steps with a railing? : A Lot 6 Click Score: 16    End of Session Equipment Utilized During Treatment: Gait belt Activity Tolerance: Patient tolerated treatment well Patient left: in chair;with chair alarm set;with call bell/phone within reach Nurse Communication: Mobility status PT Visit Diagnosis: Unsteadiness on feet (R26.81);Difficulty in walking, not elsewhere classified (R26.2)    Time: 6270-3500 PT Time Calculation (min) (ACUTE ONLY): 34 min   Charges:   PT Evaluation $PT Eval Low Complexity: 1 Low PT Treatments $Gait Training: 8-22 mins        Tresa Endo PT Acute Rehabilitation Services Pager [phone] Office [phone]   Stacey Velazquez 05/11/2019, 2:15 PM

## 2019-05-11 NOTE — Progress Notes (Signed)
Notified by patient RN that after PT worked with patient they were recommending SNF or 24/7 caregiver upon discharge. I came up and discussed recommendations with patient who refuses SNF placement at this time. She does not have 24/7 support in place but does have intermittent supervision at home with a friend who helps her around her apartment and helps with shopping. She also currently has home health PT/OT/RN in place. At this time I think it is appropriate to cancel discharge for today and allow patient to work with PT/OT more to try to progress to needing only intermittent supervision. This plan discussed with patient and with attending provider who are in agreement that this is an appropriate plan. If patient is not able to progress to intermittent supervision in the next 2-3 days would revisit conversation regarding SNF placement.   Norm Parcel , [facility] Surgery 05/11/2019, 2:46 PM Please see Amion for pager number during day hours 7:00am-4:30pm

## 2019-05-12 NOTE — Progress Notes (Signed)
Physical Therapy Treatment Patient Details Name: Stacey Velazquez MRN: [id number] DOB: Aug 22, 1945 Today's Date: 05/12/2019    History of Present Illness Pt is 74 yo f s/p TEM PARTIAL PROCTECTOMY OF RECTAL MASS on 05/10/19. PMH includes, but not limited to, autoimmune hepatitis progressing to cirrhosis, paranoid schizophrenia, bipolar, polyarthritis, glaucoma.     PT Comments    Pt OOB in recliner.  Assisted with amb.  General transfer comment: required increased time and much effort to stand but able by self.  Strong B UE's.  Limited LE.  Good safety cognition and awareness of self abilities. General Gait Details: used a Rollator this session as pt uses at home.  Short steps with excessive WBing thru walker esp during L LE stance phase.  "my left knee is bad", stated pt.  "Has been for years".  Also required increased time.  "I walk slow", stated pt.  "Sometimes it takes me 10 min to walk from my bedroom to the kitchen".  Pt lives in an [place] appartment. Pt plans to return to her Indep Apart.  Has all equipment.    Has friend [name] who takes her to appts, does her grocery shopping and performs housekeeping (laundry/cleaning)   Pt stated she herself can manage ADL's and microwave meals.    Follow Up Recommendations  Home health PT;SNF;Supervision/Assistance - 24 hour     Equipment Recommendations  None recommended by PT(has all equipment)    Recommendations for Other Services       Precautions / Restrictions Precautions Precautions: Fall Precaution Comments: low vision, "bad" L knee Restrictions Weight Bearing Restrictions: No    Mobility  Bed Mobility               General bed mobility comments: OOB in recliner  Transfers Overall transfer level: Needs assistance Equipment used: 4-wheeled walker Transfers: Sit to/from [employer] Transfers Sit to Stand: Supervision;Min guard Stand pivot transfers: Supervision;Min guard       General transfer comment:  required increased time and much effort to stand but able by self.  Strong B UE's.  Limited LE.  Good safety cognition and awareness of self abilities.  Ambulation/Gait Ambulation/Gait assistance: Supervision;Min guard Gait Distance (Feet): 18 Feet Assistive device: 4-wheeled walker Gait Pattern/deviations: Step-to pattern;Decreased step length - left Gait velocity: decreased x 3   General Gait Details: used a Rollator this session as pt uses at home.  Short steps with excessive WBing thru walker esp during L LE stance phase.  "my left knee is bad", stated pt.  "Has been for years".  Also required increased time.  "I walk slow", stated pt.  "Sometimes it takes me 10 min to walk from my bedroom to the kitchen".  Pt lives in an [place] appartment.   Stairs             Wheelchair Mobility    Modified Rankin (Stroke Patients Only)       Balance                                            Cognition Arousal/Alertness: Awake/alert Behavior During Therapy: WFL for tasks assessed/performed Overall Cognitive Status: Within Functional Limits for tasks assessed                                 General  Comments: appears at prior level and answered all questions appropriately      Exercises      General Comments        Pertinent Vitals/Pain Pain Assessment: No/denies pain Pain Location: L knee in WB position, various aches and pains from arthritis and incisional site Pain Descriptors / Indicators: Grimacing;Guarding Pain Intervention(s): Monitored during session;Repositioned    Home Living                      Prior Function            PT Goals (current goals can now be found in the care plan section) Progress towards PT goals: Progressing toward goals    Frequency    Min 3X/week      PT Plan Current plan remains appropriate    Co-evaluation              AM-PAC PT "6 Clicks" Mobility   Outcome Measure   Help needed turning from your back to your side while in a flat bed without using bedrails?: None Help needed moving from lying on your back to sitting on the side of a flat bed without using bedrails?: None Help needed moving to and from a bed to a chair (including a wheelchair)?: None Help needed standing up from a chair using your arms (e.g., wheelchair or bedside chair)?: None Help needed to walk in hospital room?: A Little Help needed climbing 3-5 steps with a railing? : A Lot 6 Click Score: 21    End of Session Equipment Utilized During Treatment: Gait belt Activity Tolerance: Patient tolerated treatment well Patient left: in chair;with chair alarm set;with call bell/phone within reach Nurse Communication: Mobility status PT Visit Diagnosis: Unsteadiness on feet (R26.81);Difficulty in walking, not elsewhere classified (R26.2)     Time: 8938-1017 PT Time Calculation (min) (ACUTE ONLY): 25 min  Charges:  $Gait Training: 8-22 mins $Therapeutic Activity: 8-22 mins                     Rica Koyanagi  PTA Acute  Rehabilitation Services Pager      [phone] Office      [phone]

## 2019-05-12 NOTE — Progress Notes (Signed)
Stacey Velazquez [id number] July 14, 1945  CARE TEAM:  PCP: Rocco Serene, MD  Outpatient Care Team: Patient Care Team: Rocco Serene, MD as PCP - General (Internal Medicine) [name], MD as PCP - Electrophysiology (Cardiology) [name], MD as Consulting Physician (Cardiology) Michael Boston, MD as Consulting Physician (General Surgery)  Inpatient Treatment Team: Treatment Team: Attending Provider: Michael Boston, MD; Utilization Review: Orlean Bradford, RN; Registered Nurse: Elza Rafter, RN; Physical Therapy Assistant: Diego Cory PTA; Social Worker: Lia Hopping, LCSW   Problem List:   Active Problems:   Adenomatous rectal polyp   Adenomatous polyp of rectum   2 Days Post-Op  05/10/2019  Procedure(s): TEM PARTIAL PROCTECTOMY OF RECTAL MASS WITH EXCISION RIGHT PERINEAL SKIN MASS    Assessment  Bleeding rectal polyp- TEM partial proctectomy POD#2 Cirrhosis  [place] Stay = 2 days)  Plan:  -Advance diet as tolerated -PT/OT recommending SNF vs 24h observation.  Pt refused SNF placement and does not have anyone that can stay with her at home -VTE prophylaxis- SCDs, etc -mobilize as tolerated to help recovery  Cont PT/OT over the weekend  05/12/2019    Subjective: Bleeding rectal polyp   Patient having BM's. Has been able to ambulate to the bathroom with assistance.   Objective:  Vital signs:  Vitals:   05/11/19 2114 05/12/19 0139 05/12/19 0522 05/12/19 0726  BP: 121/65 114/68 125/74 112/74  Pulse: 76 69 66 66  Resp: 20 19 20 18   Temp: 97.8 F (36.6 C) 97.7 F (36.5 C) 98.2 F (36.8 C) 97.8 F (36.6 C)  TempSrc: Oral Oral Oral Oral  SpO2: 97% 97% 95% 98%  Weight:   108.3 kg   Height:        Last BM Date: 05/10/19  Intake/Output   Yesterday:  04/16 0701 - 04/17 0700 In: 750 [P.O.:750] Out: 700 [Urine:700] This shift:  Total I/O In: -  Out: 350 [Urine:350]  Bowel function:  Flatus: YES  BM:   YES  Drain: (No drain)   Physical Exam:  General: Pt awake/alert in no acute distress  MS: Normal AROM mjr joints.  No obvious deformity  Abdomen: Soft.  Nondistended.  Nontender.  No evidence of peritonitis.  No incarcerated hernias.  Ext:   No deformity.  No mjr edema.  No cyanosis Skin: No petechiae / purpurea.  No major sores.  Warm and dry    Results:   Cultures: Recent Results (from the past 720 hour(s))  SARS CORONAVIRUS 2 (TAT 6-24 HRS) Nasopharyngeal Nasopharyngeal Swab     Status: None   Collection Time: 05/07/19 10:43 AM   Specimen: Nasopharyngeal Swab  Result Value Ref Range Status   SARS Coronavirus 2 NEGATIVE NEGATIVE Final    Comment: (NOTE) SARS-CoV-2 target nucleic acids are NOT DETECTED. The SARS-CoV-2 RNA is generally detectable in upper and lower respiratory specimens during the acute phase of infection. Negative results do not preclude SARS-CoV-2 infection, do not rule out co-infections with other pathogens, and should not be used as the sole basis for treatment or other patient management decisions. Negative results must be combined with clinical observations, patient history, and epidemiological information. The expected result is Negative. Fact Sheet for Patients: SugarRoll.be Fact Sheet for Healthcare Providers: https://www.woods-mathews.com/ This test is not yet approved or cleared by the [place] FDA and  has been authorized for detection and/or diagnosis of SARS-CoV-2 by FDA under an Emergency Use Authorization (EUA). This EUA will remain  in effect (  meaning this test can be used) for the duration of the COVID-19 declaration under Section 56 4(b)(1) of the Act, 21 U.S.C. section 360bbb-3(b)(1), unless the authorization is terminated or revoked sooner. Performed at [facility], [place] [address] [place], [place] [address]     Labs: Results for orders placed or performed during the  hospital encounter of 05/10/19 (from the past 48 hour(s))  Basic metabolic panel     Status: Abnormal   Collection Time: 05/11/19  4:46 AM  Result Value Ref Range   Sodium 133 (L) 135 - 145 mmol/L   Potassium 3.4 (L) 3.5 - 5.1 mmol/L   Chloride 107 98 - 111 mmol/L   CO2 21 (L) 22 - 32 mmol/L   Glucose, Bld 137 (H) 70 - 99 mg/dL    Comment: Glucose reference range applies only to samples taken after fasting for at least 8 hours.   BUN 14 8 - 23 mg/dL   Creatinine, Ser 0.66 0.44 - 1.00 mg/dL   Calcium 8.0 (L) 8.9 - 10.3 mg/dL   GFR calc non Af Amer >60 >60 mL/min   GFR calc Af Amer >60 >60 mL/min   Anion gap 5 5 - 15    Comment: Performed at [facility], [place] [address] [place], [place] [address]  CBC     Status: Abnormal   Collection Time: 05/11/19  4:46 AM  Result Value Ref Range   WBC 4.0 4.0 - 10.5 K/uL   RBC 3.71 (L) 3.87 - 5.11 MIL/uL   Hemoglobin 11.8 (L) 12.0 - 15.0 g/dL   HCT 35.9 (L) 36.0 - 46.0 %   MCV 96.8 80.0 - 100.0 fL   MCH 31.8 26.0 - 34.0 pg   MCHC 32.9 30.0 - 36.0 g/dL   RDW 17.4 (H) 11.5 - 15.5 %   Platelets 74 (L) 150 - 400 K/uL    Comment: CONSISTENT WITH PREVIOUS RESULT Immature Platelet Fraction may be clinically indicated, consider ordering this additional test [id number]    nRBC 0.0 0.0 - 0.2 %    Comment: Performed at [facility], [place] [address], [place], [place] [address]  Magnesium     Status: None   Collection Time: 05/11/19  4:46 AM  Result Value Ref Range   Magnesium 1.7 1.7 - 2.4 mg/dL    Comment: Performed at [facility], [place] [address] [place], [place] [address]    Imaging / Studies: No results found.  Medications / Allergies: per chart  Antibiotics: Anti-infectives (From admission, onward)   Start     Dose/Rate Route Frequency Ordered Stop   05/10/19 2200  clindamycin (CLEOCIN) IVPB 900 mg     900 mg 100 mL/hr over 30 Minutes Intravenous Every 8 hours 05/10/19 1709  05/11/19 1636   05/10/19 1400  neomycin (MYCIFRADIN) tablet 1,000 mg  Status:  Discontinued     1,000 mg Oral 3 times per day 05/10/19 1118 05/10/19 1708   05/10/19 1400  metroNIDAZOLE (FLAGYL) tablet 1,000 mg  Status:  Discontinued     1,000 mg Oral 3 times per day 05/10/19 1118 05/10/19 1708   05/10/19 1130  clindamycin (CLEOCIN) IVPB 900 mg     900 mg 100 mL/hr over 30 Minutes Intravenous On call to O.R. 05/10/19 1117 05/10/19 1414   05/10/19 0600  clindamycin (CLEOCIN) 900 mg, gentamicin (GARAMYCIN) 240 mg in sodium chloride 0.9 % 1,000 mL for intraperitoneal lavage  Status:  Discontinued      Irrigation  On call to O.R. 05/09/19 0802 05/10/19 1708   05/10/19 0600  gentamicin (GARAMYCIN) 390 mg in dextrose 5 % 100 mL IVPB     5 mg/kg  78.8 kg (Adjusted) 109.8 mL/hr over 60 Minutes Intravenous On call to O.R. 05/09/19 0804 61/47/09 2957         Salaam Battershell C Colten Desroches, MD  Colorectal and [employer] [facility]      [address] [place], [place] [address] [phone] Main / Paging [phone] Fax Please see Amion for pager number, especial 5pm - 7am.

## 2019-05-13 NOTE — Progress Notes (Signed)
Stacey Velazquez [id number] November 16, 1945  CARE TEAM:  PCP: Stacey Serene, MD  Outpatient Care Team: Patient Care Team: Stacey Serene, MD as PCP - General (Internal Medicine) Stacey Sprang, MD as PCP - Electrophysiology (Cardiology) Stacey Sprang, MD as Consulting Physician (Cardiology) Stacey Boston, MD as Consulting Physician (General Surgery)  Inpatient Treatment Team: Treatment Team: Attending Provider: Michael Boston, MD; Utilization Review: Stacey Bradford, RN; Registered Nurse: Stacey Schlein, RN; Technician: Stacey Velazquez, NT   Problem List:   Active Problems:   Adenomatous rectal polyp   Adenomatous polyp of rectum   3 Days Post-Op  05/10/2019  Procedure(s): TEM PARTIAL PROCTECTOMY OF RECTAL MASS WITH EXCISION RIGHT PERINEAL SKIN MASS    Assessment  Bleeding rectal polyp- TEM partial proctectomy POD#3 Cirrhosis  [facility] Stay = 3 days)  Plan:  -Advance diet as tolerated -PT/OT recommending SNF vs 24h observation.  Pt refused SNF placement and does not have anyone that can stay with her at home -VTE prophylaxis- SCDs, etc -mobilize as tolerated to help recovery  Cont PT/OT over the weekend  05/13/2019    Subjective: Bleeding rectal polyp   Patient having BM's. Has been able to ambulate to the bathroom with assistance.   Objective:  Vital signs:  Vitals:   05/12/19 0726 05/12/19 1308 05/12/19 2101 05/13/19 0523  BP: 112/74 119/70 128/74 112/67  Pulse: 66 75 77 72  Resp: 18 18 17 16   Temp: 97.8 F (36.6 C) 98.1 F (36.7 C) 98.3 F (36.8 C) 97.9 F (36.6 C)  TempSrc: Oral Oral Oral   SpO2: 98% 98% 97% 98%  Weight:      Height:        Last BM Date: 05/12/19  Intake/Output   Yesterday:  04/17 0701 - 04/18 0700 In: 6073 [P.O.:1690] Out: 850 [Urine:850] This shift:  No intake/output data recorded.  Bowel function:  Flatus: YES  BM:  YES  Drain: (No drain)   Physical Exam:  General: Pt awake/alert in no  acute distress  MS: Normal AROM mjr joints.  No obvious deformity  Abdomen: Soft.  Nondistended.  Nontender.  No evidence of peritonitis.  No incarcerated hernias.  Ext:   No deformity.  No mjr edema.  No cyanosis Skin: No petechiae / purpurea.  No major sores.  Warm and dry    Results:   Cultures: Recent Results (from the past 720 hour(s))  SARS CORONAVIRUS 2 (TAT 6-24 HRS) Nasopharyngeal Nasopharyngeal Swab     Status: None   Collection Time: 05/07/19 10:43 AM   Specimen: Nasopharyngeal Swab  Result Value Ref Range Status   SARS Coronavirus 2 NEGATIVE NEGATIVE Final    Comment: (NOTE) SARS-CoV-2 target nucleic acids are NOT DETECTED. The SARS-CoV-2 RNA is generally detectable in upper and lower respiratory specimens during the acute phase of infection. Negative results do not preclude SARS-CoV-2 infection, do not rule out co-infections with other pathogens, and should not be used as the sole basis for treatment or other patient management decisions. Negative results must be combined with clinical observations, patient history, and epidemiological information. The expected result is Negative. Fact Sheet for Patients: SugarRoll.be Fact Sheet for Healthcare Providers: https://www.woods-mathews.com/ This test is not yet approved or cleared by the [place] FDA and  has been authorized for detection and/or diagnosis of SARS-CoV-2 by FDA under an Emergency Use Authorization (EUA). This EUA will remain  in effect (meaning this test can be used) for the duration of the COVID-19 declaration under  Section 56 4(b)(1) of the Act, 21 U.S.C. section 360bbb-3(b)(1), unless the authorization is terminated or revoked sooner. Performed at [facility], [place] [address], [place], [place] [address]     Labs: No results found for this or any previous visit (from the past 48 hour(s)).  Imaging / Studies: No results found.  Medications /  Allergies: per chart  Antibiotics: Anti-infectives (From admission, onward)   Start     Dose/Rate Route Frequency Ordered Stop   05/10/19 2200  clindamycin (CLEOCIN) IVPB 900 mg     900 mg 100 mL/hr over 30 Minutes Intravenous Every 8 hours 05/10/19 1709 05/11/19 1636   05/10/19 1400  neomycin (MYCIFRADIN) tablet 1,000 mg  Status:  Discontinued     1,000 mg Oral 3 times per day 05/10/19 1118 05/10/19 1708   05/10/19 1400  metroNIDAZOLE (FLAGYL) tablet 1,000 mg  Status:  Discontinued     1,000 mg Oral 3 times per day 05/10/19 1118 05/10/19 1708   05/10/19 1130  clindamycin (CLEOCIN) IVPB 900 mg     900 mg 100 mL/hr over 30 Minutes Intravenous On call to O.R. 05/10/19 1117 05/10/19 1414   05/10/19 0600  clindamycin (CLEOCIN) 900 mg, gentamicin (GARAMYCIN) 240 mg in sodium chloride 0.9 % 1,000 mL for intraperitoneal lavage  Status:  Discontinued      Irrigation On call to O.R. 05/09/19 0802 05/10/19 1708   05/10/19 0600  gentamicin (GARAMYCIN) 390 mg in dextrose 5 % 100 mL IVPB     5 mg/kg  78.8 kg (Adjusted) 109.8 mL/hr over 60 Minutes Intravenous On call to O.R. 05/09/19 0804 96/75/91 6384         Stacey Sofia C Beverlee Wilmarth, MD  [facility]      [address], [place], [place] [address] [phone] Main / Paging [phone] Fax Please see Amion for pager number, especial 5pm - 7am.

## 2019-05-14 ENCOUNTER — Telehealth: Payer: Self-pay | Admitting: Gastroenterology

## 2019-05-14 DIAGNOSIS — Z7409 Other reduced mobility: Secondary | ICD-10-CM

## 2019-05-14 LAB — SURGICAL PATHOLOGY

## 2019-05-14 NOTE — Progress Notes (Signed)
Occupational Therapy Treatment Patient Details Name: Stacey Velazquez MRN: [id number] DOB: 11-27-1945 Today's Date: 05/14/2019    History of present illness Pt is 74 yo f s/p TEM PARTIAL PROCTECTOMY OF RECTAL MASS on 05/10/19. PMH includes, but not limited to, autoimmune hepatitis progressing to cirrhosis, paranoid schizophrenia, bipolar, polyarthritis, glaucoma.    OT comments  Pt feels she isback to baseline and ready to DC home  Follow Up Recommendations  Home health OT;Supervision - Intermittent    Equipment Recommendations  None recommended by OT    Recommendations for Other Services PT consult    Precautions / Restrictions Precautions Precautions: Fall Precaution Comments: low vision, "bad" L knee Restrictions Weight Bearing Restrictions: No       Mobility Bed Mobility               General bed mobility comments: OOB in recliner  Transfers Overall transfer level: Modified independent   Transfers: Sit to/from [employer] Transfers Sit to Stand: Supervision Stand pivot transfers: Supervision                ADL either performed or assessed with clinical judgement   ADL Overall ADL's : At baseline                                       General ADL Comments: pt feels she is at baseline and ready to go home. Pt overall S with all ADL activity with OT this session               Cognition Arousal/Alertness: Awake/alert Behavior During Therapy: WFL for tasks assessed/performed Overall Cognitive Status: Within Functional Limits for tasks assessed                                 General Comments: appears at prior level and answered all questions appropriately                   Pertinent Vitals/ Pain       Pain Assessment: No/denies pain         Frequency  Min 2X/week        Progress Toward Goals  OT Goals(current goals can now be found in the care plan section)  Progress towards OT goals:  Progressing toward goals     Plan Discharge plan remains appropriate       AM-PAC OT "6 Clicks" Daily Activity     Outcome Measure   Help from another person eating meals?: None Help from another person taking care of personal grooming?: None Help from another person toileting, which includes using toliet, bedpan, or urinal?: A Little Help from another person bathing (including washing, rinsing, drying)?: A Little Help from another person to put on and taking off regular upper body clothing?: None Help from another person to put on and taking off regular lower body clothing?: A Little 6 Click Score: 21    End of Session Equipment Utilized During Treatment: Rolling walker;Gait belt  OT Visit Diagnosis: Unsteadiness on feet (R26.81);Other abnormalities of gait and mobility (R26.89);Muscle weakness (generalized) (M62.81);Pain   Activity Tolerance Patient tolerated treatment well   Patient Left in chair;with call bell/phone within reach;with chair alarm set   Nurse Communication Mobility status        Time: 2426-8341 OT Time Calculation (min): 15 min  Charges: OT General  Charges $OT Visit: 1 Visit OT Treatments $Self Care/Home Management : 8-22 mins  Kari Baars, [place] [phone] Office- [phone], Edwena Felty D 05/14/2019, 3:42 PM

## 2019-05-14 NOTE — TOC Initial Note (Signed)
Transition of Care [facility]) - Initial/Assessment Note    Patient Details  Name: Stacey Velazquez MRN: [id number] Date of Birth: [birth year]  Transition of Care [facility]) CM/SW Contact:    Stacey Velazquez, [place] Phone Number: 05/14/2019, 12:41 PM  Clinical Narrative:                 CSW met with the patient at bedside to discuss discharge plan home, CSW discuss [place] home first program for additional home support.  Patient active with [facility] and prefers to continue current PT/OT services with the agency. Patient reports, "they [facility] staff) are good to me, they treat me so nicely and check on me everyday just to make sure nothing has changed." Patient reports she lives in small apartment with "very supportive neighbors on the hall." Patient reports her friend Stacey Velazquez assist with groceries, cleaning her apartment and takes her to appointments.  Patient has a medical alert button right next to her bed and she keeps the cordless phone with her at all time. She uses a Corporate investment banker with at basket primarily only walking short distances and has a bedside commode right next to her bed.  She reports that she can call Radio broadcast assistant of the property at anytime for help if needed. Patient feels safe to return home.  CSW reached out [place] with [facility] to confirm resumption of care. Patient will be seen within 48 hours of returning home.   She reports her friend Stacey Velazquez will transport her home today.  No other needs identified.   Expected Discharge Plan: [place] Barriers to Discharge: Barriers Resolved   Patient Goals and CMS Choice Patient states their goals for this hospitalization and ongoing recovery are:: "I don't want to go to SNF, I want to go back to my apartment." CMS Medicare.gov Compare Post Acute Care list provided to:: Patient Choice offered to / list presented to : (Active with [facility])  Expected Discharge Plan and Services Expected Discharge Plan: [place] In-house Referral: NA Discharge Planning Services: NA Post Acute Care Choice: [place] arrangements for the past 2 months: Apartment Expected Discharge Date: 05/14/19                         HH Arranged: PT, OT, RN [place] Agency: [place] Date [place]: 05/14/19 Time [place]: 1039 Representative spoke with at [place]: [name]  Prior Living Arrangements/Services Living arrangements for the past 2 months: [place] with:: Self Patient language and need for interpreter reviewed:: No Do you feel safe going back to the place where you live?: Yes      Need for Family Participation in Patient Care: Yes (Comment) Care giver support system in place?: No (comment) Current home services: Home OT, Home PT Criminal Activity/Legal Involvement Pertinent to Current Situation/Hospitalization: No - Comment as needed  Activities of Daily Living Home Assistive Devices/Equipment: Environmental consultant (specify type), Eyeglasses, Dentures (specify type)(upper dentures) ADL Screening (condition at time of admission) Patient's cognitive ability adequate to safely complete daily activities?: Yes Is the patient deaf or have difficulty hearing?: No Does the patient have difficulty seeing, even when wearing glasses/contacts?: No Does the patient have difficulty concentrating, remembering, or making decisions?: No Patient able to express need for assistance with ADLs?: Yes Does the patient have difficulty dressing or bathing?: No Independently performs ADLs?: Yes (appropriate for developmental age) Does the patient have difficulty  walking or climbing stairs?: Yes Weakness of Legs: Both Weakness of Arms/Hands: Both  Permission Sought/Granted Permission sought to share information with : Other (comment)(Home Health agency) Permission granted to share information with : Yes, Verbal Permission Granted  Share Information with NAME: Stacey Velazquez  Permission granted to share info w AGENCY: [place] granted to share info w Relationship: Friend  Permission granted to share info w Contact Information: [phone]  [phone]  Emotional Assessment Appearance:: Appears stated age Attitude/Demeanor/Rapport: Engaged Affect (typically observed): Pleasant, Hopeful Orientation: : Oriented to Self, Oriented to Place, Oriented to  Time, Oriented to Situation Alcohol / Substance Use: Not Applicable Psych Involvement: No (comment)  Admission diagnosis:  Adenomatous rectal polyp [D12.8] Adenomatous polyp of rectum [D12.8] Patient Active Problem List   Diagnosis Date Noted  . Limited mobility 05/14/2019  . Adenomatous polyp of rectum s/p TEM partial proctectomy 05/09/2019 05/10/2019  . Hepatic cirrhosis ([place]) 04/09/2019  . Obesity, Class II, BMI 35-39.9 04/09/2019  . Hallucinations 03/13/2019  . Rectal bleeding 03/05/2019  . Vertigo   . Heart disease   . GERD (gastroesophageal reflux disease)   . Autoimmune hepatitis ([place])   . Anxiety   . Low back pain 10/18/2016  . Acute right hip pain 08/26/2016  . Seizure disorder ([place]) 10/14/2014  . Drug-induced Parkinson's disease ([place]) 10/14/2014  . Hypothyroidism 09/09/2014  . Azathioprine-induced leukopenia ([place]) 08/25/2014  . Thrombocytopenia due to drugs 08/25/2014  . Syncope   . Sinus arrest   . Pacemaker [employer]tronic   . Bipolar 1 disorder ([place])   . Paranoid schizophrenia ([place]) 03/25/2012  . Glaucoma 01/25/2001   PCP:  Rocco Serene, MD Pharmacy:   [place], [place] - [address] [place] [address] Phone: [phone] Fax: [phone]     Social Determinants of Health (SDOH) Interventions    Readmission Risk Interventions No flowsheet data found.

## 2019-05-14 NOTE — Telephone Encounter (Signed)
Pt was due for CBC but just had one done on 05/11/19 in the hospital. Pt wanted to let us know she is home and does not feel well enough to come for labs. Let her know Dr. Tarri Glenn would be notified and she should be ok since just done last week.

## 2019-05-14 NOTE — Telephone Encounter (Signed)
Recent labs are reviewed.  No new recommendations at this time.  Thank you.

## 2019-05-14 NOTE — Progress Notes (Signed)
D/C instructions given to patient. Patient had no questions. NT or writer will wheel patient out once family comes to pick her up

## 2019-05-14 NOTE — Care Management Important Message (Signed)
Important Message  Patient Details IM Letter given to [place] Case Manager to present to the Patient Name: Stacey Velazquez MRN: [id number] Date of Birth: 1945/09/17   Medicare Important Message Given:  Yes     Kerin Salen 05/14/2019, 11:03 AM

## 2019-05-14 NOTE — Discharge Summary (Signed)
Physician Discharge Summary    Patient ID: Stacey Velazquez MRN: [id number] DOB/AGE: 05-19-1945  74 y.o.  Patient Care Team: Rocco Serene, MD as PCP - General (Internal Medicine) [name], MD as PCP - Electrophysiology (Cardiology) [name], MD as Consulting Physician (Cardiology) Michael Boston, MD as Consulting Physician (General Surgery)  Admit date: 05/10/2019  Discharge date: 05/14/2019  Hospital Stay = 4 days    Discharge Diagnoses:  Principal Problem:   Adenomatous polyp of rectum s/p TEM partial proctectomy 05/09/2019 Active Problems:   Autoimmune hepatitis ([place])   Thrombocytopenia due to drugs   Hepatic cirrhosis (HCC)   Bipolar 1 disorder (HCC)   Pacemaker Medtronic   Azathioprine-induced leukopenia (HCC)   Hypothyroidism   Seizure disorder (HCC)   Heart disease   Glaucoma   GERD (gastroesophageal reflux disease)   Anxiety   Paranoid schizophrenia ([place])   Obesity, Class II, BMI 35-39.9   Limited mobility   4 Days Post-Op  05/10/2019  POST-OPERATIVE DIAGNOSIS:  BLEEDING RECTAL POLYPWITH HIGH GRADE DYSPLASIA Grade 4 right posterior and left posterior hemorrhoidS RIGHT PERINEAL SKIN MASS  PROCEDURE:  TEM PARTIAL PROCTECTOMY OF RECTAL MASS EXCISION OF PERINEAL SKIN MASS Hemorrhoidal ligation x2  SURGEON: Adin Hector, MD  Consults: PT/OT/Case Management  Hospital Course:   The patient underwent  the surgery above.  Postoperatively, the patient gradually mobilized and advanced to a solid diet.  Pain and other symptoms were treated aggressively.    By the time of discharge, the patient was eating food, having flatus & BMs.  Pain was well-controlled on an oral medications.  The patient returned to her usual mental and physical baseline.  While she still required assistance for mobility, this was her baseline at home for which she claimed to have good community support.  She underwent physical and Occupational Therapy evaluations.  It  was felt she would benefit from being in a skilled facility, but the patient declined.  Therefore we work to try and help bolster home health care through physical and Occupational Therapy evaluations.  Based on meeting discharge criteria and continuing to recover, I felt it was safe for the patient to be discharged from the hospital to further recover with close followup. Postoperative recommendations were discussed in detail.  They are written as well.  Discharged Condition: Fair but her usual baseline  Discharge Exam: Blood pressure 115/63, pulse 72, temperature 98 F (36.7 C), temperature source Oral, resp. rate 18, height 5\' 9"  (1.753 m), weight 109.2 kg, SpO2 94 %.  General: Pt awake/alert in no acute distress Eyes: PERRL, normal EOM.  Sclera clear.  No icterus Neuro: CN II-XII intact w/o focal sensory/motor deficits. Lymph: No head/neck/groin lymphadenopathy Psych:  No delerium/psychosis/paranoia.  Oriented x 4 HENT: Normocephalic, Mucus membranes moist.  No thrush Neck: Supple, No tracheal deviation.  No obvious thyromegaly Chest: No pain to chest wall compression.  Good respiratory excursion.  No audible wheezing CV:  Pulses intact.  Regular rhythm.  No major extremity edema MS: Normal AROM mjr joints.  No obvious deformity  Abdomen: Soft.  Nondistended.  Nontender.  No evidence of peritonitis.  No incarcerated hernias.  GU: Urine wick in place. Rectal: Deferred given recent surgery and asymptomatic. Ext:  No deformity.  No mjr edema.  No cyanosis Skin: No petechiae / purpurea.  No major sores.  Warm and dry   Disposition:   Follow-up Information    Michael Boston, MD Follow up in 3 week(s).   Specialty: General  Surgery Contact information: [address] [place] [address] [phone]           Discharge disposition: 01-Home or Self Care       Discharge Instructions    Call MD for:  hives   Complete by: As directed    Call MD for:  hives    Complete by: As directed    Call MD for:  persistant dizziness or light-headedness   Complete by: As directed    Call MD for:  persistant dizziness or light-headedness   Complete by: As directed    Call MD for:  persistant nausea and vomiting   Complete by: As directed    Call MD for:  persistant nausea and vomiting   Complete by: As directed    Call MD for:  redness, tenderness, or signs of infection (pain, swelling, redness, odor or green/yellow discharge around incision site)   Complete by: As directed    You will often notice bleeding with bowel movements.  Expect some yellow or tan drainage.  This can occur for weeks, but should be mild by the end of the first week of surgery.  Wear an absorbent pad or soft cotton gauze in your underwear until the drainage stops   Call MD for:  redness, tenderness, or signs of infection (pain, swelling, redness, odor or green/yellow discharge around incision site)   Complete by: As directed    You will often notice bleeding with bowel movements.  Expect some yellow or tan drainage.  This can occur for weeks, but should be mild by the end of the first week of surgery.  Wear an absorbent pad or soft cotton gauze in your underwear until the drainage stops   Call MD for:  severe uncontrolled pain   Complete by: As directed    Call MD for:  severe uncontrolled pain   Complete by: As directed    Diet - low sodium heart healthy   Complete by: As directed    Discharge instructions   Complete by: As directed    See Rectal Surgery instruction sheet   Discharge instructions   Complete by: As directed    See Rectal Surgery instruction sheet   Discharge wound care:   Complete by: As directed    -Allow any ribbon or fluffed gauze to fall out with the 1st bowel movement -Bathe / shower every day.  Just warm water.  Avoid salts/soaps.  Keep the area clean by showering / bathing over the incision / wound.   It is okay to soak an open wound to help wash it.     -Wet wipes or showers / gentle washing after bowel movements is often less traumatic than regular toilet paper.  Consider using a squeeze bottle of warm water to rinse the perianal region. -You will notice bleeding & yellow drainage with bowel movements.  This should slow down by the end of the first week of surgery, but expect some drainage for many weeks.  Wear an absorbent pad or soft cotton gauze in your underwear as needed to catch any drainage and help keep the area clean and dry.   Discharge wound care:   Complete by: As directed    -Allow any ribbon or fluffed gauze to fall out with the 1st bowel movement -Bathe / shower every day.  Just warm water.  Avoid salts/soaps.  Keep the area clean by showering / bathing over the incision / wound.   It is okay  to soak an open wound to help wash it.   -Wet wipes or showers / gentle washing after bowel movements is often less traumatic than regular toilet paper.  Consider using a squeeze bottle of warm water to rinse the perianal region. -You will notice bleeding & yellow drainage with bowel movements.  This should slow down by the end of the first week of surgery, but expect some drainage for many weeks.  Wear an absorbent pad or soft cotton gauze in your underwear as needed to catch any drainage and help keep the area clean and dry.   Driving Restrictions   Complete by: As directed    You may drive when you are no longer taking prescription pain medication, you can comfortably sit for long periods of time, and you can safely maneuver your car and apply brakes.   Driving Restrictions   Complete by: As directed    You may drive when you are no longer taking prescription pain medication, you can comfortably sit for long periods of time, and you can safely maneuver your car and apply brakes.   Increase activity slowly   Complete by: As directed    Increase activity slowly   Complete by: As directed    Lifting restrictions   Complete by: As directed     You may resume regular (light) daily activities beginning the next day-such as daily self-care, walking, climbing stairs-gradually increasing activities as tolerated.  If you can walk 30 minutes without difficulty, it is safe to try more intense activity such as jogging, treadmill, bicycling, low-impact aerobics, swimming, etc. Save the most intensive and strenuous activity for last such as sit-ups, heavy lifting, contact sports, etc  Refrain from any heavy lifting or straining until you are off narcotics for pain control.  Remember: if it hurts to do it, don't do it: STOP   Lifting restrictions   Complete by: As directed    You may resume regular (light) daily activities beginning the next day-such as daily self-care, walking, climbing stairs-gradually increasing activities as tolerated.  If you can walk 30 minutes without difficulty, it is safe to try more intense activity such as jogging, treadmill, bicycling, low-impact aerobics, swimming, etc. Save the most intensive and strenuous activity for last such as sit-ups, heavy lifting, contact sports, etc  Refrain from any heavy lifting or straining until you are off narcotics for pain control.  Remember: if it hurts to do it, don't do it: STOP   May shower / Bathe   Complete by: As directed    Warm water sitz baths/tub soaks x 20-30 minutes, 4-8 times a day for comfort   May shower / Bathe   Complete by: As directed    Warm water sitz baths/tub soaks x 20-30 minutes, 4-8 times a day for comfort   May walk up steps   Complete by: As directed    May walk up steps   Complete by: As directed    Sexual Activity Restrictions   Complete by: As directed    You may have sexual intercourse when it is comfortable. If it hurts to do it, STOP.   Sexual Activity Restrictions   Complete by: As directed    You may have sexual intercourse when it is comfortable. If it hurts to do it, STOP.      Allergies as of 05/14/2019      Reactions   Neomycin  Nausea And Vomiting   Ambien [zolpidem Tartrate] Other (See Comments)   Hallucinations  Amitriptyline Other (See Comments)   Stops heart per pt   Ampicillin Other (See Comments)   Stops heart per pt   Anaprox [naproxen Sodium] Other (See Comments)   Doesn't work per pt   Benadryl [diphenhydramine Hcl (sleep)] Other (See Comments)   Rash   Cetirizine & Related Other (See Comments)   Swelling   Cortizone-10 [hydrocortisone] Other (See Comments)   seizures   Darvon [propoxyphene] Other (See Comments)   Increased heart rate per pt   Diazepam Other (See Comments)   Stops heart per pt   Diflunisal Swelling, Other (See Comments)   Stops heart per pt (Dolobid)   Duloxetine Other (See Comments)   Reaction to Cymbalta - pt doesn't remember what the reaction was   Flexeril [cyclobenzaprine] Other (See Comments)   Seizures   Lactose Intolerance (gi) Swelling, Other (See Comments)   cramping   Lidocaine Other (See Comments)   Seizures   Meperidine And Related Other (See Comments)   Stops heart rate (reaction to Demerol)   Metanx [l-methylfolate-algae-b12-b6] Other (See Comments)   Pt does not remember reaction   Metoclopramide Other (See Comments)   Pt does not remember reaction   Morphine And Related Other (See Comments)   Stops heart per pt   Nuprin [ibuprofen] Other (See Comments)   Caused headache   Oxycodone Other (See Comments)   Hallucinations   Penicillins Other (See Comments)   Stops heart per pt   Propoxyphene Other (See Comments)   Slowed heart rate per pt (reaction to Darvocet)   Ranitidine Other (See Comments)   Pt does not remember reaction   Vicodin [hydrocodone-acetaminophen] Other (See Comments)   Seizures   Tylenol [acetaminophen] Rash      Medication List    TAKE these medications   azathioprine 100 MG tablet Commonly known as: IMURAN Take 1 tablet (100 mg total) by mouth daily. What changed: when to take this   levothyroxine 50 MCG tablet Commonly  known as: SYNTHROID Take 50 mcg by mouth daily before breakfast.   omeprazole 20 MG capsule Commonly known as: PRILOSEC Take 40 mg by mouth daily before breakfast.   traMADol-acetaminophen 37.5-325 MG tablet Commonly known as: ULTRACET Take 2 tablets by mouth every 4 (four) hours as needed (pain.).            Discharge Care Instructions  (From admission, onward)         Start     Ordered   05/14/19 0000  Discharge wound care:    Comments: -Allow any ribbon or fluffed gauze to fall out with the 1st bowel movement -Bathe / shower every day.  Just warm water.  Avoid salts/soaps.  Keep the area clean by showering / bathing over the incision / wound.   It is okay to soak an open wound to help wash it.   -Wet wipes or showers / gentle washing after bowel movements is often less traumatic than regular toilet paper.  Consider using a squeeze bottle of warm water to rinse the perianal region. -You will notice bleeding & yellow drainage with bowel movements.  This should slow down by the end of the first week of surgery, but expect some drainage for many weeks.  Wear an absorbent pad or soft cotton gauze in your underwear as needed to catch any drainage and help keep the area clean and dry.   05/14/19 0753   05/11/19 0000  Discharge wound care:    Comments: -Allow any ribbon or fluffed gauze  to fall out with the 1st bowel movement -Bathe / shower every day.  Just warm water.  Avoid salts/soaps.  Keep the area clean by showering / bathing over the incision / wound.   It is okay to soak an open wound to help wash it.   -Wet wipes or showers / gentle washing after bowel movements is often less traumatic than regular toilet paper.  Consider using a squeeze bottle of warm water to rinse the perianal region. -You will notice bleeding & yellow drainage with bowel movements.  This should slow down by the end of the first week of surgery, but expect some drainage for many weeks.  Wear an absorbent pad  or soft cotton gauze in your underwear as needed to catch any drainage and help keep the area clean and dry.   05/11/19 1143          Significant Diagnostic Studies:  No results found for this or any previous visit (from the past 72 hour(s)).  No results found.  Past Medical History:  Diagnosis Date  . Adenomatous polyp of ascending colon   . Anxiety   . Bilateral lower extremity edema    burning sensations  . Bipolar 1 disorder ([place])   . Carpal tunnel syndrome   . Cirrhosis of liver ([place])   . Cutaneous horn   . Drug-induced Parkinsonism ([place])    patient unaware  . Esophageal varices ([place])   . Fibromyalgia    patient denies  . Gastric ulcer   . GERD (gastroesophageal reflux disease)   . Gingival abscess 08/26/2016  . Glaucoma 2003   pt unaware  . Heart attack ([place])   . Heart disease   . Hepatitis, autoimmune ([place]) 08/25/2014  . History of iron deficiency anemia   . Hypotension   . Hypothyroidism   . Migraines   . Neutropenia ([place])   . Osteoporosis   . Pacemaker Medtronic    MRI compatible  . Paranoid schizophrenia ([place])   . Polyarthritis   . Polyosteoarthritis   . Rectal mass   . Seizures ([place])    last sz 08/21/14  . Sinus arrest   . Skin cancer    forehead  . SSS (sick sinus syndrome) ([place])   . Stroke ([place])   . Thrombocytopenia ([place])   . Vertigo    bvvp    Past Surgical History:  Procedure Laterality Date  . BIOPSY  03/06/2019   Procedure: BIOPSY;  Surgeon: Thornton Park, MD;  Location: [facility];  Service: Gastroenterology;;  EGD and COLON  . CATARACT EXTRACTION Left 2008   pt unaware  . CHOLECYSTECTOMY    . COLONOSCOPY WITH PROPOFOL N/A 03/06/2019   Procedure: COLONOSCOPY WITH PROPOFOL;  Surgeon: Thornton Park, MD;  Location: [facility];  Service: Gastroenterology;  Laterality: N/A;  . ESOPHAGOGASTRODUODENOSCOPY (EGD) WITH PROPOFOL N/A 03/06/2019   Procedure: ESOPHAGOGASTRODUODENOSCOPY (EGD) WITH PROPOFOL ;  Surgeon: Thornton Park, MD;   Location: [facility];  Service: Gastroenterology;  Laterality: N/A;  . KNEE SURGERY Left 1985  . PACEMAKER INSERTION  2014  . PARTIAL HYSTERECTOMY  1979  . PARTIAL PROCTECTOMY BY TEM N/A 05/10/2019   Procedure: TEM PARTIAL PROCTECTOMY OF RECTAL MASS WITH EXCISION RIGHT PERINEAL SKIN MASS;  Surgeon: Michael Boston, MD;  Location: [facility];  Service: General;  Laterality: N/A;  . POLYPECTOMY  03/06/2019   Procedure: POLYPECTOMY;  Surgeon: Thornton Park, MD;  Location: [facility];  Service: Gastroenterology;;  . REFRACTIVE SURGERY    . TUBAL LIGATION  Social History   Socioeconomic History  . Marital status: Widowed    Spouse name: Not on file  . Number of children: Not on file  . Years of education: Not on file  . Highest education level: Not on file  Occupational History  . Not on file  Tobacco Use  . Smoking status: Former Smoker    Quit date: 03/02/2003    Years since quitting: 16.2  . Smokeless tobacco: Never Used  Substance and Sexual Activity  . Alcohol use: No  . Drug use: No  . Sexual activity: Not on file  Other Topics Concern  . Not on file  Social History Narrative   Lives alone, "have friends and relatives that come and help me"   caffeine use- coffee -1 cup daily   Social Determinants of Health   Financial Resource Strain: Low Risk   . Difficulty of Paying Living Expenses: Not very hard  Food Insecurity: No Food Insecurity  . Worried About Charity fundraiser in the Last Year: Never true  . Ran Out of Food in the Last Year: Never true  Transportation Needs: Unmet Transportation Needs  . Lack of Transportation (Medical): Yes  . Lack of Transportation (Non-Medical): Yes  Physical Activity: Inactive  . Days of Exercise per Week: 0 days  . Minutes of Exercise per Session: 0 min  Stress: Stress Concern Present  . Feeling of Stress : To some extent  Social Connections: Somewhat Isolated  . Frequency of Communication with Friends and Family: More than  three times a week  . Frequency of Social Gatherings with Friends and Family: More than three times a week  . Attends Religious Services: 1 to 4 times per year  . Active Member of Clubs or Organizations: No  . Attends Archivist Meetings: Never  . Marital Status: Widowed  Intimate Partner Violence: Not At Risk  . Fear of Current or Ex-Partner: No  . Emotionally Abused: No  . Physically Abused: No  . Sexually Abused: No    Family History  Problem Relation Age of Onset  . Heart attack Father   . Heart disease Paternal Uncle   . Heart disease Paternal Grandmother   . Diabetes Paternal Grandmother   . Heart disease Paternal Grandfather   . Breast cancer Mother   . Cancer Maternal Grandmother   . CVA Maternal Grandmother   . Cancer Maternal Grandfather   . CVA Maternal Grandfather   . Diabetes Sister   . Diabetes Brother   . Diabetes Brother   . Stroke Daughter     Current Facility-Administered Medications  Medication Dose Route Frequency Provider Last Rate Last Admin  . 0.9 %  sodium chloride infusion  250 mL Intravenous PRN Michael Boston, MD      . alum & mag hydroxide-simeth (MAALOX/MYLANTA) 200-200-20 MG/5ML suspension 30 mL  30 mL Oral Q6H PRN Michael Boston, MD      . azathioprine (IMURAN) tablet 100 mg  100 mg Oral Q1500 Michael Boston, MD   100 mg at 05/13/19 1639  . enoxaparin (LOVENOX) injection 40 mg  40 mg Subcutaneous Q24H Michael Boston, MD   40 mg at 05/13/19 0736  . gabapentin (NEURONTIN) capsule 200 mg  200 mg Oral TID Michael Boston, MD   200 mg at 05/13/19 2149  . levothyroxine (SYNTHROID) tablet 50 mcg  50 mcg Oral QAC breakfast Michael Boston, MD   50 mcg at 05/14/19 0451  . lip balm (CARMEX) ointment 1 application  1 application Topical BID Michael Boston, MD   1 application at 26/71/24 2149  . magic mouthwash  15 mL Oral QID PRN Michael Boston, MD      . metoprolol tartrate (LOPRESSOR) injection 5 mg  5 mg Intravenous Q6H PRN Michael Boston, MD      .  ondansetron [facility]) tablet 4 mg  4 mg Oral Q6H PRN Michael Boston, MD   4 mg at 05/11/19 0526   Or  . ondansetron (ZOFRAN) injection 4 mg  4 mg Intravenous Q6H PRN Michael Boston, MD      . pantoprazole (PROTONIX) EC tablet 80 mg  80 mg Oral Daily Michael Boston, MD   80 mg at 05/13/19 1008  . prochlorperazine (COMPAZINE) tablet 10 mg  10 mg Oral Q6H PRN Michael Boston, MD       Or  . prochlorperazine (COMPAZINE) injection 5-10 mg  5-10 mg Intravenous Q6H PRN Michael Boston, MD      . sodium chloride flush (NS) 0.9 % injection 3 mL  3 mL Intravenous Gorden Harms, MD   3 mL at 05/13/19 2149  . sodium chloride flush (NS) 0.9 % injection 3 mL  3 mL Intravenous PRN Michael Boston, MD      . traMADol-acetaminophen (ULTRACET) 37.5-325 MG per tablet 2 tablet  2 tablet Oral Q4H PRN Michael Boston, MD   2 tablet at 05/12/19 2138     Allergies  Allergen Reactions  . Neomycin Nausea And Vomiting  . Ambien [Zolpidem Tartrate] Other (See Comments)    Hallucinations   . Amitriptyline Other (See Comments)    Stops heart per pt  . Ampicillin Other (See Comments)    Stops heart per pt   . Anaprox [Naproxen Sodium] Other (See Comments)    Doesn't work per pt  . Benadryl [Diphenhydramine Hcl (Sleep)] Other (See Comments)    Rash  . Cetirizine & Related Other (See Comments)    Swelling   . Cortizone-10 [Hydrocortisone] Other (See Comments)    seizures   . Darvon [Propoxyphene] Other (See Comments)    Increased heart rate per pt   . Diazepam Other (See Comments)    Stops heart per pt  . Diflunisal Swelling and Other (See Comments)    Stops heart per pt (Dolobid)  . Duloxetine Other (See Comments)    Reaction to Cymbalta - pt doesn't remember what the reaction was  . Flexeril [Cyclobenzaprine] Other (See Comments)    Seizures   . Lactose Intolerance (Gi) Swelling and Other (See Comments)    cramping  . Lidocaine Other (See Comments)    Seizures  . Meperidine And Related Other (See  Comments)    Stops heart rate (reaction to Demerol)  . Metanx [L-Methylfolate-Algae-B12-B6] Other (See Comments)    Pt does not remember reaction  . Metoclopramide Other (See Comments)    Pt does not remember reaction  . Morphine And Related Other (See Comments)    Stops heart per pt   . Nuprin [Ibuprofen] Other (See Comments)    Caused headache  . Oxycodone Other (See Comments)    Hallucinations   . Penicillins Other (See Comments)    Stops heart per pt   . Propoxyphene Other (See Comments)    Slowed heart rate per pt (reaction to Darvocet)  . Ranitidine Other (See Comments)    Pt does not remember reaction  . Vicodin [Hydrocodone-Acetaminophen] Other (See Comments)    Seizures   . Tylenol [Acetaminophen] Rash    Signed:  Morton Peters, MD, FACS, MASCRS Gastrointestinal and Minimally Invasive Surgery  [facility] [address], [place], [place] [id number] [phone] Main / Paging [phone] Fax     05/14/2019, 7:54 AM

## 2019-05-14 NOTE — Progress Notes (Addendum)
Stacey Velazquez [id number] [birth year]  CARE TEAM:  PCP: Rocco Serene, MD  Outpatient Care Team: Patient Care Team: Rocco Serene, MD as PCP - General (Internal Medicine) [name], MD as PCP - Electrophysiology (Cardiology) [name], MD as Consulting Physician (Cardiology) Michael Boston, MD as Consulting Physician (General Surgery)  Inpatient Treatment Team: Treatment Team: Attending Provider: Michael Boston, MD; Utilization Review: Orlean Bradford, RN; Technician: Leda Quail, NT; Registered Nurse: Sunny Schlein, RN; Occupational Therapist: Betsy Pries, OT; Physical Therapy Assistant: Diego Cory, PTA; Utilization Review: Beau Fanny, RN; Technician: Cloria Spring, [place]; Registered Nurse: Guadlupe Spanish, RN   Problem List:   Principal Problem:   Adenomatous polyp of rectum s/p TEM partial proctectomy 05/09/2019 Active Problems:   Autoimmune hepatitis ([place])   Thrombocytopenia due to drugs   Hepatic cirrhosis (HCC)   Bipolar 1 disorder ([place])   Pacemaker Medtronic   Azathioprine-induced leukopenia (HCC)   Hypothyroidism   Seizure disorder (HCC)   Heart disease   Glaucoma   GERD (gastroesophageal reflux disease)   Anxiety   Paranoid schizophrenia ([place])   Obesity, Class II, BMI 35-39.9   Limited mobility   4 Days Post-Op  05/10/2019  POST-OPERATIVE DIAGNOSIS:   BLEEDING RECTAL POLYP WITH HIGH GRADE DYSPLASIA Grade 4 right posterior and left posterior hemorrhoidS RIGHT PERINEAL SKIN MASS  PROCEDURE:   TEM PARTIAL PROCTECTOMY OF RECTAL MASS EXCISION OF PERINEAL SKIN MASS Hemorrhoidal ligation x2  SURGEON:  Adin Hector, MD     Assessment  Back to baseline  [facility] Stay = 4 days)  Plan:  Follow-up on pathology.  On solid diet.  Pain control.  Ideally would be at a skilled facility with more direct assistant.  Patient against declines.  She is back to her baseline function which she says she can  managed at her assisted living facility with the help of neighbors and home health.  Ask physical therapy to evaluate 1 more time for further recommendations.  Cirrhosis but no evidence of any mental instability.  Back to her baseline.  No encephalopathy.  No more hematochezia.  Hemoglobin stable.  In general, she is stable for discharge.  While I am concerned about her likelihood of readmission given her and her medically fragile state, she is back to her usual baseline and has been able to function at home.  She wishes to go back to that situation and notes that she does have help in her community to help make sure things are dropped.  We will give her the chance to control her disposition -VTE prophylaxis- SCDs, etc -mobilize as tolerated to help recovery  45 minutes spent in review, evaluation, examination, counseling, and coordination of care.  More than 50% of that time was spent in counseling.  05/14/2019    Subjective: (Chief complaint)  Denies pain.  On solid diet.  Again needing help transitioning to walker, but that is her baseline  Objective:  Vital signs:  Vitals:   05/13/19 1407 05/13/19 2158 05/14/19 0451 05/14/19 0556  BP: 129/85 111/71  115/63  Pulse: 75 74  72  Resp: 18 18  18   Temp: 97.9 F (36.6 C) 98.4 F (36.9 C)  98 F (36.7 C)  TempSrc: Oral Oral  Oral  SpO2: 99% 97%  94%  Weight:   109.2 kg   Height:        Last BM Date: 05/12/19  Intake/Output   Yesterday:  04/18 0701 - 04/19 0700 In:  1640 [P.O.:1640] Out: 2650 [Urine:2650] This shift:  No intake/output data recorded.  Bowel function:  Flatus: YES  BM:  YES  Drain: (No drain)   Physical Exam:  General: Pt awake/alert in no acute distress Eyes: PERRL, normal EOM.  Sclera clear.  No icterus Neuro: CN II-XII intact w/o focal sensory/motor deficits. Lymph: No head/neck/groin lymphadenopathy Psych:  No delerium/psychosis/paranoia.  Oriented x 4 HENT: Normocephalic, Mucus  membranes moist.  No thrush Neck: Supple, No tracheal deviation.  No obvious thyromegaly Chest: No pain to chest wall compression.  Good respiratory excursion.  No audible wheezing CV:  Pulses intact.  Regular rhythm.  No major extremity edema MS: Normal AROM mjr joints.  No obvious deformity  Abdomen: Soft.  Nondistended.  Nontender.  No evidence of peritonitis.  No incarcerated hernias.  GU: Urine wick in place. Rectal: Deferred given recent surgery and asymptomatic. Ext:  No deformity.  No mjr edema.  No cyanosis Skin: No petechiae / purpurea.  No major sores.  Warm and dry    Results:   Cultures: Recent Results (from the past 720 hour(s))  SARS CORONAVIRUS 2 (TAT 6-24 HRS) Nasopharyngeal Nasopharyngeal Swab     Status: None   Collection Time: 05/07/19 10:43 AM   Specimen: Nasopharyngeal Swab  Result Value Ref Range Status   SARS Coronavirus 2 NEGATIVE NEGATIVE Final    Comment: (NOTE) SARS-CoV-2 target nucleic acids are NOT DETECTED. The SARS-CoV-2 RNA is generally detectable in upper and lower respiratory specimens during the acute phase of infection. Negative results do not preclude SARS-CoV-2 infection, do not rule out co-infections with other pathogens, and should not be used as the sole basis for treatment or other patient management decisions. Negative results must be combined with clinical observations, patient history, and epidemiological information. The expected result is Negative. Fact Sheet for Patients: SugarRoll.be Fact Sheet for Healthcare Providers: https://www.woods-mathews.com/ This test is not yet approved or cleared by the [place] FDA and  has been authorized for detection and/or diagnosis of SARS-CoV-2 by FDA under an Emergency Use Authorization (EUA). This EUA will remain  in effect (meaning this test can be used) for the duration of the COVID-19 declaration under Section 56 4(b)(1) of the Act, 21 U.S.C.  section 360bbb-3(b)(1), unless the authorization is terminated or revoked sooner. Performed at [facility], [place] [address] [place], [place] [address]     Labs: No results found for this or any previous visit (from the past 48 hour(s)).  Imaging / Studies: No results found.  Medications / Allergies: per chart  Antibiotics: Anti-infectives (From admission, onward)   Start     Dose/Rate Route Frequency Ordered Stop   05/10/19 2200  clindamycin (CLEOCIN) IVPB 900 mg     900 mg 100 mL/hr over 30 Minutes Intravenous Every 8 hours 05/10/19 1709 05/11/19 1636   05/10/19 1400  neomycin (MYCIFRADIN) tablet 1,000 mg  Status:  Discontinued     1,000 mg Oral 3 times per day 05/10/19 1118 05/10/19 1708   05/10/19 1400  metroNIDAZOLE (FLAGYL) tablet 1,000 mg  Status:  Discontinued     1,000 mg Oral 3 times per day 05/10/19 1118 05/10/19 1708   05/10/19 1130  clindamycin (CLEOCIN) IVPB 900 mg     900 mg 100 mL/hr over 30 Minutes Intravenous On call to O.R. 05/10/19 1117 05/10/19 1414   05/10/19 0600  clindamycin (CLEOCIN) 900 mg, gentamicin (GARAMYCIN) 240 mg in sodium chloride 0.9 % 1,000 mL for intraperitoneal lavage  Status:  Discontinued  Irrigation On call to O.R. 05/09/19 0802 05/10/19 1708   05/10/19 0600  gentamicin (GARAMYCIN) 390 mg in dextrose 5 % 100 mL IVPB     5 mg/kg  78.8 kg (Adjusted) 109.8 mL/hr over 60 Minutes Intravenous On call to O.R. 05/09/19 0804 05/10/19 1417        Note: Portions of this report may have been transcribed using voice recognition software. Every effort was made to ensure accuracy; however, inadvertent computerized transcription errors may be present.   Any transcriptional errors that result from this process are unintentional.     Adin Hector, MD, FACS, MASCRS Gastrointestinal and Minimally Invasive Surgery    [address] [place], [place] [address] [phone] Main / Paging [phone] Fax Please see  Amion for pager number, especial 5pm - 7am.

## 2019-05-14 NOTE — Telephone Encounter (Signed)
Pt reported that she recently had surgery and does not feel well enough to come in for lab work.

## 2019-05-15 DIAGNOSIS — I519 Heart disease, unspecified: Secondary | ICD-10-CM | POA: Diagnosis not present

## 2019-05-15 DIAGNOSIS — M79601 Pain in right arm: Secondary | ICD-10-CM | POA: Diagnosis not present

## 2019-05-15 DIAGNOSIS — F419 Anxiety disorder, unspecified: Secondary | ICD-10-CM | POA: Diagnosis not present

## 2019-05-15 DIAGNOSIS — I252 Old myocardial infarction: Secondary | ICD-10-CM | POA: Diagnosis not present

## 2019-05-15 DIAGNOSIS — G8929 Other chronic pain: Secondary | ICD-10-CM | POA: Diagnosis not present

## 2019-05-15 DIAGNOSIS — M159 Polyosteoarthritis, unspecified: Secondary | ICD-10-CM | POA: Diagnosis not present

## 2019-05-18 DIAGNOSIS — I252 Old myocardial infarction: Secondary | ICD-10-CM | POA: Diagnosis not present

## 2019-05-18 DIAGNOSIS — F419 Anxiety disorder, unspecified: Secondary | ICD-10-CM | POA: Diagnosis not present

## 2019-05-18 DIAGNOSIS — M79601 Pain in right arm: Secondary | ICD-10-CM | POA: Diagnosis not present

## 2019-05-18 DIAGNOSIS — M159 Polyosteoarthritis, unspecified: Secondary | ICD-10-CM | POA: Diagnosis not present

## 2019-05-18 DIAGNOSIS — G8929 Other chronic pain: Secondary | ICD-10-CM | POA: Diagnosis not present

## 2019-05-18 DIAGNOSIS — I519 Heart disease, unspecified: Secondary | ICD-10-CM | POA: Diagnosis not present

## 2019-05-21 DIAGNOSIS — I519 Heart disease, unspecified: Secondary | ICD-10-CM | POA: Diagnosis not present

## 2019-05-21 DIAGNOSIS — G8929 Other chronic pain: Secondary | ICD-10-CM | POA: Diagnosis not present

## 2019-05-21 DIAGNOSIS — M159 Polyosteoarthritis, unspecified: Secondary | ICD-10-CM | POA: Diagnosis not present

## 2019-05-21 DIAGNOSIS — I252 Old myocardial infarction: Secondary | ICD-10-CM | POA: Diagnosis not present

## 2019-05-21 DIAGNOSIS — M79601 Pain in right arm: Secondary | ICD-10-CM | POA: Diagnosis not present

## 2019-05-21 DIAGNOSIS — F419 Anxiety disorder, unspecified: Secondary | ICD-10-CM | POA: Diagnosis not present

## 2019-05-22 ENCOUNTER — Other Ambulatory Visit: Payer: Self-pay | Admitting: *Deleted

## 2019-05-22 ENCOUNTER — Encounter: Payer: Self-pay | Admitting: *Deleted

## 2019-05-22 NOTE — Patient Outreach (Signed)
[facility]) Care Management  05/22/2019  TELESA Velazquez [birth year] [id number]   Subjective: Telephone call to patient's home / mobile number, spoke with patient, and HIPAA verified.  Discussed [facility] Care Management Medicare EMMI General Discharge follow up, patient voiced understanding, and is in agreement to follow up.   Patient states she feeling better, remembers receiving the EMMI automated calls, and continuing to work on her recovery.   States she no longer feels sad, depressed, or hopeless.  States she is working on paperwork to renew her [place] apartment recertification this her priority for now and later resume her other projects (e.g. finding some one to assist with light housekeeping, follow up on her [place] and [employer]end[employer] benefit application, updating her Advanced Directives documents).    States she has worked with a [facility] in the past with her projects and will follow up with Social Worker if assistance is needed in the future.    States she has the following appointments scheduled: with surgeon on 05/29/2019, with primary MD on 06/04/2019, and with gastroenterologist on 06/12/2019.   States she is receiving home health services through [facility] and services are going well.   Patient states she is able to manage some self care and has assistance as needed. Patient voices understanding of medical diagnosis and treatment plan.  States she is accessing her Medicare benefits as needed via member services number on back of card.   Patient states she does not have any education material, EMMI follow up, care coordination, care management, disease monitoring, transportation, community resource, or pharmacy needs at this time.  States he is very appreciative of the follow up, is in agreement  to receive 1 additional follow up call to assess for further CM needs, and is in agreement to receive [place] Management EMMI follow up calls as needed.      Objective: Per  KPN (Knowledge Performance Now, point of care tool) and chart review, patient hospitalized 05/10/2019 - 05/14/2019 for Adenomatous polyp of rectum status post partial proctectomy 05/09/2019.    Patient hospitalized 03/05/2019 - 03/06/2019 for rectal bleeding,Gastric ulcer,Rectal polyp,Adenomatous polyp of ascending colon, [facility] preparation of EGD and colonoscopy on February 9 due to transportation issues.Patient also has a history ofanxiety, autoimmune hepatitis withsuspectedcirrhosis, bipolar 1 disorder, fibromyalgia, GERD, hypothyroidism, history of seizures last in 2016,Drug-induced Parkinsonism,Glaucoma,Heart attack,Pacemaker Medtronic,Paranoid schizophrenia,Polyarthritis, and stroke.     Assessment: Received Medicare EMMI General Discharge Red Flag Alert follow up referral on 05/21/2019 and 05/17/2019.    Red Flag Alert Trigger, Day#4,, Patient answered yes to the following questions: Sad/hopeless/anxious/empty?   Other questions/problems?   Red Flag Alert Trigger, Day #1, patient answered no to the following question: Scheduled follow-up?   EMMI follow up completed and will follow up to assess further care management needs.      Plan:RNCM will call patient for telephone outreach attempt, within 21  business days, EMMI follow up, to assess for further CM needs, and proceed with case closure, within 10 business days if no return call.        [name] Friendly, BSN, [place] Management [facility] Telephonic CM Phone: [phone] Fax: [phone]

## 2019-05-25 DIAGNOSIS — G8929 Other chronic pain: Secondary | ICD-10-CM | POA: Diagnosis not present

## 2019-05-25 DIAGNOSIS — F419 Anxiety disorder, unspecified: Secondary | ICD-10-CM | POA: Diagnosis not present

## 2019-05-25 DIAGNOSIS — M159 Polyosteoarthritis, unspecified: Secondary | ICD-10-CM | POA: Diagnosis not present

## 2019-05-25 DIAGNOSIS — I519 Heart disease, unspecified: Secondary | ICD-10-CM | POA: Diagnosis not present

## 2019-05-25 DIAGNOSIS — M79601 Pain in right arm: Secondary | ICD-10-CM | POA: Diagnosis not present

## 2019-05-25 DIAGNOSIS — I252 Old myocardial infarction: Secondary | ICD-10-CM | POA: Diagnosis not present

## 2019-05-26 DIAGNOSIS — K219 Gastro-esophageal reflux disease without esophagitis: Secondary | ICD-10-CM | POA: Diagnosis not present

## 2019-05-26 DIAGNOSIS — G2119 Other drug induced secondary parkinsonism: Secondary | ICD-10-CM | POA: Diagnosis not present

## 2019-05-26 DIAGNOSIS — I519 Heart disease, unspecified: Secondary | ICD-10-CM | POA: Diagnosis not present

## 2019-05-26 DIAGNOSIS — F319 Bipolar disorder, unspecified: Secondary | ICD-10-CM | POA: Diagnosis not present

## 2019-05-26 DIAGNOSIS — Z87891 Personal history of nicotine dependence: Secondary | ICD-10-CM | POA: Diagnosis not present

## 2019-05-26 DIAGNOSIS — M79601 Pain in right arm: Secondary | ICD-10-CM | POA: Diagnosis not present

## 2019-05-26 DIAGNOSIS — D702 Other drug-induced agranulocytosis: Secondary | ICD-10-CM | POA: Diagnosis not present

## 2019-05-26 DIAGNOSIS — F419 Anxiety disorder, unspecified: Secondary | ICD-10-CM | POA: Diagnosis not present

## 2019-05-26 DIAGNOSIS — Z9049 Acquired absence of other specified parts of digestive tract: Secondary | ICD-10-CM | POA: Diagnosis not present

## 2019-05-26 DIAGNOSIS — D6959 Other secondary thrombocytopenia: Secondary | ICD-10-CM | POA: Diagnosis not present

## 2019-05-26 DIAGNOSIS — G8929 Other chronic pain: Secondary | ICD-10-CM | POA: Diagnosis not present

## 2019-05-26 DIAGNOSIS — R42 Dizziness and giddiness: Secondary | ICD-10-CM | POA: Diagnosis not present

## 2019-05-26 DIAGNOSIS — H409 Unspecified glaucoma: Secondary | ICD-10-CM | POA: Diagnosis not present

## 2019-05-26 DIAGNOSIS — E6609 Other obesity due to excess calories: Secondary | ICD-10-CM | POA: Diagnosis not present

## 2019-05-26 DIAGNOSIS — G40909 Epilepsy, unspecified, not intractable, without status epilepticus: Secondary | ICD-10-CM | POA: Diagnosis not present

## 2019-05-26 DIAGNOSIS — K7460 Unspecified cirrhosis of liver: Secondary | ICD-10-CM | POA: Diagnosis not present

## 2019-05-26 DIAGNOSIS — Z483 Aftercare following surgery for neoplasm: Secondary | ICD-10-CM | POA: Diagnosis not present

## 2019-05-26 DIAGNOSIS — M797 Fibromyalgia: Secondary | ICD-10-CM | POA: Diagnosis not present

## 2019-05-26 DIAGNOSIS — E039 Hypothyroidism, unspecified: Secondary | ICD-10-CM | POA: Diagnosis not present

## 2019-05-26 DIAGNOSIS — Z950 Presence of cardiac pacemaker: Secondary | ICD-10-CM | POA: Diagnosis not present

## 2019-05-26 DIAGNOSIS — F200 Paranoid schizophrenia: Secondary | ICD-10-CM | POA: Diagnosis not present

## 2019-05-26 DIAGNOSIS — M150 Primary generalized (osteo)arthritis: Secondary | ICD-10-CM | POA: Diagnosis not present

## 2019-05-26 DIAGNOSIS — K754 Autoimmune hepatitis: Secondary | ICD-10-CM | POA: Diagnosis not present

## 2019-05-26 DIAGNOSIS — Z6832 Body mass index (BMI) 32.0-32.9, adult: Secondary | ICD-10-CM | POA: Diagnosis not present

## 2019-05-26 DIAGNOSIS — M25321 Other instability, right elbow: Secondary | ICD-10-CM | POA: Diagnosis not present

## 2019-05-30 DIAGNOSIS — M79601 Pain in right arm: Secondary | ICD-10-CM | POA: Diagnosis not present

## 2019-05-30 DIAGNOSIS — Z483 Aftercare following surgery for neoplasm: Secondary | ICD-10-CM | POA: Diagnosis not present

## 2019-05-30 DIAGNOSIS — F419 Anxiety disorder, unspecified: Secondary | ICD-10-CM | POA: Diagnosis not present

## 2019-05-30 DIAGNOSIS — M797 Fibromyalgia: Secondary | ICD-10-CM | POA: Diagnosis not present

## 2019-05-30 DIAGNOSIS — M150 Primary generalized (osteo)arthritis: Secondary | ICD-10-CM | POA: Diagnosis not present

## 2019-05-30 DIAGNOSIS — G8929 Other chronic pain: Secondary | ICD-10-CM | POA: Diagnosis not present

## 2019-06-01 DIAGNOSIS — M150 Primary generalized (osteo)arthritis: Secondary | ICD-10-CM | POA: Diagnosis not present

## 2019-06-01 DIAGNOSIS — G8929 Other chronic pain: Secondary | ICD-10-CM | POA: Diagnosis not present

## 2019-06-01 DIAGNOSIS — M797 Fibromyalgia: Secondary | ICD-10-CM | POA: Diagnosis not present

## 2019-06-01 DIAGNOSIS — M79601 Pain in right arm: Secondary | ICD-10-CM | POA: Diagnosis not present

## 2019-06-01 DIAGNOSIS — Z483 Aftercare following surgery for neoplasm: Secondary | ICD-10-CM | POA: Diagnosis not present

## 2019-06-01 DIAGNOSIS — F419 Anxiety disorder, unspecified: Secondary | ICD-10-CM | POA: Diagnosis not present

## 2019-06-04 DIAGNOSIS — Z6832 Body mass index (BMI) 32.0-32.9, adult: Secondary | ICD-10-CM | POA: Diagnosis not present

## 2019-06-04 DIAGNOSIS — M130 Polyarthritis, unspecified: Secondary | ICD-10-CM | POA: Diagnosis not present

## 2019-06-04 DIAGNOSIS — Z9049 Acquired absence of other specified parts of digestive tract: Secondary | ICD-10-CM | POA: Diagnosis not present

## 2019-06-04 DIAGNOSIS — E669 Obesity, unspecified: Secondary | ICD-10-CM | POA: Diagnosis not present

## 2019-06-04 DIAGNOSIS — G2119 Other drug induced secondary parkinsonism: Secondary | ICD-10-CM | POA: Diagnosis not present

## 2019-06-04 DIAGNOSIS — Z7189 Other specified counseling: Secondary | ICD-10-CM | POA: Diagnosis not present

## 2019-06-04 DIAGNOSIS — I495 Sick sinus syndrome: Secondary | ICD-10-CM | POA: Diagnosis not present

## 2019-06-04 DIAGNOSIS — Z993 Dependence on wheelchair: Secondary | ICD-10-CM | POA: Diagnosis not present

## 2019-06-04 DIAGNOSIS — Z008 Encounter for other general examination: Secondary | ICD-10-CM | POA: Diagnosis not present

## 2019-06-04 DIAGNOSIS — K754 Autoimmune hepatitis: Secondary | ICD-10-CM | POA: Diagnosis not present

## 2019-06-04 DIAGNOSIS — Z79899 Other long term (current) drug therapy: Secondary | ICD-10-CM | POA: Diagnosis not present

## 2019-06-04 DIAGNOSIS — Z950 Presence of cardiac pacemaker: Secondary | ICD-10-CM | POA: Diagnosis not present

## 2019-06-05 ENCOUNTER — Encounter (HOSPITAL_COMMUNITY): Payer: Self-pay | Admitting: Emergency Medicine

## 2019-06-05 ENCOUNTER — Emergency Department (HOSPITAL_COMMUNITY)
Admission: EM | Admit: 2019-06-05 | Discharge: 2019-06-05 | Disposition: A | Discharge status: Home / Self Care | Payer: Medicare Other | Attending: Emergency Medicine | Admitting: Emergency Medicine

## 2019-06-05 ENCOUNTER — Emergency Department (HOSPITAL_COMMUNITY): Payer: Medicare Other

## 2019-06-05 DIAGNOSIS — M150 Primary generalized (osteo)arthritis: Secondary | ICD-10-CM | POA: Diagnosis not present

## 2019-06-05 DIAGNOSIS — Z87891 Personal history of nicotine dependence: Secondary | ICD-10-CM | POA: Diagnosis not present

## 2019-06-05 DIAGNOSIS — R339 Retention of urine, unspecified: Secondary | ICD-10-CM | POA: Insufficient documentation

## 2019-06-05 DIAGNOSIS — Z79899 Other long term (current) drug therapy: Secondary | ICD-10-CM | POA: Insufficient documentation

## 2019-06-05 DIAGNOSIS — E039 Hypothyroidism, unspecified: Secondary | ICD-10-CM | POA: Insufficient documentation

## 2019-06-05 DIAGNOSIS — K5730 Diverticulosis of large intestine without perforation or abscess without bleeding: Secondary | ICD-10-CM | POA: Diagnosis not present

## 2019-06-05 DIAGNOSIS — R161 Splenomegaly, not elsewhere classified: Secondary | ICD-10-CM | POA: Insufficient documentation

## 2019-06-05 DIAGNOSIS — Z8673 Personal history of transient ischemic attack (TIA), and cerebral infarction without residual deficits: Secondary | ICD-10-CM | POA: Diagnosis not present

## 2019-06-05 DIAGNOSIS — G8929 Other chronic pain: Secondary | ICD-10-CM | POA: Diagnosis not present

## 2019-06-05 DIAGNOSIS — M797 Fibromyalgia: Secondary | ICD-10-CM | POA: Diagnosis not present

## 2019-06-05 DIAGNOSIS — F419 Anxiety disorder, unspecified: Secondary | ICD-10-CM | POA: Diagnosis not present

## 2019-06-05 DIAGNOSIS — R3911 Hesitancy of micturition: Secondary | ICD-10-CM | POA: Diagnosis present

## 2019-06-05 DIAGNOSIS — Z483 Aftercare following surgery for neoplasm: Secondary | ICD-10-CM | POA: Diagnosis not present

## 2019-06-05 DIAGNOSIS — Z85828 Personal history of other malignant neoplasm of skin: Secondary | ICD-10-CM | POA: Insufficient documentation

## 2019-06-05 DIAGNOSIS — Z950 Presence of cardiac pacemaker: Secondary | ICD-10-CM | POA: Diagnosis not present

## 2019-06-05 DIAGNOSIS — M79601 Pain in right arm: Secondary | ICD-10-CM | POA: Diagnosis not present

## 2019-06-05 LAB — BASIC METABOLIC PANEL
Anion gap: 6 (ref 5–15)
BUN: 15 mg/dL (ref 8–23)
CO2: 22 mmol/L (ref 22–32)
Calcium: 8.3 mg/dL — ABNORMAL LOW (ref 8.9–10.3)
Chloride: 107 mmol/L (ref 98–111)
Creatinine, Ser: 0.57 mg/dL (ref 0.44–1.00)
GFR calc Af Amer: >60 mL/min (ref >60)
GFR calc non Af Amer: >60 mL/min (ref >60)
Glucose, Bld: 85 mg/dL (ref 70–99)
Potassium: 4.4 mmol/L (ref 3.5–5.1)
Sodium: 135 mmol/L (ref 135–145)

## 2019-06-05 LAB — URINALYSIS, ROUTINE W REFLEX MICROSCOPIC
Bilirubin Urine: NEGATIVE
Glucose, UA: NEGATIVE mg/dL
Hgb urine dipstick: NEGATIVE
Ketones, ur: NEGATIVE mg/dL
Leukocytes,Ua: NEGATIVE
Nitrite: NEGATIVE
Protein, ur: NEGATIVE mg/dL
Specific Gravity, Urine: 1.017 (ref 1.005–1.030)
pH: 7 (ref 5.0–8.0)

## 2019-06-05 LAB — CBC
HCT: 37.7 % (ref 36.0–46.0)
Hemoglobin: 12.4 g/dL (ref 12.0–15.0)
MCH: 32.6 pg (ref 26.0–34.0)
MCHC: 32.9 g/dL (ref 30.0–36.0)
MCV: 99.2 fL (ref 80.0–100.0)
Platelets: 64 10*3/uL — ABNORMAL LOW (ref 150–400)
RBC: 3.8 MIL/uL — ABNORMAL LOW (ref 3.87–5.11)
RDW: 18.8 % — ABNORMAL HIGH (ref 11.5–15.5)
WBC: 1.9 10*3/uL — ABNORMAL LOW (ref 4.0–10.5)
nRBC: 0 % (ref 0.0–0.2)

## 2019-06-05 IMAGING — CT CT ABD-PELV W/ CM
2 of 5 series · 16 of 46 positions shown, 18 images · IV contrast (omnipaque)
Comparison: [DATE]

CLINICAL DATA: Abdominal pain and difficulty urinating.

EXAM:
CT ABDOMEN AND PELVIS WITH CONTRAST
TECHNIQUE: Multidetector CT imaging of the abdomen and pelvis was performed
using the standard protocol following bolus administration of
intravenous contrast.
CONTRAST:  100mL OMNIPAQUE IOHEXOL 300 MG/ML  SOLN

[Series 2: axial st · axial · 0.90mm/px · z∈[-404,-9]mm · 13 of 93 slices shown, 15 images]
[im 7/93  soft-tissue]
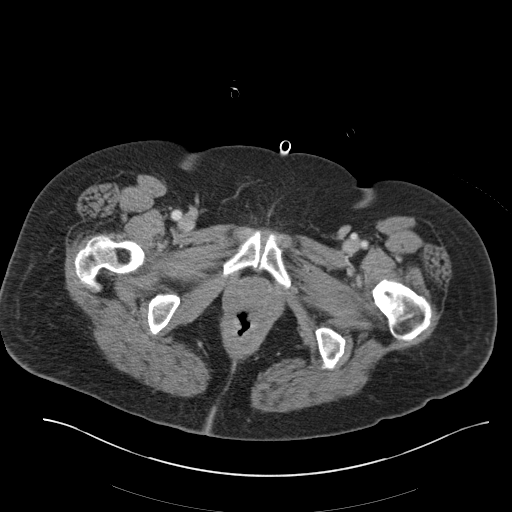
[im 7/93  bone]
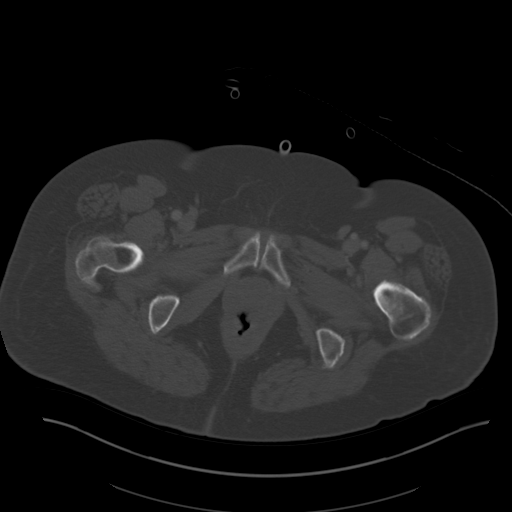
[im 14/93  soft-tissue]
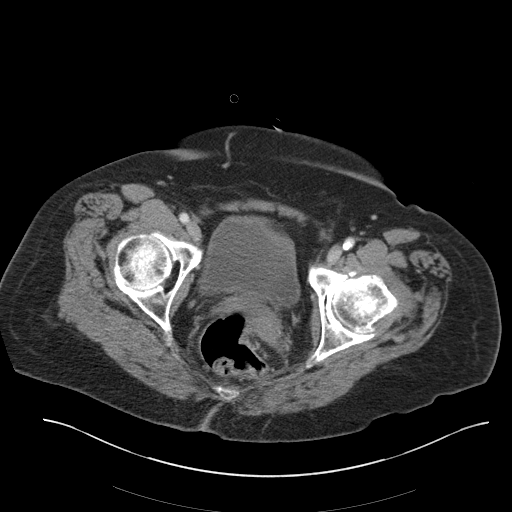
[im 20/93  soft-tissue]
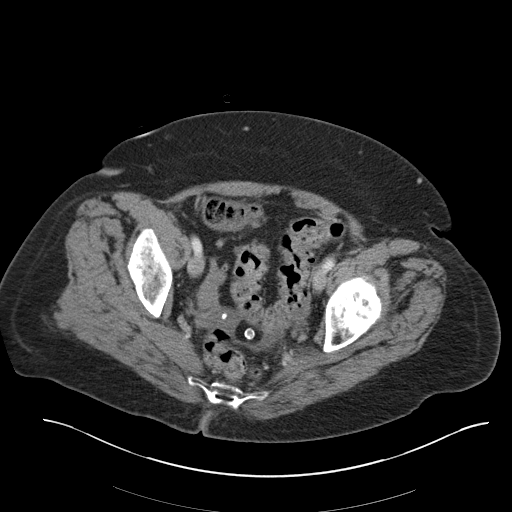
[im 27/93  soft-tissue]
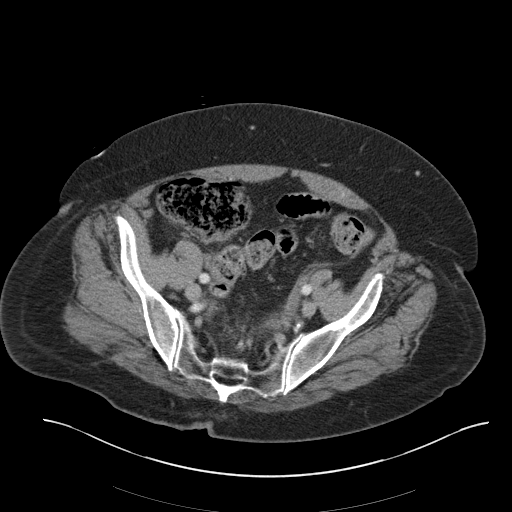
[im 33/93  soft-tissue]
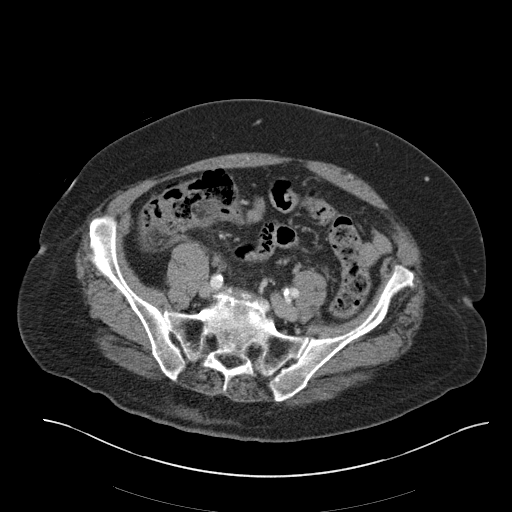
[im 40/93  soft-tissue]
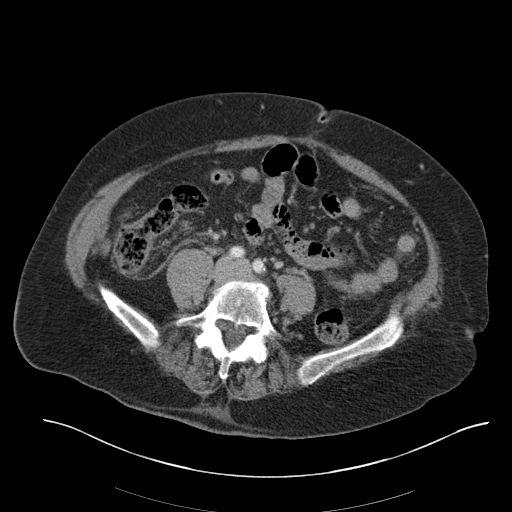
[im 47/93  soft-tissue]
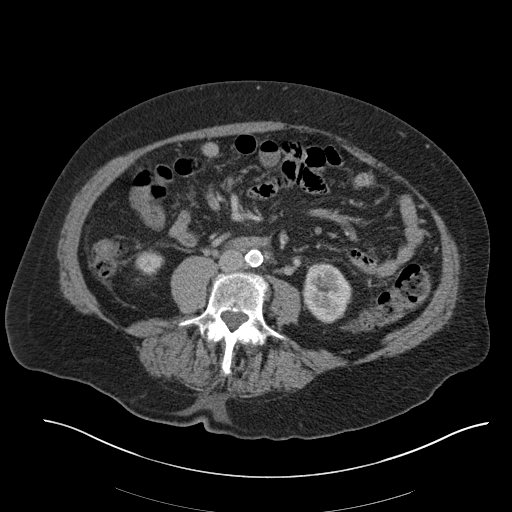
[im 53/93  soft-tissue]
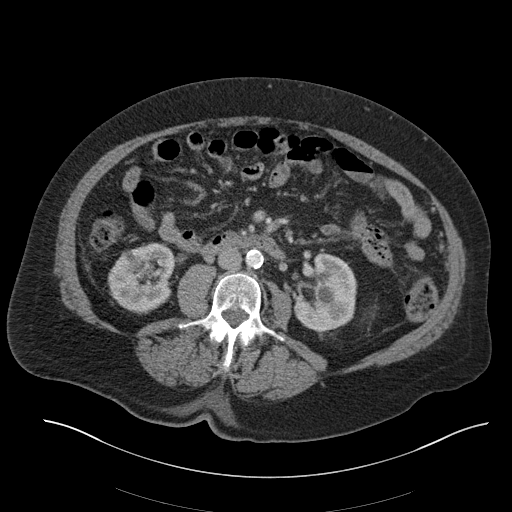
[im 60/93  soft-tissue]
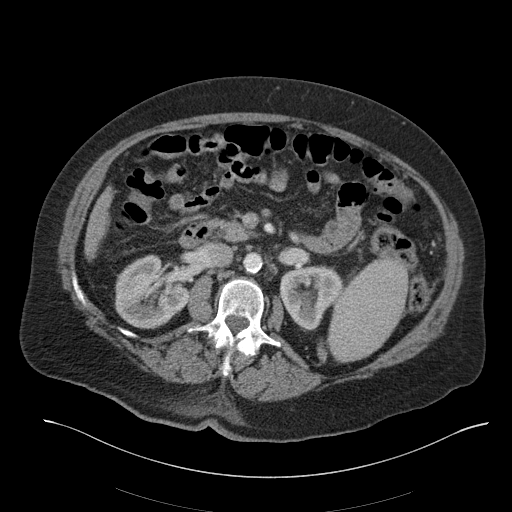
[im 60/93  bone]
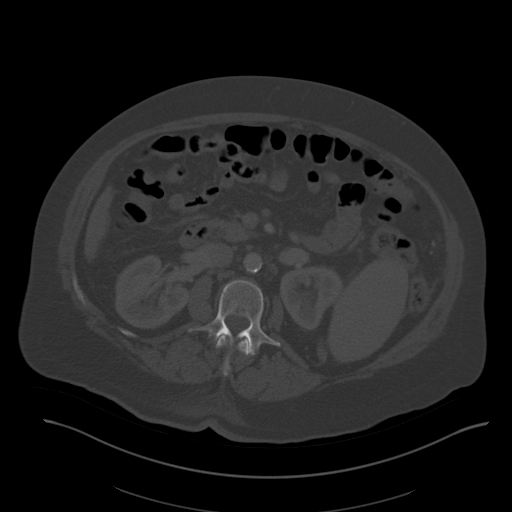
[im 66/93  soft-tissue]
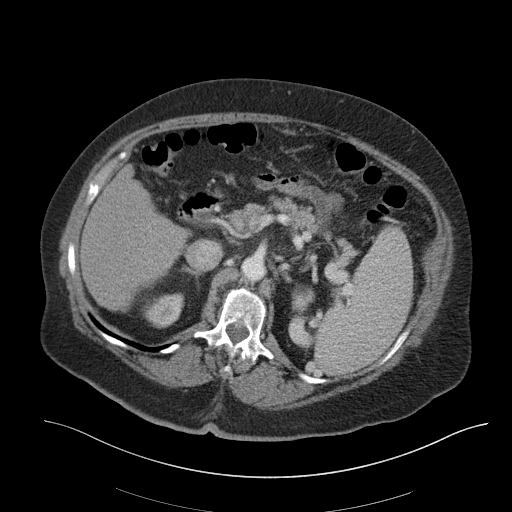
[im 73/93  soft-tissue]
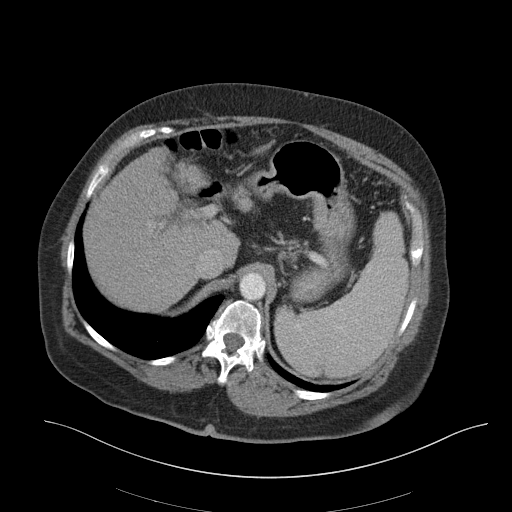
[im 79/93  soft-tissue]
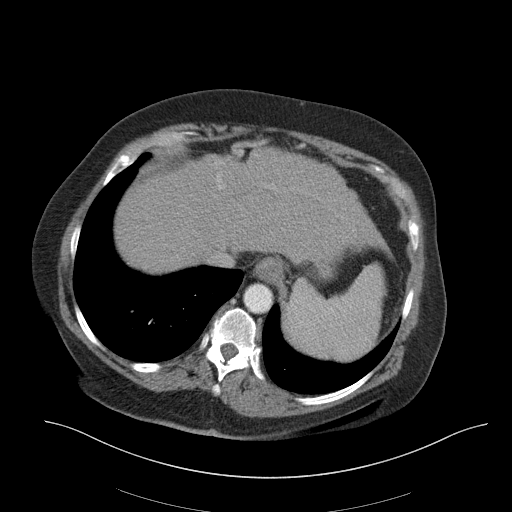
[im 86/93  soft-tissue]
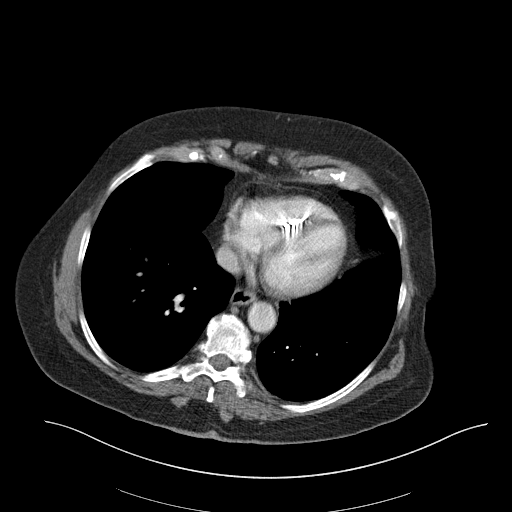

[Series 4: coronal st · coronal · 0.88mm/px · 3 of 170 slices shown]
[im 57/170  soft-tissue]
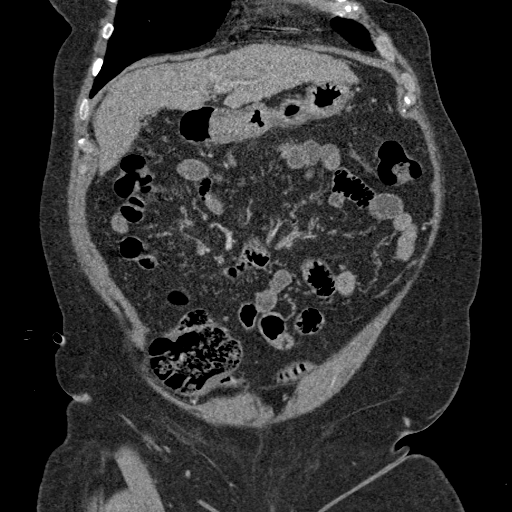
[im 76/170  soft-tissue]
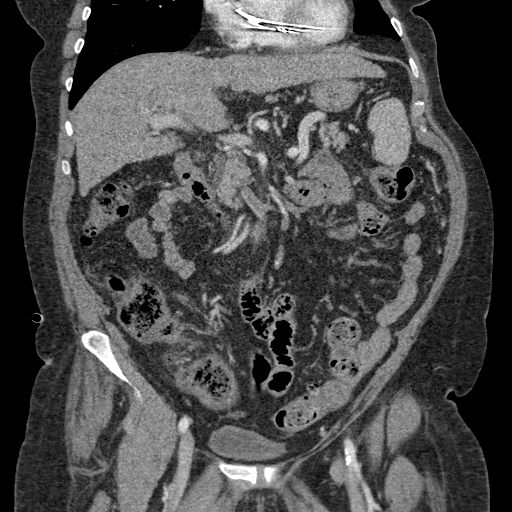
[im 94/170  soft-tissue]
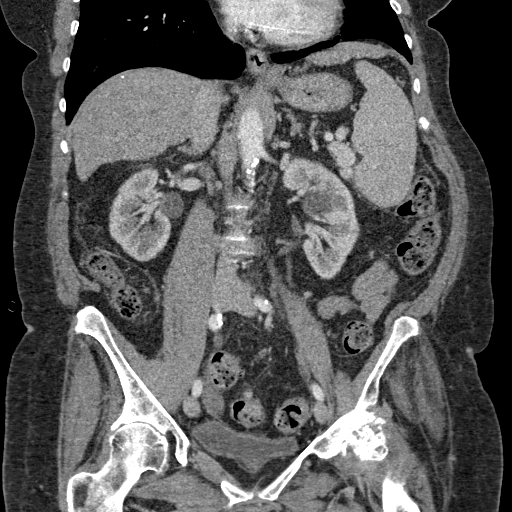

[16 of 46 positions shown; findings below may reference images not displayed]

FINDINGS: Lower chest: No acute abnormality.

Hepatobiliary: The liver is cirrhotic in appearance. No focal liver
abnormality is seen. Status post cholecystectomy. No biliary
dilatation.

Pancreas: Unremarkable. No pancreatic ductal dilatation or
surrounding inflammatory changes.

Spleen: There is moderate severity splenomegaly.

Adrenals/Urinary Tract: Adrenal glands are unremarkable. Kidneys are
normal in size, without renal calculi or hydronephrosis. 3.6 cm x
2.9 cm and 4.5 cm x 1.1 cm cysts are seen within the left kidney. A
1.1 cm x 0.6 cm cyst is seen within the anterior aspect of the mid
right kidney. Bladder is unremarkable.

Stomach/Bowel: Stomach is within normal limits. Appendix appears
normal. No evidence of bowel dilatation. Numerous diverticula are
seen throughout the large bowel.

Vascular/Lymphatic: Marked severity aortic calcification. No
enlarged abdominal or pelvic lymph nodes.

Reproductive: The uterus is not identified.

Other: No abdominal wall hernia or abnormality.

A trace amount of posterior pelvic free fluid is seen.

Musculoskeletal: Multilevel degenerative changes are seen throughout
the lumbar spine
IMPRESSION: 1. Moderate severity splenomegaly.
2. Evidence of prior cholecystectomy.
3. Colonic diverticulosis.

Aortic Atherosclerosis ([QJ]-[QJ]).

## 2019-06-05 MED ORDER — SODIUM CHLORIDE (PF) 0.9 % IJ SOLN
INTRAMUSCULAR | Status: AC
  Filled 2019-06-05: qty 50

## 2019-06-05 MED ORDER — IOHEXOL 300 MG/ML  SOLN
100.0000 mL | Freq: Once | INTRAMUSCULAR | Status: AC | PRN
  Administered 2019-06-05: 100 mL via INTRAVENOUS

## 2019-06-05 NOTE — Discharge Instructions (Addendum)
You were seen in the emergency department for difficulty passing urine and lower abdominal pain.  Your blood work was unchanged from prior.  Your urine did not show any sign of infection.  Your CAT scan did show an enlarged spleen and this will need to be followed up with your doctors.  Please also make an appointment with urology regarding your difficulty passing urine.  If you are unable to pass urine in the next 12 hours you may need to return to the emergency department for further evaluation.

## 2019-06-05 NOTE — ED Triage Notes (Signed)
Per pt, states she had abdominal surgery in mid April-states since then she is having difficulty urinating on and off-increased burning and frequency-states she was told she had a UTI but was not given a antibiotic

## 2019-06-05 NOTE — ED Provider Notes (Signed)
Signout from Dr. Karlton Lemon.  74 year old female with recent polypectomy here with decreased urine output for the last few days.  Bladder scan showed 250 cc.  Getting labs and CT imaging of abdomen and pelvis.  Disposition per results of testing. Physical Exam  BP 122/88   Pulse 72   Temp 98.3 F (36.8 C) (Oral)   Resp 18   Ht 5\' 9"  (1.753 m)   Wt 97.5 kg   SpO2 100%   BMI 31.75 kg/m   Physical Exam  ED Course/Procedures   Clinical Course as of Jun 04 1725  Tue Jun 05, 2019  1626 Patent with only ~250cc in bladder on bladder scan. Given tenderness in LLQ and recent rectal surgery will check labs and CT Abd/Pel.    [CS]  1980 Care of the patient will be signed out to Dr. Melina Copa at the change of shift.    [CS]    Clinical Course User Index [CS] Truddie Hidden, MD    Procedures  MDM  Patient CT does not show any obvious lower abdominal issues.  They do comment upon splenomegaly.  She has known cirrhosis question whether this is related.  She follows with [facility] so recommended she check back with him on this.  She also said she had urinary issues years ago and had a urologist.  We will also have her follow-up with [facility].  Return instructions discussed.      Hayden Rasmussen, MD 06/05/19 1816

## 2019-06-05 NOTE — ED Provider Notes (Signed)
[facility] DEPT Provider Note   CSN: [id number] Arrival date & time: 06/05/19  1439     History Chief Complaint  Patient presents with  . Dysuria    Stacey Velazquez is a 74 y.o. female.  HPI Patient with a history of rectal surgery for a large polyp about 3 weeks ago reports she had some mild urinary hesitancy post-op that resolved before discharge. She was doing well until Sunday when she reports a decrease in her urine output. She reports no urine output since Sunday despite drinking at least 40oz of liquid already today.     Past Medical History:  Diagnosis Date  . Adenomatous polyp of ascending colon   . Anxiety   . Bilateral lower extremity edema    burning sensations  . Bipolar 1 disorder ([place])   . Carpal tunnel syndrome   . Cirrhosis of liver ([place])   . Cutaneous horn   . Drug-induced Parkinsonism ([place])    patient unaware  . Esophageal varices ([place])   . Fibromyalgia    patient denies  . Gastric ulcer   . GERD (gastroesophageal reflux disease)   . Gingival abscess 08/26/2016  . Glaucoma 2003   pt unaware  . Heart attack ([place])   . Heart disease   . Hepatitis, autoimmune ([place]) 08/25/2014  . History of iron deficiency anemia   . Hypotension   . Hypothyroidism   . Migraines   . Neutropenia ([place])   . Osteoporosis   . Pacemaker Medtronic    MRI compatible  . Paranoid schizophrenia ([place])   . Polyarthritis   . Polyosteoarthritis   . Rectal mass   . Seizures ([place])    last sz 08/21/14  . Sinus arrest   . Skin cancer    forehead  . SSS (sick sinus syndrome) ([place])   . Stroke ([place])   . Thrombocytopenia ([place])   . Vertigo    bvvp    Patient Active Problem List   Diagnosis Date Noted  . Limited mobility 05/14/2019  . Adenomatous polyp of rectum s/p TEM partial proctectomy 05/09/2019 05/10/2019  . Hepatic cirrhosis ([place]) 04/09/2019  . Obesity, Class II, BMI 35-39.9 04/09/2019  . Hallucinations 03/13/2019  . Rectal bleeding  03/05/2019  . Vertigo   . Heart disease   . GERD (gastroesophageal reflux disease)   . Autoimmune hepatitis ([place])   . Anxiety   . Low back pain 10/18/2016  . Acute right hip pain 08/26/2016  . Seizure disorder ([place]) 10/14/2014  . Drug-induced Parkinson's disease ([place]) 10/14/2014  . Hypothyroidism 09/09/2014  . Azathioprine-induced leukopenia ([place]) 08/25/2014  . Thrombocytopenia due to drugs 08/25/2014  . Syncope   . Sinus arrest   . Pacemaker Medtronic   . Bipolar 1 disorder ([place])   . Paranoid schizophrenia ([place]) 03/25/2012  . Glaucoma 01/25/2001    Past Surgical History:  Procedure Laterality Date  . BIOPSY  03/06/2019   Procedure: BIOPSY;  Surgeon: Thornton Park, MD;  Location: [facility];  Service: Gastroenterology;;  EGD and COLON  . CATARACT EXTRACTION Left 2008   pt unaware  . CHOLECYSTECTOMY    . COLONOSCOPY WITH PROPOFOL N/A 03/06/2019   Procedure: COLONOSCOPY WITH PROPOFOL;  Surgeon: Thornton Park, MD;  Location: [facility];  Service: Gastroenterology;  Laterality: N/A;  . ESOPHAGOGASTRODUODENOSCOPY (EGD) WITH PROPOFOL N/A 03/06/2019   Procedure: ESOPHAGOGASTRODUODENOSCOPY (EGD) WITH PROPOFOL ;  Surgeon: Thornton Park, MD;  Location: [facility];  Service: Gastroenterology;  Laterality: N/A;  . KNEE SURGERY Left 1985  .  PACEMAKER INSERTION  2014  . PARTIAL HYSTERECTOMY  1979  . PARTIAL PROCTECTOMY BY TEM N/A 05/10/2019   Procedure: TEM PARTIAL PROCTECTOMY OF RECTAL MASS WITH EXCISION RIGHT PERINEAL SKIN MASS;  Surgeon: Michael Boston, MD;  Location: [facility];  Service: General;  Laterality: N/A;  . POLYPECTOMY  03/06/2019   Procedure: POLYPECTOMY;  Surgeon: Thornton Park, MD;  Location: [facility];  Service: Gastroenterology;;  . REFRACTIVE SURGERY    . TUBAL LIGATION       OB History   No obstetric history on file.     Family History  Problem Relation Age of Onset  . Heart attack Father   . Heart disease Paternal Uncle   . Heart disease  Paternal Grandmother   . Diabetes Paternal Grandmother   . Heart disease Paternal Grandfather   . Breast cancer Mother   . Cancer Maternal Grandmother   . CVA Maternal Grandmother   . Cancer Maternal Grandfather   . CVA Maternal Grandfather   . Diabetes Sister   . Diabetes Brother   . Diabetes Brother   . Stroke Daughter     Social History   Tobacco Use  . Smoking status: Former Smoker    Quit date: 03/02/2003    Years since quitting: 16.2  . Smokeless tobacco: Never Used  Substance Use Topics  . Alcohol use: No  . Drug use: No    Home Medications Prior to Admission medications   Medication Sig Start Date End Date Taking? Authorizing Provider  azathioprine (IMURAN) 100 MG tablet Take 1 tablet (100 mg total) by mouth daily. Patient taking differently: Take 100 mg by mouth daily in the afternoon.  04/10/19   Thornton Park, MD  levothyroxine (SYNTHROID) 50 MCG tablet Take 50 mcg by mouth daily before breakfast. 12/27/18   [provider]  omeprazole (PRILOSEC) 20 MG capsule Take 40 mg by mouth daily before breakfast.  03/23/19   [provider]  traMADol-acetaminophen (ULTRACET) 37.5-325 MG tablet Take 2 tablets by mouth every 4 (four) hours as needed (pain.).  04/03/19   [provider]    Allergies    Neomycin, Ambien [zolpidem tartrate], Amitriptyline, Ampicillin, Anaprox [naproxen sodium], Benadryl [diphenhydramine hcl (sleep)], Cetirizine & related, Cortizone-10 [hydrocortisone], Darvon [propoxyphene], Diazepam, Diflunisal, Duloxetine, Flexeril [cyclobenzaprine], Lactose intolerance (gi), Lidocaine, Meperidine and related, Metanx [l-methylfolate-algae-b12-b6], Metoclopramide, Morphine and related, Nuprin [ibuprofen], Oxycodone, Penicillins, Propoxyphene, Ranitidine, Vicodin [hydrocodone-acetaminophen], and Tylenol [acetaminophen]  Review of Systems   Review of Systems  Constitutional: Negative for fever.  HENT: Negative for congestion and sore  throat.   Respiratory: Negative for cough and shortness of breath.   Cardiovascular: Negative for chest pain.  Gastrointestinal: Negative for abdominal pain, diarrhea, nausea and vomiting.  Genitourinary: Positive for decreased urine volume and difficulty urinating. Negative for dysuria.  Musculoskeletal: Negative for myalgias.  Skin: Negative for rash.  Neurological: Negative for headaches.  Psychiatric/Behavioral: Negative for behavioral problems.    Physical Exam Updated Vital Signs BP 121/78 (BP Location: Left Arm)   Pulse 71   Temp 98.3 F (36.8 C) (Oral)   Resp 16   Ht 5\' 9"  (1.753 m)   Wt 97.5 kg   SpO2 100%   BMI 31.75 kg/m   Physical Exam Vitals and nursing note reviewed.  Constitutional:      Appearance: Normal appearance.  HENT:     Head: Normocephalic and atraumatic.     Nose: Nose normal.     Mouth/Throat:     Mouth: Mucous membranes are moist.  Eyes:     Extraocular Movements: Extraocular movements intact.     Conjunctiva/sclera: Conjunctivae normal.  Cardiovascular:     Rate and Rhythm: Normal rate.  Pulmonary:     Effort: Pulmonary effort is normal.     Breath sounds: Normal breath sounds.  Abdominal:     General: Abdomen is flat.     Palpations: Abdomen is soft.     Tenderness: There is abdominal tenderness (LLQ and suprapubic).  Musculoskeletal:        General: No swelling. Normal range of motion.     Cervical back: Neck supple.  Skin:    General: Skin is warm and dry.  Neurological:     General: No focal deficit present.     Mental Status: She is alert.  Psychiatric:        Mood and Affect: Mood normal.     ED Results / Procedures / Treatments   Labs (all labs ordered are listed, but only abnormal results are displayed) Labs Reviewed  URINALYSIS, [place] PANEL  CBC    EKG None  Radiology No results found.  Procedures Procedures (including critical care time)  Medications Ordered in  ED Medications - No data to display  ED Course  I have reviewed the triage vital signs and the nursing notes.  Pertinent labs & imaging results that were available during my care of the patient were reviewed by me and considered in my medical decision making (see chart for details).  Clinical Course as of Jun 04 1645  Tue Jun 05, 2019  1626 Patent with only ~250cc in bladder on bladder scan. Given tenderness in LLQ and recent rectal surgery will check labs and CT Abd/Pel.    [CS]  3614 Care of the patient will be signed out to Dr. Melina Copa at the change of shift.    [CS]    Clinical Course User Index [CS] Truddie Hidden, MD   MDM Rules/Calculators/A&P                      Patient with decreased UOP and LLQ tenderness after recent rectal surgery. Plan labs and CT.  Final Clinical Impression(s) / ED Diagnoses Final diagnoses:  None    Rx / DC Orders ED Discharge Orders    None       Truddie Hidden, MD 06/05/19 [phone]

## 2019-06-05 NOTE — ED Notes (Signed)
Bladder scan showed 225 ml

## 2019-06-06 ENCOUNTER — Other Ambulatory Visit: Payer: Self-pay | Admitting: *Deleted

## 2019-06-06 ENCOUNTER — Telehealth: Payer: Self-pay | Admitting: Gastroenterology

## 2019-06-06 NOTE — Telephone Encounter (Signed)
Attempted to call pt back but no answer. Will try again.

## 2019-06-06 NOTE — Telephone Encounter (Signed)
Patient would like to speak with nurse was just at ED

## 2019-06-06 NOTE — Patient Outreach (Signed)
[facility]) Care Management  06/06/2019  Stacey Velazquez 1945/07/29 [id number]   Subjective:   Received voicemail message from Donneta Romberg, stated today's phone appointment with this RNCM was noted on her 06/05/2019 ED after visit summary, was following up, and requested call back.   States she was seen in the ED at [facility] for being unable to urinate, unable to have a bowel movement, several test performed, and discharge to home.    Telephone call to patient's home  number, no answer, no answering machine, no voicemail, and unable to leave a message.     Objective: Per KPN (Knowledge Performance Now, point of care tool) and chart review, patient hospitalized 05/10/2019 - 05/14/2019 for Adenomatous polyp of rectum status post partial proctectomy 05/09/2019.    Patient hospitalized 03/05/2019 - 03/06/2019 for rectal bleeding,Gastric ulcer,Rectal polyp,Adenomatous polyp of ascending colon, was[facility] hospital for preparation of EGD and colonoscopy on February 9 due to transportation issues.Patient also has a history ofanxiety, autoimmune hepatitis withsuspectedcirrhosis, bipolar 1 disorder, fibromyalgia, GERD, hypothyroidism, history of seizures last in 2016,Drug-induced Parkinsonism,Glaucoma,Heart attack,Pacemaker Medtronic,Paranoid schizophrenia,Polyarthritis, and stroke.     Assessment: Received Medicare EMMI General Discharge Red Flag Alert follow up referral on 05/21/2019 and 05/17/2019.    Red Flag Alert Trigger, Day#4,, Patient answered yes to the following questions: Sad/hopeless/anxious/empty?   Other questions/problems?   Red Flag Alert Trigger, Day #1, patient answered no to the following question: Scheduled follow-up?   EMMI follow up completed and will follow up to assess further care management needs.      Plan:RNCM will call patient for telephone outreach attempt, within 7  business days, EMMI follow up, to assess for  further CM needs, and proceed with case closure, within 10 business days if no return call.      Ashya Nicolaisen H. Annia Friendly, BSN, [place] [facility] Telephonic CM Phone: [phone] Fax: [phone]

## 2019-06-07 ENCOUNTER — Other Ambulatory Visit: Payer: Self-pay

## 2019-06-07 DIAGNOSIS — M79601 Pain in right arm: Secondary | ICD-10-CM | POA: Diagnosis not present

## 2019-06-07 DIAGNOSIS — Z483 Aftercare following surgery for neoplasm: Secondary | ICD-10-CM | POA: Diagnosis not present

## 2019-06-07 DIAGNOSIS — F419 Anxiety disorder, unspecified: Secondary | ICD-10-CM | POA: Diagnosis not present

## 2019-06-07 DIAGNOSIS — M797 Fibromyalgia: Secondary | ICD-10-CM | POA: Diagnosis not present

## 2019-06-07 DIAGNOSIS — G8929 Other chronic pain: Secondary | ICD-10-CM | POA: Diagnosis not present

## 2019-06-07 DIAGNOSIS — M150 Primary generalized (osteo)arthritis: Secondary | ICD-10-CM | POA: Diagnosis not present

## 2019-06-07 NOTE — Telephone Encounter (Signed)
Spoke with pt and she is aware and knows to keep her OV as scheduled.

## 2019-06-07 NOTE — Telephone Encounter (Signed)
I reviewed the records from the ED. The spleen is related to the liver and not a new findings. She should keep her appointment next week.  I hope that she will like her new doctor. Thanks for the update.

## 2019-06-07 NOTE — Telephone Encounter (Signed)
Pt wanted to let Dr. Tarri Glenn know she was seen in the er Tuesday. Stated she was not passing urine, constipated and could not eat. She was dx with enlarged spleen and urinary retention. She is scheduled for OV next week with Dr. Tarri Glenn. Pt was instructed to notify GI office to see if she needed to be seen sooner, she is followed for AIH. Pt also wanted to let Dr. Tarri Glenn know that her PCP has changed to Dr. Ethelene Hal at [facility] [address]Pts appt here is next week.

## 2019-06-07 NOTE — Progress Notes (Signed)
Updated instructions mailed to pt. 

## 2019-06-08 DIAGNOSIS — M79601 Pain in right arm: Secondary | ICD-10-CM | POA: Diagnosis not present

## 2019-06-08 DIAGNOSIS — F419 Anxiety disorder, unspecified: Secondary | ICD-10-CM | POA: Diagnosis not present

## 2019-06-08 DIAGNOSIS — G8929 Other chronic pain: Secondary | ICD-10-CM | POA: Diagnosis not present

## 2019-06-08 DIAGNOSIS — M797 Fibromyalgia: Secondary | ICD-10-CM | POA: Diagnosis not present

## 2019-06-08 DIAGNOSIS — Z483 Aftercare following surgery for neoplasm: Secondary | ICD-10-CM | POA: Diagnosis not present

## 2019-06-08 DIAGNOSIS — M150 Primary generalized (osteo)arthritis: Secondary | ICD-10-CM | POA: Diagnosis not present

## 2019-06-11 ENCOUNTER — Other Ambulatory Visit: Payer: Self-pay | Admitting: *Deleted

## 2019-06-11 ENCOUNTER — Ambulatory Visit: Payer: Medicare Other | Admitting: Podiatry

## 2019-06-11 DIAGNOSIS — M797 Fibromyalgia: Secondary | ICD-10-CM | POA: Diagnosis not present

## 2019-06-11 DIAGNOSIS — F419 Anxiety disorder, unspecified: Secondary | ICD-10-CM | POA: Diagnosis not present

## 2019-06-11 DIAGNOSIS — G8929 Other chronic pain: Secondary | ICD-10-CM | POA: Diagnosis not present

## 2019-06-11 DIAGNOSIS — M79601 Pain in right arm: Secondary | ICD-10-CM | POA: Diagnosis not present

## 2019-06-11 DIAGNOSIS — M150 Primary generalized (osteo)arthritis: Secondary | ICD-10-CM | POA: Diagnosis not present

## 2019-06-11 DIAGNOSIS — Z483 Aftercare following surgery for neoplasm: Secondary | ICD-10-CM | POA: Diagnosis not present

## 2019-06-11 NOTE — Patient Outreach (Signed)
[facility]) Care Management  06/11/2019  Stacey Velazquez March 30, 1945 [id number]   Subjective: Telephone call to patient's home number, spoke with patient, and HIPAA verified.  Patient states doing okay, laying down resting, and ok to speak with RNCM for a quick follow up.  States had ED visit on 06/05/2019 for urinary retention and constipation.   States she was diagnosed with an enlarged spleen, she is achy all over, has an appointment with gastroenterologist on 06/12/2019 to  address current symptoms.     States she had a follow up appointment primary provider on 06/04/2019, was seen by nurse practitioner,  and she was not pleased with visit.   States she had several concerns with the visit, has informed provider/ practice of her concerns, and advised them that she changed her primary to Dr. Abelino Derrick (  [place], [place],  [address], [phone]).  States her biggest concern from this visit was she was not informed of old primary MD (Dr. Rocco Serene retirement at [facility]) until she arrived for her appointment on 06/04/2019.   Patient has appointment with new primary MD on 06/18/2019.   States her 05/29/2019 follow up appointment with surgeon went well and next appointment is on 07/02/2019.  States she has a follow up appointment with urologist on 07/16/2019.  She continues to receive home health services ([facility] ) and services are going well.   Patient states her daughter, son-in-law, and granddaughter are currently in the hospital with Covid 19.   Patient states he does not have any education material, EMMI follow up, care coordination, care management, disease monitoring, transportation, community resource, or pharmacy needs at this time.   States everyone that she has encounter with [facility] has been excellent, she is very appreciative of the follow up,  is in agreement  to receive 1 additional follow up call to assess for further CM needs, and is in  agreement to receive [place] Management EMMI follow up calls as needed.       Objective:Per KPN (Knowledge Performance Now, point of care tool) and chart review,patient hospitalized 05/10/2019 - 05/14/2019 forAdenomatous polyp of rectum statuspostpartial proctectomy 05/09/2019. Patient hospitalized 03/05/2019 - 03/06/2019 for rectal bleeding,Gastric ulcer,Rectal polyp,Adenomatous polyp of ascending colon, wasadmitted to [facility] hospital for preparation of EGD and colonoscopy on February 9 due to transportation issues.Patient also has a history ofanxiety, autoimmune hepatitis withsuspectedcirrhosis, bipolar 1 disorder, fibromyalgia, GERD, hypothyroidism, history of seizures last in 2016,Drug-induced Parkinsonism,Glaucoma,Heart attack,Pacemaker Medtronic,Paranoid schizophrenia,Polyarthritis, and stroke.     Assessment: Received [place] General Discharge Red Flag Alert follow up referral on 05/21/2019 and 05/17/2019.Red Flag Alert Trigger, Day#4,, Patient answered yes to the following questions:Sad/hopeless/anxious/empty?Other questions/problems? Red Flag Alert Trigger, Day #1, patient answered no to the following question:Scheduled follow-up?EMMI follow up completed andwill follow up to assessfurther care management needs.      Plan:RNCM will call patient for telephone outreach attempt, within21business days, EMMI follow up, to assess for further CM needs, and proceed with case closure, within 10 business days if no return call, after 3rd unsuccessful outreach call.        Castella Lerner H. Annia Friendly, BSN, [place] Management [facility] Telephonic CM Phone: [phone] Fax: [phone]

## 2019-06-12 ENCOUNTER — Encounter: Payer: Self-pay | Admitting: Gastroenterology

## 2019-06-12 ENCOUNTER — Ambulatory Visit: Payer: Medicare Other | Admitting: Gastroenterology

## 2019-06-12 VITALS — BP 102/80 | HR 71 | Ht 69.0 in | Wt 211.0 lb

## 2019-06-13 DIAGNOSIS — F419 Anxiety disorder, unspecified: Secondary | ICD-10-CM | POA: Diagnosis not present

## 2019-06-13 DIAGNOSIS — M150 Primary generalized (osteo)arthritis: Secondary | ICD-10-CM | POA: Diagnosis not present

## 2019-06-13 DIAGNOSIS — M797 Fibromyalgia: Secondary | ICD-10-CM | POA: Diagnosis not present

## 2019-06-13 DIAGNOSIS — Z483 Aftercare following surgery for neoplasm: Secondary | ICD-10-CM | POA: Diagnosis not present

## 2019-06-13 DIAGNOSIS — G8929 Other chronic pain: Secondary | ICD-10-CM | POA: Diagnosis not present

## 2019-06-13 DIAGNOSIS — M79601 Pain in right arm: Secondary | ICD-10-CM | POA: Diagnosis not present

## 2019-06-13 NOTE — Progress Notes (Signed)
The patient rescheduled her appointment. No service provided 06/12/19. Can you please adjust Epic so that a clinical encounter note is no longer expected? Thank you.

## 2019-06-14 DIAGNOSIS — M150 Primary generalized (osteo)arthritis: Secondary | ICD-10-CM | POA: Diagnosis not present

## 2019-06-14 DIAGNOSIS — G8929 Other chronic pain: Secondary | ICD-10-CM | POA: Diagnosis not present

## 2019-06-14 DIAGNOSIS — M797 Fibromyalgia: Secondary | ICD-10-CM | POA: Diagnosis not present

## 2019-06-14 DIAGNOSIS — F419 Anxiety disorder, unspecified: Secondary | ICD-10-CM | POA: Diagnosis not present

## 2019-06-14 DIAGNOSIS — Z483 Aftercare following surgery for neoplasm: Secondary | ICD-10-CM | POA: Diagnosis not present

## 2019-06-14 DIAGNOSIS — M79601 Pain in right arm: Secondary | ICD-10-CM | POA: Diagnosis not present

## 2019-06-15 DIAGNOSIS — F419 Anxiety disorder, unspecified: Secondary | ICD-10-CM | POA: Diagnosis not present

## 2019-06-15 DIAGNOSIS — M797 Fibromyalgia: Secondary | ICD-10-CM | POA: Diagnosis not present

## 2019-06-15 DIAGNOSIS — Z483 Aftercare following surgery for neoplasm: Secondary | ICD-10-CM | POA: Diagnosis not present

## 2019-06-15 DIAGNOSIS — M79601 Pain in right arm: Secondary | ICD-10-CM | POA: Diagnosis not present

## 2019-06-15 DIAGNOSIS — M150 Primary generalized (osteo)arthritis: Secondary | ICD-10-CM | POA: Diagnosis not present

## 2019-06-15 DIAGNOSIS — G8929 Other chronic pain: Secondary | ICD-10-CM | POA: Diagnosis not present

## 2019-06-18 ENCOUNTER — Ambulatory Visit: Payer: Medicare Other | Admitting: Family Medicine

## 2019-06-19 DIAGNOSIS — G8929 Other chronic pain: Secondary | ICD-10-CM | POA: Diagnosis not present

## 2019-06-19 DIAGNOSIS — M79601 Pain in right arm: Secondary | ICD-10-CM | POA: Diagnosis not present

## 2019-06-19 DIAGNOSIS — M797 Fibromyalgia: Secondary | ICD-10-CM | POA: Diagnosis not present

## 2019-06-19 DIAGNOSIS — M150 Primary generalized (osteo)arthritis: Secondary | ICD-10-CM | POA: Diagnosis not present

## 2019-06-19 DIAGNOSIS — F419 Anxiety disorder, unspecified: Secondary | ICD-10-CM | POA: Diagnosis not present

## 2019-06-19 DIAGNOSIS — Z483 Aftercare following surgery for neoplasm: Secondary | ICD-10-CM | POA: Diagnosis not present

## 2019-06-20 DIAGNOSIS — M150 Primary generalized (osteo)arthritis: Secondary | ICD-10-CM | POA: Diagnosis not present

## 2019-06-20 DIAGNOSIS — G8929 Other chronic pain: Secondary | ICD-10-CM | POA: Diagnosis not present

## 2019-06-20 DIAGNOSIS — M79601 Pain in right arm: Secondary | ICD-10-CM | POA: Diagnosis not present

## 2019-06-20 DIAGNOSIS — Z483 Aftercare following surgery for neoplasm: Secondary | ICD-10-CM | POA: Diagnosis not present

## 2019-06-20 DIAGNOSIS — M797 Fibromyalgia: Secondary | ICD-10-CM | POA: Diagnosis not present

## 2019-06-20 DIAGNOSIS — F419 Anxiety disorder, unspecified: Secondary | ICD-10-CM | POA: Diagnosis not present

## 2019-06-21 ENCOUNTER — Other Ambulatory Visit (INDEPENDENT_AMBULATORY_CARE_PROVIDER_SITE_OTHER): Payer: Medicare Other

## 2019-06-21 DIAGNOSIS — R748 Abnormal levels of other serum enzymes: Secondary | ICD-10-CM

## 2019-06-21 DIAGNOSIS — K754 Autoimmune hepatitis: Secondary | ICD-10-CM

## 2019-06-21 DIAGNOSIS — K7469 Other cirrhosis of liver: Secondary | ICD-10-CM | POA: Diagnosis not present

## 2019-06-21 LAB — CBC WITH DIFFERENTIAL/PLATELET
Basophils Absolute: 0 10*3/uL (ref 0.0–0.1)
Basophils Relative: 0.4 % (ref 0.0–3.0)
Eosinophils Absolute: 0 10*3/uL (ref 0.0–0.7)
Eosinophils Relative: 1.2 % (ref 0.0–5.0)
HCT: 35.3 % — ABNORMAL LOW (ref 36.0–46.0)
Hemoglobin: 12 g/dL (ref 12.0–15.0)
Lymphocytes Relative: 33.3 % (ref 12.0–46.0)
Lymphs Abs: 0.5 10*3/uL — ABNORMAL LOW (ref 0.7–4.0)
MCHC: 34.1 g/dL (ref 30.0–36.0)
MCV: 96.7 fl (ref 78.0–100.0)
Monocytes Absolute: 0.2 10*3/uL (ref 0.1–1.0)
Monocytes Relative: 14.7 % — ABNORMAL HIGH (ref 3.0–12.0)
Neutro Abs: 0.8 10*3/uL — ABNORMAL LOW (ref 1.4–7.7)
Neutrophils Relative %: 50.4 % (ref 43.0–77.0)
Platelets: 90 10*3/uL — ABNORMAL LOW (ref 150.0–400.0)
RBC: 3.65 Mil/uL — ABNORMAL LOW (ref 3.87–5.11)
RDW: 19.2 % — ABNORMAL HIGH (ref 11.5–15.5)
WBC: 1.5 10*3/uL — CL (ref 4.0–10.5)

## 2019-06-21 LAB — COMPREHENSIVE METABOLIC PANEL
ALT: 19 U/L (ref 0–35)
AST: 32 U/L (ref 0–37)
Albumin: 2.9 g/dL — ABNORMAL LOW (ref 3.5–5.2)
Alkaline Phosphatase: 67 U/L (ref 39–117)
BUN: 11 mg/dL (ref 6–23)
CO2: 24 mEq/L (ref 19–32)
Calcium: 8.3 mg/dL — ABNORMAL LOW (ref 8.4–10.5)
Chloride: 106 mEq/L (ref 96–112)
Creatinine, Ser: 0.54 mg/dL (ref 0.40–1.20)
GFR: 110.06 mL/min (ref >60.00)
Glucose, Bld: 78 mg/dL (ref 70–99)
Potassium: 3.9 mEq/L (ref 3.5–5.1)
Sodium: 133 mEq/L — ABNORMAL LOW (ref 135–145)
Total Bilirubin: 0.8 mg/dL (ref 0.2–1.2)
Total Protein: 6.6 g/dL (ref 6.0–8.3)

## 2019-06-22 DIAGNOSIS — F419 Anxiety disorder, unspecified: Secondary | ICD-10-CM | POA: Diagnosis not present

## 2019-06-22 DIAGNOSIS — Z483 Aftercare following surgery for neoplasm: Secondary | ICD-10-CM | POA: Diagnosis not present

## 2019-06-22 DIAGNOSIS — M797 Fibromyalgia: Secondary | ICD-10-CM | POA: Diagnosis not present

## 2019-06-22 DIAGNOSIS — M150 Primary generalized (osteo)arthritis: Secondary | ICD-10-CM | POA: Diagnosis not present

## 2019-06-22 DIAGNOSIS — G8929 Other chronic pain: Secondary | ICD-10-CM | POA: Diagnosis not present

## 2019-06-22 DIAGNOSIS — M79601 Pain in right arm: Secondary | ICD-10-CM | POA: Diagnosis not present

## 2019-06-22 LAB — IGG: IgG (Immunoglobin G), Serum: 2620 mg/dL — ABNORMAL HIGH (ref 600–1540)

## 2019-06-25 DIAGNOSIS — K219 Gastro-esophageal reflux disease without esophagitis: Secondary | ICD-10-CM | POA: Diagnosis not present

## 2019-06-25 DIAGNOSIS — Z9049 Acquired absence of other specified parts of digestive tract: Secondary | ICD-10-CM | POA: Diagnosis not present

## 2019-06-25 DIAGNOSIS — Z6832 Body mass index (BMI) 32.0-32.9, adult: Secondary | ICD-10-CM | POA: Diagnosis not present

## 2019-06-25 DIAGNOSIS — E6609 Other obesity due to excess calories: Secondary | ICD-10-CM | POA: Diagnosis not present

## 2019-06-25 DIAGNOSIS — Z87891 Personal history of nicotine dependence: Secondary | ICD-10-CM | POA: Diagnosis not present

## 2019-06-25 DIAGNOSIS — M79601 Pain in right arm: Secondary | ICD-10-CM | POA: Diagnosis not present

## 2019-06-25 DIAGNOSIS — G2119 Other drug induced secondary parkinsonism: Secondary | ICD-10-CM | POA: Diagnosis not present

## 2019-06-25 DIAGNOSIS — K754 Autoimmune hepatitis: Secondary | ICD-10-CM | POA: Diagnosis not present

## 2019-06-25 DIAGNOSIS — M25321 Other instability, right elbow: Secondary | ICD-10-CM | POA: Diagnosis not present

## 2019-06-25 DIAGNOSIS — R42 Dizziness and giddiness: Secondary | ICD-10-CM | POA: Diagnosis not present

## 2019-06-25 DIAGNOSIS — G40909 Epilepsy, unspecified, not intractable, without status epilepticus: Secondary | ICD-10-CM | POA: Diagnosis not present

## 2019-06-25 DIAGNOSIS — M797 Fibromyalgia: Secondary | ICD-10-CM | POA: Diagnosis not present

## 2019-06-25 DIAGNOSIS — D6959 Other secondary thrombocytopenia: Secondary | ICD-10-CM | POA: Diagnosis not present

## 2019-06-25 DIAGNOSIS — F319 Bipolar disorder, unspecified: Secondary | ICD-10-CM | POA: Diagnosis not present

## 2019-06-25 DIAGNOSIS — F200 Paranoid schizophrenia: Secondary | ICD-10-CM | POA: Diagnosis not present

## 2019-06-25 DIAGNOSIS — M150 Primary generalized (osteo)arthritis: Secondary | ICD-10-CM | POA: Diagnosis not present

## 2019-06-25 DIAGNOSIS — I519 Heart disease, unspecified: Secondary | ICD-10-CM | POA: Diagnosis not present

## 2019-06-25 DIAGNOSIS — G8929 Other chronic pain: Secondary | ICD-10-CM | POA: Diagnosis not present

## 2019-06-25 DIAGNOSIS — H409 Unspecified glaucoma: Secondary | ICD-10-CM | POA: Diagnosis not present

## 2019-06-25 DIAGNOSIS — K7460 Unspecified cirrhosis of liver: Secondary | ICD-10-CM | POA: Diagnosis not present

## 2019-06-25 DIAGNOSIS — Z483 Aftercare following surgery for neoplasm: Secondary | ICD-10-CM | POA: Diagnosis not present

## 2019-06-25 DIAGNOSIS — E039 Hypothyroidism, unspecified: Secondary | ICD-10-CM | POA: Diagnosis not present

## 2019-06-25 DIAGNOSIS — Z950 Presence of cardiac pacemaker: Secondary | ICD-10-CM | POA: Diagnosis not present

## 2019-06-25 DIAGNOSIS — F419 Anxiety disorder, unspecified: Secondary | ICD-10-CM | POA: Diagnosis not present

## 2019-06-25 DIAGNOSIS — D702 Other drug-induced agranulocytosis: Secondary | ICD-10-CM | POA: Diagnosis not present

## 2019-06-27 ENCOUNTER — Other Ambulatory Visit: Payer: Self-pay | Admitting: *Deleted

## 2019-06-27 NOTE — Patient Outreach (Signed)
[facility]) Care Management  06/27/2019  Stacey Velazquez 11/21/1945 [id number]   Subjective: Telephone call to patient's home number, spoke with patient, and HIPAA verified.  Patient states she is tired, going to bed, and requested call back at a later time.     Objective:Per KPN (Knowledge Performance Now, point of care tool) and chart review,patient hospitalized 05/10/2019 - 05/14/2019 forAdenomatous polyp of rectum statuspostpartial proctectomy 05/09/2019. Patient hospitalized 03/05/2019 - 03/06/2019 for rectal bleeding,Gastric ulcer,Rectal polyp,Adenomatous polyp of ascending colon, wasadmitted to [facility] hospital for preparation of EGD and colonoscopy on February 9 due to transportation issues.Patient also has a history ofanxiety, autoimmune hepatitis withsuspectedcirrhosis, bipolar 1 disorder, fibromyalgia, GERD, hypothyroidism, history of seizures last in 2016,Drug-induced Parkinsonism,Glaucoma,Heart attack,Pacemaker Medtronic,Paranoid schizophrenia,Polyarthritis, and stroke.     Assessment: Received [place] General Discharge Red Flag Alert follow up referral on 05/21/2019 and 05/17/2019.Red Flag Alert Trigger, Day#4,, Patient answered yes to the following questions:Sad/hopeless/anxious/empty?Other questions/problems? Red Flag Alert Trigger, Day #1, patient answered no to the following question:Scheduled follow-up?EMMI follow up completed andwill follow up to assessfurther care management needs.      Plan:RNCM will call patient for 2nd telephone outreach attempt, within7business days, EMMI follow up, to assess for further CM needs, and proceed with case closure, within 10 business days if no return call, after 4th unsuccessful outreach call.        Keddrick Wyne H. Annia Friendly, BSN, [place] Management [facility] Telephonic CM Phone: [phone] Fax: [phone]

## 2019-07-02 ENCOUNTER — Other Ambulatory Visit: Payer: Self-pay

## 2019-07-03 ENCOUNTER — Encounter: Payer: Self-pay | Admitting: Family Medicine

## 2019-07-03 ENCOUNTER — Ambulatory Visit (INDEPENDENT_AMBULATORY_CARE_PROVIDER_SITE_OTHER): Payer: Medicare Other | Admitting: Family Medicine

## 2019-07-03 VITALS — BP 116/78 | HR 70 | Temp 97.6°F

## 2019-07-03 DIAGNOSIS — F200 Paranoid schizophrenia: Secondary | ICD-10-CM | POA: Diagnosis not present

## 2019-07-03 DIAGNOSIS — K766 Portal hypertension: Secondary | ICD-10-CM | POA: Diagnosis not present

## 2019-07-03 DIAGNOSIS — E039 Hypothyroidism, unspecified: Secondary | ICD-10-CM | POA: Diagnosis not present

## 2019-07-03 DIAGNOSIS — I8500 Esophageal varices without bleeding: Secondary | ICD-10-CM | POA: Diagnosis not present

## 2019-07-03 DIAGNOSIS — F319 Bipolar disorder, unspecified: Secondary | ICD-10-CM | POA: Diagnosis not present

## 2019-07-03 NOTE — Progress Notes (Signed)
New Patient Office Visit  Subjective:  Patient ID: Stacey Velazquez, female    DOB: 1945/07/28  Age: 74 y.o. MRN: [id number]  CC:  Chief Complaint  Patient presents with  . New Patient (Initial Visit)    Patient is here today to establish care as a new patient. She would like to discuss the Covid Vaccine and if she needs it in respect to her conditions. She was just released from Dr. Johney Maine at [facility] yesterday after removing a growth. She still has her stitches and the bleeding has stopped. She had skin CA removed on her face by Dermatology and has more that needs to be removed by them. She has a GI.  She sees [facility] on the 21st Dr. Louis Meckel. Gets PT & OT at home.    HPI KOYA HUNGER presents for establishment of care.  Extensive past medical history to include autoimmune hepatitis w associated cirrhosis portal hypertension tension and esophageal varices.,  Heart disease.  History of paranoid schizophrenia with bipolar disease.  History of hypothyroidism.  Currently taking Imuran 100 mg daily, Synthroid 50 mcg that she takes daily first thing in the morning before eating, and omeprazole.  She has refused psychiatric care in the past because she does not feel as though she needs it.  Blood work review does confirm leukopenia and thrombocytopenia thought to be secondary to azathioprine.  CMP shows normal AST, ALT and GFR.  Patient lives alone and is able to accomplish all of her ADLs.  She has a friend, Arbie Cookey, who checks on her regularly.  She has no known family.  She does not smoke, drink alcohol or use illicit drugs.  She is partially ambulatory, uses a walker or wheelchair.  She is currently working with OT and PT  Past Medical History:  Diagnosis Date  . Adenomatous polyp of ascending colon   . Anxiety   . Bilateral lower extremity edema    burning sensations  . Bipolar 1 disorder ([place])   . Carpal tunnel syndrome   . Cirrhosis of liver ([place])   . Cutaneous horn    . Drug-induced Parkinsonism ([place])    patient unaware  . Esophageal varices ([place])   . Fibromyalgia    patient denies  . Gastric ulcer   . GERD (gastroesophageal reflux disease)   . Gingival abscess 08/26/2016  . Glaucoma 2003   pt unaware  . Heart attack ([place])   . Heart disease   . Hepatitis, autoimmune ([place]) 08/25/2014  . History of iron deficiency anemia   . Hypotension   . Hypothyroidism   . Migraines   . Neutropenia ([place])   . Osteoporosis   . Pacemaker Medtronic    MRI compatible  . Paranoid schizophrenia ([place])   . Polyarthritis   . Polyosteoarthritis   . Rectal mass   . Seizures ([place])    last sz 08/21/14  . Sinus arrest   . Skin cancer    forehead  . SSS (sick sinus syndrome) ([place])   . Stroke ([place])   . Thrombocytopenia ([place])   . Vertigo    bvvp    Past Surgical History:  Procedure Laterality Date  . BIOPSY  03/06/2019   Procedure: BIOPSY;  Surgeon: Thornton Park, MD;  Location: [facility];  Service: Gastroenterology;;  EGD and COLON  . CATARACT EXTRACTION Left 2008   pt unaware  . CHOLECYSTECTOMY    . COLONOSCOPY WITH PROPOFOL N/A 03/06/2019   Procedure: COLONOSCOPY WITH PROPOFOL;  Surgeon:  Thornton Park, MD;  Location: [facility];  Service: Gastroenterology;  Laterality: N/A;  . ESOPHAGOGASTRODUODENOSCOPY (EGD) WITH PROPOFOL N/A 03/06/2019   Procedure: ESOPHAGOGASTRODUODENOSCOPY (EGD) WITH PROPOFOL ;  Surgeon: Thornton Park, MD;  [facility];  Service: Gastroenterology;  Laterality: N/A;  . KNEE SURGERY Left 1985  . PACEMAKER INSERTION  2014  . PARTIAL HYSTERECTOMY  1979  . PARTIAL PROCTECTOMY BY TEM N/A 05/10/2019   Procedure: TEM PARTIAL PROCTECTOMY OF RECTAL MASS WITH EXCISION RIGHT PERINEAL SKIN MASS;  Surgeon: Michael Boston, MD;  Location: [facility];  Service: General;  Laterality: N/A;  . POLYPECTOMY  03/06/2019   Procedure: POLYPECTOMY;  Surgeon: Thornton Park, MD;  [facility];  Service: Gastroenterology;;  . REFRACTIVE  SURGERY    . TUBAL LIGATION      Family History  Problem Relation Age of Onset  . Heart attack Father   . Heart disease Paternal Uncle   . Heart disease Paternal Grandmother   . Diabetes Paternal Grandmother   . Heart disease Paternal Grandfather   . Breast cancer Mother   . Cancer Maternal Grandmother   . CVA Maternal Grandmother   . Cancer Maternal Grandfather   . CVA Maternal Grandfather   . Diabetes Sister   . Diabetes Brother   . Diabetes Brother   . Stroke Daughter     Social History   Socioeconomic History  . Marital status: Widowed    Spouse name: Not on file  . Number of children: Not on file  . Years of education: Not on file  . Highest education level: Not on file  Occupational History  . Not on file  Tobacco Use  . Smoking status: Former Smoker    Quit date: 03/02/2003    Years since quitting: 16.3  . Smokeless tobacco: Never Used  Substance and Sexual Activity  . Alcohol use: No  . Drug use: No  . Sexual activity: Not on file  Other Topics Concern  . Not on file  Social History Narrative   Lives alone, "have friends and relatives that come and help me"   caffeine use- coffee -1 cup daily   Social Determinants of Health   Financial Resource Strain: Low Risk   . Difficulty of Paying Living Expenses: Not very hard  Food Insecurity: No Food Insecurity  . Worried About Charity fundraiser in the Last Year: Never true  . Ran Out of Food in the Last Year: Never true  Transportation Needs: Unmet Transportation Needs  . Lack of Transportation (Medical): Yes  . Lack of Transportation (Non-Medical): Yes  Physical Activity: Inactive  . Days of Exercise per Week: 0 days  . Minutes of Exercise per Session: 0 min  Stress: Stress Concern Present  . Feeling of Stress : To some extent  Social Connections: Somewhat Isolated  . Frequency of Communication with Friends and Family: More than three times a week  . Frequency of Social Gatherings with Friends and  Family: More than three times a week  . Attends Religious Services: 1 to 4 times per year  . Active Member of Clubs or Organizations: No  . Attends Archivist Meetings: Never  . Marital Status: Widowed  Intimate Partner Violence: Not At Risk  . Fear of Current or Ex-Partner: No  . Emotionally Abused: No  . Physically Abused: No  . Sexually Abused: No    ROS Review of Systems  Constitutional: Negative.   Respiratory: Negative.   Cardiovascular: Negative.   Gastrointestinal: Negative.  Genitourinary: Negative.   Musculoskeletal: Positive for gait problem.  Allergic/Immunologic: Positive for immunocompromised state.  Neurological: Negative for tremors and speech difficulty.    Objective:   Today's Vitals: BP 116/78 (BP Location: Right Arm, Patient Position: Sitting, Cuff Size: Normal)   Pulse 70   Temp 97.6 F (36.4 C) (Temporal)   SpO2 100%   Physical Exam Vitals and nursing note reviewed.  Constitutional:      General: She is not in acute distress.    Appearance: Normal appearance. She is obese. She is not ill-appearing, toxic-appearing or diaphoretic.  HENT:     Head: Normocephalic and atraumatic.     Right Ear: External ear normal.     Left Ear: External ear normal.  Eyes:     General: No scleral icterus.       Right eye: No discharge.        Left eye: No discharge.     Extraocular Movements: Extraocular movements intact.     Conjunctiva/sclera: Conjunctivae normal.  Cardiovascular:     Rate and Rhythm: Normal rate and regular rhythm.  Pulmonary:     Effort: Pulmonary effort is normal.     Breath sounds: Normal breath sounds.  Skin:    General: Skin is warm and dry.  Neurological:     Mental Status: She is alert and oriented to person, place, and time.  Psychiatric:        Mood and Affect: Mood normal.        Behavior: Behavior normal.     Assessment & Plan:   Problem List Items Addressed This Visit      Cardiovascular and Mediastinum    Esophageal varices determined by endoscopy ([place]) - Primary     Endocrine   Hypothyroidism (Chronic)   Relevant Orders   TSH     Other   Bipolar 1 disorder (HCC) (Chronic)   Relevant Orders   Ambulatory referral to Psychiatry   Paranoid schizophrenia [facility])   Relevant Orders   Ambulatory referral to Psychiatry    Other Visit Diagnoses    Portal hypertension ([place])          Outpatient Encounter Medications as of 07/03/2019  Medication Sig  . azathioprine (IMURAN) 100 MG tablet Take 1 tablet (100 mg total) by mouth daily. (Patient taking differently: Take 100 mg by mouth daily in the afternoon. )  . levothyroxine (SYNTHROID) 50 MCG tablet Take 50 mcg by mouth daily before breakfast.  . omeprazole (PRILOSEC) 20 MG capsule Take 40 mg by mouth daily before breakfast.    No facility-administered encounter medications on file as of 07/03/2019.    Follow-up: Return in about 3 months (around 10/03/2019).  Encouraged her to give psychiatry another try.  We will follow her every 3 months and she tells me that she believes that she can make that.  Libby Maw, MD

## 2019-07-04 ENCOUNTER — Other Ambulatory Visit: Payer: Self-pay | Admitting: *Deleted

## 2019-07-04 LAB — TSH: TSH: 19.72 u[IU]/mL — ABNORMAL HIGH (ref 0.35–4.50)

## 2019-07-04 NOTE — Patient Outreach (Addendum)
[facility]) Care Management  07/04/2019  Stacey Velazquez 1945/03/15 [id number]   Subjective: Telephone call to patient's home number, spoke with patient, and HIPAA verified.  Patient states she is doing much better and things are going well.  States she has a Passenger transport manager, is very appreciative of everything that has been done for her, and feels that she has received the best care.  States she has received fantastic care, tells everyone she knows how great it is, and where to go get it.   States everything has fallen into place, has received so many blessings, and also received her wheel chair accessible transportation through [facility].  States she had follow appointment with surgeon on 07/02/2019 and MD states everything is healing well.   She is having good bowel movements and tolerating the diet without difficulty.  States her home health therapists have taught her how to vacuum, cook, and clean while using her walker with a seat. States she had physical therapy earlier today and the visit went great. States has also learned how to pace activities of daily living and home management task, take rest breaks as needed, divides activities into smaller tasks as needed.  States she has learned new pain management techniques by using using distractions such as listening to music, watch her favorite movies on DVD, and positive reinforcements.    Patient states she had a follow up appointment with her new primary MD on 07/03/2019, the visit went great, and she really likes her new provider.    New provider ordered labs, blood was drawn,  and will be evaluating her thyroid level.  States she will have surgery at dermatologist office on 07/10/2019 to remove remaining skin cancer on forehead and has already arranged transportation with [facility] transportation.  Patient states she does not have any education material, EMMI follow up, care coordination, care management, disease monitoring,  transportation, community resource, or pharmacy needs at this time.  Patient is aware 30 day EMMI automated call follow up is completed.   States she is very appreciative of the follow up and has no additional care management needs at this time.     Objective:Per KPN (Knowledge Performance Now, point of care tool) and chart review,patient hospitalized 05/10/2019 - 05/14/2019 forAdenomatous polyp of rectum statuspostpartial proctectomy 05/09/2019. Patient hospitalized 03/05/2019 - 03/06/2019 for rectal bleeding,Gastric ulcer,Rectal polyp,Adenomatous polyp of ascending colon, [facility]GD and colonoscopy on February 9 due to transportation issues.Patient also has a history ofanxiety, autoimmune hepatitis withsuspectedcirrhosis, bipolar 1 disorder, fibromyalgia, GERD, hypothyroidism, history of seizures last in 2016,Drug-induced Parkinsonism,Glaucoma,Heart attack,Pacemaker Medtronic,Paranoid schizophrenia,Polyarthritis, and stroke.     Assessment: Received [facility] Discharge Red Flag Alert follow up referral on 05/21/2019 and 05/17/2019.Red Flag Alert Trigger, Day#4,, Patient answered yes to the following questions:Sad/hopeless/anxious/empty?Other questions/problems? Red Flag Alert Trigger, Day #1, patient answered no to the following question:Scheduled follow-up?EMMI follow up completed and no further care management needs.     Plan:RNCM will complete case closure due to follow up completed / no care management needs.       Stacey Velazquez, BSN, [place] [facility] Telephonic CM Phone: [phone] Fax: [phone]

## 2019-07-09 ENCOUNTER — Telehealth: Payer: Self-pay

## 2019-07-09 NOTE — Telephone Encounter (Signed)
I have tried to call this pt/Dr. [name] wants to have a phone visit with her to discuss labs and is ok with working her in somewhere that is convenient for her/plz try to help with this/thx dmf

## 2019-07-10 DIAGNOSIS — D0439 Carcinoma in situ of skin of other parts of face: Secondary | ICD-10-CM | POA: Diagnosis not present

## 2019-07-16 ENCOUNTER — Telehealth: Payer: Self-pay | Admitting: Gastroenterology

## 2019-07-16 DIAGNOSIS — R3911 Hesitancy of micturition: Secondary | ICD-10-CM | POA: Diagnosis not present

## 2019-07-16 DIAGNOSIS — R350 Frequency of micturition: Secondary | ICD-10-CM | POA: Diagnosis not present

## 2019-07-17 ENCOUNTER — Encounter: Payer: Self-pay | Admitting: Family Medicine

## 2019-07-17 ENCOUNTER — Telehealth (INDEPENDENT_AMBULATORY_CARE_PROVIDER_SITE_OTHER): Payer: Medicare Other | Admitting: Family Medicine

## 2019-07-17 VITALS — BP 122/78 | HR 70 | Temp 98.8°F

## 2019-07-17 DIAGNOSIS — E039 Hypothyroidism, unspecified: Secondary | ICD-10-CM | POA: Diagnosis not present

## 2019-07-17 MED ORDER — LEVOTHYROXINE SODIUM 75 MCG PO TABS: 75.0000 ug | ORAL_TABLET | Freq: Every day | ORAL | 0 refills | Status: DC

## 2019-07-17 NOTE — Progress Notes (Signed)
Established Patient Office Visit  Subjective:  Patient ID: Stacey Velazquez, female    DOB: September 27, 1945  Age: 74 y.o. MRN: [id number]  CC:  Chief Complaint  Patient presents with  . Follow-up    discuss labs    HPI CYDNE GRAHN presents for fu of her hypothyroidism. Feels as though she is consuming adequate nutrition. Has a friend who comes by and brings her groceries and has been working with a PT. She has been consuming ensure.  Assures me that she has been taking her thyroid medicine daily on a fasting stomach one hour before eating.  Past Medical History:  Diagnosis Date  . Adenomatous polyp of ascending colon   . Anxiety   . Bilateral lower extremity edema    burning sensations  . Bipolar 1 disorder ([place])   . Carpal tunnel syndrome   . Cirrhosis of liver ([place])   . Cutaneous horn   . Drug-induced Parkinsonism ([place])    patient unaware  . Esophageal varices ([place])   . Fibromyalgia    patient denies  . Gastric ulcer   . GERD (gastroesophageal reflux disease)   . Gingival abscess 08/26/2016  . Glaucoma 2003   pt unaware  . Heart attack ([place])   . Heart disease   . Hepatitis, autoimmune ([place]) 08/25/2014  . History of iron deficiency anemia   . Hypotension   . Hypothyroidism   . Migraines   . Neutropenia ([place])   . Osteoporosis   . Pacemaker Medtronic    MRI compatible  . Paranoid schizophrenia ([place])   . Polyarthritis   . Polyosteoarthritis   . Rectal mass   . Seizures ([place])    last sz 08/21/14  . Sinus arrest   . Skin cancer    forehead  . SSS (sick sinus syndrome) ([place])   . Stroke ([place])   . Thrombocytopenia ([place])   . Vertigo    bvvp    Past Surgical History:  Procedure Laterality Date  . BIOPSY  03/06/2019   Procedure: BIOPSY;  Surgeon: Thornton Park, MD;  [facility];  Service: Gastroenterology;;  EGD and COLON  . CATARACT EXTRACTION Left 2008   pt unaware  . CHOLECYSTECTOMY    . COLONOSCOPY WITH PROPOFOL N/A 03/06/2019   Procedure:  COLONOSCOPY WITH PROPOFOL;  Surgeon: Thornton Park, MD;  [facility];  Service: Gastroenterology;  Laterality: N/A;  . ESOPHAGOGASTRODUODENOSCOPY (EGD) WITH PROPOFOL N/A 03/06/2019   Procedure: ESOPHAGOGASTRODUODENOSCOPY (EGD) WITH PROPOFOL ;  Surgeon: Thornton Park, MD;  [facility];  Service: Gastroenterology;  Laterality: N/A;  . KNEE SURGERY Left 1985  . PACEMAKER INSERTION  2014  . PARTIAL HYSTERECTOMY  1979  . PARTIAL PROCTECTOMY BY TEM N/A 05/10/2019   Procedure: TEM PARTIAL PROCTECTOMY OF RECTAL MASS WITH EXCISION RIGHT PERINEAL SKIN MASS;  Surgeon: Michael Boston, MD;  [facility];  Service: General;  Laterality: N/A;  . POLYPECTOMY  03/06/2019   Procedure: POLYPECTOMY;  Surgeon: Thornton Park, MD;  [facility];  Service: Gastroenterology;;  . REFRACTIVE SURGERY    . TUBAL LIGATION      Family History  Problem Relation Age of Onset  . Heart attack Father   . Heart disease Paternal Uncle   . Heart disease Paternal Grandmother   . Diabetes Paternal Grandmother   . Heart disease Paternal Grandfather   . Breast cancer Mother   . Cancer Maternal Grandmother   . CVA Maternal Grandmother   . Cancer Maternal Grandfather   .  CVA Maternal Grandfather   . Diabetes Sister   . Diabetes Brother   . Diabetes Brother   . Stroke Daughter     Social History   Socioeconomic History  . Marital status: Widowed    Spouse name: Not on file  . Number of children: Not on file  . Years of education: Not on file  . Highest education level: Not on file  Occupational History  . Not on file  Tobacco Use  . Smoking status: Former Smoker    Quit date: 03/02/2003    Years since quitting: 16.3  . Smokeless tobacco: Never Used  Vaping Use  . Vaping Use: Never used  Substance and Sexual Activity  . Alcohol use: No  . Drug use: No  . Sexual activity: Not on file  Other Topics Concern  . Not on file  Social History Narrative   Lives alone, "have  friends and relatives that come and help me"   caffeine use- coffee -1 cup daily   Social Determinants of Health   Financial Resource Strain: Low Risk   . Difficulty of Paying Living Expenses: Not very hard  Food Insecurity: No Food Insecurity  . Worried About Charity fundraiser in the Last Year: Never true  . Ran Out of Food in the Last Year: Never true  Transportation Needs: Unmet Transportation Needs  . Lack of Transportation (Medical): Yes  . Lack of Transportation (Non-Medical): Yes  Physical Activity: Inactive  . Days of Exercise per Week: 0 days  . Minutes of Exercise per Session: 0 min  Stress: Stress Concern Present  . Feeling of Stress : To some extent  Social Connections: Moderately Isolated  . Frequency of Communication with Friends and Family: More than three times a week  . Frequency of Social Gatherings with Friends and Family: More than three times a week  . Attends Religious Services: 1 to 4 times per year  . Active Member of Clubs or Organizations: No  . Attends Archivist Meetings: Never  . Marital Status: Widowed  Intimate Partner Violence: Not At Risk  . Fear of Current or Ex-Partner: No  . Emotionally Abused: No  . Physically Abused: No  . Sexually Abused: No    Outpatient Medications Prior to Visit  Medication Sig Dispense Refill  . azathioprine (IMURAN) 100 MG tablet Take 1 tablet (100 mg total) by mouth daily. (Patient taking differently: Take 100 mg by mouth daily in the afternoon. ) 30 tablet 3  . omeprazole (PRILOSEC) 20 MG capsule Take 40 mg by mouth daily before breakfast.     . tamsulosin (FLOMAX) 0.4 MG CAPS capsule Take 0.4 mg by mouth daily.    Marland Kitchen levothyroxine (SYNTHROID) 50 MCG tablet Take 50 mcg by mouth daily before breakfast.     No facility-administered medications prior to visit.    Allergies  Allergen Reactions  . Neomycin Nausea And Vomiting  . Ambien [Zolpidem Tartrate] Other (See Comments)    Hallucinations   .  Amitriptyline Other (See Comments)    Stops heart per pt  . Ampicillin Other (See Comments)    Stops heart per pt   . Anaprox [Naproxen Sodium] Other (See Comments)    Doesn't work per pt  . Benadryl [Diphenhydramine Hcl (Sleep)] Other (See Comments)    Rash  . Cetirizine & Related Other (See Comments)    Swelling   . Cortizone-10 [Hydrocortisone] Other (See Comments)    seizures   . Darvon [Propoxyphene] Other (See  Comments)    Increased heart rate per pt   . Diazepam Other (See Comments)    Stops heart per pt  . Diflunisal Swelling and Other (See Comments)    Stops heart per pt (Dolobid)  . Duloxetine Other (See Comments)    Reaction to Cymbalta - pt doesn't remember what the reaction was  . Flexeril [Cyclobenzaprine] Other (See Comments)    Seizures   . Lactose Intolerance (Gi) Swelling and Other (See Comments)    cramping  . Lidocaine Other (See Comments)    Seizures  . Meperidine And Related Other (See Comments)    Stops heart rate (reaction to Demerol)  . Metanx [L-Methylfolate-Algae-B12-B6] Other (See Comments)    Pt does not remember reaction  . Metoclopramide Other (See Comments)    Pt does not remember reaction  . Morphine And Related Other (See Comments)    Stops heart per pt   . Nuprin [Ibuprofen] Other (See Comments)    Caused headache  . Oxycodone Other (See Comments)    Hallucinations   . Penicillins Other (See Comments)    Stops heart per pt   . Propoxyphene Other (See Comments)    Slowed heart rate per pt (reaction to Darvocet)  . Ranitidine Other (See Comments)    Pt does not remember reaction  . Vicodin [Hydrocodone-Acetaminophen] Other (See Comments)    Seizures   . Tylenol [Acetaminophen] Rash    ROS Review of Systems    Objective:    Physical Exam  BP 122/78   Pulse 70   Temp 98.8 F (37.1 C) (Tympanic)  Wt Readings from Last 3 Encounters:  06/12/19 211 lb (95.7 kg)  06/05/19 215 lb (97.5 kg)  05/14/19 240 lb 11.9 oz  (109.2 kg)     Health Maintenance Due  Topic Date Due  . Hepatitis C Screening  Never done  . COVID-19 Vaccine (1) Never done  . DEXA SCAN  Never done  . PNA vac Low Risk Adult (2 of 2 - PPSV23) 04/19/2018    There are no preventive care reminders to display for this patient.  Lab Results  Component Value Date   TSH 19.72 (H) 07/03/2019   Lab Results  Component Value Date   WBC 1.5 Repeated and verified X2. (LL) 06/21/2019   HGB 12.0 06/21/2019   HCT 35.3 (L) 06/21/2019   MCV 96.7 06/21/2019   PLT 90.0 (L) 06/21/2019   Lab Results  Component Value Date   NA 133 (L) 06/21/2019   K 3.9 06/21/2019   CO2 24 06/21/2019   GLUCOSE 78 06/21/2019   BUN 11 06/21/2019   CREATININE 0.54 06/21/2019   BILITOT 0.8 06/21/2019   ALKPHOS 67 06/21/2019   AST 32 06/21/2019   ALT 19 06/21/2019   PROT 6.6 06/21/2019   ALBUMIN 2.9 (L) 06/21/2019   CALCIUM 8.3 (L) 06/21/2019   ANIONGAP 6 06/05/2019   GFR 110.06 06/21/2019   No results found for: CHOL No results found for: HDL No results found for: LDLCALC No results found for: TRIG No results found for: CHOLHDL Lab Results  Component Value Date   HGBA1C 5.1 05/08/2019      Assessment & Plan:   Problem List Items Addressed This Visit      Endocrine   Hypothyroidism - Primary (Chronic)   Relevant Medications   levothyroxine (SYNTHROID) 75 MCG tablet      Meds ordered this encounter  Medications  . levothyroxine (SYNTHROID) 75 MCG tablet    Sig: Take  1 tablet (75 mcg total) by mouth daily before breakfast.    Dispense:  90 tablet    Refill:  0    Follow-up: Return in about 3 months (around 10/03/2019).   Already has an appointment on the 9/8.  Libby Maw, MD

## 2019-07-17 NOTE — Telephone Encounter (Signed)
Spoke with Crystal, she wanted to know if Dr. Tarri Glenn was the pts PCP. Discussed with her that she is GI and not the PCP. We are to disregard the form that was faxed as Dr. Tarri Glenn is not the PCP.

## 2019-07-19 ENCOUNTER — Other Ambulatory Visit (INDEPENDENT_AMBULATORY_CARE_PROVIDER_SITE_OTHER): Payer: Medicare Other

## 2019-07-19 ENCOUNTER — Telehealth: Payer: Self-pay | Admitting: *Deleted

## 2019-07-19 ENCOUNTER — Encounter: Payer: Self-pay | Admitting: Gastroenterology

## 2019-07-19 ENCOUNTER — Ambulatory Visit (INDEPENDENT_AMBULATORY_CARE_PROVIDER_SITE_OTHER): Payer: Medicare Other | Admitting: Gastroenterology

## 2019-07-19 VITALS — BP 120/70 | HR 69 | Ht 69.0 in | Wt 211.0 lb

## 2019-07-19 DIAGNOSIS — K754 Autoimmune hepatitis: Secondary | ICD-10-CM | POA: Diagnosis not present

## 2019-07-19 DIAGNOSIS — K621 Rectal polyp: Secondary | ICD-10-CM | POA: Diagnosis not present

## 2019-07-19 DIAGNOSIS — R748 Abnormal levels of other serum enzymes: Secondary | ICD-10-CM | POA: Diagnosis not present

## 2019-07-19 DIAGNOSIS — K259 Gastric ulcer, unspecified as acute or chronic, without hemorrhage or perforation: Secondary | ICD-10-CM

## 2019-07-19 DIAGNOSIS — K625 Hemorrhage of anus and rectum: Secondary | ICD-10-CM | POA: Diagnosis not present

## 2019-07-19 DIAGNOSIS — K7469 Other cirrhosis of liver: Secondary | ICD-10-CM

## 2019-07-19 DIAGNOSIS — I8500 Esophageal varices without bleeding: Secondary | ICD-10-CM

## 2019-07-19 LAB — COMPREHENSIVE METABOLIC PANEL
ALT: 17 U/L (ref 0–35)
AST: 30 U/L (ref 0–37)
Albumin: 2.9 g/dL — ABNORMAL LOW (ref 3.5–5.2)
Alkaline Phosphatase: 65 U/L (ref 39–117)
BUN: 11 mg/dL (ref 6–23)
CO2: 25 mEq/L (ref 19–32)
Calcium: 8.4 mg/dL (ref 8.4–10.5)
Chloride: 105 mEq/L (ref 96–112)
Creatinine, Ser: 0.54 mg/dL (ref 0.40–1.20)
GFR: 110.04 mL/min (ref >60.00)
Glucose, Bld: 83 mg/dL (ref 70–99)
Potassium: 4 mEq/L (ref 3.5–5.1)
Sodium: 134 mEq/L — ABNORMAL LOW (ref 135–145)
Total Bilirubin: 1 mg/dL (ref 0.2–1.2)
Total Protein: 6.6 g/dL (ref 6.0–8.3)

## 2019-07-19 LAB — CBC WITH DIFFERENTIAL/PLATELET
Basophils Absolute: 0 10*3/uL (ref 0.0–0.1)
Basophils Relative: 0.9 % (ref 0.0–3.0)
Eosinophils Absolute: 0 10*3/uL (ref 0.0–0.7)
Eosinophils Relative: 0.9 % (ref 0.0–5.0)
HCT: 34.6 % — ABNORMAL LOW (ref 36.0–46.0)
Hemoglobin: 11.7 g/dL — ABNORMAL LOW (ref 12.0–15.0)
Lymphocytes Relative: 35.6 % (ref 12.0–46.0)
Lymphs Abs: 0.5 10*3/uL — ABNORMAL LOW (ref 0.7–4.0)
MCHC: 33.9 g/dL (ref 30.0–36.0)
MCV: 98 fl (ref 78.0–100.0)
Monocytes Absolute: 0.1 10*3/uL (ref 0.1–1.0)
Monocytes Relative: 9.6 % (ref 3.0–12.0)
Neutro Abs: 0.7 10*3/uL — ABNORMAL LOW (ref 1.4–7.7)
Neutrophils Relative %: 53 % (ref 43.0–77.0)
Platelets: 65 10*3/uL — ABNORMAL LOW (ref 150.0–400.0)
RBC: 3.53 Mil/uL — ABNORMAL LOW (ref 3.87–5.11)
RDW: 17.1 % — ABNORMAL HIGH (ref 11.5–15.5)
WBC: 1.3 10*3/uL — CL (ref 4.0–10.5)

## 2019-07-19 NOTE — Telephone Encounter (Signed)
Under MEDIA tab there is a file from 4/21 called implantable device which has her device and lead data available  hope this helps Jolyn Nap

## 2019-07-19 NOTE — Telephone Encounter (Signed)
Dr Caryl Comes   This patient needs a MRI scheduled for August, we can not schedule her until radiology has her pacemaker information  They need the make and model and serial number  Of the  pacemaker .Marland Kitchen Patient does not have her card with that information on it

## 2019-07-19 NOTE — Patient Instructions (Addendum)
I did not change your medications today.  Continue to have monthly labs for close monitoring of her liver and for medication safety. (CMP,CBC,IgG)  Continue omeprazole 40 mg twice daily  Avoid NSAIDS  It is time for an upper endoscopy to be scheduled in August to follow-up on your gastric ulcer. Per Dr Tarri Glenn she wants you to have both of your COVID vaccines completed first. Call us back after your second COVID vaccine is completed and it has to be scheduled at least two weeks after the vaccines have been completed   We will plan on an MRI in August 2021 to follow-up on the shape of your liver and to screen for liver cancer.   You have been scheduled for an MRI at _____on ______. Your appointment time is ______. Please arrive 15 minutes prior to your appointment time for registration purposes. Please make certain not to have anything to eat or drink 6 hours prior to your test. In addition, if you have any metal in your body, have a pacemaker or defibrillator, please be sure to let your ordering physician know. This test typically takes 45 minutes to 1 hour to complete. Should you need to reschedule, please call [phone] to do so.  Due to you having a Pacemaker and you do not have your card with you today we can not schedule your MRI , they require make and model number of your pacemaker before we can schedule    High-Fiber Diet Fiber, also called dietary fiber, is a type of carbohydrate that is found in fruits, vegetables, whole grains, and beans. A high-fiber diet can have many health benefits. Your health care provider may recommend a high-fiber diet to help:  Prevent constipation. Fiber can make your bowel movements more regular.  Lower your cholesterol.  Relieve the following conditions: ? Swelling of veins in the anus (hemorrhoids). ? Swelling and irritation (inflammation) of specific areas of the digestive tract (uncomplicated diverticulosis). ? A problem of the large intestine  (colon) that sometimes causes pain and diarrhea (irritable bowel syndrome, IBS).  Prevent overeating as part of a weight-loss plan.  Prevent heart disease, type 2 diabetes, and certain cancers. What is my plan? The recommended daily fiber intake in grams (g) includes:  38 g for men age 93 or younger.  30 g for men over age 64.  29 g for women age 25 or younger.  21 g for women over age 58. You can get the recommended daily intake of dietary fiber by:  Eating a variety of fruits, vegetables, grains, and beans.  Taking a fiber supplement, if it is not possible to get enough fiber through your diet. What do I need to know about a high-fiber diet?  It is better to get fiber through food sources rather than from fiber supplements. There is not a lot of research about how effective supplements are.  Always check the fiber content on the nutrition facts label of any prepackaged food. Look for foods that contain 5 g of fiber or more per serving.  Talk with a diet and nutrition specialist (dietitian) if you have questions about specific foods that are recommended or not recommended for your medical condition, especially if those foods are not listed below.  Gradually increase how much fiber you consume. If you increase your intake of dietary fiber too quickly, you may have bloating, cramping, or gas.  Drink plenty of water. Water helps you to digest fiber. What are tips for following this plan?  Eat a wide variety of high-fiber foods.  Make sure that half of the grains that you eat each day are whole grains.  Eat breads and cereals that are made with whole-grain flour instead of refined flour or white flour.  Eat brown rice, bulgur wheat, or millet instead of white rice.  Start the day with a breakfast that is high in fiber, such as a cereal that contains 5 g of fiber or more per serving.  Use beans in place of meat in soups, salads, and pasta dishes.  Eat high-fiber snacks, such  as berries, raw vegetables, nuts, and popcorn.  Choose whole fruits and vegetables instead of processed forms like juice or sauce. What foods can I eat?  Fruits Berries. Pears. Apples. Oranges. Avocado. Prunes and raisins. Dried figs. Vegetables Sweet potatoes. Spinach. Kale. Artichokes. Cabbage. Broccoli. Cauliflower. Green peas. Carrots. Squash. Grains Whole-grain breads. Multigrain cereal. Oats and oatmeal. Brown rice. Barley. Bulgur wheat. [place]. Quinoa. Bran muffins. Popcorn. Rye wafer crackers. Meats and other proteins Navy, kidney, and pinto beans. Soybeans. Split peas. Lentils. Nuts and seeds. Dairy Fiber-fortified yogurt. Beverages Fiber-fortified soy milk. Fiber-fortified orange juice. Other foods Fiber bars. The items listed above may not be a complete list of recommended foods and beverages. Contact a dietitian for more options. What foods are not recommended? Fruits Fruit juice. Cooked, strained fruit. Vegetables Fried potatoes. Canned vegetables. Well-cooked vegetables. Grains White bread. Pasta made with refined flour. White rice. Meats and other proteins Fatty cuts of meat. Fried chicken or fried fish. Dairy Milk. Yogurt. Cream cheese. Sour cream. Fats and oils Butters. Beverages Soft drinks. Other foods Cakes and pastries. The items listed above may not be a complete list of foods and beverages to avoid. Contact a dietitian for more information. Summary  Fiber is a type of carbohydrate. It is found in fruits, vegetables, whole grains, and beans.  There are many health benefits of eating a high-fiber diet, such as preventing constipation, lowering blood cholesterol, helping with weight loss, and reducing your risk of heart disease, diabetes, and certain cancers.  Gradually increase your intake of fiber. Increasing too fast can result in cramping, bloating, and gas. Drink plenty of water while you increase your fiber.  The best sources of fiber include  whole fruits and vegetables, whole grains, nuts, seeds, and beans. This information is not intended to replace advice given to you by your health care provider. Make sure you discuss any questions you have with your health care provider. Document Revised: 11/15/2016 Document Reviewed: 11/15/2016 Elsevier Patient Education  [place].  Follow up in 4 months   I appreciate the  opportunity to care for you  Thank You   Santo Held

## 2019-07-19 NOTE — Progress Notes (Signed)
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 Johney Maine 05/10/19 History of advanced colon polyps    - Multiple colonoscopies with tubular adenomas and tubulovillous adenomas    - Colonoscopy 2018 at [facility]: 8m polyp in the ascending and at the hepatic flexure    - Surveillance colonoscopy recommended in 1 years    - History of 273mdistal rectal polyp on colonoscopy 03/06/19, 3 TAs, 2 HPs Cirrhosis due to AIH    - 2/21 labs show a MELD of 11, CPT score 7    - hypoalbuminemia, mild coagulopathy, and thrombocytopenia.  Autoimmune hepatitis (AIH) previously on azathioprine    - Diagnosed in 2006    - Liver biopsy 2006: Grade 3, stage I autoimmune hepatitis    - Previously on azathioprine and steroids    - [facility]    - Most recently treated by Dr. HuBenson Norwaylast in 2018    - IgG 3501 02/27/19, 3164 05/01/19, 2620 06/21/19    - resumed azathioprine 100 mg QD 02/2019 Gastric ulcer on EGD 02/2019    - H pylori negative on biopsies    - follow-up recommended in 3 months Esophageal varices on EGD 02/10/16 (HBenson Norway   - grade 2 esophageal varices on EGD 02/2019 Recent hypokalemia Uses a walker at home, wheelchair when out of her apartment Transportation issues  2.6 cm rectal tubulovillous adenoma: Surveillance colonoscopy in 3 years.   Autoimmune hepatitis: Continue azathioprine. Recommend close monitoring with monthly labs until she stabilizes. May need to reduce her dose with progressive cytopenias.   Cirrhosis by labs and imaging: Continue screening for hepatocellular carcinoma recommended given her history of cirrhosis  and esophageal varices. Proceed with cross-sectional screening in 6 months. MRI recommended given her body habitus.   Esophageal varices:  Consider starting non-selective beta-blocker therapy on next visit if there are no obvious contraindication at that time.  Gastric ulcer: PPI BID. She is due repeat EGD. Avoid NSAIDs.    PLAN: - High fiber diet recommended, drink at least 1.5-2 liters of water - Continue daily stool bulking agent such as Metamucil - Abstain from all alcohol - Continue azathioprine 100 mg QD - Monthly labs: CMP, CBC, IgG   - Semi-annual labs: CMP, CBC, AFP and PT/INR - due at time of endoscopy - Continue omeprazole 40 mg BID - Avoid NSAIDs - Add carvedilol in the future for esophageal varices if she can tolerate the dose - EGD to follow-up on the gastric ulcer - MRI for hepatocellular carcinoma screening in August 2021 - Surveillance Colonoscopy 04/2023 - Follow-up in this office in 3-4 months, earlier as needed  I spent over 50 minutes of time, including in depth chart review, independent review of results as outlined above, communicating results with the patient directly, face-to-face time with the patient, coordinating care, ordering studies and medications as appropriate, and documentation.    HPI: Stacey HOOPESs a [age].o. female who returns in scheduled follow-up for a rectal polyp and cirrhosis due to autoimmune hepatitis.  She was seen in consultation 02/27/19 for rectal bleeding and had  endoscopic evaluation 03/06/19 revealing a distal rectal polyp worrisome for malignancy. She had a TEM partial proctectomy of bleeding rectal mass with Dr. Johney Maine.  Pathology revealed a 2.6 cm tubulovillous adenoma.  Margins were uninvolved by dysplasia. She has had no further rectal bleeding following her procedure and is using Miralax daily.   She also has a history of cirrhosis due to autoimmune hepatitis. She has previously been followed by the hepatology clinic at [facility] (last seen  in 2014), the GI clinic at [facility] (last seen 2018), and by Dr. Carol Ada (last seen 12/13/16). She was on azathioprine 100 mg daily and tapering off her steroids at that time.  A liver biopsy 2006 of grade 3 stage I however I am unable to identify a date of the biopsy. More recently followed by Dr. Benson Norway for the autoimmune hepatitis. She has been off of medications since that time. She brings a copy of a letter from Dr. Benson Norway dated 02/14/19 that showed that she needed to have another EGD due to "evidence of cirrhosis" and "varices" seen on her EGD.  However, she was unaware of the diagnosis of cirrhosis.  There is no history of jaundice, ascites, hepatic encephalopathy, hepatorenal syndrome, or hepatopulmonary syndrome.  She resumed azathioprine at the time of her last visit with me 03/28/19. She is tolerating the medication without any complaint. Reports 100% adherence.  Continues to be overwhelmed by her overall decline this year and need for frequent doctor's visits.  No new GI complaints today.   Recent Labs: 02/24/15: IgG 3092 02/27/19: IgG 3501 03/24/19 showed normal CMP except for Na 133, glucose 110, calcium 7.9, albumin 2.4, AST 67, ALT 55. WBC 4.4, hgb 13.4, platelets 78, MCV 94.5, INR 1.4.   05/01/19: TB 1.0, AST 32, ALT 17, alk phos 72, alb 2.9, WBC 1.8, hgb 13.1, platelets 77, ANC 1.0, ALC 0.5, IgG 3164 06/21/19: TB 0.8, AST 32, ALT 19, alk phos 67, alb 2.9, WBC 1.5, hgb 12, platelets 90, ANC 0.8, ALT 0.5, IgG 2620 07/19/19: TB 1.0, AST 30, ALT 17, alk phos 65, alb 2.9, WBC 1.3, ANC 0.7, ALC 0.5, hgb 11.7, platelets 65  Endoscopic History: EGD 03/06/19 showed  Grade II varices were found in the middle third of the esophagus. Moderate portal hypertensive gastropathy was found in the gastric body. Localized mild inflammation characterized by erythema, friability and granularity was found in the gastric fundus. One non-bleeding cratered gastric ulcer with no stigmata of bleeding was found on the lesser  curvature of the stomach. Biopsies confirmed gastritis. There was no H pylori.  Colonoscopy with Dr. Benson Norway 02/10/2016 showed 6 polyps.  4 polyps ranged from 3 to 6 mm were removed and the descending: And transverse colon.  There were 2 polyps ranging from 30 to 40 mm that were biopsied and tattooed in the ascending colon and transverse.  Pathology report showed tubulous villous adenoma of high-grade dysplasia.  The patient was referred to [facility].   Colonoscopy with Dr. Stephanie Acre at [facility] 05/26/16: "The perianal and digital rectal examinations were normal. A greater than 50 mm polyp was found in the proximal ascending colon. The polyp was sessile. Preparations were made for mucosal resection. Saline with methylene blue was injected to raise the lesion. Snare mucosal resection was performed. Resection and retrieval were complete. Coagulation for tissue destruction using snare was successful. A 50 mm polyp was found in the hepatic flexure. The polyp was semi-pedunculated. Preparations were made for mucosal resection. Saline with methylene blue was injected to  raise the lesion. Snare mucosal resection was performed. Resection and retrieval were complete. Coagulation for tissue destruction using snare was successful. The exam was otherwise without abnormality on direct and retroflexion  views." Pathology revealed Tubulovillous adenoma (multiple fragments) with multiple foci of high-grade dysplasia.  Surveillance colonoscopy recommended in 1 year.  She did not have a follow-up colonoscopy until 03/06/19.  Colonoscopy 03/06/19 showed a 20 mm polyp in the distal rectum adjacent to the dentate line. Worrisome for advance polyp and/or adenocarcinoma. Biopsies were consistent with tubulovillous adenoma with high-grade grandular dysplasia. She also had three 1 to 4 mm tubular adenomas and two hyperplastic polyps removed at that time.  TEM partial proctectomy of bleeding rectal mass with Dr. Johney Maine.  Pathology revealed a 2.6 cm  tubulovillous adenoma.  Margins were uninvolved by dysplasia.  Recent abdominal imaging: An abdominal ultrasound from 02/19/2015 showed changes of cirrhosis with a subtle nodularity to the liver contours.  No focal abnormality.  Prior cholecystectomy.  Abdominal ultrasound 03/05/19 showed cirrhosis and splenomegaly. There was no ascites.     Past Medical History:  Diagnosis Date  . Adenomatous polyp of ascending colon   . Anxiety   . Bilateral lower extremity edema    burning sensations  . Bipolar 1 disorder ([place])   . Carpal tunnel syndrome   . Cirrhosis of liver ([place])   . Cutaneous horn   . Drug-induced Parkinsonism ([place])    patient unaware  . Esophageal varices ([place])   . Fibromyalgia    patient denies  . Gastric ulcer   . GERD (gastroesophageal reflux disease)   . Gingival abscess 08/26/2016  . Glaucoma 2003   pt unaware  . Heart attack ([place])   . Heart disease   . Hepatitis, autoimmune ([place]) 08/25/2014  . History of iron deficiency anemia   . Hypotension   . Hypothyroidism   . Migraines   . Neutropenia ([place])   . Osteoporosis   . Pacemaker Medtronic    MRI compatible  . Paranoid schizophrenia ([place])   . Polyarthritis   . Polyosteoarthritis   . Rectal mass   . Seizures ([place])    last sz 08/21/14  . Sinus arrest   . Skin cancer    forehead  . SSS (sick sinus syndrome) ([place])   . Stroke ([place])   . Thrombocytopenia ([place])   . Vertigo    bvvp    Past Surgical History:  Procedure Laterality Date  . BIOPSY  03/06/2019   Procedure: BIOPSY;  Surgeon: Thornton Park, MD;  Location: [facility] ENDOSCOPY;  Service: Gastroenterology;;  EGD and COLON  . CATARACT EXTRACTION Left 2008   pt unaware  . CHOLECYSTECTOMY    . COLONOSCOPY WITH PROPOFOL N/A 03/06/2019   Procedure: COLONOSCOPY WITH PROPOFOL;  Surgeon: Thornton Park, MD;  Location: [facility];  Service: Gastroenterology;  Laterality: N/A;  . ESOPHAGOGASTRODUODENOSCOPY (EGD) WITH PROPOFOL N/A 03/06/2019   Procedure:  ESOPHAGOGASTRODUODENOSCOPY (EGD) WITH PROPOFOL ;  Surgeon: Thornton Park, MD;  Location: [facility];  Service: Gastroenterology;  Laterality: N/A;  . KNEE SURGERY Left 1985  . PACEMAKER INSERTION  2014  . PARTIAL HYSTERECTOMY  1979  . PARTIAL PROCTECTOMY BY TEM N/A 05/10/2019   Procedure: TEM PARTIAL PROCTECTOMY OF RECTAL MASS WITH EXCISION RIGHT PERINEAL SKIN MASS;  Surgeon: Michael Boston, MD;  Location: [facility] ORS;  Service: General;  Laterality: N/A;  . POLYPECTOMY  03/06/2019   Procedure: POLYPECTOMY;  Surgeon: Thornton Park, MD;  Location: [facility];  Service: Gastroenterology;;  . REFRACTIVE SURGERY    .  TUBAL LIGATION      Current Outpatient Medications  Medication Sig Dispense Refill  . azathioprine (IMURAN) 100 MG tablet Take 1 tablet (100 mg total) by mouth daily. (Patient taking differently: Take 100 mg by mouth daily in the afternoon. ) 30 tablet 3  . levothyroxine (SYNTHROID) 75 MCG tablet Take 1 tablet (75 mcg total) by mouth daily before breakfast. 90 tablet 0  . omeprazole (PRILOSEC) 20 MG capsule Take 40 mg by mouth daily before breakfast.     . tamsulosin (FLOMAX) 0.4 MG CAPS capsule Take 0.4 mg by mouth daily.     No current facility-administered medications for this visit.    Allergies as of 07/19/2019 - Review Complete 07/19/2019  Allergen Reaction Noted  . Neomycin Nausea And Vomiting 05/10/2019  . Ambien [zolpidem tartrate] Other (See Comments) 02/21/2013  . Amitriptyline Other (See Comments) 02/21/2013  . Ampicillin Other (See Comments) 02/21/2013  . Anaprox [naproxen sodium] Other (See Comments) 02/21/2013  . Benadryl [diphenhydramine hcl (sleep)] Other (See Comments) 02/21/2013  . Cetirizine & related Other (See Comments) 02/21/2013  . Cortizone-10 [hydrocortisone] Other (See Comments) 02/21/2013  . Darvon [propoxyphene] Other (See Comments) 02/21/2013  . Diazepam Other (See Comments) 02/21/2013  . Diflunisal Swelling and Other (See Comments)  02/21/2013  . Duloxetine Other (See Comments) 02/21/2013  . Flexeril [cyclobenzaprine] Other (See Comments) 02/21/2013  . Lactose intolerance (gi) Swelling and Other (See Comments) 08/23/2014  . Lidocaine Other (See Comments) 02/21/2013  . Meperidine and related Other (See Comments) 02/21/2013  . Metanx [l-methylfolate-algae-b12-b6] Other (See Comments) 02/21/2013  . Metoclopramide Other (See Comments) 02/21/2013  . Morphine and related Other (See Comments) 02/21/2013  . Nuprin [ibuprofen] Other (See Comments) 02/21/2013  . Oxycodone Other (See Comments) 02/21/2013  . Penicillins Other (See Comments) 02/21/2013  . Propoxyphene Other (See Comments) 02/21/2013  . Ranitidine Other (See Comments) 02/21/2013  . Vicodin [hydrocodone-acetaminophen] Other (See Comments) 02/21/2013  . Tylenol [acetaminophen] Rash 02/21/2013    Family History  Problem Relation Age of Onset  . Heart attack Father   . Heart disease Paternal Uncle   . Heart disease Paternal Grandmother   . Diabetes Paternal Grandmother   . Heart disease Paternal Grandfather   . Breast cancer Mother   . Cancer Maternal Grandmother   . CVA Maternal Grandmother   . Cancer Maternal Grandfather   . CVA Maternal Grandfather   . Diabetes Sister   . Diabetes Brother   . Diabetes Brother   . Stroke Daughter     Social History   Socioeconomic History  . Marital status: Widowed    Spouse name: Not on file  . Number of children: Not on file  . Years of education: Not on file  . Highest education level: Not on file  Occupational History  . Not on file  Tobacco Use  . Smoking status: Former Smoker    Quit date: 03/02/2003    Years since quitting: 16.3  . Smokeless tobacco: Never Used  Vaping Use  . Vaping Use: Never used  Substance and Sexual Activity  . Alcohol use: No  . Drug use: No  . Sexual activity: Not on file  Other Topics Concern  . Not on file  Social History Narrative   Lives alone, "have friends and  relatives that come and help me"   caffeine use- coffee -1 cup daily   Social Determinants of Health   Financial Resource Strain: Low Risk   . Difficulty of Paying Living Expenses: Not very hard  Food Insecurity: No Food Insecurity  . Worried About Charity fundraiser in the Last Year: Never true  . Ran Out of Food in the Last Year: Never true  Transportation Needs: Unmet Transportation Needs  . Lack of Transportation (Medical): Yes  . Lack of Transportation (Non-Medical): Yes  Physical Activity: Inactive  . Days of Exercise per Week: 0 days  . Minutes of Exercise per Session: 0 min  Stress: Stress Concern Present  . Feeling of Stress : To some extent  Social Connections: Moderately Isolated  . Frequency of Communication with Friends and Family: More than three times a week  . Frequency of Social Gatherings with Friends and Family: More than three times a week  . Attends Religious Services: 1 to 4 times per year  . Active Member of Clubs or Organizations: No  . Attends Archivist Meetings: Never  . Marital Status: Widowed  Intimate Partner Violence: Not At Risk  . Fear of Current or Ex-Partner: No  . Emotionally Abused: No  . Physically Abused: No  . Sexually Abused: No    Physical Exam: General:   Alert,  well-nourished, pleasant and cooperative in NAD. Sitting in a wheelchair. She has a large bandaid across her forehead.   Heart:  Regular rate and rhythm; no murmurs Pulm: Clear anteriorly; no wheezing Abdomen:  Soft. Central obesity. Nontender. Nondistended. Normal bowel sounds. No rebound or guarding. No fluid wave.  LAD: No inguinal or umbilical LAD Extremities:  Without edema. Neurologic:  Alert and  oriented x4;  grossly normal neurologically; no asterixis or clonus. Skin: No jaundice. Palmar erythema. Spider angioma on the chest wall.   Psych:  Alert and cooperative. Normal mood and affect.     Darriana Deboy L. Tarri Glenn, MD, MPH 07/19/2019, 3:08 PM

## 2019-07-20 ENCOUNTER — Telehealth: Payer: Self-pay | Admitting: Gastroenterology

## 2019-07-20 LAB — IGG: IgG (Immunoglobin G), Serum: 2399 mg/dL — ABNORMAL HIGH (ref 600–1540)

## 2019-07-20 NOTE — Telephone Encounter (Signed)
Returned phone call and let them know these orders need to come from Pts PCP.

## 2019-07-20 NOTE — Telephone Encounter (Signed)
Mickel Baas from [facility] called requesting an order for patient to have visits 2 x a week for 3 weeks and then 1 x a week for 3 weeks.

## 2019-07-25 DIAGNOSIS — R42 Dizziness and giddiness: Secondary | ICD-10-CM | POA: Diagnosis not present

## 2019-07-25 DIAGNOSIS — I519 Heart disease, unspecified: Secondary | ICD-10-CM | POA: Diagnosis not present

## 2019-07-25 DIAGNOSIS — Z483 Aftercare following surgery for neoplasm: Secondary | ICD-10-CM | POA: Diagnosis not present

## 2019-07-25 DIAGNOSIS — K219 Gastro-esophageal reflux disease without esophagitis: Secondary | ICD-10-CM | POA: Diagnosis not present

## 2019-07-25 DIAGNOSIS — G40909 Epilepsy, unspecified, not intractable, without status epilepticus: Secondary | ICD-10-CM | POA: Diagnosis not present

## 2019-07-25 DIAGNOSIS — D6959 Other secondary thrombocytopenia: Secondary | ICD-10-CM | POA: Diagnosis not present

## 2019-07-25 DIAGNOSIS — M25321 Other instability, right elbow: Secondary | ICD-10-CM | POA: Diagnosis not present

## 2019-07-25 DIAGNOSIS — E6609 Other obesity due to excess calories: Secondary | ICD-10-CM | POA: Diagnosis not present

## 2019-07-25 DIAGNOSIS — Z6832 Body mass index (BMI) 32.0-32.9, adult: Secondary | ICD-10-CM | POA: Diagnosis not present

## 2019-07-25 DIAGNOSIS — D702 Other drug-induced agranulocytosis: Secondary | ICD-10-CM | POA: Diagnosis not present

## 2019-07-25 DIAGNOSIS — F419 Anxiety disorder, unspecified: Secondary | ICD-10-CM | POA: Diagnosis not present

## 2019-07-25 DIAGNOSIS — H409 Unspecified glaucoma: Secondary | ICD-10-CM | POA: Diagnosis not present

## 2019-07-25 DIAGNOSIS — Z9049 Acquired absence of other specified parts of digestive tract: Secondary | ICD-10-CM | POA: Diagnosis not present

## 2019-07-25 DIAGNOSIS — F200 Paranoid schizophrenia: Secondary | ICD-10-CM | POA: Diagnosis not present

## 2019-07-25 DIAGNOSIS — M797 Fibromyalgia: Secondary | ICD-10-CM | POA: Diagnosis not present

## 2019-07-25 DIAGNOSIS — G8929 Other chronic pain: Secondary | ICD-10-CM | POA: Diagnosis not present

## 2019-07-25 DIAGNOSIS — Z87891 Personal history of nicotine dependence: Secondary | ICD-10-CM | POA: Diagnosis not present

## 2019-07-25 DIAGNOSIS — M150 Primary generalized (osteo)arthritis: Secondary | ICD-10-CM | POA: Diagnosis not present

## 2019-07-25 DIAGNOSIS — G2119 Other drug induced secondary parkinsonism: Secondary | ICD-10-CM | POA: Diagnosis not present

## 2019-07-25 DIAGNOSIS — K754 Autoimmune hepatitis: Secondary | ICD-10-CM | POA: Diagnosis not present

## 2019-07-25 DIAGNOSIS — M79601 Pain in right arm: Secondary | ICD-10-CM | POA: Diagnosis not present

## 2019-07-25 DIAGNOSIS — Z950 Presence of cardiac pacemaker: Secondary | ICD-10-CM | POA: Diagnosis not present

## 2019-07-25 DIAGNOSIS — E039 Hypothyroidism, unspecified: Secondary | ICD-10-CM | POA: Diagnosis not present

## 2019-07-25 DIAGNOSIS — F319 Bipolar disorder, unspecified: Secondary | ICD-10-CM | POA: Diagnosis not present

## 2019-07-25 DIAGNOSIS — K7460 Unspecified cirrhosis of liver: Secondary | ICD-10-CM | POA: Diagnosis not present

## 2019-07-26 ENCOUNTER — Other Ambulatory Visit: Payer: Self-pay | Admitting: Gastroenterology

## 2019-07-27 DIAGNOSIS — M79601 Pain in right arm: Secondary | ICD-10-CM | POA: Diagnosis not present

## 2019-07-27 DIAGNOSIS — F419 Anxiety disorder, unspecified: Secondary | ICD-10-CM | POA: Diagnosis not present

## 2019-07-27 DIAGNOSIS — G8929 Other chronic pain: Secondary | ICD-10-CM | POA: Diagnosis not present

## 2019-07-27 DIAGNOSIS — M150 Primary generalized (osteo)arthritis: Secondary | ICD-10-CM | POA: Diagnosis not present

## 2019-07-27 DIAGNOSIS — Z483 Aftercare following surgery for neoplasm: Secondary | ICD-10-CM | POA: Diagnosis not present

## 2019-07-27 DIAGNOSIS — M797 Fibromyalgia: Secondary | ICD-10-CM | POA: Diagnosis not present

## 2019-07-27 NOTE — Telephone Encounter (Signed)
Wanted to advice [place] that her COVID19 vacines are scheduled for July 6th, 2021 and August 21, 2019.

## 2019-07-27 NOTE — Telephone Encounter (Signed)
Noted:  Will wait until I see when the MRI will be scheduled then will schedule EGD

## 2019-07-27 NOTE — Telephone Encounter (Signed)
Make of Pacemaker is Medtronic   Device: Advisa DR MRI  [id number]     ID Number [id number]   Called radiology to schedule MRI for late Aug and the scheduler said this could not be scheduled until a radiologist reviewed her chart and the type of device she has.... Will check next week to see if this has been scheduled yet....Marland KitchenMarland Kitchen

## 2019-07-31 ENCOUNTER — Ambulatory Visit (INDEPENDENT_AMBULATORY_CARE_PROVIDER_SITE_OTHER): Payer: Medicare Other | Admitting: *Deleted

## 2019-07-31 DIAGNOSIS — R55 Syncope and collapse: Secondary | ICD-10-CM | POA: Diagnosis not present

## 2019-07-31 LAB — CUP PACEART REMOTE DEVICE CHECK
Battery Remaining Longevity: 49 mo
Battery Voltage: 2.98 V
Brady Statistic AP VP Percent: 0.01 %
Brady Statistic AP VS Percent: 1.81 %
Brady Statistic AS VP Percent: 0.13 %
Brady Statistic AS VS Percent: 98.05 %
Brady Statistic RA Percent Paced: 1.81 %
Brady Statistic RV Percent Paced: 0.14 %
Date Time Interrogation Session: 20210706071412
Implantable Lead Implant Date: 20141205
Implantable Lead Implant Date: 20141205
Implantable Lead Location: 753859
Implantable Lead Location: 753860
Implantable Lead Model: 5076
Implantable Lead Model: 5076
Implantable Pulse Generator Implant Date: 20141205
Lead Channel Impedance Value: 304 Ohm
Lead Channel Impedance Value: 380 Ohm
Lead Channel Impedance Value: 380 Ohm
Lead Channel Impedance Value: 399 Ohm
Lead Channel Pacing Threshold Amplitude: 0.5 V
Lead Channel Pacing Threshold Amplitude: 1 V
Lead Channel Pacing Threshold Pulse Width: 0.4 ms
Lead Channel Pacing Threshold Pulse Width: 0.4 ms
Lead Channel Sensing Intrinsic Amplitude: 1.75 mV
Lead Channel Sensing Intrinsic Amplitude: 1.75 mV
Lead Channel Sensing Intrinsic Amplitude: 4.125 mV
Lead Channel Sensing Intrinsic Amplitude: 4.125 mV
Lead Channel Setting Pacing Amplitude: 2 V
Lead Channel Setting Pacing Amplitude: 2.5 V
Lead Channel Setting Pacing Pulse Width: 0.4 ms
Lead Channel Setting Sensing Sensitivity: 0.6 mV

## 2019-07-31 NOTE — Telephone Encounter (Signed)
Wants to get on the books for the EGD in August please. Can only schedule one appointment per week. Please call her at [phone]

## 2019-07-31 NOTE — Telephone Encounter (Signed)
MRI is Aug the 6th

## 2019-08-01 NOTE — Progress Notes (Signed)
Remote pacemaker transmission.   

## 2019-08-03 DIAGNOSIS — M797 Fibromyalgia: Secondary | ICD-10-CM | POA: Diagnosis not present

## 2019-08-03 DIAGNOSIS — G8929 Other chronic pain: Secondary | ICD-10-CM | POA: Diagnosis not present

## 2019-08-03 DIAGNOSIS — F419 Anxiety disorder, unspecified: Secondary | ICD-10-CM | POA: Diagnosis not present

## 2019-08-03 DIAGNOSIS — M150 Primary generalized (osteo)arthritis: Secondary | ICD-10-CM | POA: Diagnosis not present

## 2019-08-03 DIAGNOSIS — M79601 Pain in right arm: Secondary | ICD-10-CM | POA: Diagnosis not present

## 2019-08-03 DIAGNOSIS — Z483 Aftercare following surgery for neoplasm: Secondary | ICD-10-CM | POA: Diagnosis not present

## 2019-08-06 DIAGNOSIS — F419 Anxiety disorder, unspecified: Secondary | ICD-10-CM | POA: Diagnosis not present

## 2019-08-06 DIAGNOSIS — M797 Fibromyalgia: Secondary | ICD-10-CM | POA: Diagnosis not present

## 2019-08-06 DIAGNOSIS — M79601 Pain in right arm: Secondary | ICD-10-CM | POA: Diagnosis not present

## 2019-08-06 DIAGNOSIS — M150 Primary generalized (osteo)arthritis: Secondary | ICD-10-CM | POA: Diagnosis not present

## 2019-08-06 DIAGNOSIS — G8929 Other chronic pain: Secondary | ICD-10-CM | POA: Diagnosis not present

## 2019-08-06 DIAGNOSIS — Z483 Aftercare following surgery for neoplasm: Secondary | ICD-10-CM | POA: Diagnosis not present

## 2019-08-08 DIAGNOSIS — M150 Primary generalized (osteo)arthritis: Secondary | ICD-10-CM | POA: Diagnosis not present

## 2019-08-08 DIAGNOSIS — G8929 Other chronic pain: Secondary | ICD-10-CM | POA: Diagnosis not present

## 2019-08-08 DIAGNOSIS — M79601 Pain in right arm: Secondary | ICD-10-CM | POA: Diagnosis not present

## 2019-08-08 DIAGNOSIS — M797 Fibromyalgia: Secondary | ICD-10-CM | POA: Diagnosis not present

## 2019-08-08 DIAGNOSIS — F419 Anxiety disorder, unspecified: Secondary | ICD-10-CM | POA: Diagnosis not present

## 2019-08-08 DIAGNOSIS — Z483 Aftercare following surgery for neoplasm: Secondary | ICD-10-CM | POA: Diagnosis not present

## 2019-08-15 DIAGNOSIS — M797 Fibromyalgia: Secondary | ICD-10-CM | POA: Diagnosis not present

## 2019-08-15 DIAGNOSIS — M150 Primary generalized (osteo)arthritis: Secondary | ICD-10-CM | POA: Diagnosis not present

## 2019-08-15 DIAGNOSIS — Z483 Aftercare following surgery for neoplasm: Secondary | ICD-10-CM | POA: Diagnosis not present

## 2019-08-15 DIAGNOSIS — G8929 Other chronic pain: Secondary | ICD-10-CM | POA: Diagnosis not present

## 2019-08-15 DIAGNOSIS — F419 Anxiety disorder, unspecified: Secondary | ICD-10-CM | POA: Diagnosis not present

## 2019-08-15 DIAGNOSIS — M79601 Pain in right arm: Secondary | ICD-10-CM | POA: Diagnosis not present

## 2019-08-16 ENCOUNTER — Ambulatory Visit (INDEPENDENT_AMBULATORY_CARE_PROVIDER_SITE_OTHER): Payer: Medicare Other

## 2019-08-16 VITALS — Ht 69.0 in | Wt 211.0 lb

## 2019-08-16 DIAGNOSIS — Z0000 Encounter for general adult medical examination without abnormal findings: Secondary | ICD-10-CM

## 2019-08-16 NOTE — Progress Notes (Addendum)
Subjective:   Stacey Velazquez is a 74 y.o. female who presents for an Initial Medicare Annual Wellness Visit.  I connected with [name] today by telephone and verified that I am speaking with the correct person using two identifiers. Location patient: home Location provider: work Persons participating in the virtual visit: patient, Marine scientist.    I discussed the limitations, risks, security and privacy concerns of performing an evaluation and management service by telephone and the availability of in person appointments. I also discussed with the patient that there may be a patient responsible charge related to this service. The patient expressed understanding and verbally consented to this telephonic visit.    Interactive audio and video telecommunications were attempted between this provider and patient, however failed, due to patient having technical difficulties OR patient did not have access to video capability.  We continued and completed visit with audio only.  Some vital signs may be absent or patient reported.   Time Spent with patient on telephone encounter: 45 minutes  Review of Systems           Objective:    Today's Vitals   08/16/19 1028  Weight: (!) 211 lb (95.7 kg)  Height: 5\' 9"  (1.753 m)  PainSc: 9    Body mass index is 31.16 kg/m.  Advanced Directives 08/16/2019 05/22/2019 05/10/2019 05/08/2019 03/25/2019 03/24/2019 03/24/2019  Does Patient Have a Medical Advance Directive? Yes Yes Yes Yes No No Yes  Type of Paramedic of [place];Living will [place];Living will [place];Living will [place];Living will - - [place];Living will  Does patient want to make changes to medical advance directive? - No - Patient declined No - Patient declined No - Patient declined No - Patient declined - No - Guardian declined  Copy of [place] in Chart? No - copy requested  No - copy requested No - copy requested No - copy requested - - No - copy requested  Would patient like information on creating a medical advance directive? - - - - No - Patient declined - -    Current Medications (verified) Outpatient Encounter Medications as of 08/16/2019  Medication Sig  . azaTHIOprine (IMURAN) 50 MG tablet TAKE TWO TABLETS (100 MG TOTAL) BY MOUTH DAILY.  Marland Kitchen levothyroxine (SYNTHROID) 75 MCG tablet Take 1 tablet (75 mcg total) by mouth daily before breakfast.  . omeprazole (PRILOSEC) 20 MG capsule Take 40 mg by mouth daily before breakfast.   . tamsulosin (FLOMAX) 0.4 MG CAPS capsule Take 0.4 mg by mouth daily.   No facility-administered encounter medications on file as of 08/16/2019.    Allergies (verified) Neomycin, Ambien [zolpidem tartrate], Amitriptyline, Ampicillin, Anaprox [naproxen sodium], Benadryl [diphenhydramine hcl (sleep)], Cetirizine & related, Cortizone-10 [hydrocortisone], Darvon [propoxyphene], Diazepam, Diflunisal, Duloxetine, Flexeril [cyclobenzaprine], Lactose intolerance (gi), Lidocaine, Meperidine and related, Metanx [l-methylfolate-algae-b12-b6], Metoclopramide, Morphine and related, Nuprin [ibuprofen], Oxycodone, Penicillins, Propoxyphene, Ranitidine, Vicodin [hydrocodone-acetaminophen], and Tylenol [acetaminophen]   History: Past Medical History:  Diagnosis Date  . Adenomatous polyp of ascending colon   . Anxiety   . Bilateral lower extremity edema    burning sensations  . Bipolar 1 disorder ([place])   . Carpal tunnel syndrome   . Cirrhosis of liver ([place])   . Cutaneous horn   . Drug-induced Parkinsonism ([place])    patient unaware  . Esophageal varices ([place])   . Fibromyalgia    patient denies  . Gastric ulcer   . GERD (  gastroesophageal reflux disease)   . Gingival abscess 08/26/2016  . Glaucoma 2003   pt unaware  . Heart attack ([place])   . Heart disease   . Hepatitis, autoimmune ([place]) 08/25/2014  . History of iron deficiency anemia   .  Hypotension   . Hypothyroidism   . Migraines   . Neutropenia ([place])   . Osteoporosis   . Pacemaker Medtronic    MRI compatible  . Paranoid schizophrenia ([place])   . Polyarthritis   . Polyosteoarthritis   . Rectal mass   . Seizures ([place])    last sz 08/21/14  . Sinus arrest   . Skin cancer    forehead  . SSS (sick sinus syndrome) ([place])   . Stroke ([place])   . Thrombocytopenia ([place])   . Vertigo    bvvp   Past Surgical History:  Procedure Laterality Date  . BIOPSY  03/06/2019   Procedure: BIOPSY;  Surgeon: Thornton Park, MD;  Location: [facility];  Service: Gastroenterology;;  EGD and COLON  . CATARACT EXTRACTION Left 2008   pt unaware  . CHOLECYSTECTOMY    . COLONOSCOPY WITH PROPOFOL N/A 03/06/2019   Procedure: COLONOSCOPY WITH PROPOFOL;  Surgeon: Thornton Park, MD;  Location: [facility];  Service: Gastroenterology;  Laterality: N/A;  . ESOPHAGOGASTRODUODENOSCOPY (EGD) WITH PROPOFOL N/A 03/06/2019   Procedure: ESOPHAGOGASTRODUODENOSCOPY (EGD) WITH PROPOFOL ;  Surgeon: Thornton Park, MD;  Location: [facility];  Service: Gastroenterology;  Laterality: N/A;  . KNEE SURGERY Left 1985  . PACEMAKER INSERTION  2014  . PARTIAL HYSTERECTOMY  1979  . PARTIAL PROCTECTOMY BY TEM N/A 05/10/2019   Procedure: TEM PARTIAL PROCTECTOMY OF RECTAL MASS WITH EXCISION RIGHT PERINEAL SKIN MASS;  Surgeon: Michael Boston, MD;  Location: [facility];  Service: General;  Laterality: N/A;  . POLYPECTOMY  03/06/2019   Procedure: POLYPECTOMY;  Surgeon: Thornton Park, MD;  Location: [facility];  Service: Gastroenterology;;  . REFRACTIVE SURGERY    . TUBAL LIGATION     Family History  Problem Relation Age of Onset  . Heart attack Father   . Heart disease Paternal Uncle   . Heart disease Paternal Grandmother   . Diabetes Paternal Grandmother   . Heart disease Paternal Grandfather   . Breast cancer Mother   . Cancer Maternal Grandmother   . CVA Maternal Grandmother   . Cancer Maternal Grandfather     . CVA Maternal Grandfather   . Diabetes Sister   . Diabetes Brother   . Diabetes Brother   . Stroke Daughter    Social History   Socioeconomic History  . Marital status: Widowed    Spouse name: Not on file  . Number of children: Not on file  . Years of education: Not on file  . Highest education level: Not on file  Occupational History  . Not on file  Tobacco Use  . Smoking status: Former Smoker    Quit date: 03/02/2003    Years since quitting: 16.4  . Smokeless tobacco: Never Used  Vaping Use  . Vaping Use: Never used  Substance and Sexual Activity  . Alcohol use: No  . Drug use: No  . Sexual activity: Not on file  Other Topics Concern  . Not on file  Social History Narrative   Lives alone, "have friends and relatives that come and help me"   caffeine use- coffee -1 cup daily   Social Determinants of Health   Financial Resource Strain: Low Risk   . Difficulty of Paying Living Expenses: Not  very hard  Food Insecurity: No Food Insecurity  . Worried About Charity fundraiser in the Last Year: Never true  . Ran Out of Food in the Last Year: Never true  Transportation Needs: Unmet Transportation Needs  . Lack of Transportation (Medical): Yes  . Lack of Transportation (Non-Medical): Yes  Physical Activity: Inactive  . Days of Exercise per Week: 0 days  . Minutes of Exercise per Session: 0 min  Stress: Stress Concern Present  . Feeling of Stress : To some extent  Social Connections: Moderately Isolated  . Frequency of Communication with Friends and Family: More than three times a week  . Frequency of Social Gatherings with Friends and Family: More than three times a week  . Attends Religious Services: 1 to 4 times per year  . Active Member of Clubs or Organizations: No  . Attends Archivist Meetings: Never  . Marital Status: Widowed    Tobacco Counseling Counseling given: Not Answered   Clinical Intake:  Pre-visit preparation completed: Yes  Pain :  0-10 Pain Score: 9  Pain Type: Chronic pain Pain Location: Generalized Pain Onset: More than a month ago Pain Frequency: Constant Pain Relieving Factors: None  Pain Relieving Factors: None  Nutritional Status: BMI > 30  Obese Nutritional Risks: None Diabetes: No  How often do you need to have someone help you when you read instructions, pamphlets, or other written materials from your doctor or pharmacy?: 1 - Never  Diabetic?No  Interpreter Needed?: No  Information entered by :: Caroleen Hamman LPN   Activities of Daily Living In your present state of health, do you have any difficulty performing the following activities: 08/16/2019 05/10/2019  Hearing? N N  Vision? N N  Difficulty concentrating or making decisions? N N  Walking or climbing stairs? Y Y  Comment - -  Dressing or bathing? N N  Comment - -  Doing errands, shopping? N [place] and eating ? N -  Using the Toilet? N -  In the past six months, have you accidently leaked urine? N -  Do you have problems with loss of bowel control? N -  Managing your Medications? N -  Managing your Finances? N -  Housekeeping or managing your Housekeeping? N -  Comment - -  Some recent data might be hidden    Patient Care Team: Libby Maw, MD as PCP - General (Family Medicine) [name], MD as PCP - Electrophysiology (Cardiology) [name], MD as Consulting Physician (Cardiology) Michael Boston, MD as Consulting Physician (General Surgery) Thornton Park, MD as Consulting Physician (Gastroenterology)  Indicate any recent [facility] you may have received from other than [facility] providers in the past year (date may be approximate).     Assessment:   This is a routine wellness examination for Sloan.  Hearing/Vision screen  Hearing Screening   125Hz  250Hz  500Hz  1000Hz  2000Hz  3000Hz  4000Hz  6000Hz  8000Hz   Right ear:           Left ear:           Comments: No  issues  Vision Screening Comments: Wears glasses Last eye exam-08/2014  Dietary issues and exercise activities discussed: Current Exercise Habits: The patient does not participate in regular exercise at present  Goals    . Patient Stated     Maintain healthy diet & water intake      Depression Screen [facility] 2/9 Scores 08/16/2019 03/24/2019 03/20/2019 03/20/2019  04/18/2017 04/18/2017 01/12/2017  PHQ - 2 Score 0 0 0 0 0 0 0  PHQ- 9 Score - - - - 1 1 1     Fall Risk Fall Risk  08/16/2019 03/24/2019 04/18/2017 08/26/2016 03/01/2016  Falls in the past year? 0 1 Yes No No  Number falls in past yr: 0 0 1 - -  Injury with Fall? 0 0 No - -  Risk for fall due to : - History of fall(s);Impaired balance/gait;Impaired mobility - - -  Follow up Falls prevention discussed Education provided;Falls prevention discussed Falls evaluation completed - -    Any stairs in or around the home? No  If so, are there any without handrails? No  Home free of loose throw rugs in walkways, pet beds, electrical cords, etc? Yes  Adequate lighting in your home to reduce risk of falls? Yes   ASSISTIVE DEVICES UTILIZED TO PREVENT FALLS:  Life alert? No  Use of a cane, walker or w/c? Yes  Grab bars in the bathroom? No  Shower chair or bench in shower? No  Elevated toilet seat or a handicapped toilet? No   TIMED UP AND GO:  Was the test performed? No . Virtual visit   Cognitive Function: No cognitive impairment noted        Immunizations Immunization History  Administered Date(s) Administered  . Influenza,inj,Quad PF,6+ Mos 10/29/2014, 01/12/2017  . PFIZER SARS-COV-2 Vaccination 07/31/2019  . PPD Test 01/15/2013  . Pneumococcal Conjugate-13 04/18/2017  . Tdap 04/18/2017    TDAP status: Up to date   Flu Vaccine status: Up to date   Pneumococcal Vaccine Status: Pneumovax-23 due-Discuss with PCP at next office visit  Covid-19 vaccine status: Completed vaccines  Qualifies for Shingles Vaccine? Yes    Zostavax completed No   Shingrix Completed?: No.    Education has been provided regarding the importance of this vaccine. Patient has been advised to call insurance company to determine out of pocket expense if they have not yet received this vaccine. Advised may also receive vaccine at local pharmacy or Health Dept. Verbalized acceptance and understanding.  Screening Tests Health Maintenance  Topic Date Due  . Hepatitis C Screening  Never done  . DEXA SCAN  Never done  . PNA vac Low Risk Adult (2 of 2 - PPSV23) 04/19/2018  . COVID-19 Vaccine (2 - Pfizer 2-dose series) 08/21/2019  . INFLUENZA VACCINE  08/26/2019  . TETANUS/TDAP  04/19/2027  . COLONOSCOPY  03/05/2029    Health Maintenance  Health Maintenance Due  Topic Date Due  . Hepatitis C Screening  Never done  . DEXA SCAN  Never done  . PNA vac Low Risk Adult (2 of 2 - PPSV23) 04/19/2018    Colorectal cancer screening: Completed 03/06/2019. Repeat every 10 years   Mammogram Status: Declined at this time  Bone Density Status: Declined at this time  Lung Cancer Screening: (Low Dose CT Chest recommended if Age 66-80 years, 30 pack-year currently smoking OR have quit w/in 15years.) does not qualify.     Additional Screening:  Hepatitis C Screening: does qualify; Discuss with PCP at next office visit  Vision Screening: Recommended annual ophthalmology exams for early detection of glaucoma and other disorders of the eye. Is the patient up to date with their annual eye exam?  No  Who is the provider or what is the name of the office in which the patient attends annual eye exams? Unsure If pt is not established with a provider, would they like to  be referred to a provider to establish care? No .   Dental Screening: Recommended annual dental exams for proper oral hygiene  Community Resource Referral / Chronic Care Management: CRR required this visit?  No   CCM required this visit?  No      Plan:     I have personally  reviewed and noted the following in the patient's chart:   . Medical and social history . Use of alcohol, tobacco or illicit drugs  . Current medications and supplements . Functional ability and status . Nutritional status . Physical activity . Advanced directives . List of other physicians . Hospitalizations, surgeries, and ER visits in previous 12 months . Vitals . Screenings to include cognitive, depression, and falls . Referrals and appointments  In addition, I have reviewed and discussed with patient certain preventive protocols, quality metrics, and best practice recommendations. A written personalized care plan for preventive services as well as general preventive health recommendations were provided to patient.  Due to this being a telephonic visit, the after visit summary with patients personalized plan was offered to patient via mail or my-chart.  Per request, patient was mailed a copy of [place], LPN   4/48/1856  Nurse Health Advisor  Nurse Notes: None   agreed

## 2019-08-16 NOTE — Patient Instructions (Signed)
Stacey Velazquez , Thank you for taking time to come for your Medicare Wellness Visit. I appreciate your ongoing commitment to your health goals. Please review the following plan we discussed and let me know if I can assist you in the future.   Screening recommendations/referrals: Colonoscopy: Completed 03/06/2019- No longer indicated after age 75 Mammogram: Declined at this time. Please call the office if you change your mind. Bone Density: Declined at this time. Please call the office if you change your mind. Recommended yearly ophthalmology/optometry visit for glaucoma screening and checkup Recommended yearly dental visit for hygiene and checkup  Vaccinations: Influenza vaccine: Up to date-Due 09/2019 Pneumococcal vaccine:Completed vaccines Tdap vaccine: Up to Date-Due 04/19/2027 Shingles vaccine: Declined   Covid-19:Completed first vaccine. Second dose scheduled.  Advanced directives: Please bring a copy to next appointment if available  Conditions/risks identified: See problem list  Next appointment: Follow up in one year for your annual wellness visit    Preventive Care 65 Years and Older, Female Preventive care refers to lifestyle choices and visits with your health care provider that can promote health and wellness. What does preventive care include?  A yearly physical exam. This is also called an annual well check.  Dental exams once or twice a year.  Routine eye exams. Ask your health care provider how often you should have your eyes checked.  Personal lifestyle choices, including:  Daily care of your teeth and gums.  Regular physical activity.  Eating a healthy diet.  Avoiding tobacco and drug use.  Limiting alcohol use.  Practicing safe sex.  Taking low-dose aspirin every day.  Taking vitamin and mineral supplements as recommended by your health care provider. What happens during an annual well check? The services and screenings done by your health care provider  during your annual well check will depend on your age, overall health, lifestyle risk factors, and family history of disease. Counseling  Your health care provider may ask you questions about your:  Alcohol use.  Tobacco use.  Drug use.  Emotional well-being.  Home and relationship well-being.  Sexual activity.  Eating habits.  History of falls.  Memory and ability to understand (cognition).  Work and work Statistician.  Reproductive health. Screening  You may have the following tests or measurements:  Height, weight, and BMI.  Blood pressure.  Lipid and cholesterol levels. These may be checked every 5 years, or more frequently if you are over 78 years old.  Skin check.  Lung cancer screening. You may have this screening every year starting at age 72 if you have a 30-pack-year history of smoking and currently smoke or have quit within the past 15 years.  Fecal occult blood test (FOBT) of the stool. You may have this test every year starting at age 60.  Flexible sigmoidoscopy or colonoscopy. You may have a sigmoidoscopy every 5 years or a colonoscopy every 10 years starting at age 61.  Hepatitis C blood test.  Hepatitis B blood test.  Sexually transmitted disease (STD) testing.  Diabetes screening. This is done by checking your blood sugar (glucose) after you have not eaten for a while (fasting). You may have this done every 1-3 years.  Bone density scan. This is done to screen for osteoporosis. You may have this done starting at age 15.  Mammogram. This may be done every 1-2 years. Talk to your health care provider about how often you should have regular mammograms. Talk with your health care provider about your test results, treatment options,  and if necessary, the need for more tests. Vaccines  Your health care provider may recommend certain vaccines, such as:  Influenza vaccine. This is recommended every year.  Tetanus, diphtheria, and acellular pertussis  (Tdap, Td) vaccine. You may need a Td booster every 10 years.  Zoster vaccine. You may need this after age 51.  Pneumococcal 13-valent conjugate (PCV13) vaccine. One dose is recommended after age 3.  Pneumococcal polysaccharide (PPSV23) vaccine. One dose is recommended after age 39. Talk to your health care provider about which screenings and vaccines you need and how often you need them. This information is not intended to replace advice given to you by your health care provider. Make sure you discuss any questions you have with your health care provider. Document Released: 02/07/2015 Document Revised: 10/01/2015 Document Reviewed: 11/12/2014 Elsevier Interactive Patient Education  2017 [place] Prevention in the Home Falls can cause injuries. They can happen to people of all ages. There are many things you can do to make your home safe and to help prevent falls. What can I do on the outside of my home?  Regularly fix the edges of walkways and driveways and fix any cracks.  Remove anything that might make you trip as you walk through a door, such as a raised step or threshold.  Trim any bushes or trees on the path to your home.  Use bright outdoor lighting.  Clear any walking paths of anything that might make someone trip, such as rocks or tools.  Regularly check to see if handrails are loose or broken. Make sure that both sides of any steps have handrails.  Any raised decks and porches should have guardrails on the edges.  Have any leaves, snow, or ice cleared regularly.  Use sand or salt on walking paths during winter.  Clean up any spills in your garage right away. This includes oil or grease spills. What can I do in the bathroom?  Use night lights.  Install grab bars by the toilet and in the tub and shower. Do not use towel bars as grab bars.  Use non-skid mats or decals in the tub or shower.  If you need to sit down in the shower, use a plastic, non-slip  stool.  Keep the floor dry. Clean up any water that spills on the floor as soon as it happens.  Remove soap buildup in the tub or shower regularly.  Attach bath mats securely with double-sided non-slip rug tape.  Do not have throw rugs and other things on the floor that can make you trip. What can I do in the bedroom?  Use night lights.  Make sure that you have a light by your bed that is easy to reach.  Do not use any sheets or blankets that are too big for your bed. They should not hang down onto the floor.  Have a firm chair that has side arms. You can use this for support while you get dressed.  Do not have throw rugs and other things on the floor that can make you trip. What can I do in the kitchen?  Clean up any spills right away.  Avoid walking on wet floors.  Keep items that you use a lot in easy-to-reach places.  If you need to reach something above you, use a strong step stool that has a grab bar.  Keep electrical cords out of the way.  Do not use floor polish or wax that makes floors slippery. If  you must use wax, use non-skid floor wax.  Do not have throw rugs and other things on the floor that can make you trip. What can I do with my stairs?  Do not leave any items on the stairs.  Make sure that there are handrails on both sides of the stairs and use them. Fix handrails that are broken or loose. Make sure that handrails are as long as the stairways.  Check any carpeting to make sure that it is firmly attached to the stairs. Fix any carpet that is loose or worn.  Avoid having throw rugs at the top or bottom of the stairs. If you do have throw rugs, attach them to the floor with carpet tape.  Make sure that you have a light switch at the top of the stairs and the bottom of the stairs. If you do not have them, ask someone to add them for you. What else can I do to help prevent falls?  Wear shoes that:  Do not have high heels.  Have rubber bottoms.  Are  comfortable and fit you well.  Are closed at the toe. Do not wear sandals.  If you use a stepladder:  Make sure that it is fully opened. Do not climb a closed stepladder.  Make sure that both sides of the stepladder are locked into place.  Ask someone to hold it for you, if possible.  Clearly mark and make sure that you can see:  Any grab bars or handrails.  First and last steps.  Where the edge of each step is.  Use tools that help you move around (mobility aids) if they are needed. These include:  Canes.  Walkers.  Scooters.  Crutches.  Turn on the lights when you go into a dark area. Replace any light bulbs as soon as they burn out.  Set up your furniture so you have a clear path. Avoid moving your furniture around.  If any of your floors are uneven, fix them.  If there are any pets around you, be aware of where they are.  Review your medicines with your doctor. Some medicines can make you feel dizzy. This can increase your chance of falling. Ask your doctor what other things that you can do to help prevent falls. This information is not intended to replace advice given to you by your health care provider. Make sure you discuss any questions you have with your health care provider. Document Released: 11/07/2008 Document Revised: 06/19/2015 Document Reviewed: 02/15/2014 Elsevier Interactive Patient Education  2017 [employer].

## 2019-08-17 ENCOUNTER — Other Ambulatory Visit (INDEPENDENT_AMBULATORY_CARE_PROVIDER_SITE_OTHER): Payer: Medicare Other

## 2019-08-17 DIAGNOSIS — R748 Abnormal levels of other serum enzymes: Secondary | ICD-10-CM | POA: Diagnosis not present

## 2019-08-17 DIAGNOSIS — K754 Autoimmune hepatitis: Secondary | ICD-10-CM

## 2019-08-17 DIAGNOSIS — K7469 Other cirrhosis of liver: Secondary | ICD-10-CM

## 2019-08-17 LAB — CBC WITH DIFFERENTIAL/PLATELET
Basophils Absolute: 0 10*3/uL (ref 0.0–0.1)
Basophils Relative: 0.8 % (ref 0.0–3.0)
Eosinophils Absolute: 0 10*3/uL (ref 0.0–0.7)
Eosinophils Relative: 1.4 % (ref 0.0–5.0)
HCT: 35.8 % — ABNORMAL LOW (ref 36.0–46.0)
Hemoglobin: 12.1 g/dL (ref 12.0–15.0)
Lymphocytes Relative: 35.6 % (ref 12.0–46.0)
Lymphs Abs: 0.5 10*3/uL — ABNORMAL LOW (ref 0.7–4.0)
MCHC: 33.7 g/dL (ref 30.0–36.0)
MCV: 96.6 fl (ref 78.0–100.0)
Monocytes Absolute: 0.2 10*3/uL (ref 0.1–1.0)
Monocytes Relative: 12.4 % — ABNORMAL HIGH (ref 3.0–12.0)
Neutro Abs: 0.6 10*3/uL — ABNORMAL LOW (ref 1.4–7.7)
Neutrophils Relative %: 49.8 % (ref 43.0–77.0)
Platelets: 63 10*3/uL — ABNORMAL LOW (ref 150.0–400.0)
RBC: 3.7 Mil/uL — ABNORMAL LOW (ref 3.87–5.11)
RDW: 16 % — ABNORMAL HIGH (ref 11.5–15.5)
WBC: 1.3 10*3/uL — CL (ref 4.0–10.5)

## 2019-08-17 LAB — COMPREHENSIVE METABOLIC PANEL
ALT: 19 U/L (ref 0–35)
AST: 33 U/L (ref 0–37)
Albumin: 3 g/dL — ABNORMAL LOW (ref 3.5–5.2)
Alkaline Phosphatase: 65 U/L (ref 39–117)
BUN: 12 mg/dL (ref 6–23)
CO2: 23 mEq/L (ref 19–32)
Calcium: 8.6 mg/dL (ref 8.4–10.5)
Chloride: 105 mEq/L (ref 96–112)
Creatinine, Ser: 0.57 mg/dL (ref 0.40–1.20)
GFR: 103.36 mL/min (ref >60.00)
Glucose, Bld: 81 mg/dL (ref 70–99)
Potassium: 3.8 mEq/L (ref 3.5–5.1)
Sodium: 133 mEq/L — ABNORMAL LOW (ref 135–145)
Total Bilirubin: 0.8 mg/dL (ref 0.2–1.2)
Total Protein: 6.7 g/dL (ref 6.0–8.3)

## 2019-08-18 LAB — IGG: IgG (Immunoglobin G), Serum: 2497 mg/dL — ABNORMAL HIGH (ref 600–1540)

## 2019-08-22 ENCOUNTER — Telehealth: Payer: Self-pay | Admitting: Family Medicine

## 2019-08-22 ENCOUNTER — Other Ambulatory Visit: Payer: Self-pay

## 2019-08-22 DIAGNOSIS — M797 Fibromyalgia: Secondary | ICD-10-CM | POA: Diagnosis not present

## 2019-08-22 DIAGNOSIS — G8929 Other chronic pain: Secondary | ICD-10-CM | POA: Diagnosis not present

## 2019-08-22 DIAGNOSIS — M79601 Pain in right arm: Secondary | ICD-10-CM | POA: Diagnosis not present

## 2019-08-22 DIAGNOSIS — M150 Primary generalized (osteo)arthritis: Secondary | ICD-10-CM | POA: Diagnosis not present

## 2019-08-22 DIAGNOSIS — Z483 Aftercare following surgery for neoplasm: Secondary | ICD-10-CM | POA: Diagnosis not present

## 2019-08-22 DIAGNOSIS — F419 Anxiety disorder, unspecified: Secondary | ICD-10-CM | POA: Diagnosis not present

## 2019-08-22 NOTE — Telephone Encounter (Signed)
Disregard - Patient decided to make appt instead of sending message.

## 2019-08-24 ENCOUNTER — Ambulatory Visit (INDEPENDENT_AMBULATORY_CARE_PROVIDER_SITE_OTHER): Payer: Medicare Other | Admitting: Family Medicine

## 2019-08-24 ENCOUNTER — Encounter: Payer: Self-pay | Admitting: Family Medicine

## 2019-08-24 ENCOUNTER — Ambulatory Visit (INDEPENDENT_AMBULATORY_CARE_PROVIDER_SITE_OTHER): Payer: Medicare Other

## 2019-08-24 ENCOUNTER — Other Ambulatory Visit: Payer: Self-pay

## 2019-08-24 VITALS — BP 110/70 | HR 75 | Temp 97.8°F | Ht 69.0 in

## 2019-08-24 DIAGNOSIS — M19072 Primary osteoarthritis, left ankle and foot: Secondary | ICD-10-CM | POA: Diagnosis not present

## 2019-08-24 DIAGNOSIS — G2119 Other drug induced secondary parkinsonism: Secondary | ICD-10-CM | POA: Diagnosis not present

## 2019-08-24 DIAGNOSIS — L03032 Cellulitis of left toe: Secondary | ICD-10-CM

## 2019-08-24 DIAGNOSIS — M79601 Pain in right arm: Secondary | ICD-10-CM | POA: Diagnosis not present

## 2019-08-24 DIAGNOSIS — G8929 Other chronic pain: Secondary | ICD-10-CM | POA: Diagnosis not present

## 2019-08-24 DIAGNOSIS — I519 Heart disease, unspecified: Secondary | ICD-10-CM | POA: Diagnosis not present

## 2019-08-24 DIAGNOSIS — G40909 Epilepsy, unspecified, not intractable, without status epilepticus: Secondary | ICD-10-CM | POA: Diagnosis not present

## 2019-08-24 DIAGNOSIS — R42 Dizziness and giddiness: Secondary | ICD-10-CM | POA: Diagnosis not present

## 2019-08-24 DIAGNOSIS — E6609 Other obesity due to excess calories: Secondary | ICD-10-CM | POA: Diagnosis not present

## 2019-08-24 DIAGNOSIS — F319 Bipolar disorder, unspecified: Secondary | ICD-10-CM | POA: Diagnosis not present

## 2019-08-24 DIAGNOSIS — M797 Fibromyalgia: Secondary | ICD-10-CM | POA: Diagnosis not present

## 2019-08-24 DIAGNOSIS — Z6832 Body mass index (BMI) 32.0-32.9, adult: Secondary | ICD-10-CM | POA: Diagnosis not present

## 2019-08-24 DIAGNOSIS — L089 Local infection of the skin and subcutaneous tissue, unspecified: Secondary | ICD-10-CM | POA: Diagnosis not present

## 2019-08-24 DIAGNOSIS — E039 Hypothyroidism, unspecified: Secondary | ICD-10-CM | POA: Diagnosis not present

## 2019-08-24 DIAGNOSIS — M25321 Other instability, right elbow: Secondary | ICD-10-CM | POA: Diagnosis not present

## 2019-08-24 DIAGNOSIS — H409 Unspecified glaucoma: Secondary | ICD-10-CM | POA: Diagnosis not present

## 2019-08-24 DIAGNOSIS — K7460 Unspecified cirrhosis of liver: Secondary | ICD-10-CM | POA: Diagnosis not present

## 2019-08-24 DIAGNOSIS — Z87891 Personal history of nicotine dependence: Secondary | ICD-10-CM | POA: Diagnosis not present

## 2019-08-24 DIAGNOSIS — K219 Gastro-esophageal reflux disease without esophagitis: Secondary | ICD-10-CM | POA: Diagnosis not present

## 2019-08-24 DIAGNOSIS — F419 Anxiety disorder, unspecified: Secondary | ICD-10-CM | POA: Diagnosis not present

## 2019-08-24 DIAGNOSIS — M85872 Other specified disorders of bone density and structure, left ankle and foot: Secondary | ICD-10-CM | POA: Diagnosis not present

## 2019-08-24 DIAGNOSIS — Z950 Presence of cardiac pacemaker: Secondary | ICD-10-CM | POA: Diagnosis not present

## 2019-08-24 DIAGNOSIS — M150 Primary generalized (osteo)arthritis: Secondary | ICD-10-CM | POA: Diagnosis not present

## 2019-08-24 DIAGNOSIS — D6959 Other secondary thrombocytopenia: Secondary | ICD-10-CM | POA: Diagnosis not present

## 2019-08-24 DIAGNOSIS — Z483 Aftercare following surgery for neoplasm: Secondary | ICD-10-CM | POA: Diagnosis not present

## 2019-08-24 DIAGNOSIS — F200 Paranoid schizophrenia: Secondary | ICD-10-CM | POA: Diagnosis not present

## 2019-08-24 DIAGNOSIS — Z9049 Acquired absence of other specified parts of digestive tract: Secondary | ICD-10-CM | POA: Diagnosis not present

## 2019-08-24 DIAGNOSIS — D702 Other drug-induced agranulocytosis: Secondary | ICD-10-CM | POA: Diagnosis not present

## 2019-08-24 DIAGNOSIS — K754 Autoimmune hepatitis: Secondary | ICD-10-CM | POA: Diagnosis not present

## 2019-08-24 IMAGING — DX DG FOOT COMPLETE 3+V*L*
3 series · 3 of 3 positions shown · non-contrast
Comparison: None.

CLINICAL DATA: LEFT great toe infection.

EXAM:
LEFT FOOT - COMPLETE 3+ VIEW

[foot ap]
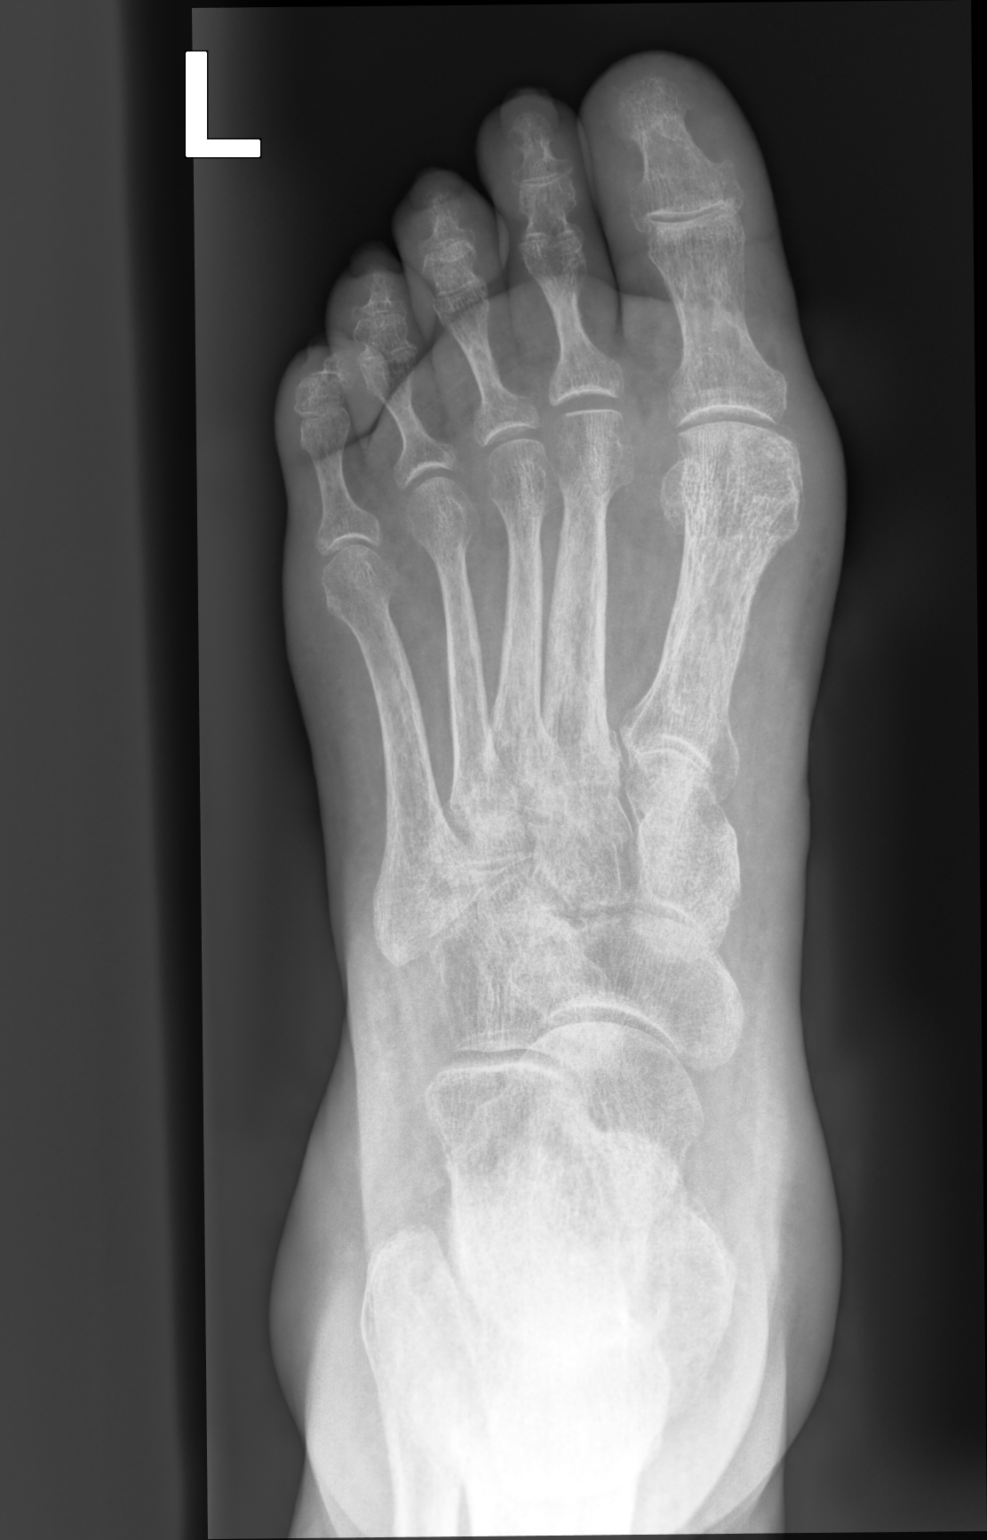

[foot mlo]
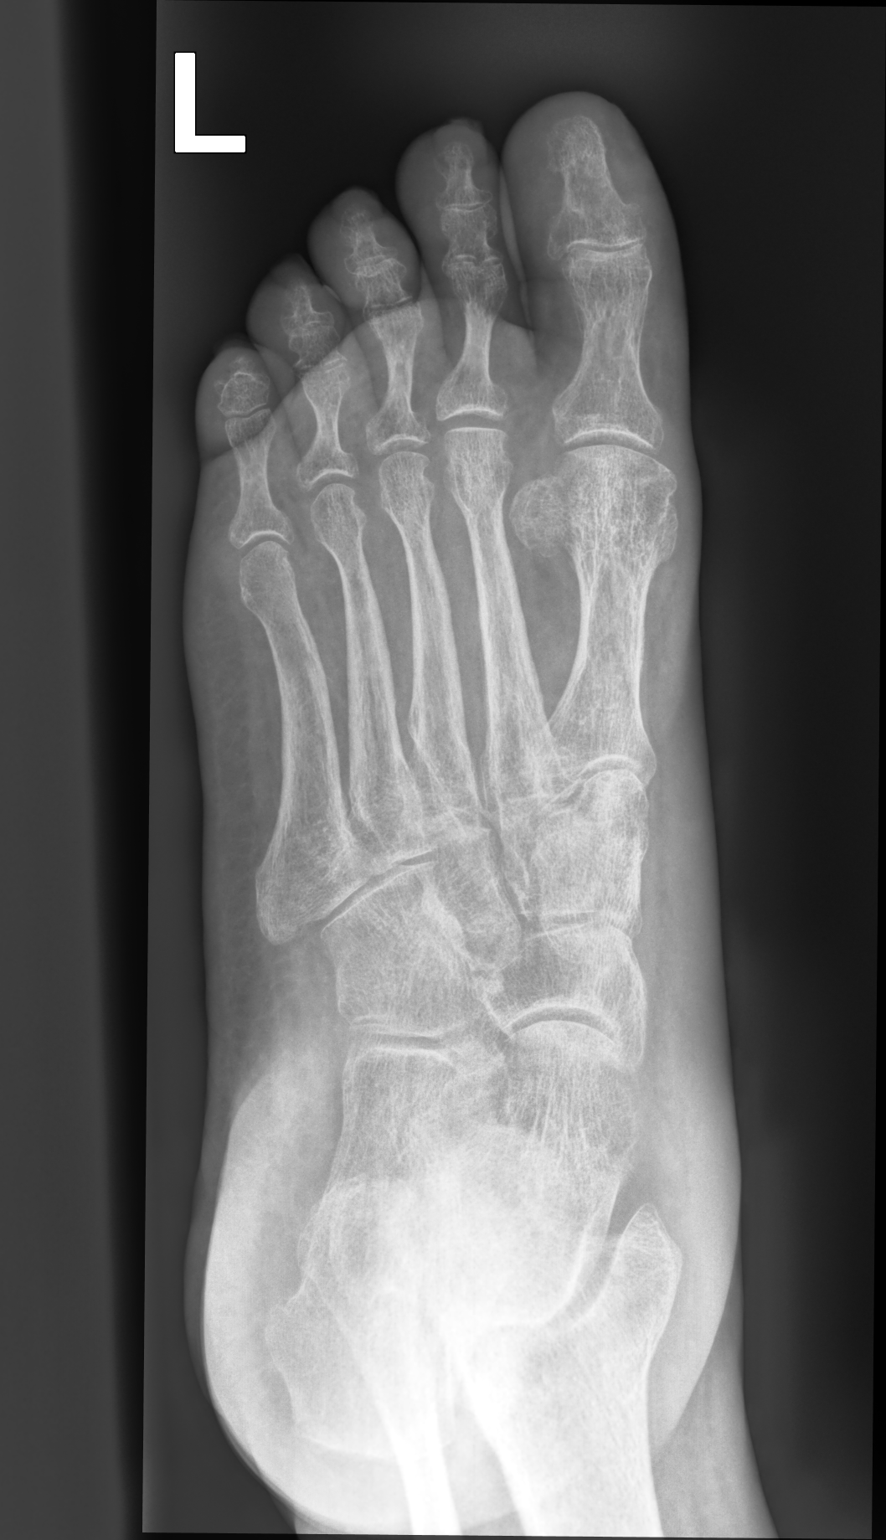

[foot lat]
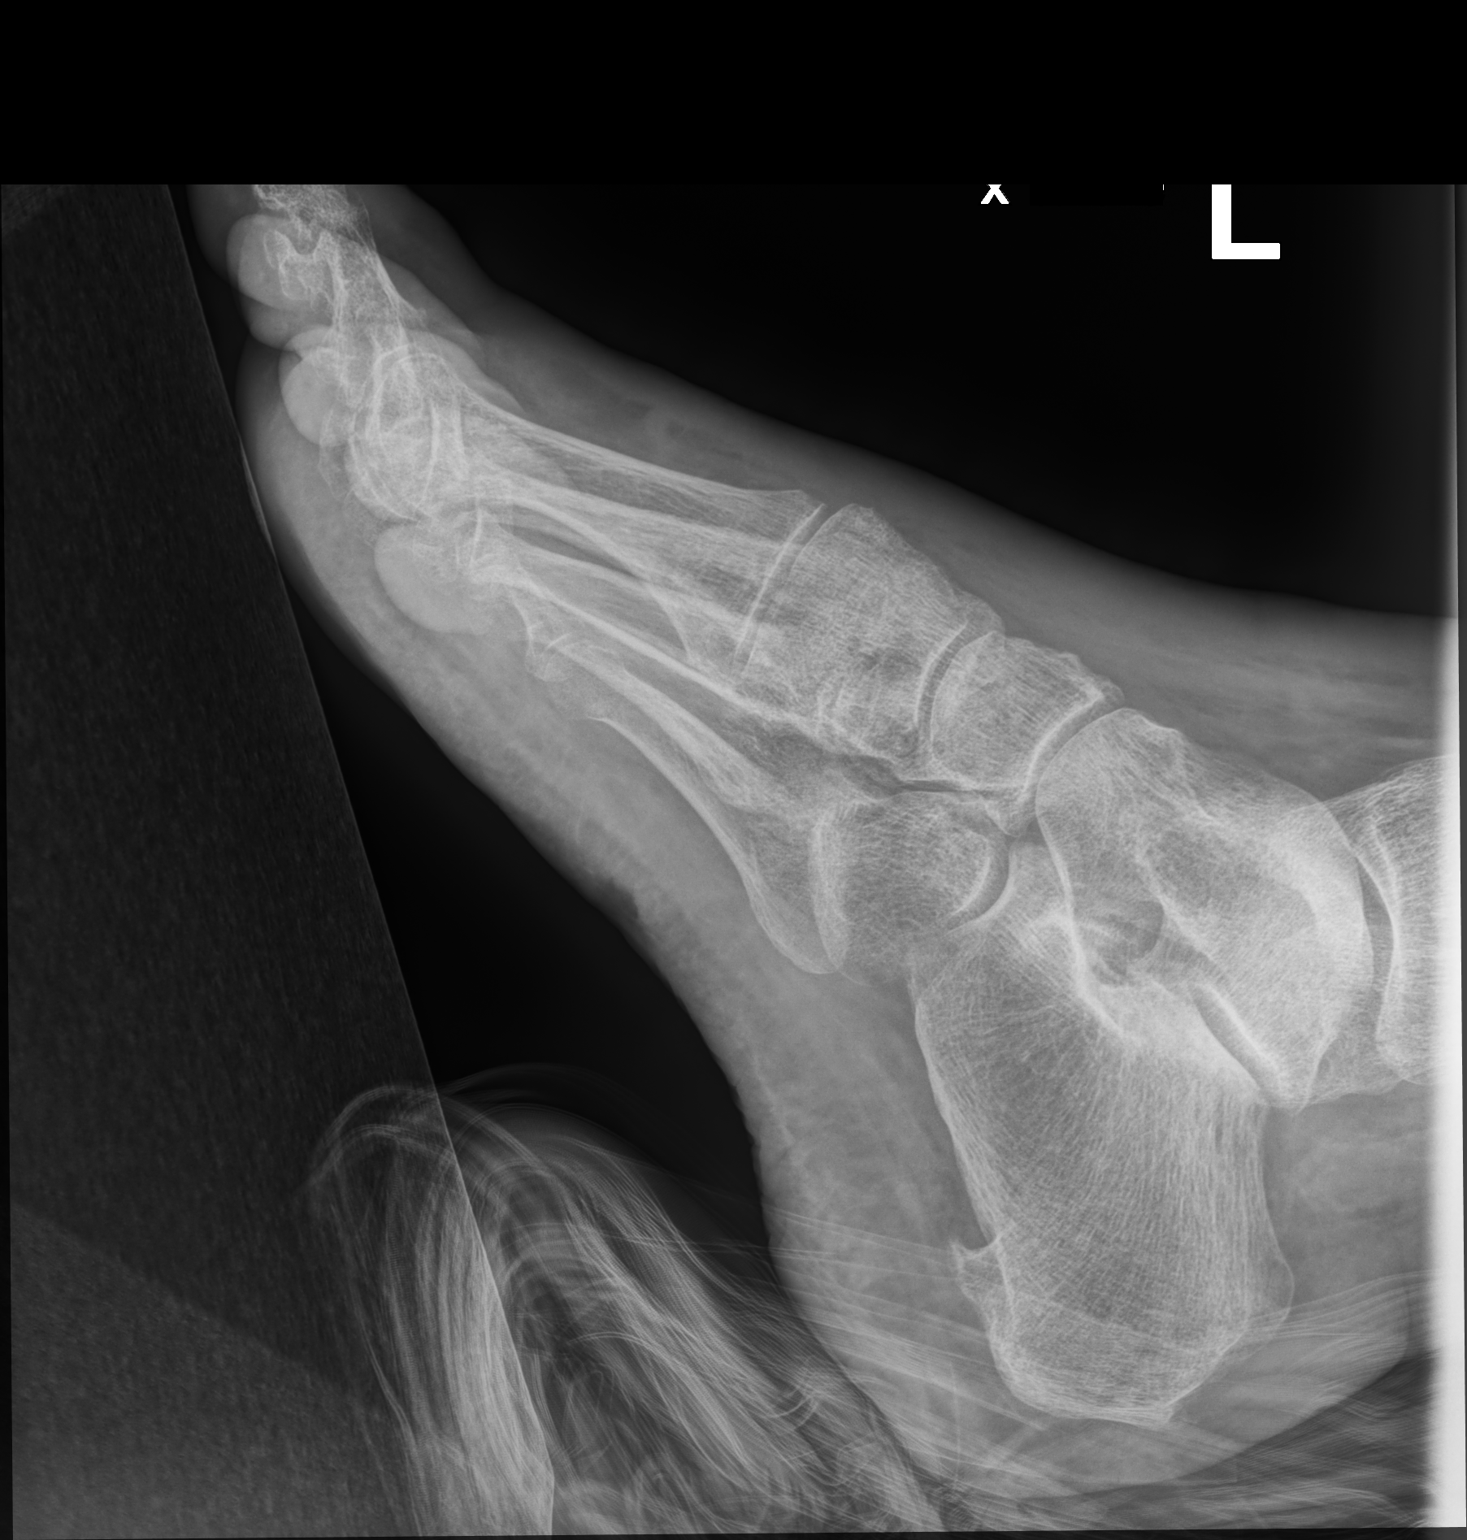

[3 of 3 positions shown; findings below may reference images not displayed]

FINDINGS: Osteopenia. No acute fracture or dislocation. Mild midfoot
degenerative changes. No area of erosion or osseous destruction. No
unexpected radiopaque foreign body. Soft tissues are unremarkable.
IMPRESSION: No radiographic evidence of osteomyelitis.

## 2019-08-24 MED ORDER — CEPHALEXIN 500 MG PO CAPS: 500.0000 mg | ORAL_CAPSULE | Freq: Two times a day (BID) | ORAL | 0 refills | Status: DC

## 2019-08-24 NOTE — Progress Notes (Signed)
Stacey Velazquez is a 74 y.o. female  Chief Complaint  Patient presents with  . Acute Visit    Pt c/o lt big toe nail pain.  Pt recently has been seen for this issue.  Pt took off bandage and bleeding begin.  pt also c/o sever buring sensation in both feet.    HPI: Stacey Velazquez is a 74 y.o. female patient of Dr. Ethelene Hal who complains of Lt great toe and toenail pain, swelling.  She states she was seen at [facility] in 02/2019 [facility]) and had toenails filed down. She states her Lt toenail had a growth ("layers of nail growing up") and this was filed off. She has been using vaseline and vics and bandaging on the toe since that time. She states she started to experience toe pain about 1 week ago. She took bandage off and toe was red, bleeding. She also noted a red area around her nail bed.  No fever, chills.   Past Medical History:  Diagnosis Date  . Adenomatous polyp of ascending colon   . Anxiety   . Bilateral lower extremity edema    burning sensations  . Bipolar 1 disorder ([place])   . Carpal tunnel syndrome   . Cirrhosis of liver ([place])   . Cutaneous horn   . Drug-induced Parkinsonism ([place])    patient unaware  . Esophageal varices ([place])   . Fibromyalgia    patient denies  . Gastric ulcer   . GERD (gastroesophageal reflux disease)   . Gingival abscess 08/26/2016  . Glaucoma 2003   pt unaware  . Heart attack ([place])   . Heart disease   . Hepatitis, autoimmune ([place]) 08/25/2014  . History of iron deficiency anemia   . Hypotension   . Hypothyroidism   . Migraines   . Neutropenia ([place])   . Osteoporosis   . Pacemaker Medtronic    MRI compatible  . Paranoid schizophrenia ([place])   . Polyarthritis   . Polyosteoarthritis   . Rectal mass   . Seizures ([place])    last sz 08/21/14  . Sinus arrest   . Skin cancer    forehead  . SSS (sick sinus syndrome) ([place])   . Stroke ([place])   . Thrombocytopenia ([place])   . Vertigo    bvvp    Past Surgical History:  Procedure  Laterality Date  . BIOPSY  03/06/2019   Procedure: BIOPSY;  Surgeon: Thornton Park, MD;  Location: [facility];  Service: Gastroenterology;;  EGD and COLON  . CATARACT EXTRACTION Left 2008   pt unaware  . CHOLECYSTECTOMY    . COLONOSCOPY WITH PROPOFOL N/A 03/06/2019   Procedure: COLONOSCOPY WITH PROPOFOL;  Surgeon: Thornton Park, MD;  [facility];  Service: Gastroenterology;  Laterality: N/A;  . ESOPHAGOGASTRODUODENOSCOPY (EGD) WITH PROPOFOL N/A 03/06/2019   Procedure: ESOPHAGOGASTRODUODENOSCOPY (EGD) WITH PROPOFOL ;  Surgeon: Thornton Park, MD;  [facility];  Service: Gastroenterology;  Laterality: N/A;  . KNEE SURGERY Left 1985  . PACEMAKER INSERTION  2014  . PARTIAL HYSTERECTOMY  1979  . PARTIAL PROCTECTOMY BY TEM N/A 05/10/2019   Procedure: TEM PARTIAL PROCTECTOMY OF RECTAL MASS WITH EXCISION RIGHT PERINEAL SKIN MASS;  Surgeon: Michael Boston, MD;  Location: [facility];  Service: General;  Laterality: N/A;  . POLYPECTOMY  03/06/2019   Procedure: POLYPECTOMY;  Surgeon: Thornton Park, MD;  [facility];  Service: Gastroenterology;;  . REFRACTIVE SURGERY    . TUBAL LIGATION  Social History   Socioeconomic History  . Marital status: Widowed    Spouse name: Not on file  . Number of children: Not on file  . Years of education: Not on file  . Highest education level: Not on file  Occupational History  . Not on file  Tobacco Use  . Smoking status: Former Smoker    Quit date: 03/02/2003    Years since quitting: 16.4  . Smokeless tobacco: Never Used  Vaping Use  . Vaping Use: Never used  Substance and Sexual Activity  . Alcohol use: No  . Drug use: No  . Sexual activity: Not on file  Other Topics Concern  . Not on file  Social History Narrative   Lives alone, "have friends and relatives that come and help me"   caffeine use- coffee -1 cup daily   Social Determinants of Health   Financial Resource Strain: Low Risk   . Difficulty of  Paying Living Expenses: Not very hard  Food Insecurity: No Food Insecurity  . Worried About Charity fundraiser in the Last Year: Never true  . Ran Out of Food in the Last Year: Never true  Transportation Needs: Unmet Transportation Needs  . Lack of Transportation (Medical): Yes  . Lack of Transportation (Non-Medical): Yes  Physical Activity: Inactive  . Days of Exercise per Week: 0 days  . Minutes of Exercise per Session: 0 min  Stress: Stress Concern Present  . Feeling of Stress : To some extent  Social Connections: Moderately Isolated  . Frequency of Communication with Friends and Family: More than three times a week  . Frequency of Social Gatherings with Friends and Family: More than three times a week  . Attends Religious Services: 1 to 4 times per year  . Active Member of Clubs or Organizations: No  . Attends Archivist Meetings: Never  . Marital Status: Widowed  Intimate Partner Violence: Not At Risk  . Fear of Current or Ex-Partner: No  . Emotionally Abused: No  . Physically Abused: No  . Sexually Abused: No    Family History  Problem Relation Age of Onset  . Heart attack Father   . Heart disease Paternal Uncle   . Heart disease Paternal Grandmother   . Diabetes Paternal Grandmother   . Heart disease Paternal Grandfather   . Breast cancer Mother   . Cancer Maternal Grandmother   . CVA Maternal Grandmother   . Cancer Maternal Grandfather   . CVA Maternal Grandfather   . Diabetes Sister   . Diabetes Brother   . Diabetes Brother   . Stroke Daughter      Immunization History  Administered Date(s) Administered  . Influenza,inj,Quad PF,6+ Mos 10/29/2014, 01/12/2017  . PFIZER SARS-COV-2 Vaccination 07/31/2019, 08/21/2019  . PPD Test 01/15/2013  . Pneumococcal Conjugate-13 04/18/2017  . Tdap 04/18/2017    Outpatient Encounter Medications as of 08/24/2019  Medication Sig  . azaTHIOprine (IMURAN) 50 MG tablet TAKE TWO TABLETS (100 MG TOTAL) BY MOUTH  DAILY.  Marland Kitchen levothyroxine (SYNTHROID) 75 MCG tablet Take 1 tablet (75 mcg total) by mouth daily before breakfast.  . omeprazole (PRILOSEC) 20 MG capsule Take 40 mg by mouth daily before breakfast.   . tamsulosin (FLOMAX) 0.4 MG CAPS capsule Take 0.4 mg by mouth daily.  . cephALEXin (KEFLEX) 500 MG capsule Take 1 capsule (500 mg total) by mouth 2 (two) times daily for 10 days.   No facility-administered encounter medications on file as of 08/24/2019.  ROS: Pertinent positives and negatives noted in HPI. Remainder of ROS non-contributory    Allergies  Allergen Reactions  . Neomycin Nausea And Vomiting  . Ambien [Zolpidem Tartrate] Other (See Comments)    Hallucinations   . Amitriptyline Other (See Comments)    Stops heart per pt  . Ampicillin Other (See Comments)    Stops heart per pt   . Anaprox [Naproxen Sodium] Other (See Comments)    Doesn't work per pt  . Benadryl [Diphenhydramine Hcl (Sleep)] Other (See Comments)    Rash  . Cetirizine & Related Other (See Comments)    Swelling   . Cortizone-10 [Hydrocortisone] Other (See Comments)    seizures   . Darvon [Propoxyphene] Other (See Comments)    Increased heart rate per pt   . Diazepam Other (See Comments)    Stops heart per pt  . Diflunisal Swelling and Other (See Comments)    Stops heart per pt (Dolobid)  . Duloxetine Other (See Comments)    Reaction to Cymbalta - pt doesn't remember what the reaction was  . Flexeril [Cyclobenzaprine] Other (See Comments)    Seizures   . Lactose Intolerance (Gi) Swelling and Other (See Comments)    cramping  . Lidocaine Other (See Comments)    Seizures  . Meperidine And Related Other (See Comments)    Stops heart rate (reaction to Demerol)  . Metanx [L-Methylfolate-Algae-B12-B6] Other (See Comments)    Pt does not remember reaction  . Metoclopramide Other (See Comments)    Pt does not remember reaction  . Morphine And Related Other (See Comments)    Stops heart per pt   .  Nuprin [Ibuprofen] Other (See Comments)    Caused headache  . Oxycodone Other (See Comments)    Hallucinations   . Penicillins Other (See Comments)    Stops heart per pt   . Propoxyphene Other (See Comments)    Slowed heart rate per pt (reaction to Darvocet)  . Ranitidine Other (See Comments)    Pt does not remember reaction  . Vicodin [Hydrocodone-Acetaminophen] Other (See Comments)    Seizures   . Tylenol [Acetaminophen] Rash    BP 110/70 (BP Location: Right Arm, Patient Position: Sitting, Cuff Size: Large)   Pulse 75   Temp 97.8 F (36.6 C) (Temporal)   Ht 5\' 9"  (1.753 m)   SpO2 97%   BMI 31.16 kg/m   Physical Exam Musculoskeletal:     Left foot: Swelling and tenderness present.       Legs:  Neurological:     Mental Status: Mental status is at baseline.      A/P:  1. Infection of nail bed of toe of left foot - toe is swollen, erythematous with increased erythema at base of nail, tender to touch Rx: - cephALEXin (KEFLEX) 500 MG capsule; Take 1 capsule (500 mg total) by mouth 2 (two) times daily for 10 days.  Dispense: 20 capsule; Refill: 0 - DG Foot Complete Left - r/o osteo - f/u in 1 week or sooner PRN    This visit occurred during the SARS-CoV-2 public health emergency.  Safety protocols were in place, including screening questions prior to the visit, additional usage of staff PPE, and extensive cleaning of exam room while observing appropriate contact time as indicated for disinfecting solutions.

## 2019-08-28 ENCOUNTER — Other Ambulatory Visit: Payer: Self-pay

## 2019-08-29 ENCOUNTER — Ambulatory Visit (INDEPENDENT_AMBULATORY_CARE_PROVIDER_SITE_OTHER): Payer: Medicare Other | Admitting: Family Medicine

## 2019-08-29 ENCOUNTER — Encounter: Payer: Self-pay | Admitting: Family Medicine

## 2019-08-29 VITALS — BP 120/70 | HR 70 | Temp 97.8°F | Ht 69.0 in

## 2019-08-29 DIAGNOSIS — L03032 Cellulitis of left toe: Secondary | ICD-10-CM | POA: Diagnosis not present

## 2019-08-29 NOTE — Progress Notes (Signed)
Stacey Velazquez is a 74 y.o. female  Chief Complaint  Patient presents with  . Follow-up    Follow up visit for infection big toes    HPI: Stacey Velazquez is a 74 y.o. female here for f/u on Lt great toe infection that pt first noticed a few weeks ago but she felt is d/t podiatry visit in 02/2019 where her toenail was mechanically filed down. I saw her on 08/24/19 and started her on keflex. Xray was negative for signs of osteomyelitis.  Today pt states she thinks the redness is improving. She still has "throbbing" but not as bad. She complains of numbness in toes and feet - not new.  No fever, chills.   Past Medical History:  Diagnosis Date  . Adenomatous polyp of ascending colon   . Anxiety   . Bilateral lower extremity edema    burning sensations  . Bipolar 1 disorder ([place])   . Carpal tunnel syndrome   . Cirrhosis of liver ([place])   . Cutaneous horn   . Drug-induced Parkinsonism ([place])    patient unaware  . Esophageal varices ([place])   . Fibromyalgia    patient denies  . Gastric ulcer   . GERD (gastroesophageal reflux disease)   . Gingival abscess 08/26/2016  . Glaucoma 2003   pt unaware  . Heart attack ([place])   . Heart disease   . Hepatitis, autoimmune ([place]) 08/25/2014  . History of iron deficiency anemia   . Hypotension   . Hypothyroidism   . Migraines   . Neutropenia ([place])   . Osteoporosis   . Pacemaker Medtronic    MRI compatible  . Paranoid schizophrenia ([place])   . Polyarthritis   . Polyosteoarthritis   . Rectal mass   . Seizures ([place])    last sz 08/21/14  . Sinus arrest   . Skin cancer    forehead  . SSS (sick sinus syndrome) ([place])   . Stroke ([place])   . Thrombocytopenia ([place])   . Vertigo    bvvp    Past Surgical History:  Procedure Laterality Date  . BIOPSY  03/06/2019   Procedure: BIOPSY;  Surgeon: Thornton Park, MD;  [facility];  Service: Gastroenterology;;  EGD and COLON  . CATARACT EXTRACTION Left 2008   pt unaware  . CHOLECYSTECTOMY      . COLONOSCOPY WITH PROPOFOL N/A 03/06/2019   Procedure: COLONOSCOPY WITH PROPOFOL;  Surgeon: Thornton Park, MD;  [facility];  Service: Gastroenterology;  Laterality: N/A;  . ESOPHAGOGASTRODUODENOSCOPY (EGD) WITH PROPOFOL N/A 03/06/2019   Procedure: ESOPHAGOGASTRODUODENOSCOPY (EGD) WITH PROPOFOL ;  Surgeon: Thornton Park, MD;  [facility];  Service: Gastroenterology;  Laterality: N/A;  . KNEE SURGERY Left 1985  . PACEMAKER INSERTION  2014  . PARTIAL HYSTERECTOMY  1979  . PARTIAL PROCTECTOMY BY TEM N/A 05/10/2019   Procedure: TEM PARTIAL PROCTECTOMY OF RECTAL MASS WITH EXCISION RIGHT PERINEAL SKIN MASS;  Surgeon: Michael Boston, MD;  [facility];  Service: General;  Laterality: N/A;  . POLYPECTOMY  03/06/2019   Procedure: POLYPECTOMY;  Surgeon: Thornton Park, MD;  [facility];  Service: Gastroenterology;;  . REFRACTIVE SURGERY    . TUBAL LIGATION      Social History   Socioeconomic History  . Marital status: Widowed    Spouse name: Not on file  . Number of children: Not on file  . Years of education: Not on file  . Highest education level: Not on file  Occupational History  .  Not on file  Tobacco Use  . Smoking status: Former Smoker    Quit date: 03/02/2003    Years since quitting: 16.5  . Smokeless tobacco: Never Used  Vaping Use  . Vaping Use: Never used  Substance and Sexual Activity  . Alcohol use: No  . Drug use: No  . Sexual activity: Not on file  Other Topics Concern  . Not on file  Social History Narrative   Lives alone, "have friends and relatives that come and help me"   caffeine use- coffee -1 cup daily   Social Determinants of Health   Financial Resource Strain: Low Risk   . Difficulty of Paying Living Expenses: Not very hard  Food Insecurity: No Food Insecurity  . Worried About Charity fundraiser in the Last Year: Never true  . Ran Out of Food in the Last Year: Never true  Transportation Needs: Unmet  Transportation Needs  . Lack of Transportation (Medical): Yes  . Lack of Transportation (Non-Medical): Yes  Physical Activity: Inactive  . Days of Exercise per Week: 0 days  . Minutes of Exercise per Session: 0 min  Stress: Stress Concern Present  . Feeling of Stress : To some extent  Social Connections: Moderately Isolated  . Frequency of Communication with Friends and Family: More than three times a week  . Frequency of Social Gatherings with Friends and Family: More than three times a week  . Attends Religious Services: 1 to 4 times per year  . Active Member of Clubs or Organizations: No  . Attends Archivist Meetings: Never  . Marital Status: Widowed  Intimate Partner Violence: Not At Risk  . Fear of Current or Ex-Partner: No  . Emotionally Abused: No  . Physically Abused: No  . Sexually Abused: No    Family History  Problem Relation Age of Onset  . Heart attack Father   . Heart disease Paternal Uncle   . Heart disease Paternal Grandmother   . Diabetes Paternal Grandmother   . Heart disease Paternal Grandfather   . Breast cancer Mother   . Cancer Maternal Grandmother   . CVA Maternal Grandmother   . Cancer Maternal Grandfather   . CVA Maternal Grandfather   . Diabetes Sister   . Diabetes Brother   . Diabetes Brother   . Stroke Daughter      Immunization History  Administered Date(s) Administered  . Influenza,inj,Quad PF,6+ Mos 10/29/2014, 01/12/2017  . PFIZER SARS-COV-2 Vaccination 07/31/2019, 08/21/2019  . PPD Test 01/15/2013  . Pneumococcal Conjugate-13 04/18/2017  . Tdap 04/18/2017    Outpatient Encounter Medications as of 08/29/2019  Medication Sig  . azaTHIOprine (IMURAN) 50 MG tablet TAKE TWO TABLETS (100 MG TOTAL) BY MOUTH DAILY.  . cephALEXin (KEFLEX) 500 MG capsule Take 1 capsule (500 mg total) by mouth 2 (two) times daily for 10 days.  Marland Kitchen levothyroxine (SYNTHROID) 75 MCG tablet Take 1 tablet (75 mcg total) by mouth daily before breakfast.  .  omeprazole (PRILOSEC) 20 MG capsule Take 40 mg by mouth daily before breakfast.   . tamsulosin (FLOMAX) 0.4 MG CAPS capsule Take 0.4 mg by mouth daily.   No facility-administered encounter medications on file as of 08/29/2019.     ROS: Pertinent positives and negatives noted in HPI. Remainder of ROS non-contributory    Allergies  Allergen Reactions  . Neomycin Nausea And Vomiting  . Ambien [Zolpidem Tartrate] Other (See Comments)    Hallucinations   . Amitriptyline Other (See Comments)  Stops heart per pt  . Ampicillin Other (See Comments)    Stops heart per pt   . Anaprox [Naproxen Sodium] Other (See Comments)    Doesn't work per pt  . Benadryl [Diphenhydramine Hcl (Sleep)] Other (See Comments)    Rash  . Cetirizine & Related Other (See Comments)    Swelling   . Cortizone-10 [Hydrocortisone] Other (See Comments)    seizures   . Darvon [Propoxyphene] Other (See Comments)    Increased heart rate per pt   . Diazepam Other (See Comments)    Stops heart per pt  . Diflunisal Swelling and Other (See Comments)    Stops heart per pt (Dolobid)  . Duloxetine Other (See Comments)    Reaction to Cymbalta - pt doesn't remember what the reaction was  . Flexeril [Cyclobenzaprine] Other (See Comments)    Seizures   . Lactose Intolerance (Gi) Swelling and Other (See Comments)    cramping  . Lidocaine Other (See Comments)    Seizures  . Meperidine And Related Other (See Comments)    Stops heart rate (reaction to Demerol)  . Metanx [L-Methylfolate-Algae-B12-B6] Other (See Comments)    Pt does not remember reaction  . Metoclopramide Other (See Comments)    Pt does not remember reaction  . Morphine And Related Other (See Comments)    Stops heart per pt   . Nuprin [Ibuprofen] Other (See Comments)    Caused headache  . Oxycodone Other (See Comments)    Hallucinations   . Penicillins Other (See Comments)    Stops heart per pt   . Propoxyphene Other (See Comments)    Slowed  heart rate per pt (reaction to Darvocet)  . Ranitidine Other (See Comments)    Pt does not remember reaction  . Vicodin [Hydrocodone-Acetaminophen] Other (See Comments)    Seizures   . Tylenol [Acetaminophen] Rash    BP 120/70 (BP Location: Right Arm, Patient Position: Sitting, Cuff Size: Normal)   Pulse 70   Temp 97.8 F (36.6 C) (Temporal)   Ht 5\' 9"  (1.753 m)   SpO2 99%   BMI 31.16 kg/m   Physical Exam Constitutional:      General: She is not in acute distress.    Appearance: She is not ill-appearing or toxic-appearing.  Musculoskeletal:     Left foot: Bunion present.  Feet:     Left foot:     Skin integrity: Erythema present. No ulcer, blister or warmth.     Comments: Toenail is worn down, appears white, rough Neurological:     Mental Status: She is alert and oriented to person, place, and time. Mental status is at baseline.      A/P:  1. Infection of nail bed of toe of left foot - improving but not resolved - complete 10 day course of keflex - Ambulatory referral to Podiatry - f/u PRN   This visit occurred during the SARS-CoV-2 public health emergency.  Safety protocols were in place, including screening questions prior to the visit, additional usage of staff PPE, and extensive cleaning of exam room while observing appropriate contact time as indicated for disinfecting solutions.

## 2019-08-30 ENCOUNTER — Telehealth: Payer: Self-pay | Admitting: Family Medicine

## 2019-08-30 DIAGNOSIS — M79601 Pain in right arm: Secondary | ICD-10-CM | POA: Diagnosis not present

## 2019-08-30 DIAGNOSIS — M150 Primary generalized (osteo)arthritis: Secondary | ICD-10-CM | POA: Diagnosis not present

## 2019-08-30 DIAGNOSIS — G8929 Other chronic pain: Secondary | ICD-10-CM | POA: Diagnosis not present

## 2019-08-30 DIAGNOSIS — Z483 Aftercare following surgery for neoplasm: Secondary | ICD-10-CM | POA: Diagnosis not present

## 2019-08-30 DIAGNOSIS — F419 Anxiety disorder, unspecified: Secondary | ICD-10-CM | POA: Diagnosis not present

## 2019-08-30 DIAGNOSIS — M797 Fibromyalgia: Secondary | ICD-10-CM | POA: Diagnosis not present

## 2019-08-30 NOTE — Telephone Encounter (Signed)
Patient is calling and requesting a refill for Keflex sent to [facility]. Also patient is calling and stated that she was referred to [facility] to see Dr. Milinda Pointer by Dr. Loletha Grayer but the first available appointment was on 8/26. Patient wanted to see if she should wait or needed to be referred to another specialist, please advise. CB is [phone]

## 2019-08-30 NOTE — Telephone Encounter (Signed)
Please see message and advise 

## 2019-08-30 NOTE — Telephone Encounter (Signed)
Pt can see another provider in that office - Dr. Jacqualyn Posey, Dr. March Rummage, Dr. Valentina Lucks - so she can possibly be seen sooner. Complete current course of abx and I would like to wait before reordering abx

## 2019-08-31 ENCOUNTER — Ambulatory Visit (HOSPITAL_COMMUNITY)
Admission: RE | Admit: 2019-08-31 | Discharge: 2019-08-31 | Disposition: A | Discharge status: Home / Self Care | Payer: Medicare Other | Source: Ambulatory Visit | Attending: Gastroenterology | Admitting: Gastroenterology

## 2019-08-31 ENCOUNTER — Other Ambulatory Visit: Payer: Self-pay

## 2019-08-31 ENCOUNTER — Ambulatory Visit (HOSPITAL_COMMUNITY)
Admission: RE | Admit: 2019-08-31 | Discharge: 2019-08-31 | Disposition: A | Discharge status: Home / Self Care | Payer: Medicare Other | Source: Ambulatory Visit | Attending: Physician Assistant | Admitting: Physician Assistant

## 2019-08-31 ENCOUNTER — Telehealth: Payer: Self-pay

## 2019-08-31 DIAGNOSIS — Z950 Presence of cardiac pacemaker: Secondary | ICD-10-CM | POA: Insufficient documentation

## 2019-08-31 DIAGNOSIS — K862 Cyst of pancreas: Secondary | ICD-10-CM | POA: Diagnosis not present

## 2019-08-31 DIAGNOSIS — I8500 Esophageal varices without bleeding: Secondary | ICD-10-CM | POA: Insufficient documentation

## 2019-08-31 DIAGNOSIS — K625 Hemorrhage of anus and rectum: Secondary | ICD-10-CM | POA: Diagnosis not present

## 2019-08-31 DIAGNOSIS — K621 Rectal polyp: Secondary | ICD-10-CM

## 2019-08-31 DIAGNOSIS — K7469 Other cirrhosis of liver: Secondary | ICD-10-CM

## 2019-08-31 DIAGNOSIS — K754 Autoimmune hepatitis: Secondary | ICD-10-CM

## 2019-08-31 DIAGNOSIS — K259 Gastric ulcer, unspecified as acute or chronic, without hemorrhage or perforation: Secondary | ICD-10-CM

## 2019-08-31 DIAGNOSIS — K7460 Unspecified cirrhosis of liver: Secondary | ICD-10-CM | POA: Diagnosis not present

## 2019-08-31 DIAGNOSIS — Z975 Presence of (intrauterine) contraceptive device: Secondary | ICD-10-CM | POA: Diagnosis not present

## 2019-08-31 DIAGNOSIS — I864 Gastric varices: Secondary | ICD-10-CM | POA: Diagnosis not present

## 2019-08-31 IMAGING — MR MR ABDOMEN WO/W CM
19 series · 48 of 48 positions shown · IV contrast (Gadavist)
Comparison: [DATE] CT abdomen/pelvis.

CLINICAL DATA: Autoimmune hepatitis. Cirrhosis. Screening
evaluation.

EXAM:
MRI ABDOMEN WITHOUT AND WITH CONTRAST
TECHNIQUE: Multiplanar multisequence MR imaging of the abdomen was performed
both before and after the administration of intravenous contrast.
CONTRAST:  8mL GADAVIST GADOBUTROL 1 MMOL/ML IV SOLN

[Series 5: cor haste · coronal · 6.0mm · 1.19mm/px · 2 of 35 slices shown]
[im 1/35]
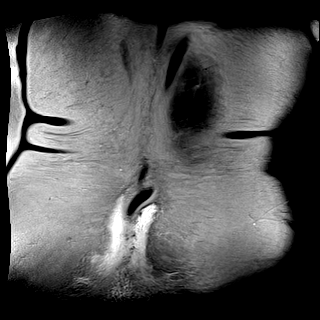
[im 35/35]
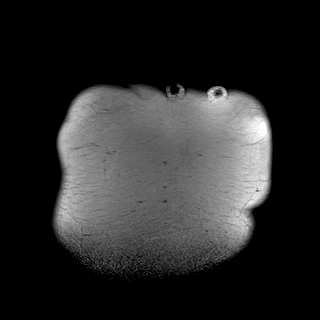

[Series 6: ax haste · axial · 6.0mm · 1.19mm/px · z∈[-131,+77]mm · 2 of 30 slices shown]
[im 1/30]
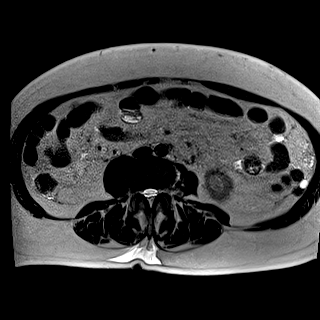
[im 30/30]
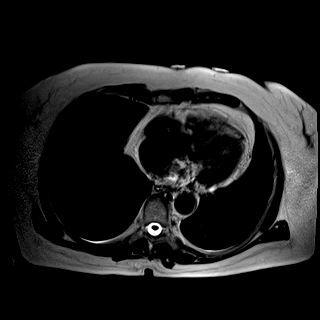

[Series 9: T2 fat-sat · axial · 6.0mm · 1.19mm/px · 1 of 30 slices shown]
[im 1/30]
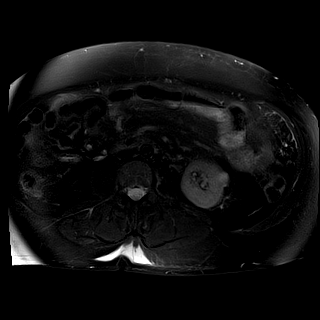

[Series 10: DWI · axial · 6.0mm · 1.42mm/px · z∈[-114,+95]mm · 4 of 90 slices shown (1 of 2)]
[im 1/90]
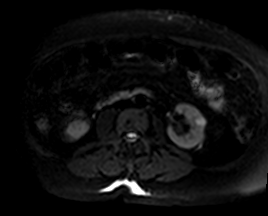
[im 30/90]
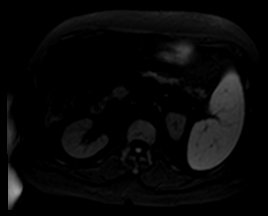
[im 60/90]
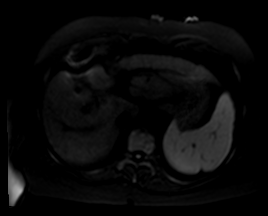
[im 90/90]
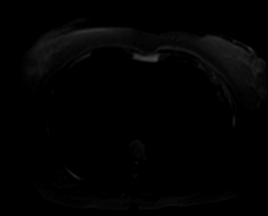

[Series 11: DWI · axial · 6.0mm · 1.42mm/px · 1 of 30 slices shown (2 of 2)]
[im 1/30]
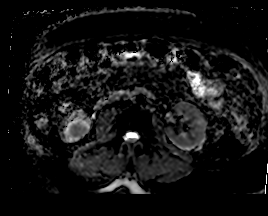

[Series 12: t2_tse_fs_tra_p2_mbh_320 · axial · 6.0mm · 1.19mm/px · 1 of 30 slices shown]
[im 1/30]
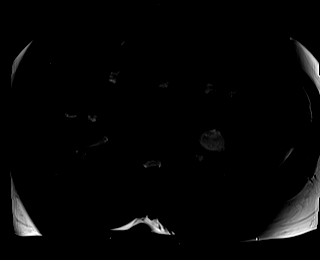

[Series 13: bSSFP · axial · 6.0mm · 0.74mm/px · 1 of 30 slices shown]
[im 1/30]
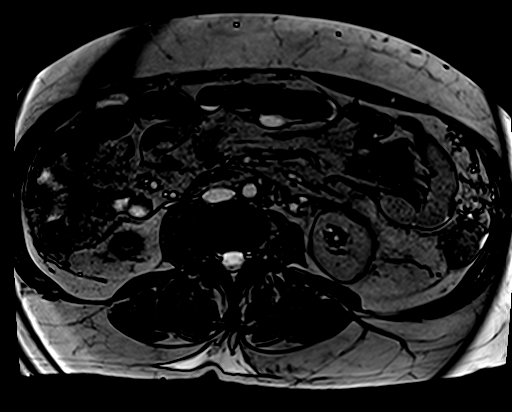

[Series 14: t1_vibe_opp-in_tra_p4_bh · axial · 3.0mm · 1.19mm/px · z∈[-128,+85]mm · 3 of 72 slices shown (1 of 2)]
[im 1/72]
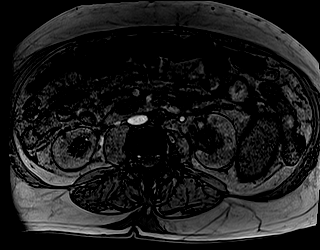
[im 36/72]
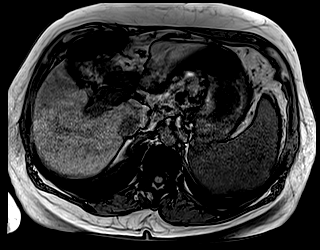
[im 72/72]
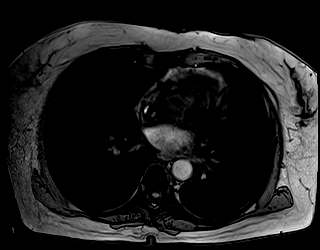

[Series 14: t1_vibe_opp-in_tra_p4_bh · axial · 3.0mm · 1.19mm/px · z∈[-128,+85]mm · 3 of 72 slices shown (2 of 2)]
[im 1/72]
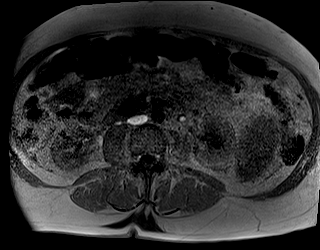
[im 36/72]
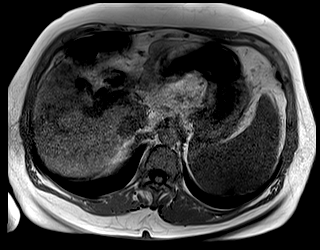
[im 72/72]
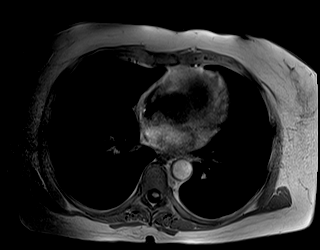

[Series 16: t1_vibe_fs_tra_p4_bh_pre · axial · 3.0mm · 1.19mm/px · z∈[-138,+74]mm · 3 of 72 slices shown]
[im 1/72]
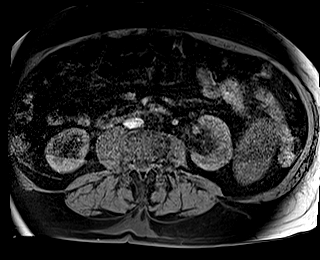
[im 36/72]
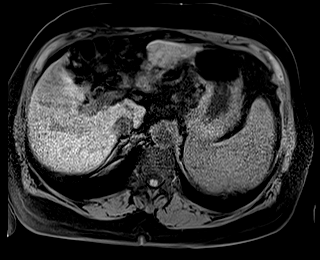
[im 72/72]
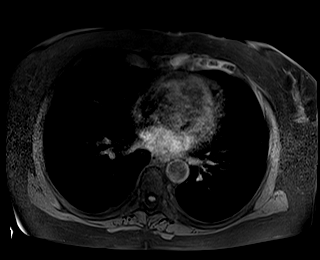

[Series 18: t1_vibe_fs_tra_p4_bh_post · axial · 3.0mm · 1.19mm/px · z∈[-138,+74]mm · 3 of 72 slices shown (1 of 4)]
[im 1/72]
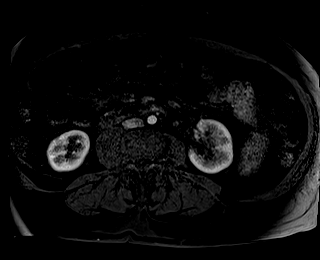
[im 36/72]
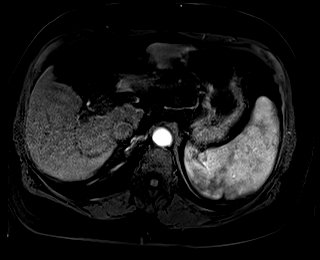
[im 72/72]
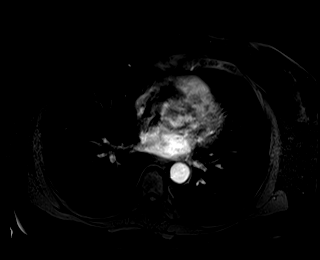

[Series 19: t1_vibe_fs_tra_p4_bh_post_sub · axial · 3.0mm · 1.19mm/px · z∈[-138,+74]mm · 3 of 72 slices shown (1 of 4)]
[im 1/72]
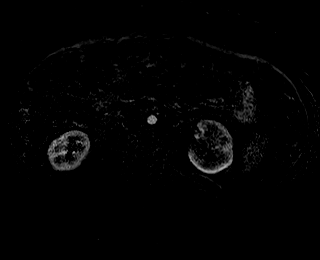
[im 36/72]
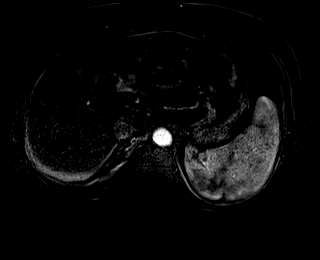
[im 72/72]
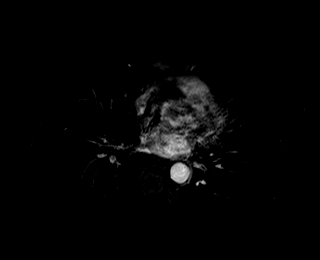

[Series 20: t1_vibe_fs_tra_p4_bh_post · axial · 3.0mm · 1.19mm/px · z∈[-138,+74]mm · 3 of 72 slices shown (2 of 4)]
[im 1/72]
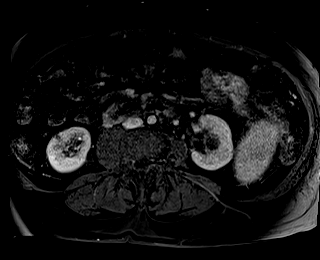
[im 36/72]
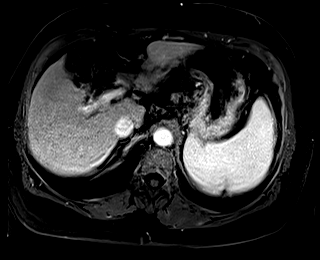
[im 72/72]
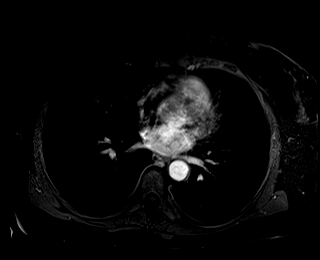

[Series 21: t1_vibe_fs_tra_p4_bh_post_sub · axial · 3.0mm · 1.19mm/px · z∈[-138,+74]mm · 3 of 72 slices shown (2 of 4)]
[im 1/72]
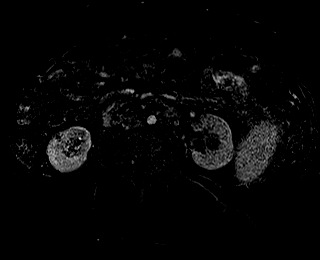
[im 36/72]
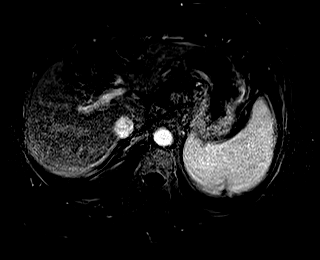
[im 72/72]
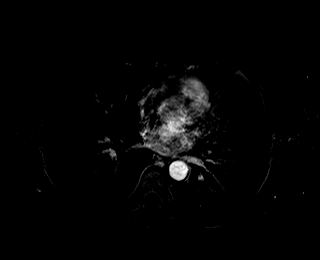

[Series 22: t1_vibe_fs_tra_p4_bh_post · axial · 3.0mm · 1.19mm/px · z∈[-138,+74]mm · 3 of 72 slices shown (3 of 4)]
[im 1/72]
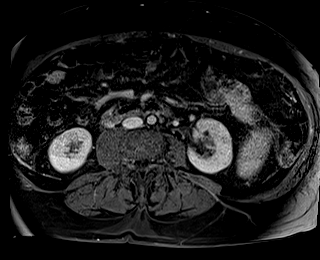
[im 36/72]
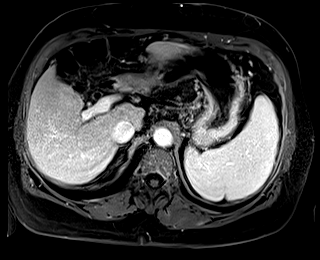
[im 72/72]
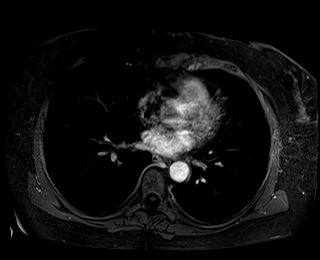

[Series 23: t1_vibe_fs_tra_p4_bh_post_sub · axial · 3.0mm · 1.19mm/px · z∈[-138,+74]mm · 3 of 72 slices shown (3 of 4)]
[im 1/72]
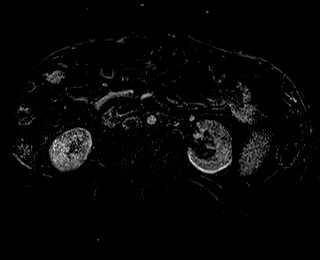
[im 36/72]
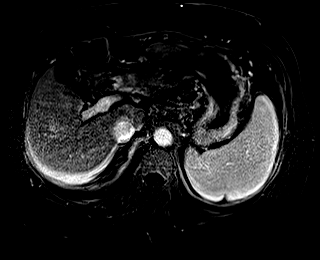
[im 72/72]
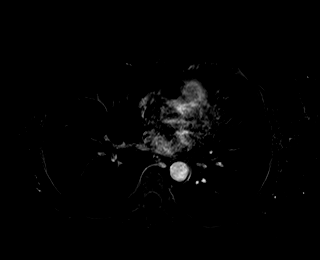

[Series 24: t1_vibe_fs_tra_p4_bh_post · axial · 3.0mm · 1.19mm/px · z∈[-138,+74]mm · 3 of 72 slices shown (4 of 4)]
[im 1/72]
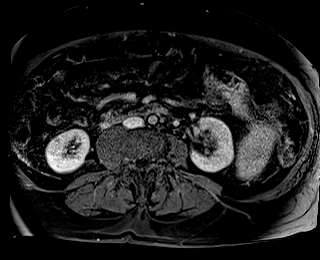
[im 36/72]
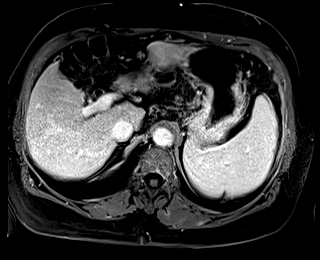
[im 72/72]
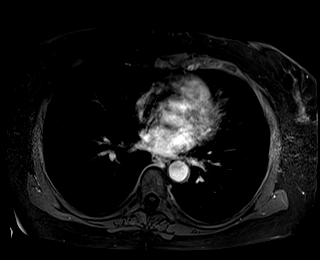

[Series 25: t1_vibe_fs_tra_p4_bh_post_sub · axial · 3.0mm · 1.19mm/px · z∈[-138,+74]mm · 3 of 72 slices shown (4 of 4)]
[im 1/72]
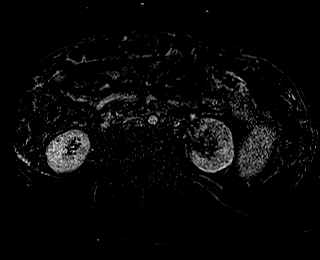
[im 36/72]
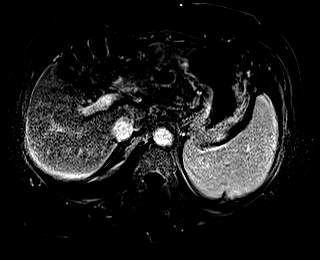
[im 72/72]
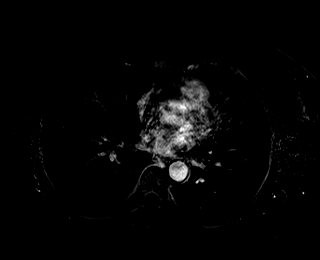

[Series 26: T1 dynamic post-contrast · coronal · 3.0mm · 1.31mm/px · 3 of 72 slices shown]
[im 1/72]
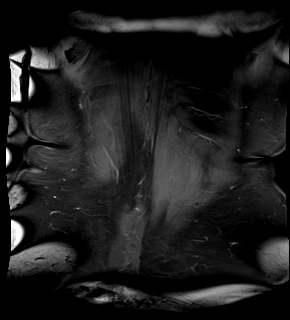
[im 36/72]
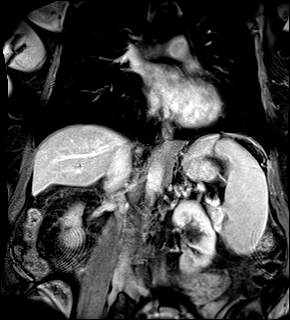
[im 72/72]
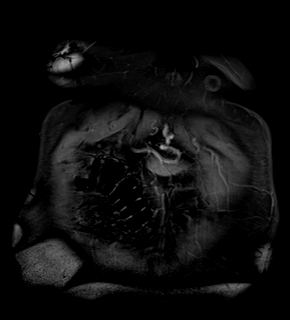

[48 of 48 positions shown; findings below may reference images not displayed]

FINDINGS: Lower chest: No acute abnormality at the lung bases.

Hepatobiliary: Atrophic liver with diffusely irregular liver surface
compatible with known cirrhosis. No hepatic steatosis. No liver
masses. No foci of arterial phase hyperenhancement in the liver.
Cholecystectomy. Bile ducts are within normal post cholecystectomy
limits. Common bile duct diameter 6 mm. No choledocholithiasis. No
biliary masses, strictures or beading.

Pancreas: Unilocular 0.8 cm pancreatic tail cystic lesion (series
6/image 15) without enhancement, wall thickening or thick internal
septations. No additional pancreatic lesions. No pancreatic duct
dilation. No pancreas divisum.

Spleen: Moderate splenomegaly. Craniocaudal splenic length 16.2 cm.
No splenic mass.

Adrenals/Urinary Tract: Normal adrenals. No hydronephrosis. Simple
bilateral renal cysts, largest a 3.5 cm parapelvic interpolar left
renal cyst. No suspicious renal masses.

Stomach/Bowel: Normal non-distended stomach. Visualized small and
large bowel is normal caliber, with no bowel wall thickening.

Vascular/Lymphatic: Atherosclerotic nonaneurysmal abdominal aorta.
Patent portal, splenic, hepatic and renal veins. Moderate
splenorenal shunt with perisplenic varices. Mild gastroesophageal
and paraumbilical varices. No pathologically enlarged lymph nodes in
the abdomen.

Other: No abdominal ascites or focal fluid collection.

Musculoskeletal: No aggressive appearing focal osseous lesions.
IMPRESSION: 1. Cirrhosis. No liver masses.
2. Moderate splenomegaly. No ascites. Moderate splenorenal shunt
with mild gastroesophageal and paraumbilical varices.
3. Unilocular 0.8 cm pancreatic tail cystic lesion without high risk
features, most likely a side branch IPMN. Follow-up MRI abdomen
without and with IV contrast recommended in 2 years. This
recommendation follows ACR consensus guidelines: Management of
Incidental Pancreatic Cysts: A White Paper of the ACR Incidental
Findings Committee. [HOSPITAL] [6F];[DATE].

## 2019-08-31 MED ORDER — GADOBUTROL 1 MMOL/ML IV SOLN
8.0000 mL | Freq: Once | INTRAVENOUS | Status: AC | PRN
  Administered 2019-08-31: 8 mL via INTRAVENOUS

## 2019-08-31 NOTE — Telephone Encounter (Signed)
Patient notified and verbalized understanding. 

## 2019-08-31 NOTE — Telephone Encounter (Signed)
I asked the pt to send a post MRI transmission because they did not interrogate her device at the hospital. She states they did interrogate her device. It was the last thing they did before she left. I looked in [facility] and do see the [facility] express for her. I told her I would let Renee know.

## 2019-08-31 NOTE — Progress Notes (Signed)
[facility] express sent per policy. Orders received from [facility]. Mode ODO and surescan on. Will reprogram once scan is completed and send transmission

## 2019-09-03 ENCOUNTER — Other Ambulatory Visit: Payer: Self-pay

## 2019-09-03 ENCOUNTER — Ambulatory Visit (INDEPENDENT_AMBULATORY_CARE_PROVIDER_SITE_OTHER): Payer: Medicare Other | Admitting: Podiatry

## 2019-09-03 DIAGNOSIS — L03032 Cellulitis of left toe: Secondary | ICD-10-CM | POA: Diagnosis not present

## 2019-09-03 MED ORDER — MUPIROCIN 2 % EX OINT: 1.0000 "application " | TOPICAL_OINTMENT | Freq: Two times a day (BID) | CUTANEOUS | 2 refills | Status: DC

## 2019-09-03 MED ORDER — CEPHALEXIN 500 MG PO CAPS: 500.0000 mg | ORAL_CAPSULE | Freq: Three times a day (TID) | ORAL | 0 refills | Status: DC

## 2019-09-03 NOTE — Progress Notes (Signed)
  Subjective:  Patient ID: AMRIT ERCK, female    DOB: November 16, 1945,  MRN: [id number]  Nail infection left hallux  74 y.o. female presents with the above complaint. History confirmed with patient.  She is been taking Keflex from her PCP.  Has not change the dressing on it.  She is unable to soak.  She lives by her self. She takes sponge baths only.  Objective:  Physical Exam: warm, good capillary refill and normal DP and PT pulses, mycotic nail with erythema, no deep ingrown nail border  Assessment:   1. Infection of nail bed of toe of left foot      Plan:  Patient was evaluated and treated and all questions answered.   - Refilled rx for keflex, continue additional 10 days - Advised to soak with warm compresses if she is unable to soak in deep epsom soaks - Apply mupirocin daily with clean bandaid -She did not have a deep wound today.  I am concerned that a surgical nail avulsion with or without matricectomy may be difficult for her to care for on her own given her social needs.  Return in about 3 weeks (around 09/24/2019) for nail re-check.

## 2019-09-03 NOTE — Patient Instructions (Addendum)
Soak the toe with a warm water compress daily, then dry and apply the mupirocin ointment and a clean dry bandage or band-aid  Take the keflex three times daily for 10 days

## 2019-09-04 DIAGNOSIS — M797 Fibromyalgia: Secondary | ICD-10-CM | POA: Diagnosis not present

## 2019-09-04 DIAGNOSIS — Z483 Aftercare following surgery for neoplasm: Secondary | ICD-10-CM | POA: Diagnosis not present

## 2019-09-04 DIAGNOSIS — F419 Anxiety disorder, unspecified: Secondary | ICD-10-CM | POA: Diagnosis not present

## 2019-09-04 DIAGNOSIS — M79601 Pain in right arm: Secondary | ICD-10-CM | POA: Diagnosis not present

## 2019-09-04 DIAGNOSIS — M150 Primary generalized (osteo)arthritis: Secondary | ICD-10-CM | POA: Diagnosis not present

## 2019-09-04 DIAGNOSIS — G8929 Other chronic pain: Secondary | ICD-10-CM | POA: Diagnosis not present

## 2019-09-05 ENCOUNTER — Ambulatory Visit: Payer: Medicare Other | Admitting: Family Medicine

## 2019-09-07 DIAGNOSIS — F419 Anxiety disorder, unspecified: Secondary | ICD-10-CM | POA: Diagnosis not present

## 2019-09-07 DIAGNOSIS — G8929 Other chronic pain: Secondary | ICD-10-CM | POA: Diagnosis not present

## 2019-09-07 DIAGNOSIS — M79601 Pain in right arm: Secondary | ICD-10-CM | POA: Diagnosis not present

## 2019-09-07 DIAGNOSIS — M797 Fibromyalgia: Secondary | ICD-10-CM | POA: Diagnosis not present

## 2019-09-07 DIAGNOSIS — Z483 Aftercare following surgery for neoplasm: Secondary | ICD-10-CM | POA: Diagnosis not present

## 2019-09-07 DIAGNOSIS — M150 Primary generalized (osteo)arthritis: Secondary | ICD-10-CM | POA: Diagnosis not present

## 2019-09-11 DIAGNOSIS — Z483 Aftercare following surgery for neoplasm: Secondary | ICD-10-CM | POA: Diagnosis not present

## 2019-09-11 DIAGNOSIS — M79601 Pain in right arm: Secondary | ICD-10-CM | POA: Diagnosis not present

## 2019-09-11 DIAGNOSIS — M150 Primary generalized (osteo)arthritis: Secondary | ICD-10-CM | POA: Diagnosis not present

## 2019-09-11 DIAGNOSIS — M797 Fibromyalgia: Secondary | ICD-10-CM | POA: Diagnosis not present

## 2019-09-11 DIAGNOSIS — F419 Anxiety disorder, unspecified: Secondary | ICD-10-CM | POA: Diagnosis not present

## 2019-09-11 DIAGNOSIS — G8929 Other chronic pain: Secondary | ICD-10-CM | POA: Diagnosis not present

## 2019-09-13 ENCOUNTER — Telehealth: Payer: Self-pay | Admitting: Podiatry

## 2019-09-13 DIAGNOSIS — L03032 Cellulitis of left toe: Secondary | ICD-10-CM

## 2019-09-13 MED ORDER — CEPHALEXIN 500 MG PO CAPS: 500.0000 mg | ORAL_CAPSULE | Freq: Three times a day (TID) | ORAL | 0 refills | Status: AC

## 2019-09-13 NOTE — Telephone Encounter (Signed)
Pt called and stated that she was almost out of antibiotics on Saturday and she stated that its still seems infected its red she states that she cant come in due to transportation they have to have a several day notice she would like another antibiotic states she does have pain

## 2019-09-13 NOTE — Telephone Encounter (Signed)
Sent a refill of keflex. I will see her in 2 weeks. Thanks!

## 2019-09-13 NOTE — Addendum Note (Signed)
Addended bySherryle Lis, Lakeyn Dokken R on: 09/13/2019 09:07 AM   Modules accepted: Orders

## 2019-09-18 ENCOUNTER — Other Ambulatory Visit (INDEPENDENT_AMBULATORY_CARE_PROVIDER_SITE_OTHER): Payer: Medicare Other

## 2019-09-18 DIAGNOSIS — K7469 Other cirrhosis of liver: Secondary | ICD-10-CM

## 2019-09-18 DIAGNOSIS — R748 Abnormal levels of other serum enzymes: Secondary | ICD-10-CM

## 2019-09-18 DIAGNOSIS — K754 Autoimmune hepatitis: Secondary | ICD-10-CM

## 2019-09-18 LAB — COMPREHENSIVE METABOLIC PANEL WITH GFR
ALT: 19 U/L (ref 0–35)
AST: 34 U/L (ref 0–37)
Albumin: 2.9 g/dL — ABNORMAL LOW (ref 3.5–5.2)
Alkaline Phosphatase: 66 U/L (ref 39–117)
BUN: 12 mg/dL (ref 6–23)
CO2: 23 meq/L (ref 19–32)
Calcium: 8.3 mg/dL — ABNORMAL LOW (ref 8.4–10.5)
Chloride: 105 meq/L (ref 96–112)
Creatinine, Ser: 0.57 mg/dL (ref 0.40–1.20)
GFR: 103.33 mL/min (ref >60.00)
Glucose, Bld: 83 mg/dL (ref 70–99)
Potassium: 3.7 meq/L (ref 3.5–5.1)
Sodium: 132 meq/L — ABNORMAL LOW (ref 135–145)
Total Bilirubin: 0.8 mg/dL (ref 0.2–1.2)
Total Protein: 6.7 g/dL (ref 6.0–8.3)

## 2019-09-18 LAB — CBC WITH DIFFERENTIAL/PLATELET
Basophils Absolute: 0 K/uL (ref 0.0–0.1)
Basophils Relative: 0.5 % (ref 0.0–3.0)
Eosinophils Absolute: 0 K/uL (ref 0.0–0.7)
Eosinophils Relative: 1.5 % (ref 0.0–5.0)
HCT: 35.9 % — ABNORMAL LOW (ref 36.0–46.0)
Hemoglobin: 12 g/dL (ref 12.0–15.0)
Lymphocytes Relative: 34.9 % (ref 12.0–46.0)
Lymphs Abs: 0.4 K/uL — ABNORMAL LOW (ref 0.7–4.0)
MCHC: 33.4 g/dL (ref 30.0–36.0)
MCV: 94.4 fl (ref 78.0–100.0)
Monocytes Absolute: 0.2 K/uL (ref 0.1–1.0)
Monocytes Relative: 13.2 % — ABNORMAL HIGH (ref 3.0–12.0)
Neutro Abs: 0.6 K/uL — ABNORMAL LOW (ref 1.4–7.7)
Neutrophils Relative %: 49.9 % (ref 43.0–77.0)
Platelets: 57 K/uL — ABNORMAL LOW (ref 150.0–400.0)
RBC: 3.8 Mil/uL — ABNORMAL LOW (ref 3.87–5.11)
RDW: 16.5 % — ABNORMAL HIGH (ref 11.5–15.5)
WBC: 1.2 K/uL — CL (ref 4.0–10.5)

## 2019-09-19 LAB — IGG: IgG (Immunoglobin G), Serum: 2633 mg/dL — ABNORMAL HIGH (ref 600–1540)

## 2019-09-20 ENCOUNTER — Ambulatory Visit: Payer: Medicare Other | Admitting: Podiatry

## 2019-09-20 DIAGNOSIS — Z483 Aftercare following surgery for neoplasm: Secondary | ICD-10-CM | POA: Diagnosis not present

## 2019-09-20 DIAGNOSIS — M79601 Pain in right arm: Secondary | ICD-10-CM | POA: Diagnosis not present

## 2019-09-20 DIAGNOSIS — F419 Anxiety disorder, unspecified: Secondary | ICD-10-CM | POA: Diagnosis not present

## 2019-09-20 DIAGNOSIS — M797 Fibromyalgia: Secondary | ICD-10-CM | POA: Diagnosis not present

## 2019-09-20 DIAGNOSIS — G8929 Other chronic pain: Secondary | ICD-10-CM | POA: Diagnosis not present

## 2019-09-20 DIAGNOSIS — M150 Primary generalized (osteo)arthritis: Secondary | ICD-10-CM | POA: Diagnosis not present

## 2019-09-27 ENCOUNTER — Other Ambulatory Visit: Payer: Self-pay

## 2019-09-27 ENCOUNTER — Ambulatory Visit (INDEPENDENT_AMBULATORY_CARE_PROVIDER_SITE_OTHER): Payer: Medicare Other | Admitting: Podiatry

## 2019-09-27 DIAGNOSIS — M79674 Pain in right toe(s): Secondary | ICD-10-CM | POA: Diagnosis not present

## 2019-09-27 DIAGNOSIS — B351 Tinea unguium: Secondary | ICD-10-CM

## 2019-09-27 DIAGNOSIS — L03032 Cellulitis of left toe: Secondary | ICD-10-CM | POA: Diagnosis not present

## 2019-09-27 DIAGNOSIS — M79675 Pain in left toe(s): Secondary | ICD-10-CM | POA: Diagnosis not present

## 2019-09-27 MED ORDER — SULFAMETHOXAZOLE-TRIMETHOPRIM 800-160 MG PO TABS: 1.0000 | ORAL_TABLET | Freq: Two times a day (BID) | ORAL | 0 refills | Status: DC

## 2019-09-28 NOTE — Progress Notes (Signed)
  Subjective:  Patient ID: Stacey Velazquez, female    DOB: [birth year],  MRN: [id number]  Nail infection left hallux  74 y.o. female returns with the above complaint. History confirmed with patient.  She is still taking Keflex.  Objective:  Physical Exam: warm, good capillary refill and normal DP and PT pulses, mycotic nail with erythema to the level of the IPJ, no deep ingrown nail border  Assessment:   1. Infection of nail bed of toe of left foot   2. Pain due to onychomycosis of toenails of both feet   3. Paronychia of great toe, left      Plan:  Patient was evaluated and treated and all questions answered.   -She did not improve with Keflex.  We will switch to Bactrim for 14 days -Discussed possibility of total nail avulsion with her.  She has a lidocaine allergy that she does not recall the exact reaction but says it is severe.  I discussed with her that we do not have any local anesthetics to perform a nail avulsion from the ester family, we only have amides here.  I offered multiple options for this: Continue antibiotics with new prescription for Bactrim for the next 2 weeks, refer her to a podiatrist that does have an ester local anesthetic, or perform a surgical nail avulsion in the operating room under anesthesia.  She elected to try the additional 2 weeks of antibiotics to see if this will resolve.  Return in about 2 weeks (around 10/11/2019).

## 2019-10-02 ENCOUNTER — Other Ambulatory Visit: Payer: Self-pay

## 2019-10-02 ENCOUNTER — Telehealth: Payer: Self-pay | Admitting: Podiatry

## 2019-10-02 MED ORDER — SULFAMETHOXAZOLE-TRIMETHOPRIM 800-160 MG PO TABS: 1.0000 | ORAL_TABLET | Freq: Two times a day (BID) | ORAL | 0 refills | Status: AC

## 2019-10-02 NOTE — Addendum Note (Signed)
Addended bySherryle Lis, Jevin Camino R on: 10/02/2019 08:37 AM   Modules accepted: Orders

## 2019-10-02 NOTE — Telephone Encounter (Signed)
Pt called stating that her pharmacy has not received the medication you were prescribing her from her last appointment. Pharmacy is [facility]

## 2019-10-02 NOTE — Telephone Encounter (Signed)
Rx re-sent to [facility], [facility] 10/02/2019

## 2019-10-03 ENCOUNTER — Ambulatory Visit (INDEPENDENT_AMBULATORY_CARE_PROVIDER_SITE_OTHER): Payer: Medicare Other | Admitting: Family Medicine

## 2019-10-03 ENCOUNTER — Telehealth: Payer: Self-pay | Admitting: Podiatry

## 2019-10-03 ENCOUNTER — Encounter: Payer: Self-pay | Admitting: Family Medicine

## 2019-10-03 VITALS — BP 112/68 | HR 73 | Temp 97.9°F | Ht 69.0 in

## 2019-10-03 DIAGNOSIS — E039 Hypothyroidism, unspecified: Secondary | ICD-10-CM | POA: Diagnosis not present

## 2019-10-03 DIAGNOSIS — L03032 Cellulitis of left toe: Secondary | ICD-10-CM

## 2019-10-03 LAB — TSH: TSH: 1.4 u[IU]/mL (ref 0.35–4.50)

## 2019-10-03 NOTE — Telephone Encounter (Signed)
I sent twice via e-Rx but there appears to be some error with it, I confirmed with the pharmacist that it did not go through. Called in a verbal Rx to pharmacist directly and they are filling it.  Lanae Crumbly, DPM 10/03/2019

## 2019-10-03 NOTE — Telephone Encounter (Signed)
Pt says her medication still has not been called in. Needs to be sent to [facility], phone number is [phone].

## 2019-10-03 NOTE — Progress Notes (Addendum)
Established Patient Office Visit  Subjective:  Patient ID: Stacey Velazquez, female    DOB: July 13, 1945  Age: 74 y.o. MRN: [id number]  CC:  Chief Complaint  Patient presents with  . Follow-up    3 month follow up on hypothyroidism.     HPI Stacey Velazquez presents for follow-up of hypothyroidism.  Again she assures me that she is taking thyroid medicine daily an hour before eating on a fasting stomach.  She tells of a lot of aches and pains from her neck down.  Ongoing GI work-up for cirrhosis and pancreatic cyst.  Continues to live alone.  She has relatives and friends who stop by.  Past Medical History:  Diagnosis Date  . Adenomatous polyp of ascending colon   . Anxiety   . Bilateral lower extremity edema    burning sensations  . Bipolar 1 disorder ([place])   . Carpal tunnel syndrome   . Cirrhosis of liver ([place])   . Cutaneous horn   . Drug-induced Parkinsonism ([place])    patient unaware  . Esophageal varices ([place])   . Fibromyalgia    patient denies  . Gastric ulcer   . GERD (gastroesophageal reflux disease)   . Gingival abscess 08/26/2016  . Glaucoma 2003   pt unaware  . Heart attack ([place])   . Heart disease   . Hepatitis, autoimmune ([place]) 08/25/2014  . History of iron deficiency anemia   . Hypotension   . Hypothyroidism   . Migraines   . Neutropenia ([place])   . Osteoporosis   . Pacemaker Medtronic    MRI compatible  . Paranoid schizophrenia ([place])   . Polyarthritis   . Polyosteoarthritis   . Rectal mass   . Seizures ([place])    last sz 08/21/14  . Sinus arrest   . Skin cancer    forehead  . SSS (sick sinus syndrome) ([place])   . Stroke ([place])   . Thrombocytopenia ([place])   . Vertigo    bvvp    Past Surgical History:  Procedure Laterality Date  . BIOPSY  03/06/2019   Procedure: BIOPSY;  Surgeon: Thornton Park, MD;  Location: [facility];  Service: Gastroenterology;;  EGD and COLON  . CATARACT EXTRACTION Left 2008   pt unaware  . CHOLECYSTECTOMY    . COLONOSCOPY WITH  PROPOFOL N/A 03/06/2019   Procedure: COLONOSCOPY WITH PROPOFOL;  Surgeon: Thornton Park, MD;  Location: [facility];  Service: Gastroenterology;  Laterality: N/A;  . ESOPHAGOGASTRODUODENOSCOPY (EGD) WITH PROPOFOL N/A 03/06/2019   Procedure: ESOPHAGOGASTRODUODENOSCOPY (EGD) WITH PROPOFOL ;  Surgeon: Thornton Park, MD;  Location: [facility];  Service: Gastroenterology;  Laterality: N/A;  . KNEE SURGERY Left 1985  . PACEMAKER INSERTION  2014  . PARTIAL HYSTERECTOMY  1979  . PARTIAL PROCTECTOMY BY TEM N/A 05/10/2019   Procedure: TEM PARTIAL PROCTECTOMY OF RECTAL MASS WITH EXCISION RIGHT PERINEAL SKIN MASS;  Surgeon: Michael Boston, MD;  Location: [facility];  Service: General;  Laterality: N/A;  . POLYPECTOMY  03/06/2019   Procedure: POLYPECTOMY;  Surgeon: Thornton Park, MD;  Location: [facility];  Service: Gastroenterology;;  . REFRACTIVE SURGERY    . TUBAL LIGATION      Family History  Problem Relation Age of Onset  . Heart attack Father   . Heart disease Paternal Uncle   . Heart disease Paternal Grandmother   . Diabetes Paternal Grandmother   . Heart disease Paternal Grandfather   . Breast cancer Mother   . Cancer Maternal Grandmother   . CVA  Maternal Grandmother   . Cancer Maternal Grandfather   . CVA Maternal Grandfather   . Diabetes Sister   . Diabetes Brother   . Diabetes Brother   . Stroke Daughter     Social History   Socioeconomic History  . Marital status: Widowed    Spouse name: Not on file  . Number of children: Not on file  . Years of education: Not on file  . Highest education level: Not on file  Occupational History  . Not on file  Tobacco Use  . Smoking status: Former Smoker    Quit date: 03/02/2003    Years since quitting: 16.6  . Smokeless tobacco: Never Used  Vaping Use  . Vaping Use: Never used  Substance and Sexual Activity  . Alcohol use: No  . Drug use: No  . Sexual activity: Not on file  Other Topics Concern  . Not on file  Social  History Narrative   Lives alone, "have friends and relatives that come and help me"   caffeine use- coffee -1 cup daily   Social Determinants of Health   Financial Resource Strain: Low Risk   . Difficulty of Paying Living Expenses: Not very hard  Food Insecurity: No Food Insecurity  . Worried About Charity fundraiser in the Last Year: Never true  . Ran Out of Food in the Last Year: Never true  Transportation Needs: Unmet Transportation Needs  . Lack of Transportation (Medical): Yes  . Lack of Transportation (Non-Medical): Yes  Physical Activity: Inactive  . Days of Exercise per Week: 0 days  . Minutes of Exercise per Session: 0 min  Stress: Stress Concern Present  . Feeling of Stress : To some extent  Social Connections: Moderately Isolated  . Frequency of Communication with Friends and Family: More than three times a week  . Frequency of Social Gatherings with Friends and Family: More than three times a week  . Attends Religious Services: 1 to 4 times per year  . Active Member of Clubs or Organizations: No  . Attends Archivist Meetings: Never  . Marital Status: Widowed  Intimate Partner Violence: Not At Risk  . Fear of Current or Ex-Partner: No  . Emotionally Abused: No  . Physically Abused: No  . Sexually Abused: No    Outpatient Medications Prior to Visit  Medication Sig Dispense Refill  . azaTHIOprine (IMURAN) 50 MG tablet TAKE TWO TABLETS (100 MG TOTAL) BY MOUTH DAILY. 60 tablet 3  . mupirocin ointment (BACTROBAN) 2 % Apply 1 application topically 2 (two) times daily. 30 g 2  . omeprazole (PRILOSEC) 20 MG capsule Take 40 mg by mouth daily before breakfast.     . tamsulosin (FLOMAX) 0.4 MG CAPS capsule Take 0.4 mg by mouth daily.    Marland Kitchen levothyroxine (SYNTHROID) 75 MCG tablet Take 1 tablet (75 mcg total) by mouth daily before breakfast. 90 tablet 0  . sulfamethoxazole-trimethoprim (BACTRIM DS) 800-160 MG tablet Take 1 tablet by mouth 2 (two) times daily for 14  days. 28 tablet 0   No facility-administered medications prior to visit.    Allergies  Allergen Reactions  . Neomycin Nausea And Vomiting  . Ambien [Zolpidem Tartrate] Other (See Comments)    Hallucinations   . Amitriptyline Other (See Comments)    Stops heart per pt  . Ampicillin Other (See Comments)    Stops heart per pt   . Anaprox [Naproxen Sodium] Other (See Comments)    Doesn't work per pt  .  Benadryl [Diphenhydramine Hcl (Sleep)] Other (See Comments)    Rash  . Cetirizine & Related Other (See Comments)    Swelling   . Cortizone-10 [Hydrocortisone] Other (See Comments)    seizures   . Darvon [Propoxyphene] Other (See Comments)    Increased heart rate per pt   . Diazepam Other (See Comments)    Stops heart per pt  . Diflunisal Swelling and Other (See Comments)    Stops heart per pt (Dolobid)  . Duloxetine Other (See Comments)    Reaction to Cymbalta - pt doesn't remember what the reaction was  . Flexeril [Cyclobenzaprine] Other (See Comments)    Seizures   . Lactose Intolerance (Gi) Swelling and Other (See Comments)    cramping  . Lidocaine Other (See Comments)    Seizures  . Meperidine And Related Other (See Comments)    Stops heart rate (reaction to Demerol)  . Metanx [L-Methylfolate-Algae-B12-B6] Other (See Comments)    Pt does not remember reaction  . Metoclopramide Other (See Comments)    Pt does not remember reaction  . Morphine And Related Other (See Comments)    Stops heart per pt   . Nuprin [Ibuprofen] Other (See Comments)    Caused headache  . Oxycodone Other (See Comments)    Hallucinations   . Penicillins Other (See Comments)    Stops heart per pt   . Propoxyphene Other (See Comments)    Slowed heart rate per pt (reaction to Darvocet)  . Ranitidine Other (See Comments)    Pt does not remember reaction  . Vicodin [Hydrocodone-Acetaminophen] Other (See Comments)    Seizures   . Tylenol [Acetaminophen] Rash    ROS Review of Systems    Constitutional: Negative.   HENT: Negative.   Respiratory: Negative.   Cardiovascular: Negative.   Gastrointestinal: Positive for abdominal pain. Negative for anal bleeding, blood in stool, constipation, diarrhea, nausea and vomiting.  Endocrine: Negative for polyphagia and polyuria.  Genitourinary: Negative.  Negative for dysuria and hematuria.  Musculoskeletal: Positive for arthralgias.  Neurological: Negative for tremors and speech difficulty.      Objective:    Physical Exam Vitals and nursing note reviewed.  Constitutional:      General: She is not in acute distress.    Appearance: Normal appearance. She is not ill-appearing, toxic-appearing or diaphoretic.  HENT:     Head: Normocephalic and atraumatic.     Right Ear: External ear normal.     Left Ear: External ear normal.  Eyes:     General: No scleral icterus.       Right eye: No discharge.        Left eye: No discharge.     Conjunctiva/sclera: Conjunctivae normal.  Cardiovascular:     Rate and Rhythm: Normal rate and regular rhythm.  Pulmonary:     Effort: Pulmonary effort is normal.     Breath sounds: Normal breath sounds.  Abdominal:     General: There is no distension.     Tenderness: There is no guarding or rebound.  Skin:    General: Skin is warm and dry.  Neurological:     Mental Status: She is alert and oriented to person, place, and time.  Psychiatric:        Mood and Affect: Mood normal.        Behavior: Behavior normal.     BP 112/68   Pulse 73   Temp 97.9 F (36.6 C) (Tympanic)   Ht 5\' 9"  (1.753 m)  SpO2 98%   BMI 31.16 kg/m  Wt Readings from Last 3 Encounters:  08/16/19 (!) 211 lb (95.7 kg)  07/19/19 211 lb (95.7 kg)  06/12/19 211 lb (95.7 kg)     Health Maintenance Due  Topic Date Due  . Hepatitis C Screening  Never done  . PNA vac Low Risk Adult (2 of 2 - PPSV23) 04/19/2018  . INFLUENZA VACCINE  08/26/2019    There are no preventive care reminders to display for this  patient.  Lab Results  Component Value Date   TSH 1.40 10/03/2019   Lab Results  Component Value Date   WBC 1.2 Repeated and verified X2. (LL) 09/18/2019   HGB 12.0 09/18/2019   HCT 35.9 (L) 09/18/2019   MCV 94.4 09/18/2019   PLT 57.0 (L) 09/18/2019   Lab Results  Component Value Date   NA 132 (L) 09/18/2019   K 3.7 09/18/2019   CO2 23 09/18/2019   GLUCOSE 83 09/18/2019   BUN 12 09/18/2019   CREATININE 0.57 09/18/2019   BILITOT 0.8 09/18/2019   ALKPHOS 66 09/18/2019   AST 34 09/18/2019   ALT 19 09/18/2019   PROT 6.7 09/18/2019   ALBUMIN 2.9 (L) 09/18/2019   CALCIUM 8.3 (L) 09/18/2019   ANIONGAP 6 06/05/2019   GFR 103.33 09/18/2019   No results found for: CHOL No results found for: HDL No results found for: LDLCALC No results found for: TRIG No results found for: CHOLHDL Lab Results  Component Value Date   HGBA1C 5.1 05/08/2019      Assessment & Plan:   Problem List Items Addressed This Visit      Endocrine   Hypothyroidism - Primary (Chronic)   Relevant Medications   levothyroxine (SYNTHROID) 75 MCG tablet   Other Relevant Orders   TSH (Completed)      Meds ordered this encounter  Medications  . levothyroxine (SYNTHROID) 75 MCG tablet    Sig: Take 1 tablet (75 mcg total) by mouth daily before breakfast.    Dispense:  90 tablet    Refill:  1    Follow-up: Return in about 3 months (around 01/02/2020).  Hopefully myalgias and arthralgias will improve with improved thyroid replacement.  May consider gabapentin next visit if aches and pains persist.  Libby Maw, MD

## 2019-10-04 MED ORDER — LEVOTHYROXINE SODIUM 75 MCG PO TABS: 75.0000 ug | ORAL_TABLET | Freq: Every day | ORAL | 1 refills | Status: DC

## 2019-10-04 NOTE — Addendum Note (Signed)
Addended by: Jon Billings on: 10/04/2019 07:52 AM   Modules accepted: Orders

## 2019-10-05 ENCOUNTER — Ambulatory Visit (INDEPENDENT_AMBULATORY_CARE_PROVIDER_SITE_OTHER): Payer: Medicare Other | Admitting: Gastroenterology

## 2019-10-05 ENCOUNTER — Encounter: Payer: Self-pay | Admitting: Gastroenterology

## 2019-10-05 VITALS — BP 123/78 | HR 69 | Ht 69.0 in | Wt 211.0 lb

## 2019-10-05 DIAGNOSIS — K259 Gastric ulcer, unspecified as acute or chronic, without hemorrhage or perforation: Secondary | ICD-10-CM | POA: Diagnosis not present

## 2019-10-05 DIAGNOSIS — I8500 Esophageal varices without bleeding: Secondary | ICD-10-CM | POA: Diagnosis not present

## 2019-10-05 DIAGNOSIS — K7469 Other cirrhosis of liver: Secondary | ICD-10-CM

## 2019-10-05 MED ORDER — TACROLIMUS 1 MG PO CAPS: 1.0000 mg | ORAL_CAPSULE | Freq: Two times a day (BID) | ORAL | 2 refills | Status: DC

## 2019-10-05 NOTE — Progress Notes (Signed)
Referring Provider: Libby Maw Primary Care Physician:  Libby Maw, MD  Chief complaint:  Autoimmune hepatitis   IMPRESSION:  Cirrhosis due to AIH    - 2/21 labs show a MELD of 11, CPT score 7    - hypoalbuminemia, mild coagulopathy, and thrombocytopenia.  Autoimmune hepatitis (AIH) previously on azathioprine    - Diagnosed in 2006    - Liver biopsy 2006: Grade 3, stage I autoimmune hepatitis    - Previously on azathioprine and steroids    - Last seen in the [facility] hepatology clinic 2014    - Most recently treated by Dr. Benson Norway, last in 2018    - IgG 3501 02/27/19, 3164 05/01/19, 2620 06/21/19    - resumed azathioprine 100 mg QD 02/2019 Pancreatic tail cystic lesion    - MRI 08/31/19: 0.8cm tail lesion without high risk features, likely side branch IPMN    - follow-up MRI with and without contrast recommended in 2 years 2.6 cm rectal tubulovillous adenoma presenting with bleeding and change in stool caliber x 1 year    - Colonoscopy 03/06/19 showed a 20 mm polyp in the distal rectum adjacent to the dentate line    - TEM partial proctectomy with Dr. Johney Maine 05/10/19 History of advanced colon polyps    - Multiple colonoscopies with tubular adenomas and tubulovillous adenomas    - Colonoscopy 2018 at [facility]: 62m polyp in the ascending and at the hepatic flexure    - Surveillance colonoscopy recommended in 1 years    - History of 261mdistal rectal polyp on colonoscopy 03/06/19, 3 TAs, 2 HPs Gastric ulcer on EGD 02/2019    - H pylori negative on biopsies    - follow-up recommended in 3 months Esophageal varices on EGD 02/10/16 (HBenson Norway   - grade 2 esophageal varices on EGD 02/2019 Recent hypokalemia Uses a walker at home, wheelchair when out of her apartment Transportation issues  Autoimmune hepatitis: Her progressively increasing IgG suggest that she is not responding to azathioprine despite the progressive cytopenia. At this point options to azathioprine should be considered.  We discussed the alternatives and I recommended a trial of Prograf.   Cirrhosis by labs and imaging: Continue screening for hepatocellular carcinoma recommended given her history of cirrhosis and esophageal varices. Proceed with cross-sectional screening in 6 months. MRI recommended given her body habitus.   Esophageal varices:  Consider starting non-selective beta-blocker therapy on next visit if there are no obvious contraindication at that time.  Gastric ulcer: PPI BID. She is due repeat EGD. Avoid NSAIDs.   Newly identified pancreatic cyst: Will follow with serial imaging. Plan EUS with any changes.    PLAN: - Abstain from all alcohol - Continue azathioprine 100 mg QD until she has been on tacrolimus for 1 week - Start tacrolimus 3 mg QD (#30 with 3 refills) - Labs every 2 weeks including hepatic function panel and IgG - Semi-annual labs: CMP, CBC, AFP and PT/INR - due at time of endoscopy - Continue omeprazole 40 mg BID - Avoid NSAIDs - Add carvedilol in the future for esophageal varices if she can tolerate the dose - EGD to follow-up on the gastric ulcer - MRI for hepatocellular carcinoma screening in January 2022 - Surveillance Colonoscopy 04/2022 - Follow-up in this office in 3-4 months, earlier as needed  I spent over 50 minutes of time, including in depth chart review, independent review of results as outlined above, communicating results with the patient directly, face-to-face time with  the patient, coordinating care, ordering studies and medications as appropriate, and documentation.    HPI: Stacey Velazquez is a 74 y.o. female who returns in scheduled follow-up for cirrhosis due to autoimmune hepatitis. She has previously been followed by the hepatology clinic at [facility] (last seen in 2014), the GI clinic at [facility] (last seen 2018), and by Dr. Carol Ada (last seen 12/13/16). She was on azathioprine 100 mg daily and tapering off her steroids at that time.  A liver biopsy reviewed in  2006 of grade 3 stage I however I am unable to identify a date of the biopsy. She was more recently off medications from 2018 until 3/21. There is no history of jaundice, ascites, hepatic encephalopathy, hepatorenal syndrome, or hepatopulmonary syndrome.  She resumed azathioprine 03/28/19. She is tolerating the medication without any complaint and reports 100% adherence. However, she has had pancytopenia while on treatment.  Although her IgG improved from 3501 pretreatment to a low of 2399 in June, it has been increasing since that time and has most recently been 2633.    No new GI complaints today.   Recent Labs: 02/24/15: IgG 3092 02/27/19: IgG 3501 03/24/19 showed normal CMP except for Na 133, glucose 110, calcium 7.9, albumin 2.4, AST 67, ALT 55. WBC 4.4, hgb 13.4, platelets 78, MCV 94.5, INR 1.4.   05/01/19: TB 1.0, AST 32, ALT 17, alk phos 72, alb 2.9, WBC 1.8, hgb 13.1, platelets 77, ANC 1.0, ALC 0.5, IgG 3164 06/21/19: TB 0.8, AST 32, ALT 19, alk phos 67, alb 2.9, WBC 1.5, hgb 12, platelets 90, ANC 0.8, ALT 0.5, IgG 2620 07/19/19: TB 1.0, AST 30, ALT 17, alk phos 65, alb 2.9, WBC 1.3, ANC 0.7, ALC 0.5, hgb 11.7, platelets 65 08/17/2019: Total bilirubin 0.8, AST 33, ALT 19, alk phos 65, albumin 3.0, WBC 1.3, hemoglobin 12.1, platelets 63, IgG 2497 09/18/2019: TB 0.8, AST 34, ALT 19, alk phos 66, albumin 2.9, WBC 1.2, hemoglobin 12.0, platelets 57, IgG 2633   Endoscopic History: EGD 03/06/19: Grade II varices were found in the middle third of the esophagus. Moderate portal hypertensive gastropathy. Gastritis. One non-bleeding cratered gastric ulcer with no stigmata of bleeding was found on the lesser curvature of the stomach. There was no H pylori.  Colonoscopy with Dr. Benson Norway 02/10/2016 showed 6 polyps.  4 polyps ranged from 3 to 6 mm were removed and the descending: And transverse colon.  There were 2 polyps ranging from 30 to 40 mm that were biopsied and tattooed in the ascending colon and transverse.   Pathology report showed tubulous villous adenoma of high-grade dysplasia.  The patient was referred to [facility].   Colonoscopy with Dr. Stephanie Acre at [facility] 05/26/16: "The perianal and digital rectal examinations were normal. A greater than 50 mm polyp was found in the proximal ascending colon. The polyp was sessile. Preparations were made for mucosal resection. Saline with methylene blue was injected to raise the lesion. Snare mucosal resection was performed. Resection and retrieval were complete. Coagulation for tissue destruction using snare was successful. A 50 mm polyp was found in the hepatic flexure. The polyp was semi-pedunculated. Preparations were made for mucosal resection. Saline with methylene blue was injected to raise the lesion. Snare mucosal resection was performed. Resection and retrieval were complete. Coagulation for tissue destruction using snare was successful. The exam was otherwise without abnormality on direct and retroflexion  views." Pathology revealed Tubulovillous adenoma (multiple fragments) with multiple foci of high-grade dysplasia.  Surveillance colonoscopy recommended in 1  year.  Colonoscopy 03/06/19 showed a 20 mm polyp in the distal rectum adjacent to the dentate line. Biopsies were consistent with tubulovillous adenoma with high-grade grandular dysplasia. She also had three 1 to 4 mm tubular adenomas and two hyperplastic polyps removed at that time.  TEM partial proctectomy of bleeding rectal mass with Dr. Johney Maine.  Pathology revealed a 2.6 cm tubulovillous adenoma.  Margins were uninvolved by dysplasia.  Recent abdominal imaging: Abdominal ultrasound1/25/2017: changes of cirrhosis with a subtle nodularity to the liver contours.  No focal abnormality.  Prior cholecystectomy.  Abdominal ultrasound 03/05/19: cirrhosis and splenomegaly. There was no ascites.  MRI 09/01/19: cirrhosis without liver mass. Splenomegaly. No ascites. Collateral gastroesophageal and paraumbilical varices. 0.8 cm  pancreatic tail cystic lesion   Past Medical History:  Diagnosis Date  . Adenomatous polyp of ascending colon   . Anxiety   . Bilateral lower extremity edema    burning sensations  . Bipolar 1 disorder ([place])   . Carpal tunnel syndrome   . Cirrhosis of liver ([place])   . Cutaneous horn   . Drug-induced Parkinsonism ([place])    patient unaware  . Esophageal varices ([place])   . Fibromyalgia    patient denies  . Gastric ulcer   . GERD (gastroesophageal reflux disease)   . Gingival abscess 08/26/2016  . Glaucoma 2003   pt unaware  . Heart attack ([place])   . Heart disease   . Hepatitis, autoimmune ([place]) 08/25/2014  . History of iron deficiency anemia   . Hypotension   . Hypothyroidism   . Migraines   . Neutropenia ([place])   . Osteoporosis   . Pacemaker Medtronic    MRI compatible  . Paranoid schizophrenia ([place])   . Polyarthritis   . Polyosteoarthritis   . Rectal mass   . Seizures ([place])    last sz 08/21/14  . Sinus arrest   . Skin cancer    forehead  . SSS (sick sinus syndrome) ([place])   . Stroke ([place])   . Thrombocytopenia ([place])   . Vertigo    bvvp    Past Surgical History:  Procedure Laterality Date  . BIOPSY  03/06/2019   Procedure: BIOPSY;  Surgeon: Thornton Park, MD;  Location: [facility];  Service: Gastroenterology;;  EGD and COLON  . CATARACT EXTRACTION Left 2008   pt unaware  . CHOLECYSTECTOMY    . COLONOSCOPY WITH PROPOFOL N/A 03/06/2019   Procedure: COLONOSCOPY WITH PROPOFOL;  Surgeon: Thornton Park, MD;  Location: [facility];  Service: Gastroenterology;  Laterality: N/A;  . ESOPHAGOGASTRODUODENOSCOPY (EGD) WITH PROPOFOL N/A 03/06/2019   Procedure: ESOPHAGOGASTRODUODENOSCOPY (EGD) WITH PROPOFOL ;  Surgeon: Thornton Park, MD;  Location: [facility];  Service: Gastroenterology;  Laterality: N/A;  . KNEE SURGERY Left 1985  . PACEMAKER INSERTION  2014  . PARTIAL HYSTERECTOMY  1979  . PARTIAL PROCTECTOMY BY TEM N/A 05/10/2019   Procedure: TEM PARTIAL PROCTECTOMY  OF RECTAL MASS WITH EXCISION RIGHT PERINEAL SKIN MASS;  Surgeon: Michael Boston, MD;  Location: [facility];  Service: General;  Laterality: N/A;  . POLYPECTOMY  03/06/2019   Procedure: POLYPECTOMY;  Surgeon: Thornton Park, MD;  Location: [facility];  Service: Gastroenterology;;  . REFRACTIVE SURGERY    . TUBAL LIGATION      Current Outpatient Medications  Medication Sig Dispense Refill  . azaTHIOprine (IMURAN) 50 MG tablet TAKE TWO TABLETS (100 MG TOTAL) BY MOUTH DAILY. 60 tablet 3  . levothyroxine (SYNTHROID) 75 MCG tablet Take 1 tablet (75 mcg total) by mouth daily  before breakfast. 90 tablet 1  . mupirocin ointment (BACTROBAN) 2 % Apply 1 application topically 2 (two) times daily. 30 g 2  . omeprazole (PRILOSEC) 20 MG capsule Take 40 mg by mouth daily before breakfast.     . sulfamethoxazole-trimethoprim (BACTRIM DS) 800-160 MG tablet Take 1 tablet by mouth 2 (two) times daily for 14 days. 28 tablet 0  . tamsulosin (FLOMAX) 0.4 MG CAPS capsule Take 0.4 mg by mouth daily.     No current facility-administered medications for this visit.    Allergies as of 10/05/2019 - Review Complete 10/03/2019  Allergen Reaction Noted  . Neomycin Nausea And Vomiting 05/10/2019  . Ambien [zolpidem tartrate] Other (See Comments) 02/21/2013  . Amitriptyline Other (See Comments) 02/21/2013  . Ampicillin Other (See Comments) 02/21/2013  . Anaprox [naproxen sodium] Other (See Comments) 02/21/2013  . Benadryl [diphenhydramine hcl (sleep)] Other (See Comments) 02/21/2013  . Cetirizine & related Other (See Comments) 02/21/2013  . Cortizone-10 [hydrocortisone] Other (See Comments) 02/21/2013  . Darvon [propoxyphene] Other (See Comments) 02/21/2013  . Diazepam Other (See Comments) 02/21/2013  . Diflunisal Swelling and Other (See Comments) 02/21/2013  . Duloxetine Other (See Comments) 02/21/2013  . Flexeril [cyclobenzaprine] Other (See Comments) 02/21/2013  . Lactose intolerance (gi) Swelling and Other (See  Comments) 08/23/2014  . Lidocaine Other (See Comments) 02/21/2013  . Meperidine and related Other (See Comments) 02/21/2013  . Metanx [l-methylfolate-algae-b12-b6] Other (See Comments) 02/21/2013  . Metoclopramide Other (See Comments) 02/21/2013  . Morphine and related Other (See Comments) 02/21/2013  . Nuprin [ibuprofen] Other (See Comments) 02/21/2013  . Oxycodone Other (See Comments) 02/21/2013  . Penicillins Other (See Comments) 02/21/2013  . Propoxyphene Other (See Comments) 02/21/2013  . Ranitidine Other (See Comments) 02/21/2013  . Vicodin [hydrocodone-acetaminophen] Other (See Comments) 02/21/2013  . Tylenol [acetaminophen] Rash 02/21/2013    Family History  Problem Relation Age of Onset  . Heart attack Father   . Heart disease Paternal Uncle   . Heart disease Paternal Grandmother   . Diabetes Paternal Grandmother   . Heart disease Paternal Grandfather   . Breast cancer Mother   . Cancer Maternal Grandmother   . CVA Maternal Grandmother   . Cancer Maternal Grandfather   . CVA Maternal Grandfather   . Diabetes Sister   . Diabetes Brother   . Diabetes Brother   . Stroke Daughter     Social History   Socioeconomic History  . Marital status: Widowed    Spouse name: Not on file  . Number of children: Not on file  . Years of education: Not on file  . Highest education level: Not on file  Occupational History  . Not on file  Tobacco Use  . Smoking status: Former Smoker    Quit date: 03/02/2003    Years since quitting: 16.6  . Smokeless tobacco: Never Used  Vaping Use  . Vaping Use: Never used  Substance and Sexual Activity  . Alcohol use: No  . Drug use: No  . Sexual activity: Not on file  Other Topics Concern  . Not on file  Social History Narrative   Lives alone, "have friends and relatives that come and help me"   caffeine use- coffee -1 cup daily   Social Determinants of Health   Financial Resource Strain: Low Risk   . Difficulty of Paying Living  Expenses: Not very hard  Food Insecurity: No Food Insecurity  . Worried About Charity fundraiser in the Last Year: Never true  . Ran  Out of Food in the Last Year: Never true  Transportation Needs: Unmet Transportation Needs  . Lack of Transportation (Medical): Yes  . Lack of Transportation (Non-Medical): Yes  Physical Activity: Inactive  . Days of Exercise per Week: 0 days  . Minutes of Exercise per Session: 0 min  Stress: Stress Concern Present  . Feeling of Stress : To some extent  Social Connections: Moderately Isolated  . Frequency of Communication with Friends and Family: More than three times a week  . Frequency of Social Gatherings with Friends and Family: More than three times a week  . Attends Religious Services: 1 to 4 times per year  . Active Member of Clubs or Organizations: No  . Attends Archivist Meetings: Never  . Marital Status: Widowed  Intimate Partner Violence: Not At Risk  . Fear of Current or Ex-Partner: No  . Emotionally Abused: No  . Physically Abused: No  . Sexually Abused: No    Physical Exam: General:   Alert,  well-nourished, pleasant and cooperative in NAD. Sitting in a wheelchair.  Heart:  Regular rate and rhythm; no murmurs Pulm: Clear anteriorly; no wheezing Abdomen:  Soft. Central obesity. Nontender. Nondistended. Normal bowel sounds. No rebound or guarding. No fluid wave.  LAD: No inguinal or umbilical LAD Extremities:  Without edema. Neurologic:  Alert and  oriented x4;  grossly normal neurologically; no asterixis or clonus. Skin: No jaundice. Palmar erythema. Spider angioma on the chest wall.   Psych:  Alert and cooperative. Normal mood and affect.     Seline Enzor L. Tarri Glenn, MD, MPH 10/05/2019, 3:34 PM

## 2019-10-05 NOTE — Patient Instructions (Signed)
High Fiber Diet recommended.   Continue daily stool bulking with Metamucil.    Continue Azathioprine 100mg  daily until you have been on Tacrolimus for 1 week.   You will need labs every 2 weeks after starting Tacrolimus.   Continue Omeprazole 40mg  twice daily.   We have sent the following medications to your pharmacy for you to pick up at your convenience: Tacrolimus  Avoid all NSAIDs.   If you are age 75 or older, your body mass index should be between 23-30. Your Body mass index is 31.16 kg/m. If this is out of the aforementioned range listed, please consider follow up with your Primary Care Provider.  If you are age 5 or younger, your body mass index should be between 19-25. Your Body mass index is 31.16 kg/m. If this is out of the aformentioned range listed, please consider follow up with your Primary Care Provider.    Thank you for choosing me and [facility].  Dr.Kimberly Beavers

## 2019-10-11 ENCOUNTER — Telehealth: Payer: Self-pay | Admitting: Family Medicine

## 2019-10-11 ENCOUNTER — Ambulatory Visit: Payer: Medicare Other | Admitting: Podiatry

## 2019-10-11 NOTE — Telephone Encounter (Signed)
Bactrim can cause some stomach upset so it is important to take it with food.

## 2019-10-11 NOTE — Telephone Encounter (Signed)
Patient is calling in regards to some of the symptoms she's having (stomach pain, cramps, headaches, constipation (even with Miralax), extremely tired. She states that she went through this before her surgery and it's happening again. Offered appointment, but she doesn't feel she needs to come in. She also has some questions about the Bactrim that she has recently started taking. Please call her back at [phone] and advise.

## 2019-10-11 NOTE — Telephone Encounter (Signed)
Patient aware of message below and agrees to give [place] a call if symptoms does not improve.

## 2019-10-11 NOTE — Telephone Encounter (Signed)
Please advise message below  °

## 2019-10-16 DIAGNOSIS — R3911 Hesitancy of micturition: Secondary | ICD-10-CM | POA: Diagnosis not present

## 2019-10-16 DIAGNOSIS — R350 Frequency of micturition: Secondary | ICD-10-CM | POA: Diagnosis not present

## 2019-10-17 ENCOUNTER — Encounter: Payer: Self-pay | Admitting: Gastroenterology

## 2019-10-19 ENCOUNTER — Telehealth: Payer: Self-pay | Admitting: *Deleted

## 2019-10-19 ENCOUNTER — Other Ambulatory Visit (INDEPENDENT_AMBULATORY_CARE_PROVIDER_SITE_OTHER): Payer: Medicare Other

## 2019-10-19 ENCOUNTER — Telehealth: Payer: Self-pay | Admitting: Podiatry

## 2019-10-19 DIAGNOSIS — K754 Autoimmune hepatitis: Secondary | ICD-10-CM | POA: Diagnosis not present

## 2019-10-19 DIAGNOSIS — I8500 Esophageal varices without bleeding: Secondary | ICD-10-CM

## 2019-10-19 DIAGNOSIS — R748 Abnormal levels of other serum enzymes: Secondary | ICD-10-CM

## 2019-10-19 DIAGNOSIS — K7469 Other cirrhosis of liver: Secondary | ICD-10-CM | POA: Diagnosis not present

## 2019-10-19 DIAGNOSIS — K259 Gastric ulcer, unspecified as acute or chronic, without hemorrhage or perforation: Secondary | ICD-10-CM

## 2019-10-19 LAB — CBC WITH DIFFERENTIAL/PLATELET
Basophils Absolute: 0 10*3/uL (ref 0.0–0.1)
Basophils Relative: 0.4 % (ref 0.0–3.0)
Eosinophils Absolute: 0 10*3/uL (ref 0.0–0.7)
Eosinophils Relative: 0.6 % (ref 0.0–5.0)
HCT: 36.8 % (ref 36.0–46.0)
Hemoglobin: 12.5 g/dL (ref 12.0–15.0)
Lymphocytes Relative: 36.7 % (ref 12.0–46.0)
Lymphs Abs: 0.4 10*3/uL — ABNORMAL LOW (ref 0.7–4.0)
MCHC: 33.9 g/dL (ref 30.0–36.0)
MCV: 93.4 fl (ref 78.0–100.0)
Monocytes Absolute: 0.1 10*3/uL (ref 0.1–1.0)
Monocytes Relative: 10.2 % (ref 3.0–12.0)
Neutro Abs: 0.6 10*3/uL — ABNORMAL LOW (ref 1.4–7.7)
Neutrophils Relative %: 52.1 % (ref 43.0–77.0)
Platelets: 52 10*3/uL — ABNORMAL LOW (ref 150.0–400.0)
RBC: 3.93 Mil/uL (ref 3.87–5.11)
RDW: 17.4 % — ABNORMAL HIGH (ref 11.5–15.5)
WBC: 1.1 10*3/uL — CL (ref 4.0–10.5)

## 2019-10-19 LAB — HEPATIC FUNCTION PANEL
ALT: 19 U/L (ref 0–35)
AST: 29 U/L (ref 0–37)
Albumin: 3 g/dL — ABNORMAL LOW (ref 3.5–5.2)
Alkaline Phosphatase: 73 U/L (ref 39–117)
Bilirubin, Direct: 0.2 mg/dL (ref 0.0–0.3)
Total Bilirubin: 0.8 mg/dL (ref 0.2–1.2)
Total Protein: 6.8 g/dL (ref 6.0–8.3)

## 2019-10-19 NOTE — Telephone Encounter (Addendum)
Dr. Sherryle Lis   Just wanted to let you know that this patient has a WBC of 1.1 as of today. When I spoke to her she explained she has been battling an infected toenail and it has not been improving. Dr. Tarri Glenn ([facility]) asked me to reach out to you so that you are aware of her WBC.  Hendrixx Severin, CMA [facility]

## 2019-10-19 NOTE — Telephone Encounter (Signed)
Thank you :)

## 2019-10-19 NOTE — Telephone Encounter (Signed)
The lab called with a critical WBC of 1.1 for this patient. Dr. Tarri Glenn is out of the clinic until next Friday.

## 2019-10-19 NOTE — Telephone Encounter (Signed)
I spoke with the patient she did stop the Azathioprine on 9/19. She will come in next week for a follow up CBC.

## 2019-10-19 NOTE — Addendum Note (Signed)
Addended by: Horris Latino on: 10/19/2019 03:18 PM   Modules accepted: Orders

## 2019-10-19 NOTE — Telephone Encounter (Signed)
Patient would like to know if she still needed to be seen on 10/25/2019 since the none of the medication has not worked. She has taken two types of antibiotics. Please call patient

## 2019-10-19 NOTE — Telephone Encounter (Signed)
Please advise patient to hold Azathioprine, make sure she is feeling ok with no signs of infection. If she has any worsening symptoms will need to come to ER. Please schedule follow up visit with Dr Tarri Glenn to discuss other options for treatment of autoimmune hepatitis.  Check CBC, CMP in 5-7 days, order under Dr Payton Emerald name so will be able to review the results.

## 2019-10-19 NOTE — Telephone Encounter (Signed)
Many thanks for your help.  Stacey Velazquez, I switched this patient from azathioprine to Prograf during her office visit last week. She should be off the azathioprine this week already.   Please ask her to return for follow-up CBC with diff next week. Keep follow-up plans with me as ordered at time of office visit.  Thank you.  KLB

## 2019-10-22 ENCOUNTER — Telehealth: Payer: Self-pay

## 2019-10-22 ENCOUNTER — Telehealth: Payer: Self-pay | Admitting: Podiatry

## 2019-10-22 LAB — IGG: IgG (Immunoglobin G), Serum: 2568 mg/dL — ABNORMAL HIGH (ref 600–1540)

## 2019-10-22 NOTE — Telephone Encounter (Signed)
Pt wanted to cx appointment on 09/30 but would like for Dr. Sherryle Lis to follow up with her since the antibiotics are not working. She would like to know what to do next. She stated that you need to call Dr. Thornton Park at [facility]. Please call patient ASAP

## 2019-10-22 NOTE — Telephone Encounter (Signed)
Heather from [facility] called today stating that she send Dr. Sherryle Lis a message and as of Friday the pt WBC 1.1. She was seen in the office for an infected toenail. Please advise.

## 2019-10-23 NOTE — Telephone Encounter (Signed)
Looks like Dr. Posey Pronto is taking care of this

## 2019-10-23 NOTE — Telephone Encounter (Signed)
Does she need to still come in for her appointment. She said that Dr. Tarri Glenn stated that she didn't need to come in for the appointment since the antibiotics are not working.

## 2019-10-23 NOTE — Telephone Encounter (Signed)
I think it is definitely beneficial for Dr. Sherryle Lis to see this patient. I'm not exactly sure about the clinical scenario of this patient.

## 2019-10-23 NOTE — Telephone Encounter (Signed)
Artery called the [facility] and spoke to them.  They just simply wanted to inform that the patient's white blood count is low.  Dr. Alene Mires is aware as well.

## 2019-10-25 ENCOUNTER — Ambulatory Visit: Payer: Medicare Other | Admitting: Podiatry

## 2019-10-26 ENCOUNTER — Other Ambulatory Visit: Payer: Self-pay

## 2019-10-26 ENCOUNTER — Other Ambulatory Visit (INDEPENDENT_AMBULATORY_CARE_PROVIDER_SITE_OTHER): Payer: Medicare Other

## 2019-10-26 DIAGNOSIS — K754 Autoimmune hepatitis: Secondary | ICD-10-CM | POA: Diagnosis not present

## 2019-10-26 LAB — CBC WITH DIFFERENTIAL/PLATELET
Basophils Absolute: 0 10*3/uL (ref 0.0–0.1)
Basophils Relative: 0.5 % (ref 0.0–3.0)
Eosinophils Absolute: 0 10*3/uL (ref 0.0–0.7)
Eosinophils Relative: 0.9 % (ref 0.0–5.0)
HCT: 36.1 % (ref 36.0–46.0)
Hemoglobin: 12.2 g/dL (ref 12.0–15.0)
Lymphocytes Relative: 33.8 % (ref 12.0–46.0)
Lymphs Abs: 0.4 10*3/uL — ABNORMAL LOW (ref 0.7–4.0)
MCHC: 33.7 g/dL (ref 30.0–36.0)
MCV: 93.8 fl (ref 78.0–100.0)
Monocytes Absolute: 0.2 10*3/uL (ref 0.1–1.0)
Monocytes Relative: 11.9 % (ref 3.0–12.0)
Neutro Abs: 0.7 10*3/uL — ABNORMAL LOW (ref 1.4–7.7)
Neutrophils Relative %: 52.9 % (ref 43.0–77.0)
Platelets: 54 10*3/uL — ABNORMAL LOW (ref 150.0–400.0)
RBC: 3.85 Mil/uL — ABNORMAL LOW (ref 3.87–5.11)
RDW: 17.6 % — ABNORMAL HIGH (ref 11.5–15.5)
WBC: 1.3 10*3/uL — CL (ref 4.0–10.5)

## 2019-10-29 ENCOUNTER — Telehealth: Payer: Self-pay | Admitting: Family Medicine

## 2019-10-29 ENCOUNTER — Telehealth: Payer: Self-pay

## 2019-10-29 NOTE — Telephone Encounter (Signed)
Patient is calling and requesting a call back regarding getting another referral for her toe, please advise. CB is [phone]

## 2019-10-29 NOTE — Telephone Encounter (Signed)
-----   Message from Thornton Park, MD sent at 10/29/2019 11:27 AM EDT ----- Please ask her to follow-up with Dr. Ethelene Hal. Thank you! ----- Message ----- From: Algernon Huxley, RN Sent: 10/26/2019   3:02 PM EDT To: Horris Latino, CMA, Thornton Park, MD   She had and appt yesterday but she cancelled it because she states she had not been on antibiotics because their office is not taking care of her properly and she is not comfortable with them so she cancelled the appt. Instructed her to reschedule and she states she is not sure she wants to go back to them. ----- Message ----- From: Horris Latino, CMA Sent: 10/26/2019   2:29 PM EDT To: Algernon Huxley, RN  I see that you already called this patient. Can you please let her know to follow up with the foot doctor.   Thank you ----- Message ----- From: Thornton Park, MD Sent: 10/26/2019  11:35 AM EDT To: Horris Latino, CMA  Thanks for the information. Please ask her to follow-up with the foot doctor. It is even more important given these results! Thank you!  KLB ----- Message ----- From: Horris Latino, CMA Sent: 10/26/2019  11:25 AM EDT To: Thornton Park, MD  Lab called with a critical on patient. White count is 1.3. Patient did not keep her foot doctor follow up yesterday for her infected toenail either.

## 2019-10-29 NOTE — Telephone Encounter (Signed)
Pt states she called her PCP this morning and is waiting to hear back from their office.

## 2019-10-30 ENCOUNTER — Ambulatory Visit (INDEPENDENT_AMBULATORY_CARE_PROVIDER_SITE_OTHER): Payer: Medicare Other

## 2019-10-30 DIAGNOSIS — R55 Syncope and collapse: Secondary | ICD-10-CM | POA: Diagnosis not present

## 2019-10-30 LAB — CUP PACEART REMOTE DEVICE CHECK
Battery Remaining Longevity: 43 mo
Battery Voltage: 2.97 V
Brady Statistic AP VP Percent: 0.03 %
Brady Statistic AP VS Percent: 3.1 %
Brady Statistic AS VP Percent: 0.21 %
Brady Statistic AS VS Percent: 96.66 %
Brady Statistic RA Percent Paced: 3.13 %
Brady Statistic RV Percent Paced: 0.24 %
Date Time Interrogation Session: 20211005070913
Implantable Lead Implant Date: 20141205
Implantable Lead Implant Date: 20141205
Implantable Lead Location: 753859
Implantable Lead Location: 753860
Implantable Lead Model: 5076
Implantable Lead Model: 5076
Implantable Pulse Generator Implant Date: 20141205
Lead Channel Impedance Value: 323 Ohm
Lead Channel Impedance Value: 380 Ohm
Lead Channel Impedance Value: 399 Ohm
Lead Channel Impedance Value: 418 Ohm
Lead Channel Pacing Threshold Amplitude: 0.375 V
Lead Channel Pacing Threshold Amplitude: 0.75 V
Lead Channel Pacing Threshold Pulse Width: 0.4 ms
Lead Channel Pacing Threshold Pulse Width: 0.4 ms
Lead Channel Sensing Intrinsic Amplitude: 1.75 mV
Lead Channel Sensing Intrinsic Amplitude: 1.75 mV
Lead Channel Sensing Intrinsic Amplitude: 4.625 mV
Lead Channel Sensing Intrinsic Amplitude: 4.625 mV
Lead Channel Setting Pacing Amplitude: 2 V
Lead Channel Setting Pacing Amplitude: 2.5 V
Lead Channel Setting Pacing Pulse Width: 0.4 ms
Lead Channel Setting Sensing Sensitivity: 0.6 mV

## 2019-10-30 NOTE — Addendum Note (Signed)
Addended by: Lynda Rainwater on: 10/30/2019 10:30 AM   Modules accepted: Orders

## 2019-10-30 NOTE — Telephone Encounter (Signed)
Patient called back again and still needs Dr. Ethelene Hal to call her about this issue.

## 2019-10-30 NOTE — Telephone Encounter (Signed)
Referral sent in patient aware

## 2019-11-02 ENCOUNTER — Telehealth: Payer: Self-pay

## 2019-11-02 ENCOUNTER — Other Ambulatory Visit: Payer: Self-pay

## 2019-11-02 ENCOUNTER — Other Ambulatory Visit (INDEPENDENT_AMBULATORY_CARE_PROVIDER_SITE_OTHER): Payer: Medicare Other

## 2019-11-02 DIAGNOSIS — K7469 Other cirrhosis of liver: Secondary | ICD-10-CM

## 2019-11-02 DIAGNOSIS — D709 Neutropenia, unspecified: Secondary | ICD-10-CM

## 2019-11-02 DIAGNOSIS — K754 Autoimmune hepatitis: Secondary | ICD-10-CM | POA: Diagnosis not present

## 2019-11-02 DIAGNOSIS — R748 Abnormal levels of other serum enzymes: Secondary | ICD-10-CM | POA: Diagnosis not present

## 2019-11-02 LAB — COMPREHENSIVE METABOLIC PANEL
ALT: 20 U/L (ref 0–35)
AST: 29 U/L (ref 0–37)
Albumin: 2.9 g/dL — ABNORMAL LOW (ref 3.5–5.2)
Alkaline Phosphatase: 67 U/L (ref 39–117)
BUN: 10 mg/dL (ref 6–23)
CO2: 24 mEq/L (ref 19–32)
Calcium: 8.3 mg/dL — ABNORMAL LOW (ref 8.4–10.5)
Chloride: 105 mEq/L (ref 96–112)
Creatinine, Ser: 0.67 mg/dL (ref 0.40–1.20)
GFR: 85.77 mL/min (ref >60.00)
Glucose, Bld: 86 mg/dL (ref 70–99)
Potassium: 4.3 mEq/L (ref 3.5–5.1)
Sodium: 133 mEq/L — ABNORMAL LOW (ref 135–145)
Total Bilirubin: 0.9 mg/dL (ref 0.2–1.2)
Total Protein: 6.5 g/dL (ref 6.0–8.3)

## 2019-11-02 LAB — CBC WITH DIFFERENTIAL/PLATELET
Basophils Absolute: 0 10*3/uL (ref 0.0–0.1)
Basophils Relative: 0.6 % (ref 0.0–3.0)
Eosinophils Absolute: 0 10*3/uL (ref 0.0–0.7)
Eosinophils Relative: 0.7 % (ref 0.0–5.0)
HCT: 35.6 % — ABNORMAL LOW (ref 36.0–46.0)
Hemoglobin: 12.3 g/dL (ref 12.0–15.0)
Lymphocytes Relative: 34.3 % (ref 12.0–46.0)
Lymphs Abs: 0.4 10*3/uL — ABNORMAL LOW (ref 0.7–4.0)
MCHC: 34.4 g/dL (ref 30.0–36.0)
MCV: 93 fl (ref 78.0–100.0)
Monocytes Absolute: 0.1 10*3/uL (ref 0.1–1.0)
Monocytes Relative: 11 % (ref 3.0–12.0)
Neutro Abs: 0.6 10*3/uL — ABNORMAL LOW (ref 1.4–7.7)
Neutrophils Relative %: 53.4 % (ref 43.0–77.0)
Platelets: 54 10*3/uL — ABNORMAL LOW (ref 150.0–400.0)
RBC: 3.83 Mil/uL — ABNORMAL LOW (ref 3.87–5.11)
RDW: 17.4 % — ABNORMAL HIGH (ref 11.5–15.5)
WBC: 1.2 10*3/uL — CL (ref 4.0–10.5)

## 2019-11-02 NOTE — Progress Notes (Signed)
Remote pacemaker transmission.   

## 2019-11-02 NOTE — Telephone Encounter (Signed)
It is improving. It had been 1.1. Thank you.

## 2019-11-02 NOTE — Telephone Encounter (Signed)
Critical value WBC 1.2 Call taken from Santiago Glad in the lab.  Information sent to Dr Tarri Glenn

## 2019-11-03 LAB — IGG: IgG (Immunoglobin G), Serum: 2376 mg/dL — ABNORMAL HIGH (ref 600–1540)

## 2019-11-05 ENCOUNTER — Other Ambulatory Visit: Payer: Self-pay

## 2019-11-05 DIAGNOSIS — R1032 Left lower quadrant pain: Secondary | ICD-10-CM

## 2019-11-06 NOTE — Progress Notes (Signed)
[place] NOTE  Patient Care Team: Libby Maw, MD as PCP - General (Family Medicine) [name], MD as PCP - Electrophysiology (Cardiology) [name], MD as Consulting Physician (Cardiology) Michael Boston, MD as Consulting Physician (General Surgery) Thornton Park, MD as Consulting Physician (Gastroenterology)  CHIEF COMPLAINTS/PURPOSE OF CONSULTATION:  Neutropenia and thrombocytopenia  HISTORY OF PRESENTING ILLNESS:  Stacey Velazquez 74 y.o. female is here because of recent diagnosis of neutropenia and thrombocytopenia. Labs on 10/19/19 showed WBC 1.1, ANC 0.6, platelets 52, on 10/26/19 showed WBC 1.3, ANC 0.7, platelets 54, and on 11/02/19 showed WBC 1.2, ANC 0.6, platelets 54. She presents to the clinic today for initial evaluation.  She has an extensive past medical history of autoimmune hepatitis with cirrhosis and splenomegaly.  She has been on some form of treatment for autoimmune cirrhosis for the past 18 years.  She has been on azathioprine for the last 12 to 14 years but apparently recently her autoimmune hepatitis was getting worse and therefore they switched her to Prograf.  She has always had chronic pancytopenia and most recently her [place] was in the 0.6-0.7 range.  Her only infection over the past year has been toe infection for which she has seen podiatrist and had taken antibiotics for that.  She was sent to [place] for discussion regarding the current issues with leukopenia and thrombocytopenia.  She has not had any bruising or bleeding issues from the low platelets.  I reviewed her records extensively and collaborated the history with the patient.   MEDICAL HISTORY:  Past Medical History:  Diagnosis Date  . Adenomatous polyp of ascending colon   . Anxiety   . Bilateral lower extremity edema    burning sensations  . Bipolar 1 disorder ([place])   . Carpal tunnel syndrome   . Cirrhosis of liver ([place])   . Cutaneous horn   .  Drug-induced Parkinsonism ([place])    patient unaware  . Esophageal varices ([place])   . Fibromyalgia    patient denies  . Gastric ulcer   . GERD (gastroesophageal reflux disease)   . Gingival abscess 08/26/2016  . Glaucoma 2003   pt unaware  . Heart attack ([place])   . Heart disease   . Hepatitis, autoimmune ([place]) 08/25/2014  . History of iron deficiency anemia   . Hypotension   . Hypothyroidism   . Migraines   . Neutropenia ([place])   . Osteoporosis   . Pacemaker Med[employer]onic    MRI compatible  . Paranoid schizophrenia ([place])   . Polyarthritis   . Polyosteoarthritis   . Rectal mass   . Seizures ([place])    last sz 08/21/14  . Sinus arrest   . Skin cancer    forehead  . SSS (sick sinus syndrome) ([place])   . Stroke ([place])   . Thrombocytopenia ([place])   . Vertigo    bvvp    SURGICAL HISTORY: Past Surgical History:  Procedure Laterality Date  . BIOPSY  03/06/2019   Procedure: BIOPSY;  Surgeon: Thornton Park, MD;  Location: [facility];  Service: Gastroenterology;;  EGD and COLON  . CATARACT EXTRACTION Left 2008   pt unaware  . CHOLECYSTECTOMY    . COLONOSCOPY WITH PROPOFOL N/A 03/06/2019   Procedure: COLONOSCOPY WITH PROPOFOL;  Surgeon: Thornton Park, MD;  Location: [facility];  Service: Gastroenterology;  Laterality: N/A;  . ESOPHAGOGASTRODUODENOSCOPY (EGD) WITH PROPOFOL N/A 03/06/2019   Procedure: ESOPHAGOGASTRODUODENOSCOPY (EGD) WITH PROPOFOL ;  Surgeon: Thornton Park, MD;  Location:  [facility];  Service: Gastroenterology;  Laterality: N/A;  . KNEE SURGERY Left 1985  . PACEMAKER INSERTION  2014  . PARTIAL HYSTERECTOMY  1979  . PARTIAL PROCTECTOMY BY TEM N/A 05/10/2019   Procedure: TEM PARTIAL PROCTECTOMY OF RECTAL MASS WITH EXCISION RIGHT PERINEAL SKIN MASS;  Surgeon: Michael Boston, MD;  Location: [facility];  Service: General;  Laterality: N/A;  . POLYPECTOMY  03/06/2019   Procedure: POLYPECTOMY;  Surgeon: Thornton Park, MD;  Location: [facility];  Service: Gastroenterology;;    . REFRACTIVE SURGERY    . TUBAL LIGATION      SOCIAL HISTORY: Social History   Socioeconomic History  . Marital status: Widowed    Spouse name: Not on file  . Number of children: Not on file  . Years of education: Not on file  . Highest education level: Not on file  Occupational History  . Not on file  Tobacco Use  . Smoking status: Former Smoker    Quit date: 03/02/2003    Years since quitting: 16.6  . Smokeless tobacco: Never Used  Vaping Use  . Vaping Use: Never used  Substance and Sexual Activity  . Alcohol use: No  . Drug use: No  . Sexual activity: Not on file  Other Topics Concern  . Not on file  Social History Narrative   Lives alone, "have friends and relatives that come and help me"   caffeine use- coffee -1 cup daily   Social Determinants of Health   Financial Resource Strain: Low Risk   . Difficulty of Paying Living Expenses: Not very hard  Food Insecurity: No Food Insecurity  . Worried About Charity fundraiser in the Last Year: Never true  . Ran Out of Food in the Last Year: Never true  Transportation Needs: Unmet Transportation Needs  . Lack of Transportation (Medical): Yes  . Lack of Transportation (Non-Medical): Yes  Physical Activity: Inactive  . Days of Exercise per Week: 0 days  . Minutes of Exercise per Session: 0 min  Stress: Stress Concern Present  . Feeling of Stress : To some extent  Social Connections: Moderately Isolated  . Frequency of Communication with Friends and Family: More than three times a week  . Frequency of Social Gatherings with Friends and Family: More than three times a week  . Attends Religious Services: 1 to 4 times per year  . Active Member of Clubs or Organizations: No  . Attends Archivist Meetings: Never  . Marital Status: Widowed  Intimate Partner Violence: Not At Risk  . Fear of Current or Ex-Partner: No  . Emotionally Abused: No  . Physically Abused: No  . Sexually Abused: No    FAMILY  HISTORY: Family History  Problem Relation Age of Onset  . Heart attack Father   . Heart disease Paternal Uncle   . Heart disease Paternal Grandmother   . Diabetes Paternal Grandmother   . Heart disease Paternal Grandfather   . Breast cancer Mother   . Cancer Maternal Grandmother   . CVA Maternal Grandmother   . Cancer Maternal Grandfather   . CVA Maternal Grandfather   . Diabetes Sister   . Diabetes Brother   . Diabetes Brother   . Stroke Daughter     ALLERGIES:  is allergic to neomycin, ambien [zolpidem tartrate], amitriptyline, ampicillin, anaprox [naproxen sodium], benadryl [diphenhydramine hcl (sleep)], cetirizine & related, cortizone-10 [hydrocortisone], darvon [propoxyphene], diazepam, diflunisal, duloxetine, flexeril [cyclobenzaprine], lactose intolerance (gi), lidocaine, meperidine and related, metanx [l-methylfolate-algae-b12-b6], metoclopramide, morphine  and related, nuprin [ibuprofen], oxycodone, penicillins, propoxyphene, ranitidine, vicodin [hydrocodone-acetaminophen], and tylenol [acetaminophen].  MEDICATIONS:  Current Outpatient Medications  Medication Sig Dispense Refill  . azaTHIOprine (IMURAN) 50 MG tablet TAKE TWO TABLETS (100 MG TOTAL) BY MOUTH DAILY. 60 tablet 3  . levothyroxine (SYNTHROID) 75 MCG tablet Take 1 tablet (75 mcg total) by mouth daily before breakfast. 90 tablet 1  . mupirocin ointment (BACTROBAN) 2 % Apply 1 application topically 2 (two) times daily. 30 g 2  . omeprazole (PRILOSEC) 20 MG capsule Take 40 mg by mouth daily before breakfast.     . tacrolimus (PROGRAF) 1 MG capsule Take 1 capsule (1 mg total) by mouth 2 (two) times daily. 60 capsule 2  . tamsulosin (FLOMAX) 0.4 MG CAPS capsule Take 0.4 mg by mouth daily.     No current facility-administered medications for this visit.    REVIEW OF SYSTEMS:   Generalized fatigue and weakness and diffuse body aches and pains, wheelchair dependent for ambulation All other systems were reviewed with the  patient and are negative.  PHYSICAL EXAMINATION: ECOG PERFORMANCE STATUS: 3 - Symptomatic, >50% confined to bed  Vitals:   11/07/19 1123  BP: 124/61  Pulse: 68  Resp: 18  Temp: 97.7 F (36.5 C)  SpO2: 100%   Filed Weights       LABORATORY DATA:  I have reviewed the data as listed Lab Results  Component Value Date   WBC 1.1 (L) 11/07/2019   HGB 12.2 11/07/2019   HCT 35.9 (L) 11/07/2019   MCV 90.2 11/07/2019   PLT 50 (L) 11/07/2019   Lab Results  Component Value Date   NA 133 (L) 11/02/2019   K 4.3 11/02/2019   CL 105 11/02/2019   CO2 24 11/02/2019    RADIOGRAPHIC STUDIES: I have personally reviewed the radiological reports and agreed with the findings in the report.  ASSESSMENT AND PLAN:  Azathioprine-induced leukopenia ([place]) 02/21/2013: WBC 1.7 08/25/2014: WBC 1.4, platelets 81 05/16/2017: WBC 4.1, platelets 132 03/24/2019: WBC 4.4, pl 78 10/19/19 showed WBC 1.1, ANC 0.6, platelets 52, on 10/26/19 showed WBC 1.3, ANC 0.7, platelets 54, and on 11/02/19 showed WBC 1.2, ANC 0.6, platelets 54. 11/07/2019: WBC 1.1, platelets 50, ANC 0.5  History of autoimmune hepatitis on immunosuppressive treatments like azathioprine and Prograf This is classic drug-induced pancytopenia. I recommend that the dosage of immunosuppressive treatments to be reduced to maintain the absolute neutrophil count above 0.8. We will obtain a CBC and CMP today because apparently it has been very difficult to draw blood at her doctor's office. The dosage of these drugs will need to be monitored and adjustments may need to be made for the cytopenia.  Thrombocytopenia: It could be autoimmune thrombocytopenia or it could be related to the cirrhosis with splenomegaly or it could be related to the immunosuppressive drugs as well.  She does not have any bruising or bleeding symptoms and her platelets are above 50.  Therefore no additional treatment or treatments are necessary at this time.  There is no  reason to perform a bone marrow biopsy. Patient will be seen on an as-needed basis.  All questions were answered. The patient knows to call the clinic with any problems, questions or concerns.   Rulon Eisenmenger, MD, MPH 11/07/2019    I, Molly Dorshimer, am acting as scribe for Nicholas Lose, MD.  I have reviewed the above documentation for accuracy and completeness, and I agree with the above.

## 2019-11-07 ENCOUNTER — Telehealth: Payer: Self-pay | Admitting: Gastroenterology

## 2019-11-07 ENCOUNTER — Other Ambulatory Visit: Payer: Self-pay

## 2019-11-07 ENCOUNTER — Inpatient Hospital Stay: Payer: Medicare Other

## 2019-11-07 ENCOUNTER — Inpatient Hospital Stay: Payer: Medicare Other | Attending: Hematology and Oncology | Admitting: Hematology and Oncology

## 2019-11-07 DIAGNOSIS — E039 Hypothyroidism, unspecified: Secondary | ICD-10-CM | POA: Diagnosis not present

## 2019-11-07 DIAGNOSIS — Z86010 Personal history of colonic polyps: Secondary | ICD-10-CM | POA: Diagnosis not present

## 2019-11-07 DIAGNOSIS — K754 Autoimmune hepatitis: Secondary | ICD-10-CM | POA: Insufficient documentation

## 2019-11-07 DIAGNOSIS — Z87891 Personal history of nicotine dependence: Secondary | ICD-10-CM | POA: Diagnosis not present

## 2019-11-07 DIAGNOSIS — D696 Thrombocytopenia, unspecified: Secondary | ICD-10-CM | POA: Insufficient documentation

## 2019-11-07 DIAGNOSIS — K7460 Unspecified cirrhosis of liver: Secondary | ICD-10-CM | POA: Diagnosis not present

## 2019-11-07 DIAGNOSIS — Z8673 Personal history of transient ischemic attack (TIA), and cerebral infarction without residual deficits: Secondary | ICD-10-CM | POA: Diagnosis not present

## 2019-11-07 DIAGNOSIS — T451X5A Adverse effect of antineoplastic and immunosuppressive drugs, initial encounter: Secondary | ICD-10-CM

## 2019-11-07 DIAGNOSIS — D702 Other drug-induced agranulocytosis: Secondary | ICD-10-CM

## 2019-11-07 DIAGNOSIS — Z79899 Other long term (current) drug therapy: Secondary | ICD-10-CM | POA: Diagnosis not present

## 2019-11-07 DIAGNOSIS — F319 Bipolar disorder, unspecified: Secondary | ICD-10-CM | POA: Diagnosis not present

## 2019-11-07 LAB — CMP (CANCER CENTER ONLY)
ALT: 19 U/L (ref 0–44)
AST: 30 U/L (ref 15–41)
Albumin: 2.9 g/dL — ABNORMAL LOW (ref 3.5–5.0)
Alkaline Phosphatase: 77 U/L (ref 38–126)
Anion gap: 1 — ABNORMAL LOW (ref 5–15)
BUN: 11 mg/dL (ref 8–23)
CO2: 27 mmol/L (ref 22–32)
Calcium: 8.7 mg/dL — ABNORMAL LOW (ref 8.9–10.3)
Chloride: 105 mmol/L (ref 98–111)
Creatinine: 0.71 mg/dL (ref 0.44–1.00)
GFR, Estimated: >60 mL/min (ref >60)
Glucose, Bld: 85 mg/dL (ref 70–99)
Potassium: 4.1 mmol/L (ref 3.5–5.1)
Sodium: 133 mmol/L — ABNORMAL LOW (ref 135–145)
Total Bilirubin: 1 mg/dL (ref 0.3–1.2)
Total Protein: 6.8 g/dL (ref 6.5–8.1)

## 2019-11-07 LAB — CBC WITH DIFFERENTIAL (CANCER CENTER ONLY)
Abs Immature Granulocytes: 0.01 10*3/uL (ref 0.00–0.07)
Basophils Absolute: 0 10*3/uL (ref 0.0–0.1)
Basophils Relative: 0 %
Eosinophils Absolute: 0 10*3/uL (ref 0.0–0.5)
Eosinophils Relative: 1 %
HCT: 35.9 % — ABNORMAL LOW (ref 36.0–46.0)
Hemoglobin: 12.2 g/dL (ref 12.0–15.0)
Immature Granulocytes: 1 %
Lymphocytes Relative: 38 %
Lymphs Abs: 0.4 10*3/uL — ABNORMAL LOW (ref 0.7–4.0)
MCH: 30.7 pg (ref 26.0–34.0)
MCHC: 34 g/dL (ref 30.0–36.0)
MCV: 90.2 fL (ref 80.0–100.0)
Monocytes Absolute: 0.2 10*3/uL (ref 0.1–1.0)
Monocytes Relative: 17 %
Neutro Abs: 0.5 10*3/uL — ABNORMAL LOW (ref 1.7–7.7)
Neutrophils Relative %: 43 %
Platelet Count: 50 10*3/uL — ABNORMAL LOW (ref 150–400)
RBC: 3.98 MIL/uL (ref 3.87–5.11)
RDW: 16.3 % — ABNORMAL HIGH (ref 11.5–15.5)
WBC Count: 1.1 10*3/uL — ABNORMAL LOW (ref 4.0–10.5)
nRBC: 0 % (ref 0.0–0.2)

## 2019-11-07 NOTE — Telephone Encounter (Signed)
Pt called and notified the office is closed Friday and she will have to pick up the contrast on Monday.

## 2019-11-07 NOTE — Assessment & Plan Note (Signed)
02/21/2013: WBC 1.7 08/25/2014: WBC 1.4, platelets 81 05/16/2017: WBC 4.1, platelets 132 03/24/2019: WBC 4.4, pl 78 10/19/19 showed WBC 1.1, ANC 0.6, platelets 52, on 10/26/19 showed WBC 1.3, ANC 0.7, platelets 54, and on 11/02/19 showed WBC 1.2, ANC 0.6, platelets 54.  History of autoimmune hepatitis on immunosuppressive treatments like azathioprine and Prograf This is classic drug-induced pancytopenia.

## 2019-11-08 ENCOUNTER — Telehealth: Payer: Self-pay | Admitting: Hematology and Oncology

## 2019-11-08 NOTE — Telephone Encounter (Signed)
No 10/13 los, no changes made to pt schedule

## 2019-11-12 ENCOUNTER — Telehealth: Payer: Self-pay | Admitting: Gastroenterology

## 2019-11-12 NOTE — Telephone Encounter (Signed)
All Stacey Velazquez CT's have to pick up the contrast at [facility]. At least 3 days before the CT

## 2019-11-14 ENCOUNTER — Ambulatory Visit (HOSPITAL_COMMUNITY)
Admission: RE | Admit: 2019-11-14 | Discharge: 2019-11-14 | Disposition: A | Discharge status: Home / Self Care | Payer: Medicare Other | Source: Ambulatory Visit | Attending: Gastroenterology | Admitting: Gastroenterology

## 2019-11-14 ENCOUNTER — Other Ambulatory Visit: Payer: Self-pay

## 2019-11-14 DIAGNOSIS — R1032 Left lower quadrant pain: Secondary | ICD-10-CM | POA: Diagnosis not present

## 2019-11-14 DIAGNOSIS — I700 Atherosclerosis of aorta: Secondary | ICD-10-CM | POA: Diagnosis not present

## 2019-11-14 DIAGNOSIS — I10 Essential (primary) hypertension: Secondary | ICD-10-CM | POA: Diagnosis not present

## 2019-11-14 DIAGNOSIS — K7460 Unspecified cirrhosis of liver: Secondary | ICD-10-CM | POA: Diagnosis not present

## 2019-11-14 DIAGNOSIS — K5730 Diverticulosis of large intestine without perforation or abscess without bleeding: Secondary | ICD-10-CM | POA: Diagnosis not present

## 2019-11-14 IMAGING — CT CT ABD-PELV W/ CM
2 of 5 series · 16 of 46 positions shown, 18 images · IV contrast (OMNIPAQUE)
Comparison: [DATE]

CLINICAL DATA: Left lower quadrant pain

EXAM:
CT ABDOMEN AND PELVIS WITH CONTRAST
TECHNIQUE: Multidetector CT imaging of the abdomen and pelvis was performed
using the standard protocol following bolus administration of
intravenous contrast.
CONTRAST:  100mL OMNIPAQUE IOHEXOL 300 MG/ML  SOLN

[Series 2: axial st · axial · 0.98mm/px · z∈[+49,+429]mm · 13 of 90 slices shown, 15 images]
[im 7/90  soft-tissue]
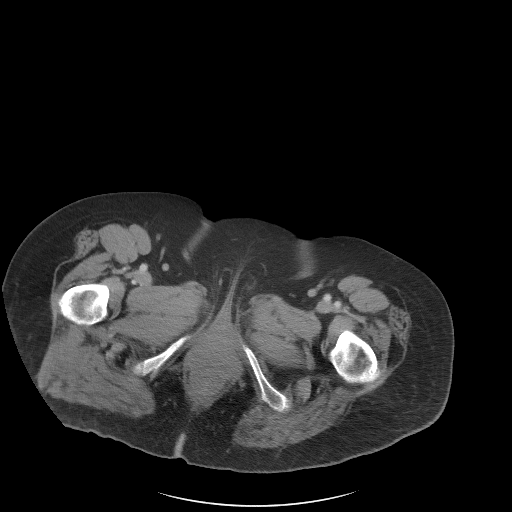
[im 7/90  bone]
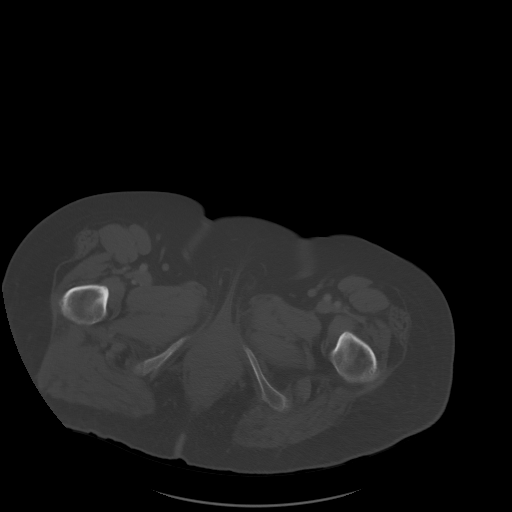
[im 13/90  soft-tissue]
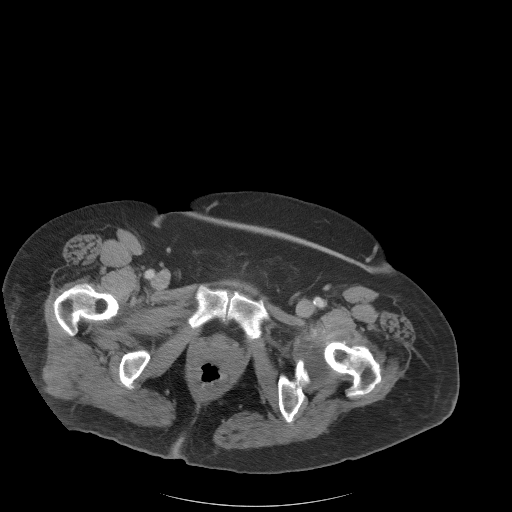
[im 20/90  soft-tissue]
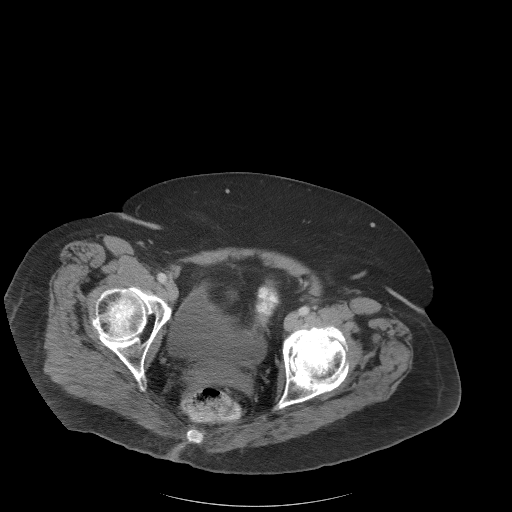
[im 26/90  soft-tissue]
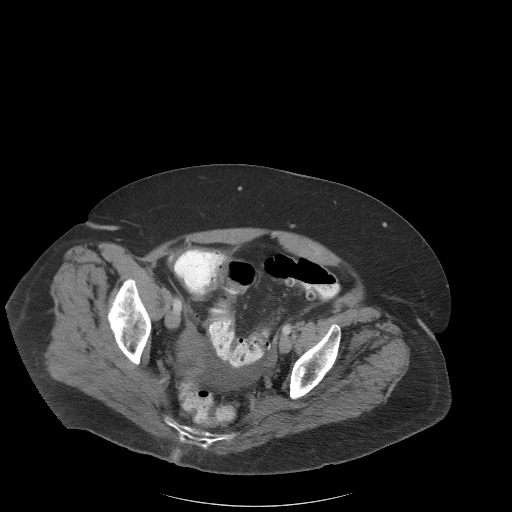
[im 32/90  soft-tissue]
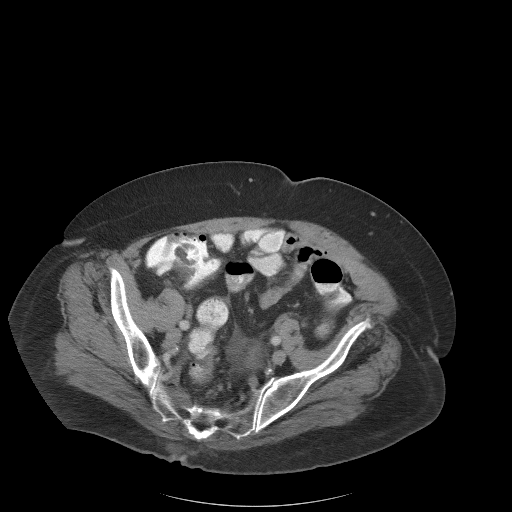
[im 39/90  soft-tissue]
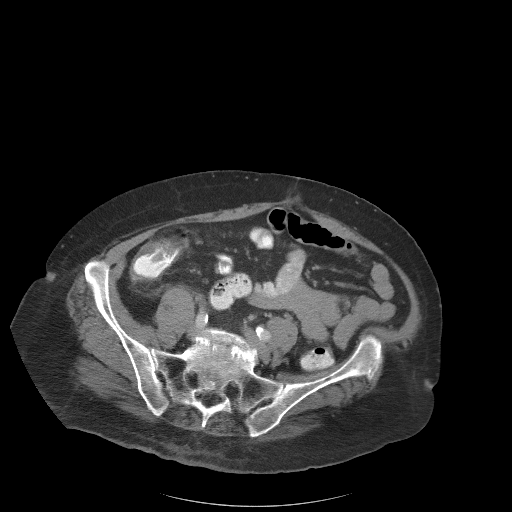
[im 45/90  soft-tissue]
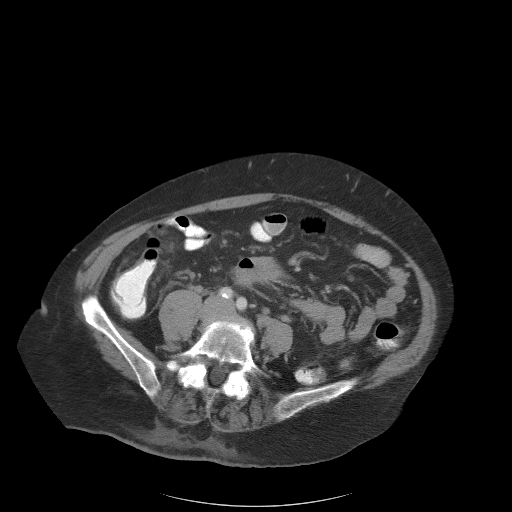
[im 51/90  soft-tissue]
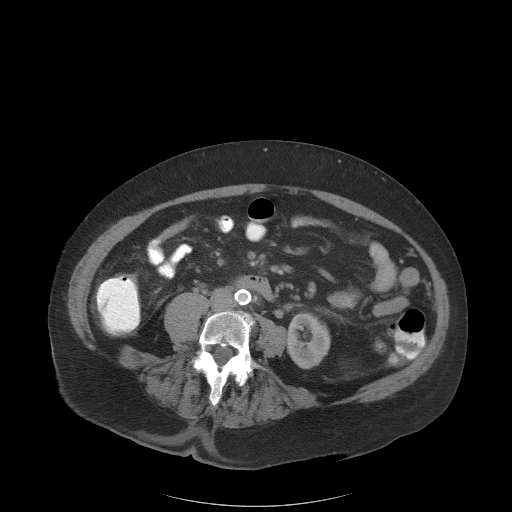
[im 58/90  soft-tissue]
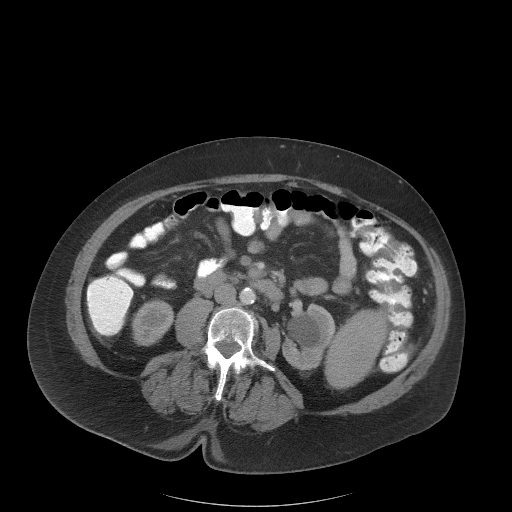
[im 58/90  bone]
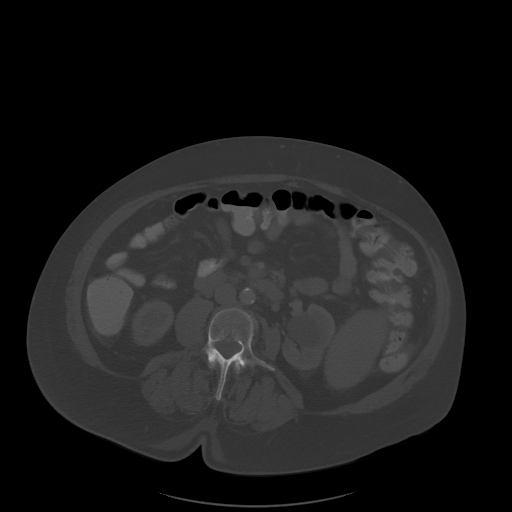
[im 64/90  soft-tissue]
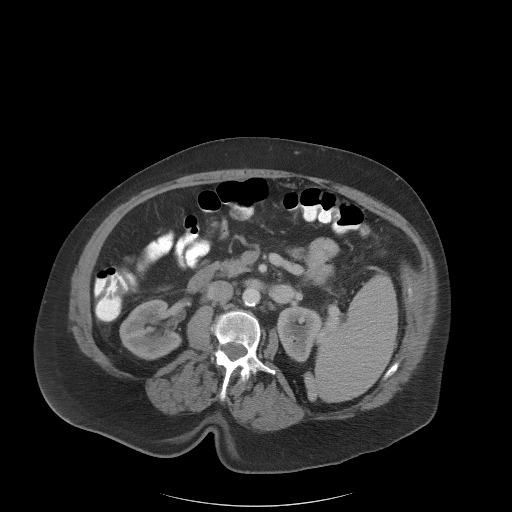
[im 70/90  soft-tissue]
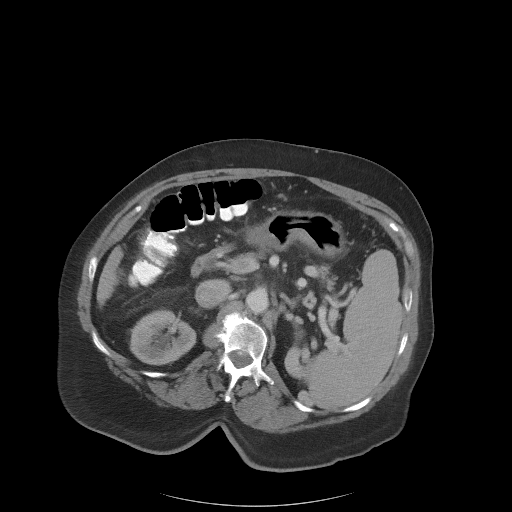
[im 77/90  soft-tissue]
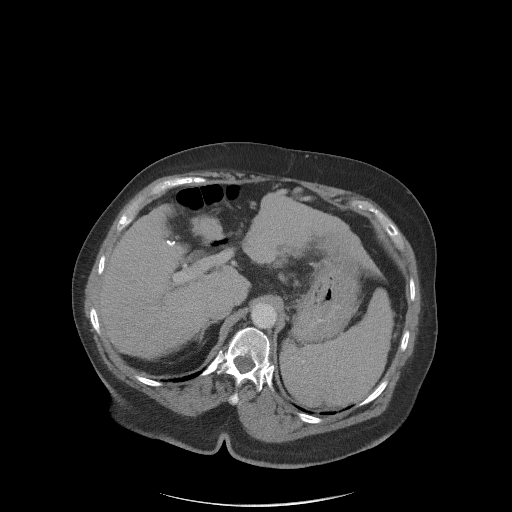
[im 83/90  soft-tissue]
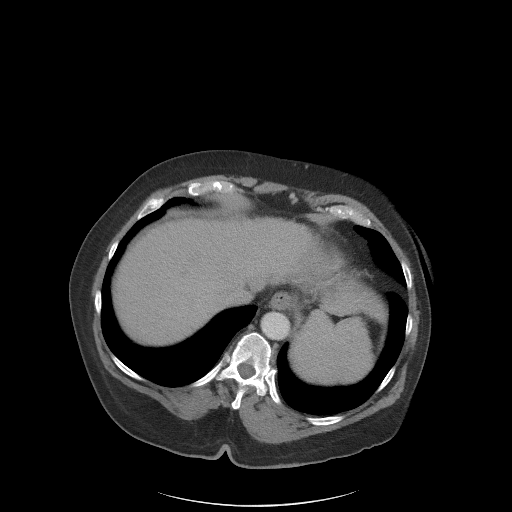

[Series 4: coronal st · coronal · 0.86mm/px · 3 of 98 slices shown]
[im 33/98  soft-tissue]
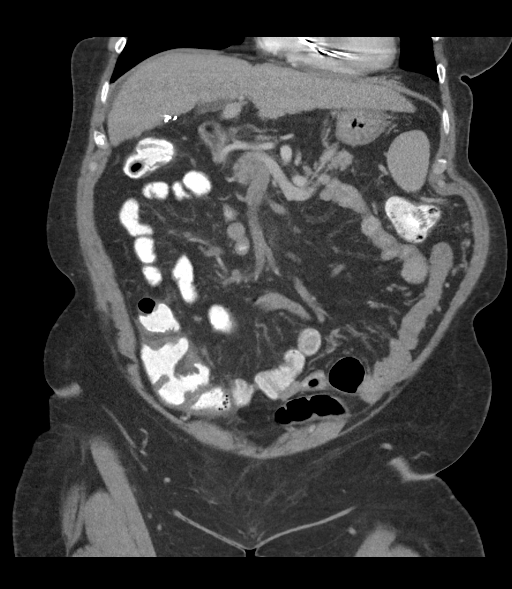
[im 44/98  soft-tissue]
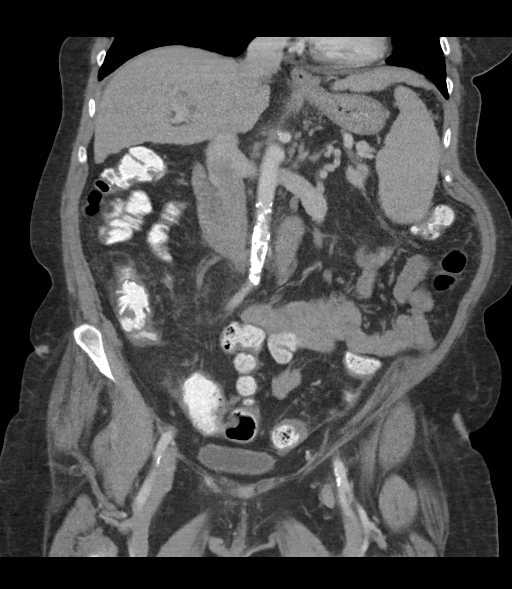
[im 54/98  soft-tissue]
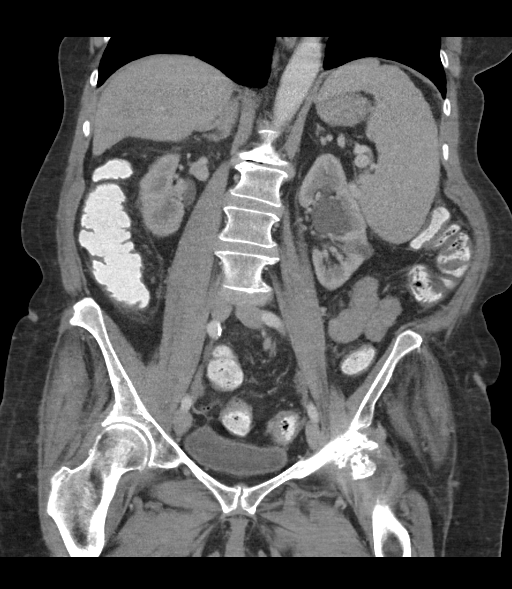

[16 of 46 positions shown; findings below may reference images not displayed]

FINDINGS: Lower chest: Lung bases are clear. No effusions. Heart is normal
size.

Hepatobiliary: Nodular contours of the liver compatible with
cirrhosis. Prior cholecystectomy. No focal hepatic abnormality.

Pancreas: No focal abnormality or ductal dilatation.

Spleen: Splenomegaly with a craniocaudal length of 16 cm.

Adrenals/Urinary Tract: Left renal parapelvic cyst, stable. No
hydronephrosis. Adrenal glands and urinary bladder unremarkable.

Stomach/Bowel: Sigmoid diverticulosis. No active diverticulitis. No
evidence of bowel obstruction.

Vascular/Lymphatic: Aortic atherosclerosis. No evidence of aneurysm
or adenopathy. Spontaneous splenorenal shunt noted related to portal
venous hypertension.

Reproductive: Prior hysterectomy.  No adnexal masses.

Other: Small amount of free fluid in the pelvis.

Musculoskeletal: No acute bony abnormality.
IMPRESSION: Cirrhosis with associated splenomegaly. Spontaneous splenorenal
shunt noted.

Aortic atherosclerosis.

Sigmoid diverticulosis.

Small amount of free fluid in the pelvis.

## 2019-11-14 MED ORDER — IOHEXOL 300 MG/ML  SOLN
100.0000 mL | Freq: Once | INTRAMUSCULAR | Status: AC | PRN
  Administered 2019-11-14: 100 mL via INTRAVENOUS

## 2019-11-16 ENCOUNTER — Emergency Department (HOSPITAL_COMMUNITY)
Admission: EM | Admit: 2019-11-16 | Discharge: 2019-11-16 | Disposition: A | Discharge status: Home / Self Care | Payer: Medicare Other | Attending: Emergency Medicine | Admitting: Emergency Medicine

## 2019-11-16 ENCOUNTER — Other Ambulatory Visit: Payer: Self-pay

## 2019-11-16 ENCOUNTER — Emergency Department (HOSPITAL_COMMUNITY): Payer: Medicare Other

## 2019-11-16 ENCOUNTER — Encounter (HOSPITAL_COMMUNITY): Payer: Self-pay | Admitting: *Deleted

## 2019-11-16 DIAGNOSIS — M79675 Pain in left toe(s): Secondary | ICD-10-CM | POA: Insufficient documentation

## 2019-11-16 DIAGNOSIS — G2119 Other drug induced secondary parkinsonism: Secondary | ICD-10-CM | POA: Diagnosis not present

## 2019-11-16 DIAGNOSIS — E039 Hypothyroidism, unspecified: Secondary | ICD-10-CM | POA: Insufficient documentation

## 2019-11-16 DIAGNOSIS — L089 Local infection of the skin and subcutaneous tissue, unspecified: Secondary | ICD-10-CM | POA: Diagnosis not present

## 2019-11-16 DIAGNOSIS — Z87891 Personal history of nicotine dependence: Secondary | ICD-10-CM | POA: Insufficient documentation

## 2019-11-16 DIAGNOSIS — Z79899 Other long term (current) drug therapy: Secondary | ICD-10-CM | POA: Diagnosis not present

## 2019-11-16 LAB — CBC
HCT: 38.9 % (ref 36.0–46.0)
Hemoglobin: 13.1 g/dL (ref 12.0–15.0)
MCH: 31.3 pg (ref 26.0–34.0)
MCHC: 33.7 g/dL (ref 30.0–36.0)
MCV: 92.8 fL (ref 80.0–100.0)
Platelets: 48 10*3/uL — ABNORMAL LOW (ref 150–400)
RBC: 4.19 MIL/uL (ref 3.87–5.11)
RDW: 16.1 % — ABNORMAL HIGH (ref 11.5–15.5)
WBC: 1.9 10*3/uL — ABNORMAL LOW (ref 4.0–10.5)
nRBC: 0 % (ref 0.0–0.2)

## 2019-11-16 IMAGING — DX DG TOE GREAT 2+V*L*
3 series · 3 of 3 positions shown · non-contrast
Comparison: None.

CLINICAL DATA: Left great toe infection, redness

EXAM:
LEFT GREAT TOE

[toe ap]
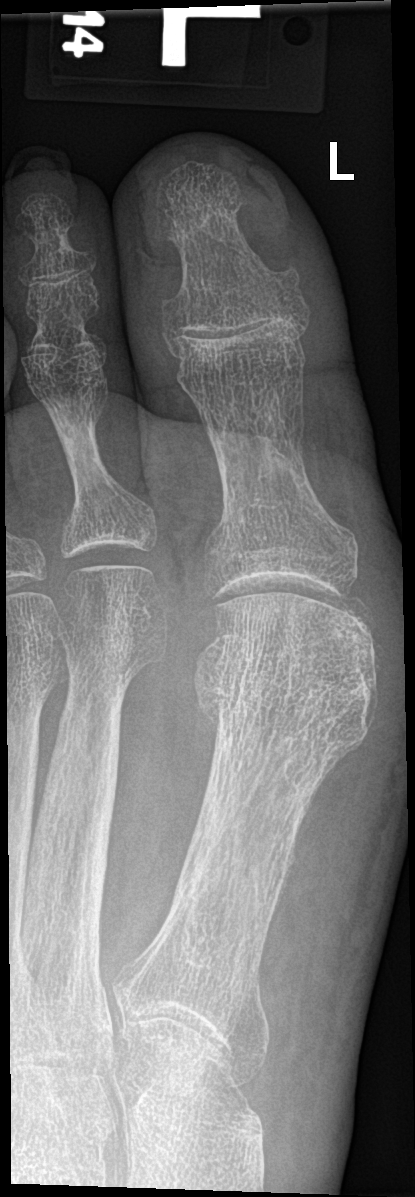

[toe obl]
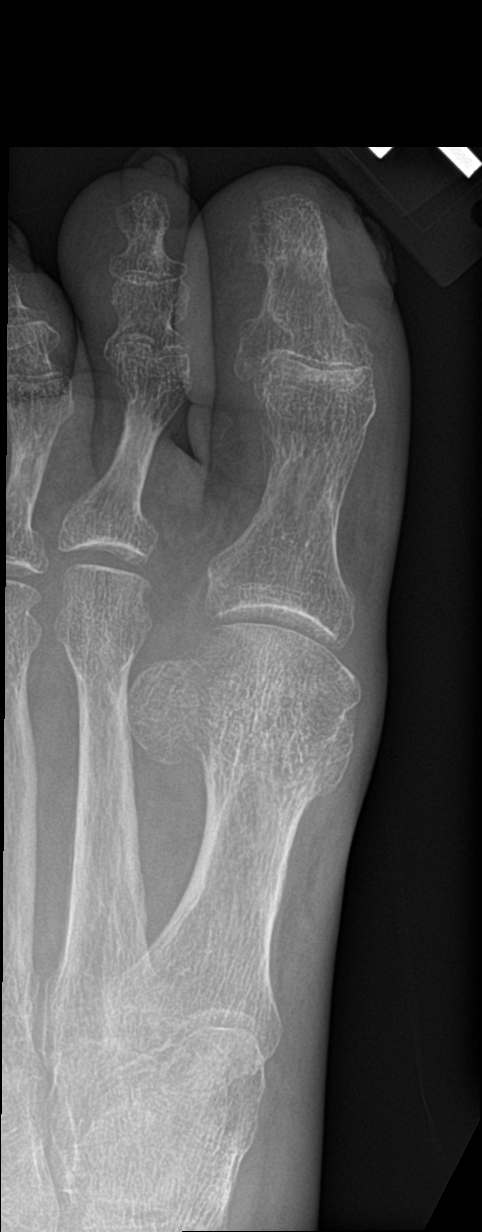

[toe lat]
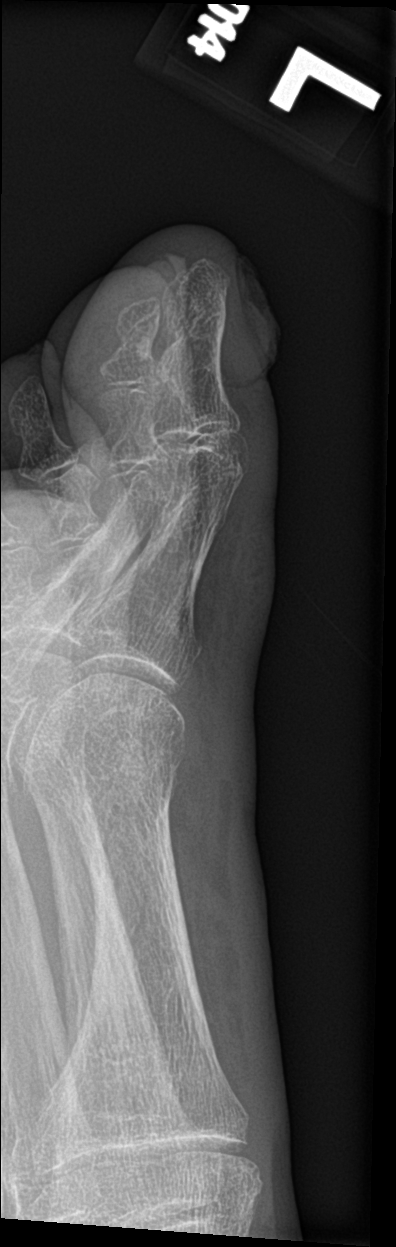

[3 of 3 positions shown; findings below may reference images not displayed]

FINDINGS: There is no evidence of fracture or dislocation. There is no
evidence of arthropathy or other focal bone abnormality. Soft
tissues are unremarkable. No bone destruction to suggest
osteomyelitis.
IMPRESSION: No acute bony abnormality.

## 2019-11-16 NOTE — Discharge Instructions (Addendum)
Follow-up with your podiatrist 

## 2019-11-16 NOTE — ED Triage Notes (Signed)
Pt with left great toe infection since Jan this year, toe red with redness spreading.

## 2019-11-16 NOTE — ED Provider Notes (Signed)
[facility] DEPT Provider Note   CSN: [id number] Arrival date & time: 11/16/19  1233     History Chief Complaint  Patient presents with  . Left toe Infection    Stacey Velazquez is a 74 y.o. female.  74 year old female presents with several month history of left great toe pain.  Has been seen by podiatry for this and they have discussed the need for surgery.  Patient presents now due to increased discomfort.  She denies any new fevers or chills.  No new injury to it.  No new swelling noted.  Is being followed by oncology for drug-induced thrombocytopenia.  Patient has multiple medical allergies and has been on Keflex and Bactrim recently for this infection which is localized to her left great toenail.  Surgical options have been discussed with patient.        Past Medical History:  Diagnosis Date  . Adenomatous polyp of ascending colon   . Anxiety   . Bilateral lower extremity edema    burning sensations  . Bipolar 1 disorder ([place])   . Carpal tunnel syndrome   . Cirrhosis of liver ([place])   . Cutaneous horn   . Drug-induced parkinsonism [facility])    patient unaware  . Esophageal varices ([place])   . Fibromyalgia    patient denies  . Gastric ulcer   . GERD (gastroesophageal reflux disease)   . Gingival abscess 08/26/2016  . Glaucoma 2003   pt unaware  . Heart attack ([place])   . Heart disease   . Hepatitis, autoimmune ([place]) 08/25/2014  . History of iron deficiency anemia   . Hypotension   . Hypothyroidism   . Migraines   . Neutropenia ([place])   . Osteoporosis   . Pacemaker Medtronic    MRI compatible  . Paranoid schizophrenia ([place])   . Polyarthritis   . Polyosteoarthritis   . Rectal mass   . Seizures ([place])    last sz 08/21/14  . Sinus arrest   . Skin cancer    forehead  . SSS (sick sinus syndrome) ([place])   . Stroke ([place])   . Thrombocytopenia ([place])   . Vertigo    bvvp    Patient Active Problem List   Diagnosis Date Noted  . Esophageal  varices determined by endoscopy ([place]) 07/03/2019  . Limited mobility 05/14/2019  . Adenomatous polyp of rectum s/p TEM partial proctectomy 05/09/2019 05/10/2019  . Hepatic cirrhosis ([place]) 04/09/2019  . Obesity, Class II, BMI 35-39.9 04/09/2019  . Hallucinations 03/13/2019  . Rectal bleeding 03/05/2019  . Vertigo   . Heart disease   . GERD (gastroesophageal reflux disease)   . Autoimmune hepatitis ([place])   . Anxiety   . Low back pain 10/18/2016  . Acute right hip pain 08/26/2016  . Seizure disorder ([place]) 10/14/2014  . Drug-induced Parkinson's disease ([place]) 10/14/2014  . Hypothyroidism 09/09/2014  . Azathioprine-induced leukopenia ([place]) 08/25/2014  . Thrombocytopenia due to drugs 08/25/2014  . Syncope   . Sinus arrest   . Pacemaker Medtronic   . Bipolar 1 disorder ([place])   . Paranoid schizophrenia ([place]) 03/25/2012  . Glaucoma 01/25/2001    Past Surgical History:  Procedure Laterality Date  . BIOPSY  03/06/2019   Procedure: BIOPSY;  Surgeon: Thornton Park, MD;  Location: [facility] ENDOSCOPY;  Service: Gastroenterology;;  EGD and COLON  . CATARACT EXTRACTION Left 2008   pt unaware  . CHOLECYSTECTOMY    . COLONOSCOPY WITH PROPOFOL N/A 03/06/2019   Procedure: COLONOSCOPY WITH PROPOFOL;  Surgeon: Thornton Park, MD;  Location: [facility];  Service: Gastroenterology;  Laterality: N/A;  . ESOPHAGOGASTRODUODENOSCOPY (EGD) WITH PROPOFOL N/A 03/06/2019   Procedure: ESOPHAGOGASTRODUODENOSCOPY (EGD) WITH PROPOFOL ;  Surgeon: Thornton Park, MD;  Location: [facility] ENDOSCOPY;  Service: Gastroenterology;  Laterality: N/A;  . KNEE SURGERY Left 1985  . PACEMAKER INSERTION  2014  . PARTIAL HYSTERECTOMY  1979  . PARTIAL PROCTECTOMY BY TEM N/A 05/10/2019   Procedure: TEM PARTIAL PROCTECTOMY OF RECTAL MASS WITH EXCISION RIGHT PERINEAL SKIN MASS;  Surgeon: Michael Boston, MD;  Location: [facility] ORS;  Service: General;  Laterality: N/A;  . POLYPECTOMY  03/06/2019   Procedure: POLYPECTOMY;  Surgeon: Thornton Park, MD;  Location: [facility] ENDOSCOPY;  Service: Gastroenterology;;  . REFRACTIVE SURGERY    . TUBAL LIGATION       OB History   No obstetric history on file.     Family History  Problem Relation Age of Onset  . Heart attack Father   . Heart disease Paternal Uncle   . Heart disease Paternal Grandmother   . Diabetes Paternal Grandmother   . Heart disease Paternal Grandfather   . Breast cancer Mother   . Cancer Maternal Grandmother   . CVA Maternal Grandmother   . Cancer Maternal Grandfather   . CVA Maternal Grandfather   . Diabetes Sister   . Diabetes Brother   . Diabetes Brother   . Stroke Daughter     Social History   Tobacco Use  . Smoking status: Former Smoker    Quit date: 03/02/2003    Years since quitting: 16.7  . Smokeless tobacco: Never Used  Vaping Use  . Vaping Use: Never used  Substance Use Topics  . Alcohol use: No  . Drug use: No    Home Medications Prior to Admission medications   Medication Sig Start Date End Date Taking? Authorizing Provider  azaTHIOprine (IMURAN) 50 MG tablet TAKE TWO TABLETS (100 MG TOTAL) BY MOUTH DAILY. 07/26/19   Thornton Park, MD  levothyroxine (SYNTHROID) 75 MCG tablet Take 1 tablet (75 mcg total) by mouth daily before breakfast. 10/04/19   Libby Maw, MD  mupirocin ointment (BACTROBAN) 2 % Apply 1 application topically 2 (two) times daily. 09/03/19   Criselda Peaches, DPM  omeprazole (PRILOSEC) 20 MG capsule Take 40 mg by mouth daily before breakfast.  03/23/19   [provider]  tacrolimus (PROGRAF) 1 MG capsule Take 1 capsule (1 mg total) by mouth 2 (two) times daily. 10/05/19   Thornton Park, MD  tamsulosin (FLOMAX) 0.4 MG CAPS capsule Take 0.4 mg by mouth daily. 07/16/19   [provider]    Allergies    Neomycin, Ambien [zolpidem tartrate], Amitriptyline, Ampicillin, Anaprox [naproxen sodium], Benadryl [diphenhydramine hcl (sleep)], Cetirizine & related, Cortizone-10 [hydrocortisone], Darvon  [propoxyphene], Diazepam, Diflunisal, Duloxetine, Flexeril [cyclobenzaprine], Lactose intolerance (gi), Lidocaine, Meperidine and related, Metanx [l-methylfolate-algae-b12-b6], Metoclopramide, Morphine and related, Nuprin [ibuprofen], Oxycodone, Penicillins, Propoxyphene, Ranitidine, Vicodin [hydrocodone-acetaminophen], and Tylenol [acetaminophen]  Review of Systems   Review of Systems  All other systems reviewed and are negative.   Physical Exam Updated Vital Signs BP 123/78 (BP Location: Left Arm)   Pulse 73   Temp 97.8 F (36.6 C) (Oral)   Resp 16   Ht 1.753 m (5\' 9" )   Wt 95.7 kg   SpO2 100%   BMI 31.16 kg/m   Physical Exam Vitals and nursing note reviewed.  Constitutional:      General: She is not in acute distress.  Appearance: Normal appearance. She is well-developed. She is not toxic-appearing.  HENT:     Head: Normocephalic and atraumatic.  Eyes:     General: Lids are normal.     Conjunctiva/sclera: Conjunctivae normal.     Pupils: Pupils are equal, round, and reactive to light.  Neck:     Thyroid: No thyroid mass.     Trachea: No tracheal deviation.  Cardiovascular:     Rate and Rhythm: Normal rate and regular rhythm.     Heart sounds: Normal heart sounds. No murmur heard.  No gallop.   Pulmonary:     Effort: Pulmonary effort is normal. No respiratory distress.     Breath sounds: Normal breath sounds. No stridor. No decreased breath sounds, wheezing, rhonchi or rales.  Abdominal:     General: Bowel sounds are normal. There is no distension.     Palpations: Abdomen is soft.     Tenderness: There is no abdominal tenderness. There is no rebound.  Musculoskeletal:        General: No tenderness. Normal range of motion.     Cervical back: Normal range of motion and neck supple.     Comments: Left great toe with out active drainage.  Nail has changes concerning for possible fungal infection.  No severe erythema or edema noted to the right great toe or the top or  bottom of the foot.  No lymphangitis.  Skin:    General: Skin is warm and dry.     Findings: No abrasion or rash.  Neurological:     Mental Status: She is alert and oriented to person, place, and time.     GCS: GCS eye subscore is 4. GCS verbal subscore is 5. GCS motor subscore is 6.     Cranial Nerves: No cranial nerve deficit.     Sensory: No sensory deficit.  Psychiatric:        Speech: Speech normal.        Behavior: Behavior normal.     ED Results / Procedures / Treatments   Labs (all labs ordered are listed, but only abnormal results are displayed) Labs Reviewed  CBC    EKG None  Radiology CT Abdomen Pelvis W Contrast  Result Date: 11/15/2019 CLINICAL DATA:  Left lower quadrant pain EXAM: CT ABDOMEN AND PELVIS WITH CONTRAST TECHNIQUE: Multidetector CT imaging of the abdomen and pelvis was performed using the standard protocol following bolus administration of intravenous contrast. CONTRAST:  148mL OMNIPAQUE IOHEXOL 300 MG/ML  SOLN COMPARISON:  06/05/2019 FINDINGS: Lower chest: Lung bases are clear. No effusions. Heart is normal size. Hepatobiliary: Nodular contours of the liver compatible with cirrhosis. Prior cholecystectomy. No focal hepatic abnormality. Pancreas: No focal abnormality or ductal dilatation. Spleen: Splenomegaly with a craniocaudal length of 16 cm. Adrenals/Urinary Tract: Left renal parapelvic cyst, stable. No hydronephrosis. Adrenal glands and urinary bladder unremarkable. Stomach/Bowel: Sigmoid diverticulosis. No active diverticulitis. No evidence of bowel obstruction. Vascular/Lymphatic: Aortic atherosclerosis. No evidence of aneurysm or adenopathy. Spontaneous splenorenal shunt noted related to portal venous hypertension. Reproductive: Prior hysterectomy.  No adnexal masses. Other: Small amount of free fluid in the pelvis. Musculoskeletal: No acute bony abnormality. IMPRESSION: Cirrhosis with associated splenomegaly. Spontaneous splenorenal shunt noted. Aortic  atherosclerosis. Sigmoid diverticulosis. Small amount of free fluid in the pelvis. Electronically Signed   By: Rolm Baptise M.D.   On: 11/15/2019 17:34    Procedures Procedures (including critical care time)  Medications Ordered in ED Medications - No data to display  ED Course  I have reviewed the triage vital signs and the nursing notes.  Pertinent labs & imaging results that were available during my care of the patient were reviewed by me and considered in my medical decision making (see chart for details).    MDM Rules/Calculators/A&P                         Patient CBC is stable.  X-ray of is without signs of osteomyelitis.  Patient has multiple medical allergies to different medications and therefore cannot prescribe any analgesics.  Will refer patient back to her podiatrist Final Clinical Impression(s) / ED Diagnoses Final diagnoses:  None    Rx / DC Orders ED Discharge Orders    None       Lacretia Leigh, MD 11/16/19 1549

## 2019-11-16 NOTE — Progress Notes (Signed)
Thank you. This sounds like it needs to be evaluated urgently.

## 2019-11-27 ENCOUNTER — Other Ambulatory Visit: Payer: Self-pay

## 2019-11-27 ENCOUNTER — Telehealth: Payer: Self-pay | Admitting: Gastroenterology

## 2019-11-27 DIAGNOSIS — K754 Autoimmune hepatitis: Secondary | ICD-10-CM

## 2019-11-27 NOTE — Telephone Encounter (Signed)
Pt has been seen by Dr. Lindi Adie at cancer center. Pt calling wanting to know if she needs to continue to have weekly labs here. Please advise.

## 2019-11-27 NOTE — Telephone Encounter (Signed)
Orders in epic and pt aware and will come next week for labs.

## 2019-11-27 NOTE — Telephone Encounter (Signed)
Yes.  Thank you.

## 2019-11-27 NOTE — Telephone Encounter (Signed)
Dr. Geralyn Flash notes reviewed. My move to labs monthly. Thank you.

## 2019-11-27 NOTE — Telephone Encounter (Signed)
Do you want her to have cmet, cbc and Igg monthly?

## 2019-12-03 ENCOUNTER — Other Ambulatory Visit: Payer: Self-pay

## 2019-12-04 ENCOUNTER — Ambulatory Visit (INDEPENDENT_AMBULATORY_CARE_PROVIDER_SITE_OTHER): Payer: Medicare Other

## 2019-12-04 DIAGNOSIS — Z23 Encounter for immunization: Secondary | ICD-10-CM

## 2019-12-04 NOTE — Patient Instructions (Signed)
Health Maintenance Due  Topic Date Due  . Hepatitis C Screening  Never done  . PNA vac Low Risk Adult (2 of 2 - PPSV23) 04/19/2018  . INFLUENZA VACCINE  08/26/2019    Depression screen [facility] 2/9 08/16/2019 03/24/2019 03/20/2019  Decreased Interest 0 0 0  Down, Depressed, Hopeless 0 0 0  PHQ - 2 Score 0 0 0  Altered sleeping - - -  Tired, decreased energy - - -  Change in appetite - - -  Feeling bad or failure about yourself  - - -  Trouble concentrating - - -  Moving slowly or fidgety/restless - - -  Suicidal thoughts - - -  PHQ-9 Score - - -

## 2019-12-04 NOTE — Progress Notes (Signed)
..  After obtaining consent, and per standing order, injection for flu given by [place]. Patient instructed to remain in clinic for 20 minutes afterwards, and to report any adverse reaction to me immediately.

## 2019-12-05 ENCOUNTER — Other Ambulatory Visit: Payer: Self-pay | Admitting: Gastroenterology

## 2019-12-06 ENCOUNTER — Other Ambulatory Visit (INDEPENDENT_AMBULATORY_CARE_PROVIDER_SITE_OTHER): Payer: Medicare Other

## 2019-12-06 ENCOUNTER — Other Ambulatory Visit: Payer: Self-pay

## 2019-12-06 ENCOUNTER — Telehealth: Payer: Self-pay

## 2019-12-06 DIAGNOSIS — K754 Autoimmune hepatitis: Secondary | ICD-10-CM

## 2019-12-06 LAB — CBC WITH DIFFERENTIAL/PLATELET
Basophils Absolute: 0 10*3/uL (ref 0.0–0.1)
Basophils Relative: 0.3 % (ref 0.0–3.0)
Eosinophils Absolute: 0 10*3/uL (ref 0.0–0.7)
Eosinophils Relative: 1.1 % (ref 0.0–5.0)
HCT: 36.1 % (ref 36.0–46.0)
Hemoglobin: 12.2 g/dL (ref 12.0–15.0)
Lymphocytes Relative: 26.6 % (ref 12.0–46.0)
Lymphs Abs: 0.3 10*3/uL — ABNORMAL LOW (ref 0.7–4.0)
MCHC: 33.7 g/dL (ref 30.0–36.0)
MCV: 92.3 fl (ref 78.0–100.0)
Monocytes Absolute: 0.2 10*3/uL (ref 0.1–1.0)
Monocytes Relative: 15.6 % — ABNORMAL HIGH (ref 3.0–12.0)
Neutro Abs: 0.6 10*3/uL — ABNORMAL LOW (ref 1.4–7.7)
Neutrophils Relative %: 56.4 % (ref 43.0–77.0)
Platelets: 41 10*3/uL — CL (ref 150.0–400.0)
RBC: 3.91 Mil/uL (ref 3.87–5.11)
RDW: 16.6 % — ABNORMAL HIGH (ref 11.5–15.5)
WBC: 1.1 10*3/uL — CL (ref 4.0–10.5)

## 2019-12-06 LAB — COMPREHENSIVE METABOLIC PANEL
ALT: 16 U/L (ref 0–35)
AST: 25 U/L (ref 0–37)
Albumin: 2.9 g/dL — ABNORMAL LOW (ref 3.5–5.2)
Alkaline Phosphatase: 67 U/L (ref 39–117)
BUN: 10 mg/dL (ref 6–23)
CO2: 24 mEq/L (ref 19–32)
Calcium: 8.2 mg/dL — ABNORMAL LOW (ref 8.4–10.5)
Chloride: 107 mEq/L (ref 96–112)
Creatinine, Ser: 0.69 mg/dL (ref 0.40–1.20)
GFR: 84.88 mL/min (ref >60.00)
Glucose, Bld: 109 mg/dL — ABNORMAL HIGH (ref 70–99)
Potassium: 3.9 mEq/L (ref 3.5–5.1)
Sodium: 136 mEq/L (ref 135–145)
Total Bilirubin: 1 mg/dL (ref 0.2–1.2)
Total Protein: 6.2 g/dL (ref 6.0–8.3)

## 2019-12-06 NOTE — Telephone Encounter (Signed)
Spoke with pt and she is aware and will take 1 capsule daily of the prograf and repeat labs in 24mth, orders in epic.

## 2019-12-06 NOTE — Telephone Encounter (Signed)
Given these results, I would like for her to reduce her dose of Prograf by 50%. Repeat labs in one week. Monitor closely for any s/sx of infection and seek care urgently if they develop. Thank you.

## 2019-12-06 NOTE — Telephone Encounter (Signed)
Thank you. I will look for the formal report.

## 2019-12-06 NOTE — Telephone Encounter (Signed)
The lab called to report the patient's critical WBC of 1.1 and platelet count of 41.

## 2019-12-07 LAB — IGG: IgG (Immunoglobin G), Serum: 2271 mg/dL — ABNORMAL HIGH (ref 600–1540)

## 2020-01-02 DIAGNOSIS — L570 Actinic keratosis: Secondary | ICD-10-CM | POA: Diagnosis not present

## 2020-01-02 DIAGNOSIS — D220 Melanocytic nevi of lip: Secondary | ICD-10-CM | POA: Diagnosis not present

## 2020-01-02 DIAGNOSIS — Z85828 Personal history of other malignant neoplasm of skin: Secondary | ICD-10-CM | POA: Diagnosis not present

## 2020-01-02 DIAGNOSIS — L720 Epidermal cyst: Secondary | ICD-10-CM | POA: Diagnosis not present

## 2020-01-04 ENCOUNTER — Telehealth: Payer: Self-pay

## 2020-01-04 NOTE — Telephone Encounter (Signed)
-----   Message from Algernon Huxley, RN sent at 12/06/2019 11:23 AM EST ----- Regarding: Lab Pt needs labs, orders in epic.

## 2020-01-04 NOTE — Telephone Encounter (Signed)
Spoke with pt and let her know she is due for labs. Orders are in epic.

## 2020-01-05 ENCOUNTER — Other Ambulatory Visit: Payer: Self-pay | Admitting: Family Medicine

## 2020-01-05 DIAGNOSIS — E039 Hypothyroidism, unspecified: Secondary | ICD-10-CM

## 2020-01-07 ENCOUNTER — Other Ambulatory Visit (INDEPENDENT_AMBULATORY_CARE_PROVIDER_SITE_OTHER): Payer: Medicare Other

## 2020-01-07 ENCOUNTER — Telehealth: Payer: Self-pay | Admitting: *Deleted

## 2020-01-07 DIAGNOSIS — K754 Autoimmune hepatitis: Secondary | ICD-10-CM

## 2020-01-07 LAB — CBC WITH DIFFERENTIAL/PLATELET
Basophils Absolute: 0 10*3/uL (ref 0.0–0.1)
Basophils Relative: 0.5 % (ref 0.0–3.0)
Eosinophils Absolute: 0 10*3/uL (ref 0.0–0.7)
Eosinophils Relative: 1.3 % (ref 0.0–5.0)
HCT: 36.7 % (ref 36.0–46.0)
Hemoglobin: 12.4 g/dL (ref 12.0–15.0)
Lymphocytes Relative: 31.3 % (ref 12.0–46.0)
Lymphs Abs: 0.4 10*3/uL — ABNORMAL LOW (ref 0.7–4.0)
MCHC: 33.9 g/dL (ref 30.0–36.0)
MCV: 92.2 fl (ref 78.0–100.0)
Monocytes Absolute: 0.2 10*3/uL (ref 0.1–1.0)
Monocytes Relative: 14 % — ABNORMAL HIGH (ref 3.0–12.0)
Neutro Abs: 0.7 10*3/uL — ABNORMAL LOW (ref 1.4–7.7)
Neutrophils Relative %: 52.9 % (ref 43.0–77.0)
Platelets: 47 10*3/uL — CL (ref 150.0–400.0)
RBC: 3.98 Mil/uL (ref 3.87–5.11)
RDW: 15.3 % (ref 11.5–15.5)
WBC: 1.4 10*3/uL — CL (ref 4.0–10.5)

## 2020-01-07 LAB — COMPREHENSIVE METABOLIC PANEL
ALT: 17 U/L (ref 0–35)
AST: 25 U/L (ref 0–37)
Albumin: 2.9 g/dL — ABNORMAL LOW (ref 3.5–5.2)
Alkaline Phosphatase: 79 U/L (ref 39–117)
BUN: 9 mg/dL (ref 6–23)
CO2: 26 mEq/L (ref 19–32)
Calcium: 8.6 mg/dL (ref 8.4–10.5)
Chloride: 105 mEq/L (ref 96–112)
Creatinine, Ser: 0.61 mg/dL (ref 0.40–1.20)
GFR: 87.38 mL/min (ref >60.00)
Glucose, Bld: 78 mg/dL (ref 70–99)
Potassium: 3.8 mEq/L (ref 3.5–5.1)
Sodium: 136 mEq/L (ref 135–145)
Total Bilirubin: 0.9 mg/dL (ref 0.2–1.2)
Total Protein: 6.6 g/dL (ref 6.0–8.3)

## 2020-01-07 NOTE — Telephone Encounter (Signed)
I have received a call from Santiago Glad in the lab indicating that patient's WBC count is critical, at 1.4 and platelet count is critical at 47.

## 2020-01-07 NOTE — Telephone Encounter (Signed)
Thank you :)

## 2020-01-08 LAB — IGG: IgG (Immunoglobin G), Serum: 2462 mg/dL — ABNORMAL HIGH (ref 600–1540)

## 2020-01-10 ENCOUNTER — Ambulatory Visit: Payer: Medicare Other | Admitting: Family Medicine

## 2020-01-14 ENCOUNTER — Other Ambulatory Visit: Payer: Self-pay

## 2020-01-14 DIAGNOSIS — K754 Autoimmune hepatitis: Secondary | ICD-10-CM

## 2020-01-14 DIAGNOSIS — D709 Neutropenia, unspecified: Secondary | ICD-10-CM

## 2020-01-14 DIAGNOSIS — K7469 Other cirrhosis of liver: Secondary | ICD-10-CM

## 2020-01-14 NOTE — Telephone Encounter (Signed)
Pt calling for lab results. Please advise. 

## 2020-01-14 NOTE — Telephone Encounter (Signed)
Reviewed as a result note.

## 2020-01-14 NOTE — Telephone Encounter (Signed)
Patient is calling requesting lab results.

## 2020-01-31 ENCOUNTER — Other Ambulatory Visit (INDEPENDENT_AMBULATORY_CARE_PROVIDER_SITE_OTHER): Payer: Medicare Other

## 2020-01-31 ENCOUNTER — Telehealth: Payer: Self-pay

## 2020-01-31 DIAGNOSIS — K754 Autoimmune hepatitis: Secondary | ICD-10-CM | POA: Diagnosis not present

## 2020-01-31 DIAGNOSIS — D709 Neutropenia, unspecified: Secondary | ICD-10-CM

## 2020-01-31 DIAGNOSIS — K7469 Other cirrhosis of liver: Secondary | ICD-10-CM

## 2020-01-31 LAB — CBC WITH DIFFERENTIAL/PLATELET
Basophils Absolute: 0 10*3/uL (ref 0.0–0.1)
Basophils Relative: 0.7 % (ref 0.0–3.0)
Eosinophils Absolute: 0 10*3/uL (ref 0.0–0.7)
Eosinophils Relative: 1.2 % (ref 0.0–5.0)
HCT: 38.3 % (ref 36.0–46.0)
Hemoglobin: 13 g/dL (ref 12.0–15.0)
Lymphocytes Relative: 27.9 % (ref 12.0–46.0)
Lymphs Abs: 0.4 10*3/uL — ABNORMAL LOW (ref 0.7–4.0)
MCHC: 33.8 g/dL (ref 30.0–36.0)
MCV: 91.9 fl (ref 78.0–100.0)
Monocytes Absolute: 0.2 10*3/uL (ref 0.1–1.0)
Monocytes Relative: 12.4 % — ABNORMAL HIGH (ref 3.0–12.0)
Neutro Abs: 0.9 10*3/uL — ABNORMAL LOW (ref 1.4–7.7)
Neutrophils Relative %: 57.8 % (ref 43.0–77.0)
Platelets: 52 10*3/uL — ABNORMAL LOW (ref 150.0–400.0)
RBC: 4.17 Mil/uL (ref 3.87–5.11)
RDW: 15.2 % (ref 11.5–15.5)
WBC: 1.6 10*3/uL — CL (ref 4.0–10.5)

## 2020-01-31 LAB — HEPATIC FUNCTION PANEL
ALT: 18 U/L (ref 0–35)
AST: 26 U/L (ref 0–37)
Albumin: 3.1 g/dL — ABNORMAL LOW (ref 3.5–5.2)
Alkaline Phosphatase: 78 U/L (ref 39–117)
Bilirubin, Direct: 0.2 mg/dL (ref 0.0–0.3)
Total Bilirubin: 1 mg/dL (ref 0.2–1.2)
Total Protein: 6.7 g/dL (ref 6.0–8.3)

## 2020-01-31 NOTE — Telephone Encounter (Signed)
All labs will be pushed into her chart once the chemistries are complete.  WBC 1.6

## 2020-01-31 NOTE — Telephone Encounter (Signed)
WBC 1.6  Dr Tarri Glenn, MD will be notified.

## 2020-01-31 NOTE — Telephone Encounter (Signed)
Noted. Thanks.

## 2020-02-01 ENCOUNTER — Ambulatory Visit (INDEPENDENT_AMBULATORY_CARE_PROVIDER_SITE_OTHER): Payer: Medicare Other

## 2020-02-01 DIAGNOSIS — R55 Syncope and collapse: Secondary | ICD-10-CM

## 2020-02-01 LAB — CUP PACEART REMOTE DEVICE CHECK
Battery Remaining Longevity: 47 mo
Battery Voltage: 2.96 V
Brady Statistic AP VP Percent: 0.03 %
Brady Statistic AP VS Percent: 4.7 %
Brady Statistic AS VP Percent: 0.08 %
Brady Statistic AS VS Percent: 95.2 %
Brady Statistic RA Percent Paced: 4.72 %
Brady Statistic RV Percent Paced: 0.11 %
Date Time Interrogation Session: 20220107081352
Implantable Lead Implant Date: 20141205
Implantable Lead Implant Date: 20141205
Implantable Lead Location: 753859
Implantable Lead Location: 753860
Implantable Lead Model: 5076
Implantable Lead Model: 5076
Implantable Pulse Generator Implant Date: 20141205
Lead Channel Impedance Value: 304 Ohm
Lead Channel Impedance Value: 361 Ohm
Lead Channel Impedance Value: 380 Ohm
Lead Channel Impedance Value: 399 Ohm
Lead Channel Pacing Threshold Amplitude: 0.375 V
Lead Channel Pacing Threshold Amplitude: 1 V
Lead Channel Pacing Threshold Pulse Width: 0.4 ms
Lead Channel Pacing Threshold Pulse Width: 0.4 ms
Lead Channel Sensing Intrinsic Amplitude: 2 mV
Lead Channel Sensing Intrinsic Amplitude: 2 mV
Lead Channel Sensing Intrinsic Amplitude: 3.875 mV
Lead Channel Sensing Intrinsic Amplitude: 3.875 mV
Lead Channel Setting Pacing Amplitude: 2 V
Lead Channel Setting Pacing Amplitude: 2.5 V
Lead Channel Setting Pacing Pulse Width: 0.4 ms
Lead Channel Setting Sensing Sensitivity: 0.6 mV

## 2020-02-01 LAB — IGG: IgG (Immunoglobin G), Serum: 2411 mg/dL — ABNORMAL HIGH (ref 600–1540)

## 2020-02-04 ENCOUNTER — Telehealth: Payer: Self-pay | Admitting: Internal Medicine

## 2020-02-04 NOTE — Telephone Encounter (Signed)
New message:    Patient calling stating that she has been having some trouble with her pacemaker, there is not that appeared around it  Is sticking out and would like for some to call her and she needs a f/u apt, but wanted to speak with the doctor first. Please advise.

## 2020-02-04 NOTE — Telephone Encounter (Signed)
Patient reports that her pacemaker appears to change position at times and looks more prominent . She has no pain, edema , discoloration or drainage from pacemaker site. She reports that she has not lost weight. She sent a transmission 02/01/20 that showed normal device function with no events. Due for yearly f/u with Dr Caryl Comes in March . Will schedule follow-up for February at patient request. Patient needs 3 days notice to schedule transportation to appointments.

## 2020-02-12 ENCOUNTER — Ambulatory Visit: Payer: Medicare Other | Admitting: Family Medicine

## 2020-02-14 ENCOUNTER — Encounter: Payer: Self-pay | Admitting: Gastroenterology

## 2020-02-14 ENCOUNTER — Other Ambulatory Visit: Payer: Self-pay

## 2020-02-14 ENCOUNTER — Other Ambulatory Visit (INDEPENDENT_AMBULATORY_CARE_PROVIDER_SITE_OTHER): Payer: Medicare Other

## 2020-02-14 ENCOUNTER — Ambulatory Visit (INDEPENDENT_AMBULATORY_CARE_PROVIDER_SITE_OTHER): Payer: Medicare Other | Admitting: Gastroenterology

## 2020-02-14 VITALS — BP 123/64 | HR 78 | Ht 69.0 in | Wt 211.0 lb

## 2020-02-14 DIAGNOSIS — D6959 Other secondary thrombocytopenia: Secondary | ICD-10-CM

## 2020-02-14 DIAGNOSIS — K754 Autoimmune hepatitis: Secondary | ICD-10-CM

## 2020-02-14 DIAGNOSIS — R1032 Left lower quadrant pain: Secondary | ICD-10-CM

## 2020-02-14 DIAGNOSIS — D709 Neutropenia, unspecified: Secondary | ICD-10-CM | POA: Diagnosis not present

## 2020-02-14 DIAGNOSIS — K259 Gastric ulcer, unspecified as acute or chronic, without hemorrhage or perforation: Secondary | ICD-10-CM

## 2020-02-14 DIAGNOSIS — K862 Cyst of pancreas: Secondary | ICD-10-CM | POA: Diagnosis not present

## 2020-02-14 DIAGNOSIS — T50905A Adverse effect of unspecified drugs, medicaments and biological substances, initial encounter: Secondary | ICD-10-CM

## 2020-02-14 DIAGNOSIS — R748 Abnormal levels of other serum enzymes: Secondary | ICD-10-CM

## 2020-02-14 DIAGNOSIS — K7469 Other cirrhosis of liver: Secondary | ICD-10-CM

## 2020-02-14 DIAGNOSIS — I8500 Esophageal varices without bleeding: Secondary | ICD-10-CM

## 2020-02-14 LAB — COMPREHENSIVE METABOLIC PANEL
ALT: 20 U/L (ref 0–35)
AST: 27 U/L (ref 0–37)
Albumin: 3.2 g/dL — ABNORMAL LOW (ref 3.5–5.2)
Alkaline Phosphatase: 82 U/L (ref 39–117)
BUN: 10 mg/dL (ref 6–23)
CO2: 25 mEq/L (ref 19–32)
Calcium: 8.9 mg/dL (ref 8.4–10.5)
Chloride: 105 mEq/L (ref 96–112)
Creatinine, Ser: 0.63 mg/dL (ref 0.40–1.20)
GFR: 86.64 mL/min (ref >60.00)
Glucose, Bld: 85 mg/dL (ref 70–99)
Potassium: 3.6 mEq/L (ref 3.5–5.1)
Sodium: 134 mEq/L — ABNORMAL LOW (ref 135–145)
Total Bilirubin: 1.1 mg/dL (ref 0.2–1.2)
Total Protein: 7 g/dL (ref 6.0–8.3)

## 2020-02-14 LAB — PROTIME-INR
INR: 1.4 ratio — ABNORMAL HIGH (ref 0.8–1.0)
Prothrombin Time: 15.7 s — ABNORMAL HIGH (ref 9.6–13.1)

## 2020-02-14 LAB — HEPATIC FUNCTION PANEL
ALT: 20 U/L (ref 0–35)
AST: 27 U/L (ref 0–37)
Albumin: 3.2 g/dL — ABNORMAL LOW (ref 3.5–5.2)
Alkaline Phosphatase: 82 U/L (ref 39–117)
Bilirubin, Direct: 0.2 mg/dL (ref 0.0–0.3)
Total Bilirubin: 1.1 mg/dL (ref 0.2–1.2)
Total Protein: 7 g/dL (ref 6.0–8.3)

## 2020-02-14 LAB — CBC
HCT: 38.4 % (ref 36.0–46.0)
Hemoglobin: 12.9 g/dL (ref 12.0–15.0)
MCHC: 33.8 g/dL (ref 30.0–36.0)
MCV: 91.2 fl (ref 78.0–100.0)
Platelets: 51 10*3/uL — ABNORMAL LOW (ref 150.0–400.0)
RBC: 4.2 Mil/uL (ref 3.87–5.11)
RDW: 14.8 % (ref 11.5–15.5)
WBC: 1.6 10*3/uL — CL (ref 4.0–10.5)

## 2020-02-14 MED ORDER — OMEPRAZOLE 40 MG PO CPDR: 40.0000 mg | DELAYED_RELEASE_CAPSULE | Freq: Two times a day (BID) | ORAL | 3 refills | Status: DC

## 2020-02-14 MED ORDER — TACROLIMUS 1 MG PO CAPS: 3.0000 mg | ORAL_CAPSULE | Freq: Every day | ORAL | 3 refills | Status: DC

## 2020-02-14 MED ORDER — CARVEDILOL 6.25 MG PO TABS: 6.2500 mg | ORAL_TABLET | Freq: Every day | ORAL | 2 refills | Status: DC

## 2020-02-14 NOTE — Progress Notes (Signed)
Referring Provider: Libby Maw Primary Care Physician:  Libby Maw, MD  Chief complaint:  Autoimmune hepatitis   IMPRESSION:  Cirrhosis due to AIH    - 2/21 labs show a MELD of 11, CPT score 7    - hypoalbuminemia, mild coagulopathy, and thrombocytopenia.  Autoimmune hepatitis (AIH) previously on azathioprine    - Diagnosed in 2006    - Liver biopsy 2006: Grade 3, stage I autoimmune hepatitis    - Previously on azathioprine and steroids    - Last seen in the [facility] hepatology clinic 2014    - Most recently treated by Dr. Benson Norway, last in 2018    - IgG 3501 02/27/19, 3164 05/01/19, 2620 06/21/19    - resumed azathioprine 100 mg QD 02/2019    - switched to Prograf 2/21 due to increasing IgG and progressive pancytopenia Pancreatic tail cystic lesion    - MRI 08/31/19: 0.8cm tail lesion without high risk features, likely side branch IPMN    - follow-up MRI with and without contrast recommended in 2 years Gastric ulcer on EGD 02/2019    - H pylori negative on biopsies    - follow-up recommended in 3 months Esophageal varices on EGD 02/10/16 Benson Norway)    - grade 2 esophageal varices on EGD 02/2019 History of advanced colon polyps    - Multiple colonoscopies with tubular adenomas and tubulovillous adenomas    - Colonoscopy 2018 at [facility]: 65m polyp in the ascending and at the hepatic flexure    - History of 20+mm distal rectal polyp on colonoscopy 03/06/19, 3 TAs, 2 HPs    - 2.6 cm rectal tubulovillous adenoma s/p TEM partial proctectomy 05/10/19 (Johney Maine    - Surveillance colonoscopy due 2024 Uses a walker at home, wheelchair when out of her apartment Transportation issues  Autoimmune hepatitis: Her progressively increasing IgG suggest that she is not responding to azathioprine despite the progressive cytopenia. Azathioprine switch to Prograf given profound cytopenia. Improving IgG and cytopenias with the change. IgG is still elevated but improved to 2,411.   Pancytopenia:  Improving on switch to Prograf. Continue close monitoring and follow-up with hematology as needed.   Cirrhosis by labs and imaging: Continue screening for hepatocellular carcinoma recommended given her history of cirrhosis and esophageal varices. Proceed with cross-sectional screening in 6 months. MRI recommended given her body habitus.   Esophageal varices: Will start low dose non-selective beta-blocker therapy. Reviewed indications and side effect profile.  Gastric ulcer: PPI BID. She is due repeat EGD. Avoid NSAIDs.   Newly identified pancreatic cyst: Will follow with serial imaging. Plan EUS with any changes.   New lesion on the forearm: Dermatology evaluation indicated.    PLAN: - Abstain from all alcohol - Continue tacrolimus 3 mg QD (#30 with 3 refills) - Continue labs every 2 weeks including hepatic function panel and IgG - Semi-annual labs: CMP, CBC, AFP, IgG and PT/INR -today - Continue omeprazole 40 mg BID - Avoid NSAIDs - Add carvedilol 6.25 mg daily - EGD at the hospital to follow-up on the gastric ulcer seen on EGD 03/06/19 - MRI for hepatocellular carcinoma screening - Surveillance Colonoscopy 04/2022 - You need to see a dermatologist given the new lesion on your right forearm - Follow-up in this office in 3-4 months, earlier as needed  I spent over 50 minutes of time, including in depth chart review, independent review of results as outlined above, communicating results with the patient directly, face-to-face time with the patient, coordinating care, ordering  studies and medications as appropriate, and documentation.    HPI: Stacey Velazquez is a 75 y.o. female who returns in scheduled follow-up for cirrhosis due to autoimmune hepatitis.  Her long-term azathioprine was recently switched to Prograf given progressive pancytopenia and loss of response identified as a progressive increasing IgG level.  She is tolerating the switch to Prograf without complication.  She denies  symptoms of jaundice, ascites, encephalopathy.  No new GI complaints today.   New lesion on the right forearm.  She is having trouble finding a podiatrist who will help her with her toe.  Recent Labs: 02/24/15: IgG 3092 02/27/19: IgG 3501 03/24/19 showed normal CMP except for Na 133, glucose 110, calcium 7.9, albumin 2.4, AST 67, ALT 55. WBC 4.4, hgb 13.4, platelets 78, MCV 94.5, INR 1.4.   05/01/19: TB 1.0, AST 32, ALT 17, alk phos 72, alb 2.9, WBC 1.8, hgb 13.1, platelets 77, ANC 1.0, ALC 0.5, IgG 3164 06/21/19: TB 0.8, AST 32, ALT 19, alk phos 67, alb 2.9, WBC 1.5, hgb 12, platelets 90, ANC 0.8, ALT 0.5, IgG 2620 07/19/19: TB 1.0, AST 30, ALT 17, alk phos 65, alb 2.9, WBC 1.3, ANC 0.7, ALC 0.5, hgb 11.7, platelets 65 08/17/2019: Total bilirubin 0.8, AST 33, ALT 19, alk phos 65, albumin 3.0, WBC 1.3, hemoglobin 12.1, platelets 63, IgG 2497 09/18/2019: TB 0.8, AST 34, ALT 19, alk phos 66, albumin 2.9, WBC 1.2, hemoglobin 12.0, platelets 57, IgG 2633 10/19/2019: T bilirubin 0.8, AST 29, ALT 19, alk phos 73, albumin 3.0, WBC 1.1, hemoglobin 12.5, platelets 52, IgG 2568 10/26/2019: WBC 1.3, hemoglobin 12.2, platelets 54 11/02/2019: Total bilirubin 0.9, AST 29, ALT 20, alk phos 67, albumin 2.9, WBC 1.2, hemoglobin 12.3, platelets 54, IgG 2376 12/06/2019: White count 1.1, hemoglobin 12.2, platelets 41, normal liver enzymes, IgG 2271 01/07/2020: White count 1.4, hemoglobin 12.4, platelets 47, IgG 2462, liver enzymes normal 01/31/2020: White count 1.6, hemoglobin 13, platelets 52, IgG 2411, normal liver enzymes    Endoscopic History: EGD 03/06/19: Grade II varices were found in the middle third of the esophagus. Moderate portal hypertensive gastropathy. Gastritis. One non-bleeding cratered gastric ulcer with no stigmata of bleeding was found on the lesser curvature of the stomach. There was no H pylori.  Colonoscopy with Dr. Benson Norway 02/10/2016 showed 6 polyps.  4 polyps ranged from 3 to 6 mm were removed and the  descending: And transverse colon.  There were 2 polyps ranging from 30 to 40 mm that were biopsied and tattooed in the ascending colon and transverse.  Pathology report showed tubulous villous adenoma of high-grade dysplasia.  The patient was referred to [facility].   Colonoscopy with Dr. Stephanie Acre at [facility] 05/26/16: "The perianal and digital rectal examinations were normal. A greater than 50 mm polyp was found in the proximal ascending colon. The polyp was sessile. Preparations were made for mucosal resection. Saline with methylene blue was injected to raise the lesion. Snare mucosal resection was performed. Resection and retrieval were complete. Coagulation for tissue destruction using snare was successful. A 50 mm polyp was found in the hepatic flexure. The polyp was semi-pedunculated. Preparations were made for mucosal resection. Saline with methylene blue was injected to raise the lesion. Snare mucosal resection was performed. Resection and retrieval were complete. Coagulation for tissue destruction using snare was successful. The exam was otherwise without abnormality on direct and retroflexion  views." Pathology revealed Tubulovillous adenoma (multiple fragments) with multiple foci of high-grade dysplasia.  Surveillance colonoscopy recommended in 1 year.  Colonoscopy 03/06/19 showed a 20 mm polyp in the distal rectum adjacent to the dentate line. Biopsies were consistent with tubulovillous adenoma with high-grade grandular dysplasia. She also had three 1 to 4 mm tubular adenomas and two hyperplastic polyps removed at that time.  TEM partial proctectomy of bleeding rectal mass with Dr. Johney Maine.  Pathology revealed a 2.6 cm tubulovillous adenoma.  Margins were uninvolved by dysplasia.  Recent abdominal imaging: Abdominal ultrasound1/25/2017: changes of cirrhosis with a subtle nodularity to the liver contours.  No focal abnormality.  Prior cholecystectomy.  Abdominal ultrasound 03/05/19: cirrhosis and splenomegaly. There  was no ascites.  MRI 09/01/19: cirrhosis without liver mass. Splenomegaly. No ascites. Collateral gastroesophageal and paraumbilical varices. 0.8 cm pancreatic tail cystic lesion CT abdomen pelvis with contrast 11/15/2019 for lower left quadrant pain: Cirrhosis, splenomegaly, spontaneous splenorenal shunt, sigmoid diverticulosis, aortic atherosclerosis   Past Medical History:  Diagnosis Date  . Adenomatous polyp of ascending colon   . Anxiety   . Bilateral lower extremity edema    burning sensations  . Bipolar 1 disorder ([place])   . Carpal tunnel syndrome   . Cirrhosis of liver ([place])   . Cutaneous horn   . Drug-induced parkinsonism [facility])    patient unaware  . Esophageal varices ([place])   . Fibromyalgia    patient denies  . Gastric ulcer   . GERD (gastroesophageal reflux disease)   . Gingival abscess 08/26/2016  . Glaucoma 2003   pt unaware  . Heart attack ([place])   . Heart disease   . Hepatitis, autoimmune ([place]) 08/25/2014  . History of iron deficiency anemia   . Hypotension   . Hypothyroidism   . Migraines   . Neutropenia ([place])   . Osteoporosis   . Pacemaker [employer]    MRI compatible  . Paranoid schizophrenia ([place])   . Polyarthritis   . Polyosteoarthritis   . Rectal mass   . Seizures ([place])    last sz 08/21/14  . Sinus arrest   . Skin cancer    forehead  . SSS (sick sinus syndrome) ([place])   . Stroke ([place])   . Thrombocytopenia ([place])   . Vertigo    bvvp    Past Surgical History:  Procedure Laterality Date  . BIOPSY  03/06/2019   Procedure: BIOPSY;  Surgeon: Thornton Park, MD;  Location: [facility] ENDOSCOPY;  Service: Gastroenterology;;  EGD and COLON  . CATARACT EXTRACTION Left 2008   pt unaware  . CHOLECYSTECTOMY    . COLONOSCOPY WITH PROPOFOL N/A 03/06/2019   Procedure: COLONOSCOPY WITH PROPOFOL;  Surgeon: Thornton Park, MD;  Location: [facility] ENDOSCOPY;  Service: Gastroenterology;  Laterality: N/A;  . ESOPHAGOGASTRODUODENOSCOPY (EGD) WITH PROPOFOL N/A 03/06/2019   Procedure:  ESOPHAGOGASTRODUODENOSCOPY (EGD) WITH PROPOFOL ;  Surgeon: Thornton Park, MD;  Location: [facility] ENDOSCOPY;  Service: Gastroenterology;  Laterality: N/A;  . KNEE SURGERY Left 1985  . PACEMAKER INSERTION  2014  . PARTIAL HYSTERECTOMY  1979  . PARTIAL PROCTECTOMY BY TEM N/A 05/10/2019   Procedure: TEM PARTIAL PROCTECTOMY OF RECTAL MASS WITH EXCISION RIGHT PERINEAL SKIN MASS;  Surgeon: Michael Boston, MD;  Location: [facility] ORS;  Service: General;  Laterality: N/A;  . POLYPECTOMY  03/06/2019   Procedure: POLYPECTOMY;  Surgeon: Thornton Park, MD;  Location: [facility] ENDOSCOPY;  Service: Gastroenterology;;  . REFRACTIVE SURGERY    . TUBAL LIGATION      Current Outpatient Medications  Medication Sig Dispense Refill  . levothyroxine (SYNTHROID) 75 MCG tablet TAKE 1 TABLET (75 MCG TOTAL) BY MOUTH DAILY BEFORE  BREAKFAST. 90 tablet 1  . omeprazole (PRILOSEC) 20 MG capsule Take 40 mg by mouth daily before breakfast.     . tacrolimus (PROGRAF) 1 MG capsule TAKE 1 CAPSULE (1 MG TOTAL) BY MOUTH 2 (TWO) TIMES DAILY. 60 capsule 2  . tamsulosin (FLOMAX) 0.4 MG CAPS capsule Take 0.4 mg by mouth daily.    Marland Kitchen azaTHIOprine (IMURAN) 50 MG tablet TAKE TWO TABLETS (100 MG TOTAL) BY MOUTH DAILY. (Patient not taking: Reported on 02/14/2020) 60 tablet 3  . mupirocin ointment (BACTROBAN) 2 % Apply 1 application topically 2 (two) times daily. (Patient not taking: Reported on 02/14/2020) 30 g 2   No current facility-administered medications for this visit.    Allergies as of 02/14/2020 - Review Complete 02/14/2020  Allergen Reaction Noted  . Neomycin Nausea And Vomiting 05/10/2019  . Ambien [zolpidem tartrate] Other (See Comments) 02/21/2013  . Amitriptyline Other (See Comments) 02/21/2013  . Ampicillin Other (See Comments) 02/21/2013  . Anaprox [naproxen sodium] Other (See Comments) 02/21/2013  . Benadryl [diphenhydramine hcl (sleep)] Other (See Comments) 02/21/2013  . Cetirizine & related Other (See Comments) 02/21/2013  .  Cortizone-10 [hydrocortisone] Other (See Comments) 02/21/2013  . Darvon [propoxyphene] Other (See Comments) 02/21/2013  . Diazepam Other (See Comments) 02/21/2013  . Diflunisal Swelling and Other (See Comments) 02/21/2013  . Duloxetine Other (See Comments) 02/21/2013  . Flexeril [cyclobenzaprine] Other (See Comments) 02/21/2013  . Lactose intolerance (gi) Swelling and Other (See Comments) 08/23/2014  . Lidocaine Other (See Comments) 02/21/2013  . Meperidine and related Other (See Comments) 02/21/2013  . Metanx [l-methylfolate-algae-b12-b6] Other (See Comments) 02/21/2013  . Metoclopramide Other (See Comments) 02/21/2013  . Morphine and related Other (See Comments) 02/21/2013  . Nuprin [ibuprofen] Other (See Comments) 02/21/2013  . Oxycodone Other (See Comments) 02/21/2013  . Penicillins Other (See Comments) 02/21/2013  . Propoxyphene Other (See Comments) 02/21/2013  . Ranitidine Other (See Comments) 02/21/2013  . Vicodin [hydrocodone-acetaminophen] Other (See Comments) 02/21/2013  . Tylenol [acetaminophen] Rash 02/21/2013    Family History  Problem Relation Age of Onset  . Heart attack Father   . Heart disease Paternal Uncle   . Heart disease Paternal Grandmother   . Diabetes Paternal Grandmother   . Heart disease Paternal Grandfather   . Breast cancer Mother   . Cancer Maternal Grandmother   . CVA Maternal Grandmother   . Cancer Maternal Grandfather   . CVA Maternal Grandfather   . Diabetes Sister   . Diabetes Brother   . Diabetes Brother   . Stroke Daughter     Social History   Socioeconomic History  . Marital status: Widowed    Spouse name: Not on file  . Number of children: Not on file  . Years of education: Not on file  . Highest education level: Not on file  Occupational History  . Occupation: disable  Tobacco Use  . Smoking status: Former Smoker    Quit date: 03/02/2003    Years since quitting: 16.9  . Smokeless tobacco: Never Used  Vaping Use  . Vaping  Use: Never used  Substance and Sexual Activity  . Alcohol use: No  . Drug use: No  . Sexual activity: Not on file  Other Topics Concern  . Not on file  Social History Narrative   Lives alone, "have friends and relatives that come and help me"   caffeine use- coffee -1 cup daily   Social Determinants of Health   Financial Resource Strain: Low Risk   . Difficulty of Paying  Living Expenses: Not very hard  Food Insecurity: No Food Insecurity  . Worried About Charity fundraiser in the Last Year: Never true  . Ran Out of Food in the Last Year: Never true  Transportation Needs: Unmet Transportation Needs  . Lack of Transportation (Medical): Yes  . Lack of Transportation (Non-Medical): Yes  Physical Activity: Inactive  . Days of Exercise per Week: 0 days  . Minutes of Exercise per Session: 0 min  Stress: Stress Concern Present  . Feeling of Stress : To some extent  Social Connections: Moderately Isolated  . Frequency of Communication with Friends and Family: More than three times a week  . Frequency of Social Gatherings with Friends and Family: More than three times a week  . Attends Religious Services: 1 to 4 times per year  . Active Member of Clubs or Organizations: No  . Attends Archivist Meetings: Never  . Marital Status: Widowed  Intimate Partner Violence: Not At Risk  . Fear of Current or Ex-Partner: No  . Emotionally Abused: No  . Physically Abused: No  . Sexually Abused: No    Physical Exam: General:   Alert,  well-nourished, pleasant and cooperative in NAD. Sitting in a wheelchair.  Heart:  Regular rate and rhythm; no murmurs Pulm: Clear anteriorly; no wheezing Abdomen:  Soft. Central obesity. Nontender. Nondistended. Normal bowel sounds. No rebound or guarding. No fluid wave.  LAD: No inguinal or umbilical LAD Extremities:  Without edema. Neurologic:  Alert and  oriented x4;  grossly normal neurologically; no asterixis or clonus. Skin: No jaundice. Palmar  erythema. Spider angioma on the chest wall.  Violaceous lesion on the right forearm.  Psych:  Alert and cooperative. Normal mood and affect.     Jaylan Duggar L. Tarri Glenn, MD, MPH 02/14/2020, 10:41 AM

## 2020-02-14 NOTE — Patient Instructions (Addendum)
I have recommended some labs today to monitor your cirrhosis, autoimmune hepatitis, and response to Prograf.  LABS: Your provider has requested that you go to the basement level for lab work before leaving today. Press "B" on the elevator. The lab is located at the first door on the left as you exit the elevator.  HEALTHCARE LAWS AND MY CHART RESULTS: Due to recent changes in healthcare laws, you may see the results of your imaging and laboratory studies on MyChart before your provider has had a chance to review them.  We understand that in some cases there may be results that are confusing or concerning to you. Not all laboratory results come back in the same time frame and the provider may be waiting for multiple results in order to interpret others.  Please give Stacey Velazquez 48 hours in order for your provider to thoroughly review all the results before contacting the office for clarification of your results.    PLEASE HAVE AN IgG AND A HEPATIC FUNCTION PANEL COMPLETED EVERY 2 WEEKS. YOU WILL HAVE THESE LABS COMPLETED TODAY, THEN AGAIN IN 2 WEEKS.   PLEASE HAVE A COMPLETE METABOLIC PANEL, COMPLETE BLOOD COUNT, AFP, IgG AND PT/INR COMPLETED SEMI ANNUALLY. THESE WILL BE COMPLETED TODAY, THEN AGAIN IN 6 MONTHS.  Your counts are improving on Prograf. I would like for you to stay on Prograf 3 mg daily.   PRESCRIPTION MEDICATION(S): We have sent the following medication(s) to your pharmacy:  . Tacrolimus - please take 3mg  by mouth daily. We have sent 3 refills to your pharmacy today.  Marland Kitchen NOTE: If your medication(s) requires a PRIOR AUTHORIZATION, we will receive notification from your pharmacy. Once received, the process to submit for approval may take up to 7-10 business days. You will be contacted about any denials we have received from your insurance company as well as alternatives recommended by your provider.  I would like for you to start taking carvedilol at betime. This medication reduces the risk for  bleeding from the varices. Please contact me and stop taking the medication if you develop chest pain, discomfort, tightness, or heaviness, dizziness, lightheadedness, or fainting, generalized swelling or swelling of the feet, ankles, or lower legs, shortness of breath or a slow heartbeat.  PRESCRIPTION MEDICATION(S): We have sent the following medication(s) to your pharmacy:  . Carvedilol - please take 6.25mg  by mouth daily  . NOTE: If your medication(s) requires a PRIOR AUTHORIZATION, we will receive notification from your pharmacy. Once received, the process to submit for approval may take up to 7-10 business days. You will be contacted about any denials we have received from your insurance company as well as alternatives recommended by your provider.  It is time for an MRI to look for liver cancer. Your risk is very low, but, liver cancer can be very treatable if we find it early.  MRI:  You have been scheduled for an MRI at [facility] (1st floor of the hospital) on 02/21/20 at 8pm. Please arrive @ 7:30pm for registration.   PREP: Please DO NOT eat or drink anything after 4pm (4 hours) prior to your test. In addition, if you have any metal in your body, a pacemaker or a defibrillator, please inform your ordering provider. The test typically takes 45 minutes to 1 hour to complete.  NEED TO RESCHEDULE? Please call radiology at [phone].  WHY ARE YOU HAVING THIS EXAM? For hepatocellular carcinoma screening  It is time for an upper endoscopy to be sure that  your gastric ulcer has improved.   ENDOSCOPY: You have been scheduled for an endoscopy. Please follow written instructions given to you at your visit today.  INHALERS: If you use inhalers (even only as needed), please bring them with you on the day of your procedure.  I would like for you to talk to Dr. Ethelene Hal about a referral to dermatology to evaluate the spot on your right forearm and to discuss seeing a different  podiatrist for your toe. He should be able to help Stacey Velazquez identify doctors that would be a good fit for your needs. We will not send a referral to him today. We will wait to hear from you before sending a referral to his office.  If you are age 76 or older, your body mass index should be between 23-30. Your Body mass index is 31.16 kg/m. If this is out of the aforementioned range listed, please consider follow up with your Primary Care Provider.  I would like to see you back in the office in 3-4 months, earlier if needed. Please call the office at [phone] to schedule this appointment.  Thank you for trusting me with your gastrointestinal care!    Thornton Park, MD, MPH

## 2020-02-15 LAB — AFP TUMOR MARKER: AFP-Tumor Marker: 2.3 ng/mL

## 2020-02-15 LAB — IGG: IgG (Immunoglobin G), Serum: 2457 mg/dL — ABNORMAL HIGH (ref 600–1540)

## 2020-02-15 NOTE — Progress Notes (Signed)
Remote pacemaker transmission.   

## 2020-02-18 ENCOUNTER — Telehealth: Payer: Self-pay | Admitting: Gastroenterology

## 2020-02-18 NOTE — Telephone Encounter (Signed)
Inbound call from patient stating she has another appointment on 03/10/2020 and needs to reschedule procedure scheduled at [facility] please.

## 2020-02-20 NOTE — Telephone Encounter (Signed)
Pt scheduled for covid screen 04/11/20@9am . EGD scheduled at [facility] 04/15/20@8 :30am. Pt aware of appts. Pt thought she had to have procedure at [facility] due to having a pacemaker. Sheri do you know if this is the case?

## 2020-02-20 NOTE — Telephone Encounter (Signed)
Spoke with pt and she is aware.

## 2020-02-20 NOTE — Telephone Encounter (Signed)
No outpatients at [facility] except for Dr. Rush Landmark.  She is good for [facility] with her pacemaker.

## 2020-02-21 ENCOUNTER — Ambulatory Visit (HOSPITAL_COMMUNITY): Payer: Medicare Other

## 2020-02-25 ENCOUNTER — Other Ambulatory Visit: Payer: Self-pay | Admitting: Gastroenterology

## 2020-02-25 DIAGNOSIS — E876 Hypokalemia: Secondary | ICD-10-CM

## 2020-02-28 ENCOUNTER — Other Ambulatory Visit (INDEPENDENT_AMBULATORY_CARE_PROVIDER_SITE_OTHER): Payer: Medicare Other

## 2020-02-28 DIAGNOSIS — D6959 Other secondary thrombocytopenia: Secondary | ICD-10-CM

## 2020-02-28 DIAGNOSIS — R1032 Left lower quadrant pain: Secondary | ICD-10-CM | POA: Diagnosis not present

## 2020-02-28 DIAGNOSIS — D709 Neutropenia, unspecified: Secondary | ICD-10-CM

## 2020-02-28 DIAGNOSIS — K754 Autoimmune hepatitis: Secondary | ICD-10-CM | POA: Diagnosis not present

## 2020-02-28 DIAGNOSIS — K7469 Other cirrhosis of liver: Secondary | ICD-10-CM

## 2020-02-28 DIAGNOSIS — T50905A Adverse effect of unspecified drugs, medicaments and biological substances, initial encounter: Secondary | ICD-10-CM

## 2020-02-28 DIAGNOSIS — R748 Abnormal levels of other serum enzymes: Secondary | ICD-10-CM | POA: Diagnosis not present

## 2020-02-28 DIAGNOSIS — I8500 Esophageal varices without bleeding: Secondary | ICD-10-CM

## 2020-02-28 DIAGNOSIS — K259 Gastric ulcer, unspecified as acute or chronic, without hemorrhage or perforation: Secondary | ICD-10-CM | POA: Diagnosis not present

## 2020-02-28 DIAGNOSIS — K862 Cyst of pancreas: Secondary | ICD-10-CM | POA: Diagnosis not present

## 2020-02-28 LAB — COMPREHENSIVE METABOLIC PANEL
ALT: 16 U/L (ref 0–35)
AST: 20 U/L (ref 0–37)
Albumin: 2.9 g/dL — ABNORMAL LOW (ref 3.5–5.2)
Alkaline Phosphatase: 81 U/L (ref 39–117)
BUN: 13 mg/dL (ref 6–23)
CO2: 26 mEq/L (ref 19–32)
Calcium: 8.6 mg/dL (ref 8.4–10.5)
Chloride: 104 mEq/L (ref 96–112)
Creatinine, Ser: 0.73 mg/dL (ref 0.40–1.20)
GFR: 80.3 mL/min (ref >60.00)
Glucose, Bld: 83 mg/dL (ref 70–99)
Potassium: 4.3 mEq/L (ref 3.5–5.1)
Sodium: 132 mEq/L — ABNORMAL LOW (ref 135–145)
Total Bilirubin: 0.9 mg/dL (ref 0.2–1.2)
Total Protein: 6.5 g/dL (ref 6.0–8.3)

## 2020-02-28 LAB — HEPATIC FUNCTION PANEL
ALT: 16 U/L (ref 0–35)
AST: 20 U/L (ref 0–37)
Albumin: 2.9 g/dL — ABNORMAL LOW (ref 3.5–5.2)
Alkaline Phosphatase: 81 U/L (ref 39–117)
Bilirubin, Direct: 0.2 mg/dL (ref 0.0–0.3)
Total Bilirubin: 0.9 mg/dL (ref 0.2–1.2)
Total Protein: 6.5 g/dL (ref 6.0–8.3)

## 2020-02-28 LAB — CBC
HCT: 36.4 % (ref 36.0–46.0)
Hemoglobin: 12.5 g/dL (ref 12.0–15.0)
MCHC: 34.3 g/dL (ref 30.0–36.0)
MCV: 91.4 fl (ref 78.0–100.0)
Platelets: 50 10*3/uL — ABNORMAL LOW (ref 150.0–400.0)
RBC: 3.98 Mil/uL (ref 3.87–5.11)
RDW: 14.9 % (ref 11.5–15.5)
WBC: 1.5 10*3/uL — CL (ref 4.0–10.5)

## 2020-02-28 LAB — PROTIME-INR
INR: 1.4 ratio — ABNORMAL HIGH (ref 0.8–1.0)
Prothrombin Time: 15.9 s — ABNORMAL HIGH (ref 9.6–13.1)

## 2020-02-29 LAB — IGG: IgG (Immunoglobin G), Serum: 2290 mg/dL — ABNORMAL HIGH (ref 600–1540)

## 2020-02-29 LAB — AFP TUMOR MARKER: AFP-Tumor Marker: 2.3 ng/mL

## 2020-03-05 ENCOUNTER — Ambulatory Visit (HOSPITAL_COMMUNITY)
Admission: RE | Admit: 2020-03-05 | Discharge: 2020-03-05 | Disposition: A | Discharge status: Home / Self Care | Payer: Medicare Other | Source: Ambulatory Visit | Attending: Gastroenterology | Admitting: Gastroenterology

## 2020-03-05 ENCOUNTER — Other Ambulatory Visit: Payer: Self-pay

## 2020-03-05 DIAGNOSIS — K754 Autoimmune hepatitis: Secondary | ICD-10-CM | POA: Diagnosis not present

## 2020-03-05 DIAGNOSIS — K7469 Other cirrhosis of liver: Secondary | ICD-10-CM | POA: Insufficient documentation

## 2020-03-05 DIAGNOSIS — D709 Neutropenia, unspecified: Secondary | ICD-10-CM | POA: Diagnosis not present

## 2020-03-05 DIAGNOSIS — K862 Cyst of pancreas: Secondary | ICD-10-CM | POA: Insufficient documentation

## 2020-03-05 IMAGING — MR MR ABDOMEN WO/W CM
19 of 20 series · 47 of 48 positions shown · IV contrast (Contrast agent)
Comparison: MRI abdomen [DATE]

CLINICAL DATA: Follow-up to pancreatic lesion seen on prior MRI.
History of cirrhosis.

EXAM:
MRI ABDOMEN WITHOUT AND WITH CONTRAST
TECHNIQUE: Multiplanar multisequence MR imaging of the abdomen was performed
both before and after the administration of intravenous contrast.
CONTRAST:  10mL GADAVIST GADOBUTROL 1 MMOL/ML IV SOLN

[Series 4: cor haste · coronal · 6.0mm · 1.28mm/px · 2 of 40 slices shown]
[im 1/40]
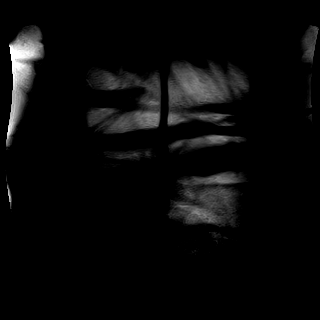
[im 40/40]
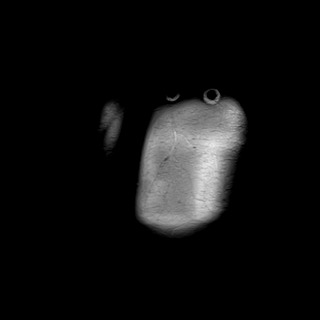

[Series 5: ax haste · axial · 6.0mm · 1.31mm/px · 1 of 40 slices shown]
[im 1/40]
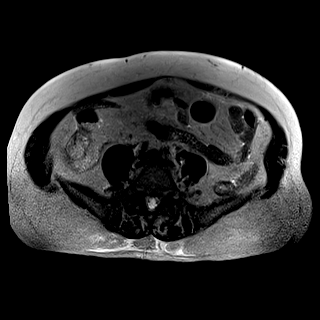

[Series 10: T2 fat-sat · axial · 6.0mm · 1.19mm/px · 1 of 5 slices shown (1 of 2)]
[im 1/5]
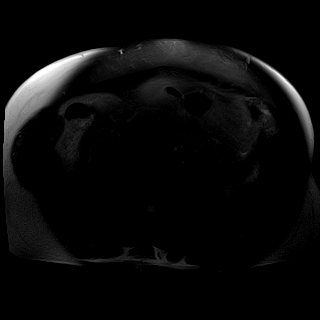

[Series 11: T2 fat-sat · axial · 6.0mm · 1.19mm/px · 1 of 40 slices shown (2 of 2)]
[im 1/40]
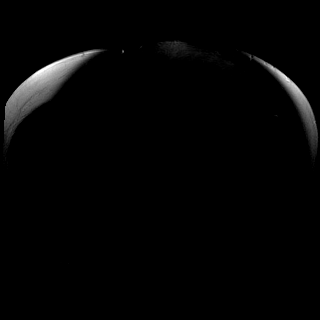

[Series 12: t1_vibe_opp-in_tra_p4_bh · axial · 3.0mm · 1.28mm/px · z∈[-194,+67]mm · 3 of 88 slices shown (1 of 2)]
[im 1/88]
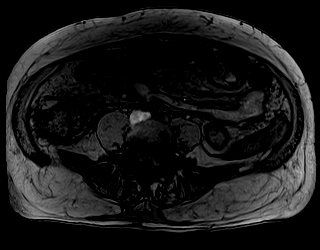
[im 44/88]
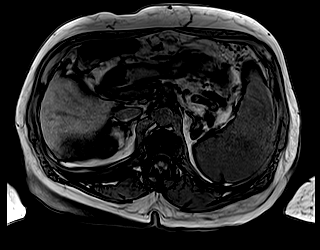
[im 88/88]
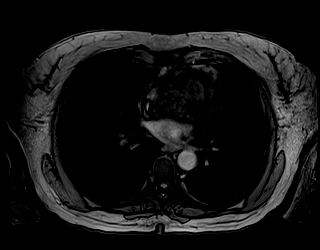

[Series 12: t1_vibe_opp-in_tra_p4_bh · axial · 3.0mm · 1.28mm/px · z∈[-194,+67]mm · 3 of 88 slices shown (2 of 2)]
[im 1/88]
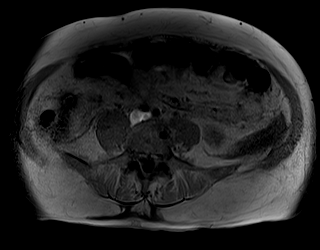
[im 44/88]
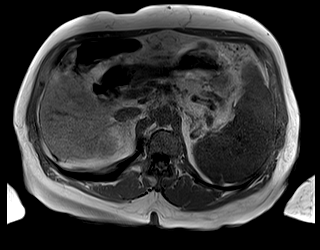
[im 88/88]
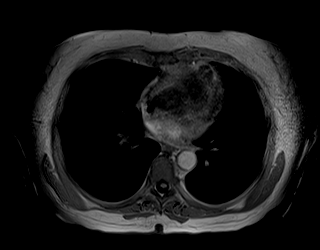

[Series 13: DWI · axial · 6.0mm · 1.53mm/px · z∈[-211,+70]mm · 4 of 120 slices shown (1 of 2)]
[im 1/120]
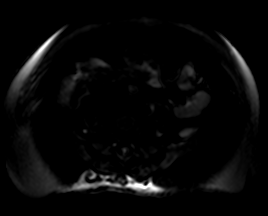
[im 40/120]
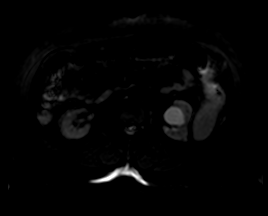
[im 80/120]
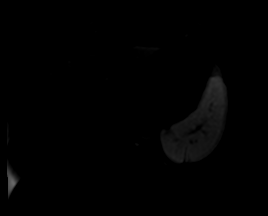
[im 120/120]
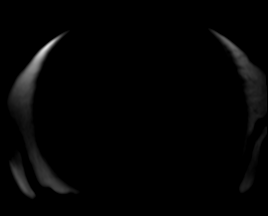

[Series 14: DWI · axial · 6.0mm · 1.53mm/px · 1 of 40 slices shown (2 of 2)]
[im 1/40]
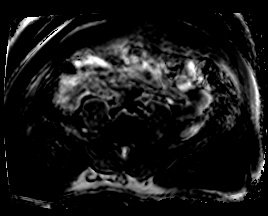

[Series 15: bSSFP · axial · 6.0mm · 0.80mm/px · 1 of 42 slices shown]
[im 1/42]
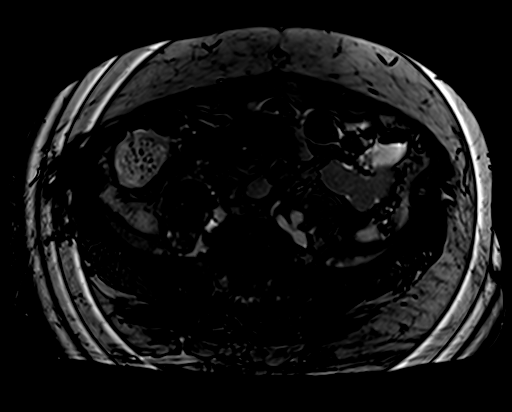

[Series 16: t1_vibe_fs_tra_p4_bh_pre · axial · 3.0mm · 1.28mm/px · z∈[-194,+67]mm · 3 of 88 slices shown]
[im 1/88]
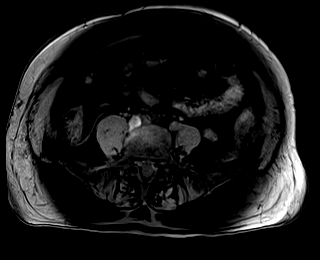
[im 44/88]
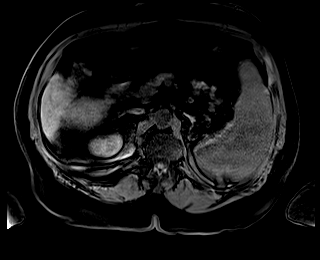
[im 88/88]
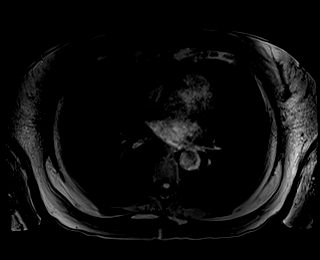

[Series 18: t1_vibe_fs_tra_p4_bh_post · axial · 3.0mm · 1.28mm/px · z∈[-194,+67]mm · 3 of 88 slices shown (1 of 4)]
[im 1/88]
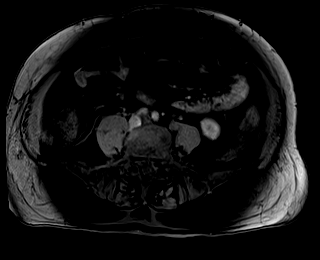
[im 44/88]
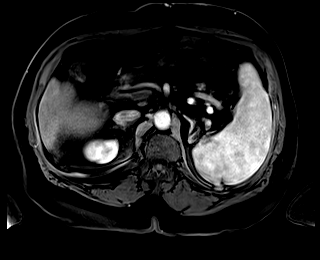
[im 88/88]
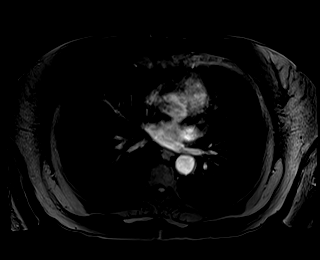

[Series 19: t1_vibe_fs_tra_p4_bh_post_sub · axial · 3.0mm · 1.28mm/px · z∈[-194,+67]mm · 3 of 88 slices shown (1 of 4)]
[im 1/88]
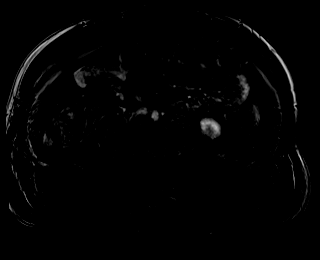
[im 44/88]
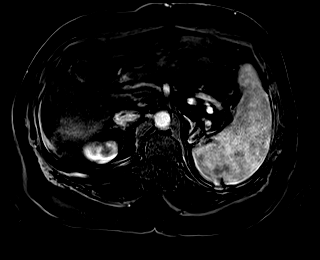
[im 88/88]
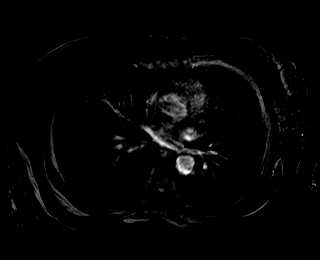

[Series 20: t1_vibe_fs_tra_p4_bh_post · axial · 3.0mm · 1.28mm/px · z∈[-194,+67]mm · 3 of 88 slices shown (2 of 4)]
[im 1/88]
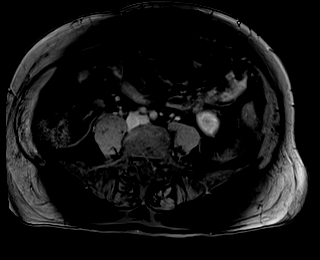
[im 44/88]
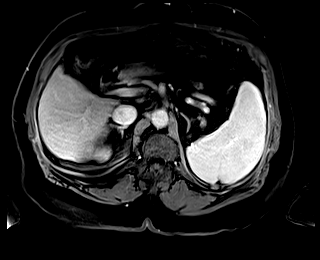
[im 88/88]
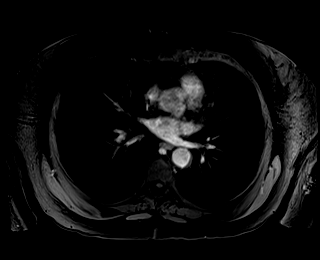

[Series 21: t1_vibe_fs_tra_p4_bh_post_sub · axial · 3.0mm · 1.28mm/px · z∈[-194,+67]mm · 3 of 88 slices shown (2 of 4)]
[im 1/88]
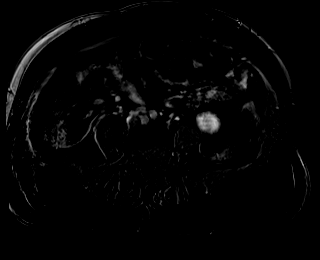
[im 44/88]
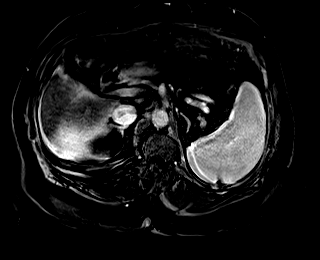
[im 88/88]
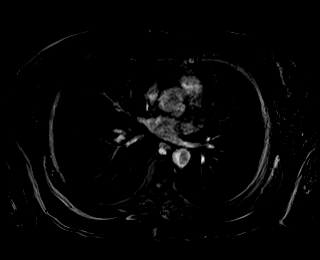

[Series 22: t1_vibe_fs_tra_p4_bh_post · axial · 3.0mm · 1.28mm/px · z∈[-194,+67]mm · 3 of 88 slices shown (3 of 4)]
[im 1/88]
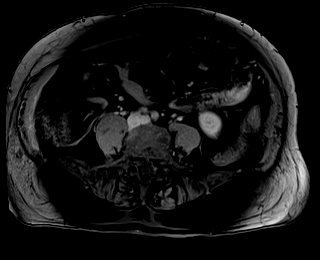
[im 44/88]
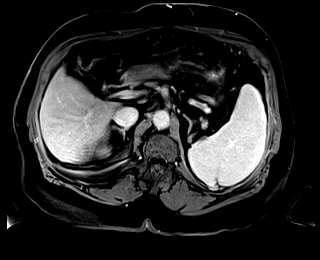
[im 88/88]
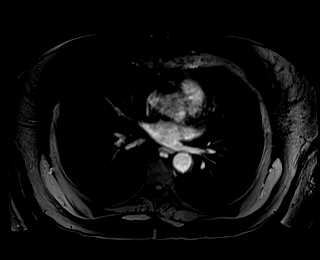

[Series 23: t1_vibe_fs_tra_p4_bh_post_sub · axial · 3.0mm · 1.28mm/px · z∈[-194,+67]mm · 3 of 88 slices shown (3 of 4)]
[im 1/88]
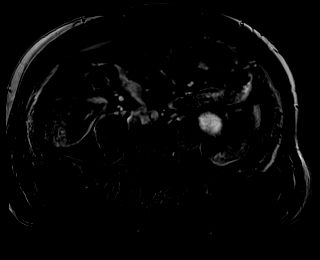
[im 44/88]
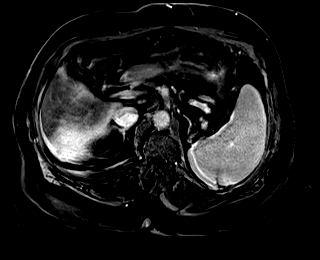
[im 88/88]
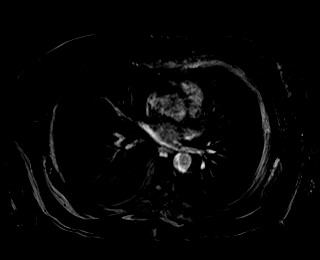

[Series 24: t1_vibe_fs_tra_p4_bh_post · axial · 3.0mm · 1.28mm/px · z∈[-194,+67]mm · 3 of 88 slices shown (4 of 4)]
[im 1/88]
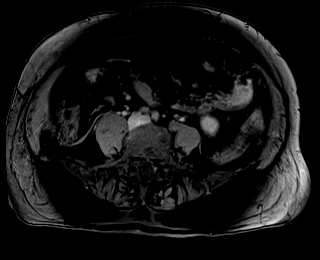
[im 44/88]
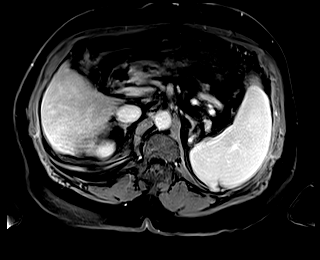
[im 88/88]
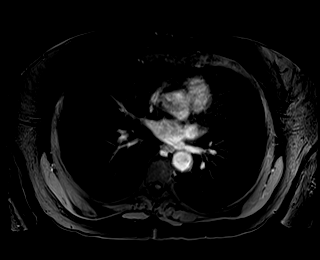

[Series 25: t1_vibe_fs_tra_p4_bh_post_sub · axial · 3.0mm · 1.28mm/px · z∈[-194,+67]mm · 3 of 88 slices shown (4 of 4)]
[im 1/88]
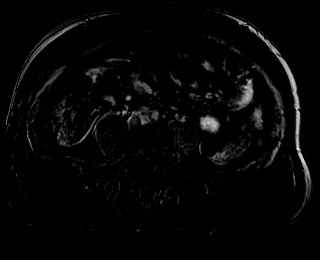
[im 44/88]
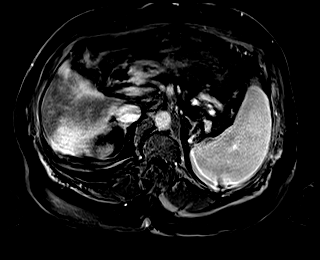
[im 88/88]
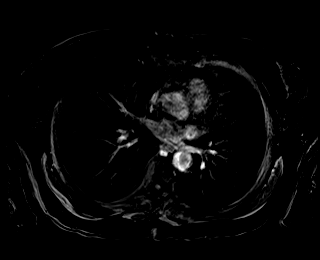

[Series 26: T1 dynamic post-contrast · coronal · 3.0mm · 1.31mm/px · 3 of 80 slices shown]
[im 1/80]
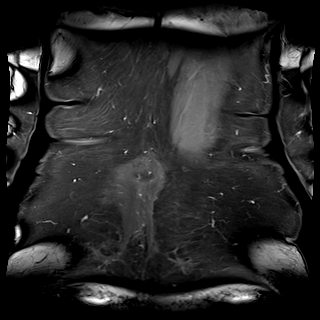
[im 40/80]
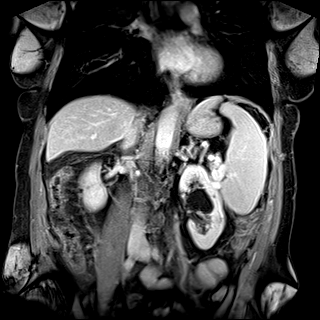
[im 80/80]
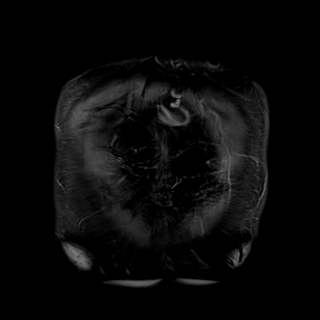

[47 of 48 positions shown; findings below may reference images not displayed]

FINDINGS: Lower chest: No acute abnormality.

Hepatobiliary: Atrophic liver with nodular hepatic contour,
compatible with with known cirrhosis. No hepatic steatosis. No
masslike hepatic lesion. No foci of arterial enhancement.

Cholecystectomy.  Mild reservoir effect of the biliary tree.

Pancreas: Unchanged size of the unilocular 8 mm cyst in the
pancreatic tail (series 4, image 25 and series 5, image 19), without
enhancement, nodularity, wall thickening or thick internal
septations. No additional pancreatic lesions. No pancreatic ductal
dilatation.

Spleen: Similar moderate splenomegaly measuring 16.0 cm previously
16.2 cm in craniocaudal dimension.

Adrenals/Urinary Tract: Normal bilateral adrenal glands. No
hydronephrosis. Simple appearing bilateral renal cysts the largest
of which is in interpolar left parapelvic renal cyst measuring
cm. No enhancing renal masses.

Stomach/Bowel: Visualized portions within the abdomen are
unremarkable.

Vascular/Lymphatic: No pathologically enlarged lymph nodes
identified. No abdominal aortic aneurysm demonstrated. Patent
portal, splenic, hepatic and renal veins. Splenorenal shunt with
perisplenic varices. Gastroesophageal and periumbilical varices.

Other:  No abdominopelvic ascites.

Musculoskeletal: No aggressive appearing osseous lesions.
IMPRESSION: 1. Unchanged size of the unilocular 8 mm cyst in the pancreatic
tail, likely representing a side branch IPMN. Follow-up MRI abdomen
without and with IV contrast recommended in 2 years. This
recommendation follows ACR consensus guidelines: Management of
incidental Pancreatic Cysts: A White Paper of the ACR Incidental
findings Committee. [HOSPITAL] [RQ];[DATE].
2. Cirrhosis with evidence of portal hypertension. No suspicious
hepatic lesion.
3. Simple appearing bilateral renal cysts.

## 2020-03-05 MED ORDER — GADOBUTROL 1 MMOL/ML IV SOLN
10.0000 mL | Freq: Once | INTRAVENOUS | Status: AC | PRN
  Administered 2020-03-05: 10 mL via INTRAVENOUS

## 2020-03-06 ENCOUNTER — Other Ambulatory Visit (HOSPITAL_COMMUNITY): Payer: Medicare Other

## 2020-03-10 ENCOUNTER — Ambulatory Visit: Payer: Medicare Other | Admitting: Family Medicine

## 2020-03-11 ENCOUNTER — Other Ambulatory Visit: Payer: Self-pay

## 2020-03-11 MED ORDER — PANTOPRAZOLE SODIUM 40 MG PO TBEC: 40.0000 mg | DELAYED_RELEASE_TABLET | Freq: Two times a day (BID) | ORAL | 3 refills | Status: DC

## 2020-03-13 ENCOUNTER — Other Ambulatory Visit (INDEPENDENT_AMBULATORY_CARE_PROVIDER_SITE_OTHER): Payer: Medicare Other

## 2020-03-13 DIAGNOSIS — D6959 Other secondary thrombocytopenia: Secondary | ICD-10-CM | POA: Diagnosis not present

## 2020-03-13 DIAGNOSIS — K754 Autoimmune hepatitis: Secondary | ICD-10-CM | POA: Diagnosis not present

## 2020-03-13 DIAGNOSIS — R748 Abnormal levels of other serum enzymes: Secondary | ICD-10-CM | POA: Diagnosis not present

## 2020-03-13 DIAGNOSIS — K7469 Other cirrhosis of liver: Secondary | ICD-10-CM

## 2020-03-13 DIAGNOSIS — D709 Neutropenia, unspecified: Secondary | ICD-10-CM

## 2020-03-13 DIAGNOSIS — I8500 Esophageal varices without bleeding: Secondary | ICD-10-CM | POA: Diagnosis not present

## 2020-03-13 DIAGNOSIS — T50905A Adverse effect of unspecified drugs, medicaments and biological substances, initial encounter: Secondary | ICD-10-CM

## 2020-03-13 DIAGNOSIS — K862 Cyst of pancreas: Secondary | ICD-10-CM

## 2020-03-13 LAB — CBC
HCT: 36.7 % (ref 36.0–46.0)
Hemoglobin: 12.7 g/dL (ref 12.0–15.0)
MCHC: 34.6 g/dL (ref 30.0–36.0)
MCV: 90.9 fl (ref 78.0–100.0)
Platelets: 49 10*3/uL — ABNORMAL LOW (ref 150.0–400.0)
RBC: 4.04 Mil/uL (ref 3.87–5.11)
RDW: 15.2 % (ref 11.5–15.5)
WBC: 1.7 10*3/uL — CL (ref 4.0–10.5)

## 2020-03-13 LAB — HEPATIC FUNCTION PANEL
ALT: 15 U/L (ref 0–35)
AST: 21 U/L (ref 0–37)
Albumin: 3 g/dL — ABNORMAL LOW (ref 3.5–5.2)
Alkaline Phosphatase: 70 U/L (ref 39–117)
Bilirubin, Direct: 0.3 mg/dL (ref 0.0–0.3)
Total Bilirubin: 1.1 mg/dL (ref 0.2–1.2)
Total Protein: 6.5 g/dL (ref 6.0–8.3)

## 2020-03-14 LAB — IGG: IgG (Immunoglobin G), Serum: 2232 mg/dL — ABNORMAL HIGH (ref 600–1540)

## 2020-03-17 ENCOUNTER — Ambulatory Visit: Payer: Medicare Other | Admitting: Family Medicine

## 2020-03-24 ENCOUNTER — Other Ambulatory Visit: Payer: Self-pay

## 2020-03-24 ENCOUNTER — Encounter: Payer: Self-pay | Admitting: Family Medicine

## 2020-03-24 ENCOUNTER — Ambulatory Visit (INDEPENDENT_AMBULATORY_CARE_PROVIDER_SITE_OTHER): Payer: Medicare Other | Admitting: Family Medicine

## 2020-03-24 VITALS — BP 122/68 | HR 65 | Temp 97.0°F | Wt 211.0 lb

## 2020-03-24 DIAGNOSIS — B351 Tinea unguium: Secondary | ICD-10-CM | POA: Diagnosis not present

## 2020-03-24 DIAGNOSIS — C4441 Basal cell carcinoma of skin of scalp and neck: Secondary | ICD-10-CM | POA: Insufficient documentation

## 2020-03-24 DIAGNOSIS — K754 Autoimmune hepatitis: Secondary | ICD-10-CM | POA: Diagnosis not present

## 2020-03-24 DIAGNOSIS — E039 Hypothyroidism, unspecified: Secondary | ICD-10-CM

## 2020-03-24 DIAGNOSIS — K7469 Other cirrhosis of liver: Secondary | ICD-10-CM

## 2020-03-24 DIAGNOSIS — R600 Localized edema: Secondary | ICD-10-CM | POA: Diagnosis not present

## 2020-03-24 LAB — BASIC METABOLIC PANEL
BUN: 14 mg/dL (ref 6–23)
CO2: 22 mEq/L (ref 19–32)
Calcium: 8.3 mg/dL — ABNORMAL LOW (ref 8.4–10.5)
Chloride: 104 mEq/L (ref 96–112)
Creatinine, Ser: 0.95 mg/dL (ref 0.40–1.20)
GFR: 58.51 mL/min — ABNORMAL LOW (ref >60.00)
Glucose, Bld: 86 mg/dL (ref 70–99)
Potassium: 4.4 mEq/L (ref 3.5–5.1)
Sodium: 132 mEq/L — ABNORMAL LOW (ref 135–145)

## 2020-03-24 LAB — TSH: TSH: 3.77 u[IU]/mL (ref 0.35–4.50)

## 2020-03-24 MED ORDER — SPIRONOLACTONE 25 MG PO TABS
12.5000 mg | ORAL_TABLET | Freq: Every day | ORAL | 0 refills | Status: DC
Start: 2020-03-24 — End: 2020-06-18

## 2020-03-24 NOTE — Progress Notes (Signed)
[facility] [address] [place] [address] Dept: [phone] Dept Fax: [phone]  Chronic Care Office Visit  Subjective:    Patient ID: Stacey Velazquez, female    DOB: 1945-11-20, 75 y.o..   MRN: [id number]  Chief Complaint  Patient presents with  . Medication Refill    3 mo follow up, discuss medications, pt states she has an infection in her left big toe she has been seen at a facility for this issue it causes severe pain, swelling and redness on this toe.    History of Present Illness:  Patient is in today for reassessment of his chronic medical issues. Ms. Addis has a history of autoimmune hepatitis with cirrhosis complicated by esophageal varices and edema. She is currently managed on tacrolimus for her hepatitis and carvedilol related to her varices. She is not currently on any diuretics.  Ms. Kaffenberger has a history of hypothyroidism. She is currently on levothyroxine.  Ms. Kiss has a history of left great toenail onychomycosis. She apparently has had some secondary bacterial infections at times. She is prone to this in light of leukopenia due to her autoimmune treatments. She has a great number of medication allergies, side effects, and intolerances that limit options for management. She was seen by [facility] for management. She had some initial nail care, but pushed to see the podiatrist. Apparently, the podiatrist wanted to do a toe nail removal, but due to her reaction to lidocaine previously was looking into general anesthesia options for this. The patient notes that the podiatrist has never contacted her back about scheduling for this and she no longer feels faith in this group. She would prefer to move ahead with referral to another podiatrist to assume care for this.  Past Medical History: Patient Active Problem List   Diagnosis Date Noted  . Basal cell carcinoma of scalp 03/24/2020  . Onychomycosis  03/24/2020  . Pedal edema 03/24/2020  . Esophageal varices determined by endoscopy ([place]) 07/03/2019  . Limited mobility 05/14/2019  . Adenomatous polyp of rectum s/p TEM partial proctectomy 05/09/2019 05/10/2019  . Hepatic cirrhosis ([place]) 04/09/2019  . Obesity, Class II, BMI 35-39.9 04/09/2019  . Hallucinations 03/13/2019  . Rectal bleeding 03/05/2019  . Vertigo   . Heart disease   . GERD (gastroesophageal reflux disease)   . Autoimmune hepatitis ([place])   . Anxiety   . Low back pain 10/18/2016  . Acute right hip pain 08/26/2016  . Seizure disorder ([place]) 10/14/2014  . Drug-induced Parkinson's disease ([place]) 10/14/2014  . Hypothyroidism 09/09/2014  . Azathioprine-induced leukopenia ([place]) 08/25/2014  . Thrombocytopenia due to drugs 08/25/2014  . Syncope   . Sinus arrest   . Pacemaker Medtronic   . Bipolar 1 disorder ([place])   . Paranoid schizophrenia ([place]) 03/25/2012  . Glaucoma 01/25/2001   Past Surgical History:  Procedure Laterality Date  . BIOPSY  03/06/2019   Procedure: BIOPSY;  Surgeon: Thornton Park, MD;  Location: [facility];  Service: Gastroenterology;;  EGD and COLON  . CATARACT EXTRACTION Left 2008   pt unaware  . CHOLECYSTECTOMY    . COLONOSCOPY WITH PROPOFOL N/A 03/06/2019   Procedure: COLONOSCOPY WITH PROPOFOL;  Surgeon: Thornton Park, MD;  Location: [facility];  Service: Gastroenterology;  Laterality: N/A;  . ESOPHAGOGASTRODUODENOSCOPY (EGD) WITH PROPOFOL N/A 03/06/2019   Procedure: ESOPHAGOGASTRODUODENOSCOPY (EGD) WITH PROPOFOL ;  Surgeon: Thornton Park, MD;  Location: [facility];  Service: Gastroenterology;  Laterality: N/A;  . KNEE SURGERY Left  [place]  2014  . PARTIAL HYSTERECTOMY  1979  . PARTIAL PROCTECTOMY BY TEM N/A 05/10/2019   Procedure: TEM PARTIAL PROCTECTOMY OF RECTAL MASS WITH EXCISION RIGHT PERINEAL SKIN MASS;  Surgeon: Michael Boston, MD;  Location: [facility];  Service: General;  Laterality: N/A;  . POLYPECTOMY  03/06/2019    Procedure: POLYPECTOMY;  Surgeon: Thornton Park, MD;  Location: [facility];  Service: Gastroenterology;;  . REFRACTIVE SURGERY    . TUBAL LIGATION     Family History  Problem Relation Age of Onset  . Heart attack Father   . Heart disease Paternal Uncle   . Heart disease Paternal Grandmother   . Diabetes Paternal Grandmother   . Heart disease Paternal Grandfather   . Breast cancer Mother   . Cancer Maternal Grandmother   . CVA Maternal Grandmother   . Cancer Maternal Grandfather   . CVA Maternal Grandfather   . Diabetes Sister   . Diabetes Brother   . Diabetes Brother   . Stroke Daughter    Outpatient Medications Prior to Visit  Medication Sig Dispense Refill  . carvedilol (COREG) 6.25 MG tablet Take 1 tablet (6.25 mg total) by mouth daily. 30 tablet 2  . levothyroxine (SYNTHROID) 75 MCG tablet TAKE 1 TABLET (75 MCG TOTAL) BY MOUTH DAILY BEFORE BREAKFAST. 90 tablet 1  . linaclotide (LINZESS) 290 MCG CAPS capsule 1 cap(s)    . pantoprazole (PROTONIX) 40 MG tablet Take 1 tablet (40 mg total) by mouth 2 (two) times daily. 60 tablet 3  . predniSONE (DELTASONE) 5 MG tablet 1 tab(s)    . tacrolimus (PROGRAF) 1 MG capsule Take 3 capsules (3 mg total) by mouth daily. 90 capsule 3  . tamsulosin (FLOMAX) 0.4 MG CAPS capsule Take 0.4 mg by mouth daily.    Marland Kitchen azaTHIOprine (IMURAN) 50 MG tablet TAKE TWO TABLETS (100 MG TOTAL) BY MOUTH DAILY. 60 tablet 3  . mupirocin ointment (BACTROBAN) 2 % Apply 1 application topically 2 (two) times daily. 30 g 2  . omeprazole (PRILOSEC) 40 MG capsule Take 1 capsule (40 mg total) by mouth 2 (two) times daily before a meal. 60 capsule 3   No facility-administered medications prior to visit.   Allergies  Allergen Reactions  . Neomycin Nausea And Vomiting  . Ambien [Zolpidem Tartrate] Other (See Comments)    Hallucinations   . Amitriptyline Other (See Comments)    Stops heart per pt  . Ampicillin Other (See Comments)    Stops heart per pt   .  Anaprox [Naproxen Sodium] Other (See Comments)    Doesn't work per pt  . Benadryl [Diphenhydramine Hcl (Sleep)] Other (See Comments)    Rash  . Cetirizine & Related Other (See Comments)    Swelling   . Cortizone-10 [Hydrocortisone] Other (See Comments)    seizures   . Darvon [Propoxyphene] Other (See Comments)    Increased heart rate per pt   . Diazepam Other (See Comments)    Stops heart per pt  . Diflunisal Swelling and Other (See Comments)    Stops heart per pt (Dolobid)  . Duloxetine Other (See Comments)    Reaction to Cymbalta - pt doesn't remember what the reaction was  . Flexeril [Cyclobenzaprine] Other (See Comments)    Seizures   . Lactose Intolerance (Gi) Swelling and Other (See Comments)    cramping  . Lidocaine Other (See Comments)    Seizures  . Meperidine And Related Other (See Comments)    Stops heart  rate (reaction to Demerol)  . Metanx [L-Methylfolate-Algae-B12-B6] Other (See Comments)    Pt does not remember reaction  . Metoclopramide Other (See Comments)    Pt does not remember reaction  . Morphine And Related Other (See Comments)    Stops heart per pt   . Nuprin [Ibuprofen] Other (See Comments)    Caused headache  . Oxycodone Other (See Comments)    Hallucinations   . Penicillins Other (See Comments)    Stops heart per pt   . Propoxyphene Other (See Comments)    Slowed heart rate per pt (reaction to Darvocet)  . Ranitidine Other (See Comments)    Pt does not remember reaction  . Vicodin [Hydrocodone-Acetaminophen] Other (See Comments)    Seizures   . Tylenol [Acetaminophen] Rash      Objective:   Today's Vitals   03/24/20 1047  BP: 122/68  Pulse: 65  Temp: (!) 97 F (36.1 C)  TempSrc: Temporal  SpO2: 96%  Weight: 211 lb (95.7 kg)   Body mass index is 31.16 kg/m.   General: Well developed, well nourished. No acute distress. Extremities: There is 2-3+ edema of both lower legs and feet. The left great toe nail is thickened and  eroded.   There is some flushing of the toes, but good cap refill. There is no current sign of infection in the paronychium. Psych: Alert and oriented. Normal mood and affect.  Health Maintenance Due  Topic Date Due  . Hepatitis C Screening  Never done  . PNA vac Low Risk Adult (2 of 2 - PPSV23) 04/19/2018  . COVID-19 Vaccine (3 - Pfizer risk 4-dose series) 09/18/2019     Assessment & Plan:   1. Onychomycosis At the patient's request, I will refer her to a different podiatry practice/group. Due to a history fo seizures with lidocaine exposures, she may require general anesthesia to complete a toe nail removal. Her leukopenia certainly puts her at increased risk for foot infections related to her nail disease.  - Ambulatory referral to Podiatry  2. Hypothyroidism, unspecified type We will check her TSH to assure appropriate dosing of her levothyroxine.  - TSH  3. Other cirrhosis of liver ([place]) 4. Autoimmune hepatitis ([place]) 5. Pedal edema I suspect the edema is secondary to her liver disease. Her albumin level was low on her liver panel earlier this month. She is not on any diuretics and notes she pushes fluids. I will start her on a low dose of spironolactone and have her follow-up in a month to reassess her edema.  - Basic metabolic panel - spironolactone (ALDACTONE) 25 MG tablet; Take 0.5 tablets (12.5 mg total) by mouth daily.  Dispense: 45 tablet; Refill: 0  Haydee Salter, MD

## 2020-03-26 ENCOUNTER — Ambulatory Visit (INDEPENDENT_AMBULATORY_CARE_PROVIDER_SITE_OTHER): Payer: Medicare Other | Admitting: Internal Medicine

## 2020-03-26 ENCOUNTER — Other Ambulatory Visit: Payer: Self-pay

## 2020-03-26 ENCOUNTER — Encounter: Payer: Self-pay | Admitting: Internal Medicine

## 2020-03-26 VITALS — BP 126/82 | HR 61 | Ht 69.0 in

## 2020-03-26 DIAGNOSIS — R55 Syncope and collapse: Secondary | ICD-10-CM

## 2020-03-26 DIAGNOSIS — Z950 Presence of cardiac pacemaker: Secondary | ICD-10-CM

## 2020-03-26 DIAGNOSIS — I455 Other specified heart block: Secondary | ICD-10-CM | POA: Diagnosis not present

## 2020-03-26 NOTE — Progress Notes (Signed)
Patient Care Team: Libby Maw, MD as PCP - General (Family Medicine) [name], MD as PCP - Electrophysiology (Cardiology) [name], MD as Consulting Physician (Cardiology) Michael Boston, MD as Consulting Physician (General Surgery) Thornton Park, MD as Consulting Physician (Gastroenterology)   HPI  Stacey Velazquez is a 75 y.o. female Seen in pacemaker followup. Medtronic was implanted 12/14 for sinus arrest and syncope in [place]   She has no known coronary artery disease.  The patient denies chest pain, shortness of breath, nocturnal dyspnea, orthopnea or peripheral edema.  There have been no palpitations, lightheadedness or syncope activity is limited by arthritis knee pain..    She has 2 daughters.   LV EF was normal 7/16 Myoview scan 12/17 normal ejection fraction apical thinning   Date Cr K  9/18 0.79 4.2   2/22 0.95 4.4   History of autoimmune hepatitis with secondary cirrhosis complicated by esophageal varices and pancytopenia now on Prograf.    Past Medical History:  Diagnosis Date  . Adenomatous polyp of ascending colon   . Anxiety   . Bilateral lower extremity edema    burning sensations  . Bipolar 1 disorder ([place])   . Carpal tunnel syndrome   . Cirrhosis of liver ([place])   . Cutaneous horn   . Drug-induced parkinsonism [facility])    patient unaware  . Esophageal varices ([place])   . Fibromyalgia    patient denies  . Gastric ulcer   . GERD (gastroesophageal reflux disease)   . Gingival abscess 08/26/2016  . Glaucoma 2003   pt unaware  . Heart attack ([place])   . Heart disease   . Hepatitis, autoimmune ([place]) 08/25/2014  . History of iron deficiency anemia   . Hypotension   . Hypothyroidism   . Migraines   . Neutropenia ([place])   . Osteoporosis   . Pacemaker Medtronic    MRI compatible  . Paranoid schizophrenia ([place])   . Polyarthritis   . Polyosteoarthritis   . Rectal mass   . Seizures ([place])    last sz 08/21/14  . Sinus  arrest   . Skin cancer    forehead  . SSS (sick sinus syndrome) ([place])   . Stroke ([place])   . Thrombocytopenia ([place])   . Vertigo    bvvp    Past Surgical History:  Procedure Laterality Date  . BIOPSY  03/06/2019   Procedure: BIOPSY;  Surgeon: Thornton Park, MD;  [facility];  Service: Gastroenterology;;  EGD and COLON  . CATARACT EXTRACTION Left 2008   pt unaware  . CHOLECYSTECTOMY    . COLONOSCOPY WITH PROPOFOL N/A 03/06/2019   Procedure: COLONOSCOPY WITH PROPOFOL;  Surgeon: Thornton Park, MD;  [facility];  Service: Gastroenterology;  Laterality: N/A;  . ESOPHAGOGASTRODUODENOSCOPY (EGD) WITH PROPOFOL N/A 03/06/2019   Procedure: ESOPHAGOGASTRODUODENOSCOPY (EGD) WITH PROPOFOL ;  Surgeon: Thornton Park, MD;  [facility];  Service: Gastroenterology;  Laterality: N/A;  . KNEE SURGERY Left 1985  . PACEMAKER INSERTION  2014  . PARTIAL HYSTERECTOMY  1979  . PARTIAL PROCTECTOMY BY TEM N/A 05/10/2019   Procedure: TEM PARTIAL PROCTECTOMY OF RECTAL MASS WITH EXCISION RIGHT PERINEAL SKIN MASS;  Surgeon: Michael Boston, MD;  [facility];  Service: General;  Laterality: N/A;  . POLYPECTOMY  03/06/2019   Procedure: POLYPECTOMY;  Surgeon: Thornton Park, MD;  [facility];  Service: Gastroenterology;;  . REFRACTIVE SURGERY    . TUBAL LIGATION  Current Outpatient Medications  Medication Sig Dispense Refill  . carvedilol (COREG) 6.25 MG tablet Take 1 tablet (6.25 mg total) by mouth daily. 30 tablet 2  . levothyroxine (SYNTHROID) 75 MCG tablet TAKE 1 TABLET (75 MCG TOTAL) BY MOUTH DAILY BEFORE BREAKFAST. 90 tablet 1  . pantoprazole (PROTONIX) 40 MG tablet Take 1 tablet (40 mg total) by mouth 2 (two) times daily. 60 tablet 3  . spironolactone (ALDACTONE) 25 MG tablet Take 0.5 tablets (12.5 mg total) by mouth daily. 45 tablet 0  . tacrolimus (PROGRAF) 1 MG capsule Take 3 capsules (3 mg total) by mouth daily. 90 capsule 3  . tamsulosin (FLOMAX)  0.4 MG CAPS capsule Take 0.4 mg by mouth daily.     No current facility-administered medications for this visit.    Allergies  Allergen Reactions  . Neomycin Nausea And Vomiting  . Ambien [Zolpidem Tartrate] Other (See Comments)    Hallucinations   . Amitriptyline Other (See Comments)    Stops heart per pt  . Ampicillin Other (See Comments)    Stops heart per pt   . Anaprox [Naproxen Sodium] Other (See Comments)    Doesn't work per pt  . Benadryl [Diphenhydramine Hcl (Sleep)] Other (See Comments)    Rash  . Cetirizine & Related Other (See Comments)    Swelling   . Cortizone-10 [Hydrocortisone] Other (See Comments)    seizures   . Darvon [Propoxyphene] Other (See Comments)    Increased heart rate per pt   . Diazepam Other (See Comments)    Stops heart per pt  . Diflunisal Swelling and Other (See Comments)    Stops heart per pt (Dolobid)  . Duloxetine Other (See Comments)    Reaction to Cymbalta - pt doesn't remember what the reaction was  . Flexeril [Cyclobenzaprine] Other (See Comments)    Seizures   . Lactose Intolerance (Gi) Swelling and Other (See Comments)    cramping  . Lidocaine Other (See Comments)    Seizures  . Meperidine And Related Other (See Comments)    Stops heart rate (reaction to Demerol)  . Metanx [L-Methylfolate-Algae-B12-B6] Other (See Comments)    Pt does not remember reaction  . Metoclopramide Other (See Comments)    Pt does not remember reaction  . Morphine And Related Other (See Comments)    Stops heart per pt   . Nuprin [Ibuprofen] Other (See Comments)    Caused headache  . Oxycodone Other (See Comments)    Hallucinations   . Penicillins Other (See Comments)    Stops heart per pt   . Propoxyphene Other (See Comments)    Slowed heart rate per pt (reaction to Darvocet)  . Ranitidine Other (See Comments)    Pt does not remember reaction  . Vicodin [Hydrocodone-Acetaminophen] Other (See Comments)    Seizures   . Tylenol  [Acetaminophen] Rash    Review of Systems negative except from HPI and PMH  Physical Exam BP 126/82   Pulse 61   Ht 5\' 9"  (1.753 m)   SpO2 98%   BMI 31.16 kg/m  Well developed and well nourished in no acute distress HENT normal Neck supple with JVP-flat Clear Device pocket well healed; without hematoma or erythema.  There is no tethering  Regular rate and rhythm, no murmur Abd-soft with active BS No Clubbing cyanosis   edema Skin-warm and dry A & Oriented  Grossly normal sensory and motor function  ECG sinus 61 20/09/41    Personally reviewed unchanged from 2016  Assessment and  Plan  Sinus node dysfunction  Pacemaker-Medtronic     Chest pain typical and atypical features -  Low voltage ECG  HFpEF  Thrombocytopenia  Normal device function.  Progressive bradycardia resulting in increased ventricular pacing..  Carvedilol added by GI.  Likely responsible for the modest increase in atrial pacing.  Heart rate excursion remains adequate.

## 2020-03-26 NOTE — Patient Instructions (Signed)

## 2020-03-27 ENCOUNTER — Other Ambulatory Visit (INDEPENDENT_AMBULATORY_CARE_PROVIDER_SITE_OTHER): Payer: Medicare Other

## 2020-03-27 DIAGNOSIS — D6959 Other secondary thrombocytopenia: Secondary | ICD-10-CM | POA: Diagnosis not present

## 2020-03-27 DIAGNOSIS — D709 Neutropenia, unspecified: Secondary | ICD-10-CM

## 2020-03-27 DIAGNOSIS — K754 Autoimmune hepatitis: Secondary | ICD-10-CM | POA: Diagnosis not present

## 2020-03-27 DIAGNOSIS — K7469 Other cirrhosis of liver: Secondary | ICD-10-CM | POA: Diagnosis not present

## 2020-03-27 DIAGNOSIS — K862 Cyst of pancreas: Secondary | ICD-10-CM | POA: Diagnosis not present

## 2020-03-27 DIAGNOSIS — I8500 Esophageal varices without bleeding: Secondary | ICD-10-CM

## 2020-03-27 DIAGNOSIS — K259 Gastric ulcer, unspecified as acute or chronic, without hemorrhage or perforation: Secondary | ICD-10-CM

## 2020-03-27 DIAGNOSIS — R1032 Left lower quadrant pain: Secondary | ICD-10-CM

## 2020-03-27 DIAGNOSIS — T50905A Adverse effect of unspecified drugs, medicaments and biological substances, initial encounter: Secondary | ICD-10-CM | POA: Diagnosis not present

## 2020-03-27 DIAGNOSIS — R748 Abnormal levels of other serum enzymes: Secondary | ICD-10-CM

## 2020-03-27 LAB — HEPATIC FUNCTION PANEL
ALT: 15 U/L (ref 0–35)
AST: 19 U/L (ref 0–37)
Albumin: 2.9 g/dL — ABNORMAL LOW (ref 3.5–5.2)
Alkaline Phosphatase: 63 U/L (ref 39–117)
Bilirubin, Direct: 0.3 mg/dL (ref 0.0–0.3)
Total Bilirubin: 1.2 mg/dL (ref 0.2–1.2)
Total Protein: 6.2 g/dL (ref 6.0–8.3)

## 2020-03-27 LAB — COMPREHENSIVE METABOLIC PANEL
ALT: 15 U/L (ref 0–35)
AST: 19 U/L (ref 0–37)
Albumin: 2.9 g/dL — ABNORMAL LOW (ref 3.5–5.2)
Alkaline Phosphatase: 63 U/L (ref 39–117)
BUN: 13 mg/dL (ref 6–23)
CO2: 23 mEq/L (ref 19–32)
Calcium: 8.4 mg/dL (ref 8.4–10.5)
Chloride: 105 mEq/L (ref 96–112)
Creatinine, Ser: 1.06 mg/dL (ref 0.40–1.20)
GFR: 51.3 mL/min — ABNORMAL LOW (ref >60.00)
Glucose, Bld: 92 mg/dL (ref 70–99)
Potassium: 4.1 mEq/L (ref 3.5–5.1)
Sodium: 131 mEq/L — ABNORMAL LOW (ref 135–145)
Total Bilirubin: 1.2 mg/dL (ref 0.2–1.2)
Total Protein: 6.2 g/dL (ref 6.0–8.3)

## 2020-03-27 LAB — CBC
HCT: 34.2 % — ABNORMAL LOW (ref 36.0–46.0)
Hemoglobin: 12 g/dL (ref 12.0–15.0)
MCHC: 34.9 g/dL (ref 30.0–36.0)
MCV: 90.2 fl (ref 78.0–100.0)
Platelets: 51 10*3/uL — ABNORMAL LOW (ref 150.0–400.0)
RBC: 3.79 Mil/uL — ABNORMAL LOW (ref 3.87–5.11)
RDW: 15.1 % (ref 11.5–15.5)
WBC: 1.7 10*3/uL — CL (ref 4.0–10.5)

## 2020-03-27 LAB — PROTIME-INR
INR: 1.4 ratio — ABNORMAL HIGH (ref 0.8–1.0)
Prothrombin Time: 15.9 s — ABNORMAL HIGH (ref 9.6–13.1)

## 2020-03-28 ENCOUNTER — Emergency Department (HOSPITAL_COMMUNITY)
Admission: EM | Admit: 2020-03-28 | Discharge: 2020-03-28 | Disposition: A | Discharge status: Home / Self Care | Payer: Medicare Other | Attending: Emergency Medicine | Admitting: Emergency Medicine

## 2020-03-28 ENCOUNTER — Encounter (HOSPITAL_COMMUNITY): Payer: Self-pay

## 2020-03-28 ENCOUNTER — Telehealth: Payer: Self-pay | Admitting: Gastroenterology

## 2020-03-28 DIAGNOSIS — R6884 Jaw pain: Secondary | ICD-10-CM | POA: Diagnosis present

## 2020-03-28 DIAGNOSIS — Z87891 Personal history of nicotine dependence: Secondary | ICD-10-CM | POA: Diagnosis not present

## 2020-03-28 DIAGNOSIS — G20 Parkinson's disease: Secondary | ICD-10-CM | POA: Diagnosis not present

## 2020-03-28 DIAGNOSIS — E039 Hypothyroidism, unspecified: Secondary | ICD-10-CM | POA: Diagnosis not present

## 2020-03-28 DIAGNOSIS — I10 Essential (primary) hypertension: Secondary | ICD-10-CM | POA: Diagnosis not present

## 2020-03-28 DIAGNOSIS — Z79899 Other long term (current) drug therapy: Secondary | ICD-10-CM | POA: Insufficient documentation

## 2020-03-28 DIAGNOSIS — Z950 Presence of cardiac pacemaker: Secondary | ICD-10-CM | POA: Insufficient documentation

## 2020-03-28 DIAGNOSIS — K068 Other specified disorders of gingiva and edentulous alveolar ridge: Secondary | ICD-10-CM | POA: Insufficient documentation

## 2020-03-28 DIAGNOSIS — Z8673 Personal history of transient ischemic attack (TIA), and cerebral infarction without residual deficits: Secondary | ICD-10-CM | POA: Diagnosis not present

## 2020-03-28 DIAGNOSIS — Z85828 Personal history of other malignant neoplasm of skin: Secondary | ICD-10-CM | POA: Diagnosis not present

## 2020-03-28 LAB — AFP TUMOR MARKER: AFP-Tumor Marker: 2 ng/mL

## 2020-03-28 LAB — IGG: IgG (Immunoglobin G), Serum: 2041 mg/dL — ABNORMAL HIGH (ref 600–1540)

## 2020-03-28 MED ORDER — BENZOCAINE 10 % MT GEL: 1.0000 "application " | OROMUCOSAL | 0 refills | Status: DC | PRN

## 2020-03-28 MED ORDER — LIDOCAINE VISCOUS HCL 2 % MT SOLN
15.0000 mL | Freq: Once | OROMUCOSAL | Status: DC
  Filled 2020-03-28: qty 15

## 2020-03-28 MED ORDER — BENZOCAINE 10 % MT GEL
Freq: Once | OROMUCOSAL | Status: AC
  Administered 2020-03-28: 1 via OROMUCOSAL
  Filled 2020-03-28: qty 9.4

## 2020-03-28 NOTE — Telephone Encounter (Signed)
Pt is not sure if she is having a reaction to pantoprazole, she stated that she has been swalling of her mouth, throat all the way throught her jaw, ears to her neck, she can even feel some lumps and they are tender to the touch. Reaction began yesterday. Pt stated that she is not sure if it is the pantoprazole that is causing it because she has been taking 3 medications. Pls call her.

## 2020-03-28 NOTE — ED Provider Notes (Signed)
[facility] DEPT Provider Note   CSN: [id number] Arrival date & time: 03/28/20  1351     History Chief Complaint  Patient presents with  . Joint Pain    Stacey Velazquez is a 75 y.o. female.  The history is provided by the patient. No language interpreter was used.  Oral Pain     75 year old female significant history of bipolar, pacemaker, seizure disorder, anxiety, fibromyalgia presenting complaint of mouth pain. Patient report for the past 3 to 4 days she has had pain primarily involving the left upper gum line as well as pain along her left jaw. Pain is achy sharp throbbing moderate in intensity worse when she chew with talking or with movement. Pain is moderate in severity. No associated fever chills no nausea vomiting no chest pain or shortness of breath. She report she does have history of esophageal ulcers or gastric ulcers that she is currently taking PPI for. She also mention recently started on antibiotic for suspected infection of her legs approximate 4 days ago. She believes the antibiotic may have caused her current symptoms. She denies any recent injury. She endorsed chronic joint pain related to her underlying fibromyalgia and polyarthritis. This is not new. No complaints of fever or chills.  Past Medical History:  Diagnosis Date  . Adenomatous polyp of ascending colon   . Anxiety   . Bilateral lower extremity edema    burning sensations  . Bipolar 1 disorder ([place])   . Carpal tunnel syndrome   . Cirrhosis of liver ([place])   . Cutaneous horn   . Drug-induced parkinsonism [facility])    patient unaware  . Esophageal varices ([place])   . Fibromyalgia    patient denies  . Gastric ulcer   . GERD (gastroesophageal reflux disease)   . Gingival abscess 08/26/2016  . Glaucoma 2003   pt unaware  . Heart attack ([place])   . Heart disease   . Hepatitis, autoimmune ([place]) 08/25/2014  . History of iron deficiency anemia   . Hypotension   . Hypothyroidism    . Migraines   . Neutropenia ([place])   . Osteoporosis   . Pacemaker Medtronic    MRI compatible  . Paranoid schizophrenia ([place])   . Polyarthritis   . Polyosteoarthritis   . Rectal mass   . Seizures ([place])    last sz 08/21/14  . Sinus arrest   . Skin cancer    forehead  . SSS (sick sinus syndrome) ([place])   . Stroke ([place])   . Thrombocytopenia ([place])   . Vertigo    bvvp    Patient Active Problem List   Diagnosis Date Noted  . Basal cell carcinoma of scalp 03/24/2020  . Onychomycosis 03/24/2020  . Pedal edema 03/24/2020  . Esophageal varices determined by endoscopy ([place]) 07/03/2019  . Limited mobility 05/14/2019  . Adenomatous polyp of rectum s/p TEM partial proctectomy 05/09/2019 05/10/2019  . Hepatic cirrhosis ([place]) 04/09/2019  . Obesity, Class II, BMI 35-39.9 04/09/2019  . Hallucinations 03/13/2019  . Rectal bleeding 03/05/2019  . Vertigo   . Heart disease   . GERD (gastroesophageal reflux disease)   . Autoimmune hepatitis ([place])   . Anxiety   . Low back pain 10/18/2016  . Acute right hip pain 08/26/2016  . Seizure disorder ([place]) 10/14/2014  . Drug-induced Parkinson's disease ([place]) 10/14/2014  . Hypothyroidism 09/09/2014  . Azathioprine-induced leukopenia ([place]) 08/25/2014  . Thrombocytopenia due to drugs 08/25/2014  . Syncope   . Sinus arrest   .  Pacemaker Medtronic   . Bipolar 1 disorder ([employer])   . Paranoid schizophrenia ([place]) 03/25/2012  . Glaucoma 01/25/2001    Past Surgical History:  Procedure Laterality Date  . BIOPSY  03/06/2019   Procedure: BIOPSY;  Surgeon: Thornton Park, MD;  Location: [facility] ENDOSCOPY;  Service: Gastroenterology;;  EGD and COLON  . CATARACT EXTRACTION Left 2008   pt unaware  . CHOLECYSTECTOMY    . COLONOSCOPY WITH PROPOFOL N/A 03/06/2019   Procedure: COLONOSCOPY WITH PROPOFOL;  Surgeon: Thornton Park, MD;  Location: [facility];  Service: Gastroenterology;  Laterality: N/A;  . ESOPHAGOGASTRODUODENOSCOPY (EGD) WITH PROPOFOL N/A 03/06/2019    Procedure: ESOPHAGOGASTRODUODENOSCOPY (EGD) WITH PROPOFOL ;  Surgeon: Thornton Park, MD;  Location: [facility] ENDOS[facility];  Service: Gastroenterology;  Laterality: N/A;  . KNEE SURGERY Left 1985  . PACEMAKER INSERTION  2014  . PARTIAL HYSTERECTOMY  1979  . PARTIAL PROCTECTOMY BY TEM N/A 05/10/2019   Procedure: TEM PARTIAL PROCTECTOMY OF RECTAL MASS WITH EXCISION RIGHT PERINEAL SKIN MASS;  Surgeon: Michael Boston, MD;  Location: [facility] ORS;  Service: General;  Laterality: N/A;  . POLYPECTOMY  03/06/2019   Procedure: POLYPECTOMY;  Surgeon: Thornton Park, MD;  Location: [facility] ENDOS[facility];  Service: Gastroenterology;;  . REFRACTIVE SURGERY    . TUBAL LIGATION       OB History   No obstetric history on file.     Family History  Problem Relation Age of Onset  . Heart attack Father   . Heart disease Paternal Uncle   . Heart disease Paternal Grandmother   . Diabetes Paternal Grandmother   . Heart disease Paternal Grandfather   . Breast cancer Mother   . Cancer Maternal Grandmother   . CVA Maternal Grandmother   . Cancer Maternal Grandfather   . CVA Maternal Grandfather   . Diabetes Sister   . Diabetes Brother   . Diabetes Brother   . Stroke Daughter     Social History   Tobacco Use  . Smoking status: Former Smoker    Quit date: 03/02/2003    Years since quitting: 17.0  . Smokeless tobacco: Never Used  Vaping Use  . Vaping Use: Never used  Substance Use Topics  . Alcohol use: No  . Drug use: No    Home Medications Prior to Admission medications   Medication Sig Start Date End Date Taking? Authorizing Provider  carvedilol (COREG) 6.25 MG tablet Take 1 tablet (6.25 mg total) by mouth daily. 02/14/20   Thornton Park, MD  levothyroxine (SYNTHROID) 75 MCG tablet TAKE 1 TABLET (75 MCG TOTAL) BY MOUTH DAILY BEFORE BREAKFAST. 01/06/20   Kennyth Arnold, FNP  pantoprazole (PROTONIX) 40 MG tablet Take 1 tablet (40 mg total) by mouth 2 (two) times daily. 03/11/20   Thornton Park, MD   spironolactone (ALDACTONE) 25 MG tablet Take 0.5 tablets (12.5 mg total) by mouth daily. 03/24/20   Haydee Salter, MD  tacrolimus (PROGRAF) 1 MG capsule Take 3 capsules (3 mg total) by mouth daily. 02/14/20   Thornton Park, MD  tamsulosin (FLOMAX) 0.4 MG CAPS capsule Take 0.4 mg by mouth daily. 07/16/19   [provider]    Allergies    Neomycin, Ambien [zolpidem tartrate], Amitriptyline, Ampicillin, Anaprox [naproxen sodium], Benadryl [diphenhydramine hcl (sleep)], Cetirizine & related, Cortizone-10 [hydrocortisone], Darvon [propoxyphene], Diazepam, Diflunisal, Duloxetine, Flexeril [cyclobenzaprine], Lactose intolerance (gi), Lidocaine, Meperidine and related, Metanx [l-methylfolate-algae-b12-b6], Metoclopramide, Morphine and related, Nuprin [ibuprofen], Oxycodone, Penicillins, Propoxyphene, Ranitidine, Vicodin [hydrocodone-acetaminophen], and Tylenol [acetaminophen]  Review of Systems   Review of  Systems  All other systems reviewed and are negative.   Physical Exam Updated Vital Signs BP 128/82 (BP Location: Left Arm)   Pulse 64   Temp 98.2 F (36.8 C) (Oral)   Resp 18   SpO2 100%   Physical Exam Vitals and nursing note reviewed.  Constitutional:      General: She is not in acute distress.    Appearance: She is well-developed and well-nourished. She is obese.  HENT:     Head: Atraumatic.     Mouth/Throat:     Comments: Patient is edentulous. Her left upper gumline there is a 2 x 1 cm ulceration noted with surrounding erythema tender to palpation. She does have some reactive lymph nodes to her anterior cervical chain on palpation. No trismus, no malocclusion and no buccal mucosa ulceration. Eyes:     Conjunctiva/sclera: Conjunctivae normal.  Musculoskeletal:     Cervical back: Neck supple.  Skin:    Findings: No rash.  Neurological:     Mental Status: She is alert.  Psychiatric:        Mood and Affect: Mood and affect normal.     ED Results / Procedures /  Treatments   Labs (all labs ordered are listed, but only abnormal results are displayed) Labs Reviewed - No data to display  EKG None  Radiology No results found.  Procedures Procedures   Medications Ordered in ED Medications  lidocaine (XYLOCAINE) 2 % viscous mouth solution 15 mL (15 mLs Mouth/Throat Not Given 03/28/20 1440)  benzocaine (ORAJEL) 10 % mucosal gel (1 application Mouth/Throat Given 03/28/20 1513)    ED Course  I have reviewed the triage vital signs and the nursing notes.  Pertinent labs & imaging results that were available during my care of the patient were reviewed by me and considered in my medical decision making (see chart for details).    MDM Rules/Calculators/A&P                          BP 121/74 (BP Location: Right Arm)   Pulse 67   Temp 98.2 F (36.8 C) (Oral)   Resp 18   SpO2 98%   Final Clinical Impression(s) / ED Diagnoses Final diagnoses:  Ulcer of gingiva    Rx / DC Orders ED Discharge Orders         Ordered    benzocaine (ORAJEL) 10 % mucosal gel  As needed        03/28/20 1556         2:35 PM Patient complaining of pain to her left upper gum for the past several days. On exam she does have ulceration about the gum that is tender to palpation concerning for gingivitis. She does have some reactive lymph nodes anterior cervical chain. She would benefit from some control such as viscous lidocaine mouth rinse. We will also prescribe Keflex for infection. She mention she was recently placed on some kind of antibiotic for bilateral leg swelling that she developed 4 days ago. Examination of her bilateral lower extremities with signs of trace edema but no obvious cellulitis appreciated. Doubt DVT or compartment syndrome.  2:49 PM Unfortunately, patient is allergic to multiple medications.  She is allergic to viscous lidocaine as well as Magic mouthwash.  We are limited in the type of treatment we can provide for her gingival ulcer.  We will  provide orajel for symptomatic control.  Care discussed with Dr. Almyra Free.    Domenic Moras,  PA-C 03/28/20 1558    Luna Fuse, MD 03/29/20 [phone]

## 2020-03-28 NOTE — Telephone Encounter (Signed)
Attempted to call pt again and received message stating to enter remote access device. Unable to reach pt or leave message.  Pt was seen in the ER for a gingival ulcer.

## 2020-03-28 NOTE — ED Triage Notes (Signed)
Pt presents with c/o joint pain. Pt has a hx of arthritis but reports that the last few days, the pain has been worse. Pt is generalized but worse in her jaw which has caused increased difficulty eating.

## 2020-03-28 NOTE — Telephone Encounter (Signed)
Attempted to call pt and phone rang multiple times but pt never answered. Will try again.

## 2020-03-28 NOTE — Discharge Instructions (Signed)
Please apply orajel to affected area 4 times daily as needed for comfort.  Follow up closely with your doctor for further care.

## 2020-03-28 NOTE — ED Notes (Signed)
[facility] called for transport back to apt.

## 2020-03-31 ENCOUNTER — Telehealth: Payer: Self-pay | Admitting: Gastroenterology

## 2020-03-31 NOTE — Telephone Encounter (Signed)
Attempted to call pt back and phone line is busy. Will try again.

## 2020-03-31 NOTE — Telephone Encounter (Signed)
Please review and advise. Thanks. Dm/cma  

## 2020-03-31 NOTE — Telephone Encounter (Signed)
Spoke with pt and she states she does not need anything from our office. Discussed with pt that per note from PCP office they thinks she needs an eval and may be trying to contact her. She is aware.

## 2020-03-31 NOTE — Telephone Encounter (Signed)
Pt called and said she had talked to a triage nurse last week and she told her what was going on and they had her call EMS to come and get her right away, pt did do that and went to the hospital, she was discharged and called back this morning and said that it is getting worse and that she even lost eyesight for a short amount of time this morning. I called and transferred pt to nurse triage and told pt I would make Dr Gena Fray aware of what was going on

## 2020-03-31 NOTE — Telephone Encounter (Signed)
Tried to call x 2 and it rang busy.  Will try later.Dm/cma

## 2020-03-31 NOTE — Telephone Encounter (Signed)
Tried calling again and the phone just rang, no VM set up.  Will try later. Dm/cma

## 2020-03-31 NOTE — Telephone Encounter (Signed)
Attempted to call pt again and now there is no answer.

## 2020-04-01 NOTE — Telephone Encounter (Signed)
Tried calling and phone rang busy. Will try later. Dm/cma

## 2020-04-07 ENCOUNTER — Encounter: Payer: Self-pay | Admitting: Family Medicine

## 2020-04-07 NOTE — Telephone Encounter (Signed)
Tried calling again and the phone just rang, no VM set up.  letter mailed to home address. Dm/cma

## 2020-04-08 NOTE — Progress Notes (Signed)
Attempted to obtain medical history via telephone, unable to reach at this time.Telephone number busy

## 2020-04-09 ENCOUNTER — Other Ambulatory Visit: Payer: Self-pay | Admitting: Gastroenterology

## 2020-04-09 DIAGNOSIS — K862 Cyst of pancreas: Secondary | ICD-10-CM

## 2020-04-09 DIAGNOSIS — K754 Autoimmune hepatitis: Secondary | ICD-10-CM

## 2020-04-09 DIAGNOSIS — D709 Neutropenia, unspecified: Secondary | ICD-10-CM

## 2020-04-09 DIAGNOSIS — K7469 Other cirrhosis of liver: Secondary | ICD-10-CM

## 2020-04-10 ENCOUNTER — Other Ambulatory Visit (INDEPENDENT_AMBULATORY_CARE_PROVIDER_SITE_OTHER): Payer: Medicare Other

## 2020-04-10 DIAGNOSIS — K754 Autoimmune hepatitis: Secondary | ICD-10-CM

## 2020-04-10 DIAGNOSIS — K862 Cyst of pancreas: Secondary | ICD-10-CM

## 2020-04-10 DIAGNOSIS — T50905A Adverse effect of unspecified drugs, medicaments and biological substances, initial encounter: Secondary | ICD-10-CM | POA: Diagnosis not present

## 2020-04-10 DIAGNOSIS — R748 Abnormal levels of other serum enzymes: Secondary | ICD-10-CM | POA: Diagnosis not present

## 2020-04-10 DIAGNOSIS — D6959 Other secondary thrombocytopenia: Secondary | ICD-10-CM

## 2020-04-10 DIAGNOSIS — D709 Neutropenia, unspecified: Secondary | ICD-10-CM

## 2020-04-10 DIAGNOSIS — K7469 Other cirrhosis of liver: Secondary | ICD-10-CM

## 2020-04-10 DIAGNOSIS — I8500 Esophageal varices without bleeding: Secondary | ICD-10-CM | POA: Diagnosis not present

## 2020-04-10 LAB — HEPATIC FUNCTION PANEL
ALT: 17 U/L (ref 0–35)
AST: 24 U/L (ref 0–37)
Albumin: 2.9 g/dL — ABNORMAL LOW (ref 3.5–5.2)
Alkaline Phosphatase: 74 U/L (ref 39–117)
Bilirubin, Direct: 0.2 mg/dL (ref 0.0–0.3)
Total Bilirubin: 0.9 mg/dL (ref 0.2–1.2)
Total Protein: 6.4 g/dL (ref 6.0–8.3)

## 2020-04-10 LAB — CBC
HCT: 35.4 % — ABNORMAL LOW (ref 36.0–46.0)
Hemoglobin: 12.4 g/dL (ref 12.0–15.0)
MCHC: 34.9 g/dL (ref 30.0–36.0)
MCV: 90.7 fl (ref 78.0–100.0)
Platelets: 44 10*3/uL — CL (ref 150.0–400.0)
RBC: 3.91 Mil/uL (ref 3.87–5.11)
RDW: 14.7 % (ref 11.5–15.5)
WBC: 2.4 10*3/uL — ABNORMAL LOW (ref 4.0–10.5)

## 2020-04-11 ENCOUNTER — Other Ambulatory Visit (HOSPITAL_COMMUNITY)
Admission: RE | Admit: 2020-04-11 | Discharge: 2020-04-11 | Disposition: A | Discharge status: Home / Self Care | Payer: Medicare Other | Source: Ambulatory Visit | Attending: Gastroenterology | Admitting: Gastroenterology

## 2020-04-11 DIAGNOSIS — Z01812 Encounter for preprocedural laboratory examination: Secondary | ICD-10-CM | POA: Insufficient documentation

## 2020-04-11 DIAGNOSIS — Z20822 Contact with and (suspected) exposure to covid-19: Secondary | ICD-10-CM | POA: Diagnosis not present

## 2020-04-11 LAB — IGG: IgG (Immunoglobin G), Serum: 2110 mg/dL — ABNORMAL HIGH (ref 600–1540)

## 2020-04-11 LAB — SARS CORONAVIRUS 2 (TAT 6-24 HRS): SARS Coronavirus 2: NEGATIVE

## 2020-04-14 NOTE — Anesthesia Preprocedure Evaluation (Addendum)
Anesthesia Evaluation  Patient identified by MRN, date of birth, ID band Patient awake    Reviewed: Allergy & Precautions, NPO status , Patient's Chart, lab work & pertinent test results  History of Anesthesia Complications Negative for: history of anesthetic complications  Airway Mallampati: II  TM Distance: >3 FB Neck ROM: Full    Dental  (+) Edentulous Upper, Missing,    Pulmonary former smoker,    Pulmonary exam normal        Cardiovascular + Past MI  Normal cardiovascular exam+ pacemaker ([facility])   TTE 2018: EF 50-55%   Neuro/Psych  Headaches, Seizures -,  Anxiety Bipolar Disorder Schizophrenia CVA    GI/Hepatic PUD, GERD  Medicated and Controlled,(+) Hepatitis -, AutoimmunePlts 44k   Endo/Other  Hypothyroidism   Renal/GU negative Renal ROS  negative genitourinary   Musculoskeletal  (+) Arthritis , Fibromyalgia -  Abdominal   Peds  Hematology negative hematology ROS (+)   Anesthesia Other Findings Day of surgery medications reviewed with patient.  Reproductive/Obstetrics negative OB ROS                            Anesthesia Physical Anesthesia Plan  ASA: III  Anesthesia Plan: MAC   Post-op Pain Management:    Induction:   PONV Risk Score and Plan: Treatment may vary due to age or medical condition and Propofol infusion  Airway Management Planned: Natural Airway and Nasal Cannula  Additional Equipment:   Intra-op Plan:   Post-operative Plan:   Informed Consent: I have reviewed the patients History and Physical, chart, labs and discussed the procedure including the risks, benefits and alternatives for the proposed anesthesia with the patient or authorized representative who has indicated his/her understanding and acceptance.       Plan Discussed with: CRNA  Anesthesia Plan Comments:        Anesthesia Quick Evaluation

## 2020-04-15 ENCOUNTER — Ambulatory Visit (HOSPITAL_COMMUNITY)
Admission: RE | Admit: 2020-04-15 | Discharge: 2020-04-15 | Disposition: A | Discharge status: Home / Self Care | Payer: Medicare Other | Attending: Gastroenterology | Admitting: Gastroenterology

## 2020-04-15 ENCOUNTER — Encounter (HOSPITAL_COMMUNITY)
Admission: RE | Disposition: A | Discharge status: Home / Self Care | Payer: Self-pay | Source: Home / Self Care | Attending: Gastroenterology

## 2020-04-15 ENCOUNTER — Other Ambulatory Visit: Payer: Self-pay

## 2020-04-15 ENCOUNTER — Ambulatory Visit (HOSPITAL_COMMUNITY): Payer: Medicare Other | Admitting: Anesthesiology

## 2020-04-15 ENCOUNTER — Encounter (HOSPITAL_COMMUNITY): Payer: Self-pay | Admitting: Gastroenterology

## 2020-04-15 DIAGNOSIS — Z833 Family history of diabetes mellitus: Secondary | ICD-10-CM | POA: Diagnosis not present

## 2020-04-15 DIAGNOSIS — I252 Old myocardial infarction: Secondary | ICD-10-CM | POA: Insufficient documentation

## 2020-04-15 DIAGNOSIS — Z888 Allergy status to other drugs, medicaments and biological substances status: Secondary | ICD-10-CM | POA: Insufficient documentation

## 2020-04-15 DIAGNOSIS — K7469 Other cirrhosis of liver: Secondary | ICD-10-CM

## 2020-04-15 DIAGNOSIS — Z8673 Personal history of transient ischemic attack (TIA), and cerebral infarction without residual deficits: Secondary | ICD-10-CM | POA: Insufficient documentation

## 2020-04-15 DIAGNOSIS — K754 Autoimmune hepatitis: Secondary | ICD-10-CM

## 2020-04-15 DIAGNOSIS — K3189 Other diseases of stomach and duodenum: Secondary | ICD-10-CM | POA: Insufficient documentation

## 2020-04-15 DIAGNOSIS — Z880 Allergy status to penicillin: Secondary | ICD-10-CM | POA: Insufficient documentation

## 2020-04-15 DIAGNOSIS — Z791 Long term (current) use of non-steroidal anti-inflammatories (NSAID): Secondary | ICD-10-CM | POA: Insufficient documentation

## 2020-04-15 DIAGNOSIS — Z87891 Personal history of nicotine dependence: Secondary | ICD-10-CM | POA: Diagnosis not present

## 2020-04-15 DIAGNOSIS — K766 Portal hypertension: Secondary | ICD-10-CM | POA: Insufficient documentation

## 2020-04-15 DIAGNOSIS — Z950 Presence of cardiac pacemaker: Secondary | ICD-10-CM | POA: Diagnosis not present

## 2020-04-15 DIAGNOSIS — Z8711 Personal history of peptic ulcer disease: Secondary | ICD-10-CM | POA: Diagnosis not present

## 2020-04-15 DIAGNOSIS — K2289 Other specified disease of esophagus: Secondary | ICD-10-CM | POA: Diagnosis not present

## 2020-04-15 DIAGNOSIS — K2970 Gastritis, unspecified, without bleeding: Secondary | ICD-10-CM | POA: Diagnosis not present

## 2020-04-15 DIAGNOSIS — Z885 Allergy status to narcotic agent status: Secondary | ICD-10-CM | POA: Diagnosis not present

## 2020-04-15 DIAGNOSIS — Z9109 Other allergy status, other than to drugs and biological substances: Secondary | ICD-10-CM | POA: Insufficient documentation

## 2020-04-15 DIAGNOSIS — Z881 Allergy status to other antibiotic agents status: Secondary | ICD-10-CM | POA: Insufficient documentation

## 2020-04-15 DIAGNOSIS — K209 Esophagitis, unspecified without bleeding: Secondary | ICD-10-CM

## 2020-04-15 DIAGNOSIS — D709 Neutropenia, unspecified: Secondary | ICD-10-CM

## 2020-04-15 DIAGNOSIS — E039 Hypothyroidism, unspecified: Secondary | ICD-10-CM | POA: Diagnosis not present

## 2020-04-15 DIAGNOSIS — K7460 Unspecified cirrhosis of liver: Secondary | ICD-10-CM | POA: Diagnosis not present

## 2020-04-15 DIAGNOSIS — I8510 Secondary esophageal varices without bleeding: Secondary | ICD-10-CM

## 2020-04-15 DIAGNOSIS — K259 Gastric ulcer, unspecified as acute or chronic, without hemorrhage or perforation: Secondary | ICD-10-CM | POA: Diagnosis not present

## 2020-04-15 DIAGNOSIS — K862 Cyst of pancreas: Secondary | ICD-10-CM

## 2020-04-15 HISTORY — PX: BIOPSY: SHX5522

## 2020-04-15 HISTORY — PX: ESOPHAGOGASTRODUODENOSCOPY (EGD) WITH PROPOFOL: SHX5813

## 2020-04-15 SURGERY — ESOPHAGOGASTRODUODENOSCOPY (EGD) WITH PROPOFOL
Anesthesia: Monitor Anesthesia Care

## 2020-04-15 MED ORDER — FAMOTIDINE 20 MG PO TABS: 20.0000 mg | ORAL_TABLET | Freq: Two times a day (BID) | ORAL | 1 refills | Status: DC

## 2020-04-15 MED ORDER — PROPOFOL 10 MG/ML IV BOLUS
INTRAVENOUS | Status: DC | PRN
  Administered 2020-04-15: 10 mg via INTRAVENOUS
  Administered 2020-04-15: 20 mg via INTRAVENOUS

## 2020-04-15 MED ORDER — LACTATED RINGERS IV SOLN: INTRAVENOUS | Status: DC

## 2020-04-15 MED ORDER — PROPOFOL 500 MG/50ML IV EMUL
INTRAVENOUS | Status: DC | PRN
  Administered 2020-04-15: 75 ug/kg/min via INTRAVENOUS

## 2020-04-15 MED ORDER — SODIUM CHLORIDE 0.9 % IV SOLN: INTRAVENOUS | Status: DC

## 2020-04-15 SURGICAL SUPPLY — 15 items

## 2020-04-15 NOTE — Op Note (Signed)
[facility] Patient Name: Stacey Velazquez Procedure Date: 04/15/2020 MRN: [id number] Attending MD: Thornton Park MD, MD Date of Birth: 31-Oct-1945 CSN: [id number] Age: 75 Admit Type: Outpatient Procedure:                Upper GI endoscopy Indications:              Follow-up of gastric ulcer seen in 2021, Cirrhosis                            with suspected esophageal varices, ongoing reflux Providers:                Thornton Park MD, MD, Particia Nearing, RN, Ladona Ridgel, Technician Referring MD:              Medicines:                Monitored Anesthesia Care Complications:            No immediate complications. Estimated blood loss:                            Minimal. Estimated Blood Loss:     Estimated blood loss was minimal. Procedure:                Pre-Anesthesia Assessment:                           - Prior to the procedure, a History and Physical                            was performed, and patient medications and                            allergies were reviewed. The patient's tolerance of                            previous anesthesia was also reviewed. The risks                            and benefits of the procedure and the sedation                            options and risks were discussed with the patient.                            All questions were answered, and informed consent                            was obtained. Prior Anticoagulants: The patient has                            taken no previous anticoagulant or antiplatelet  agents. ASA Grade Assessment: III - A patient with                            severe systemic disease. After reviewing the risks                            and benefits, the patient was deemed in                            satisfactory condition to undergo the procedure.                           After obtaining informed consent, the endoscope was                             passed under direct vision. Throughout the                            procedure, the patient's blood pressure, pulse, and                            oxygen saturations were monitored continuously. The                            GIF-H190 ([id number]) Olympus gastroscope was                            introduced through the mouth, and advanced to the                            third part of duodenum. The upper GI endoscopy was                            accomplished without difficulty. The patient                            tolerated the procedure well. Scope In: Scope Out: Findings:      Four columns of grade II varices with no bleeding and no stigmata of       recent bleeding were found in the lower third of the esophagus,. No red       wale signs were present.      The Z-line was nodular and was found 39 cm from the incisors. Biopsies       were taken with a cold forceps for histology. Estimated blood loss was       minimal.      Portal hypertensive gastropathy was found in the gastric fundus and in       the gastric body.      Minimal inflammation was found in the gastric body. Scattered erosions       were present.      The examined duodenum was normal.      The cardia and gastric fundus were normal on retroflexion. No gastric       varices.      The exam was otherwise without abnormality. Impression:               -  Grade II esophageal varices with no bleeding and                            no stigmata of recent bleeding.                           - Nodularity at the Z-line. Biopsied.                           - Portal hypertensive gastropathy without gastric                            varices.                           - Gastritis. Previously identified ulcer has healed.                           - Normal examined duodenum.                           - The examination was otherwise normal. Moderate Sedation:      Not Applicable - Patient had care per  Anesthesia. Recommendation:           - Discharge patient to home.                           - Resume previous diet.                           - Continue present medications.                           - Await pathology results.                           - Follow-up in the office as previously planned. Procedure Code(s):        --- Professional ---                           [id number], Esophagogastroduodenoscopy, flexible,                            transoral; with biopsy, single or multiple Diagnosis Code(s):        --- Professional ---                           K74.60, Unspecified cirrhosis of liver                           I85.10, Secondary esophageal varices without                            bleeding                           K22.8, Other specified diseases of esophagus  K76.6, Portal hypertension                           K31.89, Other diseases of stomach and duodenum                           K29.70, Gastritis, unspecified, without bleeding                           K25.9, Gastric ulcer, unspecified as acute or                            chronic, without hemorrhage or perforation CPT copyright 2019 American [employer] Association. All rights reserved. The codes documented in this report are preliminary and upon coder review may  be revised to meet current compliance requirements. Thornton Park MD, MD 04/15/2020 9:12:10 AM This report has been signed electronically. Number of Addenda: 0

## 2020-04-15 NOTE — Anesthesia Postprocedure Evaluation (Signed)
Anesthesia Post Note  Patient: Stacey Velazquez  Procedure(s) Performed: ESOPHAGOGASTRODUODENOSCOPY (EGD) WITH PROPOFOL (N/A ) BIOPSY     Patient location during evaluation: PACU Anesthesia Type: MAC Level of consciousness: awake and alert and oriented Pain management: pain level controlled Vital Signs Assessment: post-procedure vital signs reviewed and stable Respiratory status: spontaneous breathing, nonlabored ventilation and respiratory function stable Cardiovascular status: blood pressure returned to baseline Postop Assessment: no apparent nausea or vomiting Anesthetic complications: no   No complications documented.  Last Vitals:  Vitals:   04/15/20 0907 04/15/20 0910  BP: 106/63 120/68  Pulse: 60 63  Resp: 16 19  Temp:    SpO2: 100% 100%         Brennan Bailey

## 2020-04-15 NOTE — Transfer of Care (Signed)
Immediate Anesthesia Transfer of Care Note  Patient: DEVRA STARE  Procedure(s) Performed: ESOPHAGOGASTRODUODENOSCOPY (EGD) WITH PROPOFOL (N/A ) BIOPSY  Patient Location: PACU  Anesthesia Type:MAC  Level of Consciousness: drowsy  Airway & Oxygen Therapy: Patient Spontanous Breathing and Patient connected to face mask oxygen  Post-op Assessment: Report given to RN and Post -op Vital signs reviewed and stable  Post vital signs: Reviewed and stable  Last Vitals:  Vitals Value Taken Time  BP    Temp    Pulse 60 04/15/20 0904  Resp 14 04/15/20 0904  SpO2 100 % 04/15/20 0904  Vitals shown include unvalidated device data.  Last Pain:  Vitals:   04/15/20 0727  TempSrc: Oral  PainSc: 9          Complications: No complications documented.

## 2020-04-15 NOTE — H&P (Signed)
Referring Provider: No ref. provider found Primary Care Physician:  Libby Maw, MD  Chief complaint:  Autoimmune hepatitis   IMPRESSION:  Cirrhosis due to AIH    - 2/21 labs show a MELD of 11, CPT score 7    - hypoalbuminemia, mild coagulopathy, and thrombocytopenia.  Autoimmune hepatitis (AIH) previously on azathioprine    - Diagnosed in 2006    - Liver biopsy 2006: Grade 3, stage I autoimmune hepatitis    - Previously on azathioprine and steroids    - Last seen in the [facility] hepatology clinic 2014    - Most recently treated by Dr. Benson Norway, last in 2018    - IgG 3501 02/27/19, 3164 05/01/19, 2620 06/21/19    - resumed azathioprine 100 mg QD 02/2019    - switched to Prograf 2/21 due to increasing IgG and progressive pancytopenia Pancreatic tail cystic lesion    - MRI 08/31/19: 0.8cm tail lesion without high risk features, likely side branch IPMN    - follow-up MRI with and without contrast recommended in 2 years Gastric ulcer on EGD 02/2019    - H pylori negative on biopsies    - follow-up recommended in 3 months Esophageal varices on EGD 02/10/16 Benson Norway)    - grade 2 esophageal varices on EGD 02/2019 History of advanced colon polyps    - Multiple colonoscopies with tubular adenomas and tubulovillous adenomas    - Colonoscopy 2018 at [facility]: 77m polyp in the ascending and at the hepatic flexure    - History of 20+mm distal rectal polyp on colonoscopy 03/06/19, 3 TAs, 2 HPs    - 2.6 cm rectal tubulovillous adenoma s/p TEM partial proctectomy 05/10/19 (Johney Maine    - Surveillance colonoscopy due 2024 Uses a walker at home, wheelchair when out of her apartment Transportation issues  Gastric ulcer: PPI BID. She is due repeat EGD. Avoid NSAIDs.     PLAN: - EGD today  I spent over 50 minutes of time, including in depth chart review, independent review of results as outlined above, communicating results with the patient directly, face-to-face time with the patient, coordinating care,  ordering studies and medications as appropriate, and documentation.    HPI: JTENIOLA TSENGis a 75y.o. female who presents for EGD to follow-up on prior gastric ulcer.  Recent Labs: 02/24/15: IgG 3092 02/27/19: IgG 3501 03/24/19 showed normal CMP except for Na 133, glucose 110, calcium 7.9, albumin 2.4, AST 67, ALT 55. WBC 4.4, hgb 13.4, platelets 78, MCV 94.5, INR 1.4.   05/01/19: TB 1.0, AST 32, ALT 17, alk phos 72, alb 2.9, WBC 1.8, hgb 13.1, platelets 77, ANC 1.0, ALC 0.5, IgG 3164 06/21/19: TB 0.8, AST 32, ALT 19, alk phos 67, alb 2.9, WBC 1.5, hgb 12, platelets 90, ANC 0.8, ALT 0.5, IgG 2620 07/19/19: TB 1.0, AST 30, ALT 17, alk phos 65, alb 2.9, WBC 1.3, ANC 0.7, ALC 0.5, hgb 11.7, platelets 65 08/17/2019: Total bilirubin 0.8, AST 33, ALT 19, alk phos 65, albumin 3.0, WBC 1.3, hemoglobin 12.1, platelets 63, IgG 2497 09/18/2019: TB 0.8, AST 34, ALT 19, alk phos 66, albumin 2.9, WBC 1.2, hemoglobin 12.0, platelets 57, IgG 2633 10/19/2019: T bilirubin 0.8, AST 29, ALT 19, alk phos 73, albumin 3.0, WBC 1.1, hemoglobin 12.5, platelets 52, IgG 2568 10/26/2019: WBC 1.3, hemoglobin 12.2, platelets 54 11/02/2019: Total bilirubin 0.9, AST 29, ALT 20, alk phos 67, albumin 2.9, WBC 1.2, hemoglobin 12.3, platelets 54, IgG 2376 12/06/2019: White count 1.1, hemoglobin  12.2, platelets 41, normal liver enzymes, IgG 2271 01/07/2020: White count 1.4, hemoglobin 12.4, platelets 47, IgG 2462, liver enzymes normal 01/31/2020: White count 1.6, hemoglobin 13, platelets 52, IgG 2411, normal liver enzymes    Endoscopic History: EGD 03/06/19: Grade II varices were found in the middle third of the esophagus. Moderate portal hypertensive gastropathy. Gastritis. One non-bleeding cratered gastric ulcer with no stigmata of bleeding was found on the lesser curvature of the stomach. There was no H pylori.  Colonoscopy with Dr. Benson Norway 02/10/2016 showed 6 polyps.  4 polyps ranged from 3 to 6 mm were removed and the descending: And  transverse colon.  There were 2 polyps ranging from 30 to 40 mm that were biopsied and tattooed in the ascending colon and transverse.  Pathology report showed tubulous villous adenoma of high-grade dysplasia.  The patient was referred to [facility].   Colonoscopy with Dr. Stephanie Acre at [facility] 05/26/16: "The perianal and digital rectal examinations were normal. A greater than 50 mm polyp was found in the proximal ascending colon. The polyp was sessile. Preparations were made for mucosal resection. Saline with methylene blue was injected to raise the lesion. Snare mucosal resection was performed. Resection and retrieval were complete. Coagulation for tissue destruction using snare was successful. A 50 mm polyp was found in the hepatic flexure. The polyp was semi-pedunculated. Preparations were made for mucosal resection. Saline with methylene blue was injected to raise the lesion. Snare mucosal resection was performed. Resection and retrieval were complete. Coagulation for tissue destruction using snare was successful. The exam was otherwise without abnormality on direct and retroflexion  views." Pathology revealed Tubulovillous adenoma (multiple fragments) with multiple foci of high-grade dysplasia.  Surveillance colonoscopy recommended in 1 year.  Colonoscopy 03/06/19 showed a 20 mm polyp in the distal rectum adjacent to the dentate line. Biopsies were consistent with tubulovillous adenoma with high-grade grandular dysplasia. She also had three 1 to 4 mm tubular adenomas and two hyperplastic polyps removed at that time.  TEM partial proctectomy of bleeding rectal mass with Dr. Johney Maine.  Pathology revealed a 2.6 cm tubulovillous adenoma.  Margins were uninvolved by dysplasia.  Recent abdominal imaging: Abdominal ultrasound1/25/2017: changes of cirrhosis with a subtle nodularity to the liver contours.  No focal abnormality.  Prior cholecystectomy.  Abdominal ultrasound 03/05/19: cirrhosis and splenomegaly. There was no ascites.   MRI 09/01/19: cirrhosis without liver mass. Splenomegaly. No ascites. Collateral gastroesophageal and paraumbilical varices. 0.8 cm pancreatic tail cystic lesion CT abdomen pelvis with contrast 11/15/2019 for lower left quadrant pain: Cirrhosis, splenomegaly, spontaneous splenorenal shunt, sigmoid diverticulosis, aortic atherosclerosis   Past Medical History:  Diagnosis Date  . Adenomatous polyp of ascending colon   . Anxiety   . Bilateral lower extremity edema    burning sensations  . Bipolar 1 disorder ([place])   . Carpal tunnel syndrome   . Cirrhosis of liver ([place])   . Cutaneous horn   . Drug-induced parkinsonism [facility])    patient unaware  . Esophageal varices ([place])   . Fibromyalgia    patient denies  . Gastric ulcer   . GERD (gastroesophageal reflux disease)   . Gingival abscess 08/26/2016  . Glaucoma 2003   pt unaware  . Heart attack ([facility])   . Heart disease   . Hepatitis, autoimmune ([place]) 08/25/2014  . History of iron deficiency anemia   . Hypotension   . Hypothyroidism   . Migraines   . Neutropenia ([place])   . Osteoporosis   . Pacemaker Medtronic  MRI compatible  . Paranoid schizophrenia ([place])   . Polyarthritis   . Polyosteoarthritis   . Rectal mass   . Seizures ([place])    last sz 08/21/14  . Sinus arrest   . Skin cancer    forehead  . SSS (sick sinus syndrome) ([place])   . Stroke ([place])   . Thrombocytopenia ([place])   . Vertigo    bvvp    Past Surgical History:  Procedure Laterality Date  . BIOPSY  03/06/2019   Procedure: BIOPSY;  Surgeon: Thornton Park, MD;  [facility];  Service: Gastroenterology;;  EGD and COLON  . CATARACT EXTRACTION Left 2008   pt unaware  . CHOLECYSTECTOMY    . COLONOSCOPY WITH PROPOFOL N/A 03/06/2019   Procedure: COLONOSCOPY WITH PROPOFOL;  Surgeon: Thornton Park, MD;  [facility];  Service: Gastroenterology;  Laterality: N/A;  . ESOPHAGOGASTRODUODENOSCOPY (EGD) WITH PROPOFOL N/A 03/06/2019   Procedure:  ESOPHAGOGASTRODUODENOSCOPY (EGD) WITH PROPOFOL ;  Surgeon: Thornton Park, MD;  [facility];  Service: Gastroenterology;  Laterality: N/A;  . KNEE SURGERY Left 1985  . PACEMAKER INSERTION  2014  . PARTIAL HYSTERECTOMY  1979  . PARTIAL PROCTECTOMY BY TEM N/A 05/10/2019   Procedure: TEM PARTIAL PROCTECTOMY OF RECTAL MASS WITH EXCISION RIGHT PERINEAL SKIN MASS;  Surgeon: Michael Boston, MD;  [facility];  Service: General;  Laterality: N/A;  . POLYPECTOMY  03/06/2019   Procedure: POLYPECTOMY;  Surgeon: Thornton Park, MD;  [facility];  Service: Gastroenterology;;  . REFRACTIVE SURGERY    . TUBAL LIGATION      Current Facility-Administered Medications  Medication Dose Route Frequency Provider Last Rate Last Admin  . 0.9 %  sodium chloride infusion   Intravenous Continuous Thornton Park, MD      . lactated ringers infusion   Intravenous Continuous Thornton Park, MD 125 mL/hr at 04/15/20 0746 New Bag at 04/15/20 0746    Allergies as of 02/14/2020 - Review Complete 02/14/2020  Allergen Reaction Noted  . Neomycin Nausea And Vomiting 05/10/2019  . Ambien [zolpidem tartrate] Other (See Comments) 02/21/2013  . Amitriptyline Other (See Comments) 02/21/2013  . Ampicillin Other (See Comments) 02/21/2013  . Anaprox [naproxen sodium] Other (See Comments) 02/21/2013  . Benadryl [diphenhydramine hcl (sleep)] Other (See Comments) 02/21/2013  . Cetirizine & related Other (See Comments) 02/21/2013  . Cortizone-10 [hydrocortisone] Other (See Comments) 02/21/2013  . Darvon [propoxyphene] Other (See Comments) 02/21/2013  . Diazepam Other (See Comments) 02/21/2013  . Diflunisal Swelling and Other (See Comments) 02/21/2013  . Duloxetine Other (See Comments) 02/21/2013  . Flexeril [cyclobenzaprine] Other (See Comments) 02/21/2013  . Lactose intolerance (gi) Swelling and Other (See Comments) 08/23/2014  . Lidocaine Other (See Comments) 02/21/2013  . Meperidine and related  Other (See Comments) 02/21/2013  . Metanx [l-methylfolate-algae-b12-b6] Other (See Comments) 02/21/2013  . Metoclopramide Other (See Comments) 02/21/2013  . Morphine and related Other (See Comments) 02/21/2013  . Nuprin [ibuprofen] Other (See Comments) 02/21/2013  . Oxycodone Other (See Comments) 02/21/2013  . Penicillins Other (See Comments) 02/21/2013  . Propoxyphene Other (See Comments) 02/21/2013  . Ranitidine Other (See Comments) 02/21/2013  . Vicodin [hydrocodone-acetaminophen] Other (See Comments) 02/21/2013  . Tylenol [acetaminophen] Rash 02/21/2013    Family History  Problem Relation Age of Onset  . Heart attack Father   . Heart disease Paternal Uncle   . Heart disease Paternal Grandmother   . Diabetes Paternal Grandmother   . Heart disease Paternal Grandfather   . Breast cancer Mother   . Cancer  Maternal Grandmother   . CVA Maternal Grandmother   . Cancer Maternal Grandfather   . CVA Maternal Grandfather   . Diabetes Sister   . Diabetes Brother   . Diabetes Brother   . Stroke Daughter      Physical Exam: General:   Alert,  well-nourished, pleasant and cooperative in NAD. Sitting in a wheelchair.  Heart:  Regular rate and rhythm; no murmurs Pulm: Clear anteriorly; no wheezing Abdomen:  Soft. Central obesity. Nontender. Nondistended. Normal bowel sounds. No rebound or guarding. No fluid wave.  LAD: No inguinal or umbilical LAD Extremities:  Without edema. Neurologic:  Alert and  oriented x4;  grossly normal neurologically; no asterixis or clonus. Skin: No jaundice. Palmar erythema. Spider angioma on the chest wall.  Violaceous lesion on the right forearm.  Psych:  Alert and cooperative. Normal mood and affect.     Calea Hribar L. Tarri Glenn, MD, MPH 04/15/2020, 8:28 AM

## 2020-04-15 NOTE — Discharge Instructions (Signed)
YOU HAD AN ENDOSCOPIC PROCEDURE TODAY: Refer to the procedure report and other information in the discharge instructions given to you for any specific questions about what was found during the examination. If this information does not answer your questions, please call [place] office at [phone] to clarify.  ° °YOU SHOULD EXPECT: Some feelings of bloating in the abdomen. Passage of more gas than usual. Walking can help get rid of the air that was put into your GI tract during the procedure and reduce the bloating.  ° °DIET: Your first meal following the procedure should be a light meal and then it is ok to progress to your normal diet. A half-sandwich or bowl of soup is an example of a good first meal. Heavy or fried foods are harder to digest and may make you feel nauseous or bloated. Drink plenty of fluids but you should avoid alcoholic beverages for 24 hours. ° °ACTIVITY: Your care partner should take you home directly after the procedure. You should plan to take it easy, moving slowly for the rest of the day. You can resume normal activity the day after the procedure however YOU SHOULD NOT DRIVE, use power tools, machinery or perform tasks that involve climbing or major physical exertion for 24 hours (because of the sedation medicines used during the test).  ° °SYMPTOMS TO REPORT IMMEDIATELY: °A gastroenterologist can be reached at any hour. Please call [phone]  for any of the following symptoms:  ° °Following upper endoscopy (EGD, EUS, ERCP, esophageal dilation) °Vomiting of blood or coffee ground material  °New, significant abdominal pain  °New, significant chest pain or pain under the shoulder blades  °Painful or persistently difficult swallowing  °New shortness of breath  °Black, tarry-looking or red, bloody stools ° °FOLLOW UP:  °If any biopsies were taken you will be contacted by phone or by letter within the next 1-3 weeks. Call [phone]  if you have not heard about the biopsies in 3 weeks.    °Please also call with any specific questions about appointments or follow up tests. ° °

## 2020-04-16 ENCOUNTER — Encounter (HOSPITAL_COMMUNITY): Payer: Self-pay | Admitting: Gastroenterology

## 2020-04-16 LAB — SURGICAL PATHOLOGY

## 2020-04-21 ENCOUNTER — Other Ambulatory Visit: Payer: Self-pay

## 2020-04-21 ENCOUNTER — Telehealth: Payer: Self-pay | Admitting: Gastroenterology

## 2020-04-21 ENCOUNTER — Encounter: Payer: Self-pay | Admitting: Family Medicine

## 2020-04-21 ENCOUNTER — Ambulatory Visit (INDEPENDENT_AMBULATORY_CARE_PROVIDER_SITE_OTHER): Payer: Medicare Other | Admitting: Family Medicine

## 2020-04-21 VITALS — BP 110/70 | HR 50 | Temp 97.2°F | Ht 69.0 in

## 2020-04-21 DIAGNOSIS — K121 Other forms of stomatitis: Secondary | ICD-10-CM

## 2020-04-21 DIAGNOSIS — R600 Localized edema: Secondary | ICD-10-CM

## 2020-04-21 DIAGNOSIS — K7469 Other cirrhosis of liver: Secondary | ICD-10-CM

## 2020-04-21 DIAGNOSIS — K766 Portal hypertension: Secondary | ICD-10-CM | POA: Insufficient documentation

## 2020-04-21 DIAGNOSIS — K754 Autoimmune hepatitis: Secondary | ICD-10-CM

## 2020-04-21 DIAGNOSIS — K3189 Other diseases of stomach and duodenum: Secondary | ICD-10-CM | POA: Insufficient documentation

## 2020-04-21 LAB — BASIC METABOLIC PANEL
BUN: 15 mg/dL (ref 6–23)
CO2: 22 mEq/L (ref 19–32)
Calcium: 8.4 mg/dL (ref 8.4–10.5)
Chloride: 96 mEq/L (ref 96–112)
Creatinine, Ser: 1.06 mg/dL (ref 0.40–1.20)
GFR: 51.27 mL/min — ABNORMAL LOW (ref >60.00)
Glucose, Bld: 100 mg/dL — ABNORMAL HIGH (ref 70–99)
Potassium: 4.9 mEq/L (ref 3.5–5.1)
Sodium: 123 mEq/L — ABNORMAL LOW (ref 135–145)
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
## 2020-04-23 NOTE — Telephone Encounter (Signed)
Attempted to reach the patient. No answer/machine

## 2020-04-24 ENCOUNTER — Other Ambulatory Visit (INDEPENDENT_AMBULATORY_CARE_PROVIDER_SITE_OTHER): Payer: Medicare Other

## 2020-04-24 DIAGNOSIS — I8500 Esophageal varices without bleeding: Secondary | ICD-10-CM

## 2020-04-24 DIAGNOSIS — K754 Autoimmune hepatitis: Secondary | ICD-10-CM

## 2020-04-24 DIAGNOSIS — D709 Neutropenia, unspecified: Secondary | ICD-10-CM

## 2020-04-24 DIAGNOSIS — K862 Cyst of pancreas: Secondary | ICD-10-CM

## 2020-04-24 DIAGNOSIS — D6959 Other secondary thrombocytopenia: Secondary | ICD-10-CM

## 2020-04-24 DIAGNOSIS — R748 Abnormal levels of other serum enzymes: Secondary | ICD-10-CM

## 2020-04-24 DIAGNOSIS — K7469 Other cirrhosis of liver: Secondary | ICD-10-CM

## 2020-04-24 DIAGNOSIS — T50905A Adverse effect of unspecified drugs, medicaments and biological substances, initial encounter: Secondary | ICD-10-CM | POA: Diagnosis not present

## 2020-04-24 LAB — CBC
HCT: 34.7 % — ABNORMAL LOW (ref 36.0–46.0)
Hemoglobin: 12.1 g/dL (ref 12.0–15.0)
MCHC: 34.7 g/dL (ref 30.0–36.0)
MCV: 91.8 fl (ref 78.0–100.0)
Platelets: 56 10*3/uL — ABNORMAL LOW (ref 150.0–400.0)
RBC: 3.78 Mil/uL — ABNORMAL LOW (ref 3.87–5.11)
RDW: 14.6 % (ref 11.5–15.5)
WBC: 1.6 10*3/uL — CL (ref 4.0–10.5)

## 2020-04-24 LAB — HEPATIC FUNCTION PANEL
ALT: 17 U/L (ref 0–35)
AST: 24 U/L (ref 0–37)
Albumin: 3.1 g/dL — ABNORMAL LOW (ref 3.5–5.2)
Alkaline Phosphatase: 71 U/L (ref 39–117)
Bilirubin, Direct: 0.3 mg/dL (ref 0.0–0.3)
Total Bilirubin: 1.1 mg/dL (ref 0.2–1.2)
Total Protein: 6.4 g/dL (ref 6.0–8.3)

## 2020-04-25 LAB — IGG: IgG (Immunoglobin G), Serum: 2063 mg/dL — ABNORMAL HIGH (ref 600–1540)

## 2020-04-25 NOTE — Telephone Encounter (Signed)
Attempted to call pt but the line is busy.

## 2020-04-29 NOTE — Telephone Encounter (Signed)
Attempted to reach pt again, continue to get busy signal. Referral has been faxed for pt to see [facility] at their [place] office due to transportation issues.

## 2020-05-02 ENCOUNTER — Ambulatory Visit (INDEPENDENT_AMBULATORY_CARE_PROVIDER_SITE_OTHER): Payer: Medicare Other

## 2020-05-02 DIAGNOSIS — I495 Sick sinus syndrome: Secondary | ICD-10-CM

## 2020-05-05 NOTE — Telephone Encounter (Signed)
Spoke with patient. She is aware that referral has been faxed to [facility] in [place]. Patient has been provided their office address and phone number so that she can check with [facility] transportation. Patient is aware that their office will call her to set up an appt. Patient verbalized understanding and had no concerns at the end of the call.

## 2020-05-06 LAB — CUP PACEART REMOTE DEVICE CHECK
Battery Remaining Longevity: 34 mo
Battery Voltage: 2.96 V
Brady Statistic AP VP Percent: 0.05 %
Brady Statistic AP VS Percent: 63.37 %
Brady Statistic AS VP Percent: 0.03 %
Brady Statistic AS VS Percent: 36.55 %
Brady Statistic RA Percent Paced: 63.4 %
Brady Statistic RV Percent Paced: 0.09 %
Date Time Interrogation Session: 20220408104628
Implantable Lead Implant Date: 20141205
Implantable Lead Implant Date: 20141205
Implantable Lead Location: 753859
Implantable Lead Location: 753860
Implantable Lead Model: 5076
Implantable Lead Model: 5076
Implantable Pulse Generator Implant Date: 20141205
Lead Channel Impedance Value: 304 Ohm
Lead Channel Impedance Value: 380 Ohm
Lead Channel Impedance Value: 399 Ohm
Lead Channel Impedance Value: 437 Ohm
Lead Channel Pacing Threshold Amplitude: 0.375 V
Lead Channel Pacing Threshold Amplitude: 0.875 V
Lead Channel Pacing Threshold Pulse Width: 0.4 ms
Lead Channel Pacing Threshold Pulse Width: 0.4 ms
Lead Channel Sensing Intrinsic Amplitude: 1.75 mV
Lead Channel Sensing Intrinsic Amplitude: 1.75 mV
Lead Channel Sensing Intrinsic Amplitude: 3.125 mV
Lead Channel Sensing Intrinsic Amplitude: 3.125 mV
Lead Channel Setting Pacing Amplitude: 2 V
Lead Channel Setting Pacing Amplitude: 2.5 V
Lead Channel Setting Pacing Pulse Width: 0.4 ms
Lead Channel Setting Sensing Sensitivity: 0.6 mV

## 2020-05-08 ENCOUNTER — Telehealth: Payer: Self-pay

## 2020-05-08 ENCOUNTER — Telehealth: Payer: Self-pay | Admitting: *Deleted

## 2020-05-08 ENCOUNTER — Other Ambulatory Visit (INDEPENDENT_AMBULATORY_CARE_PROVIDER_SITE_OTHER): Payer: Medicare Other

## 2020-05-08 ENCOUNTER — Other Ambulatory Visit: Payer: Self-pay

## 2020-05-08 DIAGNOSIS — K754 Autoimmune hepatitis: Secondary | ICD-10-CM | POA: Diagnosis not present

## 2020-05-08 DIAGNOSIS — K7469 Other cirrhosis of liver: Secondary | ICD-10-CM

## 2020-05-08 DIAGNOSIS — T50905A Adverse effect of unspecified drugs, medicaments and biological substances, initial encounter: Secondary | ICD-10-CM

## 2020-05-08 DIAGNOSIS — D6959 Other secondary thrombocytopenia: Secondary | ICD-10-CM

## 2020-05-08 DIAGNOSIS — D709 Neutropenia, unspecified: Secondary | ICD-10-CM

## 2020-05-08 DIAGNOSIS — D72818 Other decreased white blood cell count: Secondary | ICD-10-CM

## 2020-05-08 DIAGNOSIS — R748 Abnormal levels of other serum enzymes: Secondary | ICD-10-CM

## 2020-05-08 DIAGNOSIS — K862 Cyst of pancreas: Secondary | ICD-10-CM | POA: Diagnosis not present

## 2020-05-08 DIAGNOSIS — I8500 Esophageal varices without bleeding: Secondary | ICD-10-CM

## 2020-05-08 LAB — CBC
HCT: 29.8 % — ABNORMAL LOW (ref 36.0–46.0)
Hemoglobin: 10.6 g/dL — ABNORMAL LOW (ref 12.0–15.0)
MCHC: 35.5 g/dL (ref 30.0–36.0)
MCV: 90.7 fl (ref 78.0–100.0)
Platelets: 53 10*3/uL — ABNORMAL LOW (ref 150.0–400.0)
RBC: 3.29 Mil/uL — ABNORMAL LOW (ref 3.87–5.11)
RDW: 14.8 % (ref 11.5–15.5)
WBC: 1.3 10*3/uL — CL (ref 4.0–10.5)

## 2020-05-08 LAB — HEPATIC FUNCTION PANEL
ALT: 18 U/L (ref 0–35)
AST: 24 U/L (ref 0–37)
Albumin: 2.9 g/dL — ABNORMAL LOW (ref 3.5–5.2)
Alkaline Phosphatase: 66 U/L (ref 39–117)
Bilirubin, Direct: 0.2 mg/dL (ref 0.0–0.3)
Total Bilirubin: 1 mg/dL (ref 0.2–1.2)
Total Protein: 6.1 g/dL (ref 6.0–8.3)

## 2020-05-08 MED ORDER — DEXLANSOPRAZOLE 60 MG PO CPDR: 60.0000 mg | DELAYED_RELEASE_CAPSULE | Freq: Every day | ORAL | 3 refills | Status: DC

## 2020-05-08 NOTE — Telephone Encounter (Signed)
I discontinued current PPI - pantoprazole as well as famotidine (Pepcid)  I rxed dexilant - one pill a day but explain it releases medication again  about 8 hours after she takes it so as good opr better than twice a day  She can still add pepcid as needed if required   The other thing to consider is if she is getting fluid in her abdomen - ascites - is her weight up? Legs swollen? Belly bigger?  If so the fluid can squeeze the stomach and create reflux

## 2020-05-08 NOTE — Telephone Encounter (Signed)
Cherylann Parr, CMA took call today from lab with critical WBC results of 1.3 and immediately provided me with the results to inform doc on call. Last WBC 04/10/20 = 2.4; next OV scheduled with Dr. Tarri Glenn on 06/19/20; last OV with Dr. Lindi Adie on 11/07/19 for follow up Azathioprine-induced leukopenia. Routing this message to Dr. Lyndel Safe for him to review and address. Will await his response.

## 2020-05-08 NOTE — Telephone Encounter (Signed)
Received returned call from [place], [place] with Dr. Geralyn Flash office. States Dr. Lindi Adie is out of the office. However, according to last office note, Merleen Nicely states Dr. Lindi Adie would like [place] to be above 0.8. Would prefer to wait until results are received next week. Has tentatively scheduled pt to be seen by Dr. Lindi Adie on 05/20/20 @ 130pm, PENDING results of CBC w/ diff next week. Provided me with her direct line of [phone]. Asked that I call her to provide her with an update on the lab results to determine if appt should be changed to a sooner date/time OR if appt should be moved out further than 05/20/20. Advised I will call the pt to maker her aware of the TENTATIVE plan we have discussed. Expressed appreciation.  Called pt to make her aware of the orders I have placed today per Dr. Steve Rattler request, my conversation and Dr. Geralyn Flash plan as stated above. Pt confirmed she will call to make transportation arrangements with [employer], however, states she needs at least a 3 day notice per [employer]. Pt is aware and verbalized complete understanding that the appt on 4/26 is TENTATIVE, PENDING results of her labs next week. Pt is aware that she will receive a call from our office next week AFTER we receive the results of her labs AND have spoken to Dr. Geralyn Flash nurse for further instructions.  While on the phone with the pt, she expressed concerns about her Pepcid. States it is no longer controlling her symptoms. Further adds, it does not matter whether she takes the medication before or after eating, she develops horrible burping, awful stomach pains that result in her needing to use a heating pad as well as increased acid reflux causing discomfort in her throat and chest. States these are the same symptoms she experienced in the past when taking Prilosec. Wants to change to an alternative to the Pepcid and Prilosec. Advised Dr. Tarri Glenn is out of the office until Monday, 05/12/20 and that  her concerns will need to be sent to our doc of the day. Reminded that our doc of the day will not be Dr. Lyndel Safe, whom she spoke with earlier this morning. Advised I will call her with Dr. Celesta Aver orders once he has responded to my message. Verbalized acceptance and understanding. Routing this message to Dr. Carlean Purl and await his response.

## 2020-05-08 NOTE — Telephone Encounter (Signed)
PRIOR AUTHORIZATION  PA initiation date: 05/08/20  Medication: [place]: [facility] Submission completed electronically through [employer] My Meds: Yes  Will await insurance response re: approval/denial.  Donneta Romberg (Key: [id number])  [facility] is reviewing your PA request. Typically an electronic response will be received within 24-72 hours. To check for an update later, open this request from your dashboard.  You may close this dialog and return to your dashboard to perform other tasks.  APPROVAL  Medication: [employer]: [facility] PA response: APPROVED Approval dates: 05/08/20 through 01/24/21 Misc. NotesLIZZET Velazquez Key: [id number] - PA Case ID: [id number] - Rx #: [id number] help? Call us at [phone] Outcome Approvedtoday Request Reference Number: [id number]. DEXILANT CAP 60MG  DR is approved through 01/24/2021. Your patient may now fill this prescription and it will be covered. Drug Dexilant 60MG  dr capsules Form [facility] Medicare Part D Electronic Prior Authorization Form (2017 NCPDP) Original Claim Info [phone] Provide Exception Process Printed Notice

## 2020-05-08 NOTE — Telephone Encounter (Signed)
Called Dr. Geralyn Flash nurse and LVM requesting returned call re: need to schedule appt. Will await returned call.

## 2020-05-08 NOTE — Telephone Encounter (Signed)
Received call from Amy, RN at [facility] stating pt WBC 1.3 and requesting pt to be evaluated by MD.  MD currently out of the office and next available apt is 05/20/20.  Per MD last office note, goal is to keep pt ANC greater than 0.8.  Labs drawn at [name] did not include ANC.  Amy, RN states pt will return to the office next week to re draw labs and will include [place] at that point. States pt is asymptomatic and is not experiencing any symptoms of neutropenia including fever or infection.  Follow up with MD scheduled and Amy, RN states she will update pt.

## 2020-05-08 NOTE — Telephone Encounter (Signed)
Called Stacey Velazquez and informed her about Dr. Celesta Aver response below. Verbalized acceptance and understanding. Stacey Velazquez denies wt gain, abdominal swelling, or edema to her lower extremities. Further added, "this is the smallest my feet and ankles have ever looked". Routing this message to Dr. Tarri Glenn for continuity of care purposes.

## 2020-05-08 NOTE — Telephone Encounter (Signed)
DOD  Strange enough-I still cannot see those labs in [facility] I have reviewed the chart and discussed with the patient  She is no longer taking azathioprine.  Plan: -Decrease Prograf to 2/day (done) -Recheck CBC with diff in 1 week. -Also check Prograf level in 1 week. -Please make appt with Dr Lindi Adie -first available.   RG

## 2020-05-09 LAB — IGG: IgG (Immunoglobin G), Serum: 1948 mg/dL — ABNORMAL HIGH (ref 600–1540)

## 2020-05-11 NOTE — Telephone Encounter (Signed)
Thanks for the update. Will await her follow-up labs this week. Thanks.

## 2020-05-12 ENCOUNTER — Other Ambulatory Visit: Payer: Self-pay | Admitting: Gastroenterology

## 2020-05-12 DIAGNOSIS — K754 Autoimmune hepatitis: Secondary | ICD-10-CM

## 2020-05-12 DIAGNOSIS — K862 Cyst of pancreas: Secondary | ICD-10-CM

## 2020-05-12 DIAGNOSIS — K7469 Other cirrhosis of liver: Secondary | ICD-10-CM

## 2020-05-12 DIAGNOSIS — D709 Neutropenia, unspecified: Secondary | ICD-10-CM

## 2020-05-15 ENCOUNTER — Telehealth: Payer: Self-pay

## 2020-05-15 ENCOUNTER — Other Ambulatory Visit (INDEPENDENT_AMBULATORY_CARE_PROVIDER_SITE_OTHER): Payer: Medicare Other

## 2020-05-15 DIAGNOSIS — D72818 Other decreased white blood cell count: Secondary | ICD-10-CM | POA: Diagnosis not present

## 2020-05-15 DIAGNOSIS — D709 Neutropenia, unspecified: Secondary | ICD-10-CM | POA: Diagnosis not present

## 2020-05-15 LAB — CBC WITH DIFFERENTIAL/PLATELET
Basophils Absolute: 0 10*3/uL (ref 0.0–0.1)
Basophils Relative: 0.2 % (ref 0.0–3.0)
Eosinophils Absolute: 0 10*3/uL (ref 0.0–0.7)
Eosinophils Relative: 0.6 % (ref 0.0–5.0)
HCT: 33.1 % — ABNORMAL LOW (ref 36.0–46.0)
Hemoglobin: 11.5 g/dL — ABNORMAL LOW (ref 12.0–15.0)
Lymphocytes Relative: 22.2 % (ref 12.0–46.0)
Lymphs Abs: 0.4 10*3/uL — ABNORMAL LOW (ref 0.7–4.0)
MCHC: 34.6 g/dL (ref 30.0–36.0)
MCV: 91.1 fl (ref 78.0–100.0)
Monocytes Absolute: 0.2 10*3/uL (ref 0.1–1.0)
Monocytes Relative: 13.4 % — ABNORMAL HIGH (ref 3.0–12.0)
Neutro Abs: 1.1 10*3/uL — ABNORMAL LOW (ref 1.4–7.7)
Neutrophils Relative %: 63.6 % (ref 43.0–77.0)
Platelets: 58 10*3/uL — ABNORMAL LOW (ref 150.0–400.0)
RBC: 3.63 Mil/uL — ABNORMAL LOW (ref 3.87–5.11)
RDW: 14.9 % (ref 11.5–15.5)
WBC: 1.8 10*3/uL — CL (ref 4.0–10.5)

## 2020-05-15 NOTE — Telephone Encounter (Signed)
Cal from [employer] said that this patient's  White Blood Count at 1.8 and that is low. Any advise?

## 2020-05-15 NOTE — Telephone Encounter (Signed)
Closing this encounter as duplicate as this is being addressed in a previously created encounter.

## 2020-05-15 NOTE — Telephone Encounter (Signed)
Following message received from [facility] [place] and Rosanne Sack, RN:  Critical Lab ([facility])  Algernon Huxley, RN 13 minutes ago (11:24 AM)       This is actually a little higher than last week. She runs low. Will await Dr. Tarri Glenn input.      Documentation    Harp, [place], CMA routed conversation to Dennis Bast; Thornton Park, MD; Algernon Huxley, RN 18 minutes ago (11:19 AM)   Villa Herb, CMA 20 minutes ago (11:16 AM)       Cal from [facility] said that this patient's  White Blood Count at 1.8 and that is low. Any advise?      Documentation     For continuity purposes, will route to Dr. Tarri Glenn and oncology.

## 2020-05-15 NOTE — Progress Notes (Signed)
DOD  White cell count is better than before Prograf has been reduced to 2/day (from 3/day) Await Prograf level. Please make sure she has appointment with Dr. Sonny Dandy (hem/onc) Please inform the patient to continue same medications for now  RG

## 2020-05-15 NOTE — Progress Notes (Signed)
Remote pacemaker transmission.   

## 2020-05-15 NOTE — Telephone Encounter (Signed)
This is actually a little higher than last week. She runs low. Will await Dr. Tarri Glenn input.

## 2020-05-16 ENCOUNTER — Telehealth: Payer: Self-pay | Admitting: Gastroenterology

## 2020-05-16 LAB — TACROLIMUS LEVEL: Tacrolimus (FK506), Blood: 14.9 ng/mL

## 2020-05-16 NOTE — Telephone Encounter (Signed)
Snap shot printed out and mailed to pt as requested.

## 2020-05-16 NOTE — Telephone Encounter (Signed)
Pt called requesting a list of all her allergies and medications including OTC.

## 2020-05-20 ENCOUNTER — Telehealth: Payer: Self-pay | Admitting: Hematology and Oncology

## 2020-05-20 ENCOUNTER — Inpatient Hospital Stay: Payer: Medicare Other | Admitting: Hematology and Oncology

## 2020-05-20 NOTE — Telephone Encounter (Signed)
Sch per 4/25 sch msg, Called left message, mailed calendar.

## 2020-06-05 DIAGNOSIS — K7469 Other cirrhosis of liver: Secondary | ICD-10-CM | POA: Diagnosis not present

## 2020-06-05 DIAGNOSIS — K754 Autoimmune hepatitis: Secondary | ICD-10-CM | POA: Diagnosis not present

## 2020-06-13 ENCOUNTER — Other Ambulatory Visit: Payer: Self-pay | Admitting: Gastroenterology

## 2020-06-13 DIAGNOSIS — K7469 Other cirrhosis of liver: Secondary | ICD-10-CM

## 2020-06-13 DIAGNOSIS — D709 Neutropenia, unspecified: Secondary | ICD-10-CM

## 2020-06-13 DIAGNOSIS — K862 Cyst of pancreas: Secondary | ICD-10-CM

## 2020-06-13 DIAGNOSIS — K754 Autoimmune hepatitis: Secondary | ICD-10-CM

## 2020-06-18 ENCOUNTER — Other Ambulatory Visit: Payer: Self-pay | Admitting: Family Medicine

## 2020-06-18 DIAGNOSIS — R600 Localized edema: Secondary | ICD-10-CM

## 2020-06-18 NOTE — Telephone Encounter (Signed)
Refill request for: Spironolactone 25 mg LR 03/24/20, #45, 0 rf LOV 04/21/20 FOV  None scheduled.   Please review and advise.  Thanks.  Dm/cma

## 2020-06-19 ENCOUNTER — Ambulatory Visit (INDEPENDENT_AMBULATORY_CARE_PROVIDER_SITE_OTHER): Payer: Medicare Other | Admitting: Gastroenterology

## 2020-06-19 ENCOUNTER — Encounter: Payer: Self-pay | Admitting: Gastroenterology

## 2020-06-19 VITALS — BP 138/80 | HR 82 | Wt 210.0 lb

## 2020-06-19 DIAGNOSIS — K5900 Constipation, unspecified: Secondary | ICD-10-CM | POA: Diagnosis not present

## 2020-06-19 DIAGNOSIS — D709 Neutropenia, unspecified: Secondary | ICD-10-CM | POA: Diagnosis not present

## 2020-06-19 DIAGNOSIS — K754 Autoimmune hepatitis: Secondary | ICD-10-CM | POA: Diagnosis not present

## 2020-06-19 DIAGNOSIS — K7469 Other cirrhosis of liver: Secondary | ICD-10-CM

## 2020-06-19 DIAGNOSIS — R748 Abnormal levels of other serum enzymes: Secondary | ICD-10-CM

## 2020-06-19 MED ORDER — FAMOTIDINE 20 MG PO TABS: 20.0000 mg | ORAL_TABLET | Freq: Every day | ORAL | 0 refills | Status: DC

## 2020-06-19 NOTE — Progress Notes (Signed)
Referring Provider: Libby Maw,* Primary Care Physician:  Libby Maw, MD  Chief complaint:  Autoimmune hepatitis   IMPRESSION:  Cirrhosis due to AIH    - 2/21 labs show a MELD of 11, CPT score 7    - hypoalbuminemia, mild coagulopathy, and thrombocytopenia  Autoimmune hepatitis (AIH) previously on azathioprine    - Diagnosed in 2006    - Liver biopsy 2006: Grade 3, stage I autoimmune hepatitis    - Previously on azathioprine and steroids    - Last seen in the [facility] hepatology clinic 2014    - Most recently treated by Dr. Benson Norway, last in 2018    - IgG 3501 02/27/19, 3164 05/01/19, 2620 06/21/19    - resumed azathioprine 100 mg QD 02/2019    - switched to Prograf 2/21 due to increasing IgG and progressive pancytopenia Pancreatic tail cystic lesion    - Stable 0.8cm tail lesion without high risk features, likely side branch IPMN    - follow-up MRI with and without contrast recommended in 2 years Constipation with sense of incomplete evacuation    - ? Pelvic floor dysfunction following proctectomy 2021 Gastric ulcer on EGD 02/2019    - H pylori negative on biopsies    - Resolved on EGD 04/15/20 Esophageal varices on EGD 02/10/16 Benson Norway)    - grade 2 esophageal varices on EGD 02/2019 History of advanced colon polyps    - Multiple colonoscopies with tubular adenomas and tubulovillous adenomas    - Colonoscopy 2018 at [facility]: 80m polyp in the ascending and at the hepatic flexure    - History of 20+mm distal rectal polyp on colonoscopy 03/06/19, 3 TAs, 2 HPs    - 2.6 cm rectal tubulovillous adenoma s/p TEM partial proctectomy 05/10/19 (Johney Maine    - Surveillance colonoscopy due 2024 Uses a walker at home, wheelchair when out of her apartment Transportation issues  Autoimmune hepatitis: Serum IgG has started to decline on Prograf.   Pancytopenia: Improving on switch to Prograf. Continue close monitoring per hematology.   Cirrhosis by labs and imaging: Continue screening for  hepatocellular carcinoma recommended given her history of cirrhosis and esophageal varices. Proceed with cross-sectional screening in 6 months. MRI recommended given her body habitus.   Esophageal varices: Continue low dose non-selective beta-blocker therapy. Reviewed indications and side effect profile.  Stable pancreatic cyst: Stable over 1 year. Plan EUS with any changes.   Constipation with altered bowel habits: History sounds consistent with pelvic floor dysfunction.  Sitz Mark study recommended for more information.   Gastric ulcer on EGD 02/2019: H pylori negative on biopsies. Resolved on EGD 04/15/20. Continue PPI. Add H2Blocker for ongoing nausea.   PLAN: - Abstain from all alcohol - Continue tacrolimus 3 mg QD (#30 with 3 refills) - Continue labs every 2 weeks including hepatic function panel and IgG - Semi-annual labs: CMP, CBC, AFP, IgG and PT/INR -today - Continue omeprazole 40 mg BID - Add famotidine 20 mg BID - Avoid NSAIDs - Continue carvedilol 6.25 mg daily - Sitz Mark study - MRI for hepatocellular carcinoma screening due August 2022 - Surveillance Colonoscopy 04/2022 - Follow-up in this office in 3-4 months, earlier as needed  I spent over 50 minutes of time, including in depth chart review, independent review of results as outlined above, communicating results with the patient directly, face-to-face time with the patient, coordinating care, ordering studies and medications as appropriate, and documentation.      HPI: Stacey LEONis  a 75 y.o. female who returns in  follow-up for cirrhosis due to autoimmune hepatitis.  Her long-term azathioprine was recently switched to Prograf given progressive pancytopenia and loss of response identified as a progressive increasing IgG level.  She is tolerating the switch to Prograf without complication. Despite the switch to Prograf, she continues to have pancytopenia and we've been unsuccessful in decreasing her IgG while on  treatment.   No GI complaints today except for some intermittent reflux, intermittent nausea, and constipation with sense of incomplete evacuation. She feels like she might be obstructed.  She denies symptoms of jaundice, ascites, encephalopathy.  Seen at [facility] 06/05/20. I reviewed their records. They specifically recommended Prograf levels - consider increasing the dose; no additional follow-up planned. She was instructed to change the timing of when she takes her second dose of Prograf. She was also taught about trough levels. Apparently she had a Prograf level checked in April that was not a true trough.   Has an appointment with a podiatrist next week to help with her toe.   Recent Labs: 03/13/2020: Albumin 3.0, total bilirubin 1.1, AST 21, ALT 15, alk phos 70, white count 1.7, hemoglobin 12.7, platelets 49 03/24/2020: Sodium 132, creatinine 0.95 03/27/2020: Sodium 131, total bilirubin 1.2, creatinine 1.06, albumin 2.9, white count 1.7, hemoglobin 12, platelets 51, IgG 2041 04/10/2020: White count 2.4, hemoglobin 12.4, platelets 44, IgG 2110 04/24/2020: Normal liver enzymes, albumin 3.1, IgG 2063, white count 1.6, hemoglobin 12.1, platelets 56 05/08/20: Normal liver enzymes, albumin 2.9, IgG 1948, white count 1.3, hemoglobin 10.6, platelets 53 05/15/2020: White count 1.8, hemoglobin 11.5, platelets 58   Endoscopic History: EGD 03/06/19: Grade II varices were found in the middle third of the esophagus. Moderate portal hypertensive gastropathy. Gastritis. One non-bleeding cratered gastric ulcer with no stigmata of bleeding was found on the lesser curvature of the stomach. There was no H pylori.  Colonoscopy with Dr. Benson Norway 02/10/2016 showed 6 polyps.  4 polyps ranged from 3 to 6 mm were removed and the descending: And transverse colon.  There were 2 polyps ranging from 30 to 40 mm that were biopsied and tattooed in the ascending colon and transverse.  Pathology report showed tubulous villous  adenoma of high-grade dysplasia.  The patient was [facility]   Colonoscopy with Dr. Stephanie Acre at [facility] 05/26/16: "The perianal and digital rectal examinations were normal. A greater than 50 mm polyp was found in the proximal ascending colon. The polyp was sessile. Preparations were made for mucosal resection. Saline with methylene blue was injected to raise the lesion. Snare mucosal resection was performed. Resection and retrieval were complete. Coagulation for tissue destruction using snare was successful. A 50 mm polyp was found in the hepatic flexure. The polyp was semi-pedunculated. Preparations were made for mucosal resection. Saline with methylene blue was injected to raise the lesion. Snare mucosal resection was performed. Resection and retrieval were complete. Coagulation for tissue destruction using snare was successful. The exam was otherwise without abnormality on direct and retroflexion  views." Pathology revealed Tubulovillous adenoma (multiple fragments) with multiple foci of high-grade dysplasia.  Surveillance colonoscopy recommended in 1 year.  Colonoscopy 03/06/19 showed a 20 mm polyp in the distal rectum adjacent to the dentate line. Biopsies were consistent with tubulovillous adenoma with high-grade grandular dysplasia. She also had three 1 to 4 mm tubular adenomas and two hyperplastic polyps removed at that time.  TEM partial proctectomy of bleeding rectal mass with Dr. Johney Maine.  Pathology revealed a 2.6  cm tubulovillous adenoma.  Margins were uninvolved by dysplasia. EGD 04/15/20 showed 4 columns of grade 2 varices, portal hypertensive gastropathy, gastritis.  Gastric erosion had resolved.  Recent abdominal imaging: - Abdominal ultrasound1/25/2017: changes of cirrhosis with a subtle nodularity to the liver contours.  No focal abnormality.  Prior cholecystectomy.  - Abdominal ultrasound 03/05/19: cirrhosis and splenomegaly. There was no ascites.  - MRI 09/01/19: cirrhosis without liver mass.  Splenomegaly. No ascites. Collateral gastroesophageal and paraumbilical varices. 0.8 cm pancreatic tail cystic lesion - CT abdomen pelvis with contrast 11/15/2019 for lower left quadrant pain: Cirrhosis, splenomegaly, spontaneous splenorenal shunt, sigmoid diverticulosis, aortic atherosclerosis - MRI 03/05/2020: 8 mm pancreatic cyst unchanged likely representing an IPMN, cirrhosis, benign renal cysts   Past Medical History:  Diagnosis Date  . Adenomatous polyp of ascending colon   . Anxiety   . Bilateral lower extremity edema    burning sensations  . Bipolar 1 disorder ([place])   . Carpal tunnel syndrome   . Cirrhosis of liver ([place])   . Cutaneous horn   . Drug-induced parkinsonism [facility])    patient unaware  . Esophageal varices ([place])   . Fibromyalgia    patient denies  . Gastric ulcer   . GERD (gastroesophageal reflux disease)   . Gingival abscess 08/26/2016  . Glaucoma 2003   pt unaware  . Heart attack ([place])   . Heart disease   . Hepatitis, autoimmune ([place]) 08/25/2014  . History of iron deficiency anemia   . Hypotension   . Hypothyroidism   . Migraines   . Neutropenia ([place])   . Osteoporosis   . Pacemaker [employer]    MRI compatible  . Paranoid schizophrenia ([place])   . Polyarthritis   . Polyosteoarthritis   . Rectal mass   . Seizures ([place])    last sz 08/21/14  . Sinus arrest   . Skin cancer    forehead  . SSS (sick sinus syndrome) ([place])   . Stroke ([place])   . Thrombocytopenia ([place])   . Vertigo    bvvp    Past Surgical History:  Procedure Laterality Date  . BIOPSY  03/06/2019   Procedure: BIOPSY;  Surgeon: Thornton Park, MD;  Location: [facility];  Service: Gastroenterology;;  EGD and COLON  . BIOPSY  04/15/2020   Procedure: BIOPSY;  Surgeon: Thornton Park, MD;  Location: [facility];  Service: Gastroenterology;;  . CATARACT EXTRACTION Left 2008   pt unaware  . CHOLECYSTECTOMY    . COLONOSCOPY WITH PROPOFOL N/A 03/06/2019   Procedure: COLONOSCOPY WITH PROPOFOL;   Surgeon: Thornton Park, MD;  Location: [facility];  Service: Gastroenterology;  Laterality: N/A;  . ESOPHAGOGASTRODUODENOSCOPY (EGD) WITH PROPOFOL N/A 03/06/2019   Procedure: ESOPHAGOGASTRODUODENOSCOPY (EGD) WITH PROPOFOL ;  Surgeon: Thornton Park, MD;  Location: [facility];  Service: Gastroenterology;  Laterality: N/A;  . ESOPHAGOGASTRODUODENOSCOPY (EGD) WITH PROPOFOL N/A 04/15/2020   Procedure: ESOPHAGOGASTRODUODENOSCOPY (EGD) WITH PROPOFOL;  Surgeon: Thornton Park, MD;  Location: [facility];  Service: Gastroenterology;  Laterality: N/A;  . KNEE SURGERY Left 1985  . PACEMAKER INSERTION  2014  . PARTIAL HYSTERECTOMY  1979  . PARTIAL PROCTECTOMY BY TEM N/A 05/10/2019   Procedure: TEM PARTIAL PROCTECTOMY OF RECTAL MASS WITH EXCISION RIGHT PERINEAL SKIN MASS;  Surgeon: Michael Boston, MD;  Location: [facility];  Service: General;  Laterality: N/A;  . POLYPECTOMY  03/06/2019   Procedure: POLYPECTOMY;  Surgeon: Thornton Park, MD;  Location: [facility];  Service: Gastroenterology;;  . REFRACTIVE SURGERY    . TUBAL LIGATION  Current Outpatient Medications  Medication Sig Dispense Refill  . carvedilol (COREG) 6.25 MG tablet TAKE 1 TABLET (6.25 MG TOTAL) BY MOUTH DAILY. 30 tablet 2  . dexlansoprazole (DEXILANT) 60 MG capsule Take 1 capsule (60 mg total) by mouth daily. 90 capsule 3  . levothyroxine (SYNTHROID) 75 MCG tablet TAKE 1 TABLET (75 MCG TOTAL) BY MOUTH DAILY BEFORE BREAKFAST. 90 tablet 1  . Probiotic Product (DIGESTIVE ADV DIGESTIVE/IMMUNE) CHEW Chew 2 capsules by mouth at bedtime.    Marland Kitchen spironolactone (ALDACTONE) 25 MG tablet TAKE 1/2 TABLET (12.5 MG TOTAL) BY MOUTH DAILY. 45 tablet 0  . tacrolimus (PROGRAF) 1 MG capsule TAKE 3 CAPSULES (3 MG TOTAL) BY MOUTH DAILY. 90 capsule 3  . tamsulosin (FLOMAX) 0.4 MG CAPS capsule Take 0.4 mg by mouth at bedtime.     No current facility-administered medications for this visit.    Allergies as of 06/19/2020 - Review Complete  06/19/2020  Allergen Reaction Noted  . Neomycin Nausea And Vomiting 05/10/2019  . Ambien [zolpidem tartrate] Other (See Comments) 02/21/2013  . Amitriptyline Other (See Comments) 02/21/2013  . Ampicillin Other (See Comments) 02/21/2013  . Anaprox [naproxen sodium] Other (See Comments) 02/21/2013  . Benadryl [diphenhydramine hcl (sleep)] Other (See Comments) 02/21/2013  . Cetirizine & related Other (See Comments) 02/21/2013  . Cortizone-10 [hydrocortisone] Other (See Comments) 02/21/2013  . Darvon [propoxyphene] Other (See Comments) 02/21/2013  . Diazepam Other (See Comments) 02/21/2013  . Diflunisal Swelling and Other (See Comments) 02/21/2013  . Duloxetine Other (See Comments) 02/21/2013  . Flexeril [cyclobenzaprine] Other (See Comments) 02/21/2013  . Lactose intolerance (gi) Swelling and Other (See Comments) 08/23/2014  . Lidocaine Other (See Comments) 02/21/2013  . Meperidine and related Other (See Comments) 02/21/2013  . Metanx [l-methylfolate-algae-b12-b6] Other (See Comments) 02/21/2013  . Metoclopramide Other (See Comments) 02/21/2013  . Morphine and related Other (See Comments) 02/21/2013  . Nuprin [ibuprofen] Other (See Comments) 02/21/2013  . Oxycodone Other (See Comments) 02/21/2013  . Penicillins Other (See Comments) 02/21/2013  . Propoxyphene Other (See Comments) 02/21/2013  . Ranitidine Other (See Comments) 02/21/2013  . Vicodin [hydrocodone-acetaminophen] Other (See Comments) 02/21/2013  . Tylenol [acetaminophen] Rash 02/21/2013    Family History  Problem Relation Age of Onset  . Heart attack Father   . Heart disease Paternal Uncle   . Heart disease Paternal Grandmother   . Diabetes Paternal Grandmother   . Heart disease Paternal Grandfather   . Breast cancer Mother   . Cancer Maternal Grandmother   . CVA Maternal Grandmother   . Cancer Maternal Grandfather   . CVA Maternal Grandfather   . Diabetes Sister   . Diabetes Brother   . Diabetes Brother   . Stroke  Daughter      Physical Exam: General:   Alert,  well-nourished, pleasant and cooperative in NAD. Sitting in a wheelchair.  Heart:  Regular rate and rhythm; no murmurs Pulm: Clear anteriorly; no wheezing Abdomen:  Soft. Central obesity. Nontender. Nondistended. Normal bowel sounds. No rebound or guarding. No fluid wave.  LAD: No inguinal or umbilical LAD Extremities:  Without edema. Neurologic:  Alert and  oriented x4;  grossly normal neurologically; no asterixis or clonus. Skin: No jaundice. Palmar erythema. Spider angioma on the chest wall.  Violaceous lesion on the right forearm.  Psych:  Alert and cooperative. Normal mood and affect.     Boston Catarino L. Tarri Glenn, MD, MPH 06/19/2020, 11:03 AM

## 2020-06-19 NOTE — Patient Instructions (Addendum)
If you are age 76 or older, your body mass index should be between 23-30. Your Body mass index is 31.01 kg/m. If this is out of the aforementioned range listed, please consider follow up with your Primary Care Provider.  You have been scheduled for a nurse visit on 07/03/20 at 10:00am to take the pill for the x-ray. 5 days after you take the pill please come back to the radiologly department here in our basement to have the x-ray done, it is walk in so you do not need an appointment.  For the MRI:  IMAGING:  . You will be contacted by [place] (Your caller ID will indicate phone # [phone]) in the next 2 days to schedule your MRI at [facility]. If you have not heard from them within 2 business days, please call [facility] at [phone] to follow up on the status of your appointment.    MEDICATION: We have sent the following medication to your pharmacy for you to pick up at your convenience: Famotidine 20 MG once a day.  Please follow up with Dr. Tarri Glenn in 4 months.  It was great seeing you today! Thank you for entrusting me with your care and choosing [facility].  Dr. Tarri Glenn

## 2020-06-19 NOTE — Progress Notes (Signed)
MRI RADIOLOGY SCHEDULING REQUEST SENT TO: [facility] - [place] Scheduling via secure staff message.

## 2020-06-20 ENCOUNTER — Inpatient Hospital Stay (HOSPITAL_BASED_OUTPATIENT_CLINIC_OR_DEPARTMENT_OTHER): Payer: Medicare Other | Admitting: Hematology and Oncology

## 2020-06-20 ENCOUNTER — Other Ambulatory Visit: Payer: Self-pay

## 2020-06-20 ENCOUNTER — Inpatient Hospital Stay: Payer: Medicare Other | Attending: Hematology and Oncology

## 2020-06-20 DIAGNOSIS — Z79899 Other long term (current) drug therapy: Secondary | ICD-10-CM | POA: Diagnosis not present

## 2020-06-20 DIAGNOSIS — D696 Thrombocytopenia, unspecified: Secondary | ICD-10-CM | POA: Diagnosis not present

## 2020-06-20 DIAGNOSIS — D649 Anemia, unspecified: Secondary | ICD-10-CM | POA: Insufficient documentation

## 2020-06-20 DIAGNOSIS — K754 Autoimmune hepatitis: Secondary | ICD-10-CM | POA: Insufficient documentation

## 2020-06-20 DIAGNOSIS — D702 Other drug-induced agranulocytosis: Secondary | ICD-10-CM

## 2020-06-20 DIAGNOSIS — T451X5A Adverse effect of antineoplastic and immunosuppressive drugs, initial encounter: Secondary | ICD-10-CM

## 2020-06-20 LAB — CMP (CANCER CENTER ONLY)
ALT: 19 U/L (ref 0–44)
AST: 31 U/L (ref 15–41)
Albumin: 2.8 g/dL — ABNORMAL LOW (ref 3.5–5.0)
Alkaline Phosphatase: 74 U/L (ref 38–126)
Anion gap: 7 (ref 5–15)
BUN: 18 mg/dL (ref 8–23)
CO2: 18 mmol/L — ABNORMAL LOW (ref 22–32)
Calcium: 8.1 mg/dL — ABNORMAL LOW (ref 8.9–10.3)
Chloride: 100 mmol/L (ref 98–111)
Creatinine: 1.68 mg/dL — ABNORMAL HIGH (ref 0.44–1.00)
GFR, Estimated: 32 mL/min — ABNORMAL LOW (ref >60)
Glucose, Bld: 109 mg/dL — ABNORMAL HIGH (ref 70–99)
Potassium: 4.5 mmol/L (ref 3.5–5.1)
Sodium: 125 mmol/L — ABNORMAL LOW (ref 135–145)
Total Bilirubin: 1 mg/dL (ref 0.3–1.2)
Total Protein: 6 g/dL — ABNORMAL LOW (ref 6.5–8.1)

## 2020-06-20 LAB — CBC WITH DIFFERENTIAL (CANCER CENTER ONLY)
Abs Immature Granulocytes: 0 10*3/uL (ref 0.00–0.07)
Basophils Absolute: 0 10*3/uL (ref 0.0–0.1)
Basophils Relative: 0 %
Eosinophils Absolute: 0 10*3/uL (ref 0.0–0.5)
Eosinophils Relative: 0 %
HCT: 26.2 % — ABNORMAL LOW (ref 36.0–46.0)
Hemoglobin: 9.5 g/dL — ABNORMAL LOW (ref 12.0–15.0)
Immature Granulocytes: 0 %
Lymphocytes Relative: 29 %
Lymphs Abs: 0.4 10*3/uL — ABNORMAL LOW (ref 0.7–4.0)
MCH: 32.3 pg (ref 26.0–34.0)
MCHC: 36.3 g/dL — ABNORMAL HIGH (ref 30.0–36.0)
MCV: 89.1 fL (ref 80.0–100.0)
Monocytes Absolute: 0.2 10*3/uL (ref 0.1–1.0)
Monocytes Relative: 14 %
Neutro Abs: 0.8 10*3/uL — ABNORMAL LOW (ref 1.7–7.7)
Neutrophils Relative %: 57 %
Platelet Count: 52 10*3/uL — ABNORMAL LOW (ref 150–400)
RBC: 2.94 MIL/uL — ABNORMAL LOW (ref 3.87–5.11)
RDW: 13.6 % (ref 11.5–15.5)
WBC Count: 1.4 10*3/uL — ABNORMAL LOW (ref 4.0–10.5)
nRBC: 0 % (ref 0.0–0.2)

## 2020-06-20 NOTE — Assessment & Plan Note (Signed)
02/21/2013: WBC 1.7 08/25/2014: WBC 1.4, platelets 81 05/16/2017: WBC 4.1, platelets 132 03/24/2019: WBC 4.4, pl 78 10/19/19 showed WBC 1.1, ANC 0.6, platelets 52, on 10/26/19 showed WBC 1.3, ANC 0.7, platelets 54, and on 11/02/19 showed WBC 1.2, ANC 0.6, platelets 54. 11/07/2019: WBC 1.1, platelets 50, ANC 0.5 05/15/20: WBC: WBC 1.8, Hb 11.5, Platelets: 58  History of autoimmune hepatitis on immunosuppressive treatments like azathioprine and Prograf Recommendation: Adjust the dosage of immunosuppressive treatments to be reduced to maintain the absolute neutrophil count above 0.8.  Thrombocytopenia: It could be autoimmune thrombocytopenia or it could be related to the cirrhosis with splenomegaly or it could be related to the immunosuppressive drugs as well.  She does not have any bruising or bleeding symptoms and her platelets are above 50.

## 2020-06-20 NOTE — Progress Notes (Signed)
Patient Care Team: Libby Maw, MD as PCP - General (Family Medicine) [name], MD as PCP - Electrophysiology (Cardiology) [name], MD as Consulting Physician (Cardiology) Michael Boston, MD as Consulting Physician (General Surgery) Thornton Park, MD as Consulting Physician (Gastroenterology)  DIAGNOSIS:  Encounter Diagnosis  Name Primary?  . Other drug-induced neutropenia ([place])      CHIEF COMPLIANT: Follow-up of leukopenia  INTERVAL HISTORY: Stacey Velazquez is a 75 year old above-mentioned history of leukopenia related to autoimmune disease and azathioprine.  She is here for lab check and follow-up.  She had blood work done on 05/15/2020 which showed a white count of 1.8 with an ANC of 1.1 and hemoglobin of 11.5 and platelets of 58.  Therefore she appears to be holding steady with her blood work in spite of current treatment with azathioprine.  Our goal was to keep her about 0.8 on the [place].  She does not report any recent infections or illnesses.   ALLERGIES:  is allergic to neomycin, ambien [zolpidem tartrate], amitriptyline, ampicillin, anaprox [naproxen sodium], benadryl [diphenhydramine hcl (sleep)], cetirizine & related, cortizone-10 [hydrocortisone], darvon [propoxyphene], diazepam, diflunisal, duloxetine, flexeril [cyclobenzaprine], lactose intolerance (gi), lidocaine, meperidine and related, metanx [l-methylfolate-algae-b12-b6], metoclopramide, morphine and related, nuprin [ibuprofen], oxycodone, penicillins, propoxyphene, ranitidine, vicodin [hydrocodone-acetaminophen], and tylenol [acetaminophen].  MEDICATIONS:  Current Outpatient Medications  Medication Sig Dispense Refill  . carvedilol (COREG) 6.25 MG tablet TAKE 1 TABLET (6.25 MG TOTAL) BY MOUTH DAILY. 30 tablet 2  . dexlansoprazole (DEXILANT) 60 MG capsule Take 1 capsule (60 mg total) by mouth daily. 90 capsule 3  . famotidine (PEPCID) 20 MG tablet Take 1 tablet (20 mg total) by mouth at bedtime.  90 tablet 0  . levothyroxine (SYNTHROID) 75 MCG tablet TAKE 1 TABLET (75 MCG TOTAL) BY MOUTH DAILY BEFORE BREAKFAST. 90 tablet 1  . Probiotic Product (DIGESTIVE ADV DIGESTIVE/IMMUNE) CHEW Chew 2 capsules by mouth at bedtime.    Marland Kitchen spironolactone (ALDACTONE) 25 MG tablet TAKE 1/2 TABLET (12.5 MG TOTAL) BY MOUTH DAILY. 45 tablet 0  . tacrolimus (PROGRAF) 1 MG capsule TAKE 3 CAPSULES (3 MG TOTAL) BY MOUTH DAILY. 90 capsule 3  . tamsulosin (FLOMAX) 0.4 MG CAPS capsule Take 0.4 mg by mouth at bedtime.     No current facility-administered medications for this visit.    PHYSICAL EXAMINATION: ECOG PERFORMANCE STATUS: 1 - Symptomatic but completely ambulatory  Vitals:   06/20/20 1116  BP: 119/73  Pulse: 66  Resp: 20  Temp: 97.6 F (36.4 C)  SpO2: 100%   Filed Weights       LABORATORY DATA:  I have reviewed the data as listed CMP Latest Ref Rng & Units 06/20/2020 05/08/2020 04/24/2020  Glucose 70 - 99 mg/dL 109(H) - -  BUN 8 - 23 mg/dL 18 - -  Creatinine 0.44 - 1.00 mg/dL 1.68(H) - -  Sodium 135 - 145 mmol/L 125(L) - -  Potassium 3.5 - 5.1 mmol/L 4.5 - -  Chloride 98 - 111 mmol/L 100 - -  CO2 22 - 32 mmol/L 18(L) - -  Calcium 8.9 - 10.3 mg/dL 8.1(L) - -  Total Protein 6.5 - 8.1 g/dL 6.0(L) 6.1 6.4  Total Bilirubin 0.3 - 1.2 mg/dL 1.0 1.0 1.1  Alkaline Phos 38 - 126 U/L 74 66 71  AST 15 - 41 U/L 31 24 24   ALT 0 - 44 U/L 19 18 17     Lab Results  Component Value Date   WBC 1.4 (L) 06/20/2020   HGB  9.5 (L) 06/20/2020   HCT 26.2 (L) 06/20/2020   MCV 89.1 06/20/2020   PLT 52 (L) 06/20/2020   NEUTROABS 0.8 (L) 06/20/2020    ASSESSMENT & PLAN:   leukopenia ([place]) 02/21/2013: WBC 1.7 08/25/2014: WBC 1.4, platelets 81 05/16/2017: WBC 4.1, platelets 132 03/24/2019: WBC 4.4, pl 78 10/19/19 showed WBC 1.1, ANC 0.6, platelets 52, on 10/26/19 showed WBC 1.3, ANC 0.7, platelets 54, and on 11/02/19 showed WBC 1.2, ANC 0.6, platelets 54. 11/07/2019: WBC 1.1, platelets 50, ANC 0.5 05/15/20: WBC:  WBC 1.8, Hb 11.5, Platelets: 58 06/20/2020: WBC 1.4, ANC 0.8, hemoglobin 9.5, platelets 52  Counseling: I discussed with her that her white blood cell count is 0.8 and therefore she can continue with the Prograf treatment.  Her platelets are also stable.  She is however more anemic.  She has had a lot of abdominal issues.  She will discuss with gastroenterology about anemia work-ups.  History of autoimmune hepatitis on immunosuppressive treatments like azathioprine and Prograf Recommendation: Adjust the dosage of immunosuppressive treatments to be reduced to maintain the absolute neutrophil count above 0.8.  Thrombocytopenia: It could be autoimmune thrombocytopenia or it could be related to the cirrhosis with splenomegaly or it could be related to the immunosuppressive drugs as well.  She does not have any bruising or bleeding symptoms and her platelets are above 50.    Return to clinic in 6 months with labs and follow-up  Orders Placed This Encounter  Procedures  . CBC with Differential (Cancer Center Only)    Standing Status:   Future    Standing Expiration Date:   06/20/2021   The patient has a good understanding of the overall plan. she agrees with it. she will call with any problems that may develop before the next visit here. Total time spent: 30 mins including face to face time and time spent for planning, charting and co-ordination of care   [name], MD 06/20/20

## 2020-06-26 ENCOUNTER — Other Ambulatory Visit: Payer: Self-pay | Admitting: Family

## 2020-06-26 ENCOUNTER — Telehealth: Payer: Self-pay | Admitting: Hematology and Oncology

## 2020-06-26 DIAGNOSIS — E039 Hypothyroidism, unspecified: Secondary | ICD-10-CM

## 2020-06-26 NOTE — Telephone Encounter (Signed)
Per 5/27 los , pt aware

## 2020-07-08 ENCOUNTER — Other Ambulatory Visit: Payer: Self-pay

## 2020-07-08 ENCOUNTER — Ambulatory Visit (INDEPENDENT_AMBULATORY_CARE_PROVIDER_SITE_OTHER)
Admission: RE | Admit: 2020-07-08 | Discharge: 2020-07-08 | Disposition: A | Discharge status: Home / Self Care | Payer: Medicare Other | Source: Ambulatory Visit | Attending: Gastroenterology | Admitting: Gastroenterology

## 2020-07-08 DIAGNOSIS — K5900 Constipation, unspecified: Secondary | ICD-10-CM

## 2020-07-08 DIAGNOSIS — K754 Autoimmune hepatitis: Secondary | ICD-10-CM | POA: Diagnosis not present

## 2020-07-08 DIAGNOSIS — K7469 Other cirrhosis of liver: Secondary | ICD-10-CM | POA: Diagnosis not present

## 2020-07-08 DIAGNOSIS — D709 Neutropenia, unspecified: Secondary | ICD-10-CM

## 2020-07-08 DIAGNOSIS — R748 Abnormal levels of other serum enzymes: Secondary | ICD-10-CM | POA: Diagnosis not present

## 2020-07-08 IMAGING — DX DG ABDOMEN 1V
2 series · 2 of 2 positions shown · non-contrast
Comparison: [DATE]

CLINICAL DATA: Chronic constipation

EXAM:
ABDOMEN - 1 VIEW

[abdomen kub (1 of 2)]
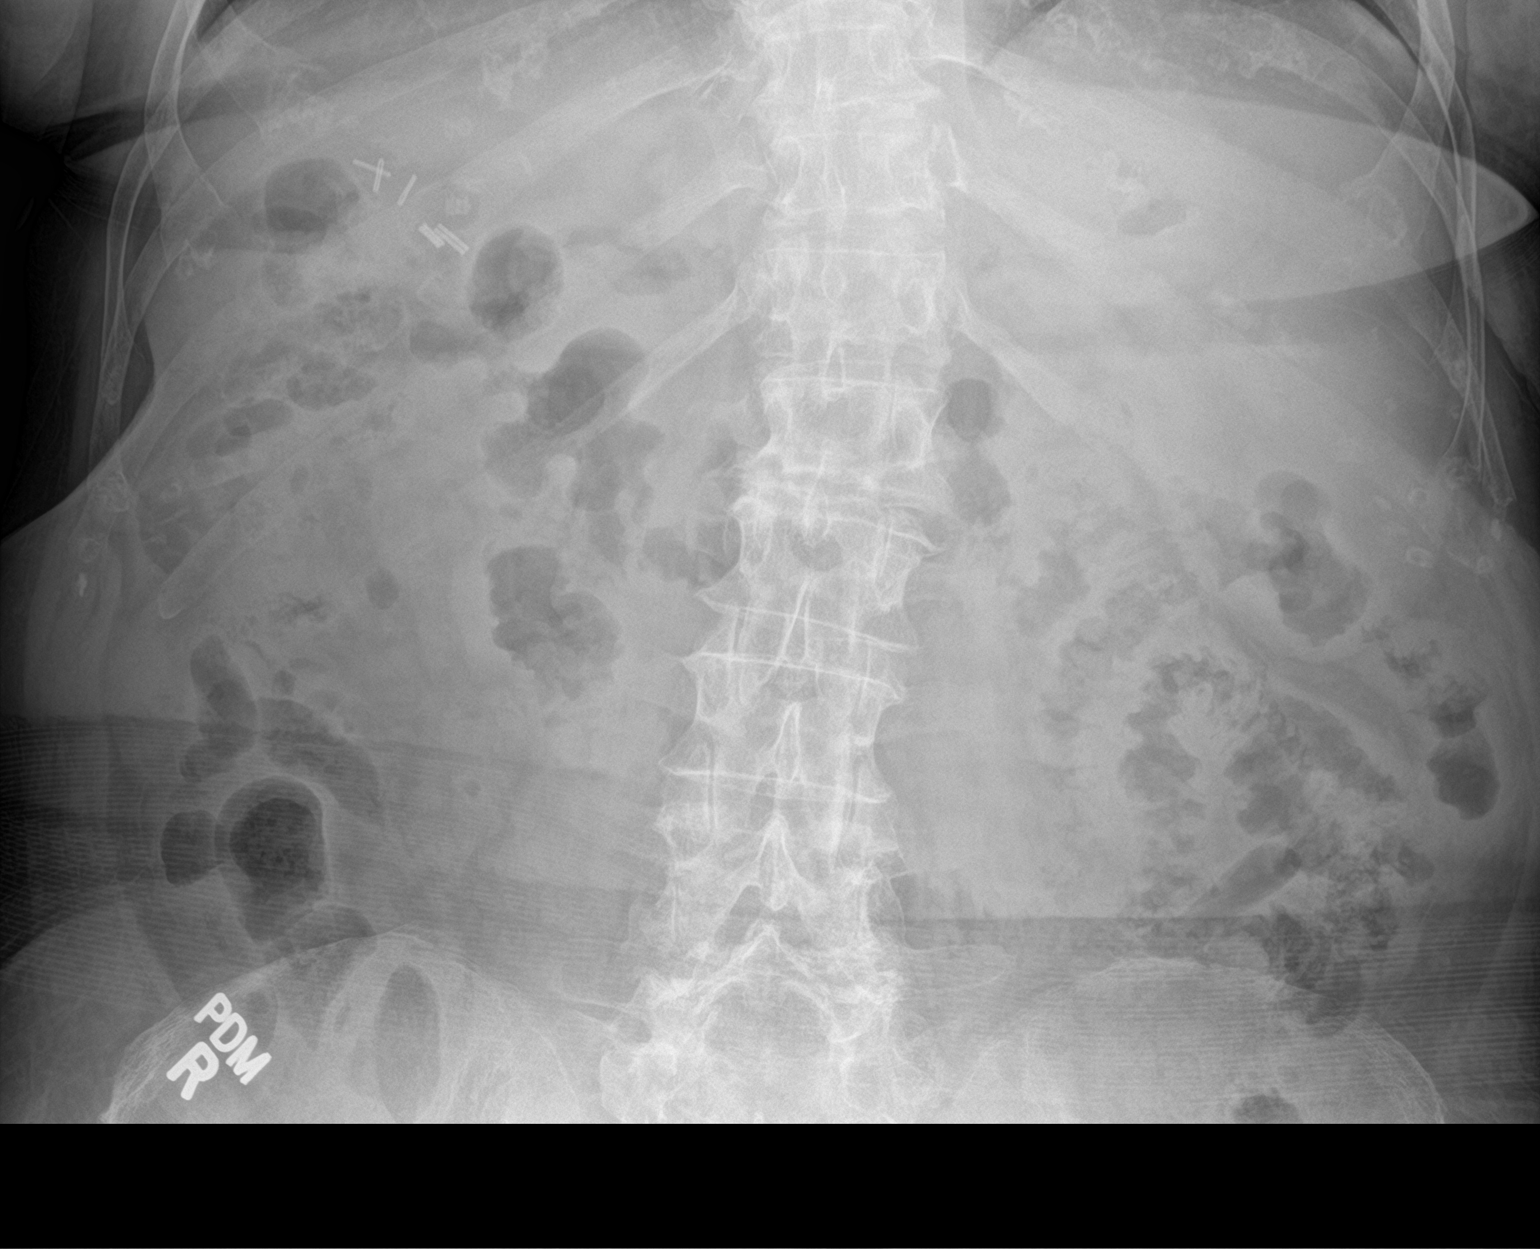

[abdomen kub (2 of 2)]
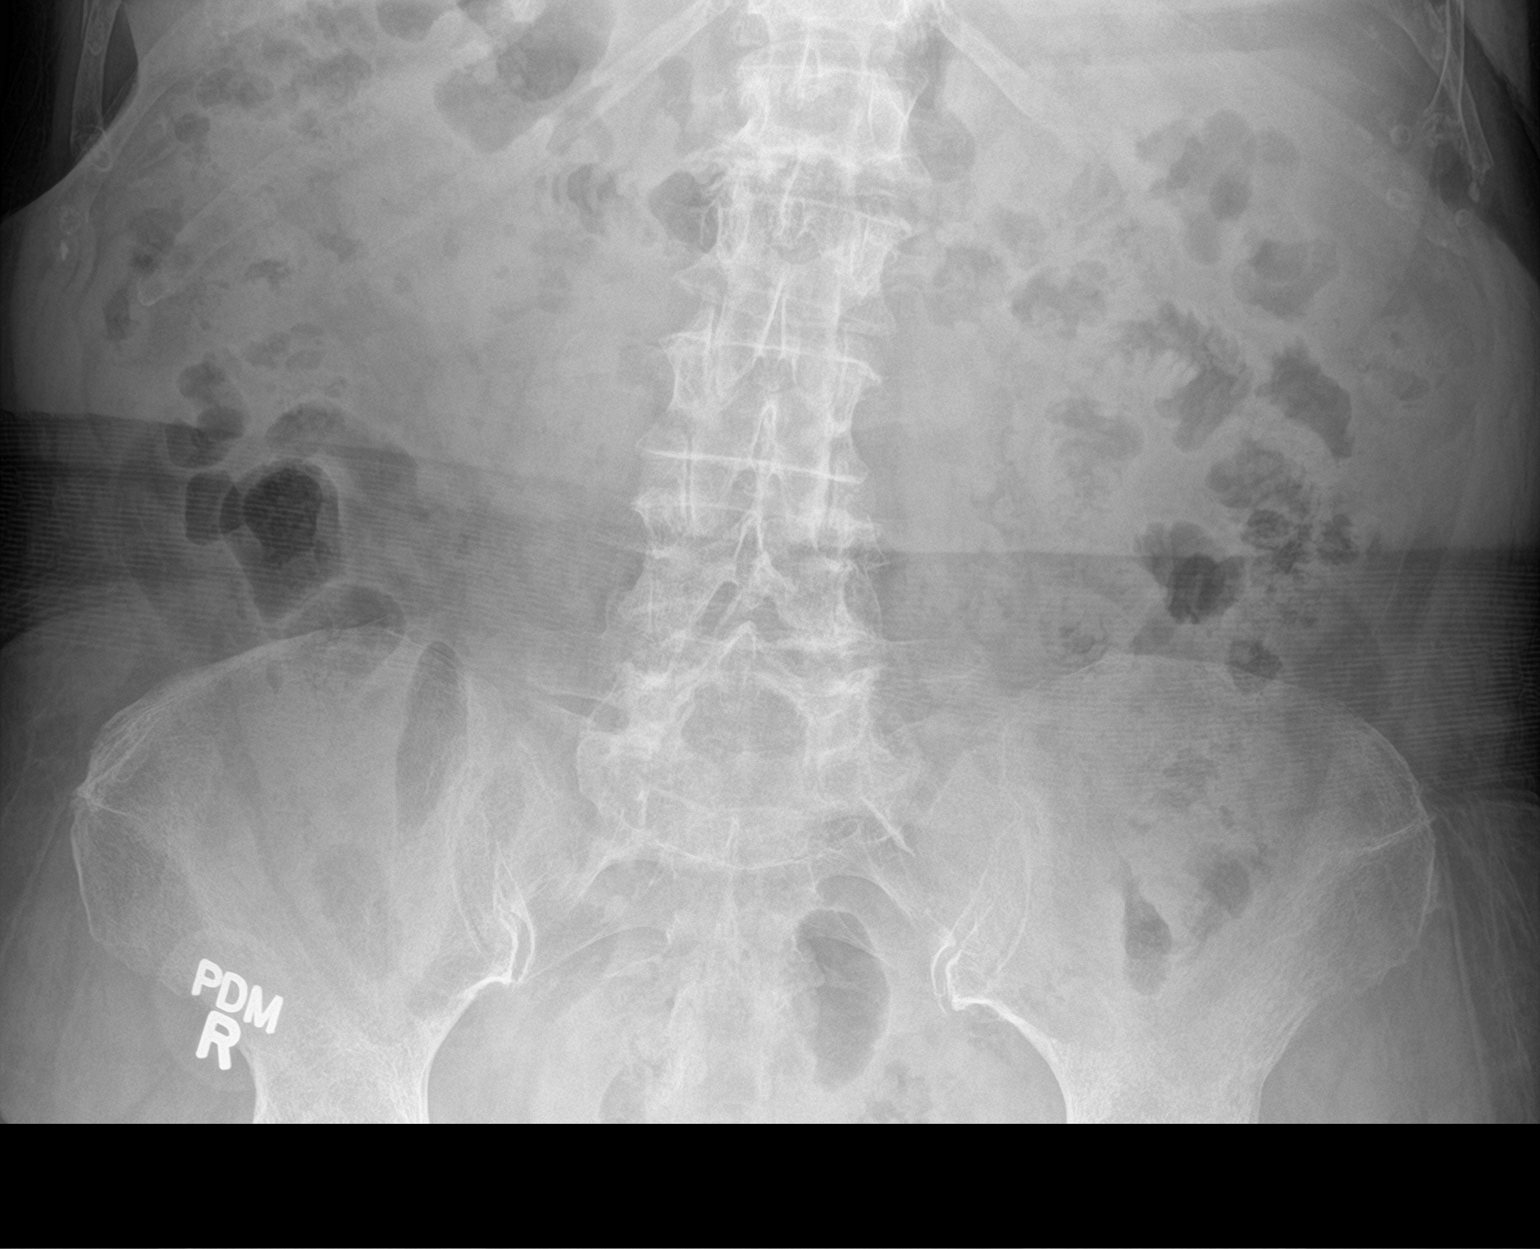

[2 of 2 positions shown; findings below may reference images not displayed]

FINDINGS: Supine frontal views of the abdomen and pelvis are obtained,
excluding the hemidiaphragms and lower pelvis by collimation.
Cholecystectomy clips right upper quadrant, with evidence of a
dropped clip within the pelvis. Bowel gas pattern is unremarkable.
No evidence of retained Sitz markers within the visualized portions
of the abdomen and pelvis.
IMPRESSION: 1. Unremarkable bowel gas pattern. No evidence of retained Sitz
markers.

## 2020-07-09 ENCOUNTER — Other Ambulatory Visit: Payer: Self-pay

## 2020-07-09 DIAGNOSIS — M6289 Other specified disorders of muscle: Secondary | ICD-10-CM

## 2020-07-09 DIAGNOSIS — K5900 Constipation, unspecified: Secondary | ICD-10-CM

## 2020-07-21 ENCOUNTER — Telehealth: Payer: Self-pay | Admitting: Gastroenterology

## 2020-07-21 NOTE — Telephone Encounter (Signed)
Spoke with patient, she states that she was having a hard time getting in contact with [facility]. They have provided her with a lot of information but are unable to get her in until the end of August. Patient would like to know if this is an OK timeframe? Advised patient that it would be best to get scheduled for an appt with Earlie Counts and get added to the cancellation list. Patient also wanted to know if there is another company or office that offers pelvic floor PT. Please advise, thanks.

## 2020-07-22 NOTE — Telephone Encounter (Signed)
I will talk to my partners to see if there are any alternatives.

## 2020-07-24 NOTE — Telephone Encounter (Signed)
I populated an order for [facility] physical therapy or there is [facility] therapy.

## 2020-07-24 NOTE — Telephone Encounter (Signed)
Stacey Velazquez, would you please share the information that you shared with me earlier today about alternative to Stacey Velazquez. Many thanks.

## 2020-07-29 NOTE — Telephone Encounter (Signed)
Attempted to reach patient twice, the line rings for several minutes then a fast busy signal. Will attempt at a later time.

## 2020-07-31 NOTE — Telephone Encounter (Signed)
Attempted to reach patient, phone rings for several minutes with no answer. Will attempt again.

## 2020-08-04 NOTE — Telephone Encounter (Signed)
3rd attempt to reach patient. Phone rings continuously for several minutes. There is no prompt for vm. Unable to leave a message at this time.

## 2020-08-05 NOTE — Telephone Encounter (Signed)
No return call received.  

## 2020-08-11 ENCOUNTER — Other Ambulatory Visit: Payer: Self-pay | Admitting: Gastroenterology

## 2020-08-11 DIAGNOSIS — D709 Neutropenia, unspecified: Secondary | ICD-10-CM

## 2020-08-11 DIAGNOSIS — K754 Autoimmune hepatitis: Secondary | ICD-10-CM

## 2020-08-11 DIAGNOSIS — K7469 Other cirrhosis of liver: Secondary | ICD-10-CM

## 2020-08-11 DIAGNOSIS — K862 Cyst of pancreas: Secondary | ICD-10-CM

## 2020-08-12 NOTE — Telephone Encounter (Signed)
Spoke with patient in regards to information below. Patient states that she already sees a provider at [facility] urology and would like to call over there and see if they can get her scheduled for the therapy. Patient had no other concerns at the end of the call.

## 2020-08-12 NOTE — Telephone Encounter (Signed)
Patient returned call and states that she would like to proceed with referral to [facility]. Patient is aware that [facility] will contact her directly to set up an appt. Patient verbalized understanding and had no concerns at the end of the call.   Referral, records, demographic and insurance information faxed to [facility].

## 2020-09-01 DIAGNOSIS — M6289 Other specified disorders of muscle: Secondary | ICD-10-CM | POA: Diagnosis not present

## 2020-09-01 DIAGNOSIS — K5900 Constipation, unspecified: Secondary | ICD-10-CM | POA: Diagnosis not present

## 2020-09-01 DIAGNOSIS — M62838 Other muscle spasm: Secondary | ICD-10-CM | POA: Diagnosis not present

## 2020-09-01 DIAGNOSIS — R3911 Hesitancy of micturition: Secondary | ICD-10-CM | POA: Diagnosis not present

## 2020-09-01 DIAGNOSIS — R350 Frequency of micturition: Secondary | ICD-10-CM | POA: Diagnosis not present

## 2020-09-01 DIAGNOSIS — M6281 Muscle weakness (generalized): Secondary | ICD-10-CM | POA: Diagnosis not present

## 2020-09-02 ENCOUNTER — Other Ambulatory Visit: Payer: Self-pay | Admitting: Family Medicine

## 2020-09-02 DIAGNOSIS — R600 Localized edema: Secondary | ICD-10-CM

## 2020-09-08 ENCOUNTER — Ambulatory Visit (HOSPITAL_COMMUNITY)
Admission: RE | Admit: 2020-09-08 | Discharge: 2020-09-08 | Disposition: A | Discharge status: Home / Self Care | Payer: Medicare Other | Source: Ambulatory Visit | Attending: Gastroenterology | Admitting: Gastroenterology

## 2020-09-08 ENCOUNTER — Other Ambulatory Visit: Payer: Self-pay

## 2020-09-08 DIAGNOSIS — K7460 Unspecified cirrhosis of liver: Secondary | ICD-10-CM | POA: Diagnosis not present

## 2020-09-08 DIAGNOSIS — R748 Abnormal levels of other serum enzymes: Secondary | ICD-10-CM

## 2020-09-08 DIAGNOSIS — K754 Autoimmune hepatitis: Secondary | ICD-10-CM | POA: Diagnosis not present

## 2020-09-08 DIAGNOSIS — R161 Splenomegaly, not elsewhere classified: Secondary | ICD-10-CM | POA: Diagnosis not present

## 2020-09-08 DIAGNOSIS — N281 Cyst of kidney, acquired: Secondary | ICD-10-CM | POA: Diagnosis not present

## 2020-09-08 DIAGNOSIS — K7469 Other cirrhosis of liver: Secondary | ICD-10-CM | POA: Diagnosis not present

## 2020-09-08 DIAGNOSIS — K5900 Constipation, unspecified: Secondary | ICD-10-CM | POA: Diagnosis not present

## 2020-09-08 DIAGNOSIS — D709 Neutropenia, unspecified: Secondary | ICD-10-CM

## 2020-09-08 DIAGNOSIS — K862 Cyst of pancreas: Secondary | ICD-10-CM | POA: Diagnosis not present

## 2020-09-08 IMAGING — MR MR ABDOMEN WO/W CM
22 of 23 series · 44 of 48 positions shown · IV contrast (gadavist)
Comparison: CT abdomen/pelvis dated [DATE]. MRI abdomen dated
[DATE].
COMPARISON: CT abdomen/pelvis dated [DATE]. MRI abdomen dated
[DATE].

Addendum:
CLINICAL DATA: Cirrhosis, follow-up pancreatic cyst

EXAM:
MRI ABDOMEN WITHOUT AND WITH CONTRAST
TECHNIQUE: Multiplanar multisequence MR imaging of the abdomen was performed
both before and after the administration of intravenous contrast.
CONTRAST:  8mL GADAVIST GADOBUTROL 1 MMOL/ML IV SOLN

[Series 4: cor haste · coronal · 6.0mm · 1.19mm/px · 1 of 34 slices shown]
[im 1/34]
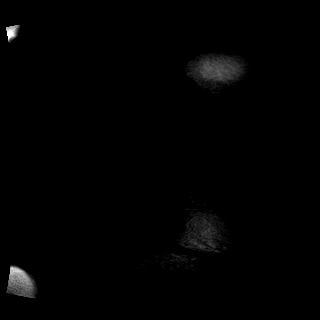

[Series 5: ax haste · axial · 6.0mm · 1.25mm/px · 1 of 30 slices shown]
[im 1/30]
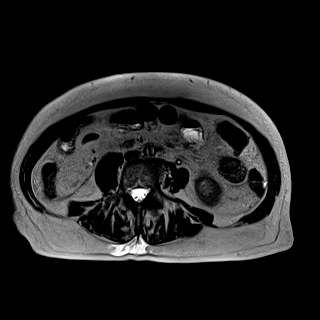

[Series 6: T2 fat-sat · 0.50mm/px · 1 of 6 slices shown (1 of 5)]
[im 1/6]
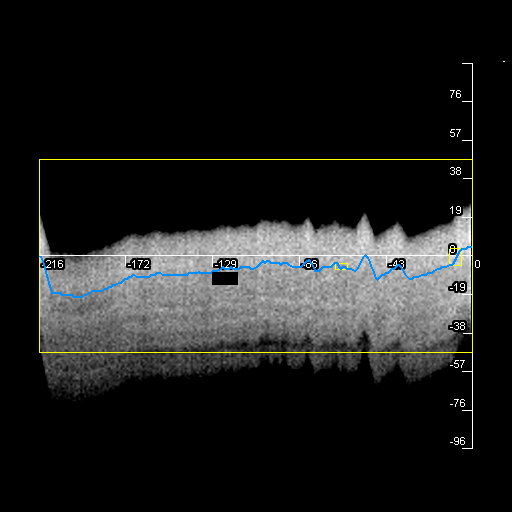

[Series 8: T2 fat-sat · axial · 6.0mm · 1.19mm/px · 1 of 5 slices shown (2 of 5)]
[im 1/5]
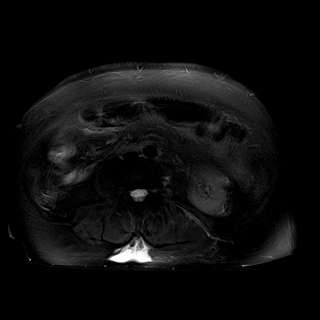

[Series 9: T2 fat-sat · axial · 6.0mm · 1.19mm/px · 1 of 30 slices shown (3 of 5)]
[im 1/30]
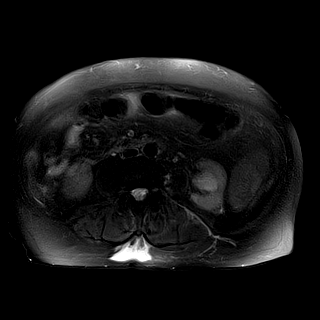

[Series 10: t1_vibe_opp-in_tra_p4_bh · axial · 3.0mm · 1.19mm/px · z∈[-142,+71]mm · 2 of 72 slices shown (1 of 2)]
[im 1/72]
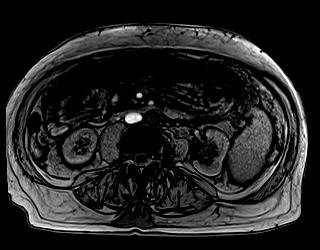
[im 72/72]
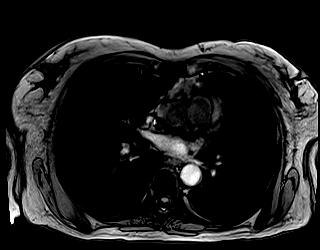

[Series 10: t1_vibe_opp-in_tra_p4_bh · axial · 3.0mm · 1.19mm/px · z∈[-142,+71]mm · 2 of 72 slices shown (2 of 2)]
[im 1/72]
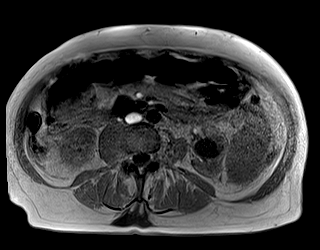
[im 72/72]
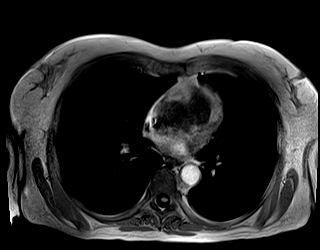

[Series 11: T2 fat-sat · 0.50mm/px · 1 of 3 slices shown (4 of 5)]
[im 1/3]
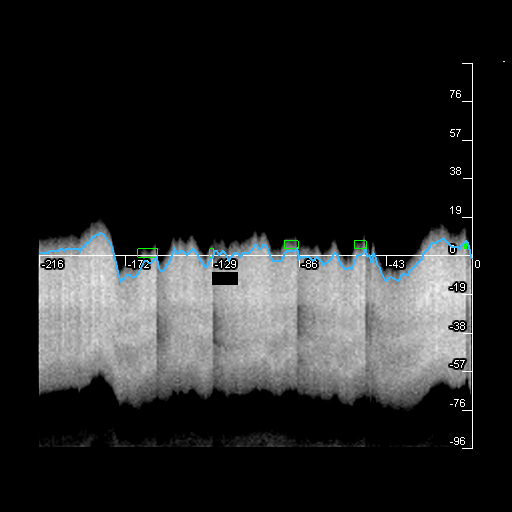

[Series 13: T2 fat-sat · axial · 6.0mm · 1.19mm/px · 1 of 30 slices shown (5 of 5)]
[im 1/30]
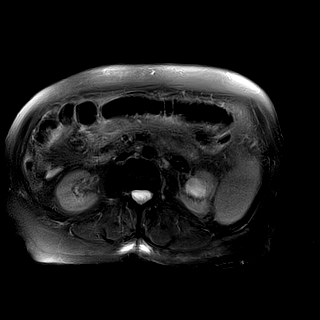

[Series 14: DWI · axial · 6.0mm · 1.49mm/px · z∈[-143,+66]mm · 3 of 90 slices shown (1 of 2)]
[im 1/90]
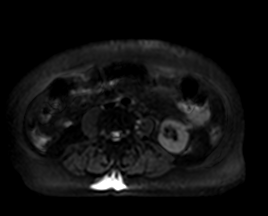
[im 45/90]
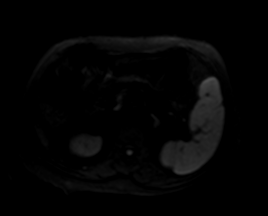
[im 90/90]
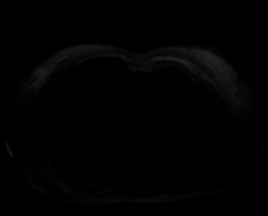

[Series 15: DWI · axial · 6.0mm · 1.49mm/px · 1 of 30 slices shown (2 of 2)]
[im 1/30]
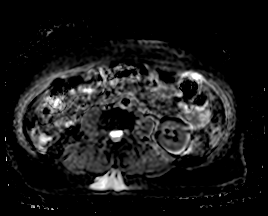

[Series 16: bSSFP · axial · 6.0mm · 0.78mm/px · 1 of 30 slices shown]
[im 1/30]
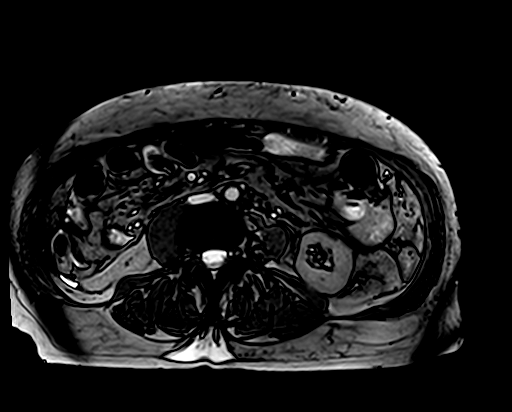

[Series 17: t1_vibe_fs_tra_p4_bh_pre · axial · 3.0mm · 1.19mm/px · z∈[-125,+88]mm · 3 of 72 slices shown]
[im 1/72]
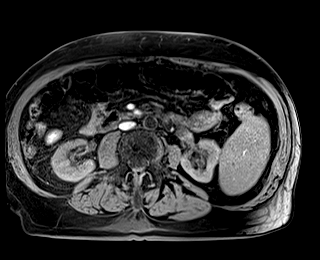
[im 36/72]
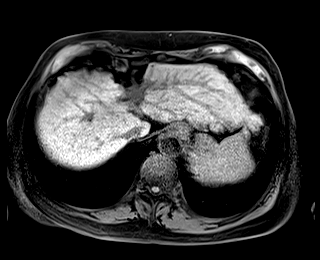
[im 72/72]
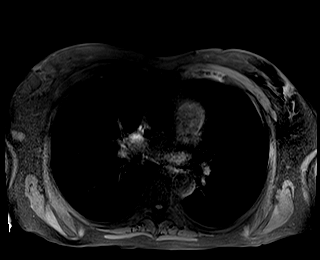

[Series 19: t1_vibe_fs_tra_p4_bh_post · axial · 3.0mm · 1.19mm/px · z∈[-125,+88]mm · 3 of 72 slices shown (1 of 4)]
[im 1/72]
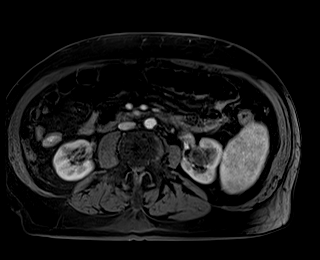
[im 36/72]
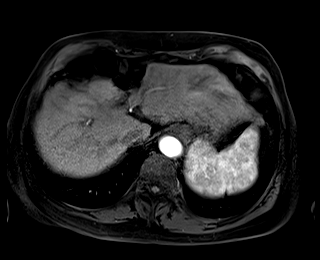
[im 72/72]
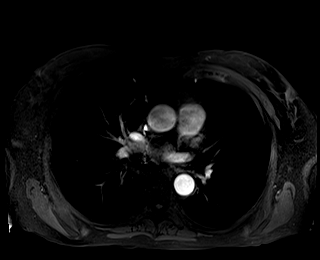

[Series 20: t1_vibe_fs_tra_p4_bh_post_sub · axial · 3.0mm · 1.19mm/px · z∈[-125,+88]mm · 3 of 72 slices shown (1 of 4)]
[im 1/72]
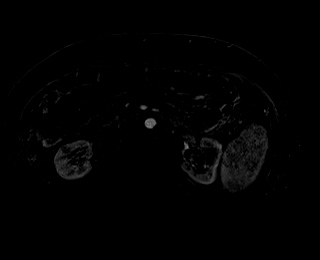
[im 36/72]
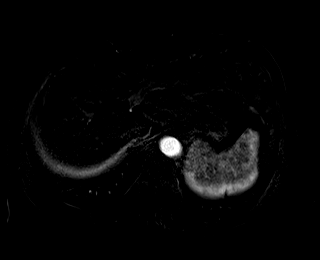
[im 72/72]
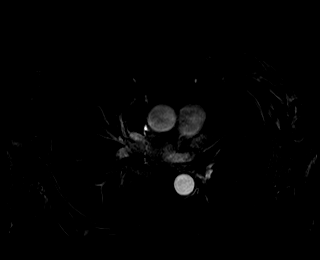

[Series 21: t1_vibe_fs_tra_p4_bh_post · axial · 3.0mm · 1.19mm/px · z∈[-125,+88]mm · 3 of 72 slices shown (2 of 4)]
[im 1/72]
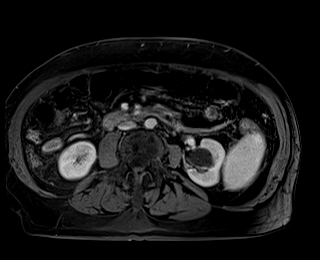
[im 36/72]
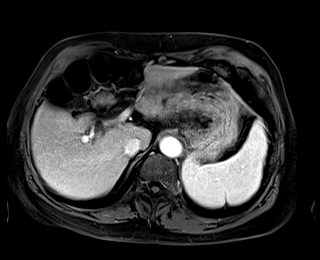
[im 72/72]
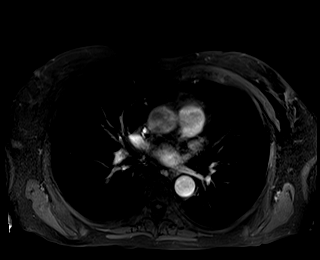

[Series 22: t1_vibe_fs_tra_p4_bh_post_sub · axial · 3.0mm · 1.19mm/px · z∈[-125,+88]mm · 3 of 72 slices shown (2 of 4)]
[im 1/72]
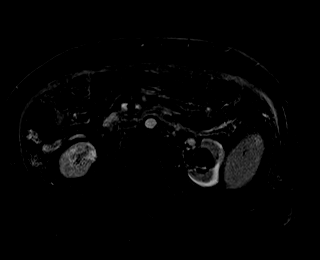
[im 36/72]
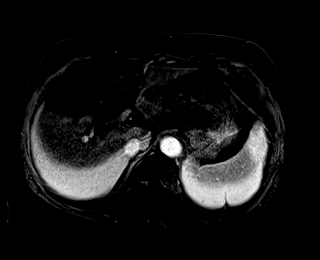
[im 72/72]
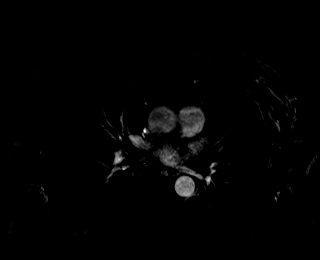

[Series 23: t1_vibe_fs_tra_p4_bh_post · axial · 3.0mm · 1.19mm/px · z∈[-125,+88]mm · 3 of 72 slices shown (3 of 4)]
[im 1/72]
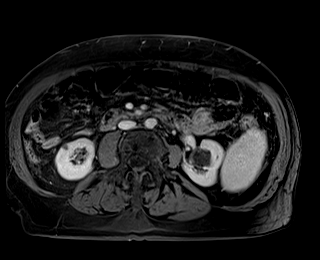
[im 36/72]
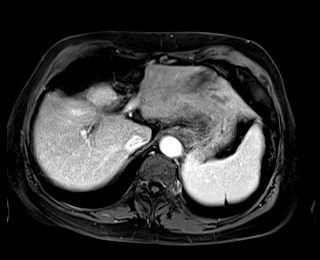
[im 72/72]
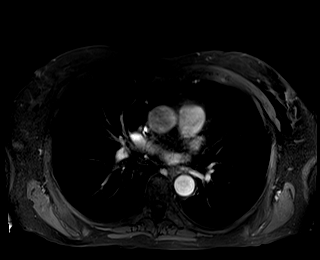

[Series 24: t1_vibe_fs_tra_p4_bh_post_sub · axial · 3.0mm · 1.19mm/px · z∈[-125,+88]mm · 3 of 72 slices shown (3 of 4)]
[im 1/72]
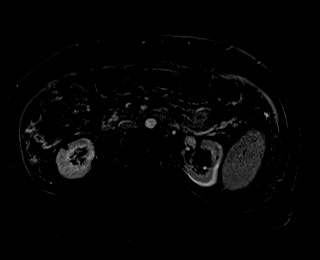
[im 36/72]
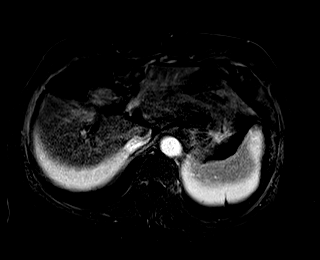
[im 72/72]
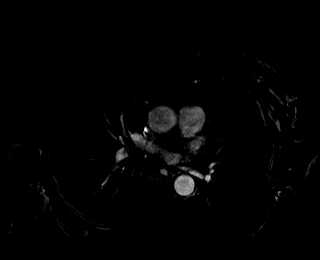

[Series 25: t1_vibe_fs_tra_p4_bh_post · axial · 3.0mm · 1.19mm/px · z∈[-125,+88]mm · 3 of 72 slices shown (4 of 4)]
[im 1/72]
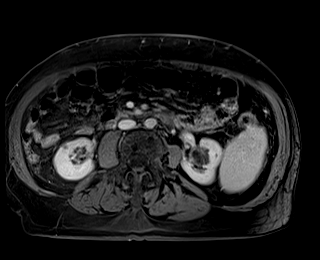
[im 36/72]
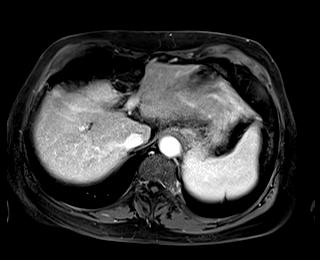
[im 72/72]
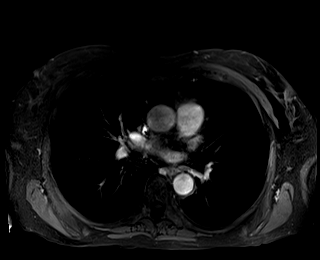

[Series 26: t1_vibe_fs_tra_p4_bh_post_sub · axial · 3.0mm · 1.19mm/px · z∈[-125,+88]mm · 3 of 72 slices shown (4 of 4)]
[im 1/72]
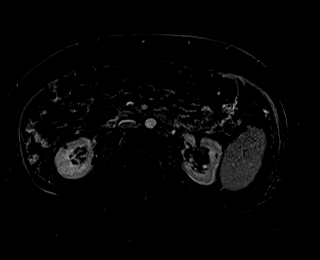
[im 36/72]
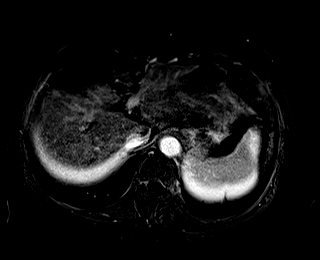
[im 72/72]
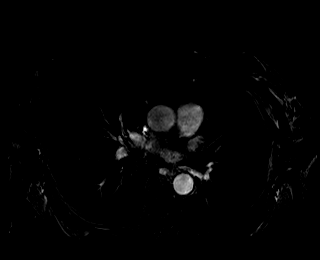

[Series 27: T1 dynamic post-contrast · coronal · 3.0mm · 1.31mm/px · 1 of 72 slices shown]
[im 1/72]
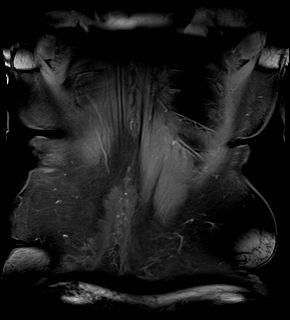

[44 of 48 positions shown; findings below may reference images not displayed]

FINDINGS: Motion degraded images.

Lower chest: Lung bases are clear.

Hepatobiliary: Cirrhosis.  No suspicious/enhancing hepatic lesions.

Status post cholecystectomy. No intrahepatic or extrahepatic ductal
dilatation.

Pancreas: 10 mm unilocular cyst along the pancreatic tail (series
5/image 15), without enhancement, likely reflecting a benign side
branch IPMN or pseudocyst.

Spleen: Splenomegaly, measuring 15.4 cm in maximal craniocaudal
dimension.

Adrenals/Urinary Tract:  Adrenal glands are within normal limits.

3.1 cm left renal sinus cysts. 1.5 cm lateral interpolar left renal
cyst with layering hemorrhage. Additional subcentimeter bilateral
renal cysts. No hydronephrosis.

Stomach/Bowel: Stomach and visualized bowel are grossly
unremarkable.

Vascular/Lymphatic:  No evidence of abdominal aortic aneurysm.

Portal vein is patent. Recanalized periumbilical vein. Splenorenal
shunt.

Other:  No abdominal ascites.

Musculoskeletal: No focal osseous lesions.
IMPRESSION: Cirrhosis.  No findings suspicious for HCC.

Portal vein is patent. Recanalized periumbilical vein and
splenorenal shunt. Splenomegaly. No abdominal ascites.

10 mm unilocular cyst along the pancreatic tail, likely reflecting a
benign side branch IPMN or pseudocyst.

ADDENDUM:
Addendum for size comparison of nonaggressive pancreatic tail cystic
lesion described in prior report. On prior MRI dated [DATE], the
pancreatic cystic lesion measured 10 mm when remeasured in a similar
fashion, and is unchanged. This remains likely benign. Given the
patient's age and comorbidities, no dedicated follow-up imaging
suggestion has been made due to the small size of the lesion.

*** End of Addendum ***
FINDINGS: Motion degraded images.

Lower chest: Lung bases are clear.

Hepatobiliary: Cirrhosis.  No suspicious/enhancing hepatic lesions.

Status post cholecystectomy. No intrahepatic or extrahepatic ductal
dilatation.

Pancreas: 10 mm unilocular cyst along the pancreatic tail (series
5/image 15), without enhancement, likely reflecting a benign side
branch IPMN or pseudocyst.

Spleen: Splenomegaly, measuring 15.4 cm in maximal craniocaudal
dimension.

Adrenals/Urinary Tract:  Adrenal glands are within normal limits.

3.1 cm left renal sinus cysts. 1.5 cm lateral interpolar left renal
cyst with layering hemorrhage. Additional subcentimeter bilateral
renal cysts. No hydronephrosis.

Stomach/Bowel: Stomach and visualized bowel are grossly
unremarkable.

Vascular/Lymphatic:  No evidence of abdominal aortic aneurysm.

Portal vein is patent. Recanalized periumbilical vein. Splenorenal
shunt.

Other:  No abdominal ascites.

Musculoskeletal: No focal osseous lesions.
IMPRESSION: Cirrhosis.  No findings suspicious for HCC.

Portal vein is patent. Recanalized periumbilical vein and
splenorenal shunt. Splenomegaly. No abdominal ascites.

10 mm unilocular cyst along the pancreatic tail, likely reflecting a
benign side branch IPMN or pseudocyst.

## 2020-09-08 MED ORDER — GADOBUTROL 1 MMOL/ML IV SOLN
8.0000 mL | Freq: Once | INTRAVENOUS | Status: AC | PRN
  Administered 2020-09-08: 8 mL via INTRAVENOUS

## 2020-09-08 NOTE — Progress Notes (Signed)
Changed device settings for MRI to  DOO at 80 bpm  Will program device back to pre-MRI settings after completion of exam, and send transmission.

## 2020-09-11 ENCOUNTER — Ambulatory Visit (INDEPENDENT_AMBULATORY_CARE_PROVIDER_SITE_OTHER): Payer: Medicare Other | Admitting: Family Medicine

## 2020-09-11 ENCOUNTER — Encounter: Payer: Self-pay | Admitting: Family Medicine

## 2020-09-11 ENCOUNTER — Other Ambulatory Visit: Payer: Self-pay

## 2020-09-11 VITALS — BP 104/62 | HR 82 | Temp 97.5°F | Ht 69.0 in

## 2020-09-11 DIAGNOSIS — H9193 Unspecified hearing loss, bilateral: Secondary | ICD-10-CM | POA: Diagnosis not present

## 2020-09-11 DIAGNOSIS — H6090 Unspecified otitis externa, unspecified ear: Secondary | ICD-10-CM | POA: Insufficient documentation

## 2020-09-11 DIAGNOSIS — H60393 Other infective otitis externa, bilateral: Secondary | ICD-10-CM | POA: Diagnosis not present

## 2020-09-11 MED ORDER — OFLOXACIN 0.3 % OT SOLN: 5.0000 [drp] | Freq: Every day | OTIC | 0 refills | Status: AC

## 2020-09-11 NOTE — Progress Notes (Signed)
Established Patient Office Visit  Subjective:  Patient ID: Stacey Velazquez, female    DOB: [birth year]  Age: 75 y.o. MRN: [id number]  CC:  Chief Complaint  Patient presents with   Cerumen Impaction    Both ears feel clogged up x 1 month    HPI Stacey Velazquez presents for a 1 month history of loss of hearing with congestion. Hearing had been normal prior to this time.  She denies postnasal drip sneezing itchy watery eyes ears nose or throat.  Canals have been sore.  Denies new medicines.  Ongoing issues with abdominal bloating constipation difficulty with urination.  She is using MiraLAX and tamsulosin.  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  History of schizophrenia.  Past Medical History:  Diagnosis Date   Adenomatous polyp of ascending colon    Anxiety    Bilateral lower extremity edema    burning sensations   Bipolar 1 disorder (HCC)    Carpal tunnel syndrome    Cirrhosis of liver (HCC)    Cutaneous horn    Drug-induced parkinsonism (HCC)    patient unaware   Esophageal varices (HCC)    Fibromyalgia    patient denies   Gastric ulcer    GERD (gastroesophageal reflux disease)    Gingival abscess 08/26/2016   Glaucoma 2003   pt unaware   Heart attack ([place])    Heart disease    Hepatitis, autoimmune ([place]) 08/25/2014   History of iron deficiency anemia    Hypotension    Hypothyroidism    Migraines    Neutropenia (HCC)    Osteoporosis    Pacemaker Medtronic    MRI compatible   Paranoid schizophrenia ([place])    Polyarthritis    Polyosteoarthritis    Rectal mass    Seizures ([place])    last sz 08/21/14   Sinus arrest    Skin cancer    forehead   SSS (sick sinus syndrome) ([place])    Stroke ([place])    Thrombocytopenia ([place])    Vertigo    bvvp    Past Surgical History:  Procedure Laterality Date   BIOPSY  03/06/2019   Procedure: BIOPSY;  Surgeon: Thornton Park, MD;  Location: [facility];  Service: Gastroenterology;;  EGD and COLON   BIOPSY   04/15/2020   Procedure: BIOPSY;  Surgeon: Thornton Park, MD;  Location: [facility];  Service: Gastroenterology;;   CATARACT EXTRACTION Left 2008   pt unaware   CHOLECYSTECTOMY     COLONOSCOPY WITH PROPOFOL N/A 03/06/2019   Procedure: COLONOSCOPY WITH PROPOFOL;  Surgeon: Thornton Park, MD;  Location: [facility];  Service: Gastroenterology;  Laterality: N/A;   ESOPHAGOGASTRODUODENOSCOPY (EGD) WITH PROPOFOL N/A 03/06/2019   Procedure: ESOPHAGOGASTRODUODENOSCOPY (EGD) WITH PROPOFOL ;  Surgeon: Thornton Park, MD;  [facility]: [facility];  Service: Gastroenterology;  Laterality: N/A;   ESOPHAGOGASTRODUODENOSCOPY (EGD) WITH PROPOFOL N/A 04/15/2020   Procedure: ESOPHAGOGASTRODUODENOSCOPY (EGD) WITH PROPOFOL;  Surgeon: Thornton Park, MD;  Location: [facility];  Service: Gastroenterology;  Laterality: N/A;   KNEE SURGERY Left 1985   PACEMAKER INSERTION  2014   PARTIAL HYSTERECTOMY  1979   PARTIAL PROCTECTOMY BY TEM N/A 05/10/2019   Procedure: TEM PARTIAL PROCTECTOMY OF RECTAL MASS WITH EXCISION RIGHT PERINEAL SKIN MASS;  Surgeon: Michael Boston, MD;  Location: [facility];  Service: General;  Laterality: N/A;   POLYPECTOMY  03/06/2019   Procedure: POLYPECTOMY;  Surgeon: Thornton Park, MD;  Location: [facility];  Service: Gastroenterology;;   REFRACTIVE SURGERY  TUBAL LIGATION      Family History  Problem Relation Age of Onset   Heart attack Father    Heart disease Paternal Uncle    Heart disease Paternal Grandmother    Diabetes Paternal Grandmother    Heart disease Paternal Grandfather    Breast cancer Mother    Cancer Maternal Grandmother    CVA Maternal Grandmother    Cancer Maternal Grandfather    CVA Maternal Grandfather    Diabetes Sister    Diabetes Brother    Diabetes Brother    Stroke Daughter     Social History   Socioeconomic History   Marital status: Widowed    Spouse name: Not on file   Number of children: Not on file   Years of education: Not on file    Highest education level: Not on file  Occupational History   Occupation: disable  Tobacco Use   Smoking status: Former    Types: Cigarettes    Quit date: 03/02/2003    Years since quitting: 17.5   Smokeless tobacco: Never  Vaping Use   Vaping Use: Never used  Substance and Sexual Activity   Alcohol use: No   Drug use: No   Sexual activity: Not on file  Other Topics Concern   Not on file  Social History Narrative   Lives alone, "have friends and relatives that come and help me"   caffeine use- coffee -1 cup daily   Social Determinants of Health   Financial Resource Strain: Not on file  Food Insecurity: Not on file  Transportation Needs: Not on file  Physical Activity: Not on file  Stress: Not on file  Social Connections: Not on file  Intimate Partner Violence: Not on file    Outpatient Medications Prior to Visit  Medication Sig Dispense Refill   carvedilol (COREG) 6.25 MG tablet TAKE 1 TABLET (6.25 MG TOTAL) BY MOUTH DAILY. 30 tablet 2   dexlansoprazole (DEXILANT) 60 MG capsule Take 1 capsule (60 mg total) by mouth daily. 90 capsule 3   famotidine (PEPCID) 20 MG tablet Take 1 tablet (20 mg total) by mouth at bedtime. 90 tablet 0   levothyroxine (SYNTHROID) 75 MCG tablet TAKE 1 TABLET (75 MCG TOTAL) BY MOUTH DAILY BEFORE BREAKFAST. 90 tablet 1   Probiotic Product (DIGESTIVE ADV DIGESTIVE/IMMUNE) CHEW Chew 2 capsules by mouth at bedtime.     spironolactone (ALDACTONE) 25 MG tablet TAKE 1/2 TABLET (12.5 MG TOTAL) BY MOUTH DAILY. 45 tablet 0   tacrolimus (PROGRAF) 1 MG capsule TAKE 3 CAPSULES (3 MG TOTAL) BY MOUTH DAILY. 90 capsule 3   tamsulosin (FLOMAX) 0.4 MG CAPS capsule Take 0.4 mg by mouth at bedtime.     No facility-administered medications prior to visit.    Allergies  Allergen Reactions   Neomycin Nausea And Vomiting   Ambien [Zolpidem Tartrate] Other (See Comments)    Hallucinations    Amitriptyline Other (See Comments)    Stops heart per pt   Ampicillin  Other (See Comments)    Stops heart per pt    Anaprox [Naproxen Sodium] Other (See Comments)    Doesn't work per pt   Benadryl [Diphenhydramine Hcl (Sleep)] Other (See Comments)    Rash   Cetirizine & Related Other (See Comments)    Swelling    Cortizone-10 [Hydrocortisone] Other (See Comments)    seizures    Darvon [Propoxyphene] Other (See Comments)    Increased heart rate per pt    Diazepam Other (See Comments)  Stops heart per pt   Diflunisal Swelling and Other (See Comments)    Stops heart per pt (Dolobid)   Duloxetine Other (See Comments)    Reaction to Cymbalta - pt doesn't remember what the reaction was   Flexeril [Cyclobenzaprine] Other (See Comments)    Seizures    Lactose Intolerance (Gi) Swelling and Other (See Comments)    cramping   Lidocaine Other (See Comments)    Seizures   Meperidine And Related Other (See Comments)    Stops heart rate (reaction to Demerol)   Metanx [L-Methylfolate-Algae-B12-B6] Other (See Comments)    Pt does not remember reaction   Metoclopramide Other (See Comments)    Pt does not remember reaction   Morphine And Related Other (See Comments)    Stops heart per pt    Nuprin [Ibuprofen] Other (See Comments)    Caused headache   Oxycodone Other (See Comments)    Hallucinations    Penicillins Other (See Comments)    Stops heart per pt    Propoxyphene Other (See Comments)    Slowed heart rate per pt (reaction to Darvocet)   Ranitidine Other (See Comments)    Pt does not remember reaction   Vicodin [Hydrocodone-Acetaminophen] Other (See Comments)    Seizures    Tylenol [Acetaminophen] Rash    ROS Review of Systems  Constitutional:  Negative for diaphoresis, fatigue, fever and unexpected weight change.  HENT:  Positive for ear pain and hearing loss. Negative for congestion, postnasal drip and sneezing.   Eyes:  Negative for itching.  Respiratory: Negative.    Cardiovascular: Negative.   Gastrointestinal:  Positive for  constipation.  Genitourinary:  Positive for difficulty urinating.     Objective:    Physical Exam Vitals and nursing note reviewed.  Constitutional:      General: She is not in acute distress.    Appearance: Normal appearance. She is not ill-appearing, toxic-appearing or diaphoretic.     Comments: She is alone in a wheelchair.    HENT:     Head: Normocephalic and atraumatic.     Right Ear: Decreased hearing noted. No middle ear effusion. There is no impacted cerumen. No foreign body. Tympanic membrane is not injected.     Left Ear: Decreased hearing noted.  No middle ear effusion. There is no impacted cerumen. No foreign body. Tympanic membrane is not injected.     Ears:      Mouth/Throat:     Mouth: Mucous membranes are moist.     Pharynx: Oropharynx is clear. No oropharyngeal exudate or posterior oropharyngeal erythema.  Pulmonary:     Effort: Pulmonary effort is normal.  Skin:    General: Skin is warm and dry.  Neurological:     Mental Status: She is alert.    BP 104/62 (BP Location: Left Arm, Patient Position: Sitting, Cuff Size: Normal)   Pulse 82   Temp (!) 97.5 F (36.4 C) (Temporal)   Ht 5\' 9"  (1.753 m)   SpO2 100%   BMI 31.01 kg/m  Wt Readings from Last 3 Encounters:  06/19/20 210 lb (95.3 kg)  04/15/20 210 lb 15.7 oz (95.7 kg)  03/24/20 211 lb (95.7 kg)     Health Maintenance Due  Topic Date Due   Hepatitis C Screening  Never done   Zoster Vaccines- Shingrix (1 of 2) Never done   DEXA SCAN  Never done   PNA vac Low Risk Adult (2 of 2 - PPSV23) 04/19/2018   INFLUENZA VACCINE  08/25/2020    There are no preventive care reminders to display for this patient.  Lab Results  Component Value Date   TSH 3.77 03/24/2020   Lab Results  Component Value Date   WBC 1.4 (L) 06/20/2020   HGB 9.5 (L) 06/20/2020   HCT 26.2 (L) 06/20/2020   MCV 89.1 06/20/2020   PLT 52 (L) 06/20/2020   Lab Results  Component Value Date   NA 125 (L) 06/20/2020   K 4.5  06/20/2020   CO2 18 (L) 06/20/2020   GLUCOSE 109 (H) 06/20/2020   BUN 18 06/20/2020   CREATININE 1.68 (H) 06/20/2020   BILITOT 1.0 06/20/2020   ALKPHOS 74 06/20/2020   AST 31 06/20/2020   ALT 19 06/20/2020   PROT 6.0 (L) 06/20/2020   ALBUMIN 2.8 (L) 06/20/2020   CALCIUM 8.1 (L) 06/20/2020   ANIONGAP 7 06/20/2020   GFR 51.27 (L) 04/21/2020   No results found for: CHOL No results found for: HDL No results found for: LDLCALC No results found for: TRIG No results found for: CHOLHDL Lab Results  Component Value Date   HGBA1C 5.1 05/08/2019      Assessment & Plan:   Problem List Items Addressed This Visit       Nervous and Auditory   Otitis externa - Primary   Relevant Medications   ofloxacin (FLOXIN) 0.3 % OTIC solution   Bilateral hearing loss   Relevant Orders   Ambulatory referral to ENT    Meds ordered this encounter  Medications   ofloxacin (FLOXIN) 0.3 % OTIC solution    Sig: Place 5 drops into both ears daily for 7 days.    Dispense:  5 mL    Refill:  0    Follow-up: Return if symptoms worsen or fail to improve.  Reviewed patient's drug list and saw no medications associated with hearing loss including Prograf. Conversion reaction?  Autoimmune? Reluctant to try nasal steroid secondary to steroid sensitivities.   Libby Maw, MD

## 2020-09-15 ENCOUNTER — Other Ambulatory Visit: Payer: Self-pay | Admitting: Gastroenterology

## 2020-09-25 ENCOUNTER — Other Ambulatory Visit: Payer: Self-pay

## 2020-09-25 DIAGNOSIS — K7469 Other cirrhosis of liver: Secondary | ICD-10-CM

## 2020-09-25 DIAGNOSIS — R748 Abnormal levels of other serum enzymes: Secondary | ICD-10-CM

## 2020-09-25 MED ORDER — LINACLOTIDE 145 MCG PO CAPS: 145.0000 ug | ORAL_CAPSULE | Freq: Every day | ORAL | 3 refills | Status: DC

## 2020-10-02 ENCOUNTER — Other Ambulatory Visit (INDEPENDENT_AMBULATORY_CARE_PROVIDER_SITE_OTHER): Payer: Medicare Other

## 2020-10-02 DIAGNOSIS — T50905A Adverse effect of unspecified drugs, medicaments and biological substances, initial encounter: Secondary | ICD-10-CM | POA: Diagnosis not present

## 2020-10-02 DIAGNOSIS — K862 Cyst of pancreas: Secondary | ICD-10-CM

## 2020-10-02 DIAGNOSIS — K754 Autoimmune hepatitis: Secondary | ICD-10-CM | POA: Diagnosis not present

## 2020-10-02 DIAGNOSIS — D6959 Other secondary thrombocytopenia: Secondary | ICD-10-CM | POA: Diagnosis not present

## 2020-10-02 DIAGNOSIS — R748 Abnormal levels of other serum enzymes: Secondary | ICD-10-CM

## 2020-10-02 DIAGNOSIS — I8500 Esophageal varices without bleeding: Secondary | ICD-10-CM

## 2020-10-02 DIAGNOSIS — D709 Neutropenia, unspecified: Secondary | ICD-10-CM | POA: Diagnosis not present

## 2020-10-02 DIAGNOSIS — K7469 Other cirrhosis of liver: Secondary | ICD-10-CM | POA: Diagnosis not present

## 2020-10-02 LAB — CBC WITH DIFFERENTIAL/PLATELET
Basophils Absolute: 0 10*3/uL (ref 0.0–0.1)
Basophils Relative: 0.1 % (ref 0.0–3.0)
Eosinophils Absolute: 0 10*3/uL (ref 0.0–0.7)
Eosinophils Relative: 0.4 % (ref 0.0–5.0)
HCT: 25.6 % — ABNORMAL LOW (ref 36.0–46.0)
Hemoglobin: 8.8 g/dL — ABNORMAL LOW (ref 12.0–15.0)
Lymphocytes Relative: 25.8 % (ref 12.0–46.0)
Lymphs Abs: 0.3 10*3/uL — ABNORMAL LOW (ref 0.7–4.0)
MCHC: 34.4 g/dL (ref 30.0–36.0)
MCV: 93.9 fl (ref 78.0–100.0)
Monocytes Absolute: 0.1 10*3/uL (ref 0.1–1.0)
Monocytes Relative: 11.7 % (ref 3.0–12.0)
Neutro Abs: 0.8 10*3/uL — ABNORMAL LOW (ref 1.4–7.7)
Neutrophils Relative %: 62 % (ref 43.0–77.0)
Platelets: 53 10*3/uL — ABNORMAL LOW (ref 150.0–400.0)
RBC: 2.73 Mil/uL — ABNORMAL LOW (ref 3.87–5.11)
RDW: 13.3 % (ref 11.5–15.5)
WBC: 1.2 10*3/uL — CL (ref 4.0–10.5)

## 2020-10-02 LAB — COMPREHENSIVE METABOLIC PANEL
ALT: 17 U/L (ref 0–35)
AST: 24 U/L (ref 0–37)
Albumin: 2.9 g/dL — ABNORMAL LOW (ref 3.5–5.2)
Alkaline Phosphatase: 56 U/L (ref 39–117)
BUN: 19 mg/dL (ref 6–23)
CO2: 21 mEq/L (ref 19–32)
Calcium: 8.1 mg/dL — ABNORMAL LOW (ref 8.4–10.5)
Chloride: 103 mEq/L (ref 96–112)
Creatinine, Ser: 2.18 mg/dL — ABNORMAL HIGH (ref 0.40–1.20)
GFR: 21.51 mL/min — ABNORMAL LOW (ref >60.00)
Glucose, Bld: 100 mg/dL — ABNORMAL HIGH (ref 70–99)
Potassium: 3.9 mEq/L (ref 3.5–5.1)
Sodium: 129 mEq/L — ABNORMAL LOW (ref 135–145)
Total Bilirubin: 0.7 mg/dL (ref 0.2–1.2)
Total Protein: 5.7 g/dL — ABNORMAL LOW (ref 6.0–8.3)

## 2020-10-02 LAB — HEPATIC FUNCTION PANEL
ALT: 17 U/L (ref 0–35)
AST: 24 U/L (ref 0–37)
Albumin: 2.9 g/dL — ABNORMAL LOW (ref 3.5–5.2)
Alkaline Phosphatase: 56 U/L (ref 39–117)
Bilirubin, Direct: 0.3 mg/dL (ref 0.0–0.3)
Total Bilirubin: 0.7 mg/dL (ref 0.2–1.2)
Total Protein: 5.7 g/dL — ABNORMAL LOW (ref 6.0–8.3)

## 2020-10-02 LAB — PROTIME-INR
INR: 1.5 ratio — ABNORMAL HIGH (ref 0.8–1.0)
Prothrombin Time: 16.4 s — ABNORMAL HIGH (ref 9.6–13.1)

## 2020-10-03 ENCOUNTER — Telehealth: Payer: Self-pay | Admitting: Family Medicine

## 2020-10-03 DIAGNOSIS — E039 Hypothyroidism, unspecified: Secondary | ICD-10-CM

## 2020-10-03 LAB — AFP TUMOR MARKER: AFP-Tumor Marker: 1.2 ng/mL

## 2020-10-03 LAB — IGG: IgG (Immunoglobin G), Serum: 1925 mg/dL — ABNORMAL HIGH (ref 600–1540)

## 2020-10-03 MED ORDER — LEVOTHYROXINE SODIUM 75 MCG PO TABS: 75.0000 ug | ORAL_TABLET | Freq: Every day | ORAL | 1 refills | Status: DC

## 2020-10-03 NOTE — Telephone Encounter (Signed)
What is the name of the medication? levothyroxine (SYNTHROID) 75 MCG tablet [[id number]]   Have you contacted your pharmacy to request a refill? Pt has been completely out of this script for a week. She is needing a refill for this weekend.   Which pharmacy would you like this sent to? [place], [place] - [address]  [place], [place] [address]  Phone:  [phone]  Fax:  [phone]  DEA #:  [id number]     Patient notified that their request is being sent to the clinical staff for review and that they should receive a call once it is complete. If they do not receive a call within 72 hours they can check with their pharmacy or our office.

## 2020-10-03 NOTE — Telephone Encounter (Signed)
Done

## 2020-10-08 ENCOUNTER — Telehealth: Payer: Self-pay | Admitting: Family Medicine

## 2020-10-08 ENCOUNTER — Telehealth: Payer: Self-pay | Admitting: Gastroenterology

## 2020-10-08 NOTE — Telephone Encounter (Signed)
Patient called regarding a referral to ENT Dr. Festus Holts

## 2020-10-08 NOTE — Telephone Encounter (Signed)
Pt encouraged to call her PCP for the referral to the ENT. Pt verbalized understanding.

## 2020-10-08 NOTE — Telephone Encounter (Signed)
Hello just got a call from Donneta Romberg. [phone] she came in for an ear issue. Dr. Ethelene Hal referred her to Dr.Teal, he is not a [facility] provider so she cannot get [facility] free transportation. She was told that Dr. Ethelene Hal needs to ask Yvette Rack to pay the cost or she needs a referral to a [facility] provider. Her appt is on Monday at 10 am.

## 2020-10-09 ENCOUNTER — Telehealth: Payer: Self-pay | Admitting: Family Medicine

## 2020-10-09 NOTE — Telephone Encounter (Signed)
Do we have someone in [facility] that she can be referred to for this? Please review and advise.   Thanks. Dm/cma  Lft VM to advise patient that we will try to see what can be done and get back to her.  Dm/cma

## 2020-10-10 NOTE — Telephone Encounter (Signed)
Patient notified VIA phone and given their number to call to schedule an appointment. Dm/cma

## 2020-10-13 NOTE — Addendum Note (Signed)
Addended by: Jon Billings on: 10/13/2020 12:53 PM   Modules accepted: Orders

## 2020-10-17 DIAGNOSIS — R3911 Hesitancy of micturition: Secondary | ICD-10-CM | POA: Diagnosis not present

## 2020-10-20 NOTE — Telephone Encounter (Signed)
Pt called concerned that when she contacted Dr Pollie Friar office, they couldn't work with her disability ... and also he is leaving end of Oct. Is there anyone else to refer to?

## 2020-10-28 ENCOUNTER — Encounter: Payer: Self-pay | Admitting: Gastroenterology

## 2020-10-28 ENCOUNTER — Other Ambulatory Visit (INDEPENDENT_AMBULATORY_CARE_PROVIDER_SITE_OTHER): Payer: Medicare Other

## 2020-10-28 ENCOUNTER — Ambulatory Visit (INDEPENDENT_AMBULATORY_CARE_PROVIDER_SITE_OTHER): Payer: Medicare Other | Admitting: Gastroenterology

## 2020-10-28 VITALS — BP 112/70 | HR 65 | Ht 69.0 in | Wt 158.0 lb

## 2020-10-28 DIAGNOSIS — K7469 Other cirrhosis of liver: Secondary | ICD-10-CM

## 2020-10-28 DIAGNOSIS — R748 Abnormal levels of other serum enzymes: Secondary | ICD-10-CM

## 2020-10-28 DIAGNOSIS — K754 Autoimmune hepatitis: Secondary | ICD-10-CM

## 2020-10-28 LAB — COMPREHENSIVE METABOLIC PANEL
ALT: 22 U/L (ref 0–35)
AST: 31 U/L (ref 0–37)
Albumin: 3 g/dL — ABNORMAL LOW (ref 3.5–5.2)
Alkaline Phosphatase: 64 U/L (ref 39–117)
BUN: 23 mg/dL (ref 6–23)
CO2: 18 mEq/L — ABNORMAL LOW (ref 19–32)
Calcium: 8.5 mg/dL (ref 8.4–10.5)
Chloride: 105 mEq/L (ref 96–112)
Creatinine, Ser: 2.29 mg/dL — ABNORMAL HIGH (ref 0.40–1.20)
GFR: 20.27 mL/min — ABNORMAL LOW (ref >60.00)
Glucose, Bld: 85 mg/dL (ref 70–99)
Potassium: 4.3 mEq/L (ref 3.5–5.1)
Sodium: 129 mEq/L — ABNORMAL LOW (ref 135–145)
Total Bilirubin: 0.9 mg/dL (ref 0.2–1.2)
Total Protein: 5.9 g/dL — ABNORMAL LOW (ref 6.0–8.3)

## 2020-10-28 LAB — CBC WITH DIFFERENTIAL/PLATELET
Basophils Absolute: 0 10*3/uL (ref 0.0–0.1)
Basophils Relative: 0.5 % (ref 0.0–3.0)
Eosinophils Absolute: 0 10*3/uL (ref 0.0–0.7)
Eosinophils Relative: 0.5 % (ref 0.0–5.0)
HCT: 26.6 % — ABNORMAL LOW (ref 36.0–46.0)
Hemoglobin: 9.1 g/dL — ABNORMAL LOW (ref 12.0–15.0)
Lymphocytes Relative: 32.2 % (ref 12.0–46.0)
Lymphs Abs: 0.5 10*3/uL — ABNORMAL LOW (ref 0.7–4.0)
MCHC: 34.1 g/dL (ref 30.0–36.0)
MCV: 92.7 fl (ref 78.0–100.0)
Monocytes Absolute: 0.2 10*3/uL (ref 0.1–1.0)
Monocytes Relative: 11.7 % (ref 3.0–12.0)
Neutro Abs: 0.8 10*3/uL — ABNORMAL LOW (ref 1.4–7.7)
Neutrophils Relative %: 55.1 % (ref 43.0–77.0)
Platelets: 64 10*3/uL — ABNORMAL LOW (ref 150.0–400.0)
RBC: 2.87 Mil/uL — ABNORMAL LOW (ref 3.87–5.11)
RDW: 14.3 % (ref 11.5–15.5)
WBC: 1.5 10*3/uL — CL (ref 4.0–10.5)

## 2020-10-28 LAB — PROTIME-INR
INR: 1.5 ratio — ABNORMAL HIGH (ref 0.8–1.0)
Prothrombin Time: 16.3 s — ABNORMAL HIGH (ref 9.6–13.1)

## 2020-10-28 NOTE — Patient Instructions (Addendum)
It was my pleasure to provide care to you today. Based on our discussion, I am providing you with my recommendations below:  RECOMMENDATION(S):   LABS:   Please proceed to the basement level for lab work before leaving today. Press "B" on the elevator. The lab is located at the first door on the left as you exit the elevator.  HEALTHCARE LAWS AND MY CHART RESULTS:   Due to recent changes in healthcare laws, you may see results of your imaging and/or laboratory studies on MyChart before I have had a chance to review them.  I understand that in some cases there may be results that are confusing or concerning to you. Please understand that not all results are received at same time and often I may need to interpret multiple results in order to provide you with the best plan of care or course of treatment. Therefore, I ask that you please give me 48 hours to thoroughly review all your results before contacting my office for clarification.    FOLLOW UP:  I would like for you to follow up with me in 3-4 months. Please call the office at [phone] to schedule your appointment.  BMI:  If you are age 70 or older, your body mass index should be between 23-30. Your Body mass index is 23.33 kg/m. If this is out of the aforementioned range listed, please consider follow up with your Primary Care Provider.  MY CHART:  The [facility] providers would like to encourage you to use [facility] to communicate with providers for non-urgent requests or questions.  Due to long hold times on the telephone, sending your provider a message by [facility] may be a faster and more efficient way to get a response.  Please allow 48 business hours for a response.  Please remember that this is for non-urgent requests.   Thank you for trusting me with your gastrointestinal care!    Thornton Park, MD, MPH

## 2020-10-28 NOTE — Progress Notes (Signed)
Referring Provider: Libby Maw,* Primary Care Physician:  Libby Maw, MD  Chief complaint:  Autoimmune hepatitis   IMPRESSION:  Cirrhosis due to AIH    - MELD of 11, CPT score 7    - hypoalbuminemia, coagulopathy, nonbleeding esophageal varices, thrombocytopenia  Autoimmune hepatitis (AIH) previously on azathioprine    - Diagnosed in 2006    - Liver biopsy 2006: Grade 3, stage I autoimmune hepatitis    - Previously on azathioprine and steroids    - Last seen in the [facility] hepatology clinic 2014    - Most recently treated by Dr. Benson Norway, last in 2018    - IgG 3501 02/27/19, 3164 05/01/19, 2620 06/21/19    - resumed azathioprine 100 mg QD 02/2019    - switched to Prograf 2/21 due to increasing IgG and progressive pancytopenia Pancreatic tail cystic lesion    - Stable 0.8cm tail lesion without high risk features, likely side branch IPMN    - follow-up MRI with and without contrast overall stable Constipation with sense of incomplete evacuation    - ? Pelvic floor dysfunction following proctectomy 2021 Gastric ulcer on EGD 02/2019    - H pylori negative on biopsies    - Resolved on EGD 04/15/20 Esophageal varices on EGD 02/10/16 Benson Norway)    - grade 2 esophageal varices on EGD 02/2019 History of advanced colon polyps    - Multiple colonoscopies with tubular adenomas and tubulovillous adenomas    - Colonoscopy 2018 at [facility]: 44m polyp in the ascending and at the hepatic flexure    - History of 20+mm distal rectal polyp on colonoscopy 03/06/19, 3 TAs, 2 HPs    - 2.6 cm rectal tubulovillous adenoma s/p TEM partial proctectomy 05/10/19 (Johney Maine    - Surveillance colonoscopy due 2024 Uses a walker at home, wheelchair when out of her apartment Transportation issues  Autoimmune hepatitis: Serum IgG may be stalling on Prograf. Plan true Prograf trough. Consider additional follow-up with [facility].  Pancytopenia: Although initially improving on Prograf, no longer any better.  Continue close monitoring per hematology.    Cirrhosis by labs and imaging: Continue screening for hepatocellular carcinoma recommended given her history of cirrhosis and esophageal varices. Proceed with cross-sectional screening in 6 months. MRI recommended given her body habitus.   Esophageal varices: Continue low dose non-selective beta-blocker therapy. Reviewed indications and side effect profile.  Stable pancreatic cyst: Stable. Plan EUS with any changes.   Constipation with altered bowel habits: History sounds consistent with pelvic floor dysfunction.  May be due to over treatment with Linzess. Will adjust the dose to PRN dosing schedule.   Gastric ulcer on EGD 02/2019: H pylori negative on biopsies. Resolved on EGD 04/15/20. Continue PPI. Add H2Blocker for ongoing nausea.   PLAN: - Abstain from all alcohol - Adjust Linzess to PRN due to diarrhea - Continue tacrolimus 3 mg QD  - Continue labs every 2 weeks including hepatic function panel and IgG - Semi-annual labs: CMP, CBC with diff, IgG and PT/INR -today - Continue omeprazole 40 mg BID - Add famotidine 20 mg BID - Avoid NSAIDs - Continue carvedilol 6.25 mg daily - MRI for hepatocellular carcinoma screening due February 2023 - Surveillance Colonoscopy 04/2022 - Follow-up in this office in 3-4 months, earlier as needed  I spent over 50 minutes of time, including in depth chart review, independent review of results as outlined above, communicating results with the patient directly, face-to-face time with the patient, coordinating care, ordering studies  and medications as appropriate, and documentation.      HPI: Stacey Velazquez is a 75 y.o. female who returns in follow-up for cirrhosis due to autoimmune hepatitis managed on prograf due to lack of response and progressive pancytopenia on azathioprine.  Her last office visit was 06/19/20.    Presents today for routine cirrhosis care after her MRINo GI complaints today except for some  diarrhea. She was very pleased that she had lost some weight when she weighed today.   Recent Labs: 10/28/20: Na 129, crt 2.29, TB 0.9, AST 31, ALT 22, alk phos 64, WBC 1.5, hgb 9.1, platelets 64   Endoscopic History: EGD 03/06/19: Grade II varices were found in the middle third of the esophagus. Moderate portal hypertensive gastropathy. Gastritis. One non-bleeding cratered gastric ulcer with no stigmata of bleeding was found on the lesser curvature of the stomach. There was no H pylori.  Colonoscopy with Dr. Benson Norway 02/10/2016 showed 6 polyps.  4 polyps ranged from 3 to 6 mm were removed and the descending: And transverse colon.  There were 2 polyps ranging from 30 to 40 mm that were biopsied and tattooed in the ascending colon and transverse.  Pathology report showed tubulous villous adenoma of high-grade dysplasia.  The patient was referred to [facility].   Colonoscopy with Dr. Stephanie Acre at [facility] 05/26/16: "The perianal and digital rectal examinations were normal. A greater than 50 mm polyp was found in the proximal ascending colon. The polyp was sessile. Preparations were made for mucosal resection. Saline with methylene blue was injected to raise the lesion. Snare mucosal resection was performed. Resection and retrieval were complete. Coagulation for tissue destruction using snare was successful. A 50 mm polyp was found in the hepatic flexure. The polyp was semi-pedunculated. Preparations were made for mucosal resection. Saline with methylene blue was injected to raise the lesion. Snare mucosal resection was performed. Resection and retrieval were complete. Coagulation for tissue destruction using snare was successful. The exam was otherwise without abnormality on direct and retroflexion  views." Pathology revealed Tubulovillous adenoma (multiple fragments) with multiple foci of high-grade dysplasia.  Surveillance colonoscopy recommended in 1 year.  Colonoscopy 03/06/19 showed a 20 mm polyp in the distal rectum adjacent to  the dentate line. Biopsies were consistent with tubulovillous adenoma with high-grade grandular dysplasia. She also had three 1 to 4 mm tubular adenomas and two hyperplastic polyps removed at that time.  TEM partial proctectomy of bleeding rectal mass with Dr. Johney Maine.  Pathology revealed a 2.6 cm tubulovillous adenoma.  Margins were uninvolved by dysplasia. EGD 04/15/20 showed 4 columns of grade 2 varices, portal hypertensive gastropathy, gastritis.  Gastric erosion had resolved.  Recent abdominal imaging: - Abdominal ultrasound1/25/2017: changes of cirrhosis with a subtle nodularity to the liver contours.  No focal abnormality.  Prior cholecystectomy.  - Abdominal ultrasound 03/05/19: cirrhosis and splenomegaly. There was no ascites.  - MRI 09/01/19: cirrhosis without liver mass. Splenomegaly. No ascites. Collateral gastroesophageal and paraumbilical varices. 0.8 cm pancreatic tail cystic lesion - CT abdomen pelvis with contrast 11/15/2019 for lower left quadrant pain: Cirrhosis, splenomegaly, spontaneous splenorenal shunt, sigmoid diverticulosis, aortic atherosclerosis - MRI 03/05/2020: 8 mm pancreatic cyst unchanged likely representing an IPMN, cirrhosis, benign renal cysts - MRI 09/08/20:  unchanged pancreatic tail cystic lesion, cirrhosis without [place]    Past Medical History:  Diagnosis Date   Adenomatous polyp of ascending colon    Anxiety    Bilateral lower extremity edema    burning sensations   Bipolar 1  disorder ([place])    Carpal tunnel syndrome    Cirrhosis of liver (HCC)    CKD (chronic kidney disease) stage 4, GFR 15-29 ml/min (HCC) 11/04/2020   Cutaneous horn    Drug-induced parkinsonism ([place])    patient unaware   Esophageal varices (HCC)    Fibromyalgia    patient denies   Gastric ulcer    GERD (gastroesophageal reflux disease)    Gingival abscess 08/26/2016   Glaucoma 2003   pt unaware   Heart attack ([place])    Heart disease    Hepatitis, autoimmune ([place]) 08/25/2014   History of  iron deficiency anemia    Hypotension    Hypothyroidism    Migraines    Neutropenia (HCC)    Osteoporosis    Pacemaker Medtronic    MRI compatible   Paranoid schizophrenia ([place])    Polyarthritis    Polyosteoarthritis    Rectal mass    Seizures ([place])    last sz 08/21/14   Sinus arrest    Skin cancer    forehead   SSS (sick sinus syndrome) ([place])    Stroke ([place])    Thrombocytopenia ([place])    Vertigo    bvvp    Past Surgical History:  Procedure Laterality Date   BIOPSY  03/06/2019   Procedure: BIOPSY;  Surgeon: Thornton Park, MD;  Location: [facility];  Service: Gastroenterology;;  EGD and COLON   BIOPSY  04/15/2020   Procedure: BIOPSY;  Surgeon: Thornton Park, MD;  [facility];  Service: Gastroenterology;;   CATARACT EXTRACTION Left 2008   pt unaware   CHOLECYSTECTOMY     COLONOSCOPY WITH PROPOFOL N/A 03/06/2019   Procedure: COLONOSCOPY WITH PROPOFOL;  Surgeon: Thornton Park, MD;  [facility];  Service: Gastroenterology;  Laterality: N/A;   ESOPHAGOGASTRODUODENOSCOPY (EGD) WITH PROPOFOL N/A 03/06/2019   Procedure: ESOPHAGOGASTRODUODENOSCOPY (EGD) WITH PROPOFOL ;  Surgeon: Thornton Park, MD;  [facility];  Service: Gastroenterology;  Laterality: N/A;   ESOPHAGOGASTRODUODENOSCOPY (EGD) WITH PROPOFOL N/A 04/15/2020   Procedure: ESOPHAGOGASTRODUODENOSCOPY (EGD) WITH PROPOFOL;  Surgeon: Thornton Park, MD;  [facility];  Service: Gastroenterology;  Laterality: N/A;   KNEE SURGERY Left 1985   PACEMAKER INSERTION  2014   PARTIAL HYSTERECTOMY  1979   PARTIAL PROCTECTOMY BY TEM N/A 05/10/2019   Procedure: TEM PARTIAL PROCTECTOMY OF RECTAL MASS WITH EXCISION RIGHT PERINEAL SKIN MASS;  Surgeon: Michael Boston, MD;  Location: [facility];  Service: General;  Laterality: N/A;   POLYPECTOMY  03/06/2019   Procedure: POLYPECTOMY;  Surgeon: Thornton Park, MD;  [facility];  Service: Gastroenterology;;   REFRACTIVE SURGERY      TUBAL LIGATION         Allergies as of 10/28/2020 - Review Complete 10/28/2020  Allergen Reaction Noted   Neomycin Nausea And Vomiting 05/10/2019   Ambien [zolpidem tartrate] Other (See Comments) 02/21/2013   Amitriptyline Other (See Comments) 02/21/2013   Ampicillin Other (See Comments) 02/21/2013   Anaprox [naproxen sodium] Other (See Comments) 02/21/2013   Benadryl [diphenhydramine hcl (sleep)] Other (See Comments) 02/21/2013   Cetirizine & related Other (See Comments) 02/21/2013   Cortizone-10 [hydrocortisone] Other (See Comments) 02/21/2013   Darvon [propoxyphene] Other (See Comments) 02/21/2013   Diazepam Other (See Comments) 02/21/2013   Diflunisal Swelling and Other (See Comments) 02/21/2013   Duloxetine Other (See Comments) 02/21/2013   Flexeril [cyclobenzaprine] Other (See Comments) 02/21/2013   Lactose intolerance (gi) Swelling and Other (See Comments) 08/23/2014   Lidocaine Other (See Comments) 02/21/2013   Meperidine  and related Other (See Comments) 02/21/2013   Metanx [l-methylfolate-algae-b12-b6] Other (See Comments) 02/21/2013   Metoclopramide Other (See Comments) 02/21/2013   Morphine and related Other (See Comments) 02/21/2013   Nuprin [ibuprofen] Other (See Comments) 02/21/2013   Oxycodone Other (See Comments) 02/21/2013   Penicillins Other (See Comments) 02/21/2013   Propoxyphene Other (See Comments) 02/21/2013   Ranitidine Other (See Comments) 02/21/2013   Vicodin [hydrocodone-acetaminophen] Other (See Comments) 02/21/2013   Tylenol [acetaminophen] Rash 02/21/2013    Family History  Problem Relation Age of Onset   Heart attack Father    Heart disease Paternal Uncle    Heart disease Paternal Grandmother    Diabetes Paternal Grandmother    Heart disease Paternal Grandfather    Breast cancer Mother    Cancer Maternal Grandmother    CVA Maternal Grandmother    Cancer Maternal Grandfather    CVA Maternal Grandfather    Diabetes Sister    Diabetes  Brother    Diabetes Brother    Stroke Daughter      Physical Exam: General:   Alert,  well-nourished, pleasant and cooperative in NAD. Sitting in a wheelchair. Progressive loss of hearing noted.  Heart:  Regular rate and rhythm; no murmurs Pulm: Clear anteriorly; no wheezing Abdomen:  Soft. Central obesity. Nontender. Nondistended. Normal bowel sounds. No rebound or guarding. No fluid wave.  LAD: No inguinal or umbilical LAD Extremities:  Without edema. Neurologic:  Alert and  oriented x4;  grossly normal neurologically; no asterixis or clonus. Skin: No jaundice. Palmar erythema. Spider angioma on the chest wall.  Violaceous lesion on the right forearm.  Psych:  Alert and cooperative. Normal mood and affect.     Roseanna Koplin L. Tarri Glenn, MD, MPH 11/09/2020, 8:50 PM

## 2020-10-29 ENCOUNTER — Other Ambulatory Visit: Payer: Self-pay

## 2020-10-29 DIAGNOSIS — K7469 Other cirrhosis of liver: Secondary | ICD-10-CM

## 2020-10-29 DIAGNOSIS — D709 Neutropenia, unspecified: Secondary | ICD-10-CM

## 2020-10-29 DIAGNOSIS — K862 Cyst of pancreas: Secondary | ICD-10-CM

## 2020-10-29 DIAGNOSIS — K754 Autoimmune hepatitis: Secondary | ICD-10-CM

## 2020-10-29 DIAGNOSIS — R7989 Other specified abnormal findings of blood chemistry: Secondary | ICD-10-CM

## 2020-10-29 MED ORDER — TACROLIMUS 1 MG PO CAPS: 2.0000 mg | ORAL_CAPSULE | Freq: Every day | ORAL | 3 refills | Status: DC

## 2020-10-31 ENCOUNTER — Ambulatory Visit (INDEPENDENT_AMBULATORY_CARE_PROVIDER_SITE_OTHER): Payer: Medicare Other

## 2020-10-31 DIAGNOSIS — I495 Sick sinus syndrome: Secondary | ICD-10-CM

## 2020-11-04 ENCOUNTER — Other Ambulatory Visit: Payer: Self-pay

## 2020-11-04 ENCOUNTER — Encounter: Payer: Self-pay | Admitting: Family Medicine

## 2020-11-04 ENCOUNTER — Ambulatory Visit (INDEPENDENT_AMBULATORY_CARE_PROVIDER_SITE_OTHER): Payer: Medicare Other | Admitting: Family Medicine

## 2020-11-04 VITALS — BP 110/78 | HR 73 | Temp 97.4°F | Ht 69.0 in | Wt 158.0 lb

## 2020-11-04 DIAGNOSIS — Z23 Encounter for immunization: Secondary | ICD-10-CM | POA: Diagnosis not present

## 2020-11-04 DIAGNOSIS — E871 Hypo-osmolality and hyponatremia: Secondary | ICD-10-CM | POA: Diagnosis not present

## 2020-11-04 DIAGNOSIS — N184 Chronic kidney disease, stage 4 (severe): Secondary | ICD-10-CM

## 2020-11-04 HISTORY — DX: Chronic kidney disease, stage 4 (severe): N18.4

## 2020-11-04 LAB — COMPREHENSIVE METABOLIC PANEL
ALT: 29 U/L (ref 0–35)
AST: 37 U/L (ref 0–37)
Albumin: 3.1 g/dL — ABNORMAL LOW (ref 3.5–5.2)
Alkaline Phosphatase: 70 U/L (ref 39–117)
BUN: 21 mg/dL (ref 6–23)
CO2: 18 mEq/L — ABNORMAL LOW (ref 19–32)
Calcium: 8.3 mg/dL — ABNORMAL LOW (ref 8.4–10.5)
Chloride: 104 mEq/L (ref 96–112)
Creatinine, Ser: 2.01 mg/dL — ABNORMAL HIGH (ref 0.40–1.20)
GFR: 23.7 mL/min — ABNORMAL LOW (ref >60.00)
Glucose, Bld: 84 mg/dL (ref 70–99)
Potassium: 4.2 mEq/L (ref 3.5–5.1)
Sodium: 128 mEq/L — ABNORMAL LOW (ref 135–145)
Total Bilirubin: 1 mg/dL (ref 0.2–1.2)
Total Protein: 6 g/dL (ref 6.0–8.3)

## 2020-11-04 NOTE — Progress Notes (Addendum)
Established Patient Office Visit  Subjective:  Patient ID: Stacey Velazquez, female    DOB: April 26, 1945  Age: 75 y.o. MRN: [id number]  CC:  Chief Complaint  Patient presents with   Follow-up    2 mo f/u. MRI results. No main concerns.    HPI Stacey Velazquez presents for follow-up of worsening GFR.  Ongoing history of autoimmune hepatitis and leukopenia associated with the treatment for it.  Patient continues to produce urine.  She has a urgency with small urine output.  Denies dysuria or hematuria.  She denies incontinence but wears a depends.  Chart indicates significant weight loss since May.  Past Medical History:  Diagnosis Date   Adenomatous polyp of ascending colon    Anxiety    Bilateral lower extremity edema    burning sensations   Bipolar 1 disorder (HCC)    Carpal tunnel syndrome    Cirrhosis of liver (HCC)    CKD (chronic kidney disease) stage 4, GFR 15-29 ml/min (HCC) 11/04/2020   Cutaneous horn    Drug-induced parkinsonism ([place])    patient unaware   Esophageal varices (HCC)    Fibromyalgia    patient denies   Gastric ulcer    GERD (gastroesophageal reflux disease)    Gingival abscess 08/26/2016   Glaucoma 2003   pt unaware   Heart attack ([place])    Heart disease    Hepatitis, autoimmune ([place]) 08/25/2014   History of iron deficiency anemia    Hypotension    Hypothyroidism    Migraines    Neutropenia (HCC)    Osteoporosis    Pacemaker Medtronic    MRI compatible   Paranoid schizophrenia ([place])    Polyarthritis    Polyosteoarthritis    Rectal mass    Seizures ([place])    last sz 08/21/14   Sinus arrest    Skin cancer    forehead   SSS (sick sinus syndrome) ([place])    Stroke ([place])    Thrombocytopenia ([place])    Vertigo    bvvp    Past Surgical History:  Procedure Laterality Date   BIOPSY  03/06/2019   Procedure: BIOPSY;  Surgeon: Thornton Park, MD;  [facility];  Service: Gastroenterology;;  EGD and COLON   BIOPSY  04/15/2020   Procedure:  BIOPSY;  Surgeon: Thornton Park, MD;  [facility];  Service: Gastroenterology;;   CATARACT EXTRACTION Left 2008   pt unaware   CHOLECYSTECTOMY     COLONOSCOPY WITH PROPOFOL N/A 03/06/2019   Procedure: COLONOSCOPY WITH PROPOFOL;  Surgeon: Thornton Park, MD;  [facility];  Service: Gastroenterology;  Laterality: N/A;   ESOPHAGOGASTRODUODENOSCOPY (EGD) WITH PROPOFOL N/A 03/06/2019   Procedure: ESOPHAGOGASTRODUODENOSCOPY (EGD) WITH PROPOFOL ;  Surgeon: Thornton Park, MD;  [facility];  Service: Gastroenterology;  Laterality: N/A;   ESOPHAGOGASTRODUODENOSCOPY (EGD) WITH PROPOFOL N/A 04/15/2020   Procedure: ESOPHAGOGASTRODUODENOSCOPY (EGD) WITH PROPOFOL;  Surgeon: Thornton Park, MD;  [facility];  Service: Gastroenterology;  Laterality: N/A;   KNEE SURGERY Left 1985   PACEMAKER INSERTION  2014   PARTIAL HYSTERECTOMY  1979   PARTIAL PROCTECTOMY BY TEM N/A 05/10/2019   Procedure: TEM PARTIAL PROCTECTOMY OF RECTAL MASS WITH EXCISION RIGHT PERINEAL SKIN MASS;  Surgeon: Michael Boston, MD;  [facility];  Service: General;  Laterality: N/A;   POLYPECTOMY  03/06/2019   Procedure: POLYPECTOMY;  Surgeon: Thornton Park, MD;  [facility];  Service: Gastroenterology;;   [place]  Family History  Problem Relation Age of Onset   Heart attack Father    Heart disease Paternal Uncle    Heart disease Paternal Grandmother    Diabetes Paternal Grandmother    Heart disease Paternal Grandfather    Breast cancer Mother    Cancer Maternal Grandmother    CVA Maternal Grandmother    Cancer Maternal Grandfather    CVA Maternal Grandfather    Diabetes Sister    Diabetes Brother    Diabetes Brother    Stroke Daughter     Social History   Socioeconomic History   Marital status: Widowed    Spouse name: Not on file   Number of children: Not on file   Years of education: Not on file   Highest education  level: Not on file  Occupational History   Occupation: disable  Tobacco Use   Smoking status: Former    Types: Cigarettes    Quit date: 03/02/2003    Years since quitting: 17.7   Smokeless tobacco: Never  Vaping Use   Vaping Use: Never used  Substance and Sexual Activity   Alcohol use: No   Drug use: No   Sexual activity: Not on file  Other Topics Concern   Not on file  Social History Narrative   Lives alone, "have friends and relatives that come and help me"   caffeine use- coffee -1 cup daily   Social Determinants of Health   Financial Resource Strain: Not on file  Food Insecurity: Not on file  Transportation Needs: Not on file  Physical Activity: Not on file  Stress: Not on file  Social Connections: Not on file  Intimate Partner Violence: Not on file    Outpatient Medications Prior to Visit  Medication Sig Dispense Refill   dexlansoprazole (DEXILANT) 60 MG capsule Take 1 capsule (60 mg total) by mouth daily. 90 capsule 3   famotidine (PEPCID) 20 MG tablet TAKE 1 TABLET (20 MG TOTAL) BY MOUTH AT BEDTIME. 90 tablet 0   levothyroxine (SYNTHROID) 75 MCG tablet Take 1 tablet (75 mcg total) by mouth daily before breakfast. 90 tablet 1   linaclotide (LINZESS) 145 MCG CAPS capsule Take 1 capsule (145 mcg total) by mouth daily before breakfast. 30 capsule 3   Probiotic Product (DIGESTIVE ADV DIGESTIVE/IMMUNE) CHEW Chew 2 capsules by mouth at bedtime.     spironolactone (ALDACTONE) 25 MG tablet TAKE 1/2 TABLET (12.5 MG TOTAL) BY MOUTH DAILY. 45 tablet 0   tamsulosin (FLOMAX) 0.4 MG CAPS capsule Take 0.4 mg by mouth at bedtime.     tacrolimus (PROGRAF) 1 MG capsule Take 2 capsules (2 mg total) by mouth daily. 60 capsule 3   No facility-administered medications prior to visit.    Allergies  Allergen Reactions   Neomycin Nausea And Vomiting   Ambien [Zolpidem Tartrate] Other (See Comments)    Hallucinations    Amitriptyline Other (See Comments)    Stops heart per pt    Ampicillin Other (See Comments)    Stops heart per pt    Anaprox [Naproxen Sodium] Other (See Comments)    Doesn't work per pt   Benadryl [Diphenhydramine Hcl (Sleep)] Other (See Comments)    Rash   Cetirizine & Related Other (See Comments)    Swelling    Cortizone-10 [Hydrocortisone] Other (See Comments)    seizures    Darvon [Propoxyphene] Other (See Comments)    Increased heart rate per pt    Diazepam Other (See Comments)    Stops heart  per pt   Diflunisal Swelling and Other (See Comments)    Stops heart per pt (Dolobid)   Duloxetine Other (See Comments)    Reaction to Cymbalta - pt doesn't remember what the reaction was   Flexeril [Cyclobenzaprine] Other (See Comments)    Seizures    Lactose Intolerance (Gi) Swelling and Other (See Comments)    cramping   Lidocaine Other (See Comments)    Seizures   Meperidine And Related Other (See Comments)    Stops heart rate (reaction to Demerol)   Metanx [L-Methylfolate-Algae-B12-B6] Other (See Comments)    Pt does not remember reaction   Metoclopramide Other (See Comments)    Pt does not remember reaction   Morphine And Related Other (See Comments)    Stops heart per pt    Nuprin [Ibuprofen] Other (See Comments)    Caused headache   Oxycodone Other (See Comments)    Hallucinations    Penicillins Other (See Comments)    Stops heart per pt    Propoxyphene Other (See Comments)    Slowed heart rate per pt (reaction to Darvocet)   Ranitidine Other (See Comments)    Pt does not remember reaction   Vicodin [Hydrocodone-Acetaminophen] Other (See Comments)    Seizures    Tylenol [Acetaminophen] Rash    ROS Review of Systems  Constitutional:  Positive for unexpected weight change. Negative for diaphoresis, fatigue and fever.  HENT:  Positive for hearing loss.   Eyes:  Negative for photophobia and visual disturbance.  Gastrointestinal:  Positive for abdominal pain and constipation.  Genitourinary:  Positive for urgency.  Negative for dysuria and hematuria.  Musculoskeletal:  Positive for gait problem.  Neurological:  Negative for speech difficulty.  Psychiatric/Behavioral: Negative.   She is no problem    Objective:    Physical Exam Vitals and nursing note reviewed.  Constitutional:      General: She is not in acute distress.    Appearance: She is ill-appearing. She is not toxic-appearing or diaphoretic.  HENT:     Head: Normocephalic and atraumatic.     Right Ear: External ear normal.     Left Ear: External ear normal.  Eyes:     General: No scleral icterus.       Right eye: No discharge.        Left eye: No discharge.     Conjunctiva/sclera: Conjunctivae normal.  Pulmonary:     Effort: Pulmonary effort is normal.  Skin:    General: Skin is warm and dry.  Neurological:     Mental Status: She is alert and oriented to person, place, and time.  Psychiatric:        Mood and Affect: Mood normal.        Behavior: Behavior normal.    BP 110/78 (BP Location: Right Arm, Patient Position: Sitting, Cuff Size: Normal)   Pulse 73   Temp (!) 97.4 F (36.3 C) (Temporal)   Ht 5\' 9"  (1.753 m) Comment: patient reported  Wt 158 lb (71.7 kg) Comment: patient reported  SpO2 99%   BMI 23.33 kg/m  Wt Readings from Last 3 Encounters:  11/04/20 158 lb (71.7 kg)  10/28/20 158 lb (71.7 kg)  06/19/20 210 lb (95.3 kg)     Health Maintenance Due  Topic Date Due   Hepatitis C Screening  Never done   Zoster Vaccines- Shingrix (1 of 2) Never done   DEXA SCAN  Never done    There are no preventive care reminders to  display for this patient.  Lab Results  Component Value Date   TSH 3.77 03/24/2020   Lab Results  Component Value Date   WBC 1.5 Repeated and verified X2. (LL) 10/28/2020   HGB 9.1 (L) 10/28/2020   HCT 26.6 (L) 10/28/2020   MCV 92.7 10/28/2020   PLT 64.0 Repeated and verified X2. (L) 10/28/2020   Lab Results  Component Value Date   NA 129 (L) 11/06/2020   K 4.7 11/06/2020   CO2 19  11/06/2020   GLUCOSE 90 11/06/2020   BUN 23 11/06/2020   CREATININE 2.03 (H) 11/06/2020   BILITOT 0.8 11/06/2020   ALKPHOS 71 11/06/2020   AST 34 11/06/2020   ALT 26 11/06/2020   PROT 5.8 (L) 11/06/2020   ALBUMIN 3.0 (L) 11/06/2020   CALCIUM 8.5 11/06/2020   ANIONGAP 7 06/20/2020   GFR 23.42 (L) 11/06/2020   No results found for: CHOL No results found for: HDL No results found for: LDLCALC No results found for: TRIG No results found for: CHOLHDL Lab Results  Component Value Date   HGBA1C 5.1 05/08/2019      Assessment & Plan:   Problem List Items Addressed This Visit       Genitourinary   CKD (chronic kidney disease) stage 4, GFR 15-29 ml/min (HCC)   Relevant Orders   Protein Electrophoresis, (serum) (Completed)   Comprehensive metabolic panel (Completed)   Ambulatory referral to Nephrology     Other   Hyponatremia   Relevant Orders   Osmolality   Basic metabolic panel   Comprehensive metabolic panel (Completed)   Ambulatory referral to Nephrology   Need for influenza vaccination - Primary   Relevant Orders   Flu Vaccine QUAD High Dose(Fluad) (Completed)    No orders of the defined types were placed in this encounter.   Follow-up: Return in about 1 month (around 12/05/2020), or if symptoms worsen or fail to improve.    Libby Maw, MD

## 2020-11-05 ENCOUNTER — Telehealth: Payer: Self-pay

## 2020-11-05 ENCOUNTER — Telehealth: Payer: Self-pay | Admitting: Family Medicine

## 2020-11-05 DIAGNOSIS — D709 Neutropenia, unspecified: Secondary | ICD-10-CM

## 2020-11-05 DIAGNOSIS — K754 Autoimmune hepatitis: Secondary | ICD-10-CM

## 2020-11-05 DIAGNOSIS — K862 Cyst of pancreas: Secondary | ICD-10-CM

## 2020-11-05 DIAGNOSIS — K7469 Other cirrhosis of liver: Secondary | ICD-10-CM

## 2020-11-05 LAB — CUP PACEART REMOTE DEVICE CHECK
Battery Remaining Longevity: 35 mo
Battery Voltage: 2.95 V
Brady Statistic AP VP Percent: 0.98 %
Brady Statistic AP VS Percent: 43.27 %
Brady Statistic AS VP Percent: 2.51 %
Brady Statistic AS VS Percent: 53.24 %
Brady Statistic RA Percent Paced: 44.18 %
Brady Statistic RV Percent Paced: 3.67 %
Date Time Interrogation Session: 20221012121721
Implantable Lead Implant Date: 20141205
Implantable Lead Implant Date: 20141205
Implantable Lead Location: 753859
Implantable Lead Location: 753860
Implantable Lead Model: 5076
Implantable Lead Model: 5076
Implantable Pulse Generator Implant Date: 20141205
Lead Channel Impedance Value: 285 Ohm
Lead Channel Impedance Value: 361 Ohm
Lead Channel Impedance Value: 380 Ohm
Lead Channel Impedance Value: 418 Ohm
Lead Channel Pacing Threshold Amplitude: 0.5 V
Lead Channel Pacing Threshold Amplitude: 0.75 V
Lead Channel Pacing Threshold Pulse Width: 0.4 ms
Lead Channel Pacing Threshold Pulse Width: 0.4 ms
Lead Channel Sensing Intrinsic Amplitude: 1.125 mV
Lead Channel Sensing Intrinsic Amplitude: 1.125 mV
Lead Channel Sensing Intrinsic Amplitude: 2.5 mV
Lead Channel Sensing Intrinsic Amplitude: 2.5 mV
Lead Channel Setting Pacing Amplitude: 2 V
Lead Channel Setting Pacing Amplitude: 2.5 V
Lead Channel Setting Pacing Pulse Width: 0.4 ms
Lead Channel Setting Sensing Sensitivity: 0.6 mV

## 2020-11-05 NOTE — Telephone Encounter (Signed)
Pt was seen yesterday by Dr. Ethelene Hal and she has may ?'s about her AVS, one of her questions is-what does Hyponatremia mean? She has seen this on her AVS. Please advise pt at [phone].

## 2020-11-05 NOTE — Telephone Encounter (Signed)
Routing this message to Dr. Tarri Glenn for her to review results of BMP obtained by Dr. Ethelene Hal. Pt has not completed UA. Attempted to call pt x2 with reminder but have failed to reach pt. Will continue efforts.  Plan from 10/28/20 lab results:  Labs are overall stable or improved except for increasing creatinine.    Thirza Pellicano, would you please ask Mrs. Lieber to stop her carvedilol given her decline in renal function. PT AWARE She should also hold her Prograf for 2 days and then resume at 2 mg daily. CALLED PT X2 TO FOLLOW UP. UNABLE TO REACH PT I would like for her to have repeat BMP early next week. BMP OBTAINED BY DR. KREMER  Please obtain concurrent urinalysis. DID NOT COMPLETE. CALLED PT X2 TO F/U. UNABLE TO REACH Dr. Ethelene Hal, would you please help with evaluation and management of her progressive renal dysfunction? IN PROCESS

## 2020-11-06 ENCOUNTER — Other Ambulatory Visit (INDEPENDENT_AMBULATORY_CARE_PROVIDER_SITE_OTHER): Payer: Medicare Other

## 2020-11-06 DIAGNOSIS — K862 Cyst of pancreas: Secondary | ICD-10-CM

## 2020-11-06 DIAGNOSIS — K754 Autoimmune hepatitis: Secondary | ICD-10-CM | POA: Diagnosis not present

## 2020-11-06 DIAGNOSIS — R7989 Other specified abnormal findings of blood chemistry: Secondary | ICD-10-CM | POA: Diagnosis not present

## 2020-11-06 DIAGNOSIS — D709 Neutropenia, unspecified: Secondary | ICD-10-CM

## 2020-11-06 DIAGNOSIS — K7469 Other cirrhosis of liver: Secondary | ICD-10-CM

## 2020-11-06 LAB — PROTEIN ELECTROPHORESIS, SERUM
Albumin ELP: 3.2 g/dL — ABNORMAL LOW (ref 3.8–4.8)
Alpha 1: 0.2 g/dL (ref 0.2–0.3)
Alpha 2: 0.4 g/dL — ABNORMAL LOW (ref 0.5–0.9)
Beta 2: 0.2 g/dL (ref 0.2–0.5)
Beta Globulin: 0.2 g/dL — ABNORMAL LOW (ref 0.4–0.6)
Gamma Globulin: 1.8 g/dL — ABNORMAL HIGH (ref 0.8–1.7)
Total Protein: 6 g/dL — ABNORMAL LOW (ref 6.1–8.1)

## 2020-11-06 LAB — COMPREHENSIVE METABOLIC PANEL
ALT: 26 U/L (ref 0–35)
AST: 34 U/L (ref 0–37)
Albumin: 3 g/dL — ABNORMAL LOW (ref 3.5–5.2)
Alkaline Phosphatase: 71 U/L (ref 39–117)
BUN: 23 mg/dL (ref 6–23)
CO2: 19 mEq/L (ref 19–32)
Calcium: 8.5 mg/dL (ref 8.4–10.5)
Chloride: 106 mEq/L (ref 96–112)
Creatinine, Ser: 2.03 mg/dL — ABNORMAL HIGH (ref 0.40–1.20)
GFR: 23.42 mL/min — ABNORMAL LOW (ref >60.00)
Glucose, Bld: 90 mg/dL (ref 70–99)
Potassium: 4.7 mEq/L (ref 3.5–5.1)
Sodium: 129 mEq/L — ABNORMAL LOW (ref 135–145)
Total Bilirubin: 0.8 mg/dL (ref 0.2–1.2)
Total Protein: 5.8 g/dL — ABNORMAL LOW (ref 6.0–8.3)

## 2020-11-06 MED ORDER — TACROLIMUS 1 MG PO CAPS: 2.0000 mg | ORAL_CAPSULE | Freq: Every day | ORAL | 3 refills | Status: DC

## 2020-11-06 NOTE — Telephone Encounter (Signed)
Called pt to inform about Dr. Tarri Glenn recommendation. LVM requesting returned call.

## 2020-11-06 NOTE — Telephone Encounter (Signed)
Pt returned call. Has been given supplies for her to obtain and return her urine to the lab. Will await results. Pt has also been made aware to hold Prograf to preserve renal health. Verbalized acceptance and understanding.

## 2020-11-09 ENCOUNTER — Encounter: Payer: Self-pay | Admitting: Gastroenterology

## 2020-11-10 NOTE — Addendum Note (Signed)
Addended by: Jon Billings on: 11/10/2020 08:39 AM   Modules accepted: Orders

## 2020-11-11 NOTE — Progress Notes (Signed)
Remote pacemaker transmission.   

## 2020-11-11 NOTE — Telephone Encounter (Signed)
Patient calling back requesting a call from a nurse please in regards to her lab sample.

## 2020-11-12 NOTE — Telephone Encounter (Signed)
Addressed in previously created encounter 

## 2020-11-14 ENCOUNTER — Other Ambulatory Visit (INDEPENDENT_AMBULATORY_CARE_PROVIDER_SITE_OTHER): Payer: Medicare Other

## 2020-11-14 DIAGNOSIS — K754 Autoimmune hepatitis: Secondary | ICD-10-CM

## 2020-11-14 DIAGNOSIS — K7469 Other cirrhosis of liver: Secondary | ICD-10-CM

## 2020-11-14 DIAGNOSIS — N390 Urinary tract infection, site not specified: Secondary | ICD-10-CM

## 2020-11-14 DIAGNOSIS — R7989 Other specified abnormal findings of blood chemistry: Secondary | ICD-10-CM

## 2020-11-14 DIAGNOSIS — K862 Cyst of pancreas: Secondary | ICD-10-CM

## 2020-11-14 DIAGNOSIS — D709 Neutropenia, unspecified: Secondary | ICD-10-CM

## 2020-11-14 LAB — URINALYSIS, ROUTINE W REFLEX MICROSCOPIC
Bilirubin Urine: NEGATIVE
Hgb urine dipstick: NEGATIVE
Ketones, ur: NEGATIVE
Nitrite: NEGATIVE
Specific Gravity, Urine: 1.01 (ref 1.000–1.030)
Total Protein, Urine: NEGATIVE
Urine Glucose: NEGATIVE
Urobilinogen, UA: 0.2 (ref 0.0–1.0)
pH: 5.5 (ref 5.0–8.0)

## 2020-11-18 ENCOUNTER — Telehealth: Payer: Self-pay | Admitting: Gastroenterology

## 2020-11-18 ENCOUNTER — Other Ambulatory Visit: Payer: Self-pay

## 2020-11-18 DIAGNOSIS — N390 Urinary tract infection, site not specified: Secondary | ICD-10-CM

## 2020-11-18 DIAGNOSIS — N184 Chronic kidney disease, stage 4 (severe): Secondary | ICD-10-CM

## 2020-11-18 DIAGNOSIS — Z792 Long term (current) use of antibiotics: Secondary | ICD-10-CM

## 2020-11-18 MED ORDER — LEVOFLOXACIN 750 MG PO TABS: 750.0000 mg | ORAL_TABLET | ORAL | 0 refills | Status: AC

## 2020-11-18 NOTE — Telephone Encounter (Signed)
Addressed in previously created encounter 

## 2020-11-18 NOTE — Telephone Encounter (Signed)
Inbound call from pt requesting a call back stating that she has questions regarding the medication Levaquin and also if someone could explain to her what type of infection she has. Please advise. Thank you.

## 2020-11-21 ENCOUNTER — Telehealth: Payer: Self-pay | Admitting: Gastroenterology

## 2020-11-21 NOTE — Telephone Encounter (Signed)
Per Dr. Tarri Glenn orders:  Urinalysis shows likely UTI. I recommend levofloxacin 750 mg every other day for 10 days (total of 5 doses). I would like her to have a BMP this week and next week to follow-up on her renal function. Thank you.   Given transportation concern, will route to Dr. Tarri Glenn to make her aware of potential in delayed results.

## 2020-11-21 NOTE — Telephone Encounter (Signed)
Patient called today wanting to let you know  she took her medication yesterday and was supposed to get her bloodwork today; however, [facility] [employer] could not give her a ride to come do the bloodwork.  The soonest she would be able to get the bloodwork done is on November 3.

## 2020-11-27 ENCOUNTER — Other Ambulatory Visit (INDEPENDENT_AMBULATORY_CARE_PROVIDER_SITE_OTHER): Payer: Medicare Other

## 2020-11-27 DIAGNOSIS — N390 Urinary tract infection, site not specified: Secondary | ICD-10-CM

## 2020-11-27 DIAGNOSIS — Z792 Long term (current) use of antibiotics: Secondary | ICD-10-CM | POA: Diagnosis not present

## 2020-11-27 DIAGNOSIS — N184 Chronic kidney disease, stage 4 (severe): Secondary | ICD-10-CM

## 2020-11-27 LAB — COMPREHENSIVE METABOLIC PANEL
ALT: 30 U/L (ref 0–35)
AST: 44 U/L — ABNORMAL HIGH (ref 0–37)
Albumin: 2.7 g/dL — ABNORMAL LOW (ref 3.5–5.2)
Alkaline Phosphatase: 94 U/L (ref 39–117)
BUN: 14 mg/dL (ref 6–23)
CO2: 26 mEq/L (ref 19–32)
Calcium: 8 mg/dL — ABNORMAL LOW (ref 8.4–10.5)
Chloride: 101 mEq/L (ref 96–112)
Creatinine, Ser: 1.31 mg/dL — ABNORMAL HIGH (ref 0.40–1.20)
GFR: 39.6 mL/min — ABNORMAL LOW (ref >60.00)
Glucose, Bld: 76 mg/dL (ref 70–99)
Potassium: 3.7 mEq/L (ref 3.5–5.1)
Sodium: 131 mEq/L — ABNORMAL LOW (ref 135–145)
Total Bilirubin: 0.8 mg/dL (ref 0.2–1.2)
Total Protein: 5.7 g/dL — ABNORMAL LOW (ref 6.0–8.3)

## 2020-12-08 ENCOUNTER — Other Ambulatory Visit (INDEPENDENT_AMBULATORY_CARE_PROVIDER_SITE_OTHER): Payer: Medicare Other

## 2020-12-08 DIAGNOSIS — N184 Chronic kidney disease, stage 4 (severe): Secondary | ICD-10-CM | POA: Diagnosis not present

## 2020-12-08 DIAGNOSIS — N390 Urinary tract infection, site not specified: Secondary | ICD-10-CM | POA: Diagnosis not present

## 2020-12-08 DIAGNOSIS — Z792 Long term (current) use of antibiotics: Secondary | ICD-10-CM | POA: Diagnosis not present

## 2020-12-08 LAB — COMPREHENSIVE METABOLIC PANEL
ALT: 24 U/L (ref 0–35)
AST: 37 U/L (ref 0–37)
Albumin: 2.5 g/dL — ABNORMAL LOW (ref 3.5–5.2)
Alkaline Phosphatase: 94 U/L (ref 39–117)
BUN: 13 mg/dL (ref 6–23)
CO2: 24 mEq/L (ref 19–32)
Calcium: 8 mg/dL — ABNORMAL LOW (ref 8.4–10.5)
Chloride: 103 mEq/L (ref 96–112)
Creatinine, Ser: 1.1 mg/dL (ref 0.40–1.20)
GFR: 48.83 mL/min — ABNORMAL LOW (ref >60.00)
Glucose, Bld: 82 mg/dL (ref 70–99)
Potassium: 3.5 mEq/L (ref 3.5–5.1)
Sodium: 132 mEq/L — ABNORMAL LOW (ref 135–145)
Total Bilirubin: 1 mg/dL (ref 0.2–1.2)
Total Protein: 5.6 g/dL — ABNORMAL LOW (ref 6.0–8.3)

## 2020-12-09 ENCOUNTER — Other Ambulatory Visit: Payer: Self-pay

## 2020-12-09 DIAGNOSIS — K7469 Other cirrhosis of liver: Secondary | ICD-10-CM

## 2020-12-09 DIAGNOSIS — N184 Chronic kidney disease, stage 4 (severe): Secondary | ICD-10-CM

## 2020-12-09 DIAGNOSIS — R7989 Other specified abnormal findings of blood chemistry: Secondary | ICD-10-CM

## 2020-12-09 DIAGNOSIS — R748 Abnormal levels of other serum enzymes: Secondary | ICD-10-CM

## 2020-12-12 ENCOUNTER — Other Ambulatory Visit: Payer: Self-pay | Admitting: Gastroenterology

## 2020-12-15 ENCOUNTER — Other Ambulatory Visit: Payer: Self-pay

## 2020-12-16 ENCOUNTER — Ambulatory Visit (INDEPENDENT_AMBULATORY_CARE_PROVIDER_SITE_OTHER): Payer: Medicare Other | Admitting: Family Medicine

## 2020-12-16 ENCOUNTER — Encounter: Payer: Self-pay | Admitting: Family Medicine

## 2020-12-16 VITALS — BP 120/68 | HR 100 | Temp 97.4°F | Ht 69.0 in | Wt 177.0 lb

## 2020-12-16 DIAGNOSIS — H9193 Unspecified hearing loss, bilateral: Secondary | ICD-10-CM | POA: Diagnosis not present

## 2020-12-16 DIAGNOSIS — H6122 Impacted cerumen, left ear: Secondary | ICD-10-CM

## 2020-12-16 DIAGNOSIS — E039 Hypothyroidism, unspecified: Secondary | ICD-10-CM | POA: Diagnosis not present

## 2020-12-16 DIAGNOSIS — E871 Hypo-osmolality and hyponatremia: Secondary | ICD-10-CM | POA: Diagnosis not present

## 2020-12-16 NOTE — Progress Notes (Addendum)
Established Patient Office Visit  Subjective:  Patient ID: Stacey Velazquez, female    DOB: November 27, 1945  Age: 75 y.o. MRN: [id number]  CC:  Chief Complaint  Patient presents with   Follow-up    1 month follow up, no appetite stomach pains, little vomiting yesterday. Patient would like ears cleaned out unable to hear left ear.    HPI [name] presents for follow-up of hypothyroidism, CKD, hyponatremia.  Tells that she drank some Lactaid free milk yesterday and experienced nausea with vomiting.  That is since cleared.  Reports that she takes her thyroid tablet daily.  Has increased her fluid intake to 64 ounces daily.  Recent CMP showed marked improvement of her GFR.  Continues to produce urine.  She is semi ambulatory.  Past Medical History:  Diagnosis Date   Adenomatous polyp of ascending colon    Anxiety    Bilateral lower extremity edema    burning sensations   Bipolar 1 disorder (HCC)    Carpal tunnel syndrome    Cirrhosis of liver (HCC)    CKD (chronic kidney disease) stage 4, GFR 15-29 ml/min (HCC) 11/04/2020   Cutaneous horn    Drug-induced parkinsonism ([place])    patient unaware   Esophageal varices (HCC)    Fibromyalgia    patient denies   Gastric ulcer    GERD (gastroesophageal reflux disease)    Gingival abscess 08/26/2016   Glaucoma 2003   pt unaware   Heart attack ([place])    Heart disease    Hepatitis, autoimmune ([place]) 08/25/2014   History of iron deficiency anemia    Hypotension    Hypothyroidism    Migraines    Neutropenia (HCC)    Osteoporosis    Pacemaker Medtronic    MRI compatible   Paranoid schizophrenia ([place])    Polyarthritis    Polyosteoarthritis    Rectal mass    Seizures ([place])    last sz 08/21/14   Sinus arrest    Skin cancer    forehead   SSS (sick sinus syndrome) ([place])    Stroke ([place])    Thrombocytopenia ([place])    Vertigo    bvvp    Past Surgical History:  Procedure Laterality Date   BIOPSY  03/06/2019   Procedure: BIOPSY;  Surgeon:  Thornton Park, MD;  Location: [facility];  Service: Gastroenterology;;  EGD and COLON   BIOPSY  04/15/2020   Procedure: BIOPSY;  Surgeon: Thornton Park, MD;  Location: [facility];  Service: Gastroenterology;;   CATARACT EXTRACTION Left 2008   pt unaware   CHOLECYSTECTOMY     COLONOSCOPY WITH PROPOFOL N/A 03/06/2019   Procedure: COLONOSCOPY WITH PROPOFOL;  Surgeon: Thornton Park, MD;  Location: [facility];  Service: Gastroenterology;  Laterality: N/A;   ESOPHAGOGASTRODUODENOSCOPY (EGD) WITH PROPOFOL N/A 03/06/2019   Procedure: ESOPHAGOGASTRODUODENOSCOPY (EGD) WITH PROPOFOL ;  Surgeon: Thornton Park, MD;  Location: [facility];  Service: Gastroenterology;  Laterality: N/A;   ESOPHAGOGASTRODUODENOSCOPY (EGD) WITH PROPOFOL N/A 04/15/2020   Procedure: ESOPHAGOGASTRODUODENOSCOPY (EGD) WITH PROPOFOL;  Surgeon: Thornton Park, MD;  Location: [facility];  Service: Gastroenterology;  Laterality: N/A;   KNEE SURGERY Left 1985   PACEMAKER INSERTION  2014   PARTIAL HYSTERECTOMY  1979   PARTIAL PROCTECTOMY BY TEM N/A 05/10/2019   Procedure: TEM PARTIAL PROCTECTOMY OF RECTAL MASS WITH EXCISION RIGHT PERINEAL SKIN MASS;  Surgeon: Michael Boston, MD;  Location: [facility];  Service: General;  Laterality: N/A;   POLYPECTOMY  03/06/2019   Procedure: POLYPECTOMY;  Surgeon: Thornton Park, MD;  Location: [facility];  Service: Gastroenterology;;   REFRACTIVE SURGERY     TUBAL LIGATION      Family History  Problem Relation Age of Onset   Heart attack Father    Heart disease Paternal Uncle    Heart disease Paternal Grandmother    Diabetes Paternal Grandmother    Heart disease Paternal Grandfather    Breast cancer Mother    Cancer Maternal Grandmother    CVA Maternal Grandmother    Cancer Maternal Grandfather    CVA Maternal Grandfather    Diabetes Sister    Diabetes Brother    Diabetes Brother    Stroke Daughter     Social History   Socioeconomic History   Marital status:  Widowed    Spouse name: Not on file   Number of children: Not on file   Years of education: Not on file   Highest education level: Not on file  Occupational History   Occupation: disable  Tobacco Use   Smoking status: Former    Types: Cigarettes    Quit date: 03/02/2003    Years since quitting: 17.8   Smokeless tobacco: Never  Vaping Use   Vaping Use: Never used  Substance and Sexual Activity   Alcohol use: No   Drug use: No   Sexual activity: Not on file  Other Topics Concern   Not on file  Social History Narrative   Lives alone, "have friends and relatives that come and help me"   caffeine use- coffee -1 cup daily   Social Determinants of Health   Financial Resource Strain: Not on file  Food Insecurity: Not on file  Transportation Needs: Not on file  Physical Activity: Not on file  Stress: Not on file  Social Connections: Not on file  Intimate Partner Violence: Not on file    Outpatient Medications Prior to Visit  Medication Sig Dispense Refill   dexlansoprazole (DEXILANT) 60 MG capsule Take 1 capsule (60 mg total) by mouth daily. 90 capsule 3   famotidine (PEPCID) 20 MG tablet Take 1 tablet (20 mg total) by mouth 2 (two) times daily. 180 tablet 0   linaclotide (LINZESS) 145 MCG CAPS capsule Take 1 capsule (145 mcg total) by mouth daily before breakfast. 30 capsule 3   Probiotic Product (DIGESTIVE ADV DIGESTIVE/IMMUNE) CHEW Chew 2 capsules by mouth at bedtime.     spironolactone (ALDACTONE) 25 MG tablet TAKE 1/2 TABLET (12.5 MG TOTAL) BY MOUTH DAILY. 45 tablet 0   tacrolimus (PROGRAF) 1 MG capsule Take 2 capsules (2 mg total) by mouth daily. ON HOLD D/T RENAL FUNCTION 60 capsule 3   tamsulosin (FLOMAX) 0.4 MG CAPS capsule Take 0.4 mg by mouth at bedtime.     levothyroxine (SYNTHROID) 75 MCG tablet Take 1 tablet (75 mcg total) by mouth daily before breakfast. 90 tablet 1   No facility-administered medications prior to visit.    Allergies  Allergen Reactions    Neomycin Nausea And Vomiting   Ambien [Zolpidem Tartrate] Other (See Comments)    Hallucinations    Amitriptyline Other (See Comments)    Stops heart per pt   Ampicillin Other (See Comments)    Stops heart per pt    Anaprox [Naproxen Sodium] Other (See Comments)    Doesn't work per pt   Benadryl [Diphenhydramine Hcl (Sleep)] Other (See Comments)    Rash   Cetirizine & Related Other (See Comments)    Swelling    Cortizone-10 [Hydrocortisone] Other (See  Comments)    seizures    Darvon [Propoxyphene] Other (See Comments)    Increased heart rate per pt    Diazepam Other (See Comments)    Stops heart per pt   Diflunisal Swelling and Other (See Comments)    Stops heart per pt (Dolobid)   Duloxetine Other (See Comments)    Reaction to Cymbalta - pt doesn't remember what the reaction was   Flexeril [Cyclobenzaprine] Other (See Comments)    Seizures    Lactose Intolerance (Gi) Swelling and Other (See Comments)    cramping   Lidocaine Other (See Comments)    Seizures   Meperidine And Related Other (See Comments)    Stops heart rate (reaction to Demerol)   Metanx [L-Methylfolate-Algae-B12-B6] Other (See Comments)    Pt does not remember reaction   Metoclopramide Other (See Comments)    Pt does not remember reaction   Morphine And Related Other (See Comments)    Stops heart per pt    Nuprin [Ibuprofen] Other (See Comments)    Caused headache   Oxycodone Other (See Comments)    Hallucinations    Penicillins Other (See Comments)    Stops heart per pt    Propoxyphene Other (See Comments)    Slowed heart rate per pt (reaction to Darvocet)   Ranitidine Other (See Comments)    Pt does not remember reaction   Vicodin [Hydrocodone-Acetaminophen] Other (See Comments)    Seizures    Tylenol [Acetaminophen] Rash    ROS Review of Systems  Constitutional:  Negative for chills, diaphoresis, fatigue, fever and unexpected weight change.  HENT:  Positive for hearing loss. Negative  for ear discharge and ear pain.   Eyes:  Negative for photophobia and visual disturbance.  Respiratory: Negative.    Cardiovascular: Negative.   Gastrointestinal:  Positive for constipation.  Genitourinary:  Negative for difficulty urinating, dysuria and hematuria.  Musculoskeletal:  Positive for gait problem.  Neurological:  Negative for speech difficulty and light-headedness.     Objective:    Physical Exam Vitals reviewed.  Constitutional:      General: She is not in acute distress.    Appearance: Normal appearance. She is not ill-appearing, toxic-appearing or diaphoretic.  HENT:     Head: Normocephalic and atraumatic.     Right Ear: Tympanic membrane, ear canal and external ear normal. There is no impacted cerumen.     Left Ear: Tympanic membrane, ear canal and external ear normal. There is no impacted cerumen.  Eyes:     General:        Right eye: No discharge.        Left eye: No discharge.     Extraocular Movements: Extraocular movements intact.     Conjunctiva/sclera: Conjunctivae normal.  Cardiovascular:     Rate and Rhythm: Normal rate and regular rhythm.  Pulmonary:     Effort: Pulmonary effort is normal.     Breath sounds: Normal breath sounds.  Abdominal:     General: Bowel sounds are normal.  Musculoskeletal:     Right lower leg: Edema present.  Skin:    General: Skin is warm and dry.  Neurological:     Mental Status: She is alert. Mental status is at baseline.  Psychiatric:        Mood and Affect: Mood normal.        Behavior: Behavior normal.   Subjective:    TYREA FROBERG is a 75 y.o. female whom I am asked to see for  evaluation of diminished hearing in the left ear for the past 4 months. There is a prior history of cerumen impaction. The patient has not been using ear drops to loosen wax immediately prior to this visit. The patient denies ear pain.  She gave verbal consent.  The patient's history has been marked as reviewed and updated as  appropriate.  Review of Systems Pertinent items are noted in HPI.    Objective:    Auditory canal(s) of the left ear are partially obstructed with cerumen.   Cerumen was removed using gentle irrigation. Tympanic membranes are intact following the procedure.  Auditory canals are normal.    Assessment:    Cerumen Impaction without otitis externa.  Patient tolerated procedure well.  Seem to help improve her hearing.   Plan:    1. Care instructions given. 2. Home treatment: none. 3. Follow-up as needed.    BP 120/68 (BP Location: Left Arm, Patient Position: Sitting, Cuff Size: Normal)   Pulse 100   Temp (!) 97.4 F (36.3 C) (Temporal)   Ht 5\' 9"  (1.753 m)   Wt 177 lb (80.3 kg)   SpO2 100%   BMI 26.14 kg/m  Wt Readings from Last 3 Encounters:  12/16/20 177 lb (80.3 kg)  11/04/20 158 lb (71.7 kg)  10/28/20 158 lb (71.7 kg)     Health Maintenance Due  Topic Date Due   Hepatitis C Screening  Never done   Zoster Vaccines- Shingrix (1 of 2) Never done   DEXA SCAN  Never done   Pneumonia Vaccine 28+ Years old (2 - PPSV23 if available, else PCV20) 04/19/2018    There are no preventive care reminders to display for this patient.  Lab Results  Component Value Date   TSH 0.17 (L) 12/16/2020   Lab Results  Component Value Date   WBC 1.5 Repeated and verified X2. (LL) 10/28/2020   HGB 9.1 (L) 10/28/2020   HCT 26.6 (L) 10/28/2020   MCV 92.7 10/28/2020   PLT 64.0 Repeated and verified X2. (L) 10/28/2020   Lab Results  Component Value Date   NA 132 (L) 12/08/2020   K 3.5 12/08/2020   CO2 24 12/08/2020   GLUCOSE 82 12/08/2020   BUN 13 12/08/2020   CREATININE 1.10 12/08/2020   BILITOT 1.0 12/08/2020   ALKPHOS 94 12/08/2020   AST 37 12/08/2020   ALT 24 12/08/2020   PROT 5.6 (L) 12/08/2020   ALBUMIN 2.5 (L) 12/08/2020   CALCIUM 8.0 (L) 12/08/2020   ANIONGAP 7 06/20/2020   GFR 48.83 (L) 12/08/2020   No results found for: CHOL No results found for: HDL No results  found for: LDLCALC No results found for: TRIG No results found for: CHOLHDL Lab Results  Component Value Date   HGBA1C 5.1 05/08/2019      Assessment & Plan:   Problem List Items Addressed This Visit       Endocrine   Hypothyroidism (Chronic)   Relevant Medications   levothyroxine (SYNTHROID) 50 MCG tablet   Other Relevant Orders   TSH (Completed)     Nervous and Auditory   Bilateral hearing loss   Ceruminosis, left     Other   Hyponatremia - Primary   Relevant Orders   Sodium, urine, random    Meds ordered this encounter  Medications   levothyroxine (SYNTHROID) 50 MCG tablet    Sig: Take 1 tablet (50 mcg total) by mouth daily.    Dispense:  90 tablet    Refill:  1     Follow-up: Return in about 3 months (around 03/18/2021), or if symptoms worsen or fail to improve.  Patient has had difficulty supplying urine samples for urine sodium.  Hopefully she will be able to do so today.  Libby Maw, MD  11/22 addendum: unable to urinate this afternoon.  11/23 addendum: Hypothyroidism appears to be over replaced.  Have decreased levothyroxine to 50 mcg in morning prior to eating.

## 2020-12-17 LAB — TSH: TSH: 0.17 u[IU]/mL — ABNORMAL LOW (ref 0.35–5.50)

## 2020-12-17 MED ORDER — LEVOTHYROXINE SODIUM 50 MCG PO TABS: 50.0000 ug | ORAL_TABLET | Freq: Every day | ORAL | 1 refills | Status: AC

## 2020-12-17 NOTE — Addendum Note (Signed)
Addended by: Jon Billings on: 12/17/2020 11:32 AM   Modules accepted: Orders

## 2020-12-22 ENCOUNTER — Telehealth: Payer: Self-pay | Admitting: Family Medicine

## 2020-12-22 NOTE — Telephone Encounter (Signed)
Returned patients call, no answer LMTCB 

## 2020-12-24 NOTE — Telephone Encounter (Signed)
Spoke with patient, went over labs

## 2020-12-25 DIAGNOSIS — D044 Carcinoma in situ of skin of scalp and neck: Secondary | ICD-10-CM | POA: Diagnosis not present

## 2020-12-25 DIAGNOSIS — L814 Other melanin hyperpigmentation: Secondary | ICD-10-CM | POA: Diagnosis not present

## 2020-12-25 DIAGNOSIS — D2239 Melanocytic nevi of other parts of face: Secondary | ICD-10-CM | POA: Diagnosis not present

## 2020-12-25 DIAGNOSIS — D0439 Carcinoma in situ of skin of other parts of face: Secondary | ICD-10-CM | POA: Diagnosis not present

## 2020-12-25 DIAGNOSIS — D1801 Hemangioma of skin and subcutaneous tissue: Secondary | ICD-10-CM | POA: Diagnosis not present

## 2020-12-25 DIAGNOSIS — D485 Neoplasm of uncertain behavior of skin: Secondary | ICD-10-CM | POA: Diagnosis not present

## 2020-12-25 DIAGNOSIS — L821 Other seborrheic keratosis: Secondary | ICD-10-CM | POA: Diagnosis not present

## 2020-12-26 ENCOUNTER — Other Ambulatory Visit: Payer: Self-pay

## 2020-12-26 ENCOUNTER — Inpatient Hospital Stay: Payer: Medicare Other | Attending: Hematology and Oncology

## 2020-12-26 DIAGNOSIS — D702 Other drug-induced agranulocytosis: Secondary | ICD-10-CM

## 2020-12-26 DIAGNOSIS — D696 Thrombocytopenia, unspecified: Secondary | ICD-10-CM | POA: Insufficient documentation

## 2020-12-26 LAB — CBC WITH DIFFERENTIAL (CANCER CENTER ONLY)
Abs Immature Granulocytes: 0.01 10*3/uL (ref 0.00–0.07)
Basophils Absolute: 0 10*3/uL (ref 0.0–0.1)
Basophils Relative: 1 %
Eosinophils Absolute: 0 10*3/uL (ref 0.0–0.5)
Eosinophils Relative: 1 %
HCT: 25.6 % — ABNORMAL LOW (ref 36.0–46.0)
Hemoglobin: 9 g/dL — ABNORMAL LOW (ref 12.0–15.0)
Immature Granulocytes: 1 %
Lymphocytes Relative: 35 %
Lymphs Abs: 0.5 10*3/uL — ABNORMAL LOW (ref 0.7–4.0)
MCH: 31.5 pg (ref 26.0–34.0)
MCHC: 35.2 g/dL (ref 30.0–36.0)
MCV: 89.5 fL (ref 80.0–100.0)
Monocytes Absolute: 0.2 10*3/uL (ref 0.1–1.0)
Monocytes Relative: 14 %
Neutro Abs: 0.7 10*3/uL — ABNORMAL LOW (ref 1.7–7.7)
Neutrophils Relative %: 48 %
Platelet Count: 56 10*3/uL — ABNORMAL LOW (ref 150–400)
RBC: 2.86 MIL/uL — ABNORMAL LOW (ref 3.87–5.11)
RDW: 13.6 % (ref 11.5–15.5)
WBC Count: 1.5 10*3/uL — ABNORMAL LOW (ref 4.0–10.5)
nRBC: 0 % (ref 0.0–0.2)

## 2021-01-08 ENCOUNTER — Other Ambulatory Visit (INDEPENDENT_AMBULATORY_CARE_PROVIDER_SITE_OTHER): Payer: Medicare Other

## 2021-01-08 ENCOUNTER — Other Ambulatory Visit: Payer: Self-pay

## 2021-01-08 ENCOUNTER — Telehealth: Payer: Self-pay

## 2021-01-08 DIAGNOSIS — T50905A Adverse effect of unspecified drugs, medicaments and biological substances, initial encounter: Secondary | ICD-10-CM | POA: Diagnosis not present

## 2021-01-08 DIAGNOSIS — D709 Neutropenia, unspecified: Secondary | ICD-10-CM | POA: Diagnosis not present

## 2021-01-08 DIAGNOSIS — I8500 Esophageal varices without bleeding: Secondary | ICD-10-CM | POA: Diagnosis not present

## 2021-01-08 DIAGNOSIS — K7469 Other cirrhosis of liver: Secondary | ICD-10-CM | POA: Diagnosis not present

## 2021-01-08 DIAGNOSIS — K862 Cyst of pancreas: Secondary | ICD-10-CM | POA: Diagnosis not present

## 2021-01-08 DIAGNOSIS — R748 Abnormal levels of other serum enzymes: Secondary | ICD-10-CM | POA: Diagnosis not present

## 2021-01-08 DIAGNOSIS — D6959 Other secondary thrombocytopenia: Secondary | ICD-10-CM

## 2021-01-08 DIAGNOSIS — R7989 Other specified abnormal findings of blood chemistry: Secondary | ICD-10-CM

## 2021-01-08 DIAGNOSIS — K754 Autoimmune hepatitis: Secondary | ICD-10-CM

## 2021-01-08 DIAGNOSIS — N184 Chronic kidney disease, stage 4 (severe): Secondary | ICD-10-CM

## 2021-01-08 DIAGNOSIS — D61818 Other pancytopenia: Secondary | ICD-10-CM

## 2021-01-08 DIAGNOSIS — D691 Qualitative platelet defects: Secondary | ICD-10-CM

## 2021-01-08 LAB — COMPREHENSIVE METABOLIC PANEL
ALT: 29 U/L (ref 0–35)
AST: 49 U/L — ABNORMAL HIGH (ref 0–37)
Albumin: 2.2 g/dL — ABNORMAL LOW (ref 3.5–5.2)
Alkaline Phosphatase: 110 U/L (ref 39–117)
BUN: 15 mg/dL (ref 6–23)
CO2: 22 mEq/L (ref 19–32)
Calcium: 8 mg/dL — ABNORMAL LOW (ref 8.4–10.5)
Chloride: 104 mEq/L (ref 96–112)
Creatinine, Ser: 1.18 mg/dL (ref 0.40–1.20)
GFR: 44.85 mL/min — ABNORMAL LOW (ref >60.00)
Glucose, Bld: 82 mg/dL (ref 70–99)
Potassium: 4 mEq/L (ref 3.5–5.1)
Sodium: 133 mEq/L — ABNORMAL LOW (ref 135–145)
Total Bilirubin: 1.2 mg/dL (ref 0.2–1.2)
Total Protein: 6.3 g/dL (ref 6.0–8.3)

## 2021-01-08 LAB — CBC
HCT: 31.4 % — ABNORMAL LOW (ref 36.0–46.0)
Hemoglobin: 10.7 g/dL — ABNORMAL LOW (ref 12.0–15.0)
MCHC: 34.1 g/dL (ref 30.0–36.0)
MCV: 90.8 fl (ref 78.0–100.0)
Platelets: 26 10*3/uL — CL (ref 150.0–400.0)
RBC: 3.46 Mil/uL — ABNORMAL LOW (ref 3.87–5.11)
RDW: 14.4 % (ref 11.5–15.5)
WBC: 2.3 10*3/uL — ABNORMAL LOW (ref 4.0–10.5)

## 2021-01-08 LAB — HEPATIC FUNCTION PANEL
ALT: 29 U/L (ref 0–35)
AST: 49 U/L — ABNORMAL HIGH (ref 0–37)
Albumin: 2.2 g/dL — ABNORMAL LOW (ref 3.5–5.2)
Alkaline Phosphatase: 110 U/L (ref 39–117)
Bilirubin, Direct: 0.4 mg/dL — ABNORMAL HIGH (ref 0.0–0.3)
Total Bilirubin: 1.2 mg/dL (ref 0.2–1.2)
Total Protein: 6.3 g/dL (ref 6.0–8.3)

## 2021-01-08 NOTE — Telephone Encounter (Signed)
Chart reviewed.  Has chronic pancytopenia.  White blood cell count and hemoglobin are slightly better than last check 2 weeks ago.  Platelets are worse.  Repeat CBC in 1 week.  Further recommendations per Dr. Tarri Glenn

## 2021-01-08 NOTE — Telephone Encounter (Signed)
Called pt and informed of results and recommendations as reviewed and documented by DOD, Dr. Henrene Pastor. Verbalized acceptance and understanding. Orders for repeat CBC placed. Pt to arrange for transportation so labs can be obtained next week. Will await results. Routing this message to Dr. Tarri Glenn for continuity of care.

## 2021-01-08 NOTE — Telephone Encounter (Signed)
Received call from Santiago Glad in the lab to provide critical PLATELET results of 26. Routing this critical result to DOD, Dr. Henrene Pastor for him to review and advise. Dr. Tarri Glenn out of the office.

## 2021-01-09 ENCOUNTER — Encounter: Payer: Self-pay | Admitting: Nephrology

## 2021-01-09 ENCOUNTER — Other Ambulatory Visit: Payer: Self-pay | Admitting: Internal Medicine

## 2021-01-09 ENCOUNTER — Telehealth: Payer: Self-pay | Admitting: Gastroenterology

## 2021-01-09 DIAGNOSIS — E8770 Fluid overload, unspecified: Secondary | ICD-10-CM | POA: Diagnosis not present

## 2021-01-09 DIAGNOSIS — E871 Hypo-osmolality and hyponatremia: Secondary | ICD-10-CM

## 2021-01-09 DIAGNOSIS — K754 Autoimmune hepatitis: Secondary | ICD-10-CM | POA: Diagnosis not present

## 2021-01-09 DIAGNOSIS — N179 Acute kidney failure, unspecified: Secondary | ICD-10-CM | POA: Diagnosis not present

## 2021-01-09 LAB — IGG: IgG (Immunoglobin G), Serum: 3077 mg/dL — ABNORMAL HIGH (ref 600–1540)

## 2021-01-09 NOTE — Telephone Encounter (Signed)
Patient called and wanted to inform you she went to see Dr. Candiss Norse at [facility] today and she has changes she'd like to discuss with you.  She also has a return appointment with Dr. Candiss Norse in about a month.  Thank you.

## 2021-01-09 NOTE — Telephone Encounter (Signed)
Returned pt call. States she saw Dr. Candiss Norse today @ [facility]. Started pt on Lasix 20mg  qd for fluid retention. Pt scheduled to f/u on 02/12/21. Records from today's OV to be faxed to our office once completed.

## 2021-01-09 NOTE — Telephone Encounter (Signed)
MAR updated to reflect the addition of Lasix.

## 2021-01-12 ENCOUNTER — Emergency Department (HOSPITAL_COMMUNITY): Payer: Medicare Other

## 2021-01-12 ENCOUNTER — Telehealth: Payer: Self-pay | Admitting: Gastroenterology

## 2021-01-12 ENCOUNTER — Observation Stay (HOSPITAL_COMMUNITY): Payer: Medicare Other

## 2021-01-12 ENCOUNTER — Other Ambulatory Visit: Payer: Self-pay

## 2021-01-12 ENCOUNTER — Inpatient Hospital Stay (HOSPITAL_COMMUNITY)
Admission: EM | Admit: 2021-01-12 | Discharge: 2021-01-23 | DRG: 441 | Disposition: A | Discharge status: Skilled Nursing Facility | Payer: Medicare Other | Attending: Internal Medicine | Admitting: Internal Medicine

## 2021-01-12 ENCOUNTER — Encounter (HOSPITAL_COMMUNITY): Payer: Self-pay | Admitting: Emergency Medicine

## 2021-01-12 DIAGNOSIS — H532 Diplopia: Secondary | ICD-10-CM | POA: Diagnosis present

## 2021-01-12 DIAGNOSIS — D62 Acute posthemorrhagic anemia: Secondary | ICD-10-CM | POA: Diagnosis present

## 2021-01-12 DIAGNOSIS — K5900 Constipation, unspecified: Secondary | ICD-10-CM | POA: Diagnosis present

## 2021-01-12 DIAGNOSIS — K754 Autoimmune hepatitis: Principal | ICD-10-CM | POA: Diagnosis present

## 2021-01-12 DIAGNOSIS — N281 Cyst of kidney, acquired: Secondary | ICD-10-CM | POA: Diagnosis not present

## 2021-01-12 DIAGNOSIS — E039 Hypothyroidism, unspecified: Secondary | ICD-10-CM | POA: Diagnosis present

## 2021-01-12 DIAGNOSIS — I2510 Atherosclerotic heart disease of native coronary artery without angina pectoris: Secondary | ICD-10-CM | POA: Diagnosis present

## 2021-01-12 DIAGNOSIS — Z79890 Hormone replacement therapy (postmenopausal): Secondary | ICD-10-CM

## 2021-01-12 DIAGNOSIS — K2971 Gastritis, unspecified, with bleeding: Secondary | ICD-10-CM | POA: Diagnosis present

## 2021-01-12 DIAGNOSIS — N179 Acute kidney failure, unspecified: Secondary | ICD-10-CM | POA: Diagnosis not present

## 2021-01-12 DIAGNOSIS — I8510 Secondary esophageal varices without bleeding: Secondary | ICD-10-CM | POA: Diagnosis present

## 2021-01-12 DIAGNOSIS — K8689 Other specified diseases of pancreas: Secondary | ICD-10-CM | POA: Diagnosis not present

## 2021-01-12 DIAGNOSIS — I252 Old myocardial infarction: Secondary | ICD-10-CM

## 2021-01-12 DIAGNOSIS — K7290 Hepatic failure, unspecified without coma: Secondary | ICD-10-CM | POA: Diagnosis not present

## 2021-01-12 DIAGNOSIS — R2681 Unsteadiness on feet: Secondary | ICD-10-CM | POA: Diagnosis not present

## 2021-01-12 DIAGNOSIS — F319 Bipolar disorder, unspecified: Secondary | ICD-10-CM | POA: Diagnosis not present

## 2021-01-12 DIAGNOSIS — I8511 Secondary esophageal varices with bleeding: Secondary | ICD-10-CM | POA: Diagnosis not present

## 2021-01-12 DIAGNOSIS — K2941 Chronic atrophic gastritis with bleeding: Secondary | ICD-10-CM | POA: Diagnosis not present

## 2021-01-12 DIAGNOSIS — Z79899 Other long term (current) drug therapy: Secondary | ICD-10-CM

## 2021-01-12 DIAGNOSIS — Z86010 Personal history of colonic polyps: Secondary | ICD-10-CM

## 2021-01-12 DIAGNOSIS — N1831 Chronic kidney disease, stage 3a: Secondary | ICD-10-CM | POA: Diagnosis present

## 2021-01-12 DIAGNOSIS — Z66 Do not resuscitate: Secondary | ICD-10-CM | POA: Diagnosis not present

## 2021-01-12 DIAGNOSIS — K648 Other hemorrhoids: Secondary | ICD-10-CM | POA: Diagnosis not present

## 2021-01-12 DIAGNOSIS — K766 Portal hypertension: Secondary | ICD-10-CM | POA: Diagnosis present

## 2021-01-12 DIAGNOSIS — I81 Portal vein thrombosis: Secondary | ICD-10-CM | POA: Diagnosis not present

## 2021-01-12 DIAGNOSIS — I8500 Esophageal varices without bleeding: Secondary | ICD-10-CM | POA: Diagnosis present

## 2021-01-12 DIAGNOSIS — D689 Coagulation defect, unspecified: Secondary | ICD-10-CM | POA: Diagnosis not present

## 2021-01-12 DIAGNOSIS — D61818 Other pancytopenia: Secondary | ICD-10-CM | POA: Diagnosis not present

## 2021-01-12 DIAGNOSIS — Z823 Family history of stroke: Secondary | ICD-10-CM

## 2021-01-12 DIAGNOSIS — T451X5A Adverse effect of antineoplastic and immunosuppressive drugs, initial encounter: Secondary | ICD-10-CM | POA: Diagnosis present

## 2021-01-12 DIAGNOSIS — R42 Dizziness and giddiness: Secondary | ICD-10-CM | POA: Diagnosis not present

## 2021-01-12 DIAGNOSIS — D500 Iron deficiency anemia secondary to blood loss (chronic): Secondary | ICD-10-CM | POA: Diagnosis not present

## 2021-01-12 DIAGNOSIS — R161 Splenomegaly, not elsewhere classified: Secondary | ICD-10-CM | POA: Diagnosis not present

## 2021-01-12 DIAGNOSIS — R140 Abdominal distension (gaseous): Secondary | ICD-10-CM

## 2021-01-12 DIAGNOSIS — D6481 Anemia due to antineoplastic chemotherapy: Secondary | ICD-10-CM | POA: Diagnosis not present

## 2021-01-12 DIAGNOSIS — R5381 Other malaise: Secondary | ICD-10-CM | POA: Diagnosis not present

## 2021-01-12 DIAGNOSIS — G9341 Metabolic encephalopathy: Secondary | ICD-10-CM | POA: Diagnosis present

## 2021-01-12 DIAGNOSIS — I129 Hypertensive chronic kidney disease with stage 1 through stage 4 chronic kidney disease, or unspecified chronic kidney disease: Secondary | ICD-10-CM | POA: Diagnosis present

## 2021-01-12 DIAGNOSIS — K7460 Unspecified cirrhosis of liver: Secondary | ICD-10-CM | POA: Diagnosis present

## 2021-01-12 DIAGNOSIS — Z955 Presence of coronary angioplasty implant and graft: Secondary | ICD-10-CM

## 2021-01-12 DIAGNOSIS — R1084 Generalized abdominal pain: Secondary | ICD-10-CM | POA: Diagnosis not present

## 2021-01-12 DIAGNOSIS — D702 Other drug-induced agranulocytosis: Secondary | ICD-10-CM | POA: Diagnosis present

## 2021-01-12 DIAGNOSIS — R109 Unspecified abdominal pain: Secondary | ICD-10-CM | POA: Diagnosis not present

## 2021-01-12 DIAGNOSIS — Z8249 Family history of ischemic heart disease and other diseases of the circulatory system: Secondary | ICD-10-CM

## 2021-01-12 DIAGNOSIS — D684 Acquired coagulation factor deficiency: Secondary | ICD-10-CM | POA: Diagnosis present

## 2021-01-12 DIAGNOSIS — Z888 Allergy status to other drugs, medicaments and biological substances status: Secondary | ICD-10-CM

## 2021-01-12 DIAGNOSIS — I959 Hypotension, unspecified: Secondary | ICD-10-CM | POA: Diagnosis not present

## 2021-01-12 DIAGNOSIS — K5730 Diverticulosis of large intestine without perforation or abscess without bleeding: Secondary | ICD-10-CM | POA: Diagnosis present

## 2021-01-12 DIAGNOSIS — R188 Other ascites: Secondary | ICD-10-CM | POA: Diagnosis present

## 2021-01-12 DIAGNOSIS — Z885 Allergy status to narcotic agent status: Secondary | ICD-10-CM

## 2021-01-12 DIAGNOSIS — Z8673 Personal history of transient ischemic attack (TIA), and cerebral infarction without residual deficits: Secondary | ICD-10-CM

## 2021-01-12 DIAGNOSIS — K2970 Gastritis, unspecified, without bleeding: Secondary | ICD-10-CM | POA: Diagnosis not present

## 2021-01-12 DIAGNOSIS — R531 Weakness: Secondary | ICD-10-CM | POA: Diagnosis not present

## 2021-01-12 DIAGNOSIS — Z515 Encounter for palliative care: Secondary | ICD-10-CM | POA: Diagnosis not present

## 2021-01-12 DIAGNOSIS — Z20822 Contact with and (suspected) exposure to covid-19: Secondary | ICD-10-CM | POA: Diagnosis present

## 2021-01-12 DIAGNOSIS — E871 Hypo-osmolality and hyponatremia: Secondary | ICD-10-CM | POA: Diagnosis not present

## 2021-01-12 DIAGNOSIS — K3189 Other diseases of stomach and duodenum: Secondary | ICD-10-CM | POA: Diagnosis present

## 2021-01-12 DIAGNOSIS — R41841 Cognitive communication deficit: Secondary | ICD-10-CM | POA: Diagnosis not present

## 2021-01-12 DIAGNOSIS — K7469 Other cirrhosis of liver: Secondary | ICD-10-CM | POA: Diagnosis present

## 2021-01-12 DIAGNOSIS — K635 Polyp of colon: Secondary | ICD-10-CM | POA: Diagnosis not present

## 2021-01-12 DIAGNOSIS — M6281 Muscle weakness (generalized): Secondary | ICD-10-CM | POA: Diagnosis not present

## 2021-01-12 DIAGNOSIS — Z87891 Personal history of nicotine dependence: Secondary | ICD-10-CM

## 2021-01-12 DIAGNOSIS — R410 Disorientation, unspecified: Secondary | ICD-10-CM | POA: Diagnosis not present

## 2021-01-12 DIAGNOSIS — I495 Sick sinus syndrome: Secondary | ICD-10-CM | POA: Diagnosis present

## 2021-01-12 DIAGNOSIS — R339 Retention of urine, unspecified: Secondary | ICD-10-CM | POA: Diagnosis not present

## 2021-01-12 DIAGNOSIS — K5731 Diverticulosis of large intestine without perforation or abscess with bleeding: Secondary | ICD-10-CM | POA: Diagnosis not present

## 2021-01-12 DIAGNOSIS — Z833 Family history of diabetes mellitus: Secondary | ICD-10-CM

## 2021-01-12 DIAGNOSIS — K2951 Unspecified chronic gastritis with bleeding: Secondary | ICD-10-CM | POA: Diagnosis not present

## 2021-01-12 DIAGNOSIS — Z85828 Personal history of other malignant neoplasm of skin: Secondary | ICD-10-CM

## 2021-01-12 DIAGNOSIS — Z880 Allergy status to penicillin: Secondary | ICD-10-CM

## 2021-01-12 DIAGNOSIS — K659 Peritonitis, unspecified: Secondary | ICD-10-CM | POA: Diagnosis not present

## 2021-01-12 DIAGNOSIS — Z743 Need for continuous supervision: Secondary | ICD-10-CM | POA: Diagnosis not present

## 2021-01-12 DIAGNOSIS — R7989 Other specified abnormal findings of blood chemistry: Secondary | ICD-10-CM | POA: Diagnosis not present

## 2021-01-12 DIAGNOSIS — I519 Heart disease, unspecified: Secondary | ICD-10-CM | POA: Diagnosis not present

## 2021-01-12 DIAGNOSIS — D649 Anemia, unspecified: Secondary | ICD-10-CM | POA: Diagnosis not present

## 2021-01-12 DIAGNOSIS — D125 Benign neoplasm of sigmoid colon: Secondary | ICD-10-CM | POA: Diagnosis not present

## 2021-01-12 DIAGNOSIS — Z803 Family history of malignant neoplasm of breast: Secondary | ICD-10-CM

## 2021-01-12 DIAGNOSIS — K219 Gastro-esophageal reflux disease without esophagitis: Secondary | ICD-10-CM | POA: Diagnosis present

## 2021-01-12 DIAGNOSIS — E8770 Fluid overload, unspecified: Secondary | ICD-10-CM | POA: Diagnosis not present

## 2021-01-12 DIAGNOSIS — K7689 Other specified diseases of liver: Secondary | ICD-10-CM | POA: Diagnosis not present

## 2021-01-12 DIAGNOSIS — R0602 Shortness of breath: Secondary | ICD-10-CM | POA: Diagnosis not present

## 2021-01-12 DIAGNOSIS — R262 Difficulty in walking, not elsewhere classified: Secondary | ICD-10-CM | POA: Diagnosis not present

## 2021-01-12 DIAGNOSIS — R0902 Hypoxemia: Secondary | ICD-10-CM | POA: Diagnosis not present

## 2021-01-12 DIAGNOSIS — F200 Paranoid schizophrenia: Secondary | ICD-10-CM | POA: Diagnosis not present

## 2021-01-12 DIAGNOSIS — Z950 Presence of cardiac pacemaker: Secondary | ICD-10-CM

## 2021-01-12 DIAGNOSIS — Z7189 Other specified counseling: Secondary | ICD-10-CM | POA: Diagnosis not present

## 2021-01-12 LAB — URINALYSIS, ROUTINE W REFLEX MICROSCOPIC
Bilirubin Urine: NEGATIVE
Glucose, UA: NEGATIVE mg/dL
Ketones, ur: NEGATIVE mg/dL
Leukocytes,Ua: NEGATIVE
Nitrite: NEGATIVE
Protein, ur: NEGATIVE mg/dL
Specific Gravity, Urine: 1.019 (ref 1.005–1.030)
pH: 5 (ref 5.0–8.0)

## 2021-01-12 LAB — COMPREHENSIVE METABOLIC PANEL
ALT: 37 U/L (ref 0–44)
AST: 56 U/L — ABNORMAL HIGH (ref 15–41)
Albumin: 2.1 g/dL — ABNORMAL LOW (ref 3.5–5.0)
Alkaline Phosphatase: 105 U/L (ref 38–126)
Anion gap: 3 — ABNORMAL LOW (ref 5–15)
BUN: 18 mg/dL (ref 8–23)
CO2: 22 mmol/L (ref 22–32)
Calcium: 7.8 mg/dL — ABNORMAL LOW (ref 8.9–10.3)
Chloride: 106 mmol/L (ref 98–111)
Creatinine, Ser: 1.22 mg/dL — ABNORMAL HIGH (ref 0.44–1.00)
GFR, Estimated: 46 mL/min — ABNORMAL LOW (ref >60)
Glucose, Bld: 99 mg/dL (ref 70–99)
Potassium: 4.1 mmol/L (ref 3.5–5.1)
Sodium: 131 mmol/L — ABNORMAL LOW (ref 135–145)
Total Bilirubin: 1.6 mg/dL — ABNORMAL HIGH (ref 0.3–1.2)
Total Protein: 6.4 g/dL — ABNORMAL LOW (ref 6.5–8.1)

## 2021-01-12 LAB — CBC WITH DIFFERENTIAL/PLATELET
Abs Immature Granulocytes: 0 10*3/uL (ref 0.00–0.07)
Basophils Absolute: 0 10*3/uL (ref 0.0–0.1)
Basophils Relative: 0 %
Eosinophils Absolute: 0 10*3/uL (ref 0.0–0.5)
Eosinophils Relative: 0 %
HCT: 29.6 % — ABNORMAL LOW (ref 36.0–46.0)
Hemoglobin: 10.1 g/dL — ABNORMAL LOW (ref 12.0–15.0)
Immature Granulocytes: 0 %
Lymphocytes Relative: 23 %
Lymphs Abs: 0.6 10*3/uL — ABNORMAL LOW (ref 0.7–4.0)
MCH: 31.1 pg (ref 26.0–34.0)
MCHC: 34.1 g/dL (ref 30.0–36.0)
MCV: 91.1 fL (ref 80.0–100.0)
Monocytes Absolute: 0.3 10*3/uL (ref 0.1–1.0)
Monocytes Relative: 10 %
Neutro Abs: 1.7 10*3/uL (ref 1.7–7.7)
Neutrophils Relative %: 67 %
Platelets: 51 10*3/uL — ABNORMAL LOW (ref 150–400)
RBC: 3.25 MIL/uL — ABNORMAL LOW (ref 3.87–5.11)
RDW: 14.3 % (ref 11.5–15.5)
WBC: 2.5 10*3/uL — ABNORMAL LOW (ref 4.0–10.5)
nRBC: 0 % (ref 0.0–0.2)

## 2021-01-12 LAB — BODY FLUID CELL COUNT WITH DIFFERENTIAL
Eos, Fluid: 0 %
Lymphs, Fluid: 40 %
Monocyte-Macrophage-Serous Fluid: 49 % — ABNORMAL LOW (ref 50–90)
Neutrophil Count, Fluid: 11 % (ref 0–25)
Total Nucleated Cell Count, Fluid: 68 cu mm (ref 0–1000)

## 2021-01-12 LAB — LIPASE, BLOOD: Lipase: 29 U/L (ref 11–51)

## 2021-01-12 LAB — RESP PANEL BY RT-PCR (FLU A&B, COVID) ARPGX2
Influenza A by PCR: NEGATIVE
Influenza B by PCR: NEGATIVE
SARS Coronavirus 2 by RT PCR: NEGATIVE

## 2021-01-12 LAB — ABO/RH: ABO/RH(D): A POS

## 2021-01-12 LAB — PROTIME-INR
INR: 2.4 — ABNORMAL HIGH (ref 0.8–1.2)
Prothrombin Time: 25.7 seconds — ABNORMAL HIGH (ref 11.4–15.2)

## 2021-01-12 LAB — AMMONIA: Ammonia: 38 umol/L — ABNORMAL HIGH (ref 9–35)

## 2021-01-12 LAB — APTT: aPTT: 48 seconds — ABNORMAL HIGH (ref 24–36)

## 2021-01-12 IMAGING — US US PARACENTESIS
1 series · 4 of 4 positions shown · non-contrast
Comparison: none

INDICATION: Autoimmune hepatitis.  Ascites.

[Series 1: us paracentesis mc & wl · 4 of 4 slices shown]
[im 1/4]
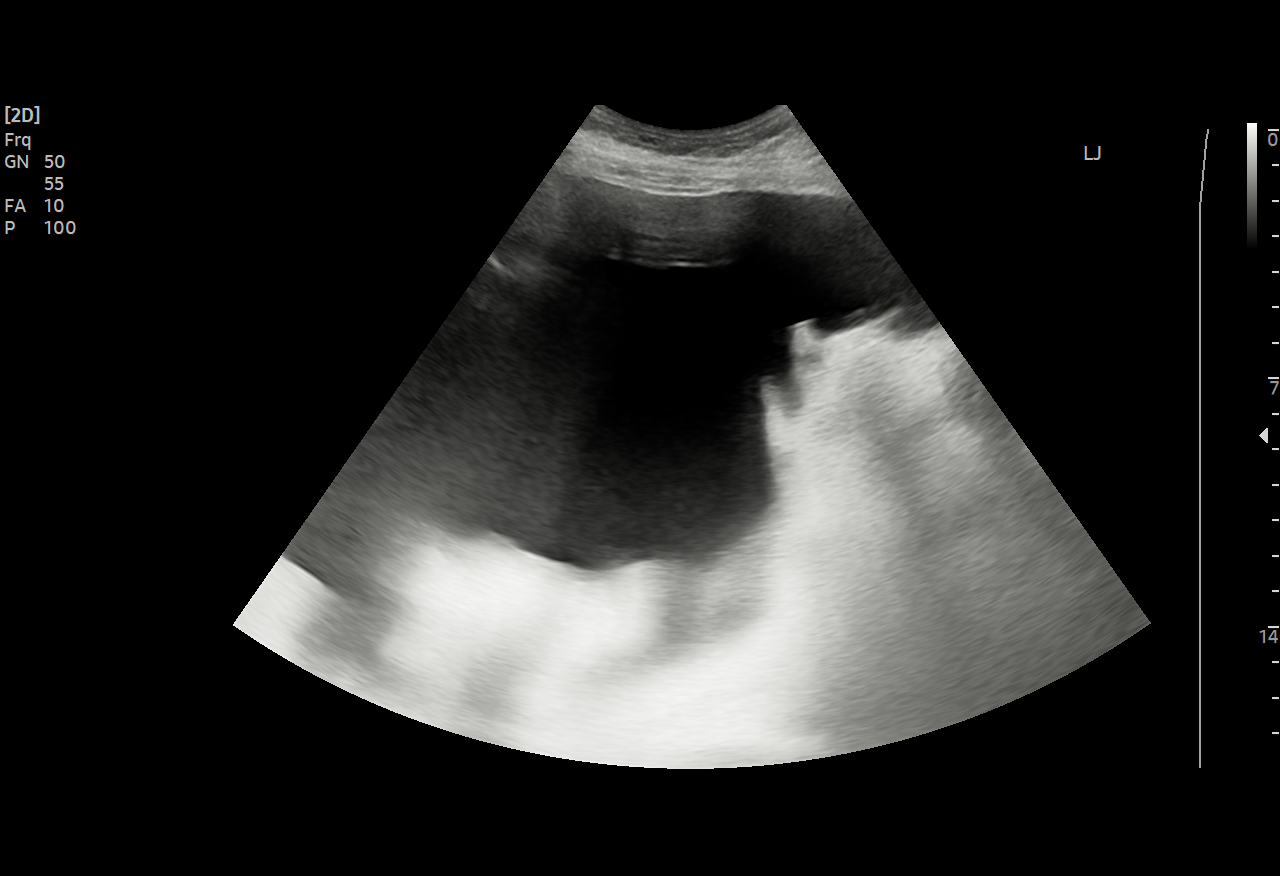
[im 2/4]
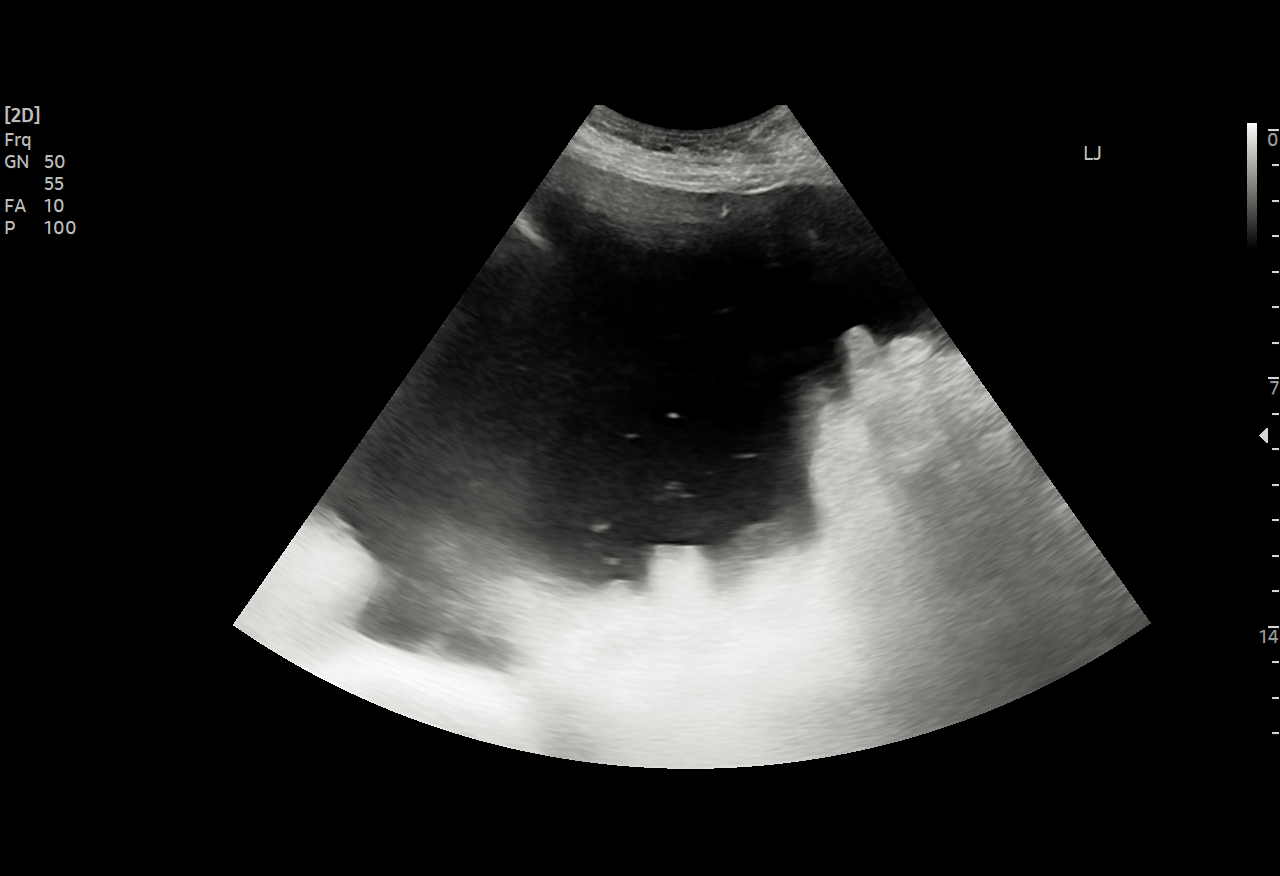
[im 3/4]
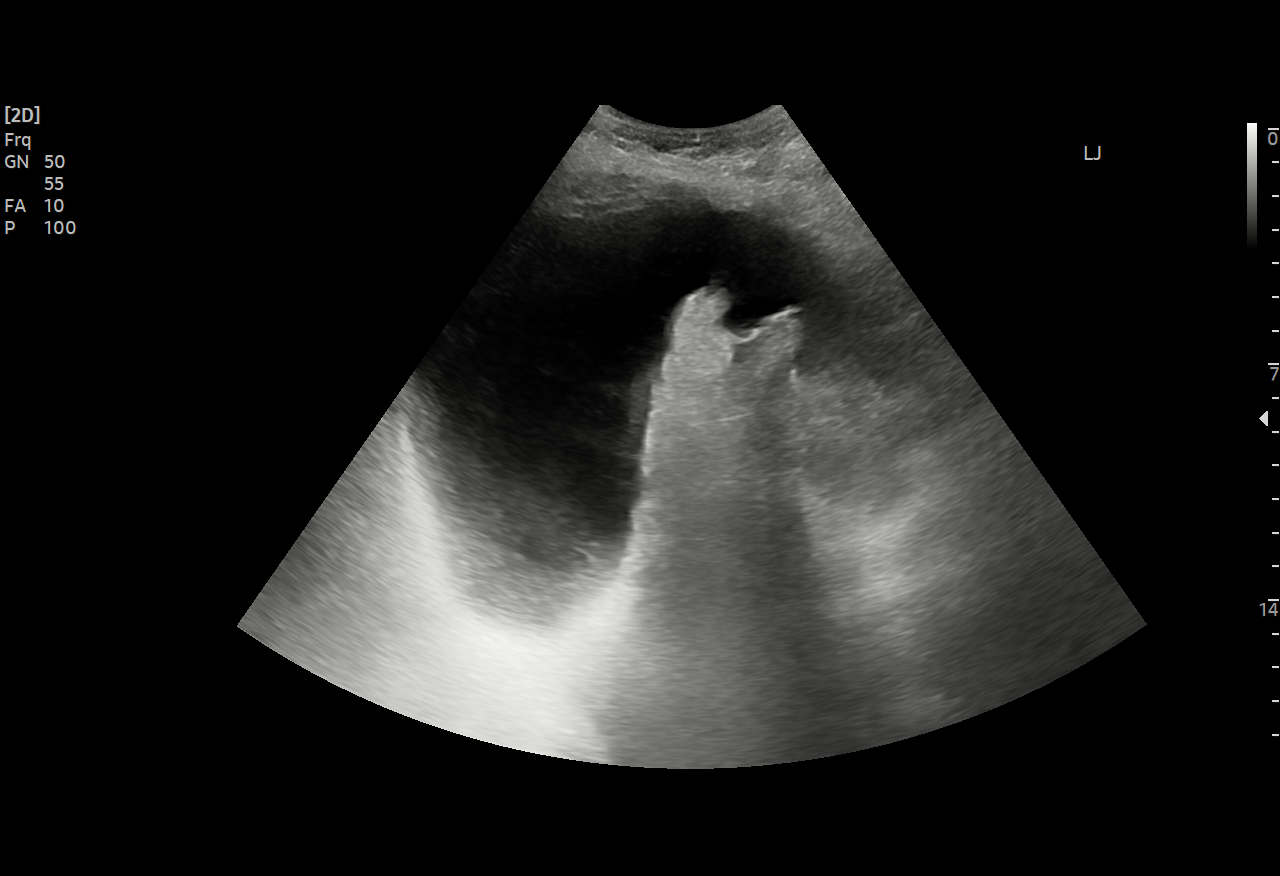
[im 4/4]
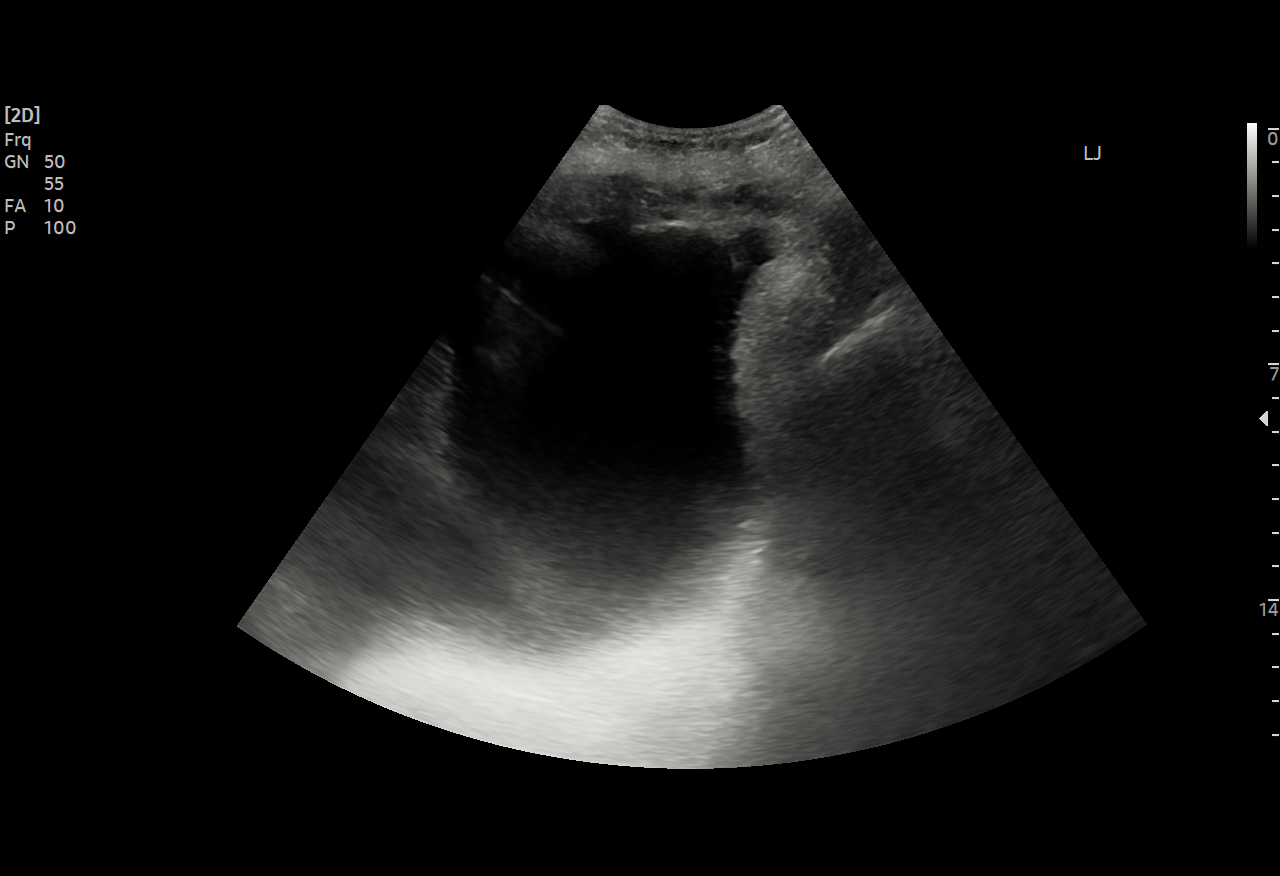

[4 of 4 positions shown; findings below may reference images not displayed]

EXAM:
ULTRASOUND GUIDED  PARACENTESIS

MEDICATIONS:
None.

COMPLICATIONS:
None immediate.

PROCEDURE:
Informed written consent was obtained from the patient after a
discussion of the risks, benefits and alternatives to treatment. A
timeout was performed prior to the initiation of the procedure.

Initial ultrasound scanning demonstrates a large amount of ascites
within the right lower abdominal quadrant. The right lower abdomen
was prepped and draped in the usual sterile fashion. 1% tetracaine
was used for local anesthesia.

Following this, a Yueh catheter was introduced. An ultrasound image
was saved for documentation purposes. The paracentesis was
performed. The catheter was removed and a dressing was applied. The
patient tolerated the procedure well without immediate post
procedural complication.
Patient received post-procedure intravenous albumin; see nursing
notes for details.

Procedure was performed by SARAMATI.
FINDINGS: A total of approximately 3.5 L of yellow fluid was removed. Samples
were sent to the laboratory as requested by the clinical team.
IMPRESSION: Successful ultrasound-guided paracentesis yielding 3.5 liters of
peritoneal fluid.

## 2021-01-12 IMAGING — DX DG CHEST 1V PORT
1 series · 1 of 1 positions shown · non-contrast
Comparison: Chest radiograph dated [DATE]

CLINICAL DATA: Shortness of breath

EXAM:
PORTABLE CHEST 1 VIEW

[chest ap]
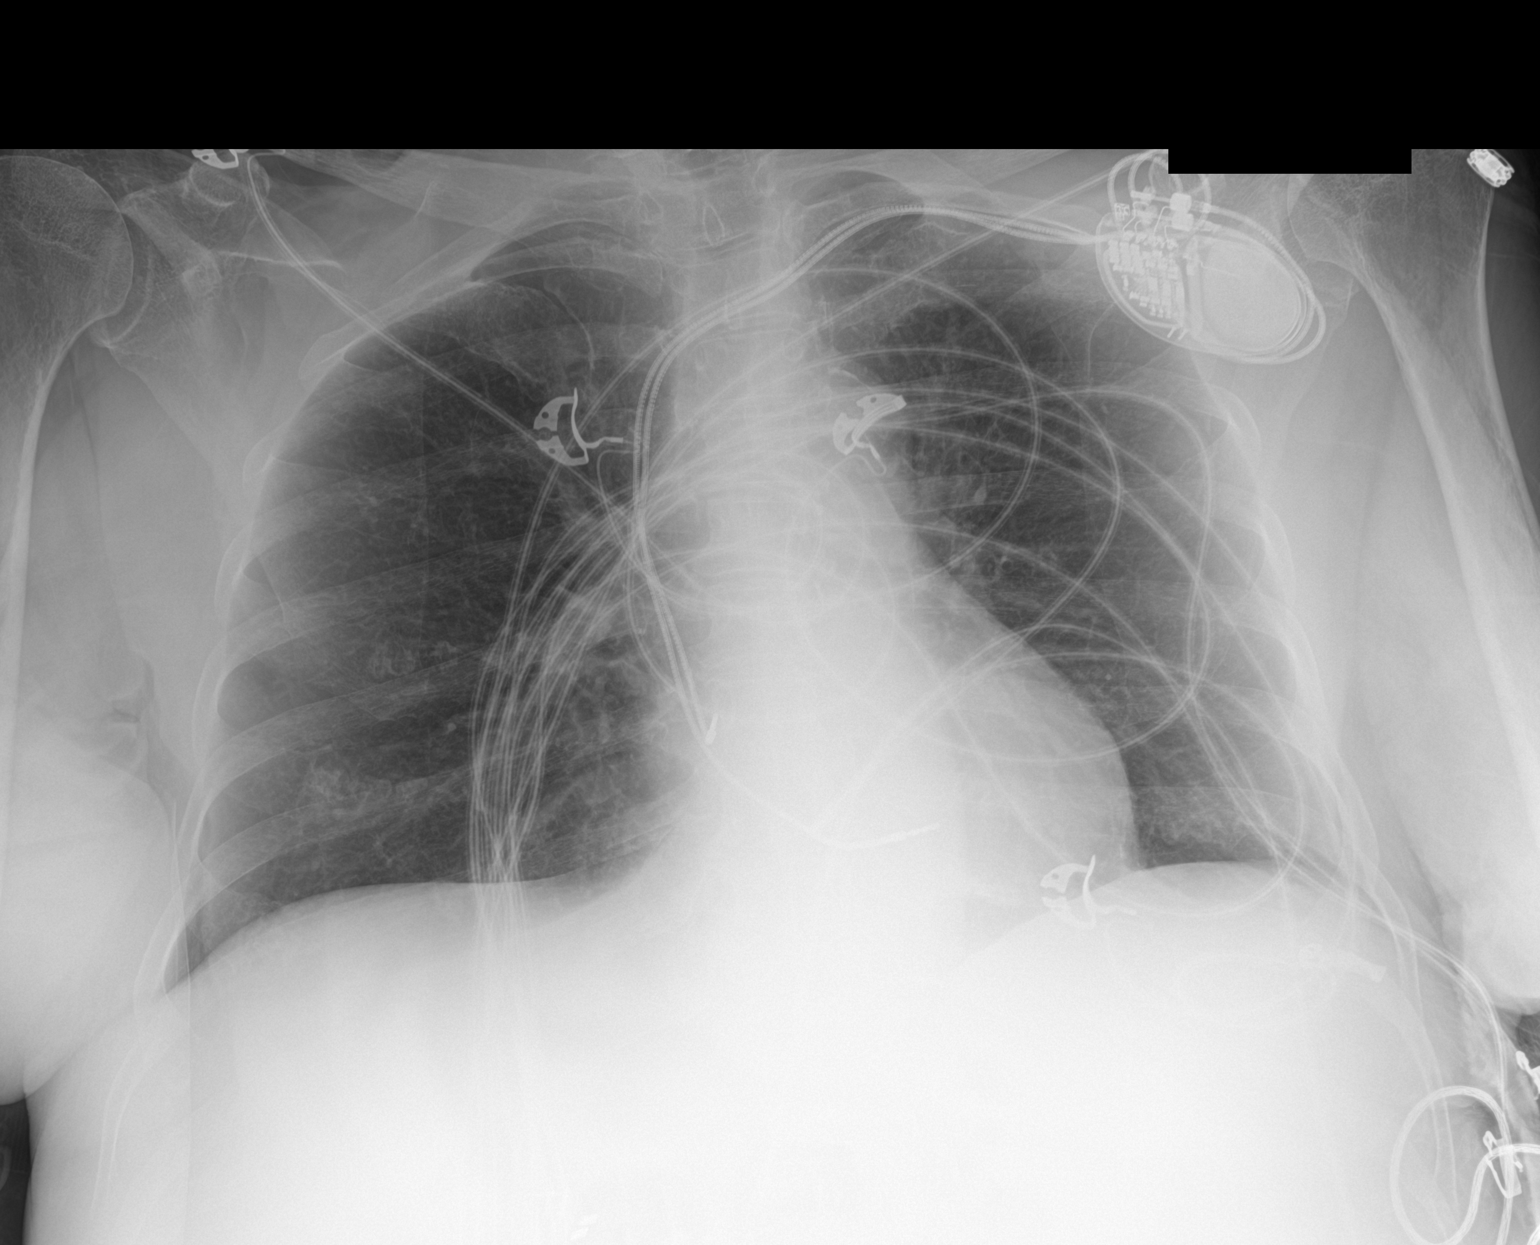

[1 of 1 positions shown; findings below may reference images not displayed]

FINDINGS: The heart size and mediastinal contours are within normal limits.
Pacemaker leads terminating in the right atrium and right ventricle.
Both lungs are clear. The visualized skeletal structures are
unremarkable.
IMPRESSION: No active disease.

## 2021-01-12 IMAGING — CT CT ABD-PELV W/O CM
2 of 4 series · 16 of 46 positions shown, 18 images · non-contrast
Comparison: [DATE]

CLINICAL DATA: Abdominal pain and dyspnea since last night, dark
stools, blood in stool, abdominal distension for 1 month; history
cirrhosis, drug-induced parkinsonism, GERD, stage IV chronic kidney
disease

EXAM:
CT ABDOMEN AND PELVIS WITHOUT CONTRAST
TECHNIQUE: Multidetector CT imaging of the abdomen and pelvis was performed
following the standard protocol without IV contrast.

[Series 2: axial st · axial · 0.87mm/px · z∈[+1040,+1500]mm · 13 of 102 slices shown, 15 images]
[im 5/102  soft-tissue]
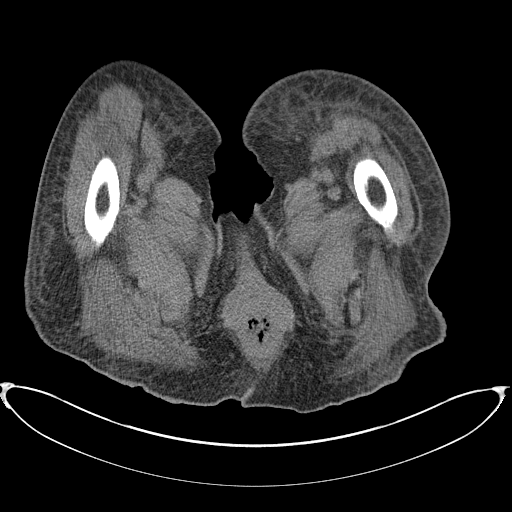
[im 5/102  bone]
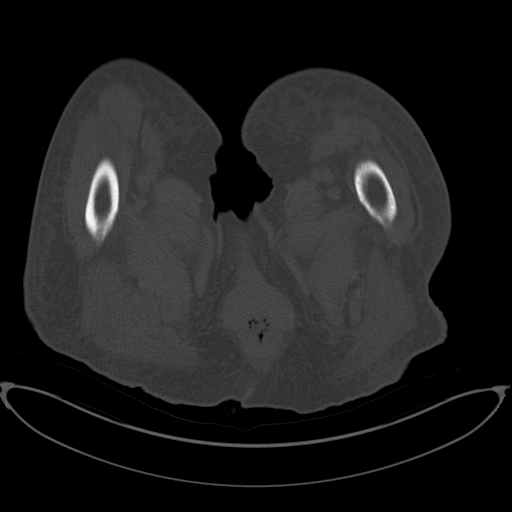
[im 14/102  soft-tissue]
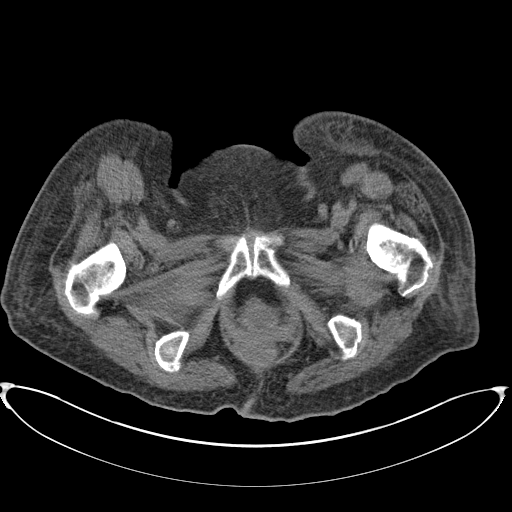
[im 23/102  soft-tissue]
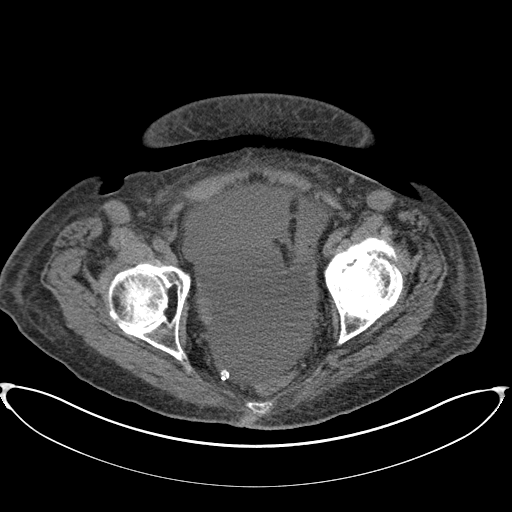
[im 28/102  soft-tissue]
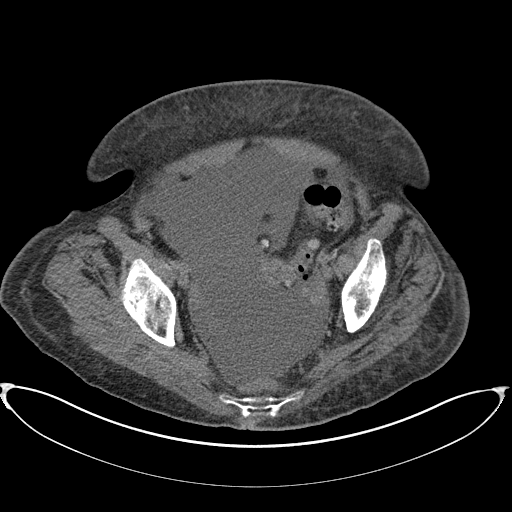
[im 37/102  soft-tissue]
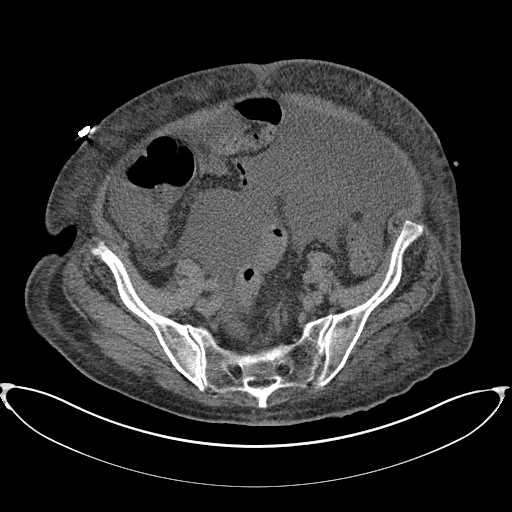
[im 42/102  soft-tissue]
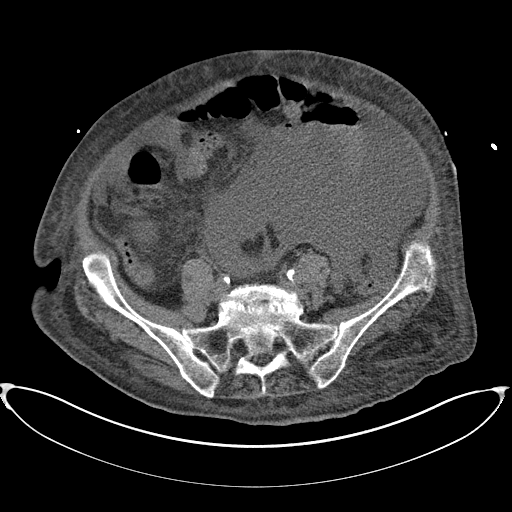
[im 51/102  soft-tissue]
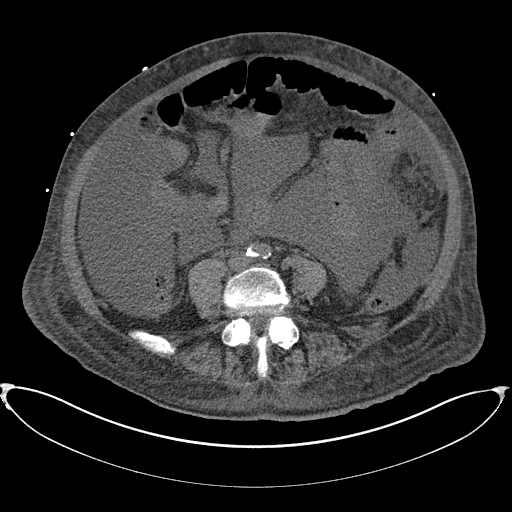
[im 60/102  soft-tissue]
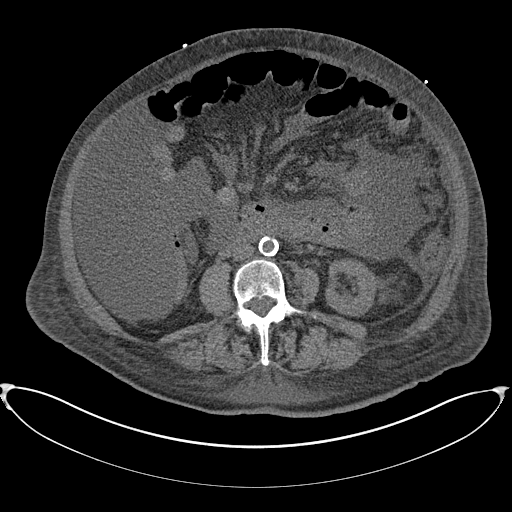
[im 65/102  soft-tissue]
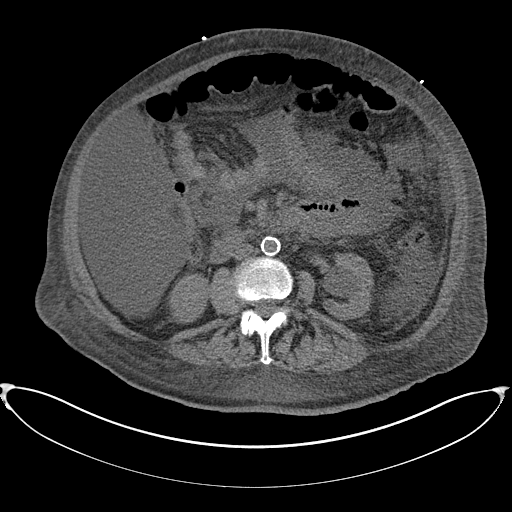
[im 65/102  bone]
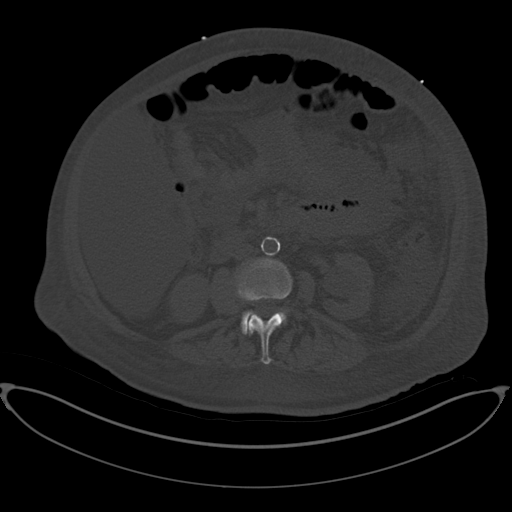
[im 74/102  soft-tissue]
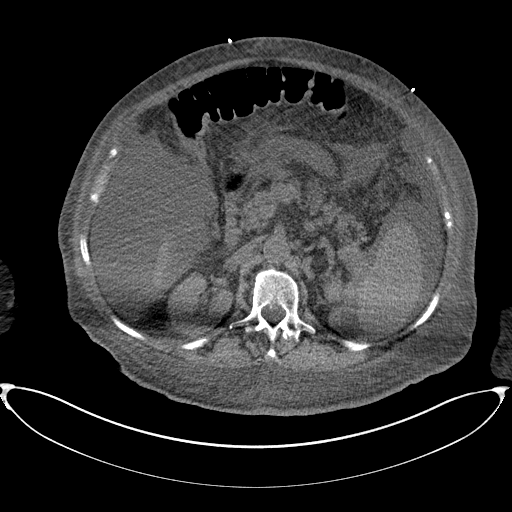
[im 79/102  soft-tissue]
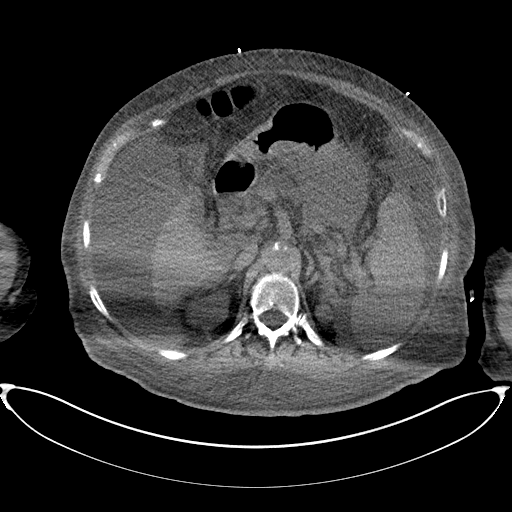
[im 88/102  soft-tissue]
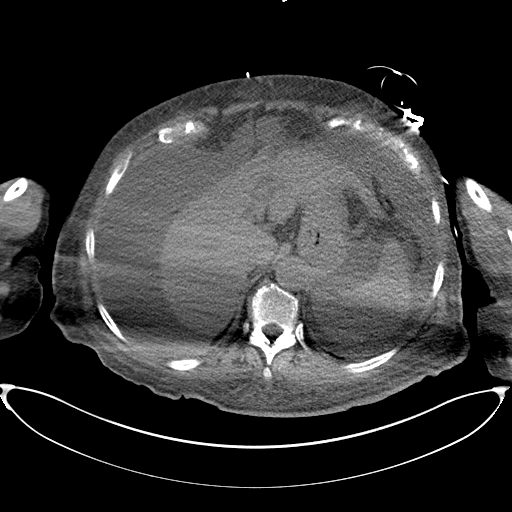
[im 97/102  soft-tissue]
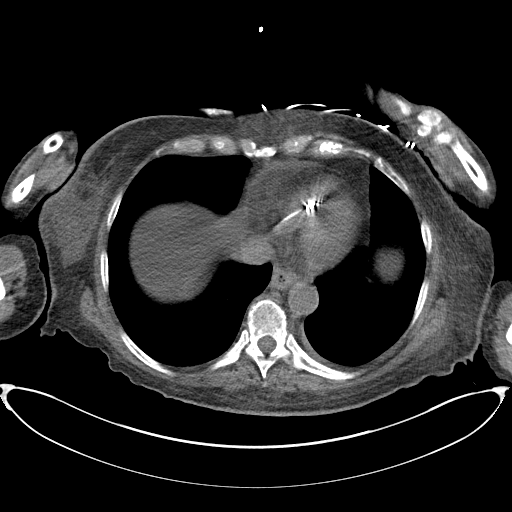

[Series 5: coronal st · coronal · 0.80mm/px · 3 of 175 slices shown]
[im 59/175  soft-tissue]
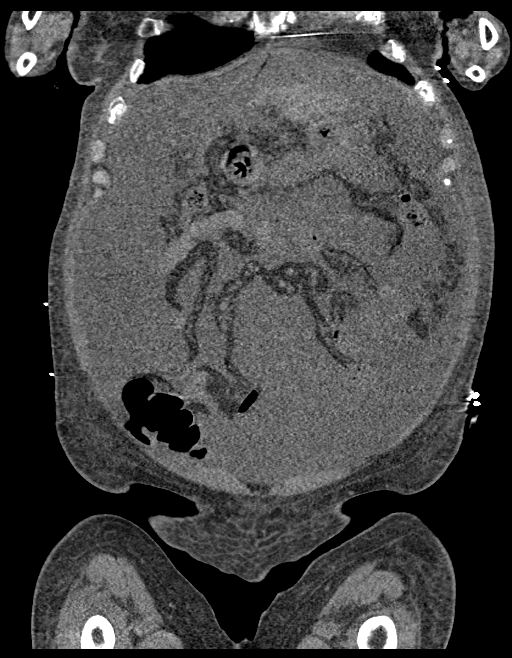
[im 78/175  soft-tissue]
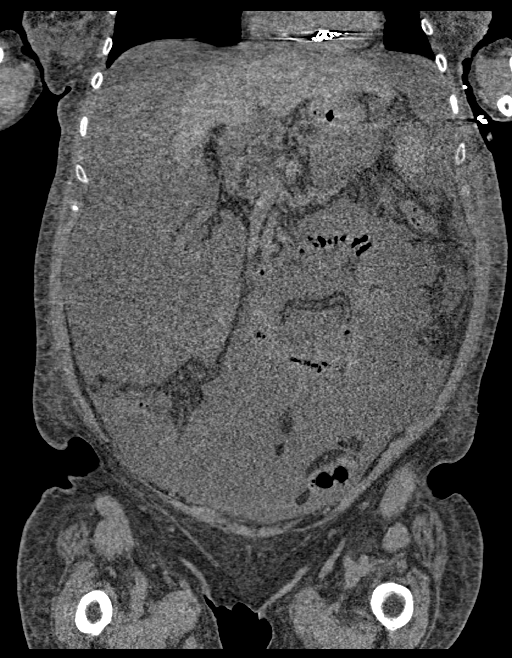
[im 97/175  soft-tissue]
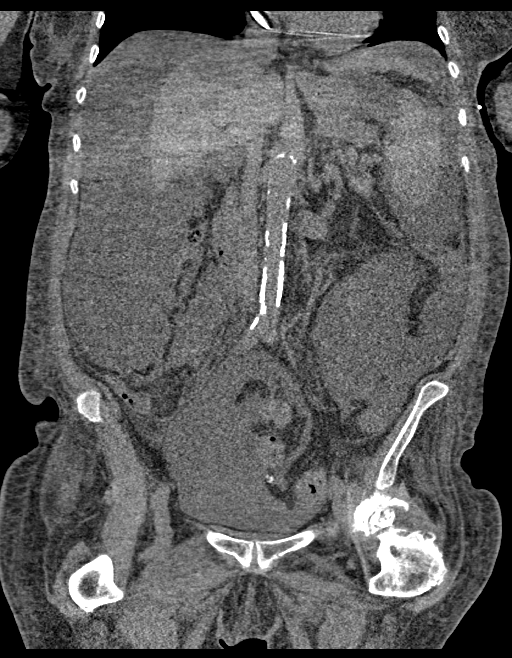

[16 of 46 positions shown; findings below may reference images not displayed]

FINDINGS: Lower chest: Lung bases clear.  RIGHT ventricular pacemaker lead.

Hepatobiliary: Liver small without focal mass. Post cholecystectomy.

Pancreas: Atrophic pancreas without mass

Spleen: Normal appearance

Adrenals/Urinary Tract: Adrenal glands normal appearance. Peripelvic
cyst central LEFT kidney again seen 2.6 x 2.8 cm. Tiny hyperdense
nodule lower LEFT kidney laterally, corresponding to a small cyst
seen on the previous exam, decreased in size but increased in
attenuation. Bladder decompressed.

Stomach/Bowel: Displacement of bowel loops by ascites. Sigmoid
diverticulosis without evidence of diverticulitis. Stomach and
remaining bowel loops unremarkable.

Vascular/Lymphatic: Atherosclerotic calcifications aorta and iliac
arteries without aneurysm. No adenopathy. Perisplenic collaterals.

Reproductive: Uterus surgically absent. Nodular soft tissue foci in
the pelvis bilaterally likely represent the ovaries as noted on
prior exam.

Other: Marked ascites. Diffuse soft tissue edema. No free air. No
hernia.

Musculoskeletal: Osseous demineralization. Degenerative changes LEFT
hip joint.
IMPRESSION: Marked ascites.

Small liver consistent with known cirrhosis with note of perisplenic
collaterals.

Sigmoid diverticulosis without evidence of diverticulitis.

Aortic Atherosclerosis ([9E]-[9E]).

## 2021-01-12 MED ORDER — LACTATED RINGERS IV SOLN: INTRAVENOUS | Status: DC

## 2021-01-12 MED ORDER — FENTANYL CITRATE PF 50 MCG/ML IJ SOSY
50.0000 ug | PREFILLED_SYRINGE | Freq: Once | INTRAMUSCULAR | Status: DC
  Filled 2021-01-12: qty 1

## 2021-01-12 MED ORDER — SODIUM CHLORIDE 0.9 % IV SOLN: INTRAVENOUS | Status: DC | PRN

## 2021-01-12 MED ORDER — ALBUMIN HUMAN 25 % IV SOLN: 50.0000 g | Freq: Once | INTRAVENOUS | Status: DC

## 2021-01-12 MED ORDER — PANTOPRAZOLE INFUSION (NEW) - SIMPLE MED
8.0000 mg/h | INTRAVENOUS | Status: DC
  Administered 2021-01-12 – 2021-01-13 (×3): 8 mg/h via INTRAVENOUS
  Filled 2021-01-12: qty 80
  Filled 2021-01-12: qty 100
  Filled 2021-01-12 (×2): qty 80

## 2021-01-12 MED ORDER — PANTOPRAZOLE 80MG IVPB - SIMPLE MED
80.0000 mg | Freq: Once | INTRAVENOUS | Status: AC
  Administered 2021-01-12: 80 mg via INTRAVENOUS
  Filled 2021-01-12: qty 80

## 2021-01-12 MED ORDER — PANTOPRAZOLE SODIUM 40 MG IV SOLR: 40.0000 mg | Freq: Two times a day (BID) | INTRAVENOUS | Status: DC

## 2021-01-12 MED ORDER — SODIUM CHLORIDE 0.9 % IV SOLN
50.0000 ug/h | INTRAVENOUS | Status: DC
  Administered 2021-01-12 – 2021-01-13 (×3): 50 ug/h via INTRAVENOUS
  Filled 2021-01-12 (×4): qty 1

## 2021-01-12 MED ORDER — ONDANSETRON HCL 4 MG/2ML IJ SOLN
4.0000 mg | Freq: Four times a day (QID) | INTRAMUSCULAR | Status: DC | PRN
  Administered 2021-01-16: 12:00:00 4 mg via INTRAVENOUS
  Filled 2021-01-12: qty 2

## 2021-01-12 MED ORDER — OCTREOTIDE LOAD VIA INFUSION
50.0000 ug | Freq: Once | INTRAVENOUS | Status: AC
  Administered 2021-01-12: 18:00:00 50 ug via INTRAVENOUS
  Filled 2021-01-12: qty 25

## 2021-01-12 MED ORDER — LIDOCAINE HCL 1 % IJ SOLN
INTRAMUSCULAR | Status: AC
  Filled 2021-01-12: qty 20

## 2021-01-12 MED ORDER — CIPROFLOXACIN IN D5W 400 MG/200ML IV SOLN
400.0000 mg | Freq: Two times a day (BID) | INTRAVENOUS | Status: DC
  Administered 2021-01-12 – 2021-01-18 (×13): 400 mg via INTRAVENOUS
  Filled 2021-01-12 (×14): qty 200

## 2021-01-12 MED ORDER — ALBUMIN HUMAN 25 % IV SOLN
50.0000 g | Freq: Once | INTRAVENOUS | Status: AC
  Administered 2021-01-12: 16:00:00 50 g via INTRAVENOUS
  Filled 2021-01-12: qty 200

## 2021-01-12 MED ORDER — ONDANSETRON HCL 4 MG PO TABS: 4.0000 mg | ORAL_TABLET | Freq: Four times a day (QID) | ORAL | Status: DC | PRN

## 2021-01-12 MED ORDER — TETRACAINE HCL 1 % IJ SOLN
5.0000 mg | INTRAMUSCULAR | Status: AC
  Administered 2021-01-12: 17:00:00 0.5 mL via INTRADERMAL
  Filled 2021-01-12: qty 2

## 2021-01-12 MED ORDER — TETRACAINE HCL 1 % IJ SOLN: 5.0000 mg | Freq: Once | INTRAMUSCULAR | Status: DC

## 2021-01-12 NOTE — Telephone Encounter (Signed)
Inbound call from patient states she is bleeding from rectum and stools are black. She is in a lot of pain from her stomach to her chest. She says it is getting hard to breathe.

## 2021-01-12 NOTE — H&P (Signed)
History and Physical    Stacey Velazquez QIH:[id number] DOB: 04-Jan-1946 DOA: 01/12/2021  PCP: Libby Maw, MD   Patient coming from: Home.  I have personally briefly reviewed patient's old medical records in [place]  Chief Complaint: Abdominal pain and shortness of breath.  HPI: Stacey Velazquez is a 75 y.o. female with medical history significant of colon polyps, anxiety, bilateral lower extremity edema, bipolar disorder With insomnia, cutaneous horn, drug-induced parkinsonism, esophageal varices, fibromyalgia, gastric ulcer, GERD, gingival abscess, glaucoma, CAD, history of MI, hypothyroidism, migraine headaches, neutropenia, osteoporosis, paranoid schizophrenia, polyarthritis, rectal mass, seizure disorder, sinus arrest, sick sinus syndrome, pacemaker placement, history of other nonhemorrhagic stroke, thrombocytopenia who is coming to the emergency department with progressively worse abdominal distention for the past month associated with worsening dyspnea, lower extremity edema and abdominal pain that began last night.  The patient also is stated that for the past few days she has had melena and hematochezia.  No nausea or vomiting.  She denied fever, but complains of chills, fatigue and malaise.  Her appetite is decreased.  She said that she has not been urinating as much as usual despite increasing water intake.  No flank pain, dysuria, frequency or hematuria.  No chest pain, palpitations, diaphoresis, PND, positive orthopnea.  ED Course: Initial vital signs were temperature 98.1 F, pulse 95, respiration 18, BP 118/86 mmHg and O2 sat 90% on room air.  While in the ER, I ordered albumin 50 g IVPB and consulted IR for paracentesis.  I started ciprofloxacin, octreotide and pantoprazole infusions after consulting Dr. Rush Landmark from the lower GI who was covering for the patient's primary gastroenterologist Dr. Thornton Park.  Lab work: CBC is her white count of 2.5, hemoglobin  10.1 g/dL and platelets 51.  PTT was 48, PT 25.7 and INR 2.4 lipase was normal.  Ammonia 38 mol/L.  CMP showed a sodium of 131 mmol/L.  The rest of the electrolytes were normal when calcium was corrected to albumin level.  Glucose was 99, BUN 18, creatinine 1.22 and total bilirubin 1.6 mg/dL.  GFR was 46 mL/min.  Total protein 6.4 and albumin 2.1 g/dL.  AST 6 was 56 units/L.  Normal ALT and alkaline phosphatase.  Review of Systems: As per HPI otherwise all other systems reviewed and are negative.  Past Medical History:  Diagnosis Date   Adenomatous polyp of ascending colon    Anxiety    Bilateral lower extremity edema    burning sensations   Bipolar 1 disorder (HCC)    Carpal tunnel syndrome    Cirrhosis of liver (HCC)    CKD (chronic kidney disease) stage 4, GFR 15-29 ml/min ([place]) 11/04/2020   Cutaneous horn    Drug-induced parkinsonism ([place])    patient unaware   Esophageal varices ([place])    Fibromyalgia    patient denies   Gastric ulcer    GERD (gastroesophageal reflux disease)    Gingival abscess 08/26/2016   Glaucoma 2003   pt unaware   Heart attack ([place])    Heart disease    Hepatitis, autoimmune ([place]) 08/25/2014   History of iron deficiency anemia    Hypotension    Hypothyroidism    Migraines    Neutropenia (HCC)    Osteoporosis    Pacemaker Medtronic    MRI compatible   Paranoid schizophrenia ([place])    Polyarthritis    Polyosteoarthritis    Rectal mass    Seizures ([place])    last sz 08/21/14   Sinus  arrest    Skin cancer    forehead   SSS (sick sinus syndrome) ([place])    Stroke ([place])    Thrombocytopenia (HCC)    Vertigo    bvvp   Past Surgical History:  Procedure Laterality Date   BIOPSY  03/06/2019   Procedure: BIOPSY;  Surgeon: Thornton Park, MD;  Location: [facility];  Service: Gastroenterology;;  EGD and COLON   BIOPSY  04/15/2020   Procedure: BIOPSY;  Surgeon: Thornton Park, MD;  Location: [facility];  Service: Gastroenterology;;   CATARACT  EXTRACTION Left 2008   pt unaware   CHOLECYSTECTOMY     COLONOSCOPY WITH PROPOFOL N/A 03/06/2019   Procedure: COLONOSCOPY WITH PROPOFOL;  Surgeon: Thornton Park, MD;  Location: [facility];  Service: Gastroenterology;  Laterality: N/A;   ESOPHAGOGASTRODUODENOSCOPY (EGD) WITH PROPOFOL N/A 03/06/2019   Procedure: ESOPHAGOGASTRODUODENOSCOPY (EGD) WITH PROPOFOL ;  Surgeon: Thornton Park, MD;  Location: [facility];  Service: Gastroenterology;  Laterality: N/A;   ESOPHAGOGASTRODUODENOSCOPY (EGD) WITH PROPOFOL N/A 04/15/2020   Procedure: ESOPHAGOGASTRODUODENOSCOPY (EGD) WITH PROPOFOL;  Surgeon: Thornton Park, MD;  Location: [facility];  Service: Gastroenterology;  Laterality: N/A;   KNEE SURGERY Left 1985   PACEMAKER INSERTION  2014   PARTIAL HYSTERECTOMY  1979   PARTIAL PROCTECTOMY BY TEM N/A 05/10/2019   Procedure: TEM PARTIAL PROCTECTOMY OF RECTAL MASS WITH EXCISION RIGHT PERINEAL SKIN MASS;  Surgeon: Michael Boston, MD;  Location: [facility];  Service: General;  Laterality: N/A;   POLYPECTOMY  03/06/2019   Procedure: POLYPECTOMY;  Surgeon: Thornton Park, MD;  Location: [facility];  Service: Gastroenterology;;   [place] History  reports that she quit smoking about 17 years ago. Her smoking use included cigarettes. She has never used smokeless tobacco. She reports that she does not drink alcohol and does not use drugs.  Allergies  Allergen Reactions   Misc. Throat Products Other (See Comments)    PT states that she is allergic to OTC  Flu, cold, sinus, allergy, gas, and GERD meds   Neomycin Nausea And Vomiting   Oxycodone-Acetaminophen Rash   Ambien [Zolpidem Tartrate] Other (See Comments)    Hallucinations    Amitriptyline Other (See Comments)    Stops heart per pt   Ampicillin Other (See Comments)    Stops heart per pt    Anaprox [Naproxen Sodium] Other (See Comments)    Doesn't work per pt   Benadryl [Diphenhydramine Hcl (Sleep)]  Other (See Comments)    Rash   Cetirizine & Related Other (See Comments)    Swelling    Cortizone-10 [Hydrocortisone] Other (See Comments)    seizures    Darvon [Propoxyphene] Other (See Comments)    Increased heart rate per pt    Diazepam Other (See Comments)    Stops heart per pt   Diflunisal Swelling and Other (See Comments)    Stops heart per pt (Dolobid)   Duloxetine Other (See Comments)    Reaction to Cymbalta - pt doesn't remember what the reaction was   Flexeril [Cyclobenzaprine] Other (See Comments)    Seizures    Lactose Intolerance (Gi) Swelling and Other (See Comments)    cramping   Lidocaine Other (See Comments)    Seizures   Meperidine And Related Other (See Comments)    Stops heart rate (reaction to Demerol)   Metanx [L-Methylfolate-Algae-B12-B6] Other (See Comments)    Pt does not remember reaction   Metoclopramide Other (See Comments)  Pt does not remember reaction   Morphine And Related Other (See Comments)    Stops heart per pt    Nuprin [Ibuprofen] Other (See Comments)    Caused headache   Oxycodone Other (See Comments)    Hallucinations    Penicillins Other (See Comments)    Stops heart per pt    Propoxyphene Other (See Comments)    Slowed heart rate per pt (reaction to Darvocet)   Ranitidine Other (See Comments)    Pt does not remember reaction   Vicodin [Hydrocodone-Acetaminophen] Other (See Comments)    Seizures    Butenafine Hcl Rash   Tramadol Other (See Comments)   Tylenol [Acetaminophen] Rash   Zolpidem Other (See Comments) and Rash    Hallucinations    Family History  Problem Relation Age of Onset   Heart attack Father    Heart disease Paternal Uncle    Heart disease Paternal Grandmother    Diabetes Paternal Grandmother    Heart disease Paternal Grandfather    Breast cancer Mother    Cancer Maternal Grandmother    CVA Maternal Grandmother    Cancer Maternal Grandfather    CVA Maternal Grandfather    Diabetes Sister     Diabetes Brother    Diabetes Brother    Stroke Daughter    Prior to Admission medications   Medication Sig Start Date End Date Taking? Authorizing Provider  dexlansoprazole (DEXILANT) 60 MG capsule Take 1 capsule (60 mg total) by mouth daily. 05/08/20  Yes Gatha Mayer, MD  famotidine (PEPCID) 20 MG tablet Take 1 tablet (20 mg total) by mouth 2 (two) times daily. 12/12/20  Yes Thornton Park, MD  levothyroxine (SYNTHROID) 50 MCG tablet Take 1 tablet (50 mcg total) by mouth daily. 12/17/20  Yes Libby Maw, MD  polyethylene glycol (MIRALAX / GLYCOLAX) 17 g packet Take 8.5 g by mouth daily as needed for severe constipation.   Yes [provider]  Probiotic Product (DIGESTIVE ADV DIGESTIVE/IMMUNE) CHEW Chew 2 capsules by mouth at bedtime.   Yes [provider]  Skin Protectants, Misc. (DRY SKIN EX) Apply 1 application topically daily. Apply to both elbows, knee caps, and feet   Yes [provider]  tamsulosin (FLOMAX) 0.4 MG CAPS capsule Take 0.4 mg by mouth at bedtime. 07/16/19  Yes [provider]  furosemide (LASIX) 20 MG tablet Take 20 mg by mouth daily. Patient not taking: Reported on 01/12/2021    [provider]  linaclotide Rolan Lipa) 145 MCG CAPS capsule Take 1 capsule (145 mcg total) by mouth daily before breakfast. Patient not taking: Reported on 01/12/2021 09/25/20   Thornton Park, MD  spironolactone (ALDACTONE) 25 MG tablet TAKE 1/2 TABLET (12.5 MG TOTAL) BY MOUTH DAILY. Patient not taking: Reported on 01/12/2021 06/18/20   Haydee Salter, MD  tacrolimus (PROGRAF) 1 MG capsule Take 2 capsules (2 mg total) by mouth daily. ON HOLD D/T RENAL FUNCTION Patient not taking: Reported on 01/12/2021 11/06/20   Thornton Park, MD   Physical Exam: Vitals:   01/12/21 1215 01/12/21 1315 01/12/21 1345 01/12/21 1445  BP: 125/84 107/80 111/81 124/82  Pulse: 98 90 80 88  Resp: (!) 25 12 (!) 24 (!) 23  Temp:      TempSrc:      SpO2:  97% 100% 100% 100%  Weight:      Height:       Constitutional: Chronically ill-appearing.  NAD, calm, comfortable Eyes: PERRL, lids and conjunctivae normal ENMT: Mucous membranes  are moist. Posterior pharynx clear of any exudate or lesions. Neck: normal, supple, no masses, no thyromegaly Respiratory: Decreased breath sounds in bases, but otherwise clear to auscultation bilaterally, no wheezing, no crackles. Normal respiratory effort. No accessory muscle use.  Cardiovascular: Regular rate and rhythm, no murmurs / rubs / gallops.  4+ bilateral lower extremity pitting edema. 2+ pedal pulses. No carotid bruits.  Abdomen: Distended, positive ascites.  No tenderness, no masses palpated. No hepatosplenomegaly. Bowel sounds positive.  Musculoskeletal: Mild generalized weakness.  No clubbing / cyanosis, good ROM, no contractures. Normal muscle tone.  Skin: no acute rashes, lesions, ulcers on very limited dermatological examination. Neurologic: CN 2-12 grossly intact. Sensation intact, DTR normal. Strength 5/5 in all 4.  Psychiatric: Normal judgment and insight. Alert and oriented x 3. Normal.  Labs on Admission: I have personally reviewed following labs and imaging studies  CBC: Recent Labs  Lab 01/08/21 1012 01/12/21 1153  WBC 2.3* 2.5*  NEUTROABS  --  1.7  HGB 10.7* 10.1*  HCT 31.4* 29.6*  MCV 90.8 91.1  PLT 26.0 Repeated and verified X2.* 51*   Basic Metabolic Panel: Recent Labs  Lab 01/08/21 1012 01/12/21 1153  NA 133* 131*  K 4.0 4.1  CL 104 106  CO2 22 22  GLUCOSE 82 99  BUN 15 18  CREATININE 1.18 1.22*  CALCIUM 8.0* 7.8*   GFR: Estimated Creatinine Clearance: 44.5 mL/min (A) (by C-G formula based on SCr of 1.22 mg/dL (H)).  Liver Function Tests: Recent Labs  Lab 01/08/21 1012 01/12/21 1153  AST 49*   49* 56*  ALT 29   29 37  ALKPHOS 110   110 105  BILITOT 1.2   1.2 1.6*  PROT 6.3   6.3 6.4*  ALBUMIN 2.2*   2.2* 2.1*   Urine analysis:    Component Value  Date/Time   COLORURINE AMBER (A) 01/12/2021 1509   APPEARANCEUR HAZY (A) 01/12/2021 1509   LABSPEC 1.019 01/12/2021 1509   PHURINE 5.0 01/12/2021 [place] 01/12/2021 1509   GLUCOSEU NEGATIVE 11/14/2020 1041   HGBUR SMALL (A) 01/12/2021 1509   BILIRUBINUR NEGATIVE 01/12/2021 1509   KETONESUR NEGATIVE 01/12/2021 1509   PROTEINUR NEGATIVE 01/12/2021 1509   UROBILINOGEN 0.2 11/14/2020 1041   NITRITE NEGATIVE 01/12/2021 1509   LEUKOCYTESUR NEGATIVE 01/12/2021 1509   Radiological Exams on Admission: CT Abdomen Pelvis Wo Contrast  Result Date: 01/12/2021 CLINICAL DATA:  Abdominal pain and dyspnea since last night, dark stools, blood in stool, abdominal distension for 1 month; history cirrhosis, drug-induced parkinsonism, GERD, stage IV chronic kidney disease EXAM: CT ABDOMEN AND PELVIS WITHOUT CONTRAST TECHNIQUE: Multidetector CT imaging of the abdomen and pelvis was performed following the standard protocol without IV contrast. COMPARISON:  11/14/2019 FINDINGS: Lower chest: Lung bases clear.  RIGHT ventricular pacemaker lead. Hepatobiliary: Liver small without focal mass. Post cholecystectomy. Pancreas: Atrophic pancreas without mass Spleen: Normal appearance Adrenals/Urinary Tract: Adrenal glands normal appearance. Peripelvic cyst central LEFT kidney again seen 2.6 x 2.8 cm. Tiny hyperdense nodule lower LEFT kidney laterally, corresponding to a small cyst seen on the previous exam, decreased in size but increased in attenuation. Bladder decompressed. Stomach/Bowel: Displacement of bowel loops by ascites. Sigmoid diverticulosis without evidence of diverticulitis. Stomach and remaining bowel loops unremarkable. Vascular/Lymphatic: Atherosclerotic calcifications aorta and iliac arteries without aneurysm. No adenopathy. Perisplenic collaterals. Reproductive: Uterus surgically absent. Nodular soft tissue foci in the pelvis bilaterally likely represent the ovaries as noted on prior exam.  Other: Marked ascites. Diffuse  soft tissue edema. No free air. No hernia. Musculoskeletal: Osseous demineralization. Degenerative changes LEFT hip joint. IMPRESSION: Marked ascites. Small liver consistent with known cirrhosis with note of perisplenic collaterals. Sigmoid diverticulosis without evidence of diverticulitis. Aortic Atherosclerosis (ICD10-I70.0). Electronically Signed   By: Lavonia Dana M.D.   On: 01/12/2021 13:23   DG Chest Port 1 View  Result Date: 01/12/2021 CLINICAL DATA:  Shortness of breath EXAM: PORTABLE CHEST 1 VIEW COMPARISON:  Chest radiograph dated August 31, 2019 FINDINGS: The heart size and mediastinal contours are within normal limits. Pacemaker leads terminating in the right atrium and right ventricle. Both lungs are clear. The visualized skeletal structures are unremarkable. IMPRESSION: No active disease. Electronically Signed   By: Keane Police D.O.   On: 01/12/2021 12:57    EKG: Independently reviewed.   Assessment/Plan Principal Problem:   Abdominal ascites Secondary to   Autoimmune hepatitis ([place])    Liver cirrhosis Observation/PCU. Clear liquid diet.Marland Kitchen Keep NPO. IR will perform paracentesis. IR will collect samples for fluid analysis. Ciprofloxacin IVPB started preventively. Pantoprazole 80 mg IVPB x1 Begin pantoprazole infusion. Octreotide 50 mcg IVP x1. Beginning octreotide inf' Monitor hematocrit and hemoglobin. Gastroenterology will follow.  Active Problems:   Esophageal varices determined by endoscopy [facility]) Gastroenterology has been consulted.    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    Hyponatremia In the setting of decompensated liver cirrhosis.    Chronic blood loss anemia Monitor hematocrit and hemoglobin.    Hypothyroidism Continue levothyroxine.     DVT prophylaxis: SCDs Code Status:   Full code. Family Communication:   Disposition Plan:   Patient is from:  Home.  Anticipated DC to:  Home.  Anticipated DC  date:  01/14/2021.  Anticipated DC barriers: Clinical status.  Consults called:   Admission status:  Observation/PCU.    Severity of Illness:High severity in the setting of decompensated liver cirrhosis with dyspnea, abdominal pain, decreased urinary output, melena and hematochezia.  Reubin Milan MD [facility]  How to contact the [facility] Attending or Consulting provider [place] or covering provider during after hours [place], for this patient?   Check the care team in [facility] and look for a) attending/consulting [facility] provider listed and b) the [facility] team listed Log into [email or web address] and use [place]'s universal password to access. If you do not have the password, please contact the hospital operator. Locate the [facility] provider you are looking for under [facility] and page to a number that you can be directly reached. If you still have difficulty reaching the provider, please page the [facility] (Director on Call) for the Hospitalists listed on amion for assistance.  01/12/2021, 4:41 PM   This document was created using Dragon voice recognition software and may contain some unintended transcription errors.

## 2021-01-12 NOTE — ED Notes (Signed)
Pt transported to CT ?

## 2021-01-12 NOTE — ED Provider Notes (Signed)
[facility] DEPT Provider Note   CSN: [id number] Arrival date & time: 01/12/21  1133     History Chief Complaint  Patient presents with   Abdominal Pain    Stacey Velazquez is a 75 y.o. female.  75 year old female with history autoimmune hepatitis presents with abdominal distention and shortness of breath.  Also she has been having bloody stools for the past several days.  Notes increased dyspnea exertion.  No cough or congestion.  Has never had a paracentesis.  Has noted increased abdominal distention of the last several weeks.  Has also noted increased lower extremity edema.  No treatment use for this prior to arrival      Past Medical History:  Diagnosis Date   Adenomatous polyp of ascending colon    Anxiety    Bilateral lower extremity edema    burning sensations   Bipolar 1 disorder ([place])    Carpal tunnel syndrome    Cirrhosis of liver ([place])    CKD (chronic kidney disease) stage 4, GFR 15-29 ml/min ([place]) 11/04/2020   Cutaneous horn    Drug-induced parkinsonism ([place])    patient unaware   Esophageal varices ([place])    Fibromyalgia    patient denies   Gastric ulcer    GERD (gastroesophageal reflux disease)    Gingival abscess 08/26/2016   Glaucoma 2003   pt unaware   Heart attack ([place])    Heart disease    Hepatitis, autoimmune ([place]) 08/25/2014   History of iron deficiency anemia    Hypotension    Hypothyroidism    Migraines    Neutropenia (HCC)    Osteoporosis    Pacemaker Medtronic    MRI compatible   Paranoid schizophrenia ([place])    Polyarthritis    Polyosteoarthritis    Rectal mass    Seizures ([place])    last sz 08/21/14   Sinus arrest    Skin cancer    forehead   SSS (sick sinus syndrome) (HCC)    Stroke (HCC)    Thrombocytopenia ([place])    Vertigo    bvvp    Patient Active Problem List   Diagnosis Date Noted   Ceruminosis, left 12/16/2020   Hyponatremia 11/04/2020   CKD (chronic kidney disease) stage 4, GFR 15-29 ml/min  ([place]) 11/04/2020   Need for influenza vaccination 11/04/2020   Otitis externa 09/11/2020   Bilateral hearing loss 09/11/2020   Portal hypertensive gastropathy ([place]) 04/21/2020   Acute esophagitis    Basal cell carcinoma of scalp 03/24/2020   Onychomycosis 03/24/2020   Pedal edema 03/24/2020   Esophageal varices determined by endoscopy ([place]) 07/03/2019   Limited mobility 05/14/2019   Adenomatous polyp of rectum s/p TEM partial proctectomy 05/09/2019 05/10/2019   Hepatic cirrhosis ([place]) 04/09/2019   Obesity, Class II, BMI 35-39.9 04/09/2019   Hallucinations 03/13/2019   Rectal bleeding 03/05/2019   Vertigo    Heart disease    GERD (gastroesophageal reflux disease)    Autoimmune hepatitis ([place])    Anxiety    Low back pain 10/18/2016   Acute right hip pain 08/26/2016   Seizure disorder ([place]) 10/14/2014   Drug-induced Parkinson's disease ([place]) 10/14/2014   Hypothyroidism 09/09/2014   Leukopenia 08/25/2014   Thrombocytopenia due to drugs 08/25/2014   Syncope    Sinus arrest    Pacemaker Medtronic    Bipolar 1 disorder ([place])    Paranoid schizophrenia ([place]) 03/25/2012   Glaucoma 01/25/2001    Past Surgical History:  Procedure Laterality Date  BIOPSY  03/06/2019   Procedure: BIOPSY;  Surgeon: Thornton Park, MD;  Location: [facility];  Service: Gastroenterology;;  EGD and COLON   BIOPSY  04/15/2020   Procedure: BIOPSY;  Surgeon: Thornton Park, MD;  Location: [facility];  Service: Gastroenterology;;   CATARACT EXTRACTION Left 2008   pt unaware   CHOLECYSTECTOMY     COLONOSCOPY WITH PROPOFOL N/A 03/06/2019   Procedure: COLONOSCOPY WITH PROPOFOL;  Surgeon: Thornton Park, MD;  Location: [facility];  Service: Gastroenterology;  Laterality: N/A;   ESOPHAGOGASTRODUODENOSCOPY (EGD) WITH PROPOFOL N/A 03/06/2019   Procedure: ESOPHAGOGASTRODUODENOSCOPY (EGD) WITH PROPOFOL ;  Surgeon: Thornton Park, MD;  Location: [facility];  Service: Gastroenterology;  Laterality: N/A;    ESOPHAGOGASTRODUODENOSCOPY (EGD) WITH PROPOFOL N/A 04/15/2020   Procedure: ESOPHAGOGASTRODUODENOSCOPY (EGD) WITH PROPOFOL;  Surgeon: Thornton Park, MD;  Location: [facility];  Service: Gastroenterology;  Laterality: N/A;   KNEE SURGERY Left 1985   PACEMAKER INSERTION  2014   PARTIAL HYSTERECTOMY  1979   PARTIAL PROCTECTOMY BY TEM N/A 05/10/2019   Procedure: TEM PARTIAL PROCTECTOMY OF RECTAL MASS WITH EXCISION RIGHT PERINEAL SKIN MASS;  Surgeon: Michael Boston, MD;  Location: [facility];  Service: General;  Laterality: N/A;   POLYPECTOMY  03/06/2019   Procedure: POLYPECTOMY;  Surgeon: Thornton Park, MD;  Location: [facility];  Service: Gastroenterology;;   REFRACTIVE SURGERY     TUBAL LIGATION       OB History   No obstetric history on file.     Family History  Problem Relation Age of Onset   Heart attack Father    Heart disease Paternal Uncle    Heart disease Paternal Grandmother    Diabetes Paternal Grandmother    Heart disease Paternal Grandfather    Breast cancer Mother    Cancer Maternal Grandmother    CVA Maternal Grandmother    Cancer Maternal Grandfather    CVA Maternal Grandfather    Diabetes Sister    Diabetes Brother    Diabetes Brother    Stroke Daughter     Social History   Tobacco Use   Smoking status: Former    Types: Cigarettes    Quit date: 03/02/2003    Years since quitting: 17.8   Smokeless tobacco: Never  Vaping Use   Vaping Use: Never used  Substance Use Topics   Alcohol use: No   Drug use: No    Home Medications Prior to Admission medications   Medication Sig Start Date End Date Taking? Authorizing Provider  dexlansoprazole (DEXILANT) 60 MG capsule Take 1 capsule (60 mg total) by mouth daily. 05/08/20   Gatha Mayer, MD  famotidine (PEPCID) 20 MG tablet Take 1 tablet (20 mg total) by mouth 2 (two) times daily. 12/12/20   Thornton Park, MD  furosemide (LASIX) 20 MG tablet Take 20 mg by mouth daily.    [provider]   levothyroxine (SYNTHROID) 50 MCG tablet Take 1 tablet (50 mcg total) by mouth daily. 12/17/20   Libby Maw, MD  linaclotide [facility]) 145 MCG CAPS capsule Take 1 capsule (145 mcg total) by mouth daily before breakfast. 09/25/20   Thornton Park, MD  Probiotic Product (DIGESTIVE ADV DIGESTIVE/IMMUNE) CHEW Chew 2 capsules by mouth at bedtime.    [provider]  spironolactone (ALDACTONE) 25 MG tablet TAKE 1/2 TABLET (12.5 MG TOTAL) BY MOUTH DAILY. 06/18/20   Haydee Salter, MD  tacrolimus (PROGRAF) 1 MG capsule Take 2 capsules (2 mg total) by mouth daily. ON HOLD D/T RENAL FUNCTION 11/06/20  Thornton Park, MD  tamsulosin (FLOMAX) 0.4 MG CAPS capsule Take 0.4 mg by mouth at bedtime. 07/16/19   [provider]    Allergies    Neomycin, Ambien [zolpidem tartrate], Amitriptyline, Ampicillin, Anaprox [naproxen sodium], Benadryl [diphenhydramine hcl (sleep)], Cetirizine & related, Cortizone-10 [hydrocortisone], Darvon [propoxyphene], Diazepam, Diflunisal, Duloxetine, Flexeril [cyclobenzaprine], Lactose intolerance (gi), Lidocaine, Meperidine and related, Metanx [l-methylfolate-algae-b12-b6], Metoclopramide, Morphine and related, Nuprin [ibuprofen], Oxycodone, Penicillins, Propoxyphene, Ranitidine, Vicodin [hydrocodone-acetaminophen], and Tylenol [acetaminophen]  Review of Systems   Review of Systems  All other systems reviewed and are negative.  Physical Exam Updated Vital Signs BP 98/78 (BP Location: Right Arm)    Pulse 95    Temp 98.1 F (36.7 C) (Oral)    Resp 18    Ht 1.753 m (5\' 9" )    Wt 80.3 kg    SpO2 90%    BMI 26.14 kg/m   Physical Exam Vitals and nursing note reviewed.  Constitutional:      General: She is not in acute distress.    Appearance: Normal appearance. She is well-developed. She is not toxic-appearing.  HENT:     Head: Normocephalic and atraumatic.  Eyes:     General: Lids are normal.     Conjunctiva/sclera: Conjunctivae normal.      Pupils: Pupils are equal, round, and reactive to light.  Neck:     Thyroid: No thyroid mass.     Trachea: No tracheal deviation.  Cardiovascular:     Rate and Rhythm: Normal rate and regular rhythm.     Heart sounds: Normal heart sounds. No murmur heard.   No gallop.  Pulmonary:     Effort: Pulmonary effort is normal. No respiratory distress.     Breath sounds: Normal breath sounds. No stridor. No decreased breath sounds, wheezing, rhonchi or rales.  Abdominal:     General: There is distension.     Palpations: Abdomen is soft. There is shifting dullness and fluid wave.     Tenderness: There is generalized abdominal tenderness. There is no guarding or rebound.     Comments: No bloody stool noted on digital rectal exam  Musculoskeletal:        General: No tenderness. Normal range of motion.     Cervical back: Normal range of motion and neck supple.  Lymphadenopathy:     Comments: 3+ bilateral lower extremity pitting edema  Skin:    General: Skin is warm and dry.     Findings: No abrasion or rash.  Neurological:     Mental Status: She is alert and oriented to person, place, and time. Mental status is at baseline.     GCS: GCS eye subscore is 4. GCS verbal subscore is 5. GCS motor subscore is 6.     Cranial Nerves: No cranial nerve deficit.     Sensory: No sensory deficit.     Motor: Motor function is intact.  Psychiatric:        Attention and Perception: Attention normal.        Speech: Speech normal.        Behavior: Behavior normal.    ED Results / Procedures / Treatments   Labs (all labs ordered are listed, but only abnormal results are displayed) Labs Reviewed  CBC WITH DIFFERENTIAL/PLATELET    EKG None  Radiology No results found.  Procedures Procedures   Medications Ordered in ED Medications  lactated ringers infusion (has no administration in time range)    ED Course  I have reviewed the triage  vital signs and the nursing notes.  Pertinent labs &  imaging results that were available during my care of the patient were reviewed by me and considered in my medical decision making (see chart for details).    MDM Rules/Calculators/A&P                         Abdominal CT without acute findings here.  She has known ascites and a repeat abdominal exam shows no evidence of peritonitis at this time.  She does have a significant amount of ascites still.  She does note whole body weakness.  Will consult hospitalist for admission    Final Clinical Impression(s) / ED Diagnoses Final diagnoses:  None    Rx / DC Orders ED Discharge Orders     None        Lacretia Leigh, MD 01/12/21 1343

## 2021-01-12 NOTE — ED Triage Notes (Signed)
Pt BIBA from home.  Per EMS pt c/o dyspnea and abdominal pain that began lastnight. Pt reports dark stools and blood in her stool. Pt also reports abdomen distention x 1 month.   BP 100/50 Sp02 -97% room air

## 2021-01-12 NOTE — Procedures (Signed)
Interventional Radiology Procedure:   Indications: Autoimmune hepatitis.  Ascites  Procedure: US guided paracentesis  Findings: Removed 3500 ml from right lower quadrant.   Procedure was performed by Rowe Robert, PA-C  Complications: None     EBL: Less than 10 ml   Harding Thomure R. Anselm Pancoast, MD  Pager: [phone]

## 2021-01-12 NOTE — Telephone Encounter (Signed)
Called pt and advised she call 911 to proceed to ED for further eval. Verbalized acceptance and understanding. States she is calling now.

## 2021-01-13 ENCOUNTER — Observation Stay (HOSPITAL_COMMUNITY): Payer: Medicare Other | Admitting: Certified Registered Nurse Anesthetist

## 2021-01-13 ENCOUNTER — Encounter (HOSPITAL_COMMUNITY): Payer: Self-pay | Admitting: Internal Medicine

## 2021-01-13 ENCOUNTER — Encounter (HOSPITAL_COMMUNITY)
Admission: EM | Disposition: A | Discharge status: Skilled Nursing Facility | Payer: Self-pay | Source: Home / Self Care | Attending: Family Medicine

## 2021-01-13 DIAGNOSIS — K754 Autoimmune hepatitis: Principal | ICD-10-CM

## 2021-01-13 DIAGNOSIS — T451X5A Adverse effect of antineoplastic and immunosuppressive drugs, initial encounter: Secondary | ICD-10-CM | POA: Diagnosis not present

## 2021-01-13 DIAGNOSIS — D500 Iron deficiency anemia secondary to blood loss (chronic): Secondary | ICD-10-CM | POA: Diagnosis not present

## 2021-01-13 DIAGNOSIS — R188 Other ascites: Secondary | ICD-10-CM | POA: Diagnosis present

## 2021-01-13 DIAGNOSIS — Z20822 Contact with and (suspected) exposure to covid-19: Secondary | ICD-10-CM | POA: Diagnosis present

## 2021-01-13 DIAGNOSIS — D125 Benign neoplasm of sigmoid colon: Secondary | ICD-10-CM

## 2021-01-13 DIAGNOSIS — D689 Coagulation defect, unspecified: Secondary | ICD-10-CM | POA: Diagnosis not present

## 2021-01-13 DIAGNOSIS — F319 Bipolar disorder, unspecified: Secondary | ICD-10-CM | POA: Diagnosis not present

## 2021-01-13 DIAGNOSIS — E871 Hypo-osmolality and hyponatremia: Secondary | ICD-10-CM | POA: Diagnosis present

## 2021-01-13 DIAGNOSIS — I129 Hypertensive chronic kidney disease with stage 1 through stage 4 chronic kidney disease, or unspecified chronic kidney disease: Secondary | ICD-10-CM | POA: Diagnosis present

## 2021-01-13 DIAGNOSIS — K2970 Gastritis, unspecified, without bleeding: Secondary | ICD-10-CM

## 2021-01-13 DIAGNOSIS — I8510 Secondary esophageal varices without bleeding: Secondary | ICD-10-CM | POA: Diagnosis present

## 2021-01-13 DIAGNOSIS — I8500 Esophageal varices without bleeding: Secondary | ICD-10-CM | POA: Diagnosis not present

## 2021-01-13 DIAGNOSIS — K766 Portal hypertension: Secondary | ICD-10-CM

## 2021-01-13 DIAGNOSIS — F200 Paranoid schizophrenia: Secondary | ICD-10-CM | POA: Diagnosis not present

## 2021-01-13 DIAGNOSIS — K5900 Constipation, unspecified: Secondary | ICD-10-CM | POA: Diagnosis present

## 2021-01-13 DIAGNOSIS — I2510 Atherosclerotic heart disease of native coronary artery without angina pectoris: Secondary | ICD-10-CM | POA: Diagnosis present

## 2021-01-13 DIAGNOSIS — I519 Heart disease, unspecified: Secondary | ICD-10-CM | POA: Diagnosis not present

## 2021-01-13 DIAGNOSIS — K7469 Other cirrhosis of liver: Secondary | ICD-10-CM | POA: Diagnosis present

## 2021-01-13 DIAGNOSIS — K7460 Unspecified cirrhosis of liver: Secondary | ICD-10-CM | POA: Diagnosis not present

## 2021-01-13 DIAGNOSIS — R531 Weakness: Secondary | ICD-10-CM | POA: Diagnosis not present

## 2021-01-13 DIAGNOSIS — Z7189 Other specified counseling: Secondary | ICD-10-CM | POA: Diagnosis not present

## 2021-01-13 DIAGNOSIS — I252 Old myocardial infarction: Secondary | ICD-10-CM | POA: Diagnosis not present

## 2021-01-13 DIAGNOSIS — K5730 Diverticulosis of large intestine without perforation or abscess without bleeding: Secondary | ICD-10-CM | POA: Diagnosis present

## 2021-01-13 DIAGNOSIS — N1831 Chronic kidney disease, stage 3a: Secondary | ICD-10-CM | POA: Diagnosis present

## 2021-01-13 DIAGNOSIS — I495 Sick sinus syndrome: Secondary | ICD-10-CM | POA: Diagnosis present

## 2021-01-13 DIAGNOSIS — R140 Abdominal distension (gaseous): Secondary | ICD-10-CM | POA: Diagnosis not present

## 2021-01-13 DIAGNOSIS — G9341 Metabolic encephalopathy: Secondary | ICD-10-CM | POA: Diagnosis present

## 2021-01-13 DIAGNOSIS — K2971 Gastritis, unspecified, with bleeding: Secondary | ICD-10-CM | POA: Diagnosis present

## 2021-01-13 DIAGNOSIS — M6281 Muscle weakness (generalized): Secondary | ICD-10-CM | POA: Diagnosis not present

## 2021-01-13 DIAGNOSIS — R262 Difficulty in walking, not elsewhere classified: Secondary | ICD-10-CM | POA: Diagnosis not present

## 2021-01-13 DIAGNOSIS — D62 Acute posthemorrhagic anemia: Secondary | ICD-10-CM | POA: Diagnosis present

## 2021-01-13 DIAGNOSIS — E039 Hypothyroidism, unspecified: Secondary | ICD-10-CM | POA: Diagnosis present

## 2021-01-13 DIAGNOSIS — R2681 Unsteadiness on feet: Secondary | ICD-10-CM | POA: Diagnosis not present

## 2021-01-13 DIAGNOSIS — D702 Other drug-induced agranulocytosis: Secondary | ICD-10-CM | POA: Diagnosis present

## 2021-01-13 DIAGNOSIS — Z515 Encounter for palliative care: Secondary | ICD-10-CM | POA: Diagnosis not present

## 2021-01-13 DIAGNOSIS — Z743 Need for continuous supervision: Secondary | ICD-10-CM | POA: Diagnosis not present

## 2021-01-13 DIAGNOSIS — D61818 Other pancytopenia: Secondary | ICD-10-CM | POA: Diagnosis present

## 2021-01-13 DIAGNOSIS — R7989 Other specified abnormal findings of blood chemistry: Secondary | ICD-10-CM | POA: Diagnosis not present

## 2021-01-13 DIAGNOSIS — H532 Diplopia: Secondary | ICD-10-CM | POA: Diagnosis present

## 2021-01-13 DIAGNOSIS — D684 Acquired coagulation factor deficiency: Secondary | ICD-10-CM | POA: Diagnosis present

## 2021-01-13 DIAGNOSIS — R5381 Other malaise: Secondary | ICD-10-CM | POA: Diagnosis not present

## 2021-01-13 DIAGNOSIS — N179 Acute kidney failure, unspecified: Secondary | ICD-10-CM | POA: Diagnosis not present

## 2021-01-13 DIAGNOSIS — D6481 Anemia due to antineoplastic chemotherapy: Secondary | ICD-10-CM | POA: Diagnosis not present

## 2021-01-13 DIAGNOSIS — K7290 Hepatic failure, unspecified without coma: Secondary | ICD-10-CM | POA: Diagnosis not present

## 2021-01-13 DIAGNOSIS — R41841 Cognitive communication deficit: Secondary | ICD-10-CM | POA: Diagnosis not present

## 2021-01-13 DIAGNOSIS — Z66 Do not resuscitate: Secondary | ICD-10-CM | POA: Diagnosis not present

## 2021-01-13 HISTORY — PX: FLEXIBLE SIGMOIDOSCOPY: SHX5431

## 2021-01-13 HISTORY — PX: ESOPHAGOGASTRODUODENOSCOPY: SHX5428

## 2021-01-13 HISTORY — PX: BIOPSY: SHX5522

## 2021-01-13 LAB — CBC
HCT: 23.9 % — ABNORMAL LOW (ref 36.0–46.0)
HCT: 24 % — ABNORMAL LOW (ref 36.0–46.0)
Hemoglobin: 7.8 g/dL — ABNORMAL LOW (ref 12.0–15.0)
Hemoglobin: 8.1 g/dL — ABNORMAL LOW (ref 12.0–15.0)
MCH: 30.7 pg (ref 26.0–34.0)
MCH: 30.7 pg (ref 26.0–34.0)
MCHC: 32.6 g/dL (ref 30.0–36.0)
MCHC: 33.8 g/dL (ref 30.0–36.0)
MCV: 94.1 fL (ref 80.0–100.0)
MCV: 90.9 fL (ref 80.0–100.0)
Platelets: 37 10*3/uL — ABNORMAL LOW (ref 150–400)
Platelets: 55 10*3/uL — ABNORMAL LOW (ref 150–400)
RBC: 2.54 MIL/uL — ABNORMAL LOW (ref 3.87–5.11)
RBC: 2.64 MIL/uL — ABNORMAL LOW (ref 3.87–5.11)
RDW: 14.4 % (ref 11.5–15.5)
RDW: 14.5 % (ref 11.5–15.5)
WBC: 1.5 10*3/uL — ABNORMAL LOW (ref 4.0–10.5)
WBC: 1.7 10*3/uL — ABNORMAL LOW (ref 4.0–10.5)
nRBC: 0 % (ref 0.0–0.2)
nRBC: 0 % (ref 0.0–0.2)

## 2021-01-13 LAB — PROTEIN, PLEURAL OR PERITONEAL FLUID: Total protein, fluid: <3 g/dL

## 2021-01-13 LAB — COMPREHENSIVE METABOLIC PANEL
ALT: 24 U/L (ref 0–44)
AST: 40 U/L (ref 15–41)
Albumin: 2.1 g/dL — ABNORMAL LOW (ref 3.5–5.0)
Alkaline Phosphatase: 72 U/L (ref 38–126)
Anion gap: 6 (ref 5–15)
BUN: 16 mg/dL (ref 8–23)
CO2: 20 mmol/L — ABNORMAL LOW (ref 22–32)
Calcium: 7.7 mg/dL — ABNORMAL LOW (ref 8.9–10.3)
Chloride: 106 mmol/L (ref 98–111)
Creatinine, Ser: 1.22 mg/dL — ABNORMAL HIGH (ref 0.44–1.00)
GFR, Estimated: 46 mL/min — ABNORMAL LOW (ref >60)
Glucose, Bld: 94 mg/dL (ref 70–99)
Potassium: 4.2 mmol/L (ref 3.5–5.1)
Sodium: 132 mmol/L — ABNORMAL LOW (ref 135–145)
Total Bilirubin: 1.6 mg/dL — ABNORMAL HIGH (ref 0.3–1.2)
Total Protein: 4.6 g/dL — ABNORMAL LOW (ref 6.5–8.1)

## 2021-01-13 LAB — PROTIME-INR
INR: 3.4 — ABNORMAL HIGH (ref 0.8–1.2)
Prothrombin Time: 34.3 seconds — ABNORMAL HIGH (ref 11.4–15.2)

## 2021-01-13 LAB — ALBUMIN, PLEURAL OR PERITONEAL FLUID: Albumin, Fluid: <1.5 g/dL

## 2021-01-13 LAB — MRSA NEXT GEN BY PCR, NASAL: MRSA by PCR Next Gen: NOT DETECTED

## 2021-01-13 SURGERY — EGD (ESOPHAGOGASTRODUODENOSCOPY)
Anesthesia: Monitor Anesthesia Care

## 2021-01-13 MED ORDER — CHLORHEXIDINE GLUCONATE CLOTH 2 % EX PADS
6.0000 | MEDICATED_PAD | Freq: Every day | CUTANEOUS | Status: DC
  Administered 2021-01-13 – 2021-01-15 (×3): 6 via TOPICAL

## 2021-01-13 MED ORDER — SODIUM CHLORIDE 0.9 % IV SOLN: INTRAVENOUS | Status: DC

## 2021-01-13 MED ORDER — PROPOFOL 10 MG/ML IV BOLUS
INTRAVENOUS | Status: AC
  Filled 2021-01-13: qty 20

## 2021-01-13 MED ORDER — SODIUM CHLORIDE 0.9 % IV BOLUS
250.0000 mL | Freq: Once | INTRAVENOUS | Status: AC
  Administered 2021-01-13: 07:00:00 250 mL via INTRAVENOUS

## 2021-01-13 MED ORDER — SODIUM CHLORIDE 0.9% IV SOLUTION: Freq: Once | INTRAVENOUS | Status: AC

## 2021-01-13 MED ORDER — LACTATED RINGERS IV SOLN: INTRAVENOUS | Status: DC

## 2021-01-13 MED ORDER — MIDODRINE HCL 5 MG PO TABS
5.0000 mg | ORAL_TABLET | Freq: Two times a day (BID) | ORAL | Status: DC
  Administered 2021-01-13 – 2021-01-23 (×20): 5 mg via ORAL
  Filled 2021-01-13 (×20): qty 1

## 2021-01-13 MED ORDER — TAMSULOSIN HCL 0.4 MG PO CAPS
0.4000 mg | ORAL_CAPSULE | Freq: Every day | ORAL | Status: DC
  Administered 2021-01-13 – 2021-01-22 (×10): 0.4 mg via ORAL
  Filled 2021-01-13 (×10): qty 1

## 2021-01-13 MED ORDER — LEVOTHYROXINE SODIUM 50 MCG PO TABS
50.0000 ug | ORAL_TABLET | Freq: Every day | ORAL | Status: DC
  Administered 2021-01-14 – 2021-01-23 (×10): 50 ug via ORAL
  Filled 2021-01-13 (×11): qty 1

## 2021-01-13 MED ORDER — PROPOFOL 500 MG/50ML IV EMUL
INTRAVENOUS | Status: DC | PRN
  Administered 2021-01-13: 100 ug/kg/min via INTRAVENOUS

## 2021-01-13 MED ORDER — PANTOPRAZOLE SODIUM 40 MG PO TBEC
40.0000 mg | DELAYED_RELEASE_TABLET | Freq: Two times a day (BID) | ORAL | Status: DC
  Administered 2021-01-13 – 2021-01-23 (×20): 40 mg via ORAL
  Filled 2021-01-13 (×20): qty 1

## 2021-01-13 MED ORDER — ORAL CARE MOUTH RINSE
15.0000 mL | Freq: Two times a day (BID) | OROMUCOSAL | Status: DC
  Administered 2021-01-13 – 2021-01-23 (×15): 15 mL via OROMUCOSAL

## 2021-01-13 MED ORDER — ALBUMIN HUMAN 25 % IV SOLN
50.0000 g | Freq: Once | INTRAVENOUS | Status: AC
  Administered 2021-01-13: 22:00:00 50 g via INTRAVENOUS

## 2021-01-13 MED ORDER — PROPOFOL 10 MG/ML IV BOLUS
INTRAVENOUS | Status: DC | PRN
  Administered 2021-01-13: 20 mg via INTRAVENOUS

## 2021-01-13 MED ORDER — ALBUMIN HUMAN 25 % IV SOLN
50.0000 g | Freq: Four times a day (QID) | INTRAVENOUS | Status: AC
  Administered 2021-01-13 (×2): 50 g via INTRAVENOUS
  Filled 2021-01-13 (×4): qty 200

## 2021-01-13 NOTE — Addendum Note (Signed)
Addendum  created 01/13/21 1935 by West Pugh, CRNA   Intraprocedure Meds edited

## 2021-01-13 NOTE — Consult Note (Signed)
Referring Provider: Dr. Tennis Must Primary Care Physician:  Libby Maw, MD Primary Gastroenterologist:  Dr. Thornton Park  Reason for Consultation: Autoimmune hepatitis  HPI: Stacey Velazquez is a 75 y.o. female with a past medical history of anxiety, depression, fibromyalgia, hypertension, sick sinus syndrome s/p pacemaker, coronary artery disease s/p MI, CVA, CKD, GERD, gastric ulcer 02/2019, tubular adenomatous and tubulovillous colon polyps, constipation, pancreatic tail cystic lesion, autoimmune hepatitis with cirrhosis (MELD 11), esophageal varices, portal hypertensive and pancytopenia.    She was initially diagnosed with autoimmune hepatitis per liver biopsy in 2006 at [facility].  She was initially treated with Azathioprine which was discontinued 02/2019 secondary to leukopenia.  At that time, she was transitioned to Prograf. She was evaluated by Roosevelt Locks NP at [facility] 06/05/2020 with recommendations to continue program as her LFTs and IgG levels somewhat improved on this regimen.  Steroids were deferred secondary to her advanced age.  No plans for liver transplant.  She is followed by Dr. Tarri Glenn, last seen in office for routine follow up 10/28/2020.  At that time, she remained on Prograf 3 mg daily and she was advised to follow-up up in office in 3 to 4 months with plans for a surveillance abdominal MRI 02/2021.   She was instructed by Dr. Tarri Glenn to hold Prograf for 2 days due to a rising creatinine level on 11/06/2020 then restart the Prograf in 2 day which was not done. She was also suspected to have a  UTI which was recently treated with Levofloxacin 742m po QOD x 10 days. [facility] She was seen by her nephrologist Dr. SCandiss Norse1 week ago and she was prescribed Furosemide 284mQD which she did not yet pick up from her pharmacy.   She contacted Dr. BeTarri Glennn 01/12/2021 with complaints of rectal bleeding with melenic stools x 1 day with abdominal  distention with lower extremity edema for the past month.  She was advised to go to the ED for further evaluation.  Labs in the ED showed a sodium level 131.  Potassium 4.1.  BUN 18.  Creatinine 1.22 (Cr 1.18 on 01/08/2021).  Calcium 7.8.  Alk phos 105.  Total bili 1.6.  AST 56.  ALT 37.  WBC 2.5.  Hematocrit 29.6.  MCV 91.1.  Platelet 51.  INR 2.4. Today INR 3.4. S/P paracentesis 01/12/2021, 3.5 L peritoneal fluid removed. No SBP.  Gram stain without WBC or other organisms.  Cultures pending. Started on Albumin IV 25gm IV Q 6 hrs. CTAP wo contrast identified a moderate amount of ascites, cirrhosis with perisplenic collaterals and sigmoid diverticulosis without evidence of diverticulitis. She was started on Cipro 40058mV for prophylaxis in the setting of GI bleeding.   She reported having upper and lower abdominal pain which has aggressively worsened over the past 2 to 4 weeks with associated abdominal distention and swelling in her legs.  She was seen by her nephrologist Dr. SinCandiss Norseweek ago and she was prescribed Furosemide 40m55m which she did not yet pick up from her pharmacy.  Her abdominal cramping pain worsened on Monday, 01/12/2021 and she reported passing a small black-colored stool with a small amount of dark red blood on the stool and bright red blood on the toilet tissue.  No further rectal bleeding since admission.  No NSAID use.  She is not on any anticoagulation.  No alcohol use.  No nausea or vomiting.  She has frequent heartburn for which  she takes Famotidine daily. No longer taking a PPI.   Recent IMAGE STUDIES:  CTAP without contrast 01/12/2021: Marked ascites.  Small liver consistent with known cirrhosis with note of perisplenic collaterals.  Sigmoid diverticulosis without evidence of diverticulitis.  Aortic Atherosclerosis   Chest xray 01/12/2021: No active disease.  Abdominal MRI with and without contrast 09/08/2020: Cirrhosis.  No findings suspicious for HCC.  Portal vein is  patent. Recanalized periumbilical vein and splenorenal shunt. Splenomegaly. No abdominal ascites.  10 mm unilocular cyst along the pancreatic tail, likely reflecting a benign side branch IPMN or pseudocyst  PAST GI PROCEDURES:  EGD 03/06/19: Grade II varices were found in the middle third of the esophagus. Moderate portal hypertensive gastropathy. Gastritis. One non-bleeding cratered gastric ulcer with no stigmata of bleeding was found on the lesser curvature of the stomach. There was no H pylori.  Colonoscopy with Dr. Benson Norway 02/10/2016 showed 6 polyps.  4 polyps ranged from 3 to 6 mm were removed and the descending: And transverse colon.  There were 2 polyps ranging from 30 to 40 mm that were biopsied and tattooed in the ascending colon and transverse.  Pathology report showed tubulous villous adenoma of high-grade dysplasia.  The patient was referred to [facility].   Colonoscopy with Dr. Stephanie Acre at [facility] 05/26/16: "The perianal and digital rectal examinations were normal. A greater than 50 mm polyp was found in the proximal ascending colon. The polyp was sessile. Preparations were made for mucosal resection. Saline with methylene blue was injected to raise the lesion. Snare mucosal resection was performed. Resection and retrieval were complete. Coagulation for tissue destruction using snare was successful. A 50 mm polyp was found in the hepatic flexure. The polyp was semi-pedunculated. Preparations were made for mucosal resection. Saline with methylene blue was injected to raise the lesion. Snare mucosal resection was performed. Resection and retrieval were complete. Coagulation for tissue destruction using snare was successful. The exam was otherwise without abnormality on direct and retroflexion  views." Pathology revealed Tubulovillous adenoma (multiple fragments) with multiple foci of high-grade dysplasia.  Surveillance colonoscopy recommended in 1 year.  Colonoscopy 03/06/19 showed a 20 mm polyp in the distal rectum  adjacent to the dentate line. Biopsies were consistent with tubulovillous adenoma with high-grade grandular dysplasia. She also had three 1 to 4 mm tubular adenomas and two hyperplastic polyps removed at that time.  TEM partial proctectomy of bleeding rectal mass with Dr. Johney Maine.  Pathology revealed a 2.6 cm tubulovillous adenoma.  Margins were uninvolved by dysplasia. EGD 04/15/20 showed 4 columns of grade 2 varices, portal hypertensive gastropathy, gastritis.  Gastric erosion had resolved.  Past Medical History:  Diagnosis Date   Adenomatous polyp of ascending colon    Anxiety    Bilateral lower extremity edema    burning sensations   Bipolar 1 disorder (HCC)    Carpal tunnel syndrome    Cirrhosis of liver (HCC)    CKD (chronic kidney disease) stage 4, GFR 15-29 ml/min (HCC) 11/04/2020   Cutaneous horn    Drug-induced parkinsonism ([place])    patient unaware   Esophageal varices ([place])    Fibromyalgia    patient denies   Gastric ulcer    GERD (gastroesophageal reflux disease)    Gingival abscess 08/26/2016   Glaucoma 2003   pt unaware   Heart attack ([place])    Heart disease    Hepatitis, autoimmune ([place]) 08/25/2014   History of iron deficiency anemia    Hypotension    Hypothyroidism  Migraines    Neutropenia ([place])    Osteoporosis    Pacemaker Med[employer]onic    MRI compatible   Paranoid schizophrenia ([place])    Polyarthritis    Polyosteoarthritis    Rectal mass    Seizures ([place])    last sz 08/21/14   Sinus arrest    Skin cancer    forehead   SSS (sick sinus syndrome) ([place])    Stroke ([place])    Thrombocytopenia ([place])    Vertigo    bvvp    Past Surgical History:  Procedure Laterality Date   BIOPSY  03/06/2019   Procedure: BIOPSY;  Surgeon: Thornton Park, MD;  Location: [facility];  Service: Gastroenterology;;  EGD and COLON   BIOPSY  04/15/2020   Procedure: BIOPSY;  Surgeon: Thornton Park, MD;  Location: [facility];  Service: Gastroenterology;;   CATARACT EXTRACTION  Left 2008   pt unaware   CHOLECYSTECTOMY     COLONOSCOPY WITH PROPOFOL N/A 03/06/2019   Procedure: COLONOSCOPY WITH PROPOFOL;  Surgeon: Thornton Park, MD;  Location: [facility];  Service: Gastroenterology;  Laterality: N/A;   ESOPHAGOGASTRODUODENOSCOPY (EGD) WITH PROPOFOL N/A 03/06/2019   Procedure: ESOPHAGOGASTRODUODENOSCOPY (EGD) WITH PROPOFOL ;  Surgeon: Thornton Park, MD;  Location: [facility];  Service: Gastroenterology;  Laterality: N/A;   ESOPHAGOGASTRODUODENOSCOPY (EGD) WITH PROPOFOL N/A 04/15/2020   Procedure: ESOPHAGOGASTRODUODENOSCOPY (EGD) WITH PROPOFOL;  Surgeon: Thornton Park, MD;  Location: [facility];  Service: Gastroenterology;  Laterality: N/A;   KNEE SURGERY Left 1985   PACEMAKER INSERTION  2014   PARTIAL HYSTERECTOMY  1979   PARTIAL PROCTECTOMY BY TEM N/A 05/10/2019   Procedure: TEM PARTIAL PROCTECTOMY OF RECTAL MASS WITH EXCISION RIGHT PERINEAL SKIN MASS;  Surgeon: Michael Boston, MD;  Location: [facility];  Service: General;  Laterality: N/A;   POLYPECTOMY  03/06/2019   Procedure: POLYPECTOMY;  Surgeon: Thornton Park, MD;  Location: [facility];  Service: Gastroenterology;;   [place]      Prior to Admission medications   Medication Sig Start Date End Date Taking? Authorizing Provider  dexlansoprazole (DEXILANT) 60 MG capsule Take 1 capsule (60 mg total) by mouth daily. 05/08/20  Yes Gatha Mayer, MD  famotidine (PEPCID) 20 MG tablet Take 1 tablet (20 mg total) by mouth 2 (two) times daily. 12/12/20  Yes Thornton Park, MD  levothyroxine (SYNTHROID) 50 MCG tablet Take 1 tablet (50 mcg total) by mouth daily. 12/17/20  Yes Libby Maw, MD  polyethylene glycol (MIRALAX / GLYCOLAX) 17 g packet Take 8.5 g by mouth daily as needed for severe constipation.   Yes [provider]  Probiotic Product (DIGESTIVE ADV DIGESTIVE/IMMUNE) CHEW Chew 2 capsules by mouth at bedtime.   Yes [provider]  Skin  Protectants, Misc. (DRY SKIN EX) Apply 1 application topically daily. Apply to both elbows, knee caps, and feet   Yes [provider]  tamsulosin (FLOMAX) 0.4 MG CAPS capsule Take 0.4 mg by mouth at bedtime. 07/16/19  Yes [provider]  furosemide (LASIX) 20 MG tablet Take 20 mg by mouth daily. Patient not taking: Reported on 01/12/2021    [provider]  linaclotide Rolan Lipa) 145 MCG CAPS capsule Take 1 capsule (145 mcg total) by mouth daily before breakfast. Patient not taking: Reported on 01/12/2021 09/25/20   Thornton Park, MD  spironolactone (ALDACTONE) 25 MG tablet TAKE 1/2 TABLET (12.5 MG TOTAL) BY MOUTH DAILY. Patient not taking: Reported on 01/12/2021 06/18/20   Haydee Salter, MD  tacrolimus (  PROGRAF) 1 MG capsule Take 2 capsules (2 mg total) by mouth daily. ON HOLD D/T RENAL FUNCTION Patient not taking: Reported on 01/12/2021 11/06/20   Thornton Park, MD    Current Facility-Administered Medications  Medication Dose Route Frequency Provider Last Rate Last Admin   0.9 %  sodium chloride infusion   Intravenous PRN Reubin Milan, MD 10 mL/hr at 01/12/21 2033 New Bag at 01/12/21 2033   ciprofloxacin (CIPRO) IVPB 400 mg  400 mg Intravenous Q12H Reubin Milan, MD 200 mL/hr at 01/12/21 2035 400 mg at 01/12/21 2035   fentaNYL (SUBLIMAZE) injection 50 mcg  50 mcg Intravenous Once Reubin Milan, MD       lactated ringers infusion   Intravenous Continuous Reubin Milan, MD   Paused at 01/12/21 1825   MEDLINE mouth rinse  15 mL Mouth Rinse BID Reubin Milan, MD       octreotide (SANDOSTATIN) 500 mcg in sodium chloride 0.9 % 250 mL (2 mcg/mL) infusion  50 mcg/hr Intravenous Continuous Reubin Milan, MD 25 mL/hr at 01/13/21 0218 50 mcg/hr at 01/13/21 0218   ondansetron (ZOFRAN) tablet 4 mg  4 mg Oral Q6H PRN Reubin Milan, MD       Or   ondansetron [facility]) injection 4 mg  4 mg Intravenous Q6H PRN Reubin Milan, MD        [START ON 01/16/2021] pantoprazole (PROTONIX) injection 40 mg  40 mg Intravenous Q12H Reubin Milan, MD       pantoprozole (PROTONIX) 80 mg /NS 100 mL infusion  8 mg/hr Intravenous Continuous Reubin Milan, MD 10 mL/hr at 01/13/21 0220 8 mg/hr at 01/13/21 0220    Allergies as of 01/12/2021 - Review Complete 01/12/2021  Allergen Reaction Noted   Misc. throat products Other (See Comments) 09/05/2012   Neomycin Nausea And Vomiting 05/10/2019   Oxycodone-acetaminophen Rash 09/05/2012   Ambien [zolpidem tartrate] Other (See Comments) 02/21/2013   Amitriptyline Other (See Comments) 02/21/2013   Ampicillin Other (See Comments) 02/21/2013   Anaprox [naproxen sodium] Other (See Comments) 02/21/2013   Benadryl [diphenhydramine hcl (sleep)] Other (See Comments) 02/21/2013   Cetirizine & related Other (See Comments) 02/21/2013   Cortizone-10 [hydrocortisone] Other (See Comments) 02/21/2013   Darvon [propoxyphene] Other (See Comments) 02/21/2013   Diazepam Other (See Comments) 02/21/2013   Diflunisal Swelling and Other (See Comments) 02/21/2013   Duloxetine Other (See Comments) 02/21/2013   Flexeril [cyclobenzaprine] Other (See Comments) 02/21/2013   Lactose intolerance (gi) Swelling and Other (See Comments) 08/23/2014   Lidocaine Other (See Comments) 02/21/2013   Meperidine and related Other (See Comments) 02/21/2013   Metanx [l-methylfolate-algae-b12-b6] Other (See Comments) 02/21/2013   Metoclopramide Other (See Comments) 02/21/2013   Morphine and related Other (See Comments) 02/21/2013   Nuprin [ibuprofen] Other (See Comments) 02/21/2013   Oxycodone Other (See Comments) 02/21/2013   Penicillins Other (See Comments) 02/21/2013   Propoxyphene Other (See Comments) 02/21/2013   Ranitidine Other (See Comments) 02/21/2013   Vicodin [hydrocodone-acetaminophen] Other (See Comments) 02/21/2013   Butenafine hcl Rash 01/12/2021   Tramadol Other (See Comments) 05/17/2017   Tylenol  [acetaminophen] Rash 02/21/2013   Zolpidem Other (See Comments) and Rash 09/05/2012    Family History  Problem Relation Age of Onset   Heart attack Father    Heart disease Paternal Uncle    Heart disease Paternal Grandmother    Diabetes Paternal Grandmother    Heart disease Paternal Grandfather    Breast cancer Mother    Cancer  Maternal Grandmother    CVA Maternal Grandmother    Cancer Maternal Grandfather    CVA Maternal Grandfather    Diabetes Sister    Diabetes Brother    Diabetes Brother    Stroke Daughter     Social History   Socioeconomic History   Marital status: Widowed    Spouse name: Not on file   Number of children: Not on file   Years of education: Not on file   Highest education level: Not on file  Occupational History   Occupation: disable  Tobacco Use   Smoking status: Former    Types: Cigarettes    Quit date: 03/02/2003    Years since quitting: 17.8   Smokeless tobacco: Never  Vaping Use   Vaping Use: Never used  Substance and Sexual Activity   Alcohol use: No   Drug use: No   Sexual activity: Not on file  Other Topics Concern   Not on file  Social History Narrative   Lives alone, "have friends and relatives that come and help me"   caffeine use- coffee -1 cup daily   Social Determinants of Health   Financial Resource Strain: Not on file  Food Insecurity: Not on file  Transportation Needs: Not on file  Physical Activity: Not on file  Stress: Not on file  Social Connections: Not on file  Intimate Partner Violence: Not on file   Review of Systems: Gen: Denies fever, sweats or chills. No weight loss.  CV: Denies chest pain, palpitations or edema. Resp: + SOB.  GI:See HPI.  GU : Denies urinary burning, blood in urine, increased urinary frequency or incontinence. MS: + Back pain.  Derm: Denies rash, itchiness, skin lesions or unhealing ulcers. Psych: + Anxiety and depression.  Heme: Denies easy bruising, bleeding. Neuro:  Denies  headaches, dizziness or paresthesias. Endo:  Denies any problems with DM, thyroid or adrenal function.  Physical Exam: Vital signs in last 24 hours: Temp:  [97.7 F (36.5 C)-98.4 F (36.9 C)] 97.7 F (36.5 C) (12/20 0500) Pulse Rate:  [71-98] 75 (12/20 0500) Resp:  [12-25] 20 (12/20 0500) BP: (83-131)/(49-85) 83/49 (12/20 0500) SpO2:  [90 %-100 %] 100 % (12/20 0500) Weight:  [80.3 kg] 80.3 kg (12/19 1147) Last BM Date: 01/12/21 General: Ill-appearing 75 year old female in no acute distress. Head:  Normocephalic and atraumatic. Eyes:  No scleral icterus. Conjunctiva pink. Ears:  Normal auditory acuity. Nose:  No deformity, discharge or lesions. Mouth: Absent dentition.  No ulcers or lesions.  Neck:  Supple. No lymphadenopathy or thyromegaly.  Lungs: Breath sounds clear, diminished in the bases. Heart: Rate and rhythm, no murmurs. Abdomen: Upper abdomen is moderately distended with ascites with gaseous distension component, lower abdomen much less distended.d moderate upper abdominal tenderness, less lower abdominal tenderness without rebound or guarding.  No obvious hepatosplenomegaly in the setting of a distended abdomen.  Positive bowel sounds to all 4 quadrants. Rectal: Noninflamed nonbleeding external hemorrhoids.  Scant amount of light brown stool in the rectal vault.  No blood or melena.  RN at the bedside at time of exam. Musculoskeletal:  Symmetrical without gross deformities.  Pulses:  Normal pulses noted. Extremities:  Bilateral lower extremities with 1 + pitting edema.  Neurologic:  Alert and  oriented x4. No focal deficits.  Answers questions appropriately.  Speech is clear.  Moves all extremities. Skin:  Intact without significant lesions or rashes. Psych:  Alert and cooperative. Normal mood and affect.  Intake/Output from previous day: 12/19 0701 -  12/20 0700 In: 1561.2 [I.V.:1211.2; IV Piggyback:350] Out: -  Intake/Output this shift: No intake/output data  recorded.  Lab Results: Recent Labs    01/12/21 1153 01/13/21 0423  WBC 2.5* 1.5*  HGB 10.1* 7.8*  HCT 29.6* 23.9*  PLT 51* 37*   BMET Recent Labs    01/12/21 1153 01/13/21 0423  NA 131* 132*  K 4.1 4.2  CL 106 106  CO2 22 20*  GLUCOSE 99 94  BUN 18 16  CREATININE 1.22* 1.22*  CALCIUM 7.8* 7.7*   LFT Recent Labs    01/13/21 0423  PROT 4.6*  ALBUMIN 2.1*  AST 40  ALT 24  ALKPHOS 72  BILITOT 1.6*   PT/INR Recent Labs    01/12/21 1256 01/13/21 0423  LABPROT 25.7* 34.3*  INR 2.4* 3.4*   Hepatitis Panel No results for input(s): HEPBSAG, HCVAB, HEPAIGM, HEPBIGM in the last 72 hours.    Studies/Results: CT Abdomen Pelvis Wo Contrast  Result Date: 01/12/2021 CLINICAL DATA:  Abdominal pain and dyspnea since last night, dark stools, blood in stool, abdominal distension for 1 month; history cirrhosis, drug-induced parkinsonism, GERD, stage IV chronic kidney disease EXAM: CT ABDOMEN AND PELVIS WITHOUT CONTRAST TECHNIQUE: Multidetector CT imaging of the abdomen and pelvis was performed following the standard protocol without IV contrast. COMPARISON:  11/14/2019 FINDINGS: Lower chest: Lung bases clear.  RIGHT ventricular pacemaker lead. Hepatobiliary: Liver small without focal mass. Post cholecystectomy. Pancreas: Atrophic pancreas without mass Spleen: Normal appearance Adrenals/Urinary Tract: Adrenal glands normal appearance. Peripelvic cyst central LEFT kidney again seen 2.6 x 2.8 cm. Tiny hyperdense nodule lower LEFT kidney laterally, corresponding to a small cyst seen on the previous exam, decreased in size but increased in attenuation. Bladder decompressed. Stomach/Bowel: Displacement of bowel loops by ascites. Sigmoid diverticulosis without evidence of diverticulitis. Stomach and remaining bowel loops unremarkable. Vascular/Lymphatic: Atherosclerotic calcifications aorta and iliac arteries without aneurysm. No adenopathy. Perisplenic collaterals. Reproductive: Uterus  surgically absent. Nodular soft tissue foci in the pelvis bilaterally likely represent the ovaries as noted on prior exam. Other: Marked ascites. Diffuse soft tissue edema. No free air. No hernia. Musculoskeletal: Osseous demineralization. Degenerative changes LEFT hip joint. IMPRESSION: Marked ascites. Small liver consistent with known cirrhosis with note of perisplenic collaterals. Sigmoid diverticulosis without evidence of diverticulitis. Aortic Atherosclerosis (ICD10-I70.0). Electronically Signed   By: Lavonia Dana M.D.   On: 01/12/2021 13:23   US Paracentesis  Result Date: 01/12/2021 INDICATION: Autoimmune hepatitis.  Ascites. EXAM: ULTRASOUND GUIDED  PARACENTESIS MEDICATIONS: None. COMPLICATIONS: None immediate. PROCEDURE: Informed written consent was obtained from the patient after a discussion of the risks, benefits and alternatives to treatment. A timeout was performed prior to the initiation of the procedure. Initial ultrasound scanning demonstrates a large amount of ascites within the right lower abdominal quadrant. The right lower abdomen was prepped and draped in the usual sterile fashion. 1% tetracaine was used for local anesthesia. Following this, a Yueh catheter was introduced. An ultrasound image was saved for documentation purposes. The paracentesis was performed. The catheter was removed and a dressing was applied. The patient tolerated the procedure well without immediate post procedural complication. Patient received post-procedure intravenous albumin; see nursing notes for details. Procedure was performed by Rowe Robert, PA-C. FINDINGS: A total of approximately 3.5 L of yellow fluid was removed. Samples were sent to the laboratory as requested by the clinical team. IMPRESSION: Successful ultrasound-guided paracentesis yielding 3.5 liters of peritoneal fluid. Electronically Signed   By: Markus Daft M.D.   On: 01/12/2021 17:34  DG Chest Port 1 View  Result Date: 01/12/2021 CLINICAL  DATA:  Shortness of breath EXAM: PORTABLE CHEST 1 VIEW COMPARISON:  Chest radiograph dated August 31, 2019 FINDINGS: The heart size and mediastinal contours are within normal limits. Pacemaker leads terminating in the right atrium and right ventricle. Both lungs are clear. The visualized skeletal structures are unremarkable. IMPRESSION: No active disease. Electronically Signed   By: Keane Police D.O.   On: 01/12/2021 12:57    IMPRESSION/PLAN:  106) 75 year old female with autoimmune hepatitis with decompensated cirrhosis, ascites, esophageal varices and pancytopenia admitted to the hospital 01/12/2021 with generalized abdominal pain, rectal bleeding and melena x 1 day. Started on PPI infusion and Octreotide infusion. Admission Hg 10.1 (baseline Hg 9 - 10.07) -> today Hg 7.8. CTAP wo contrast showed cirrhosis with marked ascites. S/P paracentesis, 3.5 L of peritoneal fluid removed.  No SBP. BP 83- 90's/50's. No active GI bleeding since admission.  No overt hepatic encephalopathy.  Off Prograf since 11/06/2020.  LFTs stable.  Last IgG level was 3,077 on 12/15.  -NPO -EGD and flex sig this afternoon with Dr. Lorenso Courier -Water enema x 1 in a.m. -Continue Cipro 412m IV bid for prophylaxis in the setting of GI bleeding  -Continue Octreotide infusion -Continue Pantoprazole infusion -Monitory H/H closely -Repeat BP now  -Transfuse for Hg level < 7.0 -Pain management per the hospitalist -She will require outpatient follow up with Dr. BTarri Glennregarding management of autoimmune hepatitis  2) Pancytopenia. WBC 1.5. Hg 7.8. PLT 37. CTAP without evidence of splenomegaly.  -Transfuse 1 pack of platelets today prior to EGD and flex sigmoidoscopy   3) Coagulopathy secondary to cirrhosis. INR 2.4 -> 3.4.  -Daily INR  4) GERD, history of gastric ulcers -See plan in # 1  5) History of adenomatous and tubulovillous colon polyps -Next surveillance colonoscopy due 02/2018  6) History of a stable pancreatic tail  cyst  7) CKD    CNoralyn Pick 01/13/2021, 10:19AM

## 2021-01-13 NOTE — Anesthesia Postprocedure Evaluation (Signed)
Anesthesia Post Note  Patient: Stacey Velazquez  Procedure(s) Performed: ESOPHAGOGASTRODUODENOSCOPY (EGD) FLEXIBLE SIGMOIDOSCOPY BIOPSY     Patient location during evaluation: PACU Anesthesia Type: MAC Level of consciousness: awake and alert and oriented Pain management: pain level controlled Vital Signs Assessment: post-procedure vital signs reviewed and stable Respiratory status: spontaneous breathing, nonlabored ventilation and respiratory function stable Cardiovascular status: blood pressure returned to baseline Postop Assessment: no apparent nausea or vomiting Anesthetic complications: no   No notable events documented.  Last Vitals:  Vitals:   01/13/21 1451 01/13/21 1452  BP: (!) 86/53 (!) 110/55  Pulse: 70 69  Resp: 16 12  Temp:    SpO2: 99% 100%    Last Pain:  Vitals:   01/13/21 1440  TempSrc: Axillary  PainSc:                  [name]

## 2021-01-13 NOTE — Anesthesia Procedure Notes (Signed)
Procedure Name: MAC Date/Time: 01/13/2021 1:42 PM Performed by: West Pugh, CRNA Pre-anesthesia Checklist: Patient identified, Emergency Drugs available, Suction available, Patient being monitored and Timeout performed Patient Re-evaluated:Patient Re-evaluated prior to induction Oxygen Delivery Method: Simple face mask Preoxygenation: Pre-oxygenation with 100% oxygen Induction Type: IV induction Placement Confirmation: positive ETCO2 Dental Injury: Teeth and Oropharynx as per pre-operative assessment

## 2021-01-13 NOTE — Op Note (Signed)
[facility] Patient Name: Stacey Velazquez Procedure Date: 01/13/2021 MRN: [id number] Attending MD: Georgian Co ,  Date of Birth: 07-04-1945 CSN: [id number] Age: 75 Admit Type: Inpatient Procedure:                Flexible Sigmoidoscopy Indications:              Hematochezia, Melena Providers:                Adline Mango" Lady Deutscher, RN, Lodema Hong Technician, Technician Referring MD:             Hospitalist team Medicines:                Monitored Anesthesia Care Complications:            No immediate complications. Estimated Blood Loss:     Estimated blood loss: none. Procedure:                Pre-Anesthesia Assessment:                           - Prior to the procedure, a History and Physical                            was performed, and patient medications and                            allergies were reviewed. The patient's tolerance of                            previous anesthesia was also reviewed. The risks                            and benefits of the procedure and the sedation                            options and risks were discussed with the patient.                            All questions were answered, and informed consent                            was obtained. Prior Anticoagulants: The patient has                            taken no previous anticoagulant or antiplatelet                            agents. ASA Grade Assessment: III - A patient with                            severe systemic disease. After reviewing the risks  and benefits, the patient was deemed in                            satisfactory condition to undergo the procedure.                           After obtaining informed consent, the scope was                            passed under direct vision. The [id number]-[id number] ([id number])                            Olympus endoscope was introduced through the anus                             and advanced to the the left transverse colon. The                            flexible sigmoidoscopy was accomplished without                            difficulty. The patient tolerated the procedure                            well. Scope In: 2:07:36 PM Scope Out: 2:27:06 PM Total Procedure Duration: 0 hours 19 minutes 30 seconds  Findings:      Stool was found in the rectum, in the sigmoid colon, in the descending       colon and in the transverse colon, making visualization difficult.      Multiple small and large-mouthed diverticula were found in the sigmoid       colon, descending colon and transverse colon. Not removed due to       presentation for bleeding.      A tattoo was seen in the transverse colon.      Multiple sessile, non-bleeding polyps were found in the sigmoid colon,       descending colon and transverse colon.      Non-bleeding internal hemorrhoids were found during retroflexion. Impression:               - Stool in the rectum, in the sigmoid colon, in the                            descending colon and in the transverse colon.                           - Diverticulosis in the sigmoid colon, in the                            descending colon and in the transverse colon.                           - A tattoo was seen in the transverse colon.                           -  Multiple, non-bleeding polyps in the sigmoid                            colon, in the descending colon and in the                            transverse colon. Not removed due to presentation                            for bleeding.                           - Non-bleeding internal hemorrhoids.                           - No specimens collected. Moderate Sedation:      Not Applicable - Patient had care per Anesthesia. Recommendation:           - Return patient to hospital ward for ongoing care.                           - No obvious source of active bleeding was found,                             though patient was noted to have PHG, gastritis,                            diverticuli, and hemorrhoids, which could have been                            sources of prior bleeding. Her esophageal varices                            did not have any stigmata of recent bleeding.                           - Okay to discontinue octreotide gtt. Recommend                            completing 7 day course of antibiotic therapy with                            ceftriaxone or ciprofloxacin. Okay to empirically                            treat for 4 weeks with PPI BID in the setting of                            gastritis.                           - Patient can follow up with her primary GI  physician Dr. Tarri Glenn to discuss the risks and                            benefits of colonoscopy in the future for removal                            of colon polyps.                           - The findings and recommendations were discussed                            with the patient. Procedure Code(s):        --- Professional ---                           [phone], Sigmoidoscopy, flexible; diagnostic,                            including collection of specimen(s) by brushing or                            washing, when performed (separate procedure) Diagnosis Code(s):        --- Professional ---                           K64.8, Other hemorrhoids                           K63.5, Polyp of colon                           K92.1, Melena (includes Hematochezia)                           K57.30, Diverticulosis of large intestine without                            perforation or abscess without bleeding CPT copyright 2019 [employer]. All rights reserved. The codes documented in this report are preliminary and upon coder review may  be revised to meet current compliance requirements. [address]Christia Reading,  01/13/2021 2:42:53 PM Number of Addenda: 0

## 2021-01-13 NOTE — Progress Notes (Signed)
Patient has not voided since in and out cath at 0930, bladder scan performed, read as 697.  Patient has no urge to void. Notified MD, order to insert urethral catheter obtained.   1630 Inserted 16 french foley w/o difficulty, immediate return of yellow urine.

## 2021-01-13 NOTE — Progress Notes (Signed)
Bladder scan read as 668, In and out cath performed.  Obtained approximately 600 mls yellow urine.  Patient tolerated well.

## 2021-01-13 NOTE — H&P (View-Only) (Signed)
Referring Provider: Dr. Tennis Must Primary Care Physician:  Libby Maw, MD Primary Gastroenterologist:  Dr. Thornton Park  Reason for Consultation: Autoimmune hepatitis  HPI: Stacey Velazquez is a 75 y.o. female with a past medical history of anxiety, depression, fibromyalgia, hypertension, sick sinus syndrome s/p pacemaker, coronary artery disease s/p MI, CVA, CKD, GERD, gastric ulcer 02/2019, tubular adenomatous and tubulovillous colon polyps, constipation, pancreatic tail cystic lesion, autoimmune hepatitis with cirrhosis (MELD 11), esophageal varices, portal hypertensive and pancytopenia.    She was initially diagnosed with autoimmune hepatitis per liver biopsy in 2006 at [facility].  She was initially treated with Azathioprine which was discontinued 02/2019 secondary to leukopenia.  At that time, she was transitioned to Prograf. She was evaluated by Roosevelt Locks NP at [facility] 06/05/2020 with recommendations to continue program as her LFTs and IgG levels somewhat improved on this regimen.  Steroids were deferred secondary to her advanced age.  No plans for liver transplant.  She is followed by Dr. Tarri Glenn, last seen in office for routine follow up 10/28/2020.  At that time, she remained on Prograf 3 mg daily and she was advised to follow-up up in office in 3 to 4 months with plans for a surveillance abdominal MRI 02/2021.   She was instructed by Dr. Tarri Glenn to hold Prograf for 2 days due to a rising creatinine level on 11/06/2020 then restart the Prograf in 2 day which was not done. She was also suspected to have a  UTI which was recently treated with Levofloxacin 742m po QOD x 10 days. [facility] She was seen by her nephrologist Dr. SCandiss Norse1 week ago and she was prescribed Furosemide 284mQD which she did not yet pick up from her pharmacy.   She contacted Dr. BeTarri Glennn 01/12/2021 with complaints of rectal bleeding with melenic stools x 1 day with abdominal  distention with lower extremity edema for the past month.  She was advised to go to the ED for further evaluation.  Labs in the ED showed a sodium level 131.  Potassium 4.1.  BUN 18.  Creatinine 1.22 (Cr 1.18 on 01/08/2021).  Calcium 7.8.  Alk phos 105.  Total bili 1.6.  AST 56.  ALT 37.  WBC 2.5.  Hematocrit 29.6.  MCV 91.1.  Platelet 51.  INR 2.4. Today INR 3.4. S/P paracentesis 01/12/2021, 3.5 L peritoneal fluid removed. No SBP.  Gram stain without WBC or other organisms.  Cultures pending. Started on Albumin IV 25gm IV Q 6 hrs. CTAP wo contrast identified a moderate amount of ascites, cirrhosis with perisplenic collaterals and sigmoid diverticulosis without evidence of diverticulitis. She was started on Cipro 40058mV for prophylaxis in the setting of GI bleeding.   She reported having upper and lower abdominal pain which has aggressively worsened over the past 2 to 4 weeks with associated abdominal distention and swelling in her legs.  She was seen by her nephrologist Dr. SinCandiss Norseweek ago and she was prescribed Furosemide 40m55m which she did not yet pick up from her pharmacy.  Her abdominal cramping pain worsened on Monday, 01/12/2021 and she reported passing a small black-colored stool with a small amount of dark red blood on the stool and bright red blood on the toilet tissue.  No further rectal bleeding since admission.  No NSAID use.  She is not on any anticoagulation.  No alcohol use.  No nausea or vomiting.  She has frequent heartburn for which  she takes Famotidine daily. No longer taking a PPI.   Recent IMAGE STUDIES:  CTAP without contrast 01/12/2021: Marked ascites.  Small liver consistent with known cirrhosis with note of perisplenic collaterals.  Sigmoid diverticulosis without evidence of diverticulitis.  Aortic Atherosclerosis   Chest xray 01/12/2021: No active disease.  Abdominal MRI with and without contrast 09/08/2020: Cirrhosis.  No findings suspicious for HCC.  Portal vein is  patent. Recanalized periumbilical vein and splenorenal shunt. Splenomegaly. No abdominal ascites.  10 mm unilocular cyst along the pancreatic tail, likely reflecting a benign side branch IPMN or pseudocyst  PAST GI PROCEDURES:  EGD 03/06/19: Grade II varices were found in the middle third of the esophagus. Moderate portal hypertensive gastropathy. Gastritis. One non-bleeding cratered gastric ulcer with no stigmata of bleeding was found on the lesser curvature of the stomach. There was no H pylori.  Colonoscopy with Dr. Benson Norway 02/10/2016 showed 6 polyps.  4 polyps ranged from 3 to 6 mm were removed and the descending: And transverse colon.  There were 2 polyps ranging from 30 to 40 mm that were biopsied and tattooed in the ascending colon and transverse.  Pathology report showed tubulous villous adenoma of high-grade dysplasia.  The patient was referred to [facility].   Colonoscopy with Dr. Stephanie Acre at [facility] 05/26/16: "The perianal and digital rectal examinations were normal. A greater than 50 mm polyp was found in the proximal ascending colon. The polyp was sessile. Preparations were made for mucosal resection. Saline with methylene blue was injected to raise the lesion. Snare mucosal resection was performed. Resection and retrieval were complete. Coagulation for tissue destruction using snare was successful. A 50 mm polyp was found in the hepatic flexure. The polyp was semi-pedunculated. Preparations were made for mucosal resection. Saline with methylene blue was injected to raise the lesion. Snare mucosal resection was performed. Resection and retrieval were complete. Coagulation for tissue destruction using snare was successful. The exam was otherwise without abnormality on direct and retroflexion  views." Pathology revealed Tubulovillous adenoma (multiple fragments) with multiple foci of high-grade dysplasia.  Surveillance colonoscopy recommended in 1 year.  Colonoscopy 03/06/19 showed a 20 mm polyp in the distal rectum  adjacent to the dentate line. Biopsies were consistent with tubulovillous adenoma with high-grade grandular dysplasia. She also had three 1 to 4 mm tubular adenomas and two hyperplastic polyps removed at that time.  TEM partial proctectomy of bleeding rectal mass with Dr. Johney Maine.  Pathology revealed a 2.6 cm tubulovillous adenoma.  Margins were uninvolved by dysplasia. EGD 04/15/20 showed 4 columns of grade 2 varices, portal hypertensive gastropathy, gastritis.  Gastric erosion had resolved.  Past Medical History:  Diagnosis Date   Adenomatous polyp of ascending colon    Anxiety    Bilateral lower extremity edema    burning sensations   Bipolar 1 disorder (HCC)    Carpal tunnel syndrome    Cirrhosis of liver (HCC)    CKD (chronic kidney disease) stage 4, GFR 15-29 ml/min (HCC) 11/04/2020   Cutaneous horn    Drug-induced parkinsonism ([place])    patient unaware   Esophageal varices ([place])    Fibromyalgia    patient denies   Gastric ulcer    GERD (gastroesophageal reflux disease)    Gingival abscess 08/26/2016   Glaucoma 2003   pt unaware   Heart attack ([place])    Heart disease    Hepatitis, autoimmune ([place]) 08/25/2014   History of iron deficiency anemia    Hypotension    Hypothyroidism  Migraines    Neutropenia ([place])    Osteoporosis    Pacemaker Med[employer]onic    MRI compatible   Paranoid schizophrenia ([place])    Polyarthritis    Polyosteoarthritis    Rectal mass    Seizures ([place])    last sz 08/21/14   Sinus arrest    Skin cancer    forehead   SSS (sick sinus syndrome) ([place])    Stroke ([place])    Thrombocytopenia ([place])    Vertigo    bvvp    Past Surgical History:  Procedure Laterality Date   BIOPSY  03/06/2019   Procedure: BIOPSY;  Surgeon: Thornton Park, MD;  Location: [facility];  Service: Gastroenterology;;  EGD and COLON   BIOPSY  04/15/2020   Procedure: BIOPSY;  Surgeon: Thornton Park, MD;  Location: [facility];  Service: Gastroenterology;;   CATARACT EXTRACTION  Left 2008   pt unaware   CHOLECYSTECTOMY     COLONOSCOPY WITH PROPOFOL N/A 03/06/2019   Procedure: COLONOSCOPY WITH PROPOFOL;  Surgeon: Thornton Park, MD;  Location: [facility];  Service: Gastroenterology;  Laterality: N/A;   ESOPHAGOGASTRODUODENOSCOPY (EGD) WITH PROPOFOL N/A 03/06/2019   Procedure: ESOPHAGOGASTRODUODENOSCOPY (EGD) WITH PROPOFOL ;  Surgeon: Thornton Park, MD;  Location: [facility];  Service: Gastroenterology;  Laterality: N/A;   ESOPHAGOGASTRODUODENOSCOPY (EGD) WITH PROPOFOL N/A 04/15/2020   Procedure: ESOPHAGOGASTRODUODENOSCOPY (EGD) WITH PROPOFOL;  Surgeon: Thornton Park, MD;  Location: [facility];  Service: Gastroenterology;  Laterality: N/A;   KNEE SURGERY Left 1985   PACEMAKER INSERTION  2014   PARTIAL HYSTERECTOMY  1979   PARTIAL PROCTECTOMY BY TEM N/A 05/10/2019   Procedure: TEM PARTIAL PROCTECTOMY OF RECTAL MASS WITH EXCISION RIGHT PERINEAL SKIN MASS;  Surgeon: Michael Boston, MD;  Location: [facility];  Service: General;  Laterality: N/A;   POLYPECTOMY  03/06/2019   Procedure: POLYPECTOMY;  Surgeon: Thornton Park, MD;  Location: [facility];  Service: Gastroenterology;;   [place]      Prior to Admission medications   Medication Sig Start Date End Date Taking? Authorizing Provider  dexlansoprazole (DEXILANT) 60 MG capsule Take 1 capsule (60 mg total) by mouth daily. 05/08/20  Yes Gatha Mayer, MD  famotidine (PEPCID) 20 MG tablet Take 1 tablet (20 mg total) by mouth 2 (two) times daily. 12/12/20  Yes Thornton Park, MD  levothyroxine (SYNTHROID) 50 MCG tablet Take 1 tablet (50 mcg total) by mouth daily. 12/17/20  Yes Libby Maw, MD  polyethylene glycol (MIRALAX / GLYCOLAX) 17 g packet Take 8.5 g by mouth daily as needed for severe constipation.   Yes [provider]  Probiotic Product (DIGESTIVE ADV DIGESTIVE/IMMUNE) CHEW Chew 2 capsules by mouth at bedtime.   Yes [provider]  Skin  Protectants, Misc. (DRY SKIN EX) Apply 1 application topically daily. Apply to both elbows, knee caps, and feet   Yes [provider]  tamsulosin (FLOMAX) 0.4 MG CAPS capsule Take 0.4 mg by mouth at bedtime. 07/16/19  Yes [provider]  furosemide (LASIX) 20 MG tablet Take 20 mg by mouth daily. Patient not taking: Reported on 01/12/2021    [provider]  linaclotide Rolan Lipa) 145 MCG CAPS capsule Take 1 capsule (145 mcg total) by mouth daily before breakfast. Patient not taking: Reported on 01/12/2021 09/25/20   Thornton Park, MD  spironolactone (ALDACTONE) 25 MG tablet TAKE 1/2 TABLET (12.5 MG TOTAL) BY MOUTH DAILY. Patient not taking: Reported on 01/12/2021 06/18/20   Haydee Salter, MD  tacrolimus (  PROGRAF) 1 MG capsule Take 2 capsules (2 mg total) by mouth daily. ON HOLD D/T RENAL FUNCTION Patient not taking: Reported on 01/12/2021 11/06/20   Thornton Park, MD    Current Facility-Administered Medications  Medication Dose Route Frequency Provider Last Rate Last Admin   0.9 %  sodium chloride infusion   Intravenous PRN Reubin Milan, MD 10 mL/hr at 01/12/21 2033 New Bag at 01/12/21 2033   ciprofloxacin (CIPRO) IVPB 400 mg  400 mg Intravenous Q12H Reubin Milan, MD 200 mL/hr at 01/12/21 2035 400 mg at 01/12/21 2035   fentaNYL (SUBLIMAZE) injection 50 mcg  50 mcg Intravenous Once Reubin Milan, MD       lactated ringers infusion   Intravenous Continuous Reubin Milan, MD   Paused at 01/12/21 1825   MEDLINE mouth rinse  15 mL Mouth Rinse BID Reubin Milan, MD       octreotide (SANDOSTATIN) 500 mcg in sodium chloride 0.9 % 250 mL (2 mcg/mL) infusion  50 mcg/hr Intravenous Continuous Reubin Milan, MD 25 mL/hr at 01/13/21 0218 50 mcg/hr at 01/13/21 0218   ondansetron (ZOFRAN) tablet 4 mg  4 mg Oral Q6H PRN Reubin Milan, MD       Or   ondansetron [facility]) injection 4 mg  4 mg Intravenous Q6H PRN Reubin Milan, MD        [START ON 01/16/2021] pantoprazole (PROTONIX) injection 40 mg  40 mg Intravenous Q12H Reubin Milan, MD       pantoprozole (PROTONIX) 80 mg /NS 100 mL infusion  8 mg/hr Intravenous Continuous Reubin Milan, MD 10 mL/hr at 01/13/21 0220 8 mg/hr at 01/13/21 0220    Allergies as of 01/12/2021 - Review Complete 01/12/2021  Allergen Reaction Noted   Misc. throat products Other (See Comments) 09/05/2012   Neomycin Nausea And Vomiting 05/10/2019   Oxycodone-acetaminophen Rash 09/05/2012   Ambien [zolpidem tartrate] Other (See Comments) 02/21/2013   Amitriptyline Other (See Comments) 02/21/2013   Ampicillin Other (See Comments) 02/21/2013   Anaprox [naproxen sodium] Other (See Comments) 02/21/2013   Benadryl [diphenhydramine hcl (sleep)] Other (See Comments) 02/21/2013   Cetirizine & related Other (See Comments) 02/21/2013   Cortizone-10 [hydrocortisone] Other (See Comments) 02/21/2013   Darvon [propoxyphene] Other (See Comments) 02/21/2013   Diazepam Other (See Comments) 02/21/2013   Diflunisal Swelling and Other (See Comments) 02/21/2013   Duloxetine Other (See Comments) 02/21/2013   Flexeril [cyclobenzaprine] Other (See Comments) 02/21/2013   Lactose intolerance (gi) Swelling and Other (See Comments) 08/23/2014   Lidocaine Other (See Comments) 02/21/2013   Meperidine and related Other (See Comments) 02/21/2013   Metanx [l-methylfolate-algae-b12-b6] Other (See Comments) 02/21/2013   Metoclopramide Other (See Comments) 02/21/2013   Morphine and related Other (See Comments) 02/21/2013   Nuprin [ibuprofen] Other (See Comments) 02/21/2013   Oxycodone Other (See Comments) 02/21/2013   Penicillins Other (See Comments) 02/21/2013   Propoxyphene Other (See Comments) 02/21/2013   Ranitidine Other (See Comments) 02/21/2013   Vicodin [hydrocodone-acetaminophen] Other (See Comments) 02/21/2013   Butenafine hcl Rash 01/12/2021   Tramadol Other (See Comments) 05/17/2017   Tylenol  [acetaminophen] Rash 02/21/2013   Zolpidem Other (See Comments) and Rash 09/05/2012    Family History  Problem Relation Age of Onset   Heart attack Father    Heart disease Paternal Uncle    Heart disease Paternal Grandmother    Diabetes Paternal Grandmother    Heart disease Paternal Grandfather    Breast cancer Mother    Cancer  Maternal Grandmother    CVA Maternal Grandmother    Cancer Maternal Grandfather    CVA Maternal Grandfather    Diabetes Sister    Diabetes Brother    Diabetes Brother    Stroke Daughter     Social History   Socioeconomic History   Marital status: Widowed    Spouse name: Not on file   Number of children: Not on file   Years of education: Not on file   Highest education level: Not on file  Occupational History   Occupation: disable  Tobacco Use   Smoking status: Former    Types: Cigarettes    Quit date: 03/02/2003    Years since quitting: 17.8   Smokeless tobacco: Never  Vaping Use   Vaping Use: Never used  Substance and Sexual Activity   Alcohol use: No   Drug use: No   Sexual activity: Not on file  Other Topics Concern   Not on file  Social History Narrative   Lives alone, "have friends and relatives that come and help me"   caffeine use- coffee -1 cup daily   Social Determinants of Health   Financial Resource Strain: Not on file  Food Insecurity: Not on file  Transportation Needs: Not on file  Physical Activity: Not on file  Stress: Not on file  Social Connections: Not on file  Intimate Partner Violence: Not on file   Review of Systems: Gen: Denies fever, sweats or chills. No weight loss.  CV: Denies chest pain, palpitations or edema. Resp: + SOB.  GI:See HPI.  GU : Denies urinary burning, blood in urine, increased urinary frequency or incontinence. MS: + Back pain.  Derm: Denies rash, itchiness, skin lesions or unhealing ulcers. Psych: + Anxiety and depression.  Heme: Denies easy bruising, bleeding. Neuro:  Denies  headaches, dizziness or paresthesias. Endo:  Denies any problems with DM, thyroid or adrenal function.  Physical Exam: Vital signs in last 24 hours: Temp:  [97.7 F (36.5 C)-98.4 F (36.9 C)] 97.7 F (36.5 C) (12/20 0500) Pulse Rate:  [71-98] 75 (12/20 0500) Resp:  [12-25] 20 (12/20 0500) BP: (83-131)/(49-85) 83/49 (12/20 0500) SpO2:  [90 %-100 %] 100 % (12/20 0500) Weight:  [80.3 kg] 80.3 kg (12/19 1147) Last BM Date: 01/12/21 General: Ill-appearing 75 year old female in no acute distress. Head:  Normocephalic and atraumatic. Eyes:  No scleral icterus. Conjunctiva pink. Ears:  Normal auditory acuity. Nose:  No deformity, discharge or lesions. Mouth: Absent dentition.  No ulcers or lesions.  Neck:  Supple. No lymphadenopathy or thyromegaly.  Lungs: Breath sounds clear, diminished in the bases. Heart: Rate and rhythm, no murmurs. Abdomen: Upper abdomen is moderately distended with ascites with gaseous distension component, lower abdomen much less distended.d moderate upper abdominal tenderness, less lower abdominal tenderness without rebound or guarding.  No obvious hepatosplenomegaly in the setting of a distended abdomen.  Positive bowel sounds to all 4 quadrants. Rectal: Noninflamed nonbleeding external hemorrhoids.  Scant amount of light brown stool in the rectal vault.  No blood or melena.  RN at the bedside at time of exam. Musculoskeletal:  Symmetrical without gross deformities.  Pulses:  Normal pulses noted. Extremities:  Bilateral lower extremities with 1 + pitting edema.  Neurologic:  Alert and  oriented x4. No focal deficits.  Answers questions appropriately.  Speech is clear.  Moves all extremities. Skin:  Intact without significant lesions or rashes. Psych:  Alert and cooperative. Normal mood and affect.  Intake/Output from previous day: 12/19 0701 -  12/20 0700 In: 1561.2 [I.V.:1211.2; IV Piggyback:350] Out: -  Intake/Output this shift: No intake/output data  recorded.  Lab Results: Recent Labs    01/12/21 1153 01/13/21 0423  WBC 2.5* 1.5*  HGB 10.1* 7.8*  HCT 29.6* 23.9*  PLT 51* 37*   BMET Recent Labs    01/12/21 1153 01/13/21 0423  NA 131* 132*  K 4.1 4.2  CL 106 106  CO2 22 20*  GLUCOSE 99 94  BUN 18 16  CREATININE 1.22* 1.22*  CALCIUM 7.8* 7.7*   LFT Recent Labs    01/13/21 0423  PROT 4.6*  ALBUMIN 2.1*  AST 40  ALT 24  ALKPHOS 72  BILITOT 1.6*   PT/INR Recent Labs    01/12/21 1256 01/13/21 0423  LABPROT 25.7* 34.3*  INR 2.4* 3.4*   Hepatitis Panel No results for input(s): HEPBSAG, HCVAB, HEPAIGM, HEPBIGM in the last 72 hours.    Studies/Results: CT Abdomen Pelvis Wo Contrast  Result Date: 01/12/2021 CLINICAL DATA:  Abdominal pain and dyspnea since last night, dark stools, blood in stool, abdominal distension for 1 month; history cirrhosis, drug-induced parkinsonism, GERD, stage IV chronic kidney disease EXAM: CT ABDOMEN AND PELVIS WITHOUT CONTRAST TECHNIQUE: Multidetector CT imaging of the abdomen and pelvis was performed following the standard protocol without IV contrast. COMPARISON:  11/14/2019 FINDINGS: Lower chest: Lung bases clear.  RIGHT ventricular pacemaker lead. Hepatobiliary: Liver small without focal mass. Post cholecystectomy. Pancreas: Atrophic pancreas without mass Spleen: Normal appearance Adrenals/Urinary Tract: Adrenal glands normal appearance. Peripelvic cyst central LEFT kidney again seen 2.6 x 2.8 cm. Tiny hyperdense nodule lower LEFT kidney laterally, corresponding to a small cyst seen on the previous exam, decreased in size but increased in attenuation. Bladder decompressed. Stomach/Bowel: Displacement of bowel loops by ascites. Sigmoid diverticulosis without evidence of diverticulitis. Stomach and remaining bowel loops unremarkable. Vascular/Lymphatic: Atherosclerotic calcifications aorta and iliac arteries without aneurysm. No adenopathy. Perisplenic collaterals. Reproductive: Uterus  surgically absent. Nodular soft tissue foci in the pelvis bilaterally likely represent the ovaries as noted on prior exam. Other: Marked ascites. Diffuse soft tissue edema. No free air. No hernia. Musculoskeletal: Osseous demineralization. Degenerative changes LEFT hip joint. IMPRESSION: Marked ascites. Small liver consistent with known cirrhosis with note of perisplenic collaterals. Sigmoid diverticulosis without evidence of diverticulitis. Aortic Atherosclerosis (ICD10-I70.0). Electronically Signed   By: Lavonia Dana M.D.   On: 01/12/2021 13:23   US Paracentesis  Result Date: 01/12/2021 INDICATION: Autoimmune hepatitis.  Ascites. EXAM: ULTRASOUND GUIDED  PARACENTESIS MEDICATIONS: None. COMPLICATIONS: None immediate. PROCEDURE: Informed written consent was obtained from the patient after a discussion of the risks, benefits and alternatives to treatment. A timeout was performed prior to the initiation of the procedure. Initial ultrasound scanning demonstrates a large amount of ascites within the right lower abdominal quadrant. The right lower abdomen was prepped and draped in the usual sterile fashion. 1% tetracaine was used for local anesthesia. Following this, a Yueh catheter was introduced. An ultrasound image was saved for documentation purposes. The paracentesis was performed. The catheter was removed and a dressing was applied. The patient tolerated the procedure well without immediate post procedural complication. Patient received post-procedure intravenous albumin; see nursing notes for details. Procedure was performed by Rowe Robert, PA-C. FINDINGS: A total of approximately 3.5 L of yellow fluid was removed. Samples were sent to the laboratory as requested by the clinical team. IMPRESSION: Successful ultrasound-guided paracentesis yielding 3.5 liters of peritoneal fluid. Electronically Signed   By: Markus Daft M.D.   On: 01/12/2021 17:34  DG Chest Port 1 View  Result Date: 01/12/2021 CLINICAL  DATA:  Shortness of breath EXAM: PORTABLE CHEST 1 VIEW COMPARISON:  Chest radiograph dated August 31, 2019 FINDINGS: The heart size and mediastinal contours are within normal limits. Pacemaker leads terminating in the right atrium and right ventricle. Both lungs are clear. The visualized skeletal structures are unremarkable. IMPRESSION: No active disease. Electronically Signed   By: Keane Police D.O.   On: 01/12/2021 12:57    IMPRESSION/PLAN:  106) 75 year old female with autoimmune hepatitis with decompensated cirrhosis, ascites, esophageal varices and pancytopenia admitted to the hospital 01/12/2021 with generalized abdominal pain, rectal bleeding and melena x 1 day. Started on PPI infusion and Octreotide infusion. Admission Hg 10.1 (baseline Hg 9 - 10.07) -> today Hg 7.8. CTAP wo contrast showed cirrhosis with marked ascites. S/P paracentesis, 3.5 L of peritoneal fluid removed.  No SBP. BP 83- 90's/50's. No active GI bleeding since admission.  No overt hepatic encephalopathy.  Off Prograf since 11/06/2020.  LFTs stable.  Last IgG level was 3,077 on 12/15.  -NPO -EGD and flex sig this afternoon with Dr. Lorenso Courier -Water enema x 1 in a.m. -Continue Cipro 412m IV bid for prophylaxis in the setting of GI bleeding  -Continue Octreotide infusion -Continue Pantoprazole infusion -Monitory H/H closely -Repeat BP now  -Transfuse for Hg level < 7.0 -Pain management per the hospitalist -She will require outpatient follow up with Dr. BTarri Glennregarding management of autoimmune hepatitis  2) Pancytopenia. WBC 1.5. Hg 7.8. PLT 37. CTAP without evidence of splenomegaly.  -Transfuse 1 pack of platelets today prior to EGD and flex sigmoidoscopy   3) Coagulopathy secondary to cirrhosis. INR 2.4 -> 3.4.  -Daily INR  4) GERD, history of gastric ulcers -See plan in # 1  5) History of adenomatous and tubulovillous colon polyps -Next surveillance colonoscopy due 02/2018  6) History of a stable pancreatic tail  cyst  7) CKD    CNoralyn Pick 01/13/2021, 10:19AM

## 2021-01-13 NOTE — Plan of Care (Signed)

## 2021-01-13 NOTE — Op Note (Signed)
[facility], [place] Patient Name: Stacey Velazquez Procedure Date: 01/13/2021 MRN: [id number] Attending MD: Georgian Co ,  Date of Birth: 13-Feb-1945 CSN: [id number] Age: 75 Admit Type: Inpatient Procedure:                Upper GI endoscopy Indications:              Hematochezia, Melena Providers:                Adline Mango" Lady Deutscher, RN, Lodema Hong Technician, Technician Referring MD:             Hospitalist team Medicines:                Monitored Anesthesia Care Complications:            No immediate complications. Estimated Blood Loss:     Estimated blood loss was minimal. Procedure:                Pre-Anesthesia Assessment:                           - Prior to the procedure, a History and Physical                            was performed, and patient medications and                            allergies were reviewed. The patient's tolerance of                            previous anesthesia was also reviewed. The risks                            and benefits of the procedure and the sedation                            options and risks were discussed with the patient.                            All questions were answered, and informed consent                            was obtained. Prior Anticoagulants: The patient has                            taken no previous anticoagulant or antiplatelet                            agents. ASA Grade Assessment: III - A patient with                            severe systemic disease. After reviewing the risks  and benefits, the patient was deemed in                            satisfactory condition to undergo the procedure.                           After obtaining informed consent, the endoscope was                            passed under direct vision. Throughout the                            procedure, the patient's blood pressure, pulse, and                             oxygen saturations were monitored continuously. The                            GIF-H190 ([id number]) Olympus endoscope was introduced                            through the mouth, and advanced to the second part                            of duodenum. The upper GI endoscopy was                            accomplished without difficulty. The patient                            tolerated the procedure well. Scope In: Scope Out: Findings:      Grade II varices were found in the lower third of the esophagus.      Portal hypertensive gastropathy was found in the gastric fundus and in       the gastric body.      Localized inflammation characterized by congestion (edema) and erythema       was found in the gastric antrum. Biopsies were taken with a cold forceps       for Helicobacter pylori testing.      The examined duodenum was normal. Impression:               - Grade II esophageal varices.                           - Portal hypertensive gastropathy.                           - Gastritis. Biopsied.                           - Normal examined duodenum. Moderate Sedation:      Not Applicable - Patient had care per Anesthesia. Recommendation:           - Await pathology results.                           -  Perform a colonoscopy today. Procedure Code(s):        --- Professional ---                           [phone], Esophagogastroduodenoscopy, flexible,                            transoral; with biopsy, single or multiple Diagnosis Code(s):        --- Professional ---                           I85.00, Esophageal varices without bleeding                           K76.6, Portal hypertension                           K31.89, Other diseases of stomach and duodenum                           K29.70, Gastritis, unspecified, without bleeding                           K92.1, Melena (includes Hematochezia) CPT copyright 2019 American Medical Association. All rights reserved. The codes  documented in this report are preliminary and upon coder review may  be revised to meet current compliance requirements. [address]Christia Reading,  01/13/2021 2:34:21 PM Number of Addenda: 0

## 2021-01-13 NOTE — Progress Notes (Signed)
Notified on call provider during the night about having issues with the cardiac monitor picking up for the patient. Patient does have a pacemaker; however, cardiology is not following. Patient did have a urinary incontinent episode during the night, but had not urinated with the purewick in place. Bladder scanned the patient and the patient had 501 mL. Not sure how accurate with the ascites, but notified the provider. Provider ordered in an out cath.

## 2021-01-13 NOTE — Anesthesia Preprocedure Evaluation (Addendum)
Anesthesia Evaluation  Patient identified by MRN, date of birth, ID band Patient awake    Reviewed: Allergy & Precautions, NPO status , Patient's Chart, lab work & pertinent test results  History of Anesthesia Complications Negative for: history of anesthetic complications  Airway Mallampati: II  TM Distance: >3 FB Neck ROM: Full    Dental  (+) Edentulous Lower, Edentulous Upper   Pulmonary former smoker,    Pulmonary exam normal        Cardiovascular + Past MI  Normal cardiovascular exam+ pacemaker      Neuro/Psych  Headaches, Seizures -,  Anxiety Bipolar Disorder CVA    GI/Hepatic PUD, GERD  ,(+) Cirrhosis  (s/p 3.5L paracentesis yesterday)  Esophageal Varices and ascites    , Hepatitis -, AutoimmuneGIB   Endo/Other  Hypothyroidism   Renal/GU Renal InsufficiencyRenal disease  negative genitourinary   Musculoskeletal  (+) Arthritis , Fibromyalgia -  Abdominal   Peds  Hematology  (+) Blood dyscrasia, anemia , INR 3.4, Plt 37, Hgb 7.2   Anesthesia Other Findings Day of surgery medications reviewed with patient.  Reproductive/Obstetrics negative OB ROS                            Anesthesia Physical Anesthesia Plan  ASA: 4  Anesthesia Plan: MAC   Post-op Pain Management: Minimal or no pain anticipated   Induction:   PONV Risk Score and Plan: 2 and Propofol infusion and Treatment may vary due to age or medical condition  Airway Management Planned: Natural Airway and Nasal Cannula  Additional Equipment: None  Intra-op Plan:   Post-operative Plan:   Informed Consent: I have reviewed the patients History and Physical, chart, labs and discussed the procedure including the risks, benefits and alternatives for the proposed anesthesia with the patient or authorized representative who has indicated his/her understanding and acceptance.       Plan Discussed with:  CRNA  Anesthesia Plan Comments:        Anesthesia Quick Evaluation

## 2021-01-13 NOTE — Telephone Encounter (Signed)
Appears pt did proceed to the ED and has been admitted.

## 2021-01-13 NOTE — Progress Notes (Addendum)
PROGRESS NOTE  Stacey Velazquez LKT:[id number] DOB: 05/28/1945 DOA: 01/12/2021 PCP: Libby Maw, MD  HPI/Recap of past 24 hours: Stacey Velazquez is a 75 y.o. female with medical history significant of autoimmune hepatitis,  with abdominal ascites, anemia of chronic disease, colon polyps, anxiety, bilateral lower extremity edema, bipolar disorder with insomnia, cutaneous horn, drug-induced parkinsonism, esophageal varices, fibromyalgia, gastric ulcer, GERD, gingival abscess, glaucoma, CAD, history of MI, hypothyroidism, migraine headaches, neutropenia, osteoporosis, paranoid schizophrenia, polyarthritis, rectal mass, seizure disorder, sinus arrest, sick sinus syndrome, pacemaker placement, history of nonhemorrhagic stroke, thrombocytopenia who presented to [facility] ED with progressively worsening abdominal distention for the past month associated with worsening dyspnea, lower extremity edema and abdominal pain that excalated last night.  Associated with few days of melena and hematochezia.  No nausea or vomiting.    01/13/21: Patient was seen and examined at bedside.  She reports she hurts all over.  She had a paracentesis done on 01/12/2021 with 3.5 L of fluid removed by IR.  Abdominal fluid not suggestive of SBP.  Assessment/Plan: Principal Problem:   Abdominal ascites Active Problems:   Leukopenia   Hypothyroidism   Autoimmune hepatitis (HCC)   Esophageal varices determined by endoscopy ([facility])   Hyponatremia   Chronic blood loss anemia  Autoimmune hepatitis with decompensated cirrhosis, ascites, esophageal varices and pancytopenia Post paracentesis on 01/12/2021 with 3.5 L of fluid removed, Abdominal fluid not suggestive of SBP. Maintain MAP greater than 65 Received IV fluid, judicious fluid. Started albumin 50 g every 6 hours x4 doses. Continue IV ciprofloxacin 400 mg twice daily for prophylaxis in the setting of GI bleed as recommended by GI. Continue octreotide infusion Continue  Protonix infusion. Monitor H&H Defer to GI to initiate diuretics, may require midodrine for blood pressure support.  Acute blood loss anemia in the setting of GI bleed, unspecified Continue IV ciprofloxacin for prophylaxis in the setting of decompensated cirrhosis with esophageal varices Continue IV PPI twice daily as recommended by GI. Transfuse hemoglobin less than 7.0.  Avoid over transfusion.  Recurrent acute urinary retention Reported urinary retention at the beginning of shift with more than 500 cc of urine retained.  Had In-N-Out cath. Repeated bladder scan after in and out cath with more than 600 cc of urine retained. Foley catheter placed on 01/13/2021 Consider urology follow-up in the clinic for voiding trial at discharge. Resume home tamsulosin Continue to closely monitor and treat as indicated.  Pancytopenia likely secondary to advanced liver disease Transfuse as indicated Repeat CBC Continue to monitor  Hypothyroidism Resume home levothyroxine  Generalized weakness/physical debility PT OT to assess Fall precautions   Code Status: Full code  Family Communication: None at bedside  Disposition Plan: Likely will discharge to home with home health services.   Consultants: GI  Procedures: EGD on 01/13/2021.  Antimicrobials: IV ciprofloxacin.  DVT prophylaxis: SCDs  Status is: Observation         Objective: Vitals:   01/13/21 0500 01/13/21 1015 01/13/21 1016 01/13/21 1046  BP: (!) 83/49  (!) 89/60   Pulse: 75 73 73 73  Resp: 20 (!) 24 16 (!) 24  Temp: 97.7 F (36.5 C) 98.1 F (36.7 C) 98.1 F (36.7 C) 97.7 F (36.5 C)  TempSrc: Oral Oral Oral Oral  SpO2: 100% 100% 100% 100%  Weight:      Height:        Intake/Output Summary (Last 24 hours) at 01/13/2021 1153 Last data filed at 01/13/2021 0600 Gross per 24 hour  Intake 1561.22 ml  Output --  Net 1561.22 ml   Filed Weights   01/12/21 1147  Weight: 80.3 kg    Exam:  General:  75 y.o. year-old female well developed well nourished in no acute distress.  Alert and oriented x3.  Appears uncomfortable due to diffuse pain. Cardiovascular: Regular rate and rhythm with no rubs or gallops.  No thyromegaly or JVD noted.   Respiratory: Clear to auscultation with no wheezes or rales. Good inspiratory effort. Abdomen: Moderately distended with normal bowel sounds x4 quadrants. Musculoskeletal: 2+ pitting edema lower extremities bilaterally.  Skin: No ulcerative lesions noted or rashes, Psychiatry: Mood is appropriate for condition and setting   Data Reviewed: CBC: Recent Labs  Lab 01/08/21 1012 01/12/21 1153 01/13/21 0423  WBC 2.3* 2.5* 1.5*  NEUTROABS  --  1.7  --   HGB 10.7* 10.1* 7.8*  HCT 31.4* 29.6* 23.9*  MCV 90.8 91.1 94.1  PLT 26.0 Repeated and verified X2.* 51* 37*   Basic Metabolic Panel: Recent Labs  Lab 01/08/21 1012 01/12/21 1153 01/13/21 0423  NA 133* 131* 132*  K 4.0 4.1 4.2  CL 104 106 106  CO2 22 22 20*  GLUCOSE 82 99 94  BUN 15 18 16   CREATININE 1.18 1.22* 1.22*  CALCIUM 8.0* 7.8* 7.7*   GFR: Estimated Creatinine Clearance: 44.5 mL/min (A) (by C-G formula based on SCr of 1.22 mg/dL (H)). Liver Function Tests: Recent Labs  Lab 01/08/21 1012 01/12/21 1153 01/13/21 0423  AST 49*   49* 56* 40  ALT 29   29 37 24  ALKPHOS 110   110 105 72  BILITOT 1.2   1.2 1.6* 1.6*  PROT 6.3   6.3 6.4* 4.6*  ALBUMIN 2.2*   2.2* 2.1* 2.1*   Recent Labs  Lab 01/12/21 1153  LIPASE 29   Recent Labs  Lab 01/12/21 1208  AMMONIA 38*   Coagulation Profile: Recent Labs  Lab 01/12/21 1256 01/13/21 0423  INR 2.4* 3.4*   Cardiac Enzymes: No results for input(s): CKTOTAL, CKMB, CKMBINDEX, TROPONINI in the last 168 hours. BNP (last 3 results) No results for input(s): PROBNP in the last 8760 hours. HbA1C: No results for input(s): HGBA1C in the last 72 hours. CBG: No results for input(s): GLUCAP in the last 168 hours. Lipid Profile: No  results for input(s): CHOL, HDL, LDLCALC, TRIG, CHOLHDL, LDLDIRECT in the last 72 hours. Thyroid Function Tests: No results for input(s): TSH, T4TOTAL, FREET4, T3FREE, THYROIDAB in the last 72 hours. Anemia Panel: No results for input(s): VITAMINB12, FOLATE, FERRITIN, TIBC, IRON, RETICCTPCT in the last 72 hours. Urine analysis:    Component Value Date/Time   COLORURINE AMBER (A) 01/12/2021 1509   APPEARANCEUR HAZY (A) 01/12/2021 1509   LABSPEC 1.019 01/12/2021 1509   PHURINE 5.0 01/12/2021 1509   GLUCOSEU NEGATIVE 01/12/2021 1509   GLUCOSEU NEGATIVE 11/14/2020 1041   HGBUR SMALL (A) 01/12/2021 1509   BILIRUBINUR NEGATIVE 01/12/2021 1509   KETONESUR NEGATIVE 01/12/2021 1509   PROTEINUR NEGATIVE 01/12/2021 1509   UROBILINOGEN 0.2 11/14/2020 1041   NITRITE NEGATIVE 01/12/2021 1509   LEUKOCYTESUR NEGATIVE 01/12/2021 1509   Sepsis Labs: @LABRCNTIP (procalcitonin:4,lacticidven:4)  ) Recent Results (from the past 240 hour(s))  Resp Panel by RT-PCR (Flu A&B, Covid) Nasopharyngeal Swab     Status: None   Collection Time: 01/12/21 12:00 PM   Specimen: Nasopharyngeal Swab; Nasopharyngeal(NP) swabs in vial transport medium  Result Value Ref Range Status   SARS Coronavirus 2 by RT PCR NEGATIVE NEGATIVE Final  Comment: (NOTE) SARS-CoV-2 target nucleic acids are NOT DETECTED.  The SARS-CoV-2 RNA is generally detectable in upper respiratory specimens during the acute phase of infection. The lowest concentration of SARS-CoV-2 viral copies this assay can detect is 138 copies/mL. A negative result does not preclude SARS-Cov-2 infection and should not be used as the sole basis for treatment or other patient management decisions. A negative result may occur with  improper specimen collection/handling, submission of specimen other than nasopharyngeal swab, presence of viral mutation(s) within the areas targeted by this assay, and inadequate number of viral copies(<138 copies/mL). A negative  result must be combined with clinical observations, patient history, and epidemiological information. The expected result is Negative.  Fact Sheet for Patients:  EntrepreneurPulse.com.au  Fact Sheet for Healthcare Providers:  IncredibleEmployment.be  This test is no t yet approved or cleared by the [place] FDA and  has been authorized for detection and/or diagnosis of SARS-CoV-2 by FDA under an Emergency Use Authorization (EUA). This EUA will remain  in effect (meaning this test can be used) for the duration of the COVID-19 declaration under Section 564(b)(1) of the Act, 21 U.S.C.section 360bbb-3(b)(1), unless the authorization is terminated  or revoked sooner.       Influenza A by PCR NEGATIVE NEGATIVE Final   Influenza B by PCR NEGATIVE NEGATIVE Final    Comment: (NOTE) The Xpert Xpress SARS-CoV-2/FLU/RSV plus assay is intended as an aid in the diagnosis of influenza from Nasopharyngeal swab specimens and should not be used as a sole basis for treatment. Nasal washings and aspirates are unacceptable for Xpert Xpress SARS-CoV-2/FLU/RSV testing.  Fact Sheet for Patients: EntrepreneurPulse.com.au  Fact Sheet for Healthcare Providers: IncredibleEmployment.be  This test is not yet approved or cleared by the [place] FDA and has been authorized for detection and/or diagnosis of SARS-CoV-2 by FDA under an Emergency Use Authorization (EUA). This EUA will remain in effect (meaning this test can be used) for the duration of the COVID-19 declaration under Section 564(b)(1) of the Act, 21 U.S.C. section 360bbb-3(b)(1), unless the authorization is terminated or revoked.  Performed at [facility], [place] [address] [place], [place] [address]   Body fluid culture w Gram Stain     Status: None (Preliminary result)   Collection Time: 01/12/21  5:30 PM   Specimen: PATH Cytology  Peritoneal fluid  Result Value Ref Range Status   Specimen Description   Final    PERITONEAL Performed at [place] [address], [place], [place] [address]    Special Requests   Final    NONE Performed at [facility], [place] [address], [place], [place] [address]    Gram Stain   Final    NO SQUAMOUS EPITHELIAL CELLS SEEN NO WBC SEEN NO ORGANISMS SEEN    Culture   Final    NO GROWTH < 12 HOURS Performed at [facility], [place] [address] [place], [place] [address]    Report Status PENDING  Incomplete  MRSA Next Gen by PCR, Nasal     Status: None   Collection Time: 01/13/21  5:20 AM   Specimen: Nasal Mucosa; Nasal Swab  Result Value Ref Range Status   MRSA by PCR Next Gen NOT DETECTED NOT DETECTED Final    Comment: (NOTE) The GeneXpert MRSA Assay (FDA approved for NASAL specimens only), is one component of a comprehensive MRSA colonization surveillance program. It is not intended to diagnose MRSA infection nor to guide or monitor treatment  for MRSA infections. Test performance is not FDA approved in patients less than 42 years old. Performed at [facility], [place] [address], [place], [place] [address]       Studies: CT Abdomen Pelvis Wo Contrast  Result Date: 01/12/2021 CLINICAL DATA:  Abdominal pain and dyspnea since last night, dark stools, blood in stool, abdominal distension for 1 month; history cirrhosis, drug-induced parkinsonism, GERD, stage IV chronic kidney disease EXAM: CT ABDOMEN AND PELVIS WITHOUT CONTRAST TECHNIQUE: Multidetector CT imaging of the abdomen and pelvis was performed following the standard protocol without IV contrast. COMPARISON:  11/14/2019 FINDINGS: Lower chest: Lung bases clear.  RIGHT ventricular pacemaker lead. Hepatobiliary: Liver small without focal mass. Post cholecystectomy. Pancreas: Atrophic pancreas without mass Spleen: Normal appearance Adrenals/Urinary Tract: Adrenal glands  normal appearance. Peripelvic cyst central LEFT kidney again seen 2.6 x 2.8 cm. Tiny hyperdense nodule lower LEFT kidney laterally, corresponding to a small cyst seen on the previous exam, decreased in size but increased in attenuation. Bladder decompressed. Stomach/Bowel: Displacement of bowel loops by ascites. Sigmoid diverticulosis without evidence of diverticulitis. Stomach and remaining bowel loops unremarkable. Vascular/Lymphatic: Atherosclerotic calcifications aorta and iliac arteries without aneurysm. No adenopathy. Perisplenic collaterals. Reproductive: Uterus surgically absent. Nodular soft tissue foci in the pelvis bilaterally likely represent the ovaries as noted on prior exam. Other: Marked ascites. Diffuse soft tissue edema. No free air. No hernia. Musculoskeletal: Osseous demineralization. Degenerative changes LEFT hip joint. IMPRESSION: Marked ascites. Small liver consistent with known cirrhosis with note of perisplenic collaterals. Sigmoid diverticulosis without evidence of diverticulitis. Aortic Atherosclerosis (ICD10-I70.0). Electronically Signed   By: Lavonia Dana M.D.   On: 01/12/2021 13:23   US Paracentesis  Result Date: 01/12/2021 INDICATION: Autoimmune hepatitis.  Ascites. EXAM: ULTRASOUND GUIDED  PARACENTESIS MEDICATIONS: None. COMPLICATIONS: None immediate. PROCEDURE: Informed written consent was obtained from the patient after a discussion of the risks, benefits and alternatives to treatment. A timeout was performed prior to the initiation of the procedure. Initial ultrasound scanning demonstrates a large amount of ascites within the right lower abdominal quadrant. The right lower abdomen was prepped and draped in the usual sterile fashion. 1% tetracaine was used for local anesthesia. Following this, a Yueh catheter was introduced. An ultrasound image was saved for documentation purposes. The paracentesis was performed. The catheter was removed and a dressing was applied. The patient  tolerated the procedure well without immediate post procedural complication. Patient received post-procedure intravenous albumin; see nursing notes for details. Procedure was performed by Rowe Robert, PA-C. FINDINGS: A total of approximately 3.5 L of yellow fluid was removed. Samples were sent to the laboratory as requested by the clinical team. IMPRESSION: Successful ultrasound-guided paracentesis yielding 3.5 liters of peritoneal fluid. Electronically Signed   By: Markus Daft M.D.   On: 01/12/2021 17:34   DG Chest Port 1 View  Result Date: 01/12/2021 CLINICAL DATA:  Shortness of breath EXAM: PORTABLE CHEST 1 VIEW COMPARISON:  Chest radiograph dated August 31, 2019 FINDINGS: The heart size and mediastinal contours are within normal limits. Pacemaker leads terminating in the right atrium and right ventricle. Both lungs are clear. The visualized skeletal structures are unremarkable. IMPRESSION: No active disease. Electronically Signed   By: Keane Police D.O.   On: 01/12/2021 12:57    Scheduled Meds:  fentaNYL (SUBLIMAZE) injection  50 mcg Intravenous Once   mouth rinse  15 mL Mouth Rinse BID   [START ON 01/16/2021] pantoprazole  40 mg Intravenous Q12H    Continuous Infusions:  albumin human  ciprofloxacin 400 mg (01/13/21 0924)   octreotide  (SANDOSTATIN)    IV infusion 50 mcg/hr (01/13/21 0218)   pantoprazole 8 mg/hr (01/13/21 0220)     LOS: 0 days     Kayleen Memos, MD [facility] Pager [phone]  If 7PM-7AM, please contact night-coverage [email or web address] Password [facility] 01/13/2021, 11:53 AM

## 2021-01-13 NOTE — Progress Notes (Signed)
Tap water enema administered as per order, clear brown liquid returned only.

## 2021-01-13 NOTE — Progress Notes (Signed)
Notified on call provider about patient's low blood pressures. On call provider put in an order for 250 mL bolus.

## 2021-01-13 NOTE — Interval H&P Note (Signed)
History and Physical Interval Note:  01/13/2021 1:43 PM  Stacey Velazquez  has presented today for surgery, with the diagnosis of Melena/Hematochezia/History of EVs/History of Rectal polyp s/p Transanal excision.  The various methods of treatment have been discussed with the patient and family. After consideration of risks, benefits and other options for treatment, the patient has consented to  Procedure(s): ESOPHAGOGASTRODUODENOSCOPY (EGD) (N/A) FLEXIBLE SIGMOIDOSCOPY (N/A) as a surgical intervention.  The patient's history has been reviewed, patient examined, no change in status, stable for surgery.  I have reviewed the patient's chart and labs.  Questions were answered to the patient's satisfaction.     Sharyn Creamer

## 2021-01-13 NOTE — Care Plan (Signed)
Endo procedure completed see report in chart MD into talk and review with patient RN phone report Patient aware she will be returning to room - follows commands AVSS at this time - see epic for full details

## 2021-01-13 NOTE — Transfer of Care (Signed)
Immediate Anesthesia Transfer of Care Note  Patient: Stacey Velazquez  Procedure(s) Performed: ESOPHAGOGASTRODUODENOSCOPY (EGD) FLEXIBLE SIGMOIDOSCOPY BIOPSY  Patient Location: PACU and Endoscopy Unit  Anesthesia Type:MAC  Level of Consciousness: awake, drowsy and patient cooperative  Airway & Oxygen Therapy: Patient Spontanous Breathing and Patient connected to face mask oxygen  Post-op Assessment: Report given to RN and Post -op Vital signs reviewed and stable  Post vital signs: Reviewed and stable  Last Vitals:  Vitals Value Taken Time  BP 97/57 01/13/21 1439  Temp    Pulse 69 01/13/21 1439  Resp 11 01/13/21 1439  SpO2 100 % 01/13/21 1439    Last Pain:  Vitals:   01/13/21 1330  TempSrc: Oral  PainSc: 10-Worst pain ever      Patients Stated Pain Goal: Other (Comment) (02/63/78 5885)  Complications: No notable events documented.

## 2021-01-14 ENCOUNTER — Encounter (HOSPITAL_COMMUNITY): Payer: Self-pay | Admitting: Internal Medicine

## 2021-01-14 ENCOUNTER — Inpatient Hospital Stay (HOSPITAL_COMMUNITY): Payer: Medicare Other

## 2021-01-14 DIAGNOSIS — R140 Abdominal distension (gaseous): Secondary | ICD-10-CM | POA: Diagnosis not present

## 2021-01-14 DIAGNOSIS — D6481 Anemia due to antineoplastic chemotherapy: Secondary | ICD-10-CM | POA: Diagnosis present

## 2021-01-14 DIAGNOSIS — K754 Autoimmune hepatitis: Secondary | ICD-10-CM | POA: Diagnosis not present

## 2021-01-14 DIAGNOSIS — D500 Iron deficiency anemia secondary to blood loss (chronic): Secondary | ICD-10-CM | POA: Diagnosis not present

## 2021-01-14 DIAGNOSIS — R188 Other ascites: Secondary | ICD-10-CM | POA: Diagnosis not present

## 2021-01-14 LAB — CBC WITH DIFFERENTIAL/PLATELET
Abs Immature Granulocytes: 0 10*3/uL (ref 0.00–0.07)
Basophils Absolute: 0 10*3/uL (ref 0.0–0.1)
Basophils Relative: 1 %
Eosinophils Absolute: 0 10*3/uL (ref 0.0–0.5)
Eosinophils Relative: 1 %
HCT: 24.3 % — ABNORMAL LOW (ref 36.0–46.0)
Hemoglobin: 8.4 g/dL — ABNORMAL LOW (ref 12.0–15.0)
Immature Granulocytes: 0 %
Lymphocytes Relative: 33 %
Lymphs Abs: 0.4 10*3/uL — ABNORMAL LOW (ref 0.7–4.0)
MCH: 30.9 pg (ref 26.0–34.0)
MCHC: 34.6 g/dL (ref 30.0–36.0)
MCV: 89.3 fL (ref 80.0–100.0)
Monocytes Absolute: 0.2 10*3/uL (ref 0.1–1.0)
Monocytes Relative: 16 %
Neutro Abs: 0.6 10*3/uL — ABNORMAL LOW (ref 1.7–7.7)
Neutrophils Relative %: 49 %
Platelets: 53 10*3/uL — ABNORMAL LOW (ref 150–400)
RBC: 2.72 MIL/uL — ABNORMAL LOW (ref 3.87–5.11)
RDW: 14.6 % (ref 11.5–15.5)
WBC: 1.2 10*3/uL — CL (ref 4.0–10.5)
nRBC: 0 % (ref 0.0–0.2)

## 2021-01-14 LAB — CBC
HCT: 19.5 % — ABNORMAL LOW (ref 36.0–46.0)
Hemoglobin: 6.7 g/dL — CL (ref 12.0–15.0)
MCH: 31.3 pg (ref 26.0–34.0)
MCHC: 34.4 g/dL (ref 30.0–36.0)
MCV: 91.1 fL (ref 80.0–100.0)
Platelets: 50 10*3/uL — ABNORMAL LOW (ref 150–400)
RBC: 2.14 MIL/uL — ABNORMAL LOW (ref 3.87–5.11)
RDW: 14.3 % (ref 11.5–15.5)
WBC: 1 10*3/uL — CL (ref 4.0–10.5)
nRBC: 0 % (ref 0.0–0.2)

## 2021-01-14 LAB — IRON AND TIBC: Iron: 45 ug/dL (ref 28–170)

## 2021-01-14 LAB — BASIC METABOLIC PANEL
Anion gap: 6 (ref 5–15)
BUN: 18 mg/dL (ref 8–23)
CO2: 20 mmol/L — ABNORMAL LOW (ref 22–32)
Calcium: 7.9 mg/dL — ABNORMAL LOW (ref 8.9–10.3)
Chloride: 105 mmol/L (ref 98–111)
Creatinine, Ser: 1.31 mg/dL — ABNORMAL HIGH (ref 0.44–1.00)
GFR, Estimated: 42 mL/min — ABNORMAL LOW (ref >60)
Glucose, Bld: 94 mg/dL (ref 70–99)
Potassium: 3.8 mmol/L (ref 3.5–5.1)
Sodium: 131 mmol/L — ABNORMAL LOW (ref 135–145)

## 2021-01-14 LAB — DIFFERENTIAL
Abs Immature Granulocytes: 0.01 10*3/uL (ref 0.00–0.07)
Basophils Absolute: 0 10*3/uL (ref 0.0–0.1)
Basophils Relative: 1 %
Eosinophils Absolute: 0 10*3/uL (ref 0.0–0.5)
Eosinophils Relative: 3 %
Immature Granulocytes: 1 %
Lymphocytes Relative: 32 %
Lymphs Abs: 0.3 10*3/uL — ABNORMAL LOW (ref 0.7–4.0)
Monocytes Absolute: 0.2 10*3/uL (ref 0.1–1.0)
Monocytes Relative: 15 %
Neutro Abs: 0.5 10*3/uL — ABNORMAL LOW (ref 1.7–7.7)
Neutrophils Relative %: 48 %

## 2021-01-14 LAB — HEPATIC FUNCTION PANEL
ALT: 21 U/L (ref 0–44)
AST: 38 U/L (ref 15–41)
Albumin: 3.4 g/dL — ABNORMAL LOW (ref 3.5–5.0)
Alkaline Phosphatase: 52 U/L (ref 38–126)
Bilirubin, Direct: 0.6 mg/dL — ABNORMAL HIGH (ref 0.0–0.2)
Indirect Bilirubin: 1.2 mg/dL — ABNORMAL HIGH (ref 0.3–0.9)
Total Bilirubin: 1.8 mg/dL — ABNORMAL HIGH (ref 0.3–1.2)
Total Protein: 5.3 g/dL — ABNORMAL LOW (ref 6.5–8.1)

## 2021-01-14 LAB — PREPARE PLATELET PHERESIS: Unit division: 0

## 2021-01-14 LAB — RETICULOCYTES
Immature Retic Fract: 11.5 % (ref 2.3–15.9)
RBC.: 2.72 MIL/uL — ABNORMAL LOW (ref 3.87–5.11)
Retic Count, Absolute: 47.3 10*3/uL (ref 19.0–186.0)
Retic Ct Pct: 1.7 % (ref 0.4–3.1)

## 2021-01-14 LAB — VITAMIN B12: Vitamin B-12: 666 pg/mL (ref 180–914)

## 2021-01-14 LAB — LACTATE DEHYDROGENASE: LDH: 101 U/L (ref 98–192)

## 2021-01-14 LAB — BPAM PLATELET PHERESIS
Blood Product Expiration Date: 202212222359
ISSUE DATE / TIME: 202212201027
Unit Type and Rh: 6200

## 2021-01-14 LAB — PROTIME-INR
INR: 3.8 — ABNORMAL HIGH (ref 0.8–1.2)
Prothrombin Time: 37.8 seconds — ABNORMAL HIGH (ref 11.4–15.2)

## 2021-01-14 LAB — PATHOLOGIST SMEAR REVIEW

## 2021-01-14 LAB — FERRITIN: Ferritin: 168 ng/mL (ref 11–307)

## 2021-01-14 LAB — PHOSPHORUS: Phosphorus: 3.7 mg/dL (ref 2.5–4.6)

## 2021-01-14 LAB — DIRECT ANTIGLOBULIN TEST (NOT AT ARMC)
DAT, IgG: NEGATIVE
DAT, complement: NEGATIVE

## 2021-01-14 LAB — PREPARE RBC (CROSSMATCH)

## 2021-01-14 LAB — MAGNESIUM: Magnesium: 2 mg/dL (ref 1.7–2.4)

## 2021-01-14 IMAGING — US US HEPATIC LIVER DOPPLER
2 series · 15 of 25 positions shown · non-contrast
Comparison: CT [DATE] and previous

CLINICAL DATA: Ascites, rule out portal or hepatic venous
thrombosis

EXAM:
DUPLEX ULTRASOUND OF LIVER
TECHNIQUE: Color and duplex Doppler ultrasound was performed to evaluate the
hepatic in-flow and out-flow vessels.

[Series 1: us art/ven flow abd pelv doppl mc & wl · 14 of 93 slices shown (1 of 2)]
[im 1/93]
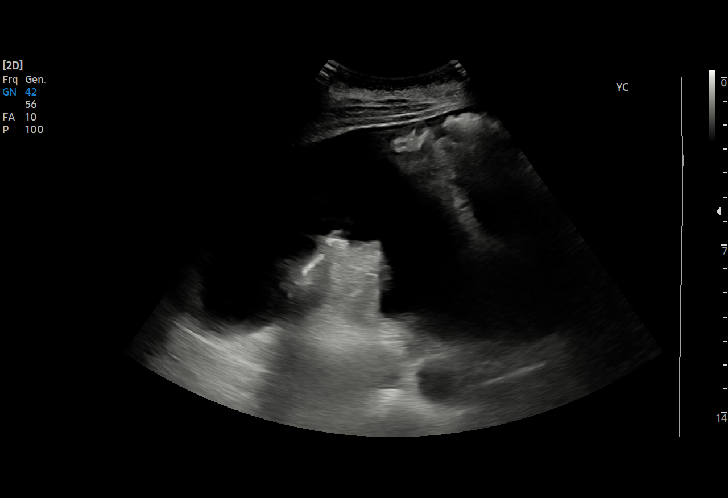
[im 9/93]
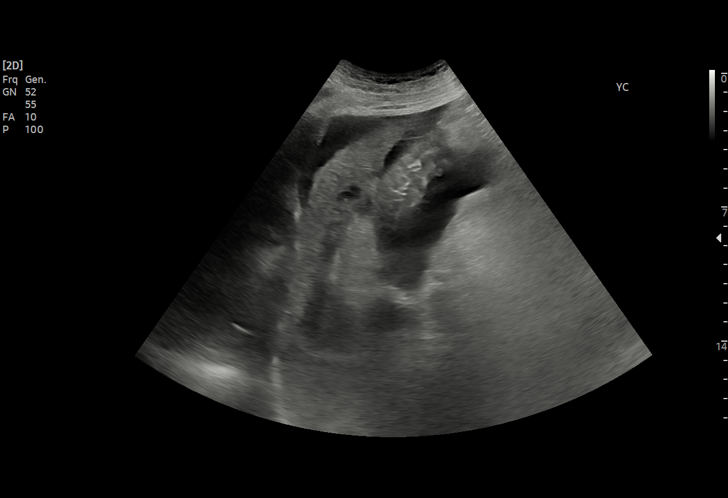
[im 17/93]
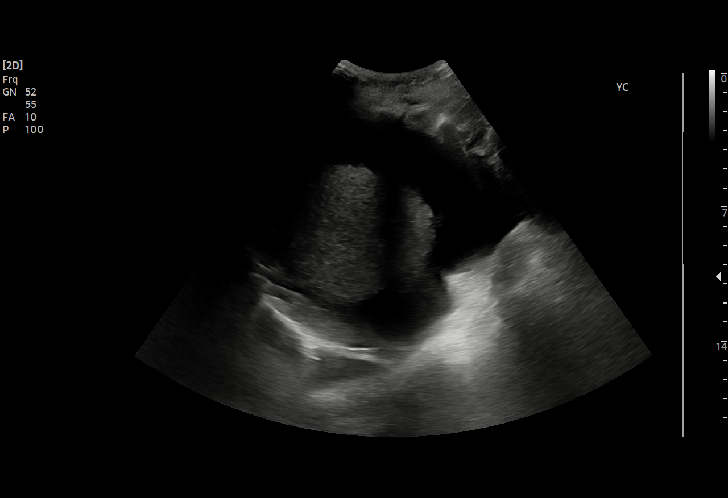
[im 21/93]
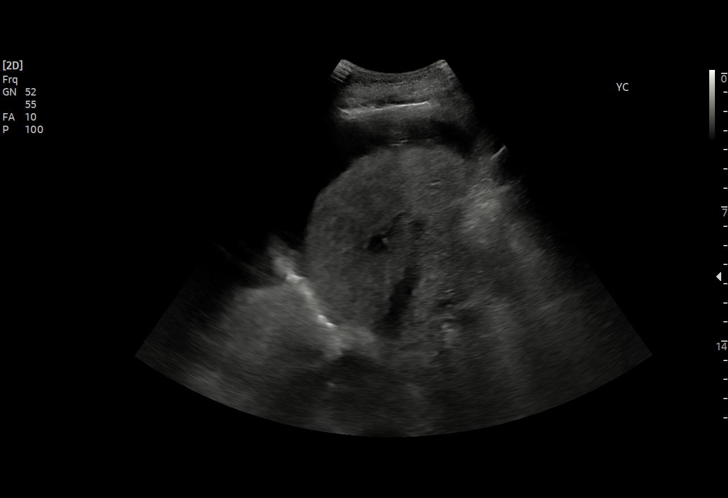
[im 29/93]
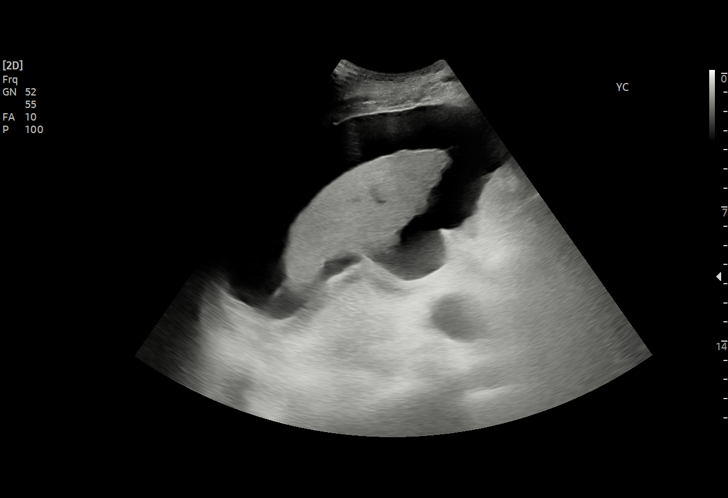
[im 37/93]
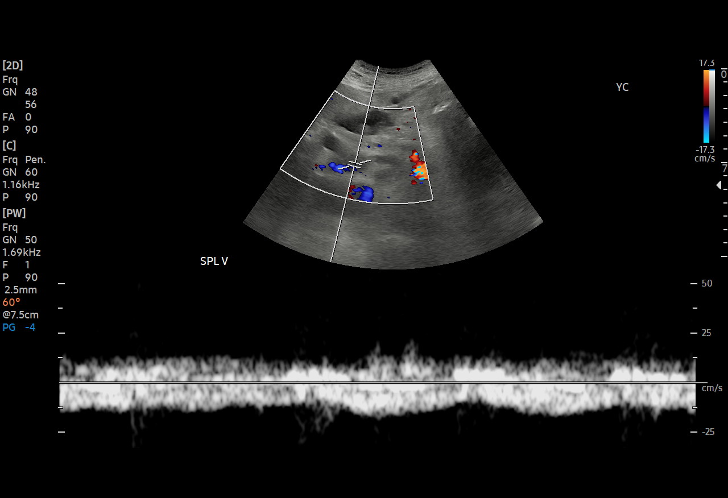
[im 41/93]
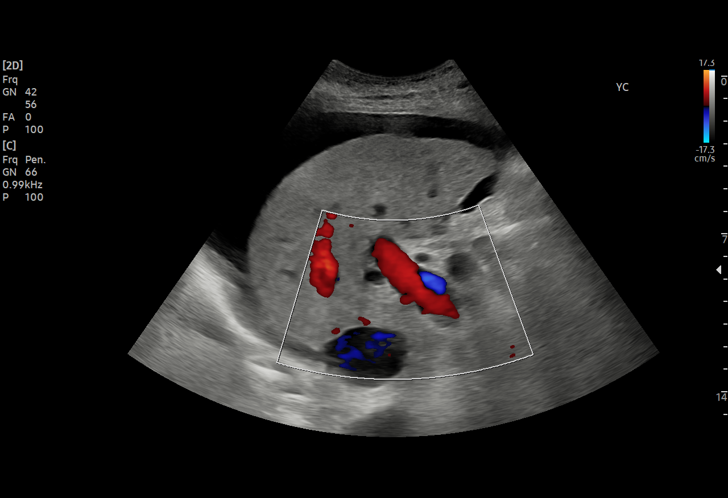
[im 49/93]
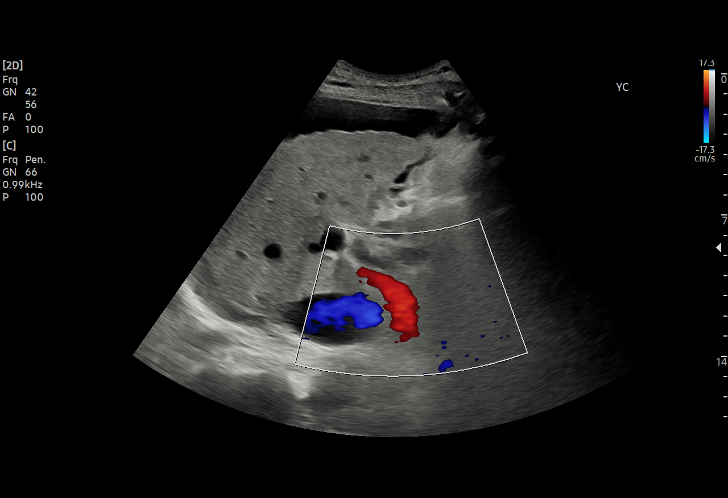
[im 57/93]
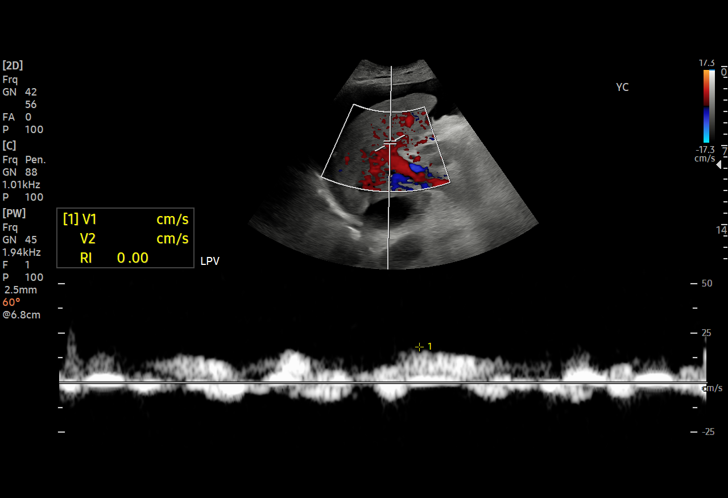
[im 61/93]
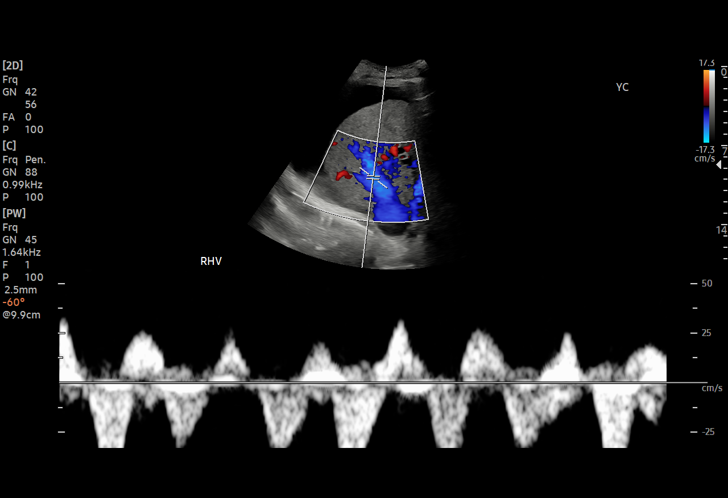
[im 69/93]
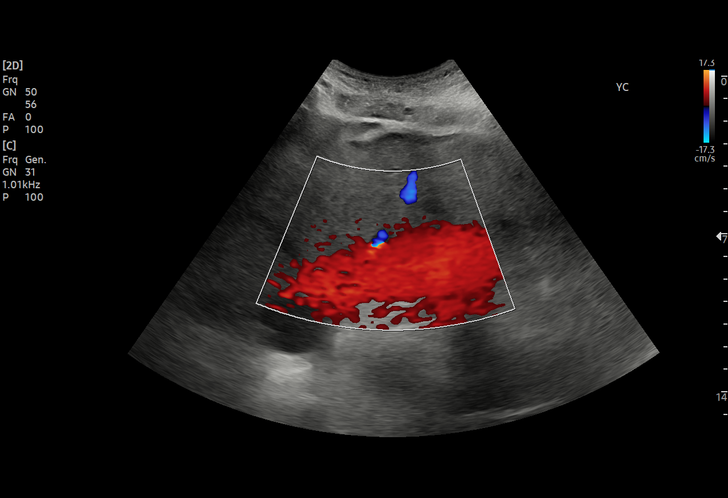
[im 77/93]
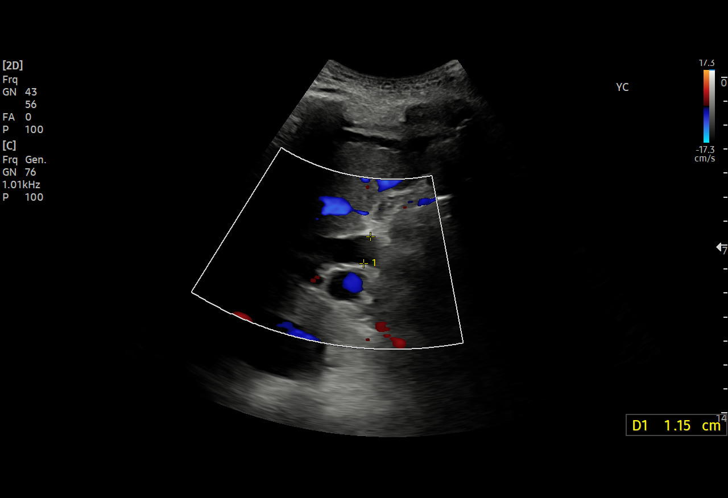
[im 81/93]
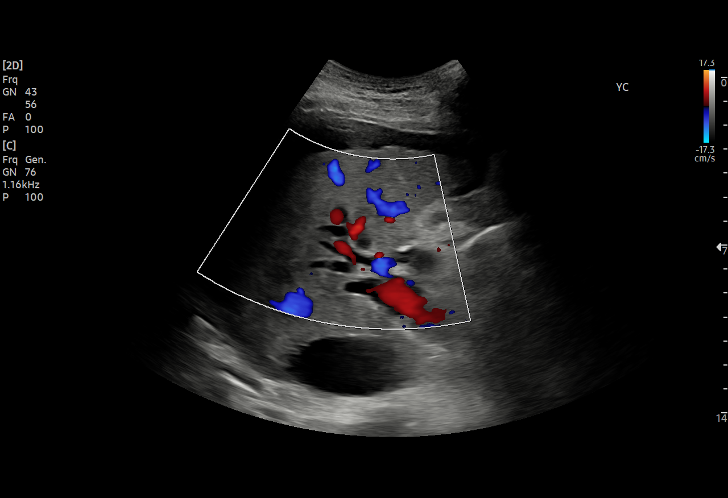
[im 89/93]
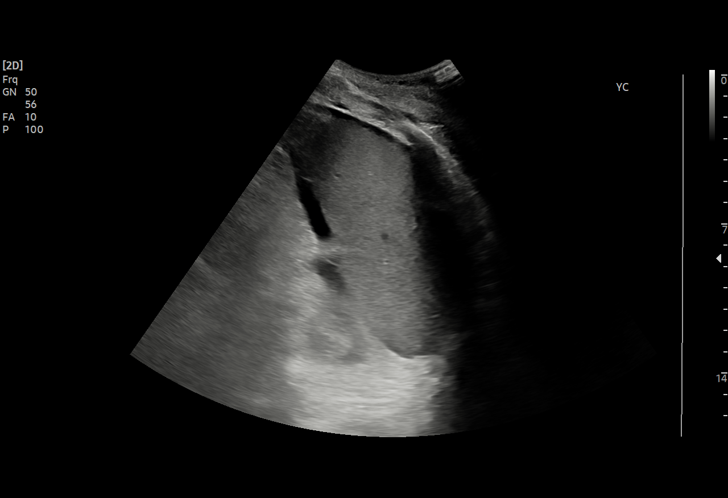

[Series 2: us art/ven flow abd pelv doppl mc & wl · 1 of 2 slices shown (2 of 2)]
[im 1/2]
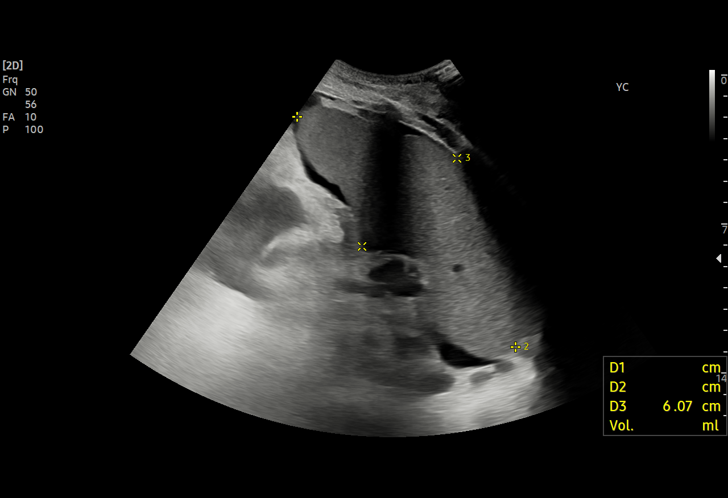

[15 of 25 positions shown; findings below may reference images not displayed]

FINDINGS: Liver: Nodular contour.

No focal lesion, mass or intrahepatic biliary ductal dilatation.

Main Portal Vein size: 1.4 cm

Portal Vein Velocities (all hepatopetal):

Main Prox:  36 cm/sec

Main Mid: 22 cm/sec

Main Dist:  25 cm/sec
Right: 15 cm/sec
Left: 18 cm/sec

Hepatic Vein Velocities (all hepatofugal):

Right:  56 cm/sec

Middle:  53 cm/sec

Left:  64 cm/sec

IVC: Present and patent with normal respiratory phasicity. Velocity
22 cm/sec

Hepatic Artery Velocity:  59 cm/sec

Splenic Vein Velocity:  20 cm/sec

Spleen: 13.3 cm x 6.1 cm x 14.9 cm with a total volume of 630 cm^3
(411 cm^3 is upper limit normal)

Portal Vein Occlusion/Thrombus: No

Splenic Vein Occlusion/Thrombus: No

Ascites: Present

Varices: None identified
IMPRESSION: 1. Negative for hepatic or portal venous thrombosis.
2. Nodular hepatic contour suggesting cirrhosis, without focal
lesion.
3. Splenomegaly and ascites suggesting portal venous hypertension.

## 2021-01-14 IMAGING — DX DG ABD PORTABLE 1V
1 series · 1 of 1 positions shown · non-contrast
Comparison: Radiographs [DATE].  CT [DATE].

CLINICAL DATA: Abdominal pain and distension. History of cirrhosis
with ascites.

EXAM:
PORTABLE ABDOMEN - 1 VIEW

[abdomen kub]
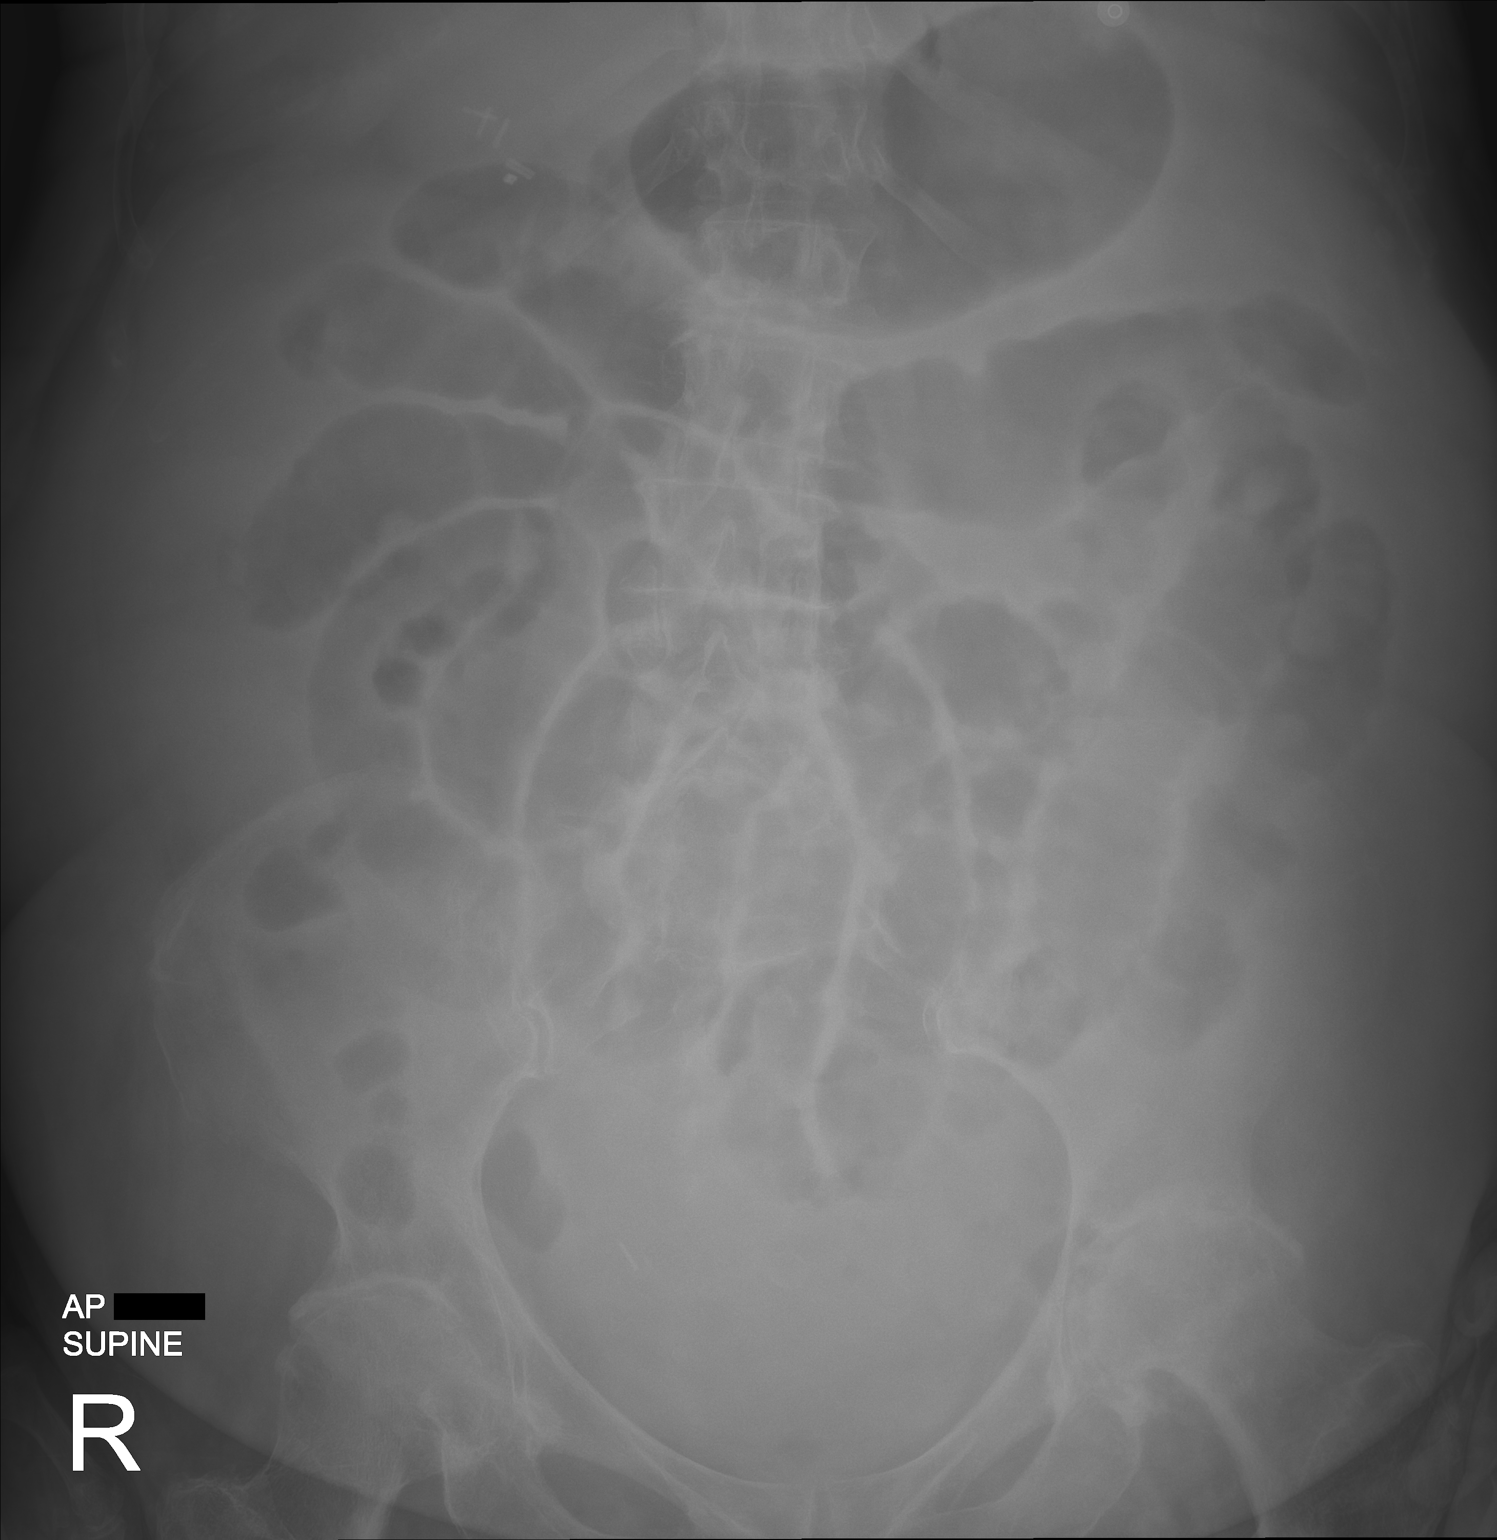

[1 of 1 positions shown; findings below may reference images not displayed]

FINDINGS: [8V] hours. The bowel gas pattern is nonobstructive. There is
central displacement of the bowel consistent with known underlying
ascites. Mild bowel wall thickening, similar to recent CT and likely
related to ascites/liver disease as well. No supine evidence of free
intraperitoneal air. Surgical clips are present related to prior
cholecystectomy with a stable surgical clip in the right pelvis.
Advanced asymmetric left hip osteoarthritis noted.
IMPRESSION: No evidence of acute abdominal process. Known findings related to
previously demonstrated ascites.

## 2021-01-14 MED ORDER — ENSURE ENLIVE PO LIQD
237.0000 mL | ORAL | Status: DC
  Administered 2021-01-15: 14:00:00 237 mL via ORAL

## 2021-01-14 MED ORDER — VITAMIN K1 10 MG/ML IJ SOLN
10.0000 mg | Freq: Every day | INTRAVENOUS | Status: AC
  Administered 2021-01-14 – 2021-01-16 (×3): 10 mg via INTRAVENOUS
  Filled 2021-01-14 (×3): qty 1

## 2021-01-14 MED ORDER — PROSOURCE PLUS PO LIQD
30.0000 mL | Freq: Two times a day (BID) | ORAL | Status: DC
  Administered 2021-01-14 – 2021-01-20 (×12): 30 mL via ORAL
  Filled 2021-01-14 (×13): qty 30

## 2021-01-14 MED ORDER — SODIUM CHLORIDE 0.9% IV SOLUTION: Freq: Once | INTRAVENOUS | Status: DC

## 2021-01-14 MED ORDER — ALBUMIN HUMAN 25 % IV SOLN
25.0000 g | Freq: Once | INTRAVENOUS | Status: AC
  Administered 2021-01-15: 14:00:00 25 g via INTRAVENOUS
  Filled 2021-01-14: qty 100

## 2021-01-14 MED ORDER — POLYETHYLENE GLYCOL 3350 17 G PO PACK
17.0000 g | PACK | Freq: Every day | ORAL | Status: DC
  Administered 2021-01-14 – 2021-01-22 (×9): 17 g via ORAL
  Filled 2021-01-14 (×9): qty 1

## 2021-01-14 MED ORDER — ALBUMIN HUMAN 25 % IV SOLN: 25.0000 g | Freq: Once | INTRAVENOUS | Status: DC

## 2021-01-14 NOTE — Progress Notes (Addendum)
[facility] Progress Note  CC:  Autoimmune hepatitis    Subjective:  She is eating breakfast, tolerating small bites of solid food. No N/V or worsening upper abdominal pain. She passed a brown stool last night as reported by her RN, no black stools or rectal  bleeding. No CP or SOB. No family at the bedside.   Objective:   Liver doppler 01/14/2021: 1. Negative for hepatic or portal venous thrombosis. 2. Nodular hepatic contour suggesting cirrhosis, without focal lesion. 3. Splenomegaly and ascites suggesting portal venous hypertension.    EGD 01/13/2021: - Grade II esophageal varices. - Portal hypertensive gastropathy. - Gastritis. Biopsied. - Normal examined duodenum.  Flex sig 01/13/2021:  - Stool in the rectum, in the sigmoid colon, in the descending colon and in the transverse colon. - Diverticulosis in the sigmoid colon, in the descending colon and in the transverse colon. - A tattoo was seen in the transverse colon. - Multiple, non-bleeding polyps in the sigmoid colon, in the descending colon and in the transverse colon. Not removed due to presentation for bleeding. - Non-bleeding internal hemorrhoids. - No specimens collected.  Vital signs in last 24 hours: Temp:  [96.5 F (35.8 C)-99.1 F (37.3 C)] 98.6 F (37 C) (12/21 0649) Pulse Rate:  [60-75] 60 (12/21 0649) Resp:  [11-24] 20 (12/21 0649) BP: (86-110)/(53-85) 104/65 (12/21 0649) SpO2:  [96 %-100 %] 96 % (12/21 0649) Weight:  [85.9 kg] 85.9 kg (12/21 0655) Last BM Date: 01/13/21 General:   Alert 75 year ill appearing female in NAD.  Eyes: No scleral icterus. Heart: Regular rate and rhythm, no murmurs. Pulm: Breath sounds clear anteriorly, diminished in the bases. Abdomen: Abdomen with gaseous distention and ascites, abdomen is not tense.  Moderate tenderness throughout the upper abdomen without rebound or guarding.  Positive bowel sounds all 4 quadrants. Extremities:  Bilateral lower  extremities with mild edema.  Neurologic:  Alert and  oriented x 4.  Speech is clear.  Moves all extremities weakly. Psych:  Alert and cooperative. Normal mood and affect.  Intake/Output from previous day: 12/20 0701 - 12/21 0700 In: 2583.9 [P.O.:180; I.V.:638.4; Blood:326.7; IV Piggyback:1438.9] Out: 950 [Urine:950] Intake/Output this shift: No intake/output data recorded.  Lab Results: Recent Labs    01/13/21 0423 01/13/21 1233 01/14/21 0415  WBC 1.5* 1.7* 1.0*  HGB 7.8* 8.1* 6.7*  HCT 23.9* 24.0* 19.5*  PLT 37* 55* 50*   BMET Recent Labs    01/12/21 1153 01/13/21 0423 01/14/21 0415  NA 131* 132* 131*  K 4.1 4.2 3.8  CL 106 106 105  CO2 22 20* 20*  GLUCOSE 99 94 94  BUN 18 16 18   CREATININE 1.22* 1.22* 1.31*  CALCIUM 7.8* 7.7* 7.9*   LFT Recent Labs    01/14/21 0415  PROT 5.3*  ALBUMIN 3.4*  AST 38  ALT 21  ALKPHOS 52  BILITOT 1.8*  BILIDIR 0.6*  IBILI 1.2*   PT/INR Recent Labs    01/13/21 0423 01/14/21 0415  LABPROT 34.3* 37.8*  INR 3.4* 3.8*   Hepatitis Panel No results for input(s): HEPBSAG, HCVAB, HEPAIGM, HEPBIGM in the last 72 hours.  CT Abdomen Pelvis Wo Contrast  Result Date: 01/12/2021 CLINICAL DATA:  Abdominal pain and dyspnea since last night, dark stools, blood in stool, abdominal distension for 1 month; history cirrhosis, drug-induced parkinsonism, GERD, stage IV chronic kidney disease EXAM: CT ABDOMEN AND PELVIS WITHOUT CONTRAST TECHNIQUE: Multidetector CT imaging of the abdomen and pelvis was performed following the standard  protocol without IV contrast. COMPARISON:  11/14/2019 FINDINGS: Lower chest: Lung bases clear.  RIGHT ventricular pacemaker lead. Hepatobiliary: Liver small without focal mass. Post cholecystectomy. Pancreas: Atrophic pancreas without mass Spleen: Normal appearance Adrenals/Urinary Tract: Adrenal glands normal appearance. Peripelvic cyst central LEFT kidney again seen 2.6 x 2.8 cm. Tiny hyperdense nodule lower LEFT  kidney laterally, corresponding to a small cyst seen on the previous exam, decreased in size but increased in attenuation. Bladder decompressed. Stomach/Bowel: Displacement of bowel loops by ascites. Sigmoid diverticulosis without evidence of diverticulitis. Stomach and remaining bowel loops unremarkable. Vascular/Lymphatic: Atherosclerotic calcifications aorta and iliac arteries without aneurysm. No adenopathy. Perisplenic collaterals. Reproductive: Uterus surgically absent. Nodular soft tissue foci in the pelvis bilaterally likely represent the ovaries as noted on prior exam. Other: Marked ascites. Diffuse soft tissue edema. No free air. No hernia. Musculoskeletal: Osseous demineralization. Degenerative changes LEFT hip joint. IMPRESSION: Marked ascites. Small liver consistent with known cirrhosis with note of perisplenic collaterals. Sigmoid diverticulosis without evidence of diverticulitis. Aortic Atherosclerosis (ICD10-I70.0). Electronically Signed   By: Lavonia Dana M.D.   On: 01/12/2021 13:23   US Paracentesis  Result Date: 01/12/2021 INDICATION: Autoimmune hepatitis.  Ascites. EXAM: ULTRASOUND GUIDED  PARACENTESIS MEDICATIONS: None. COMPLICATIONS: None immediate. PROCEDURE: Informed written consent was obtained from the patient after a discussion of the risks, benefits and alternatives to treatment. A timeout was performed prior to the initiation of the procedure. Initial ultrasound scanning demonstrates a large amount of ascites within the right lower abdominal quadrant. The right lower abdomen was prepped and draped in the usual sterile fashion. 1% tetracaine was used for local anesthesia. Following this, a Yueh catheter was introduced. An ultrasound image was saved for documentation purposes. The paracentesis was performed. The catheter was removed and a dressing was applied. The patient tolerated the procedure well without immediate post procedural complication. Patient received post-procedure  intravenous albumin; see nursing notes for details. Procedure was performed by Rowe Robert, PA-C. FINDINGS: A total of approximately 3.5 L of yellow fluid was removed. Samples were sent to the laboratory as requested by the clinical team. IMPRESSION: Successful ultrasound-guided paracentesis yielding 3.5 liters of peritoneal fluid. Electronically Signed   By: Markus Daft M.D.   On: 01/12/2021 17:34   DG Chest Port 1 View  Result Date: 01/12/2021 CLINICAL DATA:  Shortness of breath EXAM: PORTABLE CHEST 1 VIEW COMPARISON:  Chest radiograph dated August 31, 2019 FINDINGS: The heart size and mediastinal contours are within normal limits. Pacemaker leads terminating in the right atrium and right ventricle. Both lungs are clear. The visualized skeletal structures are unremarkable. IMPRESSION: No active disease. Electronically Signed   By: Keane Police D.O.   On: 01/12/2021 12:57    Assessment / Plan:  62) 74 year old female with autoimmune hepatitis with decompensated cirrhosis, ascites, esophageal varices and pancytopenia admitted to the hospital 01/12/2021 with generalized abdominal pain, rectal bleeding and melena x 1 day. Started on PPI infusion and Octreotide infusion. Admission Hg 10.1 (baseline Hg 9 - 10.07) -> Hg 7.8. CTAP wo contrast showed cirrhosis with marked ascites. S/P paracentesis 12/19, 3.5 L of peritoneal fluid removed.  No SBP. No overt hepatic encephalopathy.  Off Prograf since 11/06/2020.  LFTs stable.  Last IgG level was 3,077 on 12/15. S/P EGD 12/21 showed grade II esophageal varices without stigmata of recent bleeding, gastritis and portal hypertensive gastropathy.  Gastric biopsies pending.  Octreotide dc'd. A flexible sig 12/20 showed brown stool in the rectum, sigmoid, descending and transverse colon, multiple nonbleeding  polyps were seen in the sigmoid, descending and transverse colon which were not removed as she is at risk for bleeding and diverticulosis was present throughout the  examined colon. Hg dropped to 6.7 this am. Transfused 1 unit of PRBCs, post H/H pending. She passed one nonbloody brown BM last night. No overt GI bleeding this am. BP 107/73. Started on Midodrine. Liver doppler today showed ascites, no evidence of hepatic or portal vein thrombosis.  -Await post transfusion H/H results -Transfuse for Hg level < 7.0 -CBC in am -No plans for further endoscopic evaluation at this time as she is not actively GI bleeding -Continue Cipro 400mg  IV bid for prophylaxis  -Continue PPI po bid -She will likely require a therapeutic paracentesis in 1 to 2 days -2 Gm low sodium diet as tolerated  -Pain management per the hospitalist -She will require outpatient follow up with Dr. Tarri Glenn regarding management of autoimmune hepatitis -Await further recommendations from Dr. Lorenso Courier   2) Pancytopenia. WBC 1.0. Hg 7.8 -> 6.7. Transfused 1 unit of PRBCs this am.  PLT 37 -> transfused 1 pack of platelets 12/20 prior EGD/flex sig -> today PLT count 50.  CTAP without evidence of splenomegaly. Seen by hematology today, pancytopenia likely due to her autoimmune hepatitis superimposed by bone marrow suppression her current illness.  Iron , G01 and folic acid levels ordered.  To consider G-CSF if neutrophil count does not improve in 48 hrs.  -Appreciate hematology recommendation   3) Coagulopathy secondary to cirrhosis. INR 2.4 -> 3.4.  -Daily INR   4) GERD, history of gastric ulcers -See plan in # 1   5) History of adenomatous and tubulovillous colon polyps -Follow up colonoscopy as an outpatient    6) History of a stable pancreatic tail cyst   7) CKD. Cr. 1.22 -> 1.31  Principal Problem:   Ascites of liver Active Problems:   Leukopenia   Hypothyroidism   Autoimmune hepatitis ([place])   Esophageal varices determined by endoscopy ([place])   Hyponatremia   Chronic blood loss anemia   Abdominal ascites     LOS: 1 day   Noralyn Pick  01/14/2021, 11:26 AM

## 2021-01-14 NOTE — Progress Notes (Signed)
Initial Nutrition Assessment  DOCUMENTATION CODES:   Not applicable  INTERVENTION:  - will order Ensure Enlive once/day, each supplement provides 350 kcal and 20 grams of protein. - will order 30 ml Prosource Plus BID, each supplement provides 100 kcal and 15 grams protein.  - complete NFPE when feasible.    NUTRITION DIAGNOSIS:   Increased nutrient needs related to acute illness as evidenced by estimated needs.  GOAL:   Patient will meet greater than or equal to 90% of their needs  MONITOR:   PO intake, Supplement acceptance, Labs, Weight trends  REASON FOR ASSESSMENT:   Malnutrition Screening Tool    ASSESSMENT:   75 y.o. female with medical history of colon polyps, anxiety, BLE edema, bipolar disorder, insomnia, cutaneous horn, drug-induced Parkinsonism, esophageal varices, fibromyalgia, gastric ulcer, GERD, gingival abscess, glaucoma, CAD, hx of MI, hypothyroidism, migraine headaches, neutropenia, osteoporosis, paranoid schizophrenia, polyarthritis, rectal mass, seizure disorder, sick sinus syndrome, pacemaker placement, non-hemorrhagic stroke, and thrombocytopenia. She presented to the ED due to progressively worsening abdominal distention x1 month, worsening dyspnea, BLE edema, and abdominal pain.  Unable to see patient. Diet advanced from NPO to CLD on 12/19 at 1638 and then changed back to NPO yesterday at midnight and then advanced to 2 gram Na yesterday at 1546. No intakes documented.  She has not been seen by a [place] RD at any time in the past.  Weight today is 189 lb and weight on 12/19 was documented as 177 lb. Weight is noted to be significantly up over the past 2 months, but deep pitting edema to BLE documented in the edema section of flow sheet.     Labs reviewed; Na: 131 mmol/l, creatinine: 1.31 mg/dl, Ca: 7.9 mg/dl, GFR: 42 ml/min.  Medications reviewed; 50 mcg oral synthroid/day, 40 mg oral protonix BID.     NUTRITION - FOCUSED PHYSICAL  EXAM:  Unable to complete at this time.   Diet Order:   Diet Order             Diet 2 gram sodium Room service appropriate? Yes; Fluid consistency: Thin  Diet effective now                   EDUCATION NEEDS:   No education needs have been identified at this time  Skin:  Skin Assessment: Reviewed RN Assessment  Last BM:  12/20 (type 6)  Height:   Ht Readings from Last 1 Encounters:  01/14/21 5\' 9"  (1.753 m)    Weight:   Wt Readings from Last 1 Encounters:  01/14/21 85.9 kg    Estimated Nutritional Needs:  Kcal:  1720-1975 kcal Protein:  90-100 grams Fluid:  >/= 1.8 L/day       Jarome Matin, MS, RD, LDN, CNSC Inpatient Clinical Dietitian RD pager # available in [facility]  After hours/weekend pager # available in [facility]

## 2021-01-14 NOTE — Progress Notes (Signed)
°  Progress Note    Stacey Velazquez   WUG:[id number]  DOB: 1945/07/31  DOA: 01/12/2021     1 Date of Service: 01/14/2021      Brief summary: Stacey Velazquez is a 75 y.o. F with cirrhosis due to AIH, SSS s/p PPM, CAD s/p remote PCI, CVA without residuals, CKD who presented with rectal bleeding, melena, and abdominal distension/BLE swelling. She was found to have a Hb drop from 10 > 7.    12/19: Paracentesis 12/20: EGD colon        Assessment and Plan * Ascites of liver S/p paracentesis 2 days ago.  Belly seems better  Elevated serum creatinine CKD IV ruled out.  Cr variable recently.    - Trend Cr  Anemia associated with chemotherapy - Transfuse one unit now - Repeat H/H post-transfusion  Chronic blood loss anemia - Consult GI  Hyponatremia - Fluid restriction  Autoimmune hepatitis (HCC) -Continue cipro, PPI, midodrine, albumin  Hypothyroidism -Continue levothyroxine  Pancytopenia (HCC) - Consult heme     Subjective:  Patient is bloated and constipated.  She seems somewhat confused.  No bleeding.  No hematochezia.  No fever.  Objective Vitals:   01/14/21 0938 01/14/21 1444 01/14/21 1446 01/14/21 1525  BP: 107/73 107/76 106/69 111/78  Pulse: 61 62 64   Resp: 18 18 18    Temp: (!) 97.5 F (36.4 C) 98.4 F (36.9 C) 98.4 F (36.9 C)   TempSrc: Oral     SpO2: 98% 100% 99%   Weight:      Height:       85.9 kg  Vital signs were reviewed and unremarkable except for: Blood pressure soft   Exam General appearance: Elderly adult female, lying in bed, appears tired and weak.     HEENT: Anicteric, conjunctival pink, edentulous, oropharynx dry Skin: No suspicious rashes or lesions Cardiac: RRR, no murmurs, mild pitting lower extremity edema Respiratory: Normal respiratory rate and rhythm, lungs clear without rales or wheezes Abdomen: Abdomen somewhat distended, no tense ascites, no focal tenderness palpation MSK: Continues muscle mass and fat Neuro:  Awake, moves upper extremities with severe generalized weakness, symmetric strength Psych:Attention blunted, judgment insight appear moderately impaired    Labs / Other Information My review of labs, imaging, notes and other tests is significant for Elevated INR, creatinine stable from yesterday, sodium 131, hemoglobin down to 6.7 this morning     Disposition Plan: Status is: Inpatient  Remains inpatient appropriate because: Prior vitamin K, IV antibiotics, and then advancing her diet, and stabilization of her hemoglobin  Called to family, no answer        [facility] 01/14/2021, 6:06 PM

## 2021-01-14 NOTE — Assessment & Plan Note (Signed)
Continue levothyroxine 

## 2021-01-14 NOTE — Progress Notes (Signed)
Chaplain engaged in an initial visit with Remo Lipps.  Ezme shared about her faith.  She expressed that she was saved at 75 years old.  Justis recited several scriptures that have gotten her through tough times.  Chaplain offered listening, support, presence and prayer with her.  She was grateful for Chaplain's visit.     01/14/21 1100  Clinical Encounter Type  Visited With Patient  Visit Type Initial;Spiritual support  Spiritual Encounters  Spiritual Needs Prayer

## 2021-01-14 NOTE — Assessment & Plan Note (Signed)
-   Consult heme

## 2021-01-14 NOTE — Progress Notes (Addendum)
PT Cancellation Note  Patient Details Name: Stacey Velazquez MRN: [id number] DOB: 03/07/1945   Cancelled Treatment:    Reason Eval/Treat Not Completed: Medical issues which prohibited therapy--hgb 6.7. will hold PT for now. will check back after transfusion if able.     [facility]  Office: [phone] Pager: [phone]

## 2021-01-14 NOTE — Plan of Care (Signed)

## 2021-01-14 NOTE — Hospital Course (Addendum)
Stacey Velazquez is a 75 y.o. F with cirrhosis due to AIH, SSS s/p PPM, CAD s/p remote PCI, CVA without residuals, CKD who presented with rectal bleeding, melena, and abdominal distension/BLE swelling.   In the ER, she was found to have a Hb drop from 10 > 7, elevated INR and ascites.   12/19: Admitted, GI consulted for GI bleeding, underwent paracentesis 3.5L fluid 12/20: EGD colon showed varices, gastritis but no sitgmata of recent bleeding, flex sig with stool, but no source identified, no melena in colon 12/21: Hgb down to 6.7 g/dL, transfused 1 unit; Liver doppler negative for thrombosis, mentation worsening --> albumin started 12/22: Repeat paracentesis again 3L, no SBP again, mentation and renal function improved with albumin, vit K 12/23: Steroids started 12/24-27: Improving on steroids but Hgb trending down

## 2021-01-14 NOTE — Assessment & Plan Note (Signed)
-   Consult GI

## 2021-01-14 NOTE — Evaluation (Signed)
Occupational Therapy Evaluation Patient Details Name: Stacey Velazquez MRN: [id number] DOB: 1945-09-14 Today's Date: 01/14/2021   History of Present Illness 75 y.o. female who presented to [facility] ED with progressively worsening abdominal distention for the past month associated with worsening dyspnea, lower extremity edema and abdominal pain that excalated. Associated with few days of melena and hematochezia.  No nausea or vomiting. Paracentisis done on 01/12/2021 with 3.5 L of fluid removed by IR.  Abdominal fluid not suggestive of SBP. PMH significant for  autoimmune hepatitis,  with abdominal ascites, anemia of chronic disease, colon polyps, anxiety, bilateral lower extremity edema, bipolar disorder with insomnia, cutaneous horn, drug-induced parkinsonism, esophageal varices, fibromyalgia, gastric ulcer, GERD, gingival abscess, glaucoma, CAD, history of MI, hypothyroidism, migraine headaches, neutropenia, osteoporosis, paranoid schizophrenia, polyarthritis, rectal mass, seizure disorder, sinus arrest, sick sinus syndrome, pacemaker placement, history of nonhemorrhagic stroke, thrombocytopenia   Clinical Impression   Chart reviewed, RN cleared pt for participation in OT evaluation. MD Danford also cleared pt for mobility via secure chat. Pt greeted, alert and oriented x4, agreeable to session. Pt limited by pain, reporting 10/10 pain with mobility. Pt reports PTA she spent much of her time in bed due to dizziness, utilized mwc and RW around her second floor apartment. She bathes via sink bath and prepares microwave meals. She has friends that assist with IADL such as getting her mail, medications, etc. Pt reports she cannot exit her apartment with mwc or RW as she cannot safely push door open/hold it open. On eval pt required MIN-MOD A for supine<>sit with significantly increased time. Maintain seated at EOB for less than 30 seconds due to reported dizziness, BP 113/68. Grooming tasks completed at bed level  with SET UP. Pt presents with functional deficits(see below) affecting safe ADL completion. She would benefit from discharge to STR to address deficits to facilities discharge and ADL completion. Pt is left as received, NAD, all needs met. RN aware of pt status. OT will continue to follow acutely.      Recommendations for follow up therapy are one component of a multi-disciplinary discharge planning process, led by the attending physician.  Recommendations may be updated based on patient status, additional functional criteria and insurance authorization.   Follow Up Recommendations  Skilled nursing-short term rehab (<3 hours/day)    Assistance Recommended at Discharge Frequent or constant Supervision/Assistance  Functional Status Assessment  Patient has had a recent decline in their functional status and demonstrates the ability to make significant improvements in function in a reasonable and predictable amount of time.  Equipment Recommendations   (per next venue of care)    Recommendations for Other Services       Precautions / Restrictions Precautions Precautions: Fall Restrictions Weight Bearing Restrictions: No      Mobility Bed Mobility Overal bed mobility: Needs Assistance Bed Mobility: Supine to Sit;Sit to Supine;Rolling Rolling: Mod assist   Supine to sit: Min assist;Mod assist;HOB elevated Sit to supine: Min assist;Mod assist;HOB elevated   General bed mobility comments: significantly increased time, pt with 10/10 pain with all mobility    Transfers Overall transfer level: Needs assistance   Transfers: Sit to/from Stand             General transfer comment: declined STS due pain      Balance Overall balance assessment: Needs assistance Sitting-balance support: Feet supported;Bilateral upper extremity supported Sitting balance-Leahy Scale: Fair Sitting balance - Comments: Pt with L lateral lean onto raised HOB while seated at EOB, minimal tolerance  for  upright sitting                                   ADL either performed or assessed with clinical judgement   ADL Overall ADL's : Needs assistance/impaired Eating/Feeding: Set up;Bed level   Grooming: Wash/dry hands;Wash/dry face;Bed level;Set up Grooming Details (indicate cue type and reason): unable to tolerate at edge of bed due to reported pain Upper Body Bathing: Moderate assistance   Lower Body Bathing: Maximal assistance;Bed level   Upper Body Dressing : Bed level   Lower Body Dressing: Maximal assistance;Bed level       Toileting- Clothing Manipulation and Hygiene: Maximal assistance         General ADL Comments: significantly increased time for all tasks     Vision Patient Visual Report: No change from baseline       Perception     Praxis      Pertinent Vitals/Pain Pain Assessment: 0-10 Pain Score: 10-Worst pain ever Pain Location: generalized with mobility Pain Descriptors / Indicators: Constant Pain Intervention(s): Limited activity within patient's tolerance;Repositioned;Monitored during session;Utilized relaxation techniques;Relaxation     Hand Dominance     Extremity/Trunk Assessment Upper Extremity Assessment Upper Extremity Assessment: Generalized weakness   Lower Extremity Assessment Lower Extremity Assessment: Generalized weakness       Communication Communication Communication: No difficulties   Cognition Arousal/Alertness: Awake/alert Behavior During Therapy: WFL for tasks assessed/performed;Flat affect Overall Cognitive Status: No family/caregiver present to determine baseline cognitive functioning                                 General Comments: pt is alert and oriented x4, ?safety awarenss as she lives by herself and reports she has difficulty getting in and out of her front door due to mobility issues     General Comments  BP 111/78, HR 67 rest, 113/68 seated at EOB- pt reporting dizziness  throughout    Exercises Other Exercises Other Exercises: education re: role of OT, role of rehab, progressing mobility, pain management techniques, safe ADL completion at home   Shoulder Instructions      Home Living Family/patient expects to be discharged to:: Other (Comment)                                 Additional Comments: pt reports she lives in an apartment on the second floor, unable to hold door open with mwc or rw to exit apartment, reports she has friends to assist      Prior Functioning/Environment Prior Level of Function : Needs assist             Mobility Comments: use of RW, mwc; pt reports she has spent much time in bed the past month due to acute illness, dizziness when sitting up ADLs Comments: pt reports sink baths, uses BSC next to chair, does not wear socks, increased time for LB dressing; pt eats all microwaved meals, has friends that get mail for her        OT Problem List: Decreased strength;Impaired balance (sitting and/or standing);Pain;Decreased safety awareness;Decreased activity tolerance;Decreased knowledge of use of DME or AE      OT Treatment/Interventions: Self-care/ADL training;DME and/or AE instruction;Therapeutic activities;Balance training;Therapeutic exercise;Energy conservation;Patient/family education    OT Goals(Current goals can be found in the  care plan section) Acute Rehab OT Goals Patient Stated Goal: to feel better OT Goal Formulation: With patient Time For Goal Achievement: 01/28/21 Potential to Achieve Goals: Good ADL Goals Pt Will Perform Grooming: with modified independence;sitting Pt Will Perform Upper Body Dressing: with min assist;sitting Pt Will Perform Lower Body Dressing: with mod assist;sitting/lateral leans Pt Will Transfer to Toilet: with mod assist;bedside commode Pt Will Perform Toileting - Clothing Manipulation and hygiene: with min assist  OT Frequency: Min 2X/week   Barriers to D/C:  Inaccessible home environment;Decreased caregiver support          Co-evaluation              AM-PAC OT "6 Clicks" Daily Activity     Outcome Measure Help from another person eating meals?: A Little   Help from another person toileting, which includes using toliet, bedpan, or urinal?: A Little Help from another person bathing (including washing, rinsing, drying)?: A Lot Help from another person to put on and taking off regular upper body clothing?: A Lot Help from another person to put on and taking off regular lower body clothing?: A Lot 6 Click Score: 12   End of Session Nurse Communication: Mobility status  Activity Tolerance: Patient limited by pain Patient left: in bed;with call bell/phone within reach;with bed alarm set  OT Visit Diagnosis: Unsteadiness on feet (R26.81);Muscle weakness (generalized) (M62.81);Other abnormalities of gait and mobility (R26.89)                Time: 2998-0699 OT Time Calculation (min): 37 min Charges:  OT General Charges $OT Visit: 1 Visit OT Evaluation $OT Eval Moderate Complexity: 1 Mod OT Treatments $Self Care/Home Management : 8-22 mins  Shanon Payor, OTD OTR/L  01/14/21, 3:42 PM

## 2021-01-14 NOTE — Assessment & Plan Note (Signed)
-  Continue cipro, PPI, midodrine, albumin

## 2021-01-14 NOTE — Progress Notes (Signed)
HEMATOLOGY-ONCOLOGY PROGRESS NOTE  SUBJECTIVE: History of alcoholic liver cirrhosis admitted to the hospital with abdominal distention and shortness of breath.  She had bloody stools prior to coming into the hospital also had lower extremity edema.  On admission to the hospital, she had a hemoglobin of 10.1 and a white count of 2.5.  Today the white count is 1 and hemoglobin is 6.7 and platelet count of 50.  She had a paracentesis and 3.5 L of peritoneal fluid removed.  No active GI bleeding since hospitalization.  No hepatic encephalopathy.  Prograf.  11/06/2020.  This was being used for autoimmune hepatitis.  Patient has coagulopathy with an INR of 2.4.  Also has chronic kidney disease.  OBJECTIVE: REVIEW OF SYSTEMS:   Constitutional: Denies fevers, chills or abnormal weight loss As above   PHYSICAL EXAMINATION: ECOG PERFORMANCE STATUS: 2 - Symptomatic, <50% confined to bed  Vitals:   01/14/21 0649 01/14/21 0938  BP: 104/65 107/73  Pulse: 60 61  Resp: 20   Temp: 98.6 F (37 C) (!) 97.5 F (36.4 C)  SpO2: 96% 98%   Filed Weights   01/12/21 1147 01/14/21 0655  Weight: 177 lb 0.5 oz (80.3 kg) 189 lb 6 oz (85.9 kg)        LABORATORY DATA:  I have reviewed the data as listed CMP Latest Ref Rng & Units 01/14/2021 01/13/2021 01/12/2021  Glucose 70 - 99 mg/dL 94 94 99  BUN 8 - 23 mg/dL 18 16 18   Creatinine 0.44 - 1.00 mg/dL 1.31(H) 1.22(H) 1.22(H)  Sodium 135 - 145 mmol/L 131(L) 132(L) 131(L)  Potassium 3.5 - 5.1 mmol/L 3.8 4.2 4.1  Chloride 98 - 111 mmol/L 105 106 106  CO2 22 - 32 mmol/L 20(L) 20(L) 22  Calcium 8.9 - 10.3 mg/dL 7.9(L) 7.7(L) 7.8(L)  Total Protein 6.5 - 8.1 g/dL 5.3(L) 4.6(L) 6.4(L)  Total Bilirubin 0.3 - 1.2 mg/dL 1.8(H) 1.6(H) 1.6(H)  Alkaline Phos 38 - 126 U/L 52 72 105  AST 15 - 41 U/L 38 40 56(H)  ALT 0 - 44 U/L 21 24 37    Lab Results  Component Value Date   WBC 1.0 (LL) 01/14/2021   HGB 6.7 (LL) 01/14/2021   HCT 19.5 (L) 01/14/2021   MCV 91.1  01/14/2021   PLT 50 (L) 01/14/2021   NEUTROABS 0.5 (L) 01/14/2021    ASSESSMENT AND PLAN: 1.  Severe pancytopenia: WBC 1.0, ANC 0.5, hemoglobin 6.7, platelets 50 I suspect all of these are related to her autoimmune hepatitis superimposed by bone marrow suppression from the current illness. I would only recommend supportive care with blood transfusions and platelet transfusions if necessary. Since there is no active bleeding there is no need for platelet transfusion. Liver dysfunction with a high INR  2. we can wait and watch for spontaneous improvement of the neutrophil count.  If it does not improve in the next 48 hours, we may have to give her G-CSF injections to boost the production of WBC.  3.  Severe anemia: We will obtain iron studies, T15 folic acid as well as hemolysis work-up.  Blood smear does not show any evidence of TTP. Thank you much for consulting [place].

## 2021-01-14 NOTE — Assessment & Plan Note (Signed)
CKD IV ruled out.  Cr variable recently.    - Trend Cr

## 2021-01-14 NOTE — Assessment & Plan Note (Signed)
-   Fluid restriction 

## 2021-01-14 NOTE — Progress Notes (Signed)
OT Cancellation Note  Patient Details Name: Stacey Velazquez MRN: [id number] DOB: 09-Feb-1945   Cancelled Treatment:    Reason Eval/Treat Not Completed: Medical issues which prohibited therapy (pt hemoglobin is 6.7. Will hold at this time, follow up after transfusion as able.Shanon Payor, OTD OTR/L  01/14/21, 10:54 AM

## 2021-01-14 NOTE — Assessment & Plan Note (Signed)
-   Transfuse one unit now - Repeat H/H post-transfusion

## 2021-01-14 NOTE — Assessment & Plan Note (Signed)
S/p paracentesis 2 days ago.  Belly seems better

## 2021-01-15 ENCOUNTER — Inpatient Hospital Stay (HOSPITAL_COMMUNITY): Payer: Medicare Other

## 2021-01-15 DIAGNOSIS — G9341 Metabolic encephalopathy: Secondary | ICD-10-CM | POA: Diagnosis present

## 2021-01-15 DIAGNOSIS — R140 Abdominal distension (gaseous): Secondary | ICD-10-CM | POA: Diagnosis not present

## 2021-01-15 DIAGNOSIS — T451X5A Adverse effect of antineoplastic and immunosuppressive drugs, initial encounter: Secondary | ICD-10-CM

## 2021-01-15 DIAGNOSIS — R188 Other ascites: Secondary | ICD-10-CM

## 2021-01-15 DIAGNOSIS — N179 Acute kidney failure, unspecified: Secondary | ICD-10-CM | POA: Diagnosis not present

## 2021-01-15 DIAGNOSIS — D125 Benign neoplasm of sigmoid colon: Secondary | ICD-10-CM | POA: Diagnosis not present

## 2021-01-15 DIAGNOSIS — D702 Other drug-induced agranulocytosis: Secondary | ICD-10-CM

## 2021-01-15 DIAGNOSIS — K754 Autoimmune hepatitis: Secondary | ICD-10-CM | POA: Diagnosis not present

## 2021-01-15 LAB — COMPREHENSIVE METABOLIC PANEL
ALT: 25 U/L (ref 0–44)
AST: 47 U/L — ABNORMAL HIGH (ref 15–41)
Albumin: 2.9 g/dL — ABNORMAL LOW (ref 3.5–5.0)
Alkaline Phosphatase: 64 U/L (ref 38–126)
Anion gap: 4 — ABNORMAL LOW (ref 5–15)
BUN: 16 mg/dL (ref 8–23)
CO2: 21 mmol/L — ABNORMAL LOW (ref 22–32)
Calcium: 7.7 mg/dL — ABNORMAL LOW (ref 8.9–10.3)
Chloride: 103 mmol/L (ref 98–111)
Creatinine, Ser: 1.6 mg/dL — ABNORMAL HIGH (ref 0.44–1.00)
GFR, Estimated: 33 mL/min — ABNORMAL LOW (ref >60)
Glucose, Bld: 112 mg/dL — ABNORMAL HIGH (ref 70–99)
Potassium: 3.7 mmol/L (ref 3.5–5.1)
Sodium: 128 mmol/L — ABNORMAL LOW (ref 135–145)
Total Bilirubin: 2 mg/dL — ABNORMAL HIGH (ref 0.3–1.2)
Total Protein: 5.1 g/dL — ABNORMAL LOW (ref 6.5–8.1)

## 2021-01-15 LAB — PROTIME-INR
INR: 3.2 — ABNORMAL HIGH (ref 0.8–1.2)
Prothrombin Time: 32.5 seconds — ABNORMAL HIGH (ref 11.4–15.2)

## 2021-01-15 LAB — BPAM RBC
Blood Product Expiration Date: 202301112359
Blood Product Expiration Date: 202301162359
ISSUE DATE / TIME: 202212210542
ISSUE DATE / TIME: 202212210621
Unit Type and Rh: 6200
Unit Type and Rh: 6200

## 2021-01-15 LAB — TYPE AND SCREEN
ABO/RH(D): A POS
Antibody Screen: NEGATIVE
Unit division: 0
Unit division: 0

## 2021-01-15 LAB — CBC WITH DIFFERENTIAL/PLATELET
Abs Immature Granulocytes: 0.01 10*3/uL (ref 0.00–0.07)
Basophils Absolute: 0 10*3/uL (ref 0.0–0.1)
Basophils Relative: 0 %
Eosinophils Absolute: 0 10*3/uL (ref 0.0–0.5)
Eosinophils Relative: 3 %
HCT: 25.9 % — ABNORMAL LOW (ref 36.0–46.0)
Hemoglobin: 9 g/dL — ABNORMAL LOW (ref 12.0–15.0)
Immature Granulocytes: 1 %
Lymphocytes Relative: 34 %
Lymphs Abs: 0.4 10*3/uL — ABNORMAL LOW (ref 0.7–4.0)
MCH: 30.6 pg (ref 26.0–34.0)
MCHC: 34.7 g/dL (ref 30.0–36.0)
MCV: 88.1 fL (ref 80.0–100.0)
Monocytes Absolute: 0.2 10*3/uL (ref 0.1–1.0)
Monocytes Relative: 17 %
Neutro Abs: 0.5 10*3/uL — ABNORMAL LOW (ref 1.7–7.7)
Neutrophils Relative %: 45 %
Platelets: 56 10*3/uL — ABNORMAL LOW (ref 150–400)
RBC: 2.94 MIL/uL — ABNORMAL LOW (ref 3.87–5.11)
RDW: 14.6 % (ref 11.5–15.5)
WBC: 1.1 10*3/uL — CL (ref 4.0–10.5)
nRBC: 0 % (ref 0.0–0.2)

## 2021-01-15 LAB — ALBUMIN, PLEURAL OR PERITONEAL FLUID: Albumin, Fluid: <1.5 g/dL

## 2021-01-15 LAB — BODY FLUID CELL COUNT WITH DIFFERENTIAL
Eos, Fluid: 0 %
Lymphs, Fluid: 51 %
Monocyte-Macrophage-Serous Fluid: 42 % — ABNORMAL LOW (ref 50–90)
Neutrophil Count, Fluid: 7 % (ref 0–25)
Total Nucleated Cell Count, Fluid: 91 cu mm (ref 0–1000)

## 2021-01-15 LAB — SURGICAL PATHOLOGY

## 2021-01-15 LAB — PROTEIN, PLEURAL OR PERITONEAL FLUID: Total protein, fluid: <3 g/dL

## 2021-01-15 LAB — HAPTOGLOBIN: Haptoglobin: 11 mg/dL — ABNORMAL LOW (ref 42–346)

## 2021-01-15 LAB — SODIUM, URINE, RANDOM: Sodium, Ur: 41 mmol/L

## 2021-01-15 LAB — AMMONIA: Ammonia: 53 umol/L — ABNORMAL HIGH (ref 9–35)

## 2021-01-15 LAB — CREATININE, URINE, RANDOM: Creatinine, Urine: 112.57 mg/dL

## 2021-01-15 IMAGING — US US PARACENTESIS
1 series · 4 of 4 positions shown · non-contrast
Comparison: none

INDICATION: Patient with a history of autoimmune hepatitis and recurrent
ascites. Interventional radiology asked to perform a therapeutic and
diagnostic paracentesis up to 3.5 L.

[Series 1: us paracentesis mc & wl · 4 of 4 slices shown]
[im 1/4]
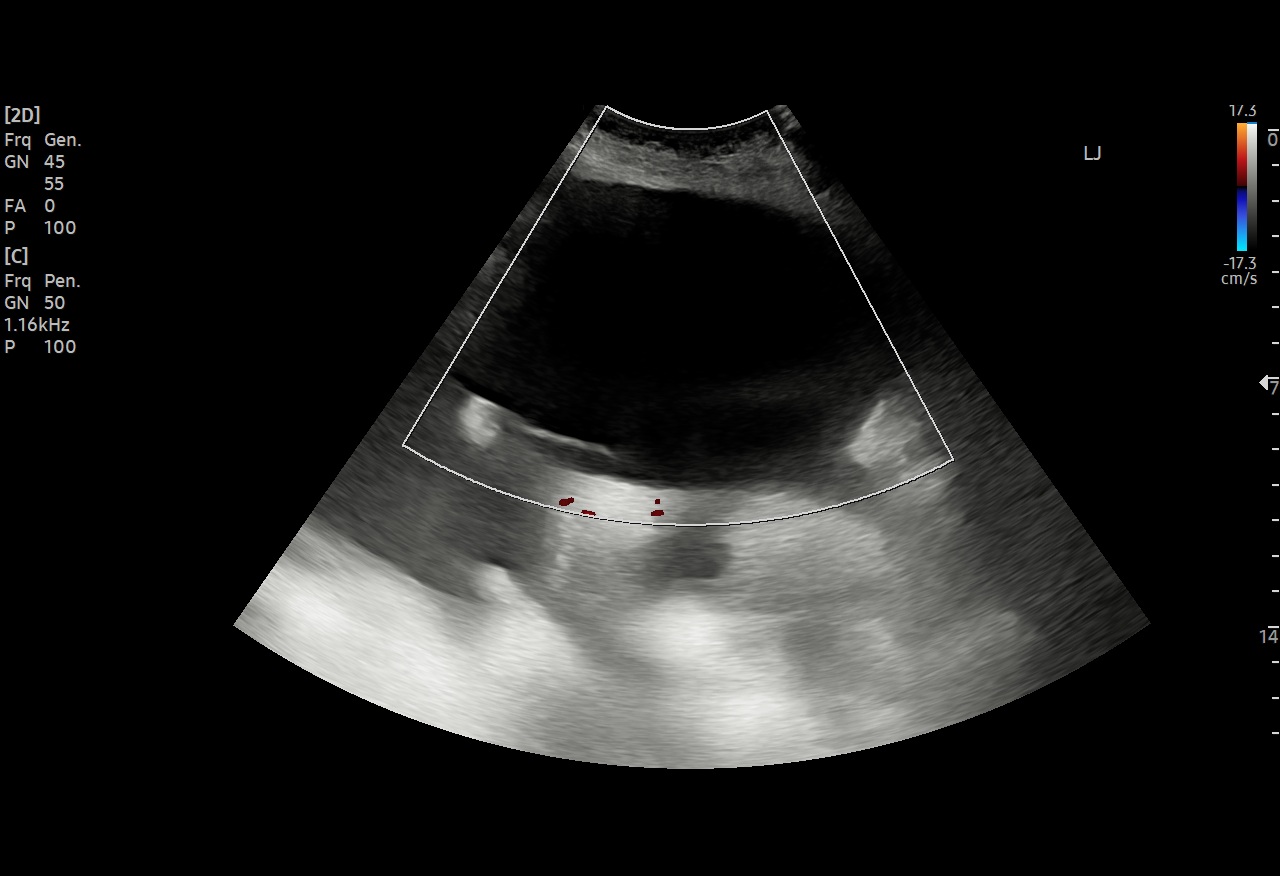
[im 2/4]
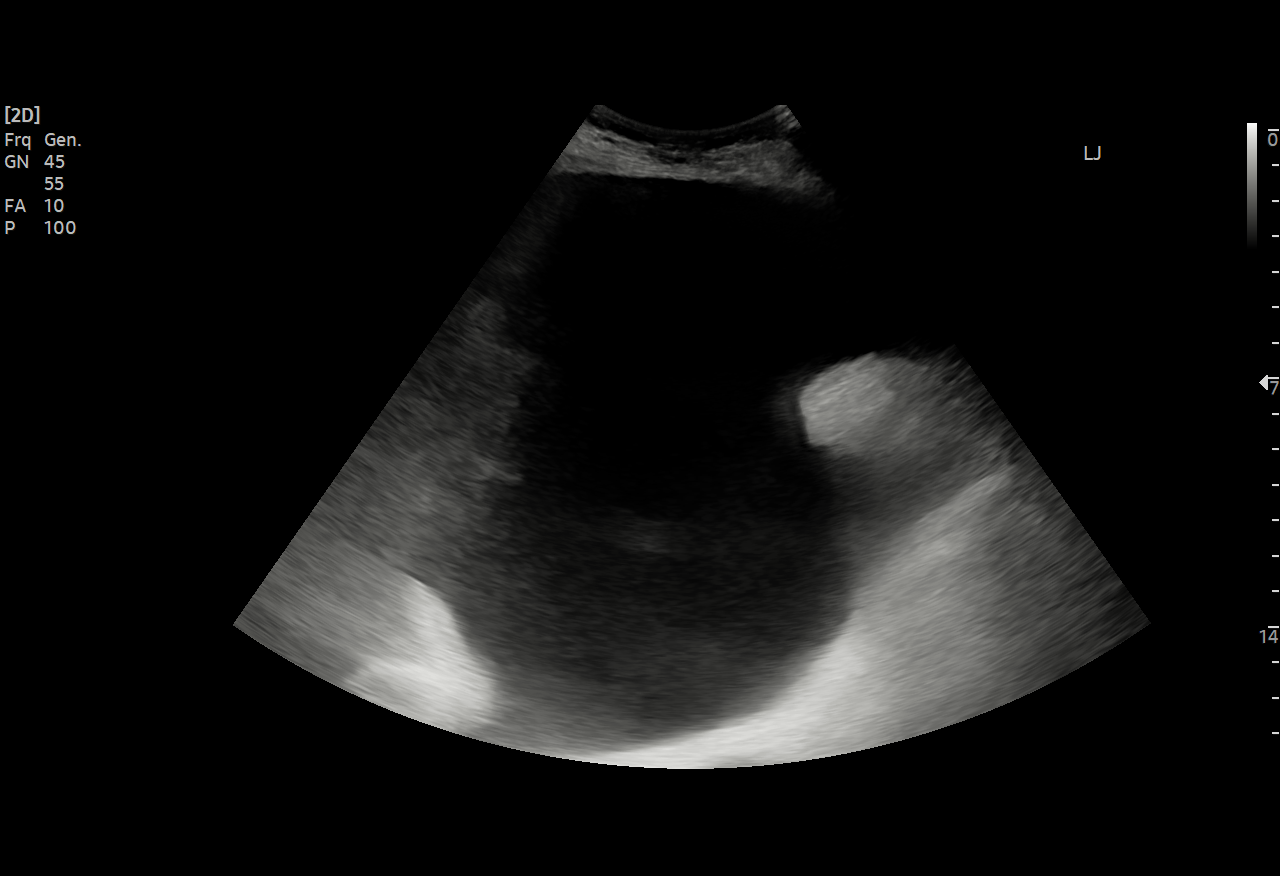
[im 3/4]
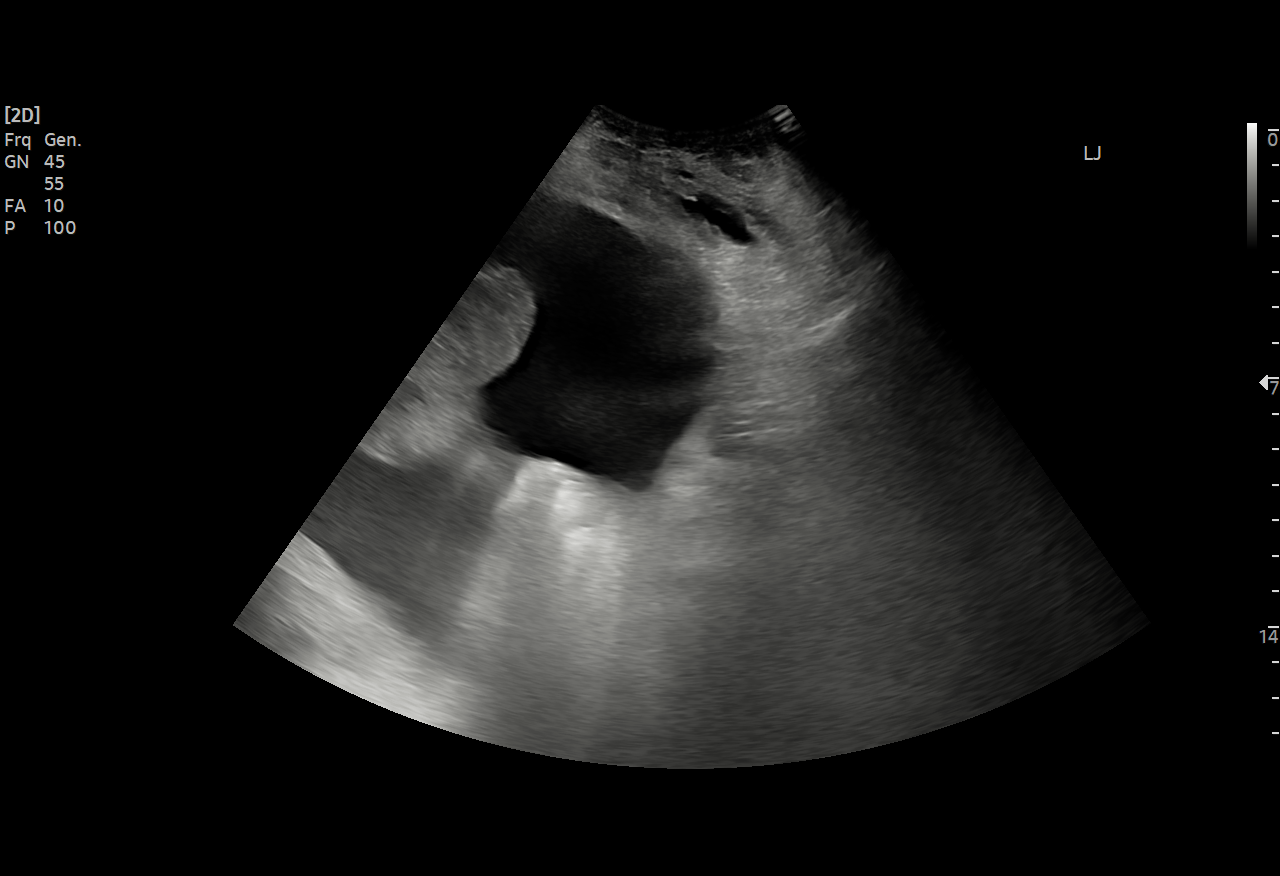
[im 4/4]
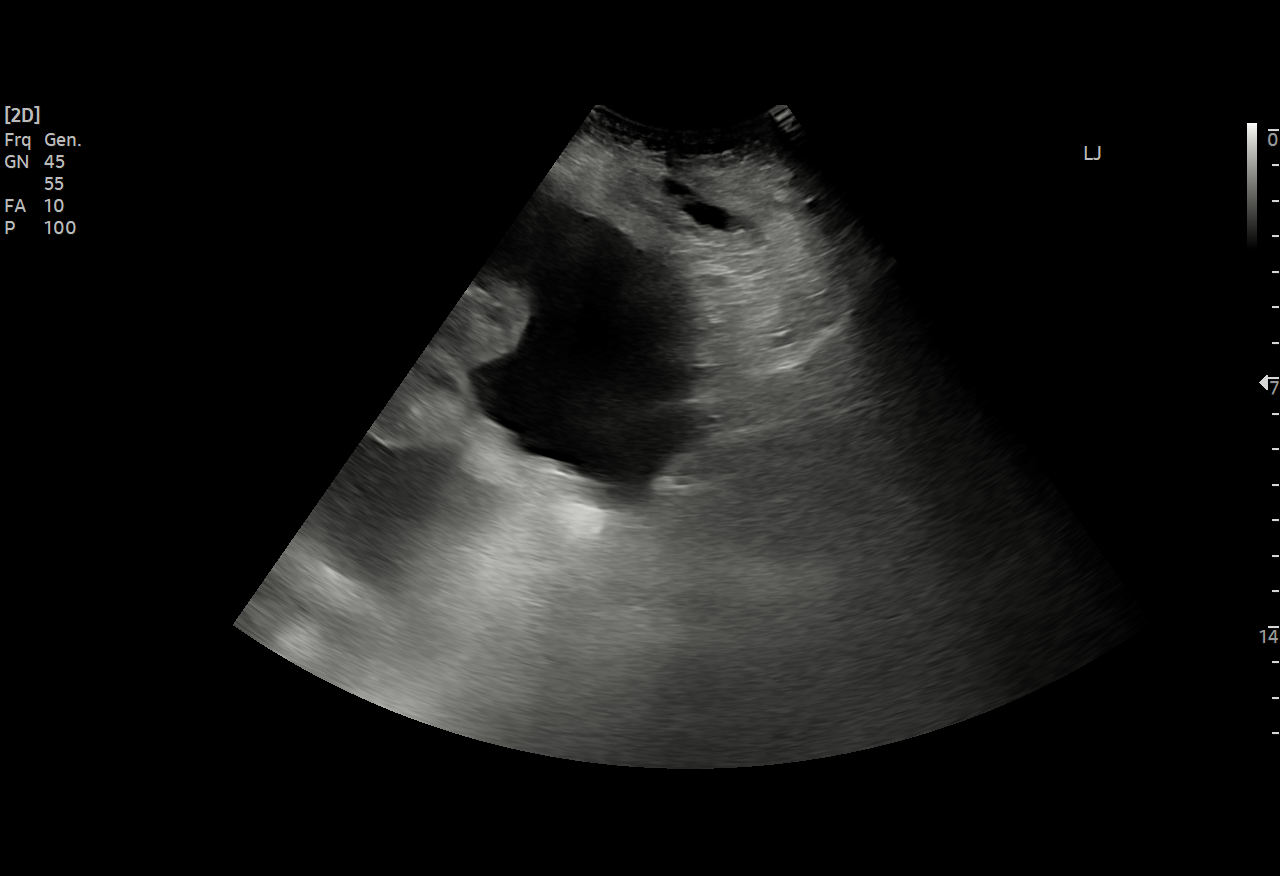

[4 of 4 positions shown; findings below may reference images not displayed]

EXAM:
ULTRASOUND GUIDED PARACENTESIS

MEDICATIONS:
1% tetracaine 2 mL

COMPLICATIONS:
None immediate.

PROCEDURE:
Informed written consent was obtained from the patient after a
discussion of the risks, benefits and alternatives to treatment. A
timeout was performed prior to the initiation of the procedure.

Initial ultrasound scanning demonstrates a large amount of ascites
within the left lower abdominal quadrant. The left lower abdomen was
prepped and draped in the usual sterile fashion. 1% tetracaine 2 mL
was used for local anesthesia.

Following this, a 19 gauge, 7-cm, Yueh catheter was introduced. An
ultrasound image was saved for documentation purposes. The
paracentesis was performed. The catheter was removed and a dressing
was applied. The patient tolerated the procedure well without
immediate post procedural complication.
FINDINGS: A total of approximately 3.5 L of clear yellow fluid was removed.
Samples were sent to the laboratory as requested by the clinical
team.
IMPRESSION: Successful ultrasound-guided paracentesis yielding 3.5 liters of
peritoneal fluid. Read by: TIGER, NP

## 2021-01-15 MED ORDER — ALBUMIN HUMAN 25 % IV SOLN
25.0000 g | Freq: Four times a day (QID) | INTRAVENOUS | Status: AC
  Administered 2021-01-15: 20:00:00 25 g via INTRAVENOUS
  Filled 2021-01-15 (×2): qty 100

## 2021-01-15 MED ORDER — TBO-FILGRASTIM 480 MCG/0.8ML ~~LOC~~ SOSY
480.0000 ug | PREFILLED_SYRINGE | Freq: Every day | SUBCUTANEOUS | Status: DC
  Administered 2021-01-15: 11:00:00 480 ug via SUBCUTANEOUS
  Filled 2021-01-15: qty 0.8

## 2021-01-15 MED ORDER — TETRACAINE HCL 1 % IJ SOLN
10.0000 mg | Freq: Once | INTRAMUSCULAR | Status: DC
  Filled 2021-01-15: qty 2

## 2021-01-15 NOTE — Progress Notes (Signed)
[facility] Progress Note  CC: Autoimmune hepatitis  Subjective: She is more talkative today. She passed a large soft brown bowel movement this morning and she feels much better today. Less upper abdominal tightness. No CP or SOB. She sat up at the side of the bed with physical therapy assistance. No family at the bedside.   Objective:  Vital signs in last 24 hours: Temp:  [97.5 F (36.4 C)-98.4 F (36.9 C)] 97.8 F (36.6 C) (12/22 0401) Pulse Rate:  [61-70] 69 (12/22 0401) Resp:  [16-20] 16 (12/22 0401) BP: (94-121)/(57-78) 94/57 (12/22 0401) SpO2:  [98 %-100 %] 98 % (12/22 0401) Last BM Date: 01/15/21  General: Alert 75 year old female in no acute distress. Eyes: No scleral icterus. Heart: Regular rate and rhythm, no murmurs. Pulm: Breath sounds diminished throughout, no wheezes, crackles or rhonchi. Abdomen: Upper abdomen is distended with ascites, less gaseous distension, abdomen is not tense. Moderate tenderness to the epigastric, RUQ and throughout the lower abdomen without rebound or guarding. No palpable mass.  GU: Indwelling Foley catheter intact with clear yellow urine. Extremities: Bilateral lower extremities with 1+ pitting edema, sequential compression stockings in use. Neurologic:  Alert and  oriented x 4. No asterixis. Speech is clear. She moves all extremities equally.  Psych:  Alert and cooperative. Normal mood and affect.  Intake/Output from previous day: 12/21 0701 - 12/22 0700 In: 1373.5 [P.O.:530; Blood:393.5; IV Piggyback:450] Out: 950 [Urine:950] Intake/Output this shift: No intake/output data recorded.  Lab Results: Recent Labs    01/14/21 0415 01/14/21 1221 01/15/21 0407  WBC 1.0* 1.2* 1.1*  HGB 6.7* 8.4* 9.0*  HCT 19.5* 24.3* 25.9*  PLT 50* 53* 56*   BMET Recent Labs    01/13/21 0423 01/14/21 0415 01/15/21 0407  NA 132* 131* 128*  K 4.2 3.8 3.7  CL 106 105 103  CO2 20* 20* 21*  GLUCOSE 94 94 112*  BUN 16 18 16    CREATININE 1.22* 1.31* 1.60*  CALCIUM 7.7* 7.9* 7.7*   LFT Recent Labs    01/14/21 0415 01/15/21 0407  PROT 5.3* 5.1*  ALBUMIN 3.4* 2.9*  AST 38 47*  ALT 21 25  ALKPHOS 52 64  BILITOT 1.8* 2.0*  BILIDIR 0.6*  --   IBILI 1.2*  --    PT/INR Recent Labs    01/14/21 0415 01/15/21 0407  LABPROT 37.8* 32.5*  INR 3.8* 3.2*   Hepatitis Panel No results for input(s): HEPBSAG, HCVAB, HEPAIGM, HEPBIGM in the last 72 hours.  DG Abd Portable 1V  Result Date: 01/14/2021 CLINICAL DATA:  Abdominal pain and distension. History of cirrhosis with ascites. EXAM: PORTABLE ABDOMEN - 1 VIEW COMPARISON:  Radiographs 08/21/2014.  CT 01/12/2021. FINDINGS: 1455 hours. The bowel gas pattern is nonobstructive. There is central displacement of the bowel consistent with known underlying ascites. Mild bowel wall thickening, similar to recent CT and likely related to ascites/liver disease as well. No supine evidence of free intraperitoneal air. Surgical clips are present related to prior cholecystectomy with a stable surgical clip in the right pelvis. Advanced asymmetric left hip osteoarthritis noted. IMPRESSION: No evidence of acute abdominal process. Known findings related to previously demonstrated ascites. Electronically Signed   By: Richardean Sale M.D.   On: 01/14/2021 15:08   US LIVER DOPPLER  Result Date: 01/14/2021 CLINICAL DATA:  Ascites, rule out portal or hepatic venous thrombosis EXAM: DUPLEX ULTRASOUND OF LIVER TECHNIQUE: Color and duplex Doppler ultrasound was performed to evaluate the hepatic in-flow and out-flow vessels. COMPARISON:  CT 01/12/2021 and previous FINDINGS: Liver: Nodular contour. No focal lesion, mass or intrahepatic biliary ductal dilatation. Main Portal Vein size: 1.4 cm Portal Vein Velocities (all hepatopetal): Main Prox:  36 cm/sec Main Mid: 22 cm/sec Main Dist:  25 cm/sec Right: 15 cm/sec Left: 18 cm/sec Hepatic Vein Velocities (all hepatofugal): Right:  56 cm/sec Middle:  53  cm/sec Left:  64 cm/sec IVC: Present and patent with normal respiratory phasicity. Velocity 22 cm/sec Hepatic Artery Velocity:  59 cm/sec Splenic Vein Velocity:  20 cm/sec Spleen: 13.3 cm x 6.1 cm x 14.9 cm with a total volume of 630 cm^3 (411 cm^3 is upper limit normal) Portal Vein Occlusion/Thrombus: No Splenic Vein Occlusion/Thrombus: No Ascites: Present Varices: None identified IMPRESSION: 1. Negative for hepatic or portal venous thrombosis. 2. Nodular hepatic contour suggesting cirrhosis, without focal lesion. 3. Splenomegaly and ascites suggesting portal venous hypertension. Electronically Signed   By: Lucrezia Europe M.D.   On: 01/14/2021 10:30    Assessment / Plan:  53) 75 year old female with autoimmune hepatitis with decompensated cirrhosis, ascites, esophageal varices and pancytopenia admitted to the hospital 01/12/2021 with generalized abdominal pain, rectal bleeding and melena x 1 day. Started on PPI infusion and Octreotide infusion. Admission Hg 10.1 (baseline Hg 9 - 10.07) -> Hg 7.8 -> 6.7 on 12/21 transfused 1 unit of PRBCs -> today Hg 9.0. CTAP wo contrast showed cirrhosis with marked ascites. S/P paracentesis 12/19, 3.5 L of peritoneal fluid removed.  No SBP. No overt hepatic encephalopathy.  Off Prograf since 11/06/2020. T. Bili 1.6 -> 2.0.  Stable AST/ALT levels. Last IgG level was 3,077 on 12/15. MELD 27, MELD Na 31 calculated per labs today. Patient is not a liver transplant candidate. S/P EGD 12/21 showed grade II esophageal varices without stigmata of recent bleeding, gastritis and portal hypertensive gastropathy. Gastric biopsies pending. Octreotide dc'd. Flex sig 12/20 showed brown stool in the rectum, sigmoid, descending and transverse colon, multiple nonbleeding polyps were seen in the sigmoid, descending and transverse colon (not removed as she is at risk for bleeding) and diverticulosis was present throughout the examined colon.  She passed a nonbloody brown BM this am. Liver doppler  12/21 showed ascites, no evidence of hepatic or portal vein thrombosis.  BP 94/57. Afebrile.  -Blood cultures x 2 to rule out bacteremia in setting of neutropenia and soft BP -Transfuse for Hg level < 7.0 -CBC in am -No plans for further endoscopic evaluation at this time as she is not actively GI bleeding -Continue Cipro 400mg  IV bid for prophylaxis  -Continue PPI po bid -Paracentesis today, peritoneal fluid to include cell count with differential, gram stain aerobic and anaerobic cultures and cytology -2 Gm low sodium diet as tolerated  -Pain management per the hospitalist -If no evidence of SBP to consider low-dose Prednisone for autoimmune hepatitis, she was previously on Prograf without remission, off Prograf x 2 months. I contacted hepatology specialist Roosevelt Locks NP at [facility] who saw the patient at the liver transplant clinic 05/2020, she recommended Prednisone 30mg  Qday with a taper if no infectious process present. She re-confirmed the patient is not a transplant candidate.  -Await further recommendations from Dr. Lorenso Courier   2) Pancytopenia. WBC 1.0. Hg 7.8 -> 6.7 -> transfused 1 unit of PRBCs 12.21 -> Hg 9.0.  PLT 37 -> transfused 1 pack of platelets 12/20 prior EGD/flex sig -> today PLT count 56. Iron 45. B12 666.  CTAP without evidence of splenomegaly. Seen by hematology, pancytopenia likely due to  her autoimmune hepatitis superimposed by bone marrow suppression her current illness. To consider G-CSF if neutrophil count does not improve in 48 hrs.  -Appreciate hematology recommendation   3) Coagulopathy secondary to cirrhosis. INR 2.4 -> 3.4.  -Daily INR -? Vitamin K   4) GERD, history of gastric ulcers -See plan in # 1   5) CKD. Cr. 1.22 -> 1.31 ->1.60. Rising creatinine concerning for developing HRS -Urine  sodium level ordered -Recommend nephrology consult   6) Hyponatremia. Na+ 128 due to cirrhosis (not on diuretics)   7) History of adenomatous and tubulovillous  colon polyps -Follow up colonoscopy as an outpatient    8) History of a stable pancreatic tail cyst     Principal Problem:   Ascites of liver Active Problems:   Pancytopenia (HCC)   Hypothyroidism   Autoimmune hepatitis (HCC)   Esophageal varices determined by endoscopy ([place])   Hyponatremia   Chronic blood loss anemia   Abdominal ascites   Abdominal distension   Anemia associated with chemotherapy   Elevated serum creatinine     LOS: 2 days   Noralyn Pick  01/15/2021, 11:17AM

## 2021-01-15 NOTE — Progress Notes (Signed)
°  Progress Note   Patient: Stacey Velazquez DOB: 1945/03/24 DOA: 01/12/2021     2 DOS: the patient was seen and examined on 01/15/2021   Brief hospital course: Stacey Velazquez is a 75 y.o. F with cirrhosis due to AIH, SSS s/p PPM, CAD s/p remote PCI, CVA without residuals, CKD who presented with rectal bleeding, melena, and abdominal distension/BLE swelling. She was found to have a Hb drop from 10 > 7.    12/19: Admitted, underwent paracentesis 3.5L fluid 12/20: EGD colon showed varices, gastritis but no sitgmata of recent bleeding, flex sig with stool, but no source identified, no melena in colon 12/21: Hgb down to 6.7 g/dL, transfused 1 unit; Liver doppler negative for thrombosis 12/22: Repeat paracentesis  Assessment and Plan Autoimmune hepatitis ([place])- (present on admission) -Continue cipro, PPI, midodrine, albumin  Pancytopenia ([place]) - Consult heme  AKI (acute kidney injury) ([place]) - Start albumin -Continue midodrine - Consult nephrology - Obtain UA and urine electrolytes   Acute metabolic encephalopathy Sluggish and confused yesterday, improved today after transfusion and bowel movement.  Ascites of liver - Repeat paracentesis   Anemia associated with chemotherapy hgb stable post tranfsusion  Hyponatremia- (present on admission) - Start albumin  Hypothyroidism- (present on admission) -Continue levothyroxine     Subjective: Patient is mentation is much better.  She is complaining of some vertigo today.  She has little diplopia as well.  No fever, headache, focal weakness, numbness.  Abdomen is still swollen.  She is able to have some bowel movements today.  Objective Vital signs reviewed and remarkable for blood pressure which is soft, afebrile. General appearance: Elderly adult female, lying in bed, makes eye contact, interactive, pleasant     HEENT: Mostly edentulous, oropharynx tacky dry, no oral lesions Skin: No suspicious rashes or lesions, no  icterus Cardiac: RRR, soft systolic murmur, pitting bilateral lower extremity edema Respiratory: Respiratory rate and rhythm, lungs clear without rales or wheezes Abdomen: Abdomen distended, more tense than yesterday, no tenderness palpation  MSK:  Neuro: Awake and alert, no nystagmus, upper extremity strength symmetric, finger-nose testing normal, speech fluid Psych: Attention normal, affect normal, judgment Syprine normal   Data Reviewed: Review of labs is notable for INR down to 3.2, ammonia 53 which is stable from previous, creatinine up to 1.6 from 1.3, sodium down to 128, and hemoglobin stable  Family Communication: Called to daughter, no answer  Disposition: Status is: Inpatient  Remains inpatient appropriate because: She will require ongoing IV antibiotics, IV albumin, and IV vitamin K          AuthorEdwin Dada 01/15/2021 5:44 PM  For on call review [email or web address].

## 2021-01-15 NOTE — Progress Notes (Signed)
HEMATOLOGY-ONCOLOGY PROGRESS NOTE  SUBJECTIVE: Patient reports to be feeling somewhat better today.  She was working with physical therapy and had started to feel stronger.  No hematemesis or melena.    OBJECTIVE: REVIEW OF SYSTEMS:   Generalized weakness   PHYSICAL EXAMINATION: ECOG PERFORMANCE STATUS: 1 - Symptomatic but completely ambulatory  Vitals:   01/15/21 0401 01/15/21 1352  BP: (!) 94/57 116/69  Pulse: 69 65  Resp: 16 16  Temp: 97.8 F (36.6 C) 97.7 F (36.5 C)  SpO2: 98% 100%   Filed Weights   01/12/21 1147 01/14/21 0655  Weight: 177 lb 0.5 oz (80.3 kg) 189 lb 6 oz (85.9 kg)     LABORATORY DATA:  I have reviewed the data as listed CMP Latest Ref Rng & Units 01/15/2021 01/14/2021 01/13/2021  Glucose 70 - 99 mg/dL 112(H) 94 94  BUN 8 - 23 mg/dL 16 18 16   Creatinine 0.44 - 1.00 mg/dL 1.60(H) 1.31(H) 1.22(H)  Sodium 135 - 145 mmol/L 128(L) 131(L) 132(L)  Potassium 3.5 - 5.1 mmol/L 3.7 3.8 4.2  Chloride 98 - 111 mmol/L 103 105 106  CO2 22 - 32 mmol/L 21(L) 20(L) 20(L)  Calcium 8.9 - 10.3 mg/dL 7.7(L) 7.9(L) 7.7(L)  Total Protein 6.5 - 8.1 g/dL 5.1(L) 5.3(L) 4.6(L)  Total Bilirubin 0.3 - 1.2 mg/dL 2.0(H) 1.8(H) 1.6(H)  Alkaline Phos 38 - 126 U/L 64 52 72  AST 15 - 41 U/L 47(H) 38 40  ALT 0 - 44 U/L 25 21 24     Lab Results  Component Value Date   WBC 1.1 (LL) 01/15/2021   HGB 9.0 (L) 01/15/2021   HCT 25.9 (L) 01/15/2021   MCV 88.1 01/15/2021   PLT 56 (L) 01/15/2021   NEUTROABS 0.5 (L) 01/15/2021    ASSESSMENT AND PLAN: 1.  Pancytopenia Status post blood transfusion with improvement in hemoglobin levels to 9.0 Platelets are stable at 56 Neutropenia: ANC 0.5: Granix injections being given daily. Will stop Granix injections once ANC crosses 1.0.  Will follow along

## 2021-01-15 NOTE — Assessment & Plan Note (Addendum)
It seems her baseline Cr has been in the 0.9 mg/dL range, wtihin the last 6 months, has fluctuated a lot recently.  Was recently up >2 mg/dL in setting of tacrolimus, more recently down to 1.2-1.3 here, and as high as 1.6 mg/dL.  HRS ruled out, nephrology consulted.  Cr better at 1.4 today.  Urine sodium low again. Given her liver disease 1.0-1.5 is probably within the range of her normal.    - Continue midodrine - Nephrology follow up needed at d/c

## 2021-01-15 NOTE — Assessment & Plan Note (Addendum)
-  Continue Fluid restriction

## 2021-01-15 NOTE — Progress Notes (Addendum)
Pt has not urinated since foley removed around noon. Bladder scan showed 386 ml. Pt up to [facility] and had a large BM, urinated 200 ml so far. MD Danford made aware. Will pass to next shift for continued monitoring. Pt has had 3 BMs this shift, all brown with no blood, soft but formed.

## 2021-01-15 NOTE — Assessment & Plan Note (Addendum)
MELD 27 Completed 3d vitamin K, 7d Cipro for SBP PPx  Discussed with Palliative Care today  - Continue prednisone 20, plan for no taper at d/c - Continue PPI, midodrine  - Hold diuretics until Cr trend clearer  - Consult GI, appreciate cares - Consult Palliative Care

## 2021-01-15 NOTE — Assessment & Plan Note (Addendum)
Patient noted to have sluggishness, decreased responsiveness earlier in hospital stay.  This resolved with albumin, paracentesis and starting steroids.  Now at baseline.

## 2021-01-15 NOTE — Assessment & Plan Note (Deleted)
Hgb no change 

## 2021-01-15 NOTE — Consult Note (Signed)
Reason for Consult:AKI/CKD stage IIIa Referring Physician: Loleta Books, MD  Stacey Velazquez is an 75 y.o. female with a extensive PMH most notable for autoimmune hepatitis with cirrhosis, sick sinus syndrome s/p PPM, CAD, h/o CVA, PUD, GERD, glaucoma, bipolar disorder, hypothyroidism, and CKD Stage IIIa who presented to [facility] ED with a 1 week history of lower extremity edema, increased abdominal girth, abdominal pain, and melena.  She also complained of fatigue and malaise.  In the ED her SpO2 was 90% on room air otherwise VSS.  Labs were notable for WBC 2.5, Hgb 10.1, plt 51, INR 2.4, Na 131, BUN 18, Cr 1.22, T bili 1.6, alb 2.1.  Normal LFT's.  She was admitted for further workup and had an US guided paracentesis of 3.5L on 01/12/21.  Liver doppler negative for hepatic or protal venous thrombosis.  EGD performed on 01/13/21 were notable for Grade II esophageal varices, gastritis, portal hypertensive gastropathy, normal duodenum.  Flex sig was performed with diverticulosis and multiple non-bleeding polyps and internal hemorrhoids. Octreotide was started upon admission but stopped yesterday.  GI following.  We were consulted after she developed AKI/CKD stage IIIa.  The trend in Scr is seen below.  She has not received any nephrotoxic agents since admission nor been started on diuretics.  Trend in Creatinine: Creatinine, Ser  Date/Time Value Ref Range Status  01/15/2021 04:07 AM 1.60 (H) 0.44 - 1.00 mg/dL Final  01/14/2021 04:15 AM 1.31 (H) 0.44 - 1.00 mg/dL Final  01/13/2021 04:23 AM 1.22 (H) 0.44 - 1.00 mg/dL Final  01/12/2021 11:53 AM 1.22 (H) 0.44 - 1.00 mg/dL Final  01/08/2021 10:12 AM 1.18 0.40 - 1.20 mg/dL Final  12/08/2020 10:11 AM 1.10 0.40 - 1.20 mg/dL Final  11/27/2020 10:47 AM 1.31 (H) 0.40 - 1.20 mg/dL Final  11/06/2020 11:14 AM 2.03 (H) 0.40 - 1.20 mg/dL Final  11/04/2020 02:38 PM 2.01 (H) 0.40 - 1.20 mg/dL Final  10/28/2020 11:28 AM 2.29 (H) 0.40 - 1.20 mg/dL Final  10/02/2020 11:01 AM  2.18 (H) 0.40 - 1.20 mg/dL Final  06/20/2020 10:55 AM 1.68 (H) 0.44 - 1.00 mg/dL Final  04/21/2020 11:03 AM 1.06 0.40 - 1.20 mg/dL Final  03/27/2020 10:47 AM 1.06 0.40 - 1.20 mg/dL Final  03/24/2020 12:00 PM 0.95 0.40 - 1.20 mg/dL Final  02/28/2020 10:39 AM 0.73 0.40 - 1.20 mg/dL Final  02/14/2020 12:14 PM 0.63 0.40 - 1.20 mg/dL Final  01/07/2020 10:51 AM 0.61 0.40 - 1.20 mg/dL Final  12/06/2019 10:17 AM 0.69 0.40 - 1.20 mg/dL Final  11/02/2019 10:34 AM 0.67 0.40 - 1.20 mg/dL Final  09/18/2019 10:41 AM 0.57 0.40 - 1.20 mg/dL Final  08/17/2019 10:48 AM 0.57 0.40 - 1.20 mg/dL Final  07/19/2019 02:27 PM 0.54 0.40 - 1.20 mg/dL Final  06/21/2019 11:00 AM 0.54 0.40 - 1.20 mg/dL Final  06/05/2019 04:24 PM 0.57 0.44 - 1.00 mg/dL Final  05/11/2019 04:46 AM 0.66 0.44 - 1.00 mg/dL Final  05/08/2019 03:57 PM 0.66 0.44 - 1.00 mg/dL Final  05/01/2019 02:42 PM 0.59 0.40 - 1.20 mg/dL Final  03/24/2019 06:37 PM 0.73 0.44 - 1.00 mg/dL Final  03/05/2019 01:38 PM 0.80 0.44 - 1.00 mg/dL Final  02/27/2019 03:12 PM 0.71 0.40 - 1.20 mg/dL Final  05/16/2017 07:48 PM 1.15 (H) 0.44 - 1.00 mg/dL Final  05/12/2017 01:31 AM 1.01 (H) 0.44 - 1.00 mg/dL Final  04/18/2017 02:29 PM 0.79 0.57 - 1.00 mg/dL Final  10/11/2016 11:27 AM 0.79 0.57 - 1.00 mg/dL Final  08/25/2014 06:05 AM 0.64 0.44 -  1.00 mg/dL Final  08/24/2014 05:13 AM 0.65 0.44 - 1.00 mg/dL Final  08/23/2014 02:32 AM 0.63 0.44 - 1.00 mg/dL Final  08/22/2014 02:05 AM 0.67 0.44 - 1.00 mg/dL Final  08/21/2014 11:00 AM 0.79 0.44 - 1.00 mg/dL Final  05/10/2014 03:21 PM 0.68 0.40 - 1.20 mg/dL Final  04/16/2014 02:54 PM 0.66 0.40 - 1.20 mg/dL Final  10/31/2013 09:00 AM 0.53 0.50 - 1.10 mg/dL Final  02/21/2013 11:42 AM 0.70 0.50 - 1.10 mg/dL Final  02/21/2013 11:31 AM 0.58 0.50 - 1.10 mg/dL Final    PMH:   Past Medical History:  Diagnosis Date   Adenomatous polyp of ascending colon    Anxiety    Bilateral lower extremity edema    burning sensations    Bipolar 1 disorder (HCC)    Carpal tunnel syndrome    Cirrhosis of liver (HCC)    CKD (chronic kidney disease) stage 4, GFR 15-29 ml/min ([place]) 11/04/2020   Cutaneous horn    Drug-induced parkinsonism ([place])    patient unaware   Esophageal varices ([place])    Fibromyalgia    patient denies   Gastric ulcer    GERD (gastroesophageal reflux disease)    Gingival abscess 08/26/2016   Glaucoma 2003   pt unaware   Heart attack ([place])    Heart disease    Hepatitis, autoimmune ([place]) 08/25/2014   History of iron deficiency anemia    Hypotension    Hypothyroidism    Migraines    Neutropenia ([place])    Osteoporosis    Pacemaker Medtronic    MRI compatible   Paranoid schizophrenia ([place])    Polyarthritis    Polyosteoarthritis    Rectal mass    Seizures ([place])    last sz 08/21/14   Sinus arrest    Skin cancer    forehead   SSS (sick sinus syndrome) ([place])    Stroke ([place])    Thrombocytopenia ([place])    Vertigo    bvvp    PSH:   Past Surgical History:  Procedure Laterality Date   BIOPSY  03/06/2019   Procedure: BIOPSY;  Surgeon: Thornton Park, MD;  Location: [facility];  Service: Gastroenterology;;  EGD and COLON   BIOPSY  04/15/2020   Procedure: BIOPSY;  Surgeon: Thornton Park, MD;  Location: [facility];  Service: Gastroenterology;;   BIOPSY  01/13/2021   Procedure: BIOPSY;  Surgeon: Sharyn Creamer, MD;  Location: [facility];  Service: Gastroenterology;;   CATARACT EXTRACTION Left 2008   pt unaware   CHOLECYSTECTOMY     COLONOSCOPY WITH PROPOFOL N/A 03/06/2019   Procedure: COLONOSCOPY WITH PROPOFOL;  Surgeon: Thornton Park, MD;  Location: [facility];  Service: Gastroenterology;  Laterality: N/A;   ESOPHAGOGASTRODUODENOSCOPY N/A 01/13/2021   Procedure: ESOPHAGOGASTRODUODENOSCOPY (EGD);  Surgeon: Sharyn Creamer, MD;  Location: [facility];  Service: Gastroenterology;  Laterality: N/A;   ESOPHAGOGASTRODUODENOSCOPY (EGD) WITH PROPOFOL N/A 03/06/2019   Procedure:  ESOPHAGOGASTRODUODENOSCOPY (EGD) WITH PROPOFOL ;  Surgeon: Thornton Park, MD;  Location: [facility];  Service: Gastroenterology;  Laterality: N/A;   ESOPHAGOGASTRODUODENOSCOPY (EGD) WITH PROPOFOL N/A 04/15/2020   Procedure: ESOPHAGOGASTRODUODENOSCOPY (EGD) WITH PROPOFOL;  Surgeon: Thornton Park, MD;  Location: [facility];  Service: Gastroenterology;  Laterality: N/A;   FLEXIBLE SIGMOIDOSCOPY N/A 01/13/2021   Procedure: FLEXIBLE SIGMOIDOSCOPY;  Surgeon: Sharyn Creamer, MD;  Location: [facility];  Service: Gastroenterology;  Laterality: N/A;   KNEE SURGERY Left 1985   PACEMAKER INSERTION  2014   PARTIAL HYSTERECTOMY  1979   PARTIAL PROCTECTOMY BY  TEM N/A 05/10/2019   Procedure: TEM PARTIAL PROCTECTOMY OF RECTAL MASS WITH EXCISION RIGHT PERINEAL SKIN MASS;  Surgeon: Michael Boston, MD;  Location: [facility];  Service: General;  Laterality: N/A;   POLYPECTOMY  03/06/2019   Procedure: POLYPECTOMY;  Surgeon: Thornton Park, MD;  Location: [facility];  Service: Gastroenterology;;   REFRACTIVE SURGERY     TUBAL LIGATION      Allergies:  Allergies  Allergen Reactions   Misc. Throat Products Other (See Comments)    PT states that she is allergic to OTC  Flu, cold, sinus, allergy, gas, and GERD meds   Neomycin Nausea And Vomiting   Oxycodone-Acetaminophen Rash   Ambien [Zolpidem Tartrate] Other (See Comments)    Hallucinations    Amitriptyline Other (See Comments)    Stops heart per pt   Ampicillin Other (See Comments)    Stops heart per pt    Anaprox [Naproxen Sodium] Other (See Comments)    Doesn't work per pt   Benadryl [Diphenhydramine Hcl (Sleep)] Other (See Comments)    Rash   Cetirizine & Related Other (See Comments)    Swelling    Cortizone-10 [Hydrocortisone] Other (See Comments)    seizures    Darvon [Propoxyphene] Other (See Comments)    Increased heart rate per pt    Diazepam Other (See Comments)    Stops heart per pt   Diflunisal Swelling and Other (See  Comments)    Stops heart per pt (Dolobid)   Duloxetine Other (See Comments)    Reaction to Cymbalta - pt doesn't remember what the reaction was   Flexeril [Cyclobenzaprine] Other (See Comments)    Seizures    Lactose Intolerance (Gi) Swelling and Other (See Comments)    cramping   Lidocaine Other (See Comments)    Seizures   Meperidine And Related Other (See Comments)    Stops heart rate (reaction to Demerol)   Metanx [L-Methylfolate-Algae-B12-B6] Other (See Comments)    Pt does not remember reaction   Metoclopramide Other (See Comments)    Pt does not remember reaction   Morphine And Related Other (See Comments)    Stops heart per pt    Nuprin [Ibuprofen] Other (See Comments)    Caused headache   Oxycodone Other (See Comments)    Hallucinations    Penicillins Other (See Comments)    Stops heart per pt    Propoxyphene Other (See Comments)    Slowed heart rate per pt (reaction to Darvocet)   Ranitidine Other (See Comments)    Pt does not remember reaction   Vicodin [Hydrocodone-Acetaminophen] Other (See Comments)    Seizures    Butenafine Hcl Rash   Tramadol Other (See Comments)   Tylenol [Acetaminophen] Rash   Zolpidem Other (See Comments) and Rash    Hallucinations     Medications:   Prior to Admission medications   Medication Sig Start Date End Date Taking? Authorizing Provider  dexlansoprazole (DEXILANT) 60 MG capsule Take 1 capsule (60 mg total) by mouth daily. 05/08/20  Yes Gatha Mayer, MD  famotidine (PEPCID) 20 MG tablet Take 1 tablet (20 mg total) by mouth 2 (two) times daily. 12/12/20  Yes Thornton Park, MD  levothyroxine (SYNTHROID) 50 MCG tablet Take 1 tablet (50 mcg total) by mouth daily. 12/17/20  Yes Libby Maw, MD  polyethylene glycol (MIRALAX / GLYCOLAX) 17 g packet Take 8.5 g by mouth daily as needed for severe constipation.   Yes [provider]  Probiotic Product (DIGESTIVE ADV DIGESTIVE/IMMUNE)  CHEW Chew 2 capsules by  mouth at bedtime.   Yes [provider]  Skin Protectants, Misc. (DRY SKIN EX) Apply 1 application topically daily. Apply to both elbows, knee caps, and feet   Yes [provider]  tamsulosin (FLOMAX) 0.4 MG CAPS capsule Take 0.4 mg by mouth at bedtime. 07/16/19  Yes [provider]  furosemide (LASIX) 20 MG tablet Take 20 mg by mouth daily. Patient not taking: Reported on 01/12/2021    [provider]  linaclotide Rolan Lipa) 145 MCG CAPS capsule Take 1 capsule (145 mcg total) by mouth daily before breakfast. Patient not taking: Reported on 01/12/2021 09/25/20   Thornton Park, MD  spironolactone (ALDACTONE) 25 MG tablet TAKE 1/2 TABLET (12.5 MG TOTAL) BY MOUTH DAILY. Patient not taking: Reported on 01/12/2021 06/18/20   Haydee Salter, MD  tacrolimus (PROGRAF) 1 MG capsule Take 2 capsules (2 mg total) by mouth daily. ON HOLD D/T RENAL FUNCTION Patient not taking: Reported on 01/12/2021 11/06/20   Thornton Park, MD    Inpatient medications:  (feeding supplement) PROSource Plus  30 mL Oral BID BM   sodium chloride   Intravenous Once   Chlorhexidine Gluconate Cloth  6 each Topical Daily   feeding supplement  237 mL Oral Q24H   fentaNYL (SUBLIMAZE) injection  50 mcg Intravenous Once   levothyroxine  50 mcg Oral Daily   mouth rinse  15 mL Mouth Rinse BID   midodrine  5 mg Oral BID WC   pantoprazole  40 mg Oral BID   polyethylene glycol  17 g Oral QHS   tamsulosin  0.4 mg Oral QHS   Tbo-filgastrim (GRANIX) SQ  480 mcg Subcutaneous q1800    Discontinued Meds:   Medications Discontinued During This Encounter  Medication Reason   albumin human 25 % solution 50 g    tetracaine 1 % injection 0.5 mL    0.9 %  sodium chloride infusion    lactated ringers infusion    octreotide (SANDOSTATIN) 500 mcg in sodium chloride 0.9 % 250 mL (2 mcg/mL) infusion    0.9 %  sodium chloride infusion Patient Transfer   lactated ringers infusion Patient Transfer    pantoprozole (PROTONIX) 80 mg /NS 100 mL infusion    pantoprazole (PROTONIX) injection 40 mg    albumin human 25 % solution 25 g     Social History:  reports that she quit smoking about 17 years ago. Her smoking use included cigarettes. She has never used smokeless tobacco. She reports that she does not drink alcohol and does not use drugs.  Family History:   Family History  Problem Relation Age of Onset   Heart attack Father    Heart disease Paternal Uncle    Heart disease Paternal Grandmother    Diabetes Paternal Grandmother    Heart disease Paternal Grandfather    Breast cancer Mother    Cancer Maternal Grandmother    CVA Maternal Grandmother    Cancer Maternal Grandfather    CVA Maternal Grandfather    Diabetes Sister    Diabetes Brother    Diabetes Brother    Stroke Daughter     Pertinent items are noted in HPI. Weight change:   Intake/Output Summary (Last 24 hours) at 01/15/2021 1241 Last data filed at 01/15/2021 1209 Gross per 24 hour  Intake 740 ml  Output 1050 ml  Net -310 ml   BP (!) 94/57 (BP Location: Left Arm)    Pulse 69    Temp 97.8  F (36.6 C) (Oral)    Resp 16    Ht 5\' 9"  (1.753 m)    Wt 85.9 kg    SpO2 98%    BMI 27.97 kg/m  Vitals:   01/14/21 1446 01/14/21 1525 01/14/21 1932 01/15/21 0401  BP: 106/69 111/78 121/73 (!) 94/57  Pulse: 64  70 69  Resp: 18  20 16   Temp: 98.4 F (36.9 C)  98.2 F (36.8 C) 97.8 F (36.6 C)  TempSrc:   Axillary Oral  SpO2: 99%  99% 98%  Weight:      Height:         General appearance: alert, cooperative, and no distress Head: Normocephalic, without obvious abnormality, atraumatic Resp: clear to auscultation bilaterally Cardio: regular rate and rhythm, S1, S2 normal, no murmur, click, rub or gallop GI: distended, +BS, soft, NT, no guarding or rebound Extremities: edema 1+ bilateral pedal and pretibial edema, no presacral edema  Labs: Basic Metabolic Panel: Recent Labs  Lab 01/12/21 1153 01/13/21 0423  01/14/21 0415 01/15/21 0407  NA 131* 132* 131* 128*  K 4.1 4.2 3.8 3.7  CL 106 106 105 103  CO2 22 20* 20* 21*  GLUCOSE 99 94 94 112*  BUN 18 16 18 16   CREATININE 1.22* 1.22* 1.31* 1.60*  ALBUMIN 2.1* 2.1* 3.4* 2.9*  CALCIUM 7.8* 7.7* 7.9* 7.7*  PHOS  --   --  3.7  --    Liver Function Tests: Recent Labs  Lab 01/13/21 0423 01/14/21 0415 01/15/21 0407  AST 40 38 47*  ALT 24 21 25   ALKPHOS 72 52 64  BILITOT 1.6* 1.8* 2.0*  PROT 4.6* 5.3* 5.1*  ALBUMIN 2.1* 3.4* 2.9*   Recent Labs  Lab 01/12/21 1153  LIPASE 29   Recent Labs  Lab 01/12/21 1208 01/15/21 0407  AMMONIA 38* 53*   CBC: Recent Labs  Lab 01/12/21 1153 01/13/21 0423 01/13/21 1233 01/14/21 0415 01/14/21 1221 01/15/21 0407  WBC 2.5*   < > 1.7* 1.0* 1.2* 1.1*  NEUTROABS 1.7  --   --  0.5* 0.6* 0.5*  HGB 10.1*   < > 8.1* 6.7* 8.4* 9.0*  HCT 29.6*   < > 24.0* 19.5* 24.3* 25.9*  MCV 91.1   < > 90.9 91.1 89.3 88.1  PLT 51*   < > 55* 50* 53* 56*   < > = values in this interval not displayed.   PT/INR: @LABRCNTIP (inr:5) Cardiac Enzymes: )No results for input(s): CKTOTAL, CKMB, CKMBINDEX, TROPONINI in the last 168 hours. CBG: No results for input(s): GLUCAP in the last 168 hours.  Iron Studies:  Recent Labs  Lab 01/14/21 1221  IRON 45  TIBC NOT CALCULATED  FERRITIN 168    Xrays/Other Studies: DG Abd Portable 1V  Result Date: 01/14/2021 CLINICAL DATA:  Abdominal pain and distension. History of cirrhosis with ascites. EXAM: PORTABLE ABDOMEN - 1 VIEW COMPARISON:  Radiographs 08/21/2014.  CT 01/12/2021. FINDINGS: 1455 hours. The bowel gas pattern is nonobstructive. There is central displacement of the bowel consistent with known underlying ascites. Mild bowel wall thickening, similar to recent CT and likely related to ascites/liver disease as well. No supine evidence of free intraperitoneal air. Surgical clips are present related to prior cholecystectomy with a stable surgical clip in the right  pelvis. Advanced asymmetric left hip osteoarthritis noted. IMPRESSION: No evidence of acute abdominal process. Known findings related to previously demonstrated ascites. Electronically Signed   By: Richardean Sale M.D.   On: 01/14/2021 15:08   US LIVER DOPPLER  Result Date: 01/14/2021 CLINICAL DATA:  Ascites, rule out portal or hepatic venous thrombosis EXAM: DUPLEX ULTRASOUND OF LIVER TECHNIQUE: Color and duplex Doppler ultrasound was performed to evaluate the hepatic in-flow and out-flow vessels. COMPARISON:  CT 01/12/2021 and previous FINDINGS: Liver: Nodular contour. No focal lesion, mass or intrahepatic biliary ductal dilatation. Main Portal Vein size: 1.4 cm Portal Vein Velocities (all hepatopetal): Main Prox:  36 cm/sec Main Mid: 22 cm/sec Main Dist:  25 cm/sec Right: 15 cm/sec Left: 18 cm/sec Hepatic Vein Velocities (all hepatofugal): Right:  56 cm/sec Middle:  53 cm/sec Left:  64 cm/sec IVC: Present and patent with normal respiratory phasicity. Velocity 22 cm/sec Hepatic Artery Velocity:  59 cm/sec Splenic Vein Velocity:  20 cm/sec Spleen: 13.3 cm x 6.1 cm x 14.9 cm with a total volume of 630 cm^3 (411 cm^3 is upper limit normal) Portal Vein Occlusion/Thrombus: No Splenic Vein Occlusion/Thrombus: No Ascites: Present Varices: None identified IMPRESSION: 1. Negative for hepatic or portal venous thrombosis. 2. Nodular hepatic contour suggesting cirrhosis, without focal lesion. 3. Splenomegaly and ascites suggesting portal venous hypertension. Electronically Signed   By: Lucrezia Europe M.D.   On: 01/14/2021 10:30     Assessment/Plan:  AKI/CKD stage IIIa, non-oliguric - likely hemodynamically mediated with decompensated cirrhosis with worsening ascites s/p paracentesis, and GI bleed.  Also on the DDx would be abdominal compartment syndrome (no UOP prior to paracentesis per pt report).  Urine Na 41 so not consistent with HRS.  Volume has improved since admission and for another paracentesis today.  Recommend  administration of IV albumin with fluid removal and limit to no more than 4 liters to prevent further injury to the kidneys. Renal dose meds Avoid nephrotoxic agents such as IV contrast, NSAIDs, phosphate containing bowel preps (FLEETS) Limit volume of paracentesis to 4 liters Continue with IV albumin No indication for dialysis at this time and will continue to follow.  Hyponatremia, hypervolemic - due to ascites/decompensated cirrhosis.  Continue with IV albumin and follow after paracentesis.  Hold off on diuretics for now given rising Scr. Decompensated cirrhosis, ascites, esophageal varices, and pancytopenia - GI following.  No SBP but history of melena but no active bleeding found on EGD or flex sig. Blood and urine cultures ordered for today. GI following. Autoimmune hepatitis - off of prograf for 2 months and to start prednisone per GI Pancytopenia - Hematology following. GERD with gastric ulcers - per GI   Governor [place] Francesca Strome 01/15/2021, 12:41 PM

## 2021-01-15 NOTE — Assessment & Plan Note (Addendum)
Iron replete now  -Continue ppi - Check iron studies in 2 months

## 2021-01-15 NOTE — Progress Notes (Signed)
Brief oncology note:  CBC from this morning has been reviewed.  Platelet count is stable to slightly improved, hemoglobin improved to 9.0, WBC and ANC remain low.  We will initiate Granix 480 mcg subcu daily until ANC is 1.0 or higher.  CBC    Component Value Date/Time   WBC 1.1 (LL) 01/15/2021 0407   RBC 2.94 (L) 01/15/2021 0407   HGB 9.0 (L) 01/15/2021 0407   HGB 9.0 (L) 12/26/2020 0917   HGB 15.2 04/18/2017 1429   HCT 25.9 (L) 01/15/2021 0407   HCT 44.8 04/18/2017 1429   PLT 56 (L) 01/15/2021 0407   PLT 56 (L) 12/26/2020 0917   PLT 114 (L) 04/18/2017 1429   MCV 88.1 01/15/2021 0407   MCV 95 04/18/2017 1429   MCH 30.6 01/15/2021 0407   MCHC 34.7 01/15/2021 0407   RDW 14.6 01/15/2021 0407   RDW 15.8 (H) 04/18/2017 1429   LYMPHSABS 0.4 (L) 01/15/2021 0407   LYMPHSABS 0.6 (L) 04/18/2017 1429   MONOABS 0.2 01/15/2021 0407   EOSABS 0.0 01/15/2021 0407   EOSABS 0.0 04/18/2017 1429   BASOSABS 0.0 01/15/2021 0407   BASOSABS 0.0 04/18/2017 1429   We will continue to follow.  Mikey Bussing, DNP, AGPCNP-BC, AOCNP

## 2021-01-15 NOTE — TOC Progression Note (Signed)
Transition of Care [facility]) - Progression Note    Patient Details  Name: Stacey Velazquez MRN: [id number] Date of Birth: 08/28/1945  Transition of Care [facility]) CM/SW Contact  Purcell Mouton, RN Phone Number: 01/15/2021, 3:46 PM  Clinical Narrative:    Spoke with pt concerning [place]. Pt selected [facility] for HHPT.    Expected Discharge Plan: [place] Barriers to Discharge: No Barriers Identified  Expected Discharge Plan and Services Expected Discharge Plan: [place] arrangements for the past 2 months: Single Family Home                                       Social Determinants of Health (SDOH) Interventions    Readmission Risk Interventions No flowsheet data found.

## 2021-01-15 NOTE — Assessment & Plan Note (Signed)
CKD IV ruled out.  Cr variable recently.    - Trend Cr

## 2021-01-15 NOTE — Assessment & Plan Note (Signed)
-   Repeat paracentesis

## 2021-01-15 NOTE — Procedures (Signed)
PROCEDURE SUMMARY:  Successful US guided paracentesis from left abdomen.  Yielded 3.5 L of clear yellow fluid.  No immediate complications.  Pt tolerated well.   Specimen sent for labs.  EBL < 2 mL  Theresa Duty, NP 01/15/2021 2:21 PM

## 2021-01-15 NOTE — Assessment & Plan Note (Addendum)
Multifactorial.  ANC down to <500 at one point.  Heme consulted, Granix given, ANC recovered. Neutropenia largely sequestration, drug induced, BM failure.  Anemia largely chronic blood loss.   Thrombocytopenia largely sequestration.

## 2021-01-15 NOTE — Assessment & Plan Note (Signed)
Continue levothyroxine 

## 2021-01-15 NOTE — Evaluation (Signed)
Physical Therapy Evaluation Patient Details Name: Stacey Velazquez MRN: [id number] DOB: [birth year] Today's Date: 01/15/2021  History of Present Illness  Pt is a 75 y.o. female who presented to [facility] ED with progressively worsening abdominal distention for the past month associated with worsening dyspnea, lower extremity edema and abdominal pain that excalated. Associated with few days of melena and hematochezia.  No nausea or vomiting. Paracentisis done on 01/12/2021 with 3.5 L of fluid removed by IR.  Abdominal fluid not suggestive of SBP. PMH significant for  autoimmune hepatitis,  with abdominal ascites, anemia of chronic disease, colon polyps, anxiety, bilateral lower extremity edema, bipolar disorder with insomnia, cutaneous horn, drug-induced parkinsonism, esophageal varices, fibromyalgia, gastric ulcer, GERD, gingival abscess, glaucoma, CAD, history of MI, hypothyroidism, migraine headaches, neutropenia, osteoporosis, paranoid schizophrenia, polyarthritis, rectal mass, seizure disorder, sinus arrest, sick sinus syndrome, pacemaker placement, history of nonhemorrhagic stroke, thrombocytopenia   Clinical Impression  Pt is a 75 y.o . Female with above HPI resulting in the deficits listed below (see PT Problem List). Pt performed bed mobility with MIN-MOD A and heavy use of bed rails with increased time using log roll technique. Pt limited with mobility due to generalized pain, L LE>R and onset of dizziness with supine to sit transfers. Recommend SNF at this time due to increased assist required for performance and safety with all mobility and decreased caregiver support. Pt will benefit from skilled PT to maximize functional mobility to increase independence.         Recommendations for follow up therapy are one component of a multi-disciplinary discharge planning process, led by the attending physician.  Recommendations may be updated based on patient status, additional functional criteria and  insurance authorization.  Follow Up Recommendations Skilled nursing-short term rehab (<3 hours/day)    Assistance Recommended at Discharge Frequent or constant Supervision/Assistance  Functional Status Assessment Patient has had a recent decline in their functional status and demonstrates the ability to make significant improvements in function in a reasonable and predictable amount of time.  Equipment Recommendations       Recommendations for Other Services       Precautions / Restrictions Precautions Precautions: Fall Restrictions Weight Bearing Restrictions: No      Mobility  Bed Mobility Overal bed mobility: Needs Assistance Bed Mobility: Supine to Sit;Sit to Sidelying     Supine to sit: Min assist;Mod assist;HOB elevated   Sit to sidelying: Mod assist;HOB elevated General bed mobility comments: BP in supine 112/47mmHg. significantly increased time, pt demonstrating log roll technique for bed mobility reports learned from [place]. Use of bed rails and requesting HHA from therapist to complete. Reports of dizziness x2 while sitting EOB. "room is spinning and vision gets foggy at times"  resolved with prolonged sitting. Pt reports she has just had issues with dizziness since onset of illness.    Transfers                   General transfer comment: Pt politely declined STS transfers with use of RW despite PT education/demonstration on proper/safe techinque. Reports that she does not feel comfortable/as stable using RW vs. rollator as she is able to lock brakes on rollator at home and would prefer to attempt using rollator.    Ambulation/Gait                  Stairs            Wheelchair Mobility    Modified Rankin (Stroke Patients Only)  Balance Overall balance assessment: Needs assistance Sitting-balance support: Feet supported;Bilateral upper extremity supported Sitting balance-Leahy Scale: Fair                                        Pertinent Vitals/Pain Pain Assessment: Faces Faces Pain Scale: Hurts even more Pain Location: generalized with mobility, reports due to history of OA in multiple joints Pain Descriptors / Indicators: Constant;Grimacing;Sore Pain Intervention(s): Limited activity within patient's tolerance;Monitored during session;Repositioned    Home Living Family/patient expects to be discharged to:: Private residence Living Arrangements: Alone Available Help at Discharge: Friend(s) Type of Home: Apartment (second level)           Home Equipment: Wheelchair - Water quality scientist (4 wheels);BSC/3in1 (grabber) Additional Comments: pt reports she lives in an apartment on the second floor. She is unable to hold door open with mwc or rw to exit apartment,  has friends to assist. has rollator, wc. typically perform stand pivot using rollator. Reports having [place] services previously. daughter lives 2 hrs away    Prior Function Prior Level of Function : Needs assist       Physical Assist : Mobility (physical) Mobility (physical): Transfers   Mobility Comments: use of RW, mwc; pt reports she has spent much time in bed the past month due to acute illness. Sits on EOB to manage bills, take meds, etc. ADLs Comments: pt reports sink baths, uses BSC next to chair, assist for set up with increased time for LB dressing; Has friends that assist with ADLs and IADLs.     Hand Dominance        Extremity/Trunk Assessment   Upper Extremity Assessment Upper Extremity Assessment: Defer to OT evaluation    Lower Extremity Assessment Lower Extremity Assessment: RLE deficits/detail;LLE deficits/detail RLE: Unable to fully assess due to pain LLE: Unable to fully assess due to pain    Cervical / Trunk Assessment Cervical / Trunk Assessment: Normal  Communication   Communication: No difficulties  Cognition Arousal/Alertness: Awake/alert Behavior During Therapy: WFL for tasks assessed/performed;Flat  affect Overall Cognitive Status: No family/caregiver present to determine baseline cognitive functioning                                 General Comments: A&O x4, ?safety awareness, difficulty with mobility while at home living alone, getting in/out of door.        General Comments      Exercises     Assessment/Plan    PT Assessment Patient needs continued PT services  PT Problem List Decreased strength;Decreased range of motion;Decreased activity tolerance;Decreased balance;Decreased mobility;Pain       PT Treatment Interventions DME instruction;Gait training;Functional mobility training;Therapeutic activities;Therapeutic exercise;Wheelchair mobility training;Patient/family education;Balance training    PT Goals (Current goals can be found in the Care Plan section)  Acute Rehab PT Goals Patient Stated Goal: Have patience and be able to improve movement with less dizziness PT Goal Formulation: With patient Time For Goal Achievement: 01/29/21 Potential to Achieve Goals: Good    Frequency Min 2X/week   Barriers to discharge Decreased caregiver support pt lives alone, receives intermittent assist from friends with ADLs, IADLs, transfers    Co-evaluation               AM-PAC PT "6 Clicks" Mobility  Outcome Measure Help needed turning from your back to your side  while in a flat bed without using bedrails?: A Lot Help needed moving from lying on your back to sitting on the side of a flat bed without using bedrails?: A Lot Help needed moving to and from a bed to a chair (including a wheelchair)?: Total Help needed standing up from a chair using your arms (e.g., wheelchair or bedside chair)?: Total Help needed to walk in hospital room?: Total Help needed climbing 3-5 steps with a railing? : Total 6 Click Score: 8    End of Session   Activity Tolerance: Patient tolerated treatment well Patient left: in bed;with call bell/phone within reach (in L  sidelying) Nurse Communication: Mobility status PT Visit Diagnosis: Pain;Other abnormalities of gait and mobility (R26.89) Pain - Right/Left:  (bilateral) Pain - part of body:  (generalized, L LE>R)    Time: 9983-3825 PT Time Calculation (min) (ACUTE ONLY): 31 min   Charges:   PT Evaluation $PT Eval Low Complexity: 1 Low PT Treatments $Therapeutic Activity: 8-22 mins        Festus Barren PT, DPT  Acute Rehabilitation Services  Office [phone]  01/15/2021, 10:08 AM

## 2021-01-16 ENCOUNTER — Encounter (HOSPITAL_COMMUNITY): Payer: Self-pay | Admitting: Internal Medicine

## 2021-01-16 ENCOUNTER — Inpatient Hospital Stay (HOSPITAL_COMMUNITY): Payer: Medicare Other

## 2021-01-16 DIAGNOSIS — K2970 Gastritis, unspecified, without bleeding: Secondary | ICD-10-CM | POA: Diagnosis not present

## 2021-01-16 DIAGNOSIS — K766 Portal hypertension: Secondary | ICD-10-CM | POA: Diagnosis not present

## 2021-01-16 DIAGNOSIS — D125 Benign neoplasm of sigmoid colon: Secondary | ICD-10-CM | POA: Diagnosis not present

## 2021-01-16 LAB — URINE CULTURE

## 2021-01-16 LAB — DIFFERENTIAL
Abs Immature Granulocytes: 0.01 10*3/uL (ref 0.00–0.07)
Basophils Absolute: 0 10*3/uL (ref 0.0–0.1)
Basophils Relative: 0 %
Eosinophils Absolute: 0 10*3/uL (ref 0.0–0.5)
Eosinophils Relative: 1 %
Immature Granulocytes: 0 %
Lymphocytes Relative: 15 %
Lymphs Abs: 0.4 10*3/uL — ABNORMAL LOW (ref 0.7–4.0)
Monocytes Absolute: 0.3 10*3/uL (ref 0.1–1.0)
Monocytes Relative: 10 %
Neutro Abs: 2.1 10*3/uL (ref 1.7–7.7)
Neutrophils Relative %: 74 %

## 2021-01-16 LAB — COMPREHENSIVE METABOLIC PANEL
ALT: 21 U/L (ref 0–44)
AST: 38 U/L (ref 15–41)
Albumin: 2.9 g/dL — ABNORMAL LOW (ref 3.5–5.0)
Alkaline Phosphatase: 55 U/L (ref 38–126)
Anion gap: 9 (ref 5–15)
BUN: 15 mg/dL (ref 8–23)
CO2: 20 mmol/L — ABNORMAL LOW (ref 22–32)
Calcium: 8.1 mg/dL — ABNORMAL LOW (ref 8.9–10.3)
Chloride: 106 mmol/L (ref 98–111)
Creatinine, Ser: 1.41 mg/dL — ABNORMAL HIGH (ref 0.44–1.00)
GFR, Estimated: 39 mL/min — ABNORMAL LOW (ref >60)
Glucose, Bld: 102 mg/dL — ABNORMAL HIGH (ref 70–99)
Potassium: 3.6 mmol/L (ref 3.5–5.1)
Sodium: 135 mmol/L (ref 135–145)
Total Bilirubin: 1.9 mg/dL — ABNORMAL HIGH (ref 0.3–1.2)
Total Protein: 4.9 g/dL — ABNORMAL LOW (ref 6.5–8.1)

## 2021-01-16 LAB — CBC
HCT: 25.8 % — ABNORMAL LOW (ref 36.0–46.0)
Hemoglobin: 8.8 g/dL — ABNORMAL LOW (ref 12.0–15.0)
MCH: 30.8 pg (ref 26.0–34.0)
MCHC: 34.1 g/dL (ref 30.0–36.0)
MCV: 90.2 fL (ref 80.0–100.0)
Platelets: 45 10*3/uL — ABNORMAL LOW (ref 150–400)
RBC: 2.86 MIL/uL — ABNORMAL LOW (ref 3.87–5.11)
RDW: 14.6 % (ref 11.5–15.5)
WBC: 2.8 10*3/uL — ABNORMAL LOW (ref 4.0–10.5)
nRBC: 0 % (ref 0.0–0.2)

## 2021-01-16 LAB — BODY FLUID CULTURE W GRAM STAIN
Culture: NO GROWTH
Gram Stain: NONE SEEN

## 2021-01-16 LAB — GLUCOSE, CAPILLARY: Glucose-Capillary: 139 mg/dL — ABNORMAL HIGH (ref 70–99)

## 2021-01-16 LAB — PROTIME-INR
INR: 3.4 — ABNORMAL HIGH (ref 0.8–1.2)
Prothrombin Time: 34.4 seconds — ABNORMAL HIGH (ref 11.4–15.2)

## 2021-01-16 LAB — MAGNESIUM: Magnesium: 1.8 mg/dL (ref 1.7–2.4)

## 2021-01-16 LAB — PHOSPHORUS: Phosphorus: 2.5 mg/dL (ref 2.5–4.6)

## 2021-01-16 LAB — IGG: IgG (Immunoglobin G), Serum: 1593 mg/dL (ref 586–1602)

## 2021-01-16 IMAGING — CT CT HEAD W/O CM
4 series · 16 of 47 positions shown, 18 images · non-contrast
Comparison: CT head [DATE]

CLINICAL DATA: Dizziness.  Confusion.

EXAM:
CT HEAD WITHOUT CONTRAST
TECHNIQUE: Contiguous axial images were obtained from the base of the skull
through the vertex without intravenous contrast.

[Series 2: head wo · axial · 0.47mm/px · z∈[-67,+48]mm · 7 of 31 slices shown, 9 images]
[im 4/31  brain]
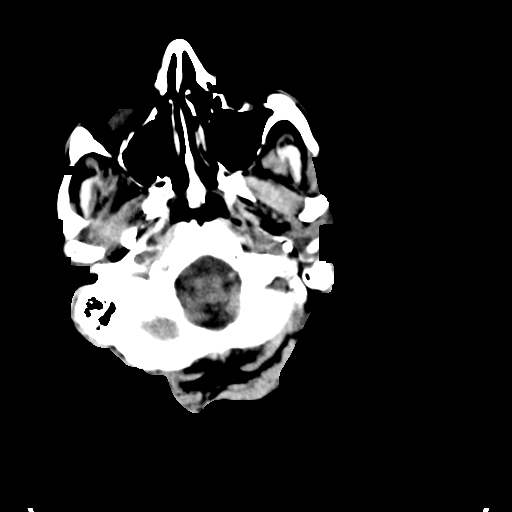
[im 4/31  bone]
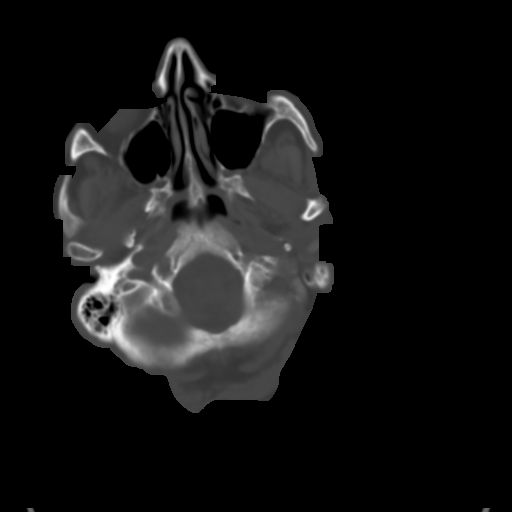
[im 8/31  brain]
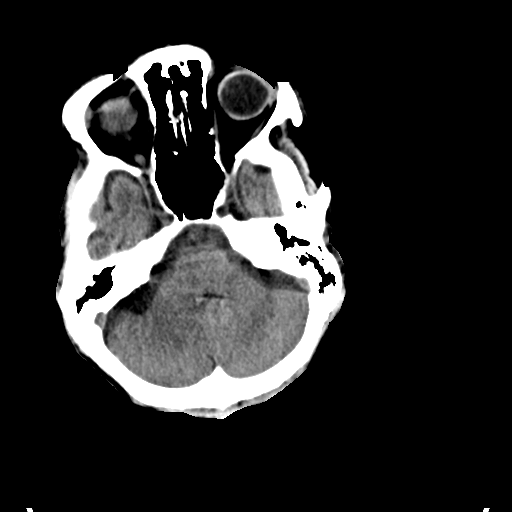
[im 12/31  brain]
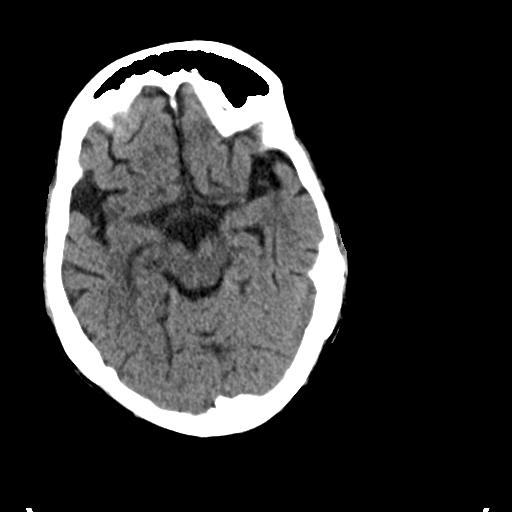
[im 16/31  brain]
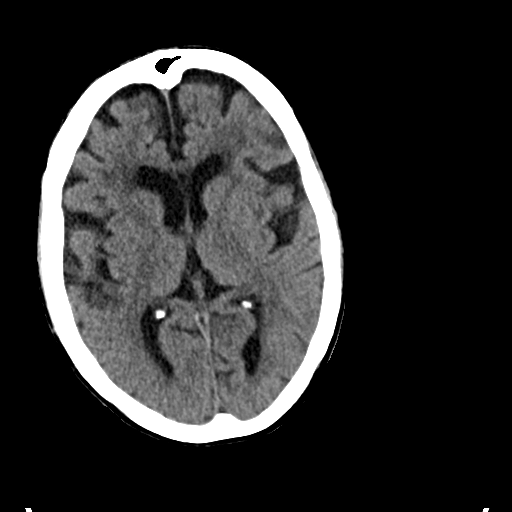
[im 19/31  brain]
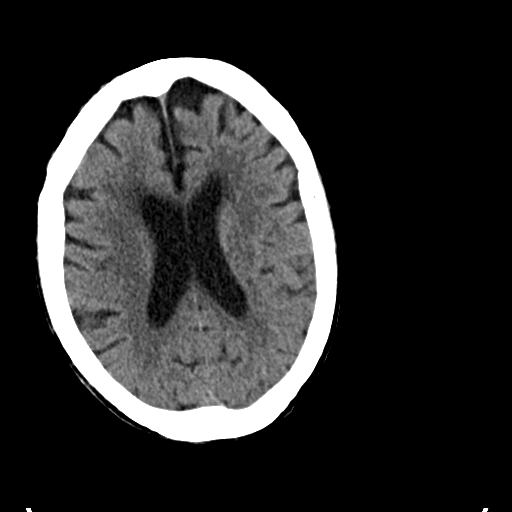
[im 19/31  bone]
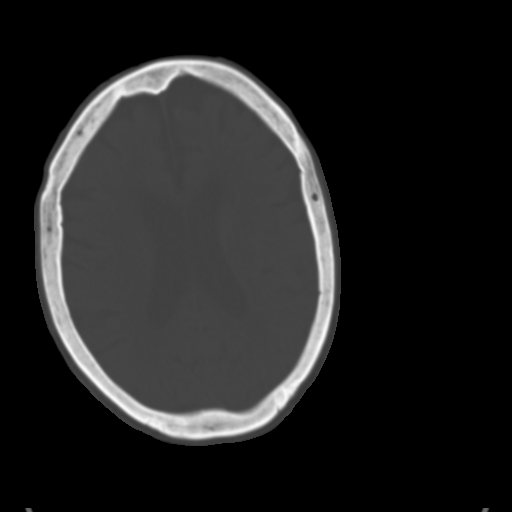
[im 23/31  brain]
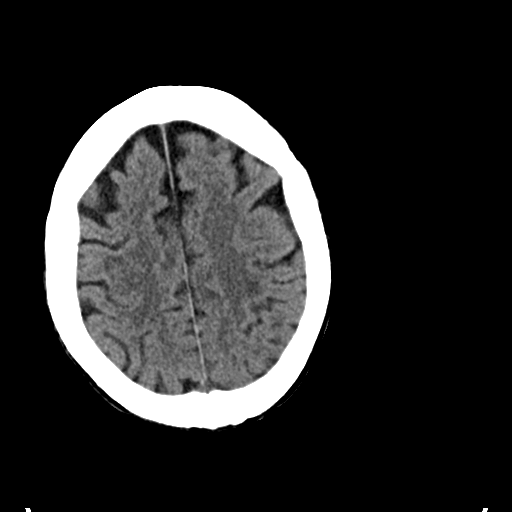
[im 27/31  brain]
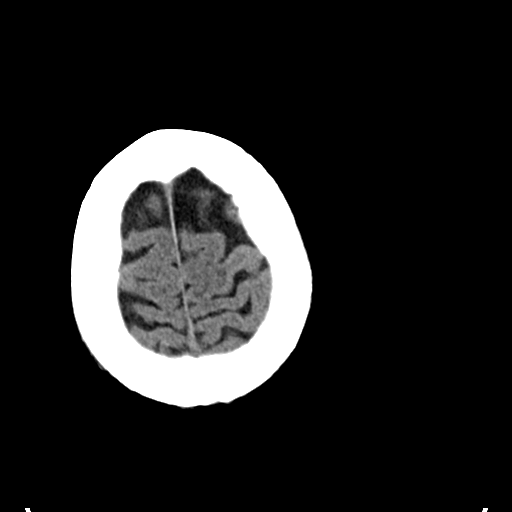

[Series 3: head bone · axial · 0.47mm/px · z∈[-68,-38]mm · 3 of 76 slices shown]
[im 8/76  bone]
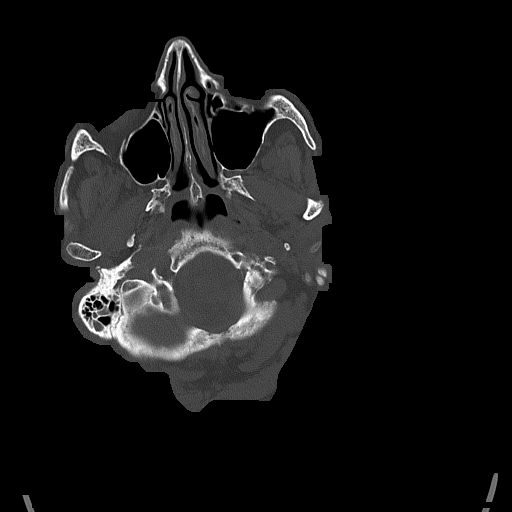
[im 16/76  bone]
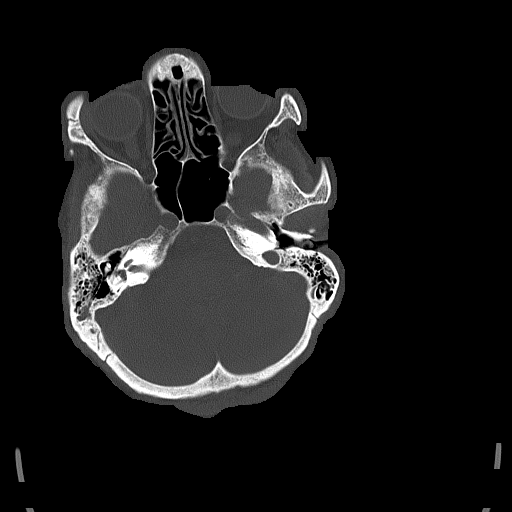
[im 23/76  bone]
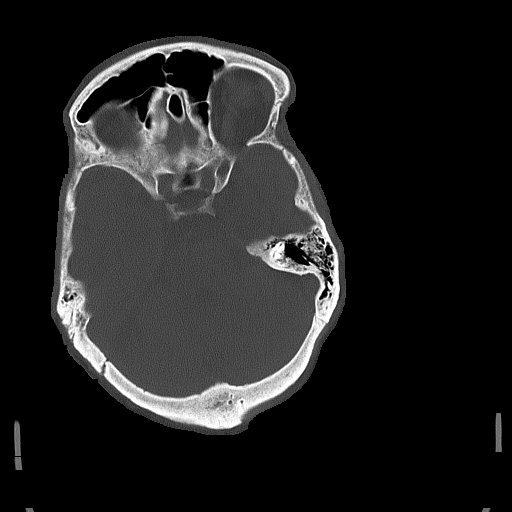

[Series 4: coronal soft tissue · coronal · 0.32mm/px · 3 of 67 slices shown]
[im 23/67  brain]
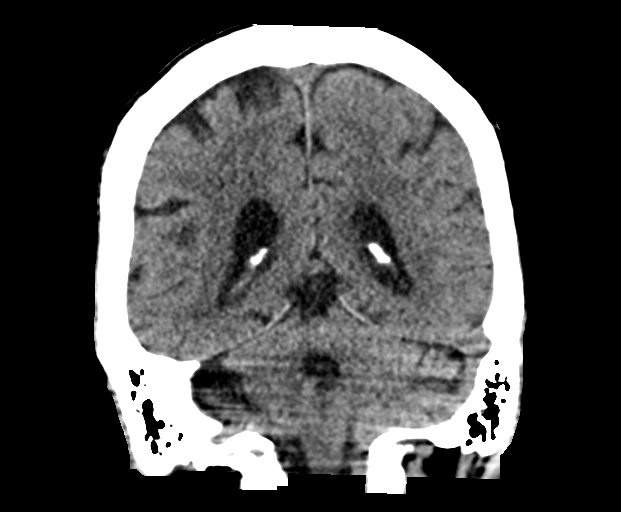
[im 30/67  brain]
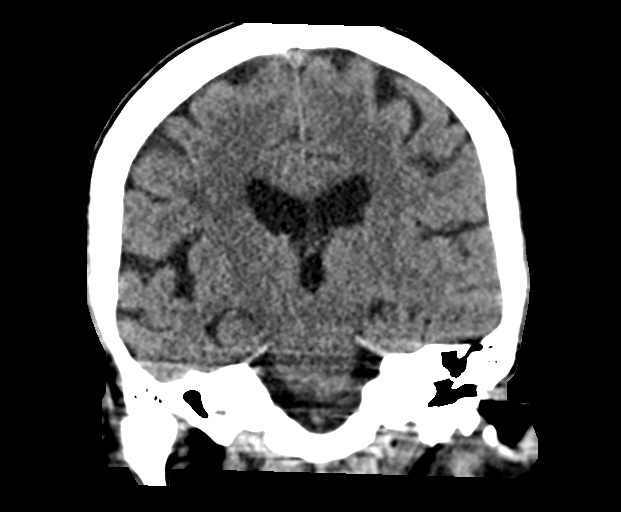
[im 37/67  brain]
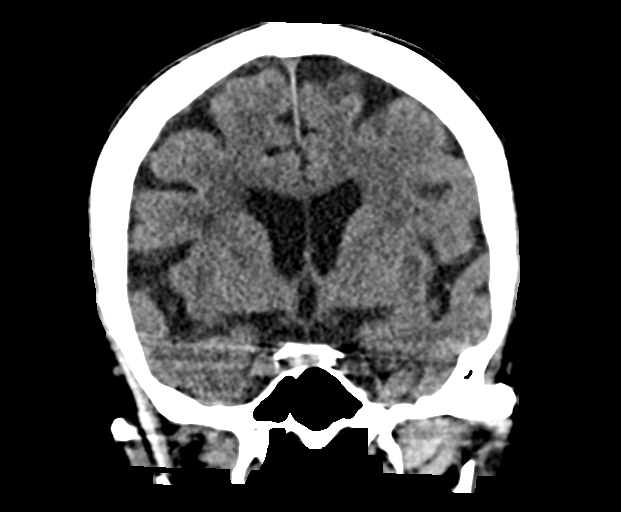

[Series 5: sagittal soft tissue · sagittal · 0.32mm/px · 3 of 55 slices shown]
[im 19/55  brain]
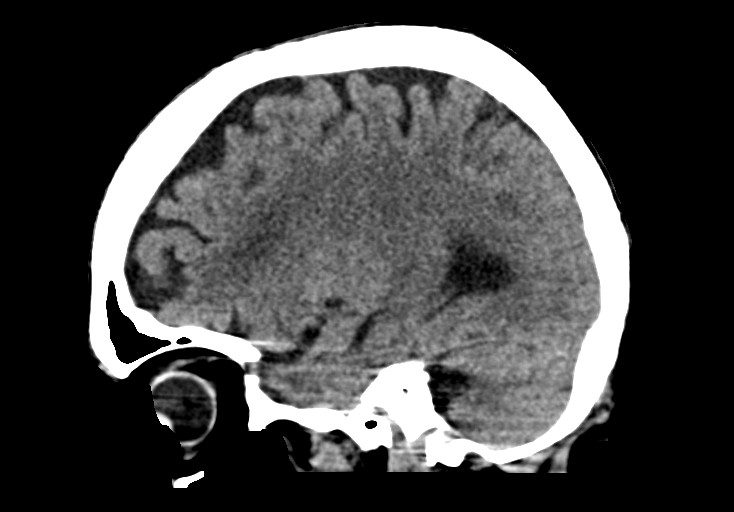
[im 28/55  brain]
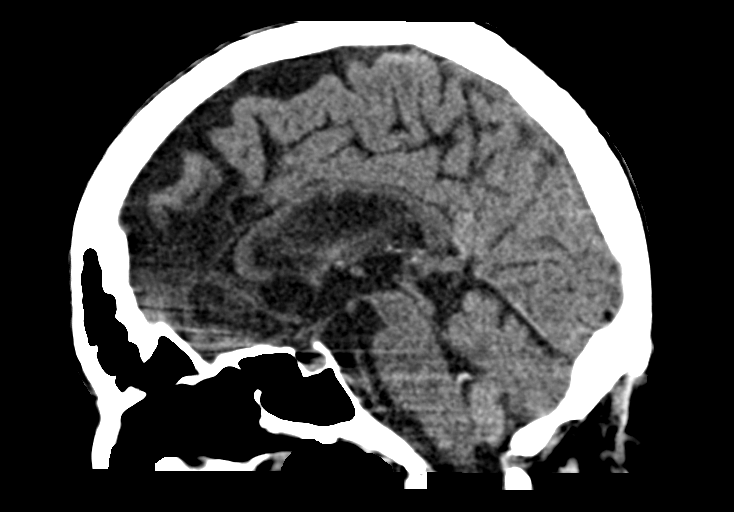
[im 37/55  brain]
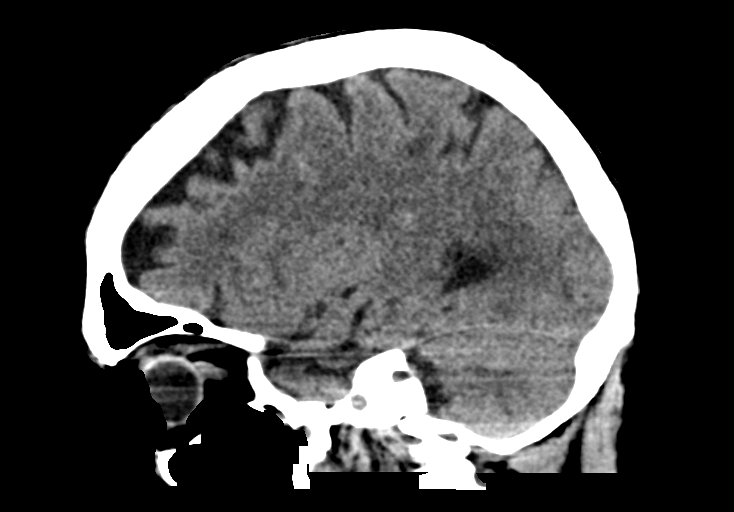

[16 of 47 positions shown; findings below may reference images not displayed]

FINDINGS: Brain: Generalized atrophy with mild progression. Hypodensity
throughout the cerebral white matter bilaterally unchanged.
Hypodensity right lateral basal ganglia unchanged.

Negative for acute infarct, hemorrhage, mass.

Vascular: Negative for hyperdense vessel

Skull: Negative

Sinuses/Orbits: Paranasal sinuses clear.  Negative orbit

Other: None
IMPRESSION: No acute abnormality. Atrophy and chronic microvascular ischemic
change.

## 2021-01-16 MED ORDER — PREDNISONE 20 MG PO TABS
20.0000 mg | ORAL_TABLET | Freq: Every day | ORAL | Status: DC
  Administered 2021-01-16 – 2021-01-23 (×8): 20 mg via ORAL
  Filled 2021-01-16 (×8): qty 1

## 2021-01-16 MED ORDER — ALBUMIN HUMAN 25 % IV SOLN
25.0000 g | Freq: Four times a day (QID) | INTRAVENOUS | Status: AC
  Administered 2021-01-16 – 2021-01-17 (×3): 25 g via INTRAVENOUS
  Filled 2021-01-16 (×3): qty 100

## 2021-01-16 MED ORDER — PREDNISONE 10 MG PO TABS: 30.0000 mg | ORAL_TABLET | Freq: Every day | ORAL | Status: DC

## 2021-01-16 NOTE — Care Plan (Signed)
Called to daughter [name] again, no answer.

## 2021-01-16 NOTE — Progress Notes (Signed)
[facility] Progress Note    Assessment/ Plan:    AKI/CKD stage IIIa, non-oliguric - patient follows with me at [place]. AKI likely hemodynamically mediated with decompensated cirrhosis with worsening ascites s/p paracentesis, and GI bleed.  Also on the DDx would be abdominal compartment syndrome (no UOP prior to paracentesis per pt report).  Urine Na 41 so less likely secondary to HRS.  Recommend administration of IV albumin with fluid removal and limit to no more than 4 liters to prevent further injury to the kidneys. Cr improved to 1.4 post para. C/w IV albumin, midodrine Renal dose meds Avoid nephrotoxic agents such as IV contrast, NSAIDs, phosphate containing bowel preps (FLEETS) Hyponatremia, hypervolemic - due to ascites/decompensated cirrhosis.  Continue with IV albumin and follow after paracentesis.  Na improved after para Decompensated cirrhosis, ascites, esophageal varices, and pancytopenia - GI following.  No SBP but history of melena but no active bleeding found on EGD or flex sig. Blood and urine cultures ordered for today. GI following. Autoimmune hepatitis - off of prograf for 2 months and to start prednisone per GI Pancytopenia - Hematology following. GERD with gastric ulcers - per GI  Subjective:   S/p para 12/22, removed 3.5L. Patient reports that she is feeling much better today in regards to her abdomen. No acute events overnight   Objective:   BP (!) 100/59 (BP Location: Left Arm)    Pulse 71    Temp 97.8 F (36.6 C) (Oral)    Resp 18    Ht 5\' 9"  (1.753 m)    Wt 85.9 kg    SpO2 100%    BMI 27.97 kg/m   Intake/Output Summary (Last 24 hours) at 01/16/2021 1208 Last data filed at 01/16/2021 1102 Gross per 24 hour  Intake 909.87 ml  Output 450 ml  Net 459.87 ml   Weight change:   Physical Exam: KCL:EXNTZGYFVCB ill appearing, NAD CVS:s1s2, rrr Resp:cta bl SWH:QPRFFMBWG (improved), nontender, soft Ext:1+ pitting edema b/l LE's Neuro: awake, alert,  moves all ext spontaneously  Imaging: US Paracentesis  Result Date: 01/15/2021 INDICATION: Patient with a history of autoimmune hepatitis and recurrent ascites. Interventional radiology asked to perform a therapeutic and diagnostic paracentesis up to 3.5 L. EXAM: ULTRASOUND GUIDED PARACENTESIS MEDICATIONS: 1% tetracaine 2 mL COMPLICATIONS: None immediate. PROCEDURE: Informed written consent was obtained from the patient after a discussion of the risks, benefits and alternatives to treatment. A timeout was performed prior to the initiation of the procedure. Initial ultrasound scanning demonstrates a large amount of ascites within the left lower abdominal quadrant. The left lower abdomen was prepped and draped in the usual sterile fashion. 1% tetracaine 2 mL was used for local anesthesia. Following this, a 19 gauge, 7-cm, Yueh catheter was introduced. An ultrasound image was saved for documentation purposes. The paracentesis was performed. The catheter was removed and a dressing was applied. The patient tolerated the procedure well without immediate post procedural complication. FINDINGS: A total of approximately 3.5 L of clear yellow fluid was removed. Samples were sent to the laboratory as requested by the clinical team. IMPRESSION: Successful ultrasound-guided paracentesis yielding 3.5 liters of peritoneal fluid. Read by: Soyla Dryer, NP Electronically Signed   By: Ruthann Cancer M.D.   On: 01/15/2021 14:25   DG Abd Portable 1V  Result Date: 01/14/2021 CLINICAL DATA:  Abdominal pain and distension. History of cirrhosis with ascites. EXAM: PORTABLE ABDOMEN - 1 VIEW COMPARISON:  Radiographs 08/21/2014.  CT 01/12/2021. FINDINGS: 1455 hours. The bowel gas pattern is  nonobstructive. There is central displacement of the bowel consistent with known underlying ascites. Mild bowel wall thickening, similar to recent CT and likely related to ascites/liver disease as well. No supine evidence of free  intraperitoneal air. Surgical clips are present related to prior cholecystectomy with a stable surgical clip in the right pelvis. Advanced asymmetric left hip osteoarthritis noted. IMPRESSION: No evidence of acute abdominal process. Known findings related to previously demonstrated ascites. Electronically Signed   By: Richardean Sale M.D.   On: 01/14/2021 15:08    Labs: BMET Recent Labs  Lab 01/12/21 1153 01/13/21 0423 01/14/21 0415 01/15/21 0407 01/16/21 0441  NA 131* 132* 131* 128* 135  K 4.1 4.2 3.8 3.7 3.6  CL 106 106 105 103 106  CO2 22 20* 20* 21* 20*  GLUCOSE 99 94 94 112* 102*  BUN 18 16 18 16 15   CREATININE 1.22* 1.22* 1.31* 1.60* 1.41*  CALCIUM 7.8* 7.7* 7.9* 7.7* 8.1*  PHOS  --   --  3.7  --  2.5   CBC Recent Labs  Lab 01/14/21 0415 01/14/21 1221 01/15/21 0407 01/16/21 0441  WBC 1.0* 1.2* 1.1* 2.8*  NEUTROABS 0.5* 0.6* 0.5* 2.1  HGB 6.7* 8.4* 9.0* 8.8*  HCT 19.5* 24.3* 25.9* 25.8*  MCV 91.1 89.3 88.1 90.2  PLT 50* 53* 56* 45*    Medications:     (feeding supplement) PROSource Plus  30 mL Oral BID BM   sodium chloride   Intravenous Once   Chlorhexidine Gluconate Cloth  6 each Topical Daily   feeding supplement  237 mL Oral Q24H   fentaNYL (SUBLIMAZE) injection  50 mcg Intravenous Once   levothyroxine  50 mcg Oral Daily   mouth rinse  15 mL Mouth Rinse BID   midodrine  5 mg Oral BID WC   pantoprazole  40 mg Oral BID   polyethylene glycol  17 g Oral QHS   predniSONE  20 mg Oral Q breakfast   tamsulosin  0.4 mg Oral QHS   tetracaine  10 mg Infiltration Once      Gean Quint, MD [facility] 01/16/2021, 12:08 PM

## 2021-01-16 NOTE — Assessment & Plan Note (Addendum)
CT head unremarkable.  Has had BPPV in the past. Vertigo now completely resolved.

## 2021-01-16 NOTE — Progress Notes (Signed)
°  Progress Note   Patient: Stacey Velazquez ENI:[id number] DOB: 06-11-1945 DOA: 01/12/2021     3 DOS: the patient was seen and examined on 01/16/2021   Brief hospital course: Stacey Velazquez is a 75 y.o. F with cirrhosis due to AIH, SSS s/p PPM, CAD s/p remote PCI, CVA without residuals, CKD who presented with rectal bleeding, melena, and abdominal distension/BLE swelling. She was found to have a Hb drop from 10 > 7.    12/19: Admitted, underwent paracentesis 3.5L fluid 12/20: EGD colon showed varices, gastritis but no sitgmata of recent bleeding, flex sig with stool, but no source identified, no melena in colon 12/21: Hgb down to 6.7 g/dL, transfused 1 unit; Liver doppler negative for thrombosis 12/22: Repeat paracentesis  Assessment and Plan * Ascites of liver Repeat paracentesis without signs of SBP  Acute metabolic encephalopathy Sluggish and confused yesterday, improved today after transfusion and bowel movement.  Elevated serum creatinine CKD IV ruled out.  Cr variable recently.    - Trend Cr  Anemia associated with chemotherapy Hgb no change  Abdominal ascites- (present on admission) - Repeat paracentesis  Chronic blood loss anemia- (present on admission) - Consult GI  Hyponatremia- (present on admission) Improved -Continue albumin  Autoimmune hepatitis (Stacey Velazquez)- (present on admission) -  Consult GI, appreciate cares - Plan for steroids when ANC rises - Continue Granix - Continue Cipro for ppx - Continue PPI, midodrine   -Continue albumin 1g/kg today   Pancytopenia (Stacey Velazquez) ANC up to 2 today -Hold Granix - Consult heme  AKI (acute kidney injury) (Stacey Velazquez) Pre-renal, doubt HRS.  May be some compartment component, Cr better to 1.4 from 1.6 today after albumin and paracentesis yesterday -Continue midodrine -Continue albumin today - Consult nephrology  Hypothyroidism- (present on admission) -Continue levothyroxine  Vertigo and diplopia Given her diplopia and some  ataia on the right, I want to rule out stroke - Obtain CT head       Subjective: Patient still feeling very vertiginous and has diplopia.  This been going on since admission, but now she is improving medically, she is bothered by tomorrow.  No focal weakness.  Fusion.  Abdomen felt somewhat better.  She also has some twinges around her pacemaker.  Objective Vital signs were reviewed and unremarkable. General appearance: Elderly adult female, lying in bed, interactive, no acute     HEENT: Mostly edentulous, oropharynx moist, lips dry, no oral lesions, no nasal deformity, discharge, or epistaxis.  No scleral icterus Skin: No suspicious rashes or lesions Cardiac: RRR, soft systolic murmur,.  Mild lower extremity edema bilaterally Respiratory: Normal respiratory rate and rhythm, no rales. Abdomen: Abdomen less distended and more soft than yesterday, no tenderness palpation of MSK:  Neuro: Awake and alert, upper extremity strength is generally weak,.  No testing slight ataxia in the left, speech fluent, diplopia, no visual field deficits, no nystagmus  Psych: Attention normal, affect normal, judgment and insight appear normal   Data Reviewed: Data remarkable for creatinine down to 1.4, magnesium 1.8, regular still elevated, absolute neutrophil count up to 2 hemoglobin 8.8 and stable, platelets down to 45  Family Communication:    Disposition: Status is: Inpatient  Remains inpatient appropriate because: Patient requires ongoing IV albumin, close lab monitoring, CT head, initiation of steroids         Author: Edwin Dada 01/16/2021 10:44 AM  For on call review [email or web address].

## 2021-01-16 NOTE — Progress Notes (Signed)
Patient voided about 1200 mls of clear urine and had 2 BM's.

## 2021-01-16 NOTE — Progress Notes (Addendum)
HEMATOLOGY-ONCOLOGY PROGRESS NOTE  SUBJECTIVE: Biggest issue today is dizziness when getting up out of bed or on the bedside commode.  She is requiring assistance by nursing staff due to dizziness.  No hematemesis or melena.   OBJECTIVE: REVIEW OF SYSTEMS:   Generalized weakness Reports dizziness when getting up and down  PHYSICAL EXAMINATION: ECOG PERFORMANCE STATUS: 1 - Symptomatic but completely ambulatory  Vitals:   01/16/21 0443 01/16/21 0907  BP: 104/67 (!) 100/59  Pulse: 71 71  Resp: 18 18  Temp: 97.8 F (36.6 C) 97.8 F (36.6 C)  SpO2: 99% 100%   Filed Weights   01/12/21 1147 01/14/21 0655  Weight: 177 lb 0.5 oz (80.3 kg) 189 lb 6 oz (85.9 kg)     LABORATORY DATA:  I have reviewed the data as listed CMP Latest Ref Rng & Units 01/16/2021 01/15/2021 01/14/2021  Glucose 70 - 99 mg/dL 102(H) 112(H) 94  BUN 8 - 23 mg/dL 15 16 18   Creatinine 0.44 - 1.00 mg/dL 1.41(H) 1.60(H) 1.31(H)  Sodium 135 - 145 mmol/L 135 128(L) 131(L)  Potassium 3.5 - 5.1 mmol/L 3.6 3.7 3.8  Chloride 98 - 111 mmol/L 106 103 105  CO2 22 - 32 mmol/L 20(L) 21(L) 20(L)  Calcium 8.9 - 10.3 mg/dL 8.1(L) 7.7(L) 7.9(L)  Total Protein 6.5 - 8.1 g/dL 4.9(L) 5.1(L) 5.3(L)  Total Bilirubin 0.3 - 1.2 mg/dL 1.9(H) 2.0(H) 1.8(H)  Alkaline Phos 38 - 126 U/L 55 64 52  AST 15 - 41 U/L 38 47(H) 38  ALT 0 - 44 U/L 21 25 21     Lab Results  Component Value Date   WBC 2.8 (L) 01/16/2021   HGB 8.8 (L) 01/16/2021   HCT 25.8 (L) 01/16/2021   MCV 90.2 01/16/2021   PLT 45 (L) 01/16/2021   NEUTROABS 2.1 01/16/2021    ASSESSMENT AND PLAN: 1.  Pancytopenia Status post blood transfusion with improvement in hemoglobin levels  Platelets are down slightly to 45,000 today Neutropenia: WBC 2.8, ANC pending; Granix injections being given daily. Will stop Granix injections once ANC above 1.0.  Will follow along  Attending Note  I personally saw and examined Stacey Velazquez. The plan of care was discussed with her.  I agree with the physical exam findings and assessment and plan as documented above. I performed the majority of the counseling and assessment and plan regarding this encounter Pancytopenia: WBC improving. ANC 2.1 (will D/C granix) Anemia and Thrombocytopenia: Stable 3. Dizziness: CT head negative We are available as needed  Signed [name], MD

## 2021-01-16 NOTE — Care Management Important Message (Signed)
Important Message  Patient Details IM Letter placed in Patients room. Name: Stacey Velazquez MRN: [id number] Date of Birth: 08-06-1945   Medicare Important Message Given:  Yes     Kerin Salen 01/16/2021, 1:30 PM

## 2021-01-16 NOTE — Progress Notes (Signed)
[facility] Progress Note  CC:   Autoimmune hepatitis  Subjective: She had dizziness when getting up from the commode.  She was assisted back to bed by the nursing staff.  No chest pain or shortness of breath.  No nausea or vomiting.  No significant abdominal pain/tightness today.   Objective:  Vital signs in last 24 hours: Temp:  [97.5 F (36.4 C)-97.8 F (36.6 C)] 97.8 F (36.6 C) (12/23 0443) Pulse Rate:  [65-71] 71 (12/23 0443) Resp:  [16-18] 18 (12/23 0443) BP: (104-116)/(67-72) 104/67 (12/23 0443) SpO2:  [99 %-100 %] 99 % (12/23 0443) Last BM Date: 01/15/21 General:   75 year old female in no acute distress. Heart: Regular rate and rhythm, no murmurs. Pulm: Breath sounds clear, diminished in the bases. Abdomen: Abdomen is much less distended today, nontender.  Positive bowel sounds to all 4 quadrants.  She is oozing serous fluid from the paracentesis site/LLQ, a dressing is partially saturated with serous fluid, slight pink tinge.  The LLQ paracentesis site is nontender without erythema or hematoma. Extremities: Lower extremities with 1+ pitting edema.  Compression stockings in use. Neurologic:  Alert and  oriented x 4. No asterixis.  Speech is clear.  She moves all extremities equally Psych:  Alert and cooperative. Normal mood and affect.  Intake/Output from previous day: 12/22 0701 - 12/23 0700 In: 1029.9 [P.O.:480; IV Piggyback:549.9] Out: 450 [Urine:450] Intake/Output this shift: No intake/output data recorded.  Lab Results: Recent Labs    01/14/21 1221 01/15/21 0407 01/16/21 0441  WBC 1.2* 1.1* 2.8*  HGB 8.4* 9.0* 8.8*  HCT 24.3* 25.9* 25.8*  PLT 53* 56* 45*   BMET Recent Labs    01/14/21 0415 01/15/21 0407 01/16/21 0441  NA 131* 128* 135  K 3.8 3.7 3.6  CL 105 103 106  CO2 20* 21* 20*  GLUCOSE 94 112* 102*  BUN 18 16 15   CREATININE 1.31* 1.60* 1.41*  CALCIUM 7.9* 7.7* 8.1*   LFT Recent Labs    01/14/21 0415 01/15/21 0407  01/16/21 0441  PROT 5.3*   < > 4.9*  ALBUMIN 3.4*   < > 2.9*  AST 38   < > 38  ALT 21   < > 21  ALKPHOS 52   < > 55  BILITOT 1.8*   < > 1.9*  BILIDIR 0.6*  --   --   IBILI 1.2*  --   --    < > = values in this interval not displayed.   PT/INR Recent Labs    01/15/21 0407 01/16/21 0441  LABPROT 32.5* 34.4*  INR 3.2* 3.4*   Hepatitis Panel No results for input(s): HEPBSAG, HCVAB, HEPAIGM, HEPBIGM in the last 72 hours.  US Paracentesis  Result Date: 01/15/2021 INDICATION: Patient with a history of autoimmune hepatitis and recurrent ascites. Interventional radiology asked to perform a therapeutic and diagnostic paracentesis up to 3.5 L. EXAM: ULTRASOUND GUIDED PARACENTESIS MEDICATIONS: 1% tetracaine 2 mL COMPLICATIONS: None immediate. PROCEDURE: Informed written consent was obtained from the patient after a discussion of the risks, benefits and alternatives to treatment. A timeout was performed prior to the initiation of the procedure. Initial ultrasound scanning demonstrates a large amount of ascites within the left lower abdominal quadrant. The left lower abdomen was prepped and draped in the usual sterile fashion. 1% tetracaine 2 mL was used for local anesthesia. Following this, a 19 gauge, 7-cm, Yueh catheter was introduced. An ultrasound image was saved for documentation purposes. The paracentesis was performed. The  catheter was removed and a dressing was applied. The patient tolerated the procedure well without immediate post procedural complication. FINDINGS: A total of approximately 3.5 L of clear yellow fluid was removed. Samples were sent to the laboratory as requested by the clinical team. IMPRESSION: Successful ultrasound-guided paracentesis yielding 3.5 liters of peritoneal fluid. Read by: Soyla Dryer, NP Electronically Signed   By: Ruthann Cancer M.D.   On: 01/15/2021 14:25   DG Abd Portable 1V  Result Date: 01/14/2021 CLINICAL DATA:  Abdominal pain and distension. History  of cirrhosis with ascites. EXAM: PORTABLE ABDOMEN - 1 VIEW COMPARISON:  Radiographs 08/21/2014.  CT 01/12/2021. FINDINGS: 1455 hours. The bowel gas pattern is nonobstructive. There is central displacement of the bowel consistent with known underlying ascites. Mild bowel wall thickening, similar to recent CT and likely related to ascites/liver disease as well. No supine evidence of free intraperitoneal air. Surgical clips are present related to prior cholecystectomy with a stable surgical clip in the right pelvis. Advanced asymmetric left hip osteoarthritis noted. IMPRESSION: No evidence of acute abdominal process. Known findings related to previously demonstrated ascites. Electronically Signed   By: Richardean Sale M.D.   On: 01/14/2021 15:08    Assessment / Plan:  5) 75 year old female with autoimmune hepatitis with decompensated cirrhosis, ascites, esophageal varices and pancytopenia admitted to the hospital 01/12/2021 with generalized abdominal pain, rectal bleeding and melena x 1 day. Started on PPI infusion and Octreotide infusion. Admission Hg 10.1 (baseline Hg 9 - 10.07) -> Hg 7.8 -> 6.7 on 12/21 transfused 1 unit of PRBCs -> Hg 9.0 -> today Hg 8.8. CTAP wo contrast showed cirrhosis with marked ascites. S/P paracentesis 12/19, 3.5 L of peritoneal fluid removed. No SBP. Repeat paracentesis 12/22, 3.5L peritoneal fluid removed, no SBP.  Cytology pending. No overt hepatic encephalopathy. Off Prograf since 11/06/2020. T. Bili 1.6 -> 2.0.  Stable AST/ALT levels. IgG level 3,077 -> 1,593. Liver doppler 12/21 showed ascites, no evidence of hepatic or portal vein thrombosis. MELD 27, MELD Na 31. Patient is not a liver transplant candidate. S/P EGD 12/21 showed grade II esophageal varices without stigmata of recent bleeding, gastritis and portal hypertensive gastropathy. Gastric biopsies pending. Octreotide dc'd. Flex sig 12/20 showed brown stool in the rectum, sigmoid, descending and transverse colon, multiple  nonbleeding polyps were seen in the sigmoid, descending and transverse colon (left insitu) and diverticulosis was present throughout the examined colon. -Transfuse for Hg level < 7.0 -CBC in am -No plans for further endoscopic evaluation at this time as she is not actively GI bleeding -Continue Cipro 400mg  IV bid for prophylaxis  -Continue PPI po bid -2 Gm low sodium diet as tolerated  -Pain management per the hospitalist -To consider low-dose Prednisone for autoimmune hepatitis when ANC > 1.0.  She was previously on Prograf without remission, off Prograf x 2 months. I contacted hepatology specialist Roosevelt Locks NP at [facility] who saw the patient at the liver transplant clinic 05/2020, she recommended Prednisone 30mg  Qday with a taper if no infectious process present. No SBP. Blood cx negative no growth at 24hrs. She re-confirmed the patient is not a transplant candidate.  -Await further recommendations from Dr. Lorenso Courier   2) Pancytopenia. WBC 1.0 -> 2.8. Hg 7.8 -> 6.7 -> transfused 1 unit of PRBCs 12.21 -> Hg 9.0 -> 8.8. PLT 37 -> transfused 1 pack of platelets 12/20 prior EGD/flex sig -> today PLT count 45. Iron 45. B12 666.  No active GI bleeding per endoscopic evaluation.  Passing brown stools past 2 days. CTAP without evidence of splenomegaly. Seen by hematology, pancytopenia likely due to her autoimmune hepatitis superimposed by bone marrow suppression. Started on Granix 12/22, goal ANC > 1.0.  -Appreciate hematology recommendation   3) Coagulopathy secondary to cirrhosis. INR 2.4 -> 3.4 -> Vitamin K 10mg  IV QD  started 12/21 -> today INR 3.4 -Daily INR   4) GERD, history of gastric ulcers -See plan in # 1   5) CKD. Cr. 1.22 -> 1.31 ->1.60 -> Renal consult, unlikely HRS with urine Na+ 41. Restarted on IV albumin. Today Cr 1.41. -Urine  sodium level ordered -Recommend nephrology consult    6) Hyponatremia. Na+ 128 due to cirrhosis (not on diuretics) -> today NA+ 135.   7) Dizziness  this morning after sitting on the commode. No CP or SOB. BP 104/67 at 4am. -Nursing staff to repeat vital  signs now, check for orthostatic hypotension    8) History of adenomatous and tubulovillous colon polyps. Flex sig done during this hospitalization showed multiple polyps in the transverse and left colon, left insitu due to high risk of bleeding -Follow up colonoscopy as an outpatient    9) History of a stable pancreatic tail cyst       Principal Problem:   Ascites of liver Active Problems:   Pancytopenia (HCC)   Hypothyroidism   Autoimmune hepatitis (HCC)   Esophageal varices determined by endoscopy ([place])   Hyponatremia   Chronic blood loss anemia   Abdominal ascites   Abdominal distension   Anemia associated with chemotherapy   Elevated serum creatinine   Neutropenia, drug-induced (HCC)   AKI (acute kidney injury) ([place])   Acute metabolic encephalopathy     LOS: 3 days   Stacey Velazquez  01/16/2021, 09:19AM

## 2021-01-16 NOTE — Progress Notes (Signed)
Physical Therapy Treatment Patient Details Name: Stacey Velazquez MRN: [id number] DOB: July 08, 1945 Today's Date: 01/16/2021   History of Present Illness Pt is a 75 y.o. female who presented to [facility] ED with progressively worsening abdominal distention for the past month associated with worsening dyspnea, lower extremity edema and abdominal pain that excalated. Associated with few days of melena and hematochezia.  No nausea or vomiting. Paracentisis done on 01/12/2021 with 3.5 L of fluid removed by IR.  Abdominal fluid not suggestive of SBP. PMH significant for  autoimmune hepatitis,  with abdominal ascites, anemia of chronic disease, colon polyps, anxiety, bilateral lower extremity edema, bipolar disorder with insomnia, cutaneous horn, drug-induced parkinsonism, esophageal varices, fibromyalgia, gastric ulcer, GERD, gingival abscess, glaucoma, CAD, history of MI, hypothyroidism, migraine headaches, neutropenia, osteoporosis, paranoid schizophrenia, polyarthritis, rectal mass, seizure disorder, sinus arrest, sick sinus syndrome, pacemaker placement, history of nonhemorrhagic stroke, thrombocytopenia    PT Comments    Pt agreeable to working with PT and allowing me to check orthostatics. See vitals section for readings. Pt reported dizziness when transitioning from supine to sitting. After ~20 seconds, pt was able to stand. I did not notice any nystagmus. She reports she sometimes has double vision when the dizziness occurs. Pt denied any further dizziness during session. She went into great detail about how she functions at home alone. She agrees that we need to work on ambulation since she does have to be able to safely ambulate across her home (roughly ~20 feet) with use of her 4 wheeled walker. Will continue to follow and progress activity as safely able.     Recommendations for follow up therapy are one component of a multi-disciplinary discharge planning process, led by the attending physician.   Recommendations may be updated based on patient status, additional functional criteria and insurance authorization.  Follow Up Recommendations  Skilled nursing-short term rehab (<3 hours/day) (vs HHPT-depending on progress and whether pt is agreeable to placement or not)     Assistance Recommended at Discharge Frequent or constant Supervision/Assistance  Equipment Recommendations  None recommended by PT    Recommendations for Other Services       Precautions / Restrictions Precautions Precautions: Fall Precaution Comments: dizziness Restrictions Weight Bearing Restrictions: No     Mobility  Bed Mobility Overal bed mobility: Needs Assistance Bed Mobility: Rolling;Sidelying to Sit;Sit to Sidelying Rolling: Modified independent (Device/Increase time) Sidelying to sit: Modified independent (Device/Increase time);HOB elevated     Sit to sidelying: HOB elevated;Modified independent (Device/Increase time) General bed mobility comments: Increaesed time. No physical assistance provided. Pt does rely on bedrail.    Transfers Overall transfer level: Needs assistance Equipment used: Rollator (4 wheels) Transfers: Sit to/from Stand Sit to Stand: From elevated surface;Min assist           General transfer comment: Min A to stabilize walker-pt prefers to pull up on walker to stand.    Ambulation/Gait                   Stairs             Wheelchair Mobility    Modified Rankin (Stroke Patients Only)       Balance Overall balance assessment: Needs assistance         Standing balance support: Bilateral upper extremity supported;Reliant on assistive device for balance Standing balance-Leahy Scale: Poor  Cognition Arousal/Alertness: Awake/alert Behavior During Therapy: WFL for tasks assessed/performed Overall Cognitive Status: Within Functional Limits for tasks assessed                                  General Comments: A&O x4, ?safety awareness, difficulty with mobility while at home living alone, getting in/out of door.        Exercises      General Comments        Pertinent Vitals/Pain Pain Assessment: Faces Faces Pain Scale: Hurts little more Pain Location: generalized with mobility Pain Descriptors / Indicators: Discomfort;Sore Pain Intervention(s): Limited activity within patient's tolerance;Monitored during session;Repositioned    Home Living                          Prior Function            PT Goals (current goals can now be found in the care plan section) Progress towards PT goals: Progressing toward goals    Frequency    Min 3X/week      PT Plan Current plan remains appropriate;Frequency needs to be updated    Co-evaluation              AM-PAC PT "6 Clicks" Mobility   Outcome Measure  Help needed turning from your back to your side while in a flat bed without using bedrails?: None Help needed moving from lying on your back to sitting on the side of a flat bed without using bedrails?: None Help needed moving to and from a bed to a chair (including a wheelchair)?: A Little Help needed standing up from a chair using your arms (e.g., wheelchair or bedside chair)?: A Little Help needed to walk in hospital room?: A Lot Help needed climbing 3-5 steps with a railing? : Total 6 Click Score: 17    End of Session   Activity Tolerance: Patient tolerated treatment well Patient left: in bed;with call bell/phone within reach;with bed alarm set   PT Visit Diagnosis: Pain;Other abnormalities of gait and mobility (R26.89)     Time: 5797-2820 PT Time Calculation (min) (ACUTE ONLY): 44 min  Charges:  $Therapeutic Activity: 38-52 mins                         Doreatha Massed, PT Acute Rehabilitation  Office: [phone] Pager: [phone]

## 2021-01-17 DIAGNOSIS — R7989 Other specified abnormal findings of blood chemistry: Secondary | ICD-10-CM

## 2021-01-17 DIAGNOSIS — R188 Other ascites: Secondary | ICD-10-CM | POA: Diagnosis not present

## 2021-01-17 LAB — BASIC METABOLIC PANEL
Anion gap: 7 (ref 5–15)
BUN: 18 mg/dL (ref 8–23)
CO2: 22 mmol/L (ref 22–32)
Calcium: 8.2 mg/dL — ABNORMAL LOW (ref 8.9–10.3)
Chloride: 107 mmol/L (ref 98–111)
Creatinine, Ser: 1.46 mg/dL — ABNORMAL HIGH (ref 0.44–1.00)
GFR, Estimated: 37 mL/min — ABNORMAL LOW (ref >60)
Glucose, Bld: 119 mg/dL — ABNORMAL HIGH (ref 70–99)
Potassium: 4 mmol/L (ref 3.5–5.1)
Sodium: 136 mmol/L (ref 135–145)

## 2021-01-17 LAB — CBC
HCT: 25.3 % — ABNORMAL LOW (ref 36.0–46.0)
Hemoglobin: 8.5 g/dL — ABNORMAL LOW (ref 12.0–15.0)
MCH: 30.2 pg (ref 26.0–34.0)
MCHC: 33.6 g/dL (ref 30.0–36.0)
MCV: 90 fL (ref 80.0–100.0)
Platelets: 44 K/uL — ABNORMAL LOW (ref 150–400)
RBC: 2.81 MIL/uL — ABNORMAL LOW (ref 3.87–5.11)
RDW: 14.6 % (ref 11.5–15.5)
WBC: 2.9 K/uL — ABNORMAL LOW (ref 4.0–10.5)
nRBC: 0 % (ref 0.0–0.2)

## 2021-01-17 LAB — PROTIME-INR
INR: 3.7 — ABNORMAL HIGH (ref 0.8–1.2)
Prothrombin Time: 36.5 s — ABNORMAL HIGH (ref 11.4–15.2)

## 2021-01-17 NOTE — Progress Notes (Signed)
[facility] Progress Note    Since last GI note: I am meeting her for the first time this morning. She is very talkative.  Eating well, no abd pains.    Objective: Vital signs in last 24 hours: Temp:  [97.5 F (36.4 C)-97.7 F (36.5 C)] 97.5 F (36.4 C) (12/24 0411) Pulse Rate:  [69-81] 69 (12/24 0411) Resp:  [18] 18 (12/24 0411) BP: (100-113)/(59-72) 100/59 (12/24 0411) SpO2:  [99 %] 99 % (12/24 0411) Last BM Date: 01/15/21 General: alert and oriented times 3 Heart: regular rate and rythm Abdomen: soft, non-tender, non-distended, normal bowel sounds, + mild ascites,  Trace lower ext edema bilaterally   Lab Results: Recent Labs    01/15/21 0407 01/16/21 0441 01/17/21 0406  WBC 1.1* 2.8* 2.9*  HGB 9.0* 8.8* 8.5*  PLT 56* 45* 44*  MCV 88.1 90.2 90.0   Recent Labs    01/15/21 0407 01/16/21 0441 01/17/21 0406  NA 128* 135 136  K 3.7 3.6 4.0  CL 103 106 107  CO2 21* 20* 22  GLUCOSE 112* 102* 119*  BUN 16 15 18   CREATININE 1.60* 1.41* 1.46*  CALCIUM 7.7* 8.1* 8.2*   Recent Labs    01/15/21 0407 01/16/21 0441  PROT 5.1* 4.9*  ALBUMIN 2.9* 2.9*  AST 47* 38  ALT 25 21  ALKPHOS 64 55  BILITOT 2.0* 1.9*   Recent Labs    01/16/21 0441 01/17/21 0406  INR 3.4* 3.7*    Medications: Scheduled Meds:  (feeding supplement) PROSource Plus  30 mL Oral BID BM   sodium chloride   Intravenous Once   Chlorhexidine Gluconate Cloth  6 each Topical Daily   feeding supplement  237 mL Oral Q24H   fentaNYL (SUBLIMAZE) injection  50 mcg Intravenous Once   levothyroxine  50 mcg Oral Daily   mouth rinse  15 mL Mouth Rinse BID   midodrine  5 mg Oral BID WC   pantoprazole  40 mg Oral BID   polyethylene glycol  17 g Oral QHS   predniSONE  20 mg Oral Q breakfast   tamsulosin  0.4 mg Oral QHS   tetracaine  10 mg Infiltration Once   Continuous Infusions:  ciprofloxacin 400 mg (01/16/21 2045)   PRN Meds:.ondansetron **OR** ondansetron (ZOFRAN)  IV   Assessment/Plan: 75 y.o. female with decompensated cirrhosis from autoimmune hepatitis, admitted with overt GI bleeding (BRBPR and melena), pancytopenia  She has received 1 unit PRBC and 1 unit Platelets this admission. Bleeding has clinically stopped.  Briefly on octreotide, that is stopped now. Underwent EGD and flex sig (see reports in epic)  She does not have SBP by paracentesis  Current MELD-Na is 27  Prednisone 20mg  once daily was started yesterday to treat her AIH, hopefully reduce risk of further liver decompensation on advise of [facility] team that knows her quite well.  Not really sure what the inpatient endpoints are for her but certainly her MELD is very high and she seems too weak to really be going home anytime soon.   Milus Banister, MD  01/17/2021, 10:58 AM [facility] Pager [phone]

## 2021-01-17 NOTE — Progress Notes (Signed)
[facility] Progress Note    Assessment/ Plan:    AKI/CKD stage IIIa, non-oliguric - patient follows with me at [place]. AKI likely hemodynamically mediated with decompensated cirrhosis with worsening ascites s/p paracentesis, and GI bleed.  Also on the DDx would be abdominal compartment syndrome (no UOP prior to paracentesis per pt report).  Urine Na 41 so less likely secondary to HRS.  Peak Cr 1.6. Recommend administration of IV albumin with fluid removal and limit to no more than 4 liters to prevent further injury to the kidneys. Cr improved to 1.4 post para--stable today. S/p IV albumin, c/w midodrine Renal dose meds Avoid nephrotoxic agents such as IV contrast, NSAIDs, phosphate containing bowel preps (FLEETS) Hyponatremia, hypervolemic - due to ascites/decompensated cirrhosis.  Continue with IV albumin and follow after paracentesis.  Na improved after para, Na WN nowL Decompensated cirrhosis, ascites, esophageal varices, and pancytopenia - GI following.  No SBP but history of melena but no active bleeding found on EGD or flex sig. GI on board Autoimmune hepatitis - off of prograf for 2 months, now on prednisone per GI Pancytopenia - Hematology following. GERD with gastric ulcers - per GI  Will sign off from a nephrology perspective. Please call with any questions/concerns. Follows with me at the office, please let us know once she has been discharged that way we can schedule her for a follow up accordingly.  Subjective:   S/p para 12/22, removed 3.5L. Patient reports that she is feeling much better today in regards to her abdomen. No acute events overnight   Objective:   BP (!) 113/59 (BP Location: Left Arm)    Pulse 67    Temp (!) 97.5 F (36.4 C) (Axillary)    Resp 18    Ht 5\' 9"  (1.753 m)    Wt 85.9 kg    SpO2 98%    BMI 27.97 kg/m   Intake/Output Summary (Last 24 hours) at 01/17/2021 1352 Last data filed at 01/17/2021 1346 Gross per 24 hour  Intake 566.31 ml   Output 850 ml  Net -283.69 ml   Weight change:   Physical Exam: PJA:[id number] ill appearing, NAD CVS:s1s2, rrr Resp:cta bl LPF:[id number] (improved), nontender, soft Ext:1+ pitting edema b/l LE's Neuro: awake, alert, moves all ext spontaneously  Imaging: CT HEAD WO CONTRAST (5MM)  Result Date: 01/16/2021 CLINICAL DATA:  Dizziness.  Confusion. EXAM: CT HEAD WITHOUT CONTRAST TECHNIQUE: Contiguous axial images were obtained from the base of the skull through the vertex without intravenous contrast. COMPARISON:  CT head 08/21/2014 FINDINGS: Brain: Generalized atrophy with mild progression. Hypodensity throughout the cerebral white matter bilaterally unchanged. Hypodensity right lateral basal ganglia unchanged. Negative for acute infarct, hemorrhage, mass. Vascular: Negative for hyperdense vessel Skull: Negative Sinuses/Orbits: Paranasal sinuses clear.  Negative orbit Other: None IMPRESSION: No acute abnormality. Atrophy and chronic microvascular ischemic change. Electronically Signed   By: Franchot Gallo M.D.   On: 01/16/2021 13:33   US Paracentesis  Result Date: 01/15/2021 INDICATION: Patient with a history of autoimmune hepatitis and recurrent ascites. Interventional radiology asked to perform a therapeutic and diagnostic paracentesis up to 3.5 L. EXAM: ULTRASOUND GUIDED PARACENTESIS MEDICATIONS: 1% tetracaine 2 mL COMPLICATIONS: None immediate. PROCEDURE: Informed written consent was obtained from the patient after a discussion of the risks, benefits and alternatives to treatment. A timeout was performed prior to the initiation of the procedure. Initial ultrasound scanning demonstrates a large amount of ascites within the left lower abdominal quadrant. The left lower abdomen was prepped and draped  in the usual sterile fashion. 1% tetracaine 2 mL was used for local anesthesia. Following this, a 19 gauge, 7-cm, Yueh catheter was introduced. An ultrasound image was saved for documentation  purposes. The paracentesis was performed. The catheter was removed and a dressing was applied. The patient tolerated the procedure well without immediate post procedural complication. FINDINGS: A total of approximately 3.5 L of clear yellow fluid was removed. Samples were sent to the laboratory as requested by the clinical team. IMPRESSION: Successful ultrasound-guided paracentesis yielding 3.5 liters of peritoneal fluid. Read by: Soyla Dryer, NP Electronically Signed   By: Ruthann Cancer M.D.   On: 01/15/2021 14:25    Labs: BMET Recent Labs  Lab 01/12/21 1153 01/13/21 0423 01/14/21 0415 01/15/21 0407 01/16/21 0441 01/17/21 0406  NA 131* 132* 131* 128* 135 136  K 4.1 4.2 3.8 3.7 3.6 4.0  CL 106 106 105 103 106 107  CO2 22 20* 20* 21* 20* 22  GLUCOSE 99 94 94 112* 102* 119*  BUN 18 16 18 16 15 18   CREATININE 1.22* 1.22* 1.31* 1.60* 1.41* 1.46*  CALCIUM 7.8* 7.7* 7.9* 7.7* 8.1* 8.2*  PHOS  --   --  3.7  --  2.5  --    CBC Recent Labs  Lab 01/14/21 0415 01/14/21 1221 01/15/21 0407 01/16/21 0441 01/17/21 0406  WBC 1.0* 1.2* 1.1* 2.8* 2.9*  NEUTROABS 0.5* 0.6* 0.5* 2.1  --   HGB 6.7* 8.4* 9.0* 8.8* 8.5*  HCT 19.5* 24.3* 25.9* 25.8* 25.3*  MCV 91.1 89.3 88.1 90.2 90.0  PLT 50* 53* 56* 45* 44*    Medications:     (feeding supplement) PROSource Plus  30 mL Oral BID BM   Chlorhexidine Gluconate Cloth  6 each Topical Daily   feeding supplement  237 mL Oral Q24H   levothyroxine  50 mcg Oral Daily   mouth rinse  15 mL Mouth Rinse BID   midodrine  5 mg Oral BID WC   pantoprazole  40 mg Oral BID   polyethylene glycol  17 g Oral QHS   predniSONE  20 mg Oral Q breakfast   tamsulosin  0.4 mg Oral QHS      Gean Quint, MD [facility] 01/17/2021, 1:52 PM

## 2021-01-17 NOTE — Progress Notes (Signed)
°  Progress Note   Patient: Stacey Velazquez:[id number] DOB: [birth year] DOA: 01/12/2021     4 DOS: the patient was seen and examined on 01/17/2021   Brief hospital course: Mrs. Stacey Velazquez is a 75 y.o. F with cirrhosis due to AIH, SSS s/p PPM, CAD s/p remote PCI, CVA without residuals, CKD who presented with rectal bleeding, melena, and abdominal distension/BLE swelling. She was found to have a Hb drop from 10 > 7.    12/19: Admitted, underwent paracentesis 3.5L fluid 12/20: EGD colon showed varices, gastritis but no sitgmata of recent bleeding, flex sig with stool, but no source identified, no melena in colon 12/21: Hgb down to 6.7 g/dL, transfused 1 unit; Liver doppler negative for thrombosis 12/22: Repeat paracentesis 12/23: CT head due to vertigo, diplopia, normal 12/24: Improving on steroids, vertigo resolved      Assessment and Plan * Ascites of liver  Autoimmune hepatitis ([place])- (present on admission) -  Consult GI, appreciate cares - Continue prednisone 20, plan for no taper at d/c  - Continue Cipro for ppx, day 5 of 7 - Continue PPI, midodrine   - Hold albumin   Pancytopenia (HCC) ANC >0.8 now -Hold Granix   AKI (acute kidney injury) ([place]) Pre-renal azotemia.  Cr now stable at 1.4    -Continue midodrine  - Consult nephrology  Hypothyroidism- (present on admission) -Continue levothyroxine  Vertigo CT head unremarkable.  Has had BPPV in the past. Vertigo now completely resolved.     Subjective: Vertigo is resolved.  No fever, dysuria.  She is making urine.  No vomiting, cough.  Swelling is improved.  Overall she is doing better.  Objective Vital signs were reviewed and unremarkable. General appearance: Elderly adult female, sitting up in recliner, eating breakfast, no acute distress.  Interactive.     HEENT: Partially edentulous, lips dry, no oral lesions, hearing normal Skin: No suspicious rashes or lesions. Cardiac: RRR, no murmurs, 1+ bilateral  pitting edema bilaterally Respiratory: Respiratory rate and rhythm, Without rales or wheezes. Abdomen: Abdomen soft, mild distention, no tense ascites, no tenderness palpation. MSK:  Neuro: Awake alert, extraocular movements intact, face symmetric, speech fluent, no nystagmus, vertigo resolved. Psych: Attention normal, affect normal, judgment insight appear normal    Data Reviewed: Labs notable for anemia stable 1.4, white blood cells stable at 2.9, hemoglobin 8.5, INR slightly up to 3.7 CT head unremarkable  Family Communication: Daughter Social research officer, government by phone yesterday evening  Disposition: Status is: Inpatient  Remains inpatient appropriate because: She has decompensated liver failure, will require ongoing IV antibiotics, monitoring of renal function, likely SNF          Author: Edwin Velazquez 01/17/2021 9:49 AM  For on call review [email or web address].

## 2021-01-18 DIAGNOSIS — D62 Acute posthemorrhagic anemia: Secondary | ICD-10-CM | POA: Diagnosis present

## 2021-01-18 LAB — CBC
HCT: 26.9 % — ABNORMAL LOW (ref 36.0–46.0)
Hemoglobin: 9.2 g/dL — ABNORMAL LOW (ref 12.0–15.0)
MCH: 30.9 pg (ref 26.0–34.0)
MCHC: 34.2 g/dL (ref 30.0–36.0)
MCV: 90.3 fL (ref 80.0–100.0)
Platelets: 48 10*3/uL — ABNORMAL LOW (ref 150–400)
RBC: 2.98 MIL/uL — ABNORMAL LOW (ref 3.87–5.11)
RDW: 15 % (ref 11.5–15.5)
WBC: 3 10*3/uL — ABNORMAL LOW (ref 4.0–10.5)
nRBC: 0 % (ref 0.0–0.2)

## 2021-01-18 LAB — COMPREHENSIVE METABOLIC PANEL
ALT: 24 U/L (ref 0–44)
AST: 36 U/L (ref 15–41)
Albumin: 3.1 g/dL — ABNORMAL LOW (ref 3.5–5.0)
Alkaline Phosphatase: 71 U/L (ref 38–126)
Anion gap: 4 — ABNORMAL LOW (ref 5–15)
BUN: 26 mg/dL — ABNORMAL HIGH (ref 8–23)
CO2: 23 mmol/L (ref 22–32)
Calcium: 8.3 mg/dL — ABNORMAL LOW (ref 8.9–10.3)
Chloride: 107 mmol/L (ref 98–111)
Creatinine, Ser: 1.55 mg/dL — ABNORMAL HIGH (ref 0.44–1.00)
GFR, Estimated: 35 mL/min — ABNORMAL LOW (ref >60)
Glucose, Bld: 103 mg/dL — ABNORMAL HIGH (ref 70–99)
Potassium: 4 mmol/L (ref 3.5–5.1)
Sodium: 134 mmol/L — ABNORMAL LOW (ref 135–145)
Total Bilirubin: 1.4 mg/dL — ABNORMAL HIGH (ref 0.3–1.2)
Total Protein: 5.3 g/dL — ABNORMAL LOW (ref 6.5–8.1)

## 2021-01-18 LAB — PROTIME-INR
INR: 2.9 — ABNORMAL HIGH (ref 0.8–1.2)
Prothrombin Time: 30.2 seconds — ABNORMAL HIGH (ref 11.4–15.2)

## 2021-01-18 NOTE — Progress Notes (Signed)
Progress Note   Patient: Stacey Velazquez SWN:[id number] DOB: 11/26/1945 DOA: 01/12/2021     5 DOS: the patient was seen and examined on 01/18/2021     Brief hospital course: Stacey Velazquez is a 75 y.o. F with cirrhosis due to AIH, SSS s/p PPM, CAD s/p remote PCI, CVA without residuals, CKD who presented with rectal bleeding, melena, and abdominal distension/BLE swelling. She was found to have a Hb drop from 10 > 7.    12/19: Admitted, GI consulted for GI bleeding, underwent paracentesis 3.5L fluid 12/20: EGD colon showed varices, gastritis but no sitgmata of recent bleeding, flex sig with stool, but no source identified, no melena in colon 12/21: Hgb down to 6.7 g/dL, transfused 1 unit; Liver doppler negative for thrombosis 12/22: Repeat paracentesis again 3L, no SBP again 12/23: CT head due to vertigo, diplopia, normal 12/24: Improving on steroids, mentation improved, renal function stable      Assessment and Plan * Ascites of liver- (present on admission) Esophageal varices determined by endoscopy ([place])- (present on admission) Autoimmune hepatitis ([place])- (present on admission) Completed 3d vitamin K. Discussed INR, vit K and diuretics with GI. - Continue prednisone 20, plan for no taper at d/c  - Continue Cipro for ppx, day 6 of 7 - Continue PPI, midodrine   - Hold further albumin for now - Consult GI, appreciate cares   Acute blood loss anemia Pancytopenia ([place])- (present on admission) Anemia associated with chemotherapy- (present on admission) Chronic blood loss anemia- (present on admission) Blood loss seems to have stopped. ANC >0.8 now Iron replete now, but with her varices and portal gastropathy, will lose blood. - Consult GI, appreciate cares - Continue ppi - Check iron studies in 2 months  -Hold Granix    AKI (acute kidney injury) ([place]) It seems her baseline Cr has been in the 0.9 mg/dL range, wtihin the last 6 months, has fluctuated a lot recently.  Was  recently up >2 mg/dL in setting of tacrolimus, more recently down to 1.2-1.3 here, and as high as 1.6 mg/dL.  HRS ruled out, nephrology consulted.  Given her liver disease 1.0-1.5 is probably within the range of her normal.   - Continue midodrine - Nephrology follow up needed at d/c    Acute metabolic encephalopathy- (present on admission) Patient noted to have sluggishness, decreased responsiveness.  This improved with albumin, paracentesis and starting steroids.  Hypothyroidism- (present on admission) -Continue levothyroxine  Hyponatremia- (present on admission) -Continue Fluid restriction  Vertigo CT head unremarkable.  Has had BPPV in the past. Vertigo now completely resolved.         Subjective: Patient continues to feel better.  Her swelling is getting a little bit worse again.  Although she does not notice this.  No fever, no further confusion.  No further melena or hematochezia.     Objective Vital signs were reviewed and unremarkable. General appearance: Elderly adult female, sitting up in recliner, eating breakfast, no acute distress.  Interactive.     HEENT: Partially edentulous, lips dry, no oral lesions, hearing normal Skin: No suspicious rashes or lesions. Cardiac: RRR, no murmurs, 1+ bilateral pitting edema bilaterally Respiratory: Respiratory rate and rhythm, Without rales or wheezes. Abdomen: Abdomen soft, mild distention, no tense ascites, no tenderness palpation. MSK:  Neuro: Awake alert, extraocular movements intact, face symmetric, speech fluent, no nystagmus, vertigo resolved. Psych: Attention normal, affect normal, judgment insight appear normal    Data Reviewed: Labs notable for creatinine stable at 1.5, sodium stable at  134, total bilirubin down to 1.4, white blood cell count up to 3, hemoglobin up to 9.2, INR down to 2.9  Family Communication:    Disposition: Status is: Inpatient  Remains inpatient appropriate because: She has  decompensated liver failure now improving on steroid therapy.  At this point, her medical management is coming to conclusion, she seems to be improving.  However she is significantly debilitated will need significant rehabilitation return to her prior level of function.  Plan for discharge to SNF when bed available.         Author: Edwin Dada 01/18/2021 10:43 AM  For on call review [email or web address].

## 2021-01-18 NOTE — Assessment & Plan Note (Addendum)
Hgb has been trending down, without clear melena. Suspect this is oozing from her GAVE, in setting of coagulopathy. -Continue PPI

## 2021-01-19 DIAGNOSIS — N179 Acute kidney failure, unspecified: Secondary | ICD-10-CM | POA: Diagnosis not present

## 2021-01-19 DIAGNOSIS — K7290 Hepatic failure, unspecified without coma: Secondary | ICD-10-CM

## 2021-01-19 DIAGNOSIS — K7460 Unspecified cirrhosis of liver: Secondary | ICD-10-CM | POA: Diagnosis not present

## 2021-01-19 DIAGNOSIS — I8500 Esophageal varices without bleeding: Secondary | ICD-10-CM | POA: Diagnosis not present

## 2021-01-19 LAB — CBC
HCT: 29.2 % — ABNORMAL LOW (ref 36.0–46.0)
Hemoglobin: 9.9 g/dL — ABNORMAL LOW (ref 12.0–15.0)
MCH: 30.7 pg (ref 26.0–34.0)
MCHC: 33.9 g/dL (ref 30.0–36.0)
MCV: 90.7 fL (ref 80.0–100.0)
Platelets: 46 10*3/uL — ABNORMAL LOW (ref 150–400)
RBC: 3.22 MIL/uL — ABNORMAL LOW (ref 3.87–5.11)
RDW: 15.2 % (ref 11.5–15.5)
WBC: 3.6 10*3/uL — ABNORMAL LOW (ref 4.0–10.5)
nRBC: 0 % (ref 0.0–0.2)

## 2021-01-19 LAB — PROTIME-INR
INR: 2.9 — ABNORMAL HIGH (ref 0.8–1.2)
Prothrombin Time: 30.5 seconds — ABNORMAL HIGH (ref 11.4–15.2)

## 2021-01-19 LAB — COMPREHENSIVE METABOLIC PANEL
ALT: 31 U/L (ref 0–44)
AST: 49 U/L — ABNORMAL HIGH (ref 15–41)
Albumin: 3 g/dL — ABNORMAL LOW (ref 3.5–5.0)
Alkaline Phosphatase: 76 U/L (ref 38–126)
Anion gap: 6 (ref 5–15)
BUN: 30 mg/dL — ABNORMAL HIGH (ref 8–23)
CO2: 21 mmol/L — ABNORMAL LOW (ref 22–32)
Calcium: 8.3 mg/dL — ABNORMAL LOW (ref 8.9–10.3)
Chloride: 106 mmol/L (ref 98–111)
Creatinine, Ser: 1.75 mg/dL — ABNORMAL HIGH (ref 0.44–1.00)
GFR, Estimated: 30 mL/min — ABNORMAL LOW (ref >60)
Glucose, Bld: 104 mg/dL — ABNORMAL HIGH (ref 70–99)
Potassium: 3.8 mmol/L (ref 3.5–5.1)
Sodium: 133 mmol/L — ABNORMAL LOW (ref 135–145)
Total Bilirubin: 1.2 mg/dL (ref 0.3–1.2)
Total Protein: 5.3 g/dL — ABNORMAL LOW (ref 6.5–8.1)

## 2021-01-19 LAB — BODY FLUID CULTURE W GRAM STAIN
Culture: NO GROWTH
Gram Stain: NONE SEEN

## 2021-01-19 LAB — SODIUM, URINE, RANDOM: Sodium, Ur: 36 mmol/L

## 2021-01-19 MED ORDER — ALBUMIN HUMAN 25 % IV SOLN
25.0000 g | Freq: Four times a day (QID) | INTRAVENOUS | Status: AC
  Administered 2021-01-19 – 2021-01-20 (×4): 25 g via INTRAVENOUS
  Filled 2021-01-19 (×5): qty 100

## 2021-01-19 NOTE — Progress Notes (Signed)
[facility] Progress Note  CC:  Autoimmune hepatitis  Subjective: She reported having a rough night last night, she was not allowed out of bed due to having vertigo which was quite frustrating to her.  No vertigo at this time.  No chest pain or palpitations.  She continues to have upper abdominal pain which is not severe.  No nausea or vomiting.  Appetite is good.  Last bowel movement was yesterday as reported by the patient and nursing staff.  No obvious rectal bleeding or melena.  No family at the bedside.   Objective:  Vital signs in last 24 hours: Temp:  [97.6 F (36.4 C)-97.8 F (36.6 C)] 97.8 F (36.6 C) (12/26 0549) Pulse Rate:  [67-85] 81 (12/26 0549) Resp:  [18-20] 20 (12/26 0549) BP: (124-140)/(73-109) 129/79 (12/26 0549) SpO2:  [100 %] 100 % (12/26 0549) Last BM Date: 01/18/21 General:   Alert ill appearing 75 year old female in no acute distress. Eyes: No scleral icterus. Heart: Rate and rhythm, no murmurs Pulm: Breath sounds clear throughout. Abdomen: Distended with ascites, abdomen is not tense.  Persistent serous drainage from prior RLQ paracentesis site, current dressing is saturated with serous fluid.  Moderate tenderness throughout the upper abdomen and periumbilical area without rebound or guarding.  Positive bowel sounds to all 4 quadrants. Extremities: Less lower extremity edema today. Neurologic:  Alert and  oriented x4.  Speech is clear.  Moves all extremities.  No asterixis. Psych:  Alert and cooperative. Normal mood and affect.  Intake/Output from previous day: 12/25 0701 - 12/26 0700 In: 520 [P.O.:120; IV Piggyback:400] Out: 500 [Urine:500] Intake/Output this shift: No intake/output data recorded.  Lab Results: Recent Labs    01/17/21 0406 01/18/21 0423 01/19/21 0402  WBC 2.9* 3.0* 3.6*  HGB 8.5* 9.2* 9.9*  HCT 25.3* 26.9* 29.2*  PLT 44* 48* 46*   BMET Recent Labs    01/17/21 0406 01/18/21 0423 01/19/21 0402  NA 136  134* 133*  K 4.0 4.0 3.8  CL 107 107 106  CO2 22 23 21*  GLUCOSE 119* 103* 104*  BUN 18 26* 30*  CREATININE 1.46* 1.55* 1.75*  CALCIUM 8.2* 8.3* 8.3*   LFT Recent Labs    01/19/21 0402  PROT 5.3*  ALBUMIN 3.0*  AST 49*  ALT 31  ALKPHOS 76  BILITOT 1.2   PT/INR Recent Labs    01/18/21 0423 01/19/21 0402  LABPROT 30.2* 30.5*  INR 2.9* 2.9*   Hepatitis Panel No results for input(s): HEPBSAG, HCVAB, HEPAIGM, HEPBIGM in the last 72 hours.  No results found.  Assessment / Plan:  43) 75 year old female with autoimmune hepatitis with decompensated cirrhosis, ascites, esophageal varices and pancytopenia admitted to the hospital 01/12/2021 with generalized abdominal pain, rectal bleeding and melena x 1 day. Started on PPI infusion and Octreotide infusion. Admission Hg 10.1 (baseline Hg 9 - 10.07) -> Hg 7.8 -> 6.7 on 12/21 transfused 1 unit of PRBCs -> Hg 9.0 ->  Hg 8.8. CTAP wo contrast showed cirrhosis with marked ascites. S/P paracentesis 12/19, 3.5 L of peritoneal fluid removed. No SBP. Repeat paracentesis 12/22, 3.5L peritoneal fluid removed, no SBP.  Cytology pending. No overt hepatic encephalopathy. Off Prograf since 11/06/2020. Stable LFTs. IgG level 3,077 -> 1,593. Liver doppler 12/21 showed ascites, no evidence of hepatic or portal vein thrombosis. MELD Na 26 based on today's labs. Patient is not a liver transplant candidate. S/P EGD 12/21 showed grade II esophageal varices without stigmata of recent bleeding,  gastritis and portal hypertensive gastropathy. Gastric biopsies negative for H. Pylori. Octreotide dc'd. Flex sig 12/20 showed brown stool in the rectum, sigmoid, descending and transverse colon, multiple nonbleeding polyps were seen in the sigmoid, descending and transverse colon (left insitu) and diverticulosis was present throughout the examined colon.  Passing normal brown-colored stools. Started on Prednisone 20mg  QD on 12/2 -Continue Prednisone 20 mg p.o (she previously  developed leukopenia on azathioprine, she failed remission on Prograf and is not a candidate for MMF).  She is not a liver transplant candidate as confirmed by [facility] otology NP [place] -Repeat IgG level 12/30 -Continue 2 g low-sodium diet -Agree with palliative care consult -Await further recommendations per Dr. Rush Landmark   2) Pancytopenia. WBC 1.0 -> 2.8. Hg 7.8 -> 6.7 -> transfused 1 unit of PRBCs 12.21 -> Hg 9.0 -> 8.8 -> 9.9. PLT 37 -> transfused 1 pack of platelets 12/20 prior EGD/flex sig -> today PLT count 46. Iron 45. B12 666.  No active GI bleeding per endoscopic evaluation. CTAP without evidence of splenomegaly. Seen by hematology, pancytopenia likely due to her autoimmune hepatitis superimposed by bone marrow suppression. Received Granix per hematology -> today WBC 3.6.  -Appreciate hematology recommendation   3) Coagulopathy secondary to cirrhosis, improving. INR 1.2.  -Daily INR   4) GERD, history of gastric ulcers -Continue PPI po QD   5) CKD. Rising creatinine level 1.55 -> 1.75. Seen by nephrology, unlikely HRS with urine Na+ 41. Previously received IV Albumin. On Midodrine. Rising Cr 1.75. -Albumin IV 25gm Q 6 hrs -Await further recommendations from nephrology  6) Hyponatremia secondary to cirrhosis    7) History of adenomatous and tubulovillous colon polyps. Flex sig done during this hospitalization showed multiple polyps in the transverse and left colon, left insitu due to high risk of bleeding -Follow up colonoscopy as an outpatient      Principal Problem:   Ascites of liver Active Problems:   Pancytopenia ([place])   Hypothyroidism   Vertigo   Autoimmune hepatitis (HCC)   Decompensated hepatic cirrhosis (HCC)   Esophageal varices determined by endoscopy ([place])   Portal hypertensive gastropathy (HCC)   Hyponatremia   Chronic blood loss anemia   Anemia associated with chemotherapy   Neutropenia, drug-induced (HCC)   AKI (acute kidney injury) ([place])   Acute  metabolic encephalopathy   Acute blood loss anemia     LOS: 6 days   Noralyn Pick  01/19/2021, 10:30AM

## 2021-01-19 NOTE — Progress Notes (Signed)
Physical Therapy Treatment Patient Details Name: ARLOA PRAK MRN: [id number] DOB: 1945/06/09 Today's Date: 01/19/2021   History of Present Illness Pt is a 75 y.o. female who presented to [facility] ED with progressively worsening abdominal distention for the past month associated with worsening dyspnea, lower extremity edema and abdominal pain that excalated. Associated with few days of melena and hematochezia.  No nausea or vomiting. Paracentisis done on 01/12/2021 with 3.5 L of fluid removed by IR.  Abdominal fluid not suggestive of SBP. PMH significant for  autoimmune hepatitis,  with abdominal ascites, anemia of chronic disease, colon polyps, anxiety, bilateral lower extremity edema, bipolar disorder with insomnia, cutaneous horn, drug-induced parkinsonism, esophageal varices, fibromyalgia, gastric ulcer, GERD, gingival abscess, glaucoma, CAD, history of MI, hypothyroidism, migraine headaches, neutropenia, osteoporosis, paranoid schizophrenia, polyarthritis, rectal mass, seizure disorder, sinus arrest, sick sinus syndrome, pacemaker placement, history of nonhemorrhagic stroke, thrombocytopenia    PT Comments    Patient received in recliner, very pleasant but extremely talkative requiring frequent redirection for time management/staying on task during session. Needed multiple attempts, ultimately able to transfer from bed to chair with ModA but generally very unsafe with rollator (refused to use any other device however). Reported multiple bouts of "vertigo" during session but refused to allow me to perform vestibular eval- states "I want a doctor to do it so other doctors can see the write up" despite education on PT education and scope/vestibular training. Left in bed with all needs met, bed alarm active. Continue to recommend SNF.   Recommendations for follow up therapy are one component of a multi-disciplinary discharge planning process, led by the attending physician.  Recommendations may be updated  based on patient status, additional functional criteria and insurance authorization.  Follow Up Recommendations  Skilled nursing-short term rehab (<3 hours/day)     Assistance Recommended at Discharge Frequent or constant Supervision/Assistance  Equipment Recommendations  None recommended by PT    Recommendations for Other Services       Precautions / Restrictions Precautions Precautions: Fall Precaution Comments: dizziness Restrictions Weight Bearing Restrictions: No     Mobility  Bed Mobility Overal bed mobility: Needs Assistance Bed Mobility: Sit to Supine;Rolling Rolling: Modified independent (Device/Increase time)     Sit to supine: Min guard   General bed mobility comments: increased time for all bed mobility tasks, reliant on bed rail    Transfers Overall transfer level: Needs assistance Equipment used: Rollator (4 wheels) Transfers: Sit to/from Stand;Bed to chair/wheelchair/BSC Sit to Stand: Mod assist Stand pivot transfers: Mod assist         General transfer comment: ModA and multiple attempts to boost all the way up to standing, also ModA/increased time to pivot hips over to bed    Ambulation/Gait               General Gait Details: unable   Stairs             Wheelchair Mobility    Modified Rankin (Stroke Patients Only)       Balance Overall balance assessment: Needs assistance Sitting-balance support: Feet supported;Bilateral upper extremity supported Sitting balance-Leahy Scale: Fair     Standing balance support: Bilateral upper extremity supported;Reliant on assistive device for balance Standing balance-Leahy Scale: Poor                              Cognition Arousal/Alertness: Awake/alert Behavior During Therapy: WFL for tasks assessed/performed Overall Cognitive Status: Within Functional Limits  for tasks assessed                                 General Comments: A&Ox4 but very poor  insight into safety        Exercises      General Comments        Pertinent Vitals/Pain Pain Assessment: Faces Faces Pain Scale: Hurts even more Pain Location: generalized with mobility Pain Descriptors / Indicators: Discomfort;Sore Pain Intervention(s): Limited activity within patient's tolerance;Monitored during session;Repositioned    Home Living                          Prior Function            PT Goals (current goals can now be found in the care plan section) Acute Rehab PT Goals Patient Stated Goal: Have patience and be able to improve movement with less dizziness PT Goal Formulation: With patient Time For Goal Achievement: 01/29/21 Potential to Achieve Goals: Good Progress towards PT goals: Progressing toward goals    Frequency    Min 3X/week      PT Plan Current plan remains appropriate    Co-evaluation              AM-PAC PT "6 Clicks" Mobility   Outcome Measure  Help needed turning from your back to your side while in a flat bed without using bedrails?: None Help needed moving from lying on your back to sitting on the side of a flat bed without using bedrails?: A Little Help needed moving to and from a bed to a chair (including a wheelchair)?: A Little Help needed standing up from a chair using your arms (e.g., wheelchair or bedside chair)?: A Little Help needed to walk in hospital room?: A Lot Help needed climbing 3-5 steps with a railing? : Total 6 Click Score: 16    End of Session Equipment Utilized During Treatment: Gait belt Activity Tolerance: Patient tolerated treatment well Patient left: in bed;with call bell/phone within reach;with bed alarm set Nurse Communication: Mobility status PT Visit Diagnosis: Pain;Other abnormalities of gait and mobility (R26.89) Pain - Right/Left:  (bilateral) Pain - part of body:  (generlized, L >R)     Time: 8850-2774 PT Time Calculation (min) (ACUTE ONLY): 26 min  Charges:   $Therapeutic Activity: 23-37 mins                    Windell Norfolk, DPT, PN2   Supplemental Physical Therapist [place]    Pager [phone] Acute Rehab Office [phone]

## 2021-01-19 NOTE — Plan of Care (Signed)

## 2021-01-19 NOTE — Assessment & Plan Note (Addendum)
Trending down, in the 7s today.  No melena.

## 2021-01-19 NOTE — Progress Notes (Signed)
Progress Note   Patient: Stacey Velazquez OAC:[id number] DOB: Jan 11, 1946 DOA: 01/12/2021     6 DOS: the patient was seen and examined on 01/19/2021      Brief hospital course: Stacey Velazquez is a 75 y.o. F with cirrhosis due to AIH, SSS s/p PPM, CAD s/p remote PCI, CVA without residuals, CKD who presented with rectal bleeding, melena, and abdominal distension/BLE swelling.   In the ER, she was found to have a Hb drop from 10 > 7, elevated INR and ascites.   12/19: Admitted, GI consulted for GI bleeding, underwent paracentesis 3.5L fluid 12/20: EGD colon showed varices, gastritis but no sitgmata of recent bleeding, flex sig with stool, but no source identified, no melena in colon 12/21: Hgb down to 6.7 g/dL, transfused 1 unit; Liver doppler negative for thrombosis, mentation worsening --> albumin started 12/22: Repeat paracentesis again 3L, no SBP again, mentation and renal function improved with albumin, vit K 12/23: Steroids started 12/24-26: Improving on steroids      Assessment and Plan * Ascites of liver- (present on admission) Esophageal varices determined by endoscopy ([place])- (present on admission) Autoimmune hepatitis ([place])- (present on admission) Completed 3d vitamin K, 7d Cipro for SBP PPx   Discussed with GI.  - Continue prednisone 20, plan for no taper at d/c - Continue PPI, midodrine  - Hold diuretics until Cr trend clearer  - Hold further albumin for now - Consult GI, appreciate cares      AKI (acute kidney injury)  It seems her baseline Cr has been in the 0.9 mg/dL range, wtihin the last 6 months, has fluctuated a lot recently.  Was recently up >2 mg/dL in setting of tacrolimus, more recently down to 1.2-1.3 here, and as high as 1.6 mg/dL.  HRS ruled out, nephrology consulted.  Given her liver disease 1.0-1.5 is probably within the range of her normal.    Trend back up to 1.7 in last 3 days - Continue midodrine - Nephrology follow up needed at  d/c    Pancytopenia ([place])- (present on admission) Drug-induced neutropenia ANC down to <500 at one point.  Heme consulted, felt her pancytopenias were multifactorial from bone marrow suppression, splenic sequestration, blood loss. Granix given, ANC recovered.     Acute blood loss anemia- (present on admission) Anemia associated with chemotherapy- (present on admission) Chronic blood loss anemia- (present on admission) Iron replete now, but with her varices and portal gastropathy, will lose blood. Hgb stable today, no clinical bleeding - Consult GI, appreciate cares -Continue ppi - Check iron studies in 2 months     Hypothyroidism- (present on admission) -Continue levothyroxine  Hyponatremia- (present on admission) -Continue Fluid restriction       Subjective: Stomach sore after perceived poor treatment by nursing last night.  No fever, no confusion.  Objective Vital signs were reviewed and unremarkable. General appearance: Elderly adult female, sitting up in recliner, eating breakfast, no acute distress.  Interactive.     HEENT: Partially edentulous, lips dry, no oral lesions, hearing normal Skin: No suspicious rashes or lesions. Cardiac: RRR, no murmurs, 1+ bilateral pitting edema bilaterally Respiratory: Respiratory rate and rhythm, Without rales or wheezes. Abdomen: Abdomen soft, mild distention, no tense ascites, no tenderness palpation. MSK:  Neuro: Awake alert, extraocular movements intact, face symmetric, speech fluent, no nystagmus, vertigo resolved. Psych: Attention normal, affect normal, judgment insight appear normal  Data Reviewed: BMP and CBC, reviewed above  Family Communication:   Disposition: Status is: Inpatient  Remains inpatient appropriate because:  she will require placmenet for rehab, this is pending          Author: Edwin Velazquez 01/19/2021 9:22 AM  For on call review [email or web address].

## 2021-01-20 DIAGNOSIS — R188 Other ascites: Secondary | ICD-10-CM | POA: Diagnosis not present

## 2021-01-20 DIAGNOSIS — D6481 Anemia due to antineoplastic chemotherapy: Secondary | ICD-10-CM

## 2021-01-20 DIAGNOSIS — Z515 Encounter for palliative care: Secondary | ICD-10-CM

## 2021-01-20 DIAGNOSIS — D689 Coagulation defect, unspecified: Secondary | ICD-10-CM | POA: Diagnosis present

## 2021-01-20 DIAGNOSIS — Z7189 Other specified counseling: Secondary | ICD-10-CM

## 2021-01-20 DIAGNOSIS — K754 Autoimmune hepatitis: Secondary | ICD-10-CM | POA: Diagnosis not present

## 2021-01-20 DIAGNOSIS — D500 Iron deficiency anemia secondary to blood loss (chronic): Secondary | ICD-10-CM | POA: Diagnosis not present

## 2021-01-20 DIAGNOSIS — D61818 Other pancytopenia: Secondary | ICD-10-CM

## 2021-01-20 LAB — BASIC METABOLIC PANEL
Anion gap: 7 (ref 5–15)
BUN: 37 mg/dL — ABNORMAL HIGH (ref 8–23)
CO2: 24 mmol/L (ref 22–32)
Calcium: 8.5 mg/dL — ABNORMAL LOW (ref 8.9–10.3)
Chloride: 106 mmol/L (ref 98–111)
Creatinine, Ser: 1.43 mg/dL — ABNORMAL HIGH (ref 0.44–1.00)
GFR, Estimated: 38 mL/min — ABNORMAL LOW (ref >60)
Glucose, Bld: 103 mg/dL — ABNORMAL HIGH (ref 70–99)
Potassium: 3.8 mmol/L (ref 3.5–5.1)
Sodium: 137 mmol/L (ref 135–145)

## 2021-01-20 LAB — CBC
HCT: 23.2 % — ABNORMAL LOW (ref 36.0–46.0)
Hemoglobin: 7.9 g/dL — ABNORMAL LOW (ref 12.0–15.0)
MCH: 30.6 pg (ref 26.0–34.0)
MCHC: 34.1 g/dL (ref 30.0–36.0)
MCV: 89.9 fL (ref 80.0–100.0)
Platelets: 41 10*3/uL — ABNORMAL LOW (ref 150–400)
RBC: 2.58 MIL/uL — ABNORMAL LOW (ref 3.87–5.11)
RDW: 15.2 % (ref 11.5–15.5)
WBC: 1.8 10*3/uL — ABNORMAL LOW (ref 4.0–10.5)
nRBC: 0 % (ref 0.0–0.2)

## 2021-01-20 LAB — CULTURE, BLOOD (ROUTINE X 2)
Culture: NO GROWTH
Culture: NO GROWTH
Special Requests: ADEQUATE
Special Requests: ADEQUATE

## 2021-01-20 LAB — CYTOLOGY - NON PAP

## 2021-01-20 LAB — PROTIME-INR
INR: 3.4 — ABNORMAL HIGH (ref 0.8–1.2)
Prothrombin Time: 34.7 seconds — ABNORMAL HIGH (ref 11.4–15.2)

## 2021-01-20 LAB — SODIUM, URINE, RANDOM: Sodium, Ur: 66 mmol/L

## 2021-01-20 MED ORDER — PROSOURCE PLUS PO LIQD
30.0000 mL | Freq: Three times a day (TID) | ORAL | Status: DC
  Administered 2021-01-20 – 2021-01-23 (×9): 30 mL via ORAL
  Filled 2021-01-20 (×9): qty 30

## 2021-01-20 NOTE — Progress Notes (Signed)
Progress Note   Patient: Stacey Velazquez UDJ:[id number] DOB: 1945-06-29 DOA: 01/12/2021     7 DOS: the patient was seen and examined on 01/20/2021       Brief hospital course: Mrs. Deanda is a 75 y.o. F with cirrhosis due to AIH, SSS s/p PPM, CAD s/p remote PCI, CVA without residuals, CKD who presented with rectal bleeding, melena, and abdominal distension/BLE swelling.   In the ER, she was found to have a Hb drop from 10 > 7, elevated INR and ascites.       12/19: Admitted, GI consulted for GI bleeding, underwent paracentesis 3.5L fluid 12/20: EGD colon showed varices, gastritis but no sitgmata of recent bleeding, flex sig with stool, but no source identified, no melena in colon 12/21: Hgb down to 6.7 g/dL, transfused 1 unit; Liver doppler negative for thrombosis, mentation worsening --> albumin started 12/22: Repeat paracentesis again 3L, no SBP again, mentation and renal function improved with albumin, vit K 12/23: Steroids started 12/24-27: Improving on steroids but Hgb trending down         Assessment and Plan * Autoimmune hepatitis ([place])- (present on admission) Esophageal varices determined by endoscopy ([place])- (present on admission) Ascites of liver- (present on admission) Decompensated hepatic cirrhosis Coagulopathy due to Cirrhosis  Initially admitted for bleeding, but now decompensated cirrhosis prevents discharge.  MELD 27 Completed 3d vitamin K, 7d Cipro for SBP PPx  Discussed with Palliative Care today  - Continue prednisone 20, plan for no taper at d/c - Continue PPI, midodrine  - Hold diuretics until Cr trend clearer  - Consult GI, appreciate cares - Consult Palliative Care        Acute on chronic blood loss anemia  Anemia associated with chemotherapy  Chronic blood loss anemia  Iron replete  Trending down, in the 7s today.  No melena.  -Continue ppi - Check iron studies in 2 months  Pancytopenia ([place])- (present on  admission) Multifactorial.  ANC down to <500 at one point.  Heme consulted, Granix given, ANC recovered. Neutropenia largely sequestration, drug induced, BM failure.  Anemia largely chronic blood loss.   Thrombocytopenia largely sequestration.  Transfused 1 unit platelets on 12/20 and 1 unit PRBCs on 12/21, none since    AKI (acute kidney injury) ([place]) It seems her baseline Cr has been in the 0.9 mg/dL range, wtihin the last 6 months, has fluctuated a lot recently.  Was recently up >2 mg/dL in setting of tacrolimus, more recently down to 1.2-1.3 here, and as high as 1.6 mg/dL.  HRS ruled out, nephrology consulted.  Cr better at 1.4 today.  Urine sodium low again. Given her liver disease 1.0-1.5 is probably within the range of her normal.    - Continue midodrine - Nephrology follow up needed at d/c     Hypothyroidism- (present on admission) -Continue levothyroxine    Resolved issues: Hyponatremia- (present on admission) -Continue Fluid restriction  Acute metabolic encephalopathy- (present on admission) Patient noted to have sluggishness, decreased responsiveness earlier in hospital stay.  This resolved with albumin, paracentesis and starting steroids.  Now at baseline.  Vertigo CT head unremarkable.  Has had BPPV in the past. Vertigo now completely resolved.       Subjective: No melena.  No fever.  No confusion.  No dysuria, no cough.  1 soft brown bowel movement overnight.  Objective Vital signs were reviewed and unremarkable. General appearance: Elderly adult female, sitting up in bed, interactive, no acute distress     HEENT: Partially  edentulous, lips dry, no oral lesions, hearing normal Skin: No suspicious rashes or lesions, no icterus, no jaundice Cardiac: RRR, no murmurs, 1+ bilateral nonpitting edema, bilaterally Respiratory: Normal respiratory rate and rhythm, lungs clear without rales or wheezes Abdomen: Abdomen soft, mild distention and ascites, not tense,  no tenderness. MSK:  Neuro: Awake and alert, extraocular movements intact, face symmetric, speech fluent, keeps the right eye closed because of her diplopia.  [name]. Psych: Attention normal, affect normal, judgment insight appear normal   Data Reviewed: Review of the basic metabolic panel and complete blood count is reviewed above, INR trending back up.  Family Communication: Daughter [name] at the bedside  Disposition: Status is: Inpatient  Remains inpatient appropriate because:   Her hemoglobin and INR are trending in the wrong direction.    I suspect with her portal gastropathy combined with coagulopathy, she will not be able to produce blood fast enough to replace what is lost.    TIPS to reduce portal gastropathy would risk HE in this elderly patient with some mild cognitive impairment already.  She is not a transplant candidate, and if her blood loss, coagulaopathy, and renal disease do not improve with steroids and supportive cares, it may be that she needs to return home with hospice soon.  At present, her best hope is that things stabilize, she can rehab and then discharge to home with some improvement of function, to spend some time with family.             Author: Edwin Dada 01/20/2021 7:58 PM  For on call review [email or web address].

## 2021-01-20 NOTE — Care Management Important Message (Signed)
Important Message  Patient Details IM Letter given to the Patient. Name: Stacey Velazquez MRN: [id number] Date of Birth: 1945-10-20   Medicare Important Message Given:  Yes     Kerin Salen 01/20/2021, 11:33 AM

## 2021-01-20 NOTE — Progress Notes (Signed)
Occupational Therapy Treatment Patient Details Name: Stacey Velazquez MRN: [id number] DOB: February 12, 1945 Today's Date: 01/20/2021   History of present illness Pt is a 75 y.o. female who presented to [facility] ED with progressively worsening abdominal distention for the past month associated with worsening dyspnea, lower extremity edema and abdominal pain that excalated. Associated with few days of melena and hematochezia.  No nausea or vomiting. Paracentisis done on 01/12/2021 with 3.5 L of fluid removed by IR.  Abdominal fluid not suggestive of SBP. PMH significant for  autoimmune hepatitis,  with abdominal ascites, anemia of chronic disease, colon polyps, anxiety, bilateral lower extremity edema, bipolar disorder with insomnia, cutaneous horn, drug-induced parkinsonism, esophageal varices, fibromyalgia, gastric ulcer, GERD, gingival abscess, glaucoma, CAD, history of MI, hypothyroidism, migraine headaches, neutropenia, osteoporosis, paranoid schizophrenia, polyarthritis, rectal mass, seizure disorder, sinus arrest, sick sinus syndrome, pacemaker placement, history of nonhemorrhagic stroke, thrombocytopenia   OT comments  Patient progressing slowly and showed improved ability to use UEs functionally for self feeding, but refused any other ADLs this visit, compared to previous session. Pt reports that she has not brushed her teeth since her hospitalization but adamantly refused to initiate due to her preferred electric toothbrush being at home.  Hospital toothbrush and toothpaste available and pt educated on importance of oral hygiene but continued to refuse.  Patient remains self-limited by having a certain and at times, unsafe method of mobilizing and shows reticence in accepting recommendations.  Further limitations include generalized weakness and decreased activity tolerance along with pain and deficits noted below. Pt continues to demonstrate fair to good rehab potential and would benefit from continued skilled  OT to increase safety and independence with ADLs and functional transfers to allow pt to return home safely and reduce caregiver burden and fall risk.    Recommendations for follow up therapy are one component of a multi-disciplinary discharge planning process, led by the attending physician.  Recommendations may be updated based on patient status, additional functional criteria and insurance authorization.    Follow Up Recommendations  Skilled nursing-short term rehab (<3 hours/day)    Assistance Recommended at Discharge Frequent or constant Supervision/Assistance  Equipment Recommendations       Recommendations for Other Services      Precautions / Restrictions Precautions Precautions: Fall Precaution Comments: dizziness vs vertigo Restrictions Weight Bearing Restrictions: No       Mobility Bed Mobility Overal bed mobility: Needs Assistance Bed Mobility: Sit to Sidelying   Sidelying to sit: Supervision;HOB elevated (See ADLs section for description)            Transfers Overall transfer level: Needs assistance Equipment used: Rolling walker (2 wheels) (No Rollator in room) Transfers: Sit to/from Stand;Bed to chair/wheelchair/BSC Sit to Stand: Max assist Stand pivot transfers: Mod assist               Balance Overall balance assessment: Needs assistance Sitting-balance support: Feet supported;Bilateral upper extremity supported Sitting balance-Leahy Scale: Fair     Standing balance support: Bilateral upper extremity supported;Reliant on assistive device for balance Standing balance-Leahy Scale: Poor Standing balance comment: Stooped over                           ADL either performed or assessed with clinical judgement   ADL         Grooming Details (indicate cue type and reason): Pt refused ADLs despite education on importance of oral hygiene and encouragement. Pt refusing any self care tasks.  Toilet Transfer:  Stand-pivot;Moderate assistance;Cueing for sequencing;Maximal assistance Toilet Transfer Details (indicate cue type and reason): Pt stood from recliner to 2 wheeled RW with Maximum assist with increased time and effort, very stooped. Pt unable to push fromc hair, and needds to pull up on RW, which was secured to floor for pt.  Pivoted recliner to EOB with Moderate assist.         Functional mobility during ADLs: Moderate assistance;Maximal assistance;Rolling walker (2 wheels) General ADL Comments: significantly increased time for all tasks; redirection often needed    Extremity/Trunk Assessment Upper Extremity Assessment Upper Extremity Assessment:  (Pt on phone and unable to redirect to test. Pt was able to show ROM of shoulders to 90 degrees but no higher while on EOB.)       Cervical / Trunk Assessment Cervical / Trunk Assessment:  (Very stooped over in standing)    Vision Baseline Vision/History: 1 Wears glasses Ability to See in Adequate Light: 2 Moderately impaired     Perception     Praxis      Cognition Arousal/Alertness: Awake/alert Behavior During Therapy: WFL for tasks assessed/performed Overall Cognitive Status: No family/caregiver present to determine baseline cognitive functioning                                 General Comments: A&Ox4 but very poor insight into safety and does not follow instructions for safety. Talkative and needs redirection.  Pt answered phone in middle of session and began talking about books for extended period of time.          Exercises     Shoulder Instructions       General Comments      Pertinent Vitals/ Pain       Pain Assessment: PAINAD Breathing: occasional labored breathing, short period of hyperventilation Negative Vocalization: occasional moan/groan, low speech, negative/disapproving quality Facial Expression: sad, frightened, frown Body Language: tense, distressed pacing, fidgeting Consolability:  distracted or reassured by voice/touch PAINAD Score: 5 Pain Location: LLE when moving from sit to supine. Pt ADMANTLY refused assistance to help with pain stating help makes the pain worse. Pt struggled and eventually ale to raise LEs onto bed on 3rd attempt by clamping BLEs together for RT LE to help LT LE. Pain Descriptors / Indicators: Discomfort;Sore;Shooting;Stabbing Pain Intervention(s): Limited activity within patient's tolerance;Monitored during session;Repositioned  Home Living                                          Prior Functioning/Environment              Frequency           Progress Toward Goals  OT Goals(current goals can now be found in the care plan section)  Progress towards OT goals: Progressing toward goals  Acute Rehab OT Goals Patient Stated Goal: "get better" OT Goal Formulation: With patient Time For Goal Achievement: 01/28/21 Potential to Achieve Goals: Good  Plan      Co-evaluation                 AM-PAC OT "6 Clicks" Daily Activity     Outcome Measure   Help from another person eating meals?: None Help from another person taking care of personal grooming?: A Little Help from another person toileting, which includes using toliet, bedpan, or urinal?:  A Lot Help from another person bathing (including washing, rinsing, drying)?: A Lot Help from another person to put on and taking off regular upper body clothing?: A Lot Help from another person to put on and taking off regular lower body clothing?: A Lot 6 Click Score: 15    End of Session Equipment Utilized During Treatment: Gait belt;Rolling walker (2 wheels) (4 wheeled RW not available this vsit)  OT Visit Diagnosis: Unsteadiness on feet (R26.81);Muscle weakness (generalized) (M62.81);Other abnormalities of gait and mobility (R26.89)   Activity Tolerance Patient limited by pain   Patient Left in bed;with call bell/phone within reach;with bed alarm set   Nurse  Communication Mobility status        Time: 4196-2229 OT Time Calculation (min): 39 min  Charges: OT General Charges $OT Visit: 1 Visit OT Treatments $Therapeutic Activity: 38-52 mins  Stacey Velazquez, [place] Office: [phone] 01/20/2021 Julien Girt 01/20/2021, 12:33 PM

## 2021-01-20 NOTE — Progress Notes (Signed)
Initial Nutrition Assessment  DOCUMENTATION CODES:  Not applicable  INTERVENTION:  Discontinue Ensure daily.  Increase ProSource Plus from BID to TID.  Encourage PO and supplement intake.  NUTRITION DIAGNOSIS:  Increased nutrient needs related to acute illness as evidenced by estimated needs. - ongoing  GOAL:  Patient will meet greater than or equal to 90% of their needs. - meeting consistently  MONITOR:  PO intake, Supplement acceptance, Labs, Weight trends  REASON FOR ASSESSMENT:  Malnutrition Screening Tool    ASSESSMENT:  74 y.o. female with medical history of colon polyps, anxiety, BLE edema, bipolar disorder, insomnia, cutaneous horn, drug-induced Parkinsonism, esophageal varices, fibromyalgia, gastric ulcer, GERD, gingival abscess, glaucoma, CAD, hx of MI, hypothyroidism, migraine headaches, neutropenia, osteoporosis, paranoid schizophrenia, polyarthritis, rectal mass, seizure disorder, sick sinus syndrome, pacemaker placement, non-hemorrhagic stroke, and thrombocytopenia. She presented to the ED due to progressively worsening abdominal distention x1 month, worsening dyspnea, BLE edema, and abdominal pain. 12/20 - EGD, flexible sigmoidoscopy, biopsy 12/22 - paracentesis (yield of 3.5 L yellow fluid)  Per Epic, pt eating an average of 92% of the last 8 meals.   Pt refusing Ensure. RD to discontinue. Pt however likes ProSource Plus, RD to increase to TID.  Admit wt: 80.3 kg Current wt: 85.9 kg  RD to order new weight due tomorrow.  Of note, pt with moderate BLE edema.  Supplements: Ensure daily, ProSource Plus BID  Medications: reviewed; Synthroid, Protonix BID, miralax, prednisone  Labs: reviewed; Glucose 103 (H), BUN 37 (H - trending up), Crt 1.43 (H - trending down)  Diet Order:   Diet Order             Diet 2 gram sodium Room service appropriate? Yes; Fluid consistency: Thin; Fluid restriction: 1500 mL Fluid  Diet effective now                   EDUCATION NEEDS:  No education needs have been identified at this time  Skin:  Skin Assessment: Reviewed RN Assessment  Last BM:  01/20/21 - Type 6, smear  Height:  Ht Readings from Last 1 Encounters:  01/14/21 5\' 9"  (1.753 m)   Weight:  Wt Readings from Last 1 Encounters:  01/14/21 85.9 kg   BMI:  Body mass index is 27.97 kg/m.  Estimated Nutritional Needs:  Kcal:  1720-1975 kcal Protein:  90-100 grams Fluid:  >/= 1.8 L/day  Derrel Nip, RD, LDN (she/her/hers) Clinical Inpatient Dietitian RD Pager/After-Hours/Weekend Pager # in [place]

## 2021-01-20 NOTE — NC FL2 (Signed)
[place] LEVEL OF CARE SCREENING TOOL     IDENTIFICATION  Patient Name: Stacey Velazquez Birthdate: 04-29-1945 Sex: female Admission Date (Current Location): 01/12/2021  [facility] and [place] Number:  Herbalist and Address:  [facility],  [place], [place]      Provider Number: [id number]  Attending Physician Name and Address:  Edwin Dada, *  Relative Name and Phone Number:  [name],[name] Daughter [phone]  [phone]    Current Level of Care: Hospital Recommended Level of Care: [place] Prior Approval Number:    Date Approved/Denied:   PASRR Number: [id number]  Discharge Plan: SNF    Current Diagnoses: Patient Active Problem List   Diagnosis Date Noted   Acute blood loss anemia 01/18/2021   AKI (acute kidney injury) ([place]) 71/69/6789   Acute metabolic encephalopathy 38/10/1749   Neutropenia, drug-induced ([place])    Anemia associated with chemotherapy 01/14/2021   Ascites of liver 01/12/2021   Chronic blood loss anemia 01/12/2021   Ceruminosis, left 12/16/2020   Hyponatremia 11/04/2020   Need for influenza vaccination 11/04/2020   Otitis externa 09/11/2020   Bilateral hearing loss 09/11/2020   Portal hypertensive gastropathy ([place]) 04/21/2020   Acute esophagitis    Basal cell carcinoma of scalp 03/24/2020   Onychomycosis 03/24/2020   Pedal edema 03/24/2020   Esophageal varices determined by endoscopy ([place]) 07/03/2019   Limited mobility 05/14/2019   Adenomatous polyp of rectum s/p TEM partial proctectomy 05/09/2019 05/10/2019   Decompensated hepatic cirrhosis ([place]) 04/09/2019   Obesity, Class II, BMI 35-39.9 04/09/2019   Hallucinations 03/13/2019   Rectal bleeding 03/05/2019   Vertigo    Heart disease    GERD (gastroesophageal reflux disease)    Autoimmune hepatitis ([place])    Anxiety    Low back pain 10/18/2016   Acute right hip pain 08/26/2016   Seizure disorder ([place])  10/14/2014   Drug-induced Parkinson's disease ([place]) 10/14/2014   Hypothyroidism 09/09/2014   Pancytopenia ([place]) 08/25/2014   Thrombocytopenia due to drugs 08/25/2014   Syncope    Sinus arrest    Pacemaker Medtronic    Bipolar 1 disorder ([place])    Paranoid schizophrenia ([place]) 03/25/2012   Glaucoma 01/25/2001    Orientation RESPIRATION BLADDER Height & Weight     Self, Time, Situation, Place  Normal Continent Weight: 85.9 kg Height:  5\' 9"  (175.3 cm)  BEHAVIORAL SYMPTOMS/MOOD NEUROLOGICAL BOWEL NUTRITION STATUS      Incontinent Diet (Low Sodium)  AMBULATORY STATUS COMMUNICATION OF NEEDS Skin   Extensive Assist Verbally Skin abrasions (Abrasion, Ecchymosis, Head, Bilateral Anterior Arms)                       Personal Care Assistance Level of Assistance  Feeding, Bathing, Dressing Bathing Assistance: Limited assistance Feeding assistance: Independent Dressing Assistance: Limited assistance     Functional Limitations Info  Sight, Hearing, Speech Sight Info: Impaired Hearing Info: Impaired Speech Info: Adequate    SPECIAL CARE FACTORS FREQUENCY  PT (By licensed PT), OT (By licensed OT)     PT Frequency: x5 week OT Frequency: x5 week            Contractures Contractures Info: Not present    Additional Factors Info  Code Status, Allergies Code Status Info: FULL Allergies Info: Misc. Throat Products, Neomycin, Oxycodone-acetaminophen, Ambien (Zolpidem Tartrate), Amitriptyline, Ampicillin, Anaprox (Naproxen Sodium), Benadryl (Diphenhydramine Hcl (Sleep)), Cetirizine & Related, Cortizone-10 (Hydrocortisone), Darvon (Propoxyphene), Diazepam, Diflunisal, Duloxetine, Flexeril (Cyclobenzaprine), Lactose  Intolerance (Gi), Lidocaine, Meperidine And Related, Metanx (L-methylfolate-algae-b12-b6), Metoclopramide, Morphine And Related, Nuprin (Ibuprofen), Oxycodone, Penicillins, Propoxyphene, Ranitidine, Vicodin (Hydrocodone-acetaminophen), Butenafine Hcl, Tramadol, Tylenol  (Acetaminophen), Zolpidem           Current Medications (01/20/2021):  This is the current hospital active medication list Current Facility-Administered Medications  Medication Dose Route Frequency Provider Last Rate Last Admin   (feeding supplement) PROSource Plus liquid 30 mL  30 mL Oral TID BM Danford, Suann Larry, MD       levothyroxine (SYNTHROID) tablet 50 mcg  50 mcg Oral Daily Irene Pap N, DO   50 mcg at 01/20/21 9390   MEDLINE mouth rinse  15 mL Mouth Rinse BID Reubin Milan, MD   15 mL at 01/20/21 0844   midodrine (PROAMATINE) tablet 5 mg  5 mg Oral BID WC Irene Pap N, DO   5 mg at 01/20/21 0818   ondansetron (ZOFRAN) tablet 4 mg  4 mg Oral Q6H PRN Reubin Milan, MD       Or   ondansetron [facility]) injection 4 mg  4 mg Intravenous Q6H PRN Reubin Milan, MD   4 mg at 01/16/21 1215   pantoprazole (PROTONIX) EC tablet 40 mg  40 mg Oral BID Irene Pap N, DO   40 mg at 01/20/21 0818   polyethylene glycol (MIRALAX / GLYCOLAX) packet 17 g  17 g Oral QHS Noralyn Pick, NP   17 g at 01/19/21 2117   predniSONE (DELTASONE) tablet 20 mg  20 mg Oral Q breakfast Sharyn Creamer, MD   20 mg at 01/20/21 0818   tamsulosin (FLOMAX) capsule 0.4 mg  0.4 mg Oral QHS Irene Pap N, DO   0.4 mg at 01/19/21 2116     Discharge Medications: Please see discharge summary for a list of discharge medications.  Relevant Imaging Results:  Relevant Lab Results:   Additional Information SS# [id number]  Purcell Mouton, RN

## 2021-01-20 NOTE — TOC Progression Note (Signed)
Transition of Care [facility]) - Progression Note    Patient Details  Name: Stacey Velazquez MRN: [id number] Date of Birth: 1945/12/30  [facility] CM/SW Contact  Purcell Mouton, RN Phone Number: 01/20/2021, 1:31 PM  Clinical Narrative:    Spoke with pt who agreed to going to SNF for Rehab. Will fax pt out to SNF.    Expected Discharge Plan: [place] Barriers to Discharge: No Barriers Identified  Expected Discharge Plan and Services Expected Discharge Plan: [place] arrangements for the past 2 months: Single Family Home                                       Social Determinants of Health (SDOH) Interventions    Readmission Risk Interventions No flowsheet data found.

## 2021-01-20 NOTE — Progress Notes (Addendum)
Progress Note Hospital Day: 9  Chief Complaint:    GI bleed, cirrhosis    Attending physician's note   I have taken an interval history, reviewed the chart and examined the patient. I agree with the Advanced Practitioner's note, impression and recommendations.   75 year old very pleasant female with history of autoimmune hepatitis cirrhosis decompensated with ascites Meld sodium score 27  Hemoglobin trended down to 7.9 this morning, no sign of overt GI bleeding WBC count has also down trended, has history of pancytopenia INR is high at 3.4 No sign of asterixis or hepatic encephalopathy on exam  Continue to monitor CBC, CMP and PT/INR. Will hold off endoscopic evaluation at this point unless she develops overt signs of bleeding  Discussed briefly goals of care with her daughter, she is open to meeting with palliative care team to discuss it further    I have spent 35 minutes of patient care (this includes precharting, chart review, review of results, face-to-face time used for counseling as well as treatment plan and follow-up. The patient was provided an opportunity to ask questions and all were answered. The patient agreed with the plan and demonstrated an understanding of the instructions.  Damaris Hippo , MD [phone]     ASSESSMENT AND PLAN   Brief History: 75 yo female with PMH of HTN, SSS s/p pacemaker,  CAD / MI, CVA, CKD, PUD, colon polyps, pancreatic tail cystic lesion, AIH cirrhosis, hypothyroidism . Admitted 12/19 with acute on chronic anemia, rectal bleeding / melena / abdominal pain and abdominal distention  # Decompensated AIH cirrhosis with ascites, GI bleed. MELD Na 27. No obvious source for bleed on EGD or flex sigmoidoscopy which showed grade II esophageal varices without stigmata of recent bleeding,  PHG, gastritis, diverticulosis and hemorrhoids.  --No further bleeding. Soft, brown stool x 1 this am. Hgb fluctuating, 7.9 today. Continue BID PPI.  Monitor for any recurrent bleeding, especially since source wasn't located --No lasting improvement in coagulopathy despite PO Vitamin K. INR back up to 3.4 today but no active bleeding  --LVP on 12/19 and 12/22. No SBP, received 7 days of Cipro.  --Ascitic fluid cytology still pending --Seems to be having some memory problems. Says she had several BMs this am. Spoke to [employer] / Psychologist, counselling. Only one BM today. No asterixis on exam --Outpatient diuretic still on hold. Creatinine 1.43, down from 1.75 overnight --Regarding AIH, Prograf recently stopped due to elevated creatinine.Currently being treated with prednisone.  Previously evaluated by [facility] and not transplant candidate.   # Acute on chronic anemia related to GI bleed. Baseline hgb 9-10, down to 6.7 this admission --hgb has fluctuated since 1 u PRBC on 12/21 but over all improved appropriately by one gram to 7.9 today.   # AKI on CKD stage IIIa  --Creatinine improved overnight from 1.75 to 1.43.   # Pancytopenia. Hematology has evaluated.  --Getting Granix injections --Received plalets on 12/20 DIAGNOSTIC STUDIES THIS ADMISSION:   EGD Grade II esophageal varices. - Portal hypertensive gastropathy. - Gastritis. Biopsied. - Normal examined duodenum.  Flexible sigmoidoscopy -Multiple, non-bleeding polyps in the sigmoid colon, in the descending colon and in the transverse colon. Not removed due to presentation for bleeding. - Non-bleeding internal hemorrhoids. --Stool in the rectum, in the sigmoid colon, in the descending colon and in the transverse colon. - Diverticulosis in the sigmoid colon, in the descending colon and in the transverse colon.  SUBJECTIVE   In bedside chair. No  complaints. Says her abdomen feels much better. Tells me she has had several BMs today and doesn't think they contained blood but not completely sure   OBJECTIVE      Scheduled inpatient medications:   (feeding supplement)  PROSource Plus  30 mL Oral BID BM   feeding supplement  237 mL Oral Q24H   levothyroxine  50 mcg Oral Daily   mouth rinse  15 mL Mouth Rinse BID   midodrine  5 mg Oral BID WC   pantoprazole  40 mg Oral BID   polyethylene glycol  17 g Oral QHS   predniSONE  20 mg Oral Q breakfast   tamsulosin  0.4 mg Oral QHS   Continuous inpatient infusions:  PRN inpatient medications: ondansetron **OR** ondansetron (ZOFRAN) IV  Vital signs in last 24 hours: Temp:  [97.5 F (36.4 C)-98 F (36.7 C)] 97.6 F (36.4 C) (12/27 0415) Pulse Rate:  [65-89] 65 (12/27 0415) Resp:  [18-22] 22 (12/27 0415) BP: (114-122)/(71-81) 114/71 (12/27 0415) SpO2:  [99 %-100 %] 100 % (12/27 0415) Last BM Date: 01/20/21  Intake/Output Summary (Last 24 hours) at 01/20/2021 1014 Last data filed at 01/20/2021 0426 Gross per 24 hour  Intake 846.57 ml  Output --  Net 846.57 ml     Physical Exam:  General: Alert female in NAD Heart:  Regular rate and rhythm. No lower extremity edema Pulmonary: Normal respiratory effort Abdomen: Soft, moderately distended, nontender. Normal bowel sounds.  Neurologic: Alert and oriented. No asterixis.  Psych: Pleasant. Cooperative.   Filed Weights   01/12/21 1147 01/14/21 0655  Weight: 80.3 kg 85.9 kg    Intake/Output from previous day: 12/26 0701 - 12/27 0700 In: 846.6 [P.O.:480; IV Piggyback:366.6] Out: -  Intake/Output this shift: No intake/output data recorded.    Lab Results: Recent Labs    01/18/21 0423 01/19/21 0402 01/20/21 0403  WBC 3.0* 3.6* 1.8*  HGB 9.2* 9.9* 7.9*  HCT 26.9* 29.2* 23.2*  PLT 48* 46* 41*   BMET Recent Labs    01/18/21 0423 01/19/21 0402 01/20/21 0403  NA 134* 133* 137  K 4.0 3.8 3.8  CL 107 106 106  CO2 23 21* 24  GLUCOSE 103* 104* 103*  BUN 26* 30* 37*  CREATININE 1.55* 1.75* 1.43*  CALCIUM 8.3* 8.3* 8.5*   LFT Recent Labs    01/19/21 0402  PROT 5.3*  ALBUMIN 3.0*  AST 49*  ALT 31  ALKPHOS 76  BILITOT 1.2    PT/INR Recent Labs    01/19/21 0402 01/20/21 0403  LABPROT 30.5* 34.7*  INR 2.9* 3.4*   Hepatitis Panel No results for input(s): HEPBSAG, HCVAB, HEPAIGM, HEPBIGM in the last 72 hours.  No results found.      Principal Problem:   Ascites of liver Active Problems:   Pancytopenia ([place])   Hypothyroidism   Vertigo   Autoimmune hepatitis ([place])   Decompensated hepatic cirrhosis (H[facility])   Esophageal varices determined by endoscopy ([place])   Portal hypertensive gastropathy (H[facility])   Hyponatremia   Chronic blood loss anemia   Anemia associated with chemotherapy   Neutropenia, drug-induced (HCC)   AKI (acute kidney injury) ([place])   Acute metabolic encephalopathy   Acute blood loss anemia     LOS: 7 days   Tye Savoy ,NP 01/20/2021, 10:14 AM

## 2021-01-21 DIAGNOSIS — D500 Iron deficiency anemia secondary to blood loss (chronic): Secondary | ICD-10-CM | POA: Diagnosis not present

## 2021-01-21 DIAGNOSIS — D6481 Anemia due to antineoplastic chemotherapy: Secondary | ICD-10-CM | POA: Diagnosis not present

## 2021-01-21 DIAGNOSIS — G9341 Metabolic encephalopathy: Secondary | ICD-10-CM

## 2021-01-21 DIAGNOSIS — K754 Autoimmune hepatitis: Secondary | ICD-10-CM | POA: Diagnosis not present

## 2021-01-21 DIAGNOSIS — D689 Coagulation defect, unspecified: Secondary | ICD-10-CM

## 2021-01-21 LAB — PROTIME-INR
INR: 2.8 — ABNORMAL HIGH (ref 0.8–1.2)
Prothrombin Time: 29.4 seconds — ABNORMAL HIGH (ref 11.4–15.2)

## 2021-01-21 LAB — COMPREHENSIVE METABOLIC PANEL
ALT: 42 U/L (ref 0–44)
AST: 61 U/L — ABNORMAL HIGH (ref 15–41)
Albumin: 3.4 g/dL — ABNORMAL LOW (ref 3.5–5.0)
Alkaline Phosphatase: 65 U/L (ref 38–126)
Anion gap: 5 (ref 5–15)
BUN: 42 mg/dL — ABNORMAL HIGH (ref 8–23)
CO2: 22 mmol/L (ref 22–32)
Calcium: 8.5 mg/dL — ABNORMAL LOW (ref 8.9–10.3)
Chloride: 106 mmol/L (ref 98–111)
Creatinine, Ser: 1.22 mg/dL — ABNORMAL HIGH (ref 0.44–1.00)
GFR, Estimated: 46 mL/min — ABNORMAL LOW (ref >60)
Glucose, Bld: 97 mg/dL (ref 70–99)
Potassium: 3.5 mmol/L (ref 3.5–5.1)
Sodium: 133 mmol/L — ABNORMAL LOW (ref 135–145)
Total Bilirubin: 1.5 mg/dL — ABNORMAL HIGH (ref 0.3–1.2)
Total Protein: 5.4 g/dL — ABNORMAL LOW (ref 6.5–8.1)

## 2021-01-21 LAB — CBC
HCT: 25.8 % — ABNORMAL LOW (ref 36.0–46.0)
Hemoglobin: 9.1 g/dL — ABNORMAL LOW (ref 12.0–15.0)
MCH: 31.1 pg (ref 26.0–34.0)
MCHC: 35.3 g/dL (ref 30.0–36.0)
MCV: 88.1 fL (ref 80.0–100.0)
Platelets: 44 10*3/uL — ABNORMAL LOW (ref 150–400)
RBC: 2.93 MIL/uL — ABNORMAL LOW (ref 3.87–5.11)
RDW: 15.4 % (ref 11.5–15.5)
WBC: 1.7 10*3/uL — ABNORMAL LOW (ref 4.0–10.5)
nRBC: 0 % (ref 0.0–0.2)

## 2021-01-21 NOTE — Progress Notes (Addendum)
Progress Note Hospital Day: 10  Chief Complaint:   GI bleed, cirrhosis      Attending physician's note   I have taken an interval history, reviewed the chart and examined the patient. I agree with the Advanced Practitioner's note, impression and recommendations.   Hemoglobin fluctuating, 9.1 today.  No sign of bleeding. Follow-up with [facility] on discharge  GI will sign off, is available if needed   The patient was provided an opportunity to ask questions and all were answered. The patient agreed with the plan and demonstrated an understanding of the instructions.  Damaris Hippo , MD [phone]    ASSESSMENT AND PLAN   Brief History: 75 yo female with PMH of HTN, SSS s/p pacemaker,  CAD / MI, CVA, CKD, PUD, colon polyps, pancreatic tail cystic lesion, AIH cirrhosis, hypothyroidism.  Admitted 12/19 with acute on chronic anemia, rectal bleeding / melena / abdominal pain and abdominal distention   # Decompensated AIH cirrhosis with ascites, GI bleed. MELD Na 27. No obvious source for bleed on EGD or flex sigmoidoscopy which showed grade II esophageal varices without stigmata of recent bleeding,  PHG, gastritis, diverticulosis and hemorrhoids.  --No further bleeding. Hgb up to 9.1 today   -INR 2.8  --LVP on 12/19 and 12/22. No SBP, received 7 days of Cipro.  --Ascitic fluid cytology negative for malignancy --Outpatient diuretics still on hold. Creatinine improving. Hopefully resume diuretic soon. Lower extremity edema on exam. Continue 2 grams sodium diet --Regarding AIH, Prograf recently stopped due to elevated creatinine.Currently being treated with prednisone.  Previously evaluated by [facility] and not transplant candidate. Would continue Prednisone   # Acute on chronic anemia related to GI bleed. Baseline hgb 9-10, down to 6.7 this admission --hgb stable at 9.1 post 2 uPRBC several days ago   # AKI on CKD stage IIIa  --Creatinine improving 1.75 >>  1.43 >> 1.22    # Pancytopenia. Hematology has evaluated.  --WBC 1.7, platelets 44 --Getting Granix injections --Received plalets on 12/20  DIAGNOSTIC STUDIES THIS ADMISSION:    EGD Grade II esophageal varices. - Portal hypertensive gastropathy. - Gastritis. Biopsied. - Normal examined duodenum.   Flexible sigmoidoscopy -Multiple, non-bleeding polyps in the sigmoid colon, in the descending colon and in the transverse colon. Not removed due to presentation for bleeding. - Non-bleeding internal hemorrhoids. --Stool in the rectum, in the sigmoid colon, in the descending colon and in the transverse colon. - Diverticulosis in the sigmoid colon, in the descending colon and in the transverse colon.    SUBJECTIVE   Feels okay. Had transient epigastric discomfort radiating around left abdomen / left side but has resolved. Says she consumed all of her lunch. She is having BMs ( one documented so far today).        OBJECTIVE      Scheduled inpatient medications:   (feeding supplement) PROSource Plus  30 mL Oral TID BM   levothyroxine  50 mcg Oral Daily   mouth rinse  15 mL Mouth Rinse BID   midodrine  5 mg Oral BID WC   pantoprazole  40 mg Oral BID   polyethylene glycol  17 g Oral QHS   predniSONE  20 mg Oral Q breakfast   tamsulosin  0.4 mg Oral QHS   Continuous inpatient infusions:  PRN inpatient medications: ondansetron **OR** ondansetron (ZOFRAN) IV  Vital signs in last 24 hours: Temp:  [97.9 F (36.6 C)-98.1 F (36.7 C)] 97.9 F (36.6  C) (12/28 0442) Pulse Rate:  [63-69] 69 (12/28 0442) Resp:  [16-17] 16 (12/28 0442) BP: (116-129)/(70-78) 129/78 (12/28 0442) SpO2:  [97 %-100 %] 97 % (12/28 0442) Last BM Date: 01/20/21  Intake/Output Summary (Last 24 hours) at 01/21/2021 0845 Last data filed at 01/21/2021 0500 Gross per 24 hour  Intake 240 ml  Output 950 ml  Net -710 ml     Physical Exam:  General: Alert female in NAD Heart:  Regular rate and rhythm.  Pitting edema of BLE extremities Pulmonary: Normal respiratory effort Abdomen: Soft, nondistended, nontender. Normal bowel sounds.  Neurologic: Alert and oriented Psych: Pleasant. Cooperative.   Filed Weights   01/12/21 1147 01/14/21 0655  Weight: 80.3 kg 85.9 kg    Intake/Output from previous day: 12/27 0701 - 12/28 0700 In: 240 [P.O.:240] Out: 950 [Urine:950] Intake/Output this shift: No intake/output data recorded.    Lab Results: Recent Labs    01/19/21 0402 01/20/21 0403 01/21/21 0402  WBC 3.6* 1.8* 1.7*  HGB 9.9* 7.9* 9.1*  HCT 29.2* 23.2* 25.8*  PLT 46* 41* 44*   BMET Recent Labs    01/19/21 0402 01/20/21 0403 01/21/21 0402  NA 133* 137 133*  K 3.8 3.8 3.5  CL 106 106 106  CO2 21* 24 22  GLUCOSE 104* 103* 97  BUN 30* 37* 42*  CREATININE 1.75* 1.43* 1.22*  CALCIUM 8.3* 8.5* 8.5*   LFT Recent Labs    01/21/21 0402  PROT 5.4*  ALBUMIN 3.4*  AST 61*  ALT 42  ALKPHOS 65  BILITOT 1.5*   PT/INR Recent Labs    01/20/21 0403 01/21/21 0402  LABPROT 34.7* 29.4*  INR 3.4* 2.8*   Hepatitis Panel No results for input(s): HEPBSAG, HCVAB, HEPAIGM, HEPBIGM in the last 72 hours.  No results found.      Principal Problem:   Ascites of liver Active Problems:   Pancytopenia ([place])   Hypothyroidism   Vertigo   Autoimmune hepatitis ([place])   Decompensated hepatic cirrhosis (H[facility])   Esophageal varices determined by endoscopy ([place])   Portal hypertensive gastropathy (H[facility])   Hyponatremia   Chronic blood loss anemia   Anemia associated with chemotherapy   Neutropenia, drug-induced (H[facility])   AKI (acute kidney injury) ([place])   Acute metabolic encephalopathy   Acute on chronic blood loss anemia   Coagulopathy due to cirrhosis     LOS: 8 days   Tye Savoy ,NP 01/21/2021, 8:45 AM

## 2021-01-21 NOTE — Consult Note (Signed)
Consultation Note Date: 01/21/2021   Patient Name: Stacey Velazquez  DOB: [birth year]  MRN: [id number]  Age / Sex: 75 y.o., female  PCP: Libby Maw, MD Referring Physician: Kayleen Memos, DO  Reason for Consultation: Establishing goals of care  HPI/Patient Profile: 75 y.o. female  with past medical history of cirrhosis due to autoimmune hepatitis, sick sinus syndrome status post pacemaker, CAD status post PCI, CVA CKD admitted on 01/12/2021 with rectal bleeding, melena, abdominal distention bilateral lower extremity swelling.  She had a EGD this admission showed varices and gastritis but no signs or recent bleeding.  Flex sig showed stool with no source of bleeding.  She has undergone paracentesis x2.  Creatinine elevated and progressed started on steroids secondary to this.  Hemoglobin has trended down and she is status post blood transfusion.  Palliative consulted for goals of care.  Clinical Assessment and Goals of Care: Palliative consult received.  Chart reviewed including personal review of pertinent labs and imaging.  I saw and examined Stacey Velazquez today and met with her and her daughter, [name].  She reports that her faith is most important thing to her.  She begins each day praying for strength and for God's wisdom about what she has to do that day.  Her family is also very important to her including her 2 daughters, 4 granddaughters, and grandson.  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  She has been widowed since 2008.  She enjoys the outdoors, gardening, and reading books.  She currently lives in an independent senior living facility.  We discussed clinical course as well as wishes moving forward in regard to advanced directives.  Values and goals of care important to patient and family were attempted to be elicited.  We discussed  that she has incurable illness and this is something that is going to continue to progress over time regardless of interventions.  She states that she is really relying on God for strength and taking things 1 day at a time.  Discussed that in light of her incurable illness, our goal is to pursue interventions that we will add as much time and quality to her life as possible.  She states that she is not afraid of dying and that this is something that family has discussed, but at the same time she minimizes conversations about the severity of her illness.   She is agreeable to further follow-up tomorrow to continue discussion.   Questions and concerns addressed.   PMT will continue to support holistically.  SUMMARY OF RECOMMENDATIONS   -Full code/full scope -She reports having paperwork naming her daughter, [name], as [facility] POA.  She also states having living will.  Her daughter will work to obtain a copy of this for [place] to review together and uploaded to her chart. -Plan for follow-up meeting tomorrow at [place]: Full code  Prognosis:  Guarded  Discharge Planning: To Be Determined  Primary Diagnoses: Present on Admission:  Ascites of liver  Autoimmune hepatitis ([place])  Esophageal varices determined by endoscopy ([place])  Hypothyroidism  Chronic blood loss anemia  Pancytopenia (HCC)  Hyponatremia  (Resolved) Abdominal ascites  Anemia associated with chemotherapy  Neutropenia, drug-induced (HCC)  Acute metabolic encephalopathy  Acute on chronic blood loss anemia  Portal hypertensive gastropathy (HCC)  Decompensated hepatic cirrhosis (HCC)  Coagulopathy due to cirrhosis   I have reviewed the medical record, interviewed the patient and family, and examined the patient. The following aspects are pertinent.  Past Medical History:  Diagnosis Date   Adenomatous polyp of ascending colon    Anxiety    Bilateral lower extremity edema    burning  sensations   Bipolar 1 disorder (HCC)    Carpal tunnel syndrome    Cirrhosis of liver (HCC)    CKD (chronic kidney disease) stage 4, GFR 15-29 ml/min ([place]) 11/04/2020   Cutaneous horn    Drug-induced parkinsonism ([place])    patient unaware   Esophageal varices ([place])    Fibromyalgia    patient denies   Gastric ulcer    GERD (gastroesophageal reflux disease)    Gingival abscess 08/26/2016   Glaucoma 2003   pt unaware   Heart attack ([place])    Heart disease    Hepatitis, autoimmune ([place]) 08/25/2014   History of iron deficiency anemia    Hypotension    Hypothyroidism    Migraines    Neutropenia (HCC)    Osteoporosis    Pacemaker Medtronic    MRI compatible   Paranoid schizophrenia ([place])    Polyarthritis    Polyosteoarthritis    Rectal mass    Seizures ([place])    last sz 08/21/14   Sinus arrest    Skin cancer    forehead   SSS (sick sinus syndrome) (HCC)    Stroke (HCC)    Thrombocytopenia (HCC)    Vertigo    bvvp   Social History   Socioeconomic History   Marital status: Widowed    Spouse name: Not on file   Number of children: Not on file   Years of education: Not on file   Highest education level: Not on file  Occupational History   Occupation: disable  Tobacco Use   Smoking status: Former    Types: Cigarettes    Quit date: 03/02/2003    Years since quitting: 17.9   Smokeless tobacco: Never  Vaping Use   Vaping Use: Never used  Substance and Sexual Activity   Alcohol use: No   Drug use: No   Sexual activity: Not on file  Other Topics Concern   Not on file  Social History Narrative   Lives alone, "have friends and relatives that come and help me"   caffeine use- coffee -1 cup daily   Social Determinants of Health   Financial Resource Strain: Not on file  Food Insecurity: Not on file  Transportation Needs: Not on file  Physical Activity: Not on file  Stress: Not on file  Social Connections: Not on file   Family History  Problem Relation Age of Onset    Heart attack Father    Heart disease Paternal Uncle    Heart disease Paternal Grandmother    Diabetes Paternal Grandmother    Heart disease Paternal Grandfather    Breast cancer Mother    Cancer Maternal Grandmother    CVA Maternal Grandmother    Cancer Maternal Grandfather    CVA Maternal Grandfather  Diabetes Sister    Diabetes Brother    Diabetes Brother    Stroke Daughter    Scheduled Meds:  (feeding supplement) PROSource Plus  30 mL Oral TID BM   levothyroxine  50 mcg Oral Daily   mouth rinse  15 mL Mouth Rinse BID   midodrine  5 mg Oral BID WC   pantoprazole  40 mg Oral BID   polyethylene glycol  17 g Oral QHS   predniSONE  20 mg Oral Q breakfast   tamsulosin  0.4 mg Oral QHS   Continuous Infusions: PRN Meds:.ondansetron **OR** ondansetron (ZOFRAN) IV Medications Prior to Admission:  Prior to Admission medications   Medication Sig Start Date End Date Taking? Authorizing Provider  dexlansoprazole (DEXILANT) 60 MG capsule Take 1 capsule (60 mg total) by mouth daily. 05/08/20  Yes Gatha Mayer, MD  famotidine (PEPCID) 20 MG tablet Take 1 tablet (20 mg total) by mouth 2 (two) times daily. 12/12/20  Yes Thornton Park, MD  levothyroxine (SYNTHROID) 50 MCG tablet Take 1 tablet (50 mcg total) by mouth daily. 12/17/20  Yes Libby Maw, MD  polyethylene glycol (MIRALAX / GLYCOLAX) 17 g packet Take 8.5 g by mouth daily as needed for severe constipation.   Yes [provider]  Probiotic Product (DIGESTIVE ADV DIGESTIVE/IMMUNE) CHEW Chew 2 capsules by mouth at bedtime.   Yes [provider]  Skin Protectants, Misc. (DRY SKIN EX) Apply 1 application topically daily. Apply to both elbows, knee caps, and feet   Yes [provider]  tamsulosin (FLOMAX) 0.4 MG CAPS capsule Take 0.4 mg by mouth at bedtime. 07/16/19  Yes [provider]  furosemide (LASIX) 20 MG tablet Take 20 mg by mouth daily. Patient not taking: Reported on 01/12/2021     [provider]  linaclotide Rolan Lipa) 145 MCG CAPS capsule Take 1 capsule (145 mcg total) by mouth daily before breakfast. Patient not taking: Reported on 01/12/2021 09/25/20   Thornton Park, MD  spironolactone (ALDACTONE) 25 MG tablet TAKE 1/2 TABLET (12.5 MG TOTAL) BY MOUTH DAILY. Patient not taking: Reported on 01/12/2021 06/18/20   Haydee Salter, MD  tacrolimus (PROGRAF) 1 MG capsule Take 2 capsules (2 mg total) by mouth daily. ON HOLD D/T RENAL FUNCTION Patient not taking: Reported on 01/12/2021 11/06/20   Thornton Park, MD   Allergies  Allergen Reactions   Misc. Throat Products Other (See Comments)    PT states that she is allergic to OTC  Flu, cold, sinus, allergy, gas, and GERD meds   Neomycin Nausea And Vomiting   Oxycodone-Acetaminophen Rash   Ambien [Zolpidem Tartrate] Other (See Comments)    Hallucinations    Amitriptyline Other (See Comments)    Stops heart per pt   Ampicillin Other (See Comments)    Stops heart per pt    Anaprox [Naproxen Sodium] Other (See Comments)    Doesn't work per pt   Benadryl [Diphenhydramine Hcl (Sleep)] Other (See Comments)    Rash   Cetirizine & Related Other (See Comments)    Swelling    Cortizone-10 [Hydrocortisone] Other (See Comments)    seizures    Darvon [Propoxyphene] Other (See Comments)    Increased heart rate per pt    Diazepam Other (See Comments)    Stops heart per pt   Diflunisal Swelling and Other (See Comments)    Stops heart per pt (Dolobid)   Duloxetine Other (See Comments)    Reaction to Cymbalta - pt doesn't remember what the reaction  was   Flexeril [Cyclobenzaprine] Other (See Comments)    Seizures    Lactose Intolerance (Gi) Swelling and Other (See Comments)    cramping   Lidocaine Other (See Comments)    Seizures   Meperidine And Related Other (See Comments)    Stops heart rate (reaction to Demerol)   Metanx [L-Methylfolate-Algae-B12-B6] Other (See Comments)    Pt does not remember  reaction   Metoclopramide Other (See Comments)    Pt does not remember reaction   Morphine And Related Other (See Comments)    Stops heart per pt    Nuprin [Ibuprofen] Other (See Comments)    Caused headache   Oxycodone Other (See Comments)    Hallucinations    Penicillins Other (See Comments)    Stops heart per pt    Propoxyphene Other (See Comments)    Slowed heart rate per pt (reaction to Darvocet)   Ranitidine Other (See Comments)    Pt does not remember reaction   Vicodin [Hydrocodone-Acetaminophen] Other (See Comments)    Seizures    Butenafine Hcl Rash   Tramadol Other (See Comments)   Tylenol [Acetaminophen] Rash   Zolpidem Other (See Comments) and Rash    Hallucinations    Review of Systems  Gastrointestinal:  Positive for abdominal distention, abdominal pain and blood in stool.    Physical Exam General: Alert, awake, in no acute distress.   HEENT: No bruits, no goiter, no JVD Heart: Regular rate and rhythm. No murmur appreciated. Lungs: Good air movement, clear Abdomen: Soft, nontender, mild distended, positive bowel sounds.   Ext: No significant edema Skin: Warm and dry Neuro: Grossly intact, nonfocal.   Vital Signs: BP 129/78 (BP Location: Left Arm)    Pulse 69    Temp 97.9 F (36.6 C) (Oral)    Resp 16    Ht 5' 9"  (1.753 m)    Wt 85.9 kg    SpO2 97%    BMI 27.97 kg/m  Pain Scale: 0-10   Pain Score: 3    SpO2: SpO2: 97 % O2 Device:SpO2: 97 % O2 Flow Rate: .   IO: Intake/output summary:  Intake/Output Summary (Last 24 hours) at 01/21/2021 0910 Last data filed at 01/21/2021 0500 Gross per 24 hour  Intake 240 ml  Output 950 ml  Net -710 ml    LBM: Last BM Date: 01/20/21 Baseline Weight: Weight: 80.3 kg Most recent weight: Weight: 85.9 kg     Palliative Assessment/Data:   Flowsheet Rows    Flowsheet Row Most Recent Value  Intake Tab   Referral Department Hospitalist  Unit at Time of Referral Med/Surg Unit  Palliative Care Primary  Diagnosis Other (Comment)  [GI]  Date Notified 01/19/21  Palliative Care Type New Palliative care  Reason for referral Clarify Goals of Care  Date of Admission 01/12/21  Date first seen by Palliative Care 01/20/21  # of days Palliative referral response time 1 Day(s)  # of days IP prior to Palliative referral 7  Clinical Assessment   Palliative Performance Scale Score 40%  Psychosocial & Spiritual Assessment   Palliative Care Outcomes   Patient/Family meeting held? Yes  Who was at the meeting? Patient, daughter  Palliative Care Outcomes Clarified goals of care       Time In: 1715 Time Out: 1840 Time Total: 85 Greater than 50%  of this time was spent counseling and coordinating care related to the above assessment and plan.  Signed by: Micheline Rough, MD   Please contact Palliative  Medicine Team phone at [phone] for questions and concerns.  For individual provider: See Shea Evans

## 2021-01-21 NOTE — Progress Notes (Signed)
Progress Note   Patient: Stacey Velazquez:[id number] DOB: Aug 19, 1945 DOA: 01/12/2021     8 DOS: the patient was seen and examined on 01/21/2021       Brief hospital course: Stacey Velazquez is a 75 y.o. F with cirrhosis due to AIH, SSS s/p PPM, CAD s/p remote PCI, CVA without residuals, CKD who presented with rectal bleeding, melena, and abdominal distension/BLE swelling.   In the ER, she was found to have a Hb drop from 10 > 7, elevated INR and ascites.     12/19: Admitted, GI consulted for GI bleeding, underwent paracentesis 3.5L fluid 12/20: EGD colon showed varices, gastritis but no sitgmata of recent bleeding, flex sig with stool, but no source identified, no melena in colon 12/21: Hgb down to 6.7 g/dL, transfused 1 unit; Liver doppler negative for thrombosis, mentation worsening --> albumin started 12/22: Repeat paracentesis again 3L, no SBP again, mentation and renal function improved with albumin, vit K 12/23: Steroids started 12/24-27: Improving on steroids but Hgb trending down    01/21/2021: Seen at her bedside.  States she feels better today.  She denies any abdominal pain, constipation, or difficulties breathing.  CODE STATUS changed on 01/21/2021 to DNR.     Assessment and Plan * Autoimmune hepatitis ([place])- (present on admission) Esophageal varices determined by endoscopy ([place])- (present on admission) Ascites of liver- (present on admission) Decompensated hepatic cirrhosis Coagulopathy due to Cirrhosis  Initially admitted for bleeding, but now decompensated cirrhosis prevents discharge.  MELD 27 Completed 3d vitamin K, 7d Cipro for SBP PPx  Discussed with Palliative Care today  - Continue prednisone 20, plan for no taper at d/c - Continue PPI, midodrine  - Hold diuretics until Cr trend clearer  - Consult GI, appreciate cares - Consult Palliative Care        Acute on chronic blood loss anemia  Anemia associated with chemotherapy  Chronic blood  loss anemia  Iron replete  Trending down, in the 7s today.  No melena.  -Continue ppi - Check iron studies in 2 months  Pancytopenia ([place])- (present on admission) Multifactorial.  ANC down to <500 at one point.  Heme consulted, Granix given, ANC recovered. Neutropenia largely sequestration, drug induced, BM failure.  Anemia largely chronic blood loss.   Thrombocytopenia largely sequestration.  Transfused 1 unit platelets on 12/20 and 1 unit PRBCs on 12/21, none since    AKI (acute kidney injury) ([place]) It seems her baseline Cr has been in the 0.9 mg/dL range, wtihin the last 6 months, has fluctuated a lot recently.  Was recently up >2 mg/dL in setting of tacrolimus, more recently down to 1.2-1.3 here, and as high as 1.6 mg/dL.  HRS ruled out, nephrology consulted.  Cr better at 1.4 today.  Urine sodium low again. Given her liver disease 1.0-1.5 is probably within the range of her normal.    - Continue midodrine - Nephrology follow up needed at d/c     Hypothyroidism- (present on admission) -Continue levothyroxine    Resolved issues: Hyponatremia- (present on admission) -Continue Fluid restriction  Acute metabolic encephalopathy- (present on admission) Patient noted to have sluggishness, decreased responsiveness earlier in hospital stay.  This resolved with albumin, paracentesis and starting steroids.  Now at baseline.  Vertigo CT head unremarkable.  Has had BPPV in the past. Vertigo now completely resolved.    Subjective: No melena.  No fever.  No confusion.  No dysuria, no cough.  1 soft brown bowel movement overnight.  Objective Vital signs  were reviewed and unremarkable. General appearance: Elderly adult female, sitting up in bed, interactive, no acute distress     HEENT: Partially edentulous, lips dry, no oral lesions, hearing normal Skin: No suspicious rashes or lesions, no icterus, no jaundice Cardiac: RRR, no murmurs, 1+ bilateral nonpitting edema,  bilaterally Respiratory: Normal respiratory rate and rhythm, lungs clear without rales or wheezes Abdomen: Abdomen soft, mild distention and ascites, not tense, no tenderness. MSK:  Neuro: Awake and alert, extraocular movements intact, face symmetric, speech fluent, keeps the right eye closed because of her diplopia.  Stacey Velazquez. Psych: Attention normal, affect normal, judgment insight appear normal   Data Reviewed: Review of the basic metabolic panel and complete blood count is reviewed above, INR trending back up.  Family Communication: Daughter Stacey Velazquez at the bedside  Disposition: Status is: Inpatient  Remains inpatient appropriate because:   Her hemoglobin and INR are trending in the wrong direction.    I suspect with her portal gastropathy combined with coagulopathy, she will not be able to produce blood fast enough to replace what is lost.    TIPS to reduce portal gastropathy would risk HE in this elderly patient with some mild cognitive impairment already.  She is not a transplant candidate, and if her blood loss, coagulaopathy, and renal disease do not improve with steroids and supportive cares, it may be that she needs to return home with hospice soon.  At present, her best hope is that things stabilize, she can rehab and then discharge to home with some improvement of function, to spend some time with family.      Author: Kayleen Memos 01/21/2021 2:19 PM  For on call review [email or web address].

## 2021-01-21 NOTE — Plan of Care (Signed)

## 2021-01-21 NOTE — Progress Notes (Signed)
Palliative care progress note  Reason for consult: Goals of care  I saw and examined Ms. Stoffer and met with her and her daughter.  We reviewed her clinical course and continued concerns about irreversible nature of her illness.  We reviewed a MOST form and discussed how to develop plan of care to focus on continuing therapies that would maximize chance of being well enough to return home and limiting therapies not in line with this goal.  We discussed regarding heroic interventions at the end-of-life. There is agreement this would not be in line with prior expressed wishes for a natural death or be likely to lead to getting well enough to go back home. Family in agreement with changing CODE STATUS to DO NOT RESUSCITATE.   We completed MOST form today but did not sign it today as they want to review with patient's other daughter prior to her signing it.  DNR, Limited additional interventions, IVF and ABX if indicated, time-limited trial of feeding tube.  Were planning to meet again tomorrow at 10:30 AM to see if there are any questions and consider completing it with signatures at that time  We discussed that the hospital can be useful as long as she is getting well enough from care she receives at the hospital to enjoy time at home, but there is going to come a time in the future where she may be better served to plan on bringing care to her rather repeated trips to the hospital. We discussed hospice as a tool that may be beneficial in this goal when she reaches a point where we are trying to fix problems that are not fixable.  She is in agreement that a good plan would be to plan to work to get to rehab as they have previously been arranging. She has done well with rehabbing in the past. If she does well at rehab and continues to thrive, I encouraged they continue with this plan. If, however she is unable to regain function and continues to decline, I recommended that she be followed by  palliative care as an outpatient to determine if he may be better served by focusing his care on staying at home with support of organization such as hospice.  Total time: 55 minutes  Greater than 50%  of this time was spent counseling and coordinating care related to the above assessment and plan.  Micheline Rough, MD [facility] Team [phone]

## 2021-01-22 LAB — CBC WITH DIFFERENTIAL/PLATELET
Abs Immature Granulocytes: 0.07 10*3/uL (ref 0.00–0.07)
Basophils Absolute: 0 10*3/uL (ref 0.0–0.1)
Basophils Relative: 1 %
Eosinophils Absolute: 0 10*3/uL (ref 0.0–0.5)
Eosinophils Relative: 1 %
HCT: 28.8 % — ABNORMAL LOW (ref 36.0–46.0)
Hemoglobin: 10 g/dL — ABNORMAL LOW (ref 12.0–15.0)
Immature Granulocytes: 3 %
Lymphocytes Relative: 19 %
Lymphs Abs: 0.4 10*3/uL — ABNORMAL LOW (ref 0.7–4.0)
MCH: 31 pg (ref 26.0–34.0)
MCHC: 34.7 g/dL (ref 30.0–36.0)
MCV: 89.2 fL (ref 80.0–100.0)
Monocytes Absolute: 0.4 10*3/uL (ref 0.1–1.0)
Monocytes Relative: 16 %
Neutro Abs: 1.3 10*3/uL — ABNORMAL LOW (ref 1.7–7.7)
Neutrophils Relative %: 60 %
Platelets: 44 10*3/uL — ABNORMAL LOW (ref 150–400)
RBC: 3.23 MIL/uL — ABNORMAL LOW (ref 3.87–5.11)
RDW: 15.9 % — ABNORMAL HIGH (ref 11.5–15.5)
WBC: 2.2 10*3/uL — ABNORMAL LOW (ref 4.0–10.5)
nRBC: 0 % (ref 0.0–0.2)

## 2021-01-22 LAB — COMPREHENSIVE METABOLIC PANEL
ALT: 55 U/L — ABNORMAL HIGH (ref 0–44)
AST: 74 U/L — ABNORMAL HIGH (ref 15–41)
Albumin: 3.3 g/dL — ABNORMAL LOW (ref 3.5–5.0)
Alkaline Phosphatase: 70 U/L (ref 38–126)
Anion gap: 4 — ABNORMAL LOW (ref 5–15)
BUN: 39 mg/dL — ABNORMAL HIGH (ref 8–23)
CO2: 23 mmol/L (ref 22–32)
Calcium: 8.1 mg/dL — ABNORMAL LOW (ref 8.9–10.3)
Chloride: 106 mmol/L (ref 98–111)
Creatinine, Ser: 1.24 mg/dL — ABNORMAL HIGH (ref 0.44–1.00)
GFR, Estimated: 45 mL/min — ABNORMAL LOW (ref >60)
Glucose, Bld: 98 mg/dL (ref 70–99)
Potassium: 3 mmol/L — ABNORMAL LOW (ref 3.5–5.1)
Sodium: 133 mmol/L — ABNORMAL LOW (ref 135–145)
Total Bilirubin: 1.5 mg/dL — ABNORMAL HIGH (ref 0.3–1.2)
Total Protein: 5.6 g/dL — ABNORMAL LOW (ref 6.5–8.1)

## 2021-01-22 LAB — PROTIME-INR
INR: 2.2 — ABNORMAL HIGH (ref 0.8–1.2)
Prothrombin Time: 24.3 seconds — ABNORMAL HIGH (ref 11.4–15.2)

## 2021-01-22 LAB — MAGNESIUM: Magnesium: 2 mg/dL (ref 1.7–2.4)

## 2021-01-22 LAB — PHOSPHORUS: Phosphorus: 3.8 mg/dL (ref 2.5–4.6)

## 2021-01-22 MED ORDER — POTASSIUM CHLORIDE CRYS ER 20 MEQ PO TBCR
40.0000 meq | EXTENDED_RELEASE_TABLET | Freq: Three times a day (TID) | ORAL | Status: DC
  Administered 2021-01-22 – 2021-01-23 (×2): 40 meq via ORAL
  Filled 2021-01-22 (×2): qty 2

## 2021-01-22 NOTE — Plan of Care (Signed)
°  Problem: Education: Goal: Knowledge of General Education information will improve Description: Including pain rating scale, medication(s)/side effects and non-pharmacologic comfort measures 01/22/2021 1402 by Zadie Rhine, RN Outcome: Progressing 01/22/2021 1358 by Zadie Rhine, RN Outcome: Progressing 01/22/2021 1346 by Zadie Rhine, RN Outcome: Progressing   Problem: Health Behavior/Discharge Planning: Goal: Ability to manage health-related needs will improve 01/22/2021 1402 by Zadie Rhine, RN Outcome: Progressing 01/22/2021 1358 by Zadie Rhine, RN Outcome: Progressing 01/22/2021 1346 by Zadie Rhine, RN Outcome: Progressing   Problem: Clinical Measurements: Goal: Ability to maintain clinical measurements within normal limits will improve 01/22/2021 1402 by Zadie Rhine, RN Outcome: Progressing 01/22/2021 1358 by Zadie Rhine, RN Outcome: Progressing 01/22/2021 1346 by Zadie Rhine, RN Outcome: Progressing Goal: Will remain free from infection 01/22/2021 1402 by Zadie Rhine, RN Outcome: Progressing 01/22/2021 1358 by Zadie Rhine, RN Outcome: Progressing 01/22/2021 1346 by Zadie Rhine, RN Outcome: Progressing Goal: Diagnostic test results will improve 01/22/2021 1402 by Zadie Rhine, RN Outcome: Progressing 01/22/2021 1358 by Zadie Rhine, RN Outcome: Progressing 01/22/2021 1346 by Zadie Rhine, RN Outcome: Progressing

## 2021-01-22 NOTE — Plan of Care (Signed)

## 2021-01-22 NOTE — Plan of Care (Signed)
°  Problem: Education: Goal: Knowledge of General Education information will improve Description: Including pain rating scale, medication(s)/side effects and non-pharmacologic comfort measures 01/22/2021 1358 by Zadie Rhine, RN Outcome: Progressing 01/22/2021 1346 by Zadie Rhine, RN Outcome: Progressing   Problem: Health Behavior/Discharge Planning: Goal: Ability to manage health-related needs will improve 01/22/2021 1358 by Zadie Rhine, RN Outcome: Progressing 01/22/2021 1346 by Zadie Rhine, RN Outcome: Progressing   Problem: Clinical Measurements: Goal: Ability to maintain clinical measurements within normal limits will improve 01/22/2021 1358 by Zadie Rhine, RN Outcome: Progressing 01/22/2021 1346 by Zadie Rhine, RN Outcome: Progressing Goal: Will remain free from infection 01/22/2021 1358 by Zadie Rhine, RN Outcome: Progressing 01/22/2021 1346 by Zadie Rhine, RN Outcome: Progressing Goal: Diagnostic test results will improve 01/22/2021 1358 by Zadie Rhine, RN Outcome: Progressing 01/22/2021 1346 by Zadie Rhine, RN Outcome: Progressing

## 2021-01-22 NOTE — Plan of Care (Signed)
  Problem: Education: Goal: Knowledge of General Education information will improve Description: Including pain rating scale, medication(s)/side effects and non-pharmacologic comfort measures Outcome: Progressing   Problem: Health Behavior/Discharge Planning: Goal: Ability to manage health-related needs will improve Outcome: Progressing   Problem: Clinical Measurements: Goal: Ability to maintain clinical measurements within normal limits will improve Outcome: Progressing Goal: Will remain free from infection Outcome: Progressing Goal: Diagnostic test results will improve Outcome: Progressing   Problem: Activity: Goal: Risk for activity intolerance will decrease Outcome: Progressing   Problem: Coping: Goal: Level of anxiety will decrease Outcome: Progressing   Problem: Pain Managment: Goal: General experience of comfort will improve Outcome: Progressing   Problem: Safety: Goal: Ability to remain free from injury will improve Outcome: Progressing   Problem: Skin Integrity: Goal: Risk for impaired skin integrity will decrease Outcome: Progressing   

## 2021-01-22 NOTE — TOC Progression Note (Signed)
Transition of Care [facility]) - Progression Note    Patient Details  Name: Stacey Velazquez MRN: [id number] Date of Birth: 1945/06/23  Transition of Care [facility]) CM/SW Contact  Purcell Mouton, RN Phone Number: 01/22/2021, 2:38 PM  Clinical Narrative:    Pt have a bed at [facility] in the AM.   Expected Discharge Plan: [place] Barriers to Discharge: No Barriers Identified  Expected Discharge Plan and Services Expected Discharge Plan: [place] arrangements for the past 2 months: Single Family Home                                       Social Determinants of Health (SDOH) Interventions    Readmission Risk Interventions No flowsheet data found.

## 2021-01-22 NOTE — Progress Notes (Signed)
Palliative care progress note  Reason for consult: Goals of care  I met today with Ms. Searight and her daughter.  We reviewed clinical course and her labwork from this morning.  Discussed that her labs appear to be stable from yesterday and we are likely approaching point of discharge.  We completed MOST form today.  DNR, Limited additional interventions, IVF and ABX if indicated, time-limited trial of feeding tube.  Copy given to her daughter and sent for upload into her chart.  Plan to transition to skilled facility for trial of rehab.    Recommend palliative care to follow as an outpatient.  Total time: 40 minutes  Greater than 50%  of this time was spent counseling and coordinating care related to the above assessment and plan.  Micheline Rough, MD [place] Team [phone]

## 2021-01-22 NOTE — TOC Progression Note (Signed)
Transition of Care [facility]) - Progression Note    Patient Details  Name: Stacey Velazquez MRN: [id number] Date of Birth: 21-Jun-1945  Transition of Care [facility]) CM/SW Contact  Purcell Mouton, RN Phone Number: 01/22/2021, 12:09 PM  Clinical Narrative:    SNF bed offers given to pt and daughter at bedside.   Expected Discharge Plan: [place] Barriers to Discharge: No Barriers Identified  Expected Discharge Plan and Services Expected Discharge Plan: [place] arrangements for the past 2 months: Single Family Home                                       Social Determinants of Health (SDOH) Interventions    Readmission Risk Interventions No flowsheet data found.

## 2021-01-22 NOTE — Progress Notes (Signed)
Physical Therapy Treatment Patient Details Name: Stacey Velazquez MRN: [id number] DOB: 1945-02-17 Today's Date: 01/22/2021   History of Present Illness Pt is a 75 y.o. female who presented to [facility] ED with progressively worsening abdominal distention for the past month associated with worsening dyspnea, lower extremity edema and abdominal pain that excalated. Associated with few days of melena and hematochezia.  No nausea or vomiting. Paracentisis done on 01/12/2021 with 3.5 L of fluid removed by IR.  Abdominal fluid not suggestive of SBP. PMH significant for  autoimmune hepatitis,  with abdominal ascites, anemia of chronic disease, colon polyps, anxiety, bilateral lower extremity edema, bipolar disorder with insomnia, cutaneous horn, drug-induced parkinsonism, esophageal varices, fibromyalgia, gastric ulcer, GERD, gingival abscess, glaucoma, CAD, history of MI, hypothyroidism, migraine headaches, neutropenia, osteoporosis, paranoid schizophrenia, polyarthritis, rectal mass, seizure disorder, sinus arrest, sick sinus syndrome, pacemaker placement, history of nonhemorrhagic stroke, thrombocytopenia    PT Comments    Mod assist for sit to stand from elevated bed. Then min assist to take a few pivotal steps to recliner. Pt performed RLE exercises. She reported she could not tolerate LLE exercises 2* pain. Daughter reports pt injured L knee over 30 years ago, and more recently fell onto L knee. Pt requires frequent redirection to stay on task.   Recommendations for follow up therapy are one component of a multi-disciplinary discharge planning process, led by the attending physician.  Recommendations may be updated based on patient status, additional functional criteria and insurance authorization.  Follow Up Recommendations  Skilled nursing-short term rehab (<3 hours/day)     Assistance Recommended at Discharge Frequent or constant Supervision/Assistance  Equipment Recommendations  None recommended by PT     Recommendations for Other Services       Precautions / Restrictions Precautions Precautions: Fall Precaution Comments: dizziness vs vertigo Restrictions Weight Bearing Restrictions: No     Mobility  Bed Mobility Overal bed mobility: Needs Assistance Bed Mobility: Supine to Sit     Supine to sit: Min assist;HOB elevated     General bed mobility comments: min A to raise trunk, increased time, used bed rail    Transfers Overall transfer level: Needs assistance Equipment used: Rolling walker (2 wheels) Transfers: Bed to chair/wheelchair/BSC;Sit to/from Stand Sit to Stand: From elevated surface;Mod assist     Step pivot transfers: Min assist     General transfer comment: mod A to power up from elevated bed, increased time to take a few pivotal steps to recliner, activity tolerance limited by fatigue    Ambulation/Gait                   Stairs             Wheelchair Mobility    Modified Rankin (Stroke Patients Only)       Balance Overall balance assessment: Needs assistance Sitting-balance support: Feet supported;Bilateral upper extremity supported Sitting balance-Leahy Scale: Fair     Standing balance support: Bilateral upper extremity supported;Reliant on assistive device for balance Standing balance-Leahy Scale: Poor                              Cognition Arousal/Alertness: Awake/alert Behavior During Therapy: WFL for tasks assessed/performed Overall Cognitive Status: Within Functional Limits for tasks assessed                                 General Comments: A&Ox4.  Very talkative and needs redirection.        Exercises  Long arc quads RLE x 10 AROM seated Ankle pumps RLE x 10 AROM seated     General Comments        Pertinent Vitals/Pain Pain Score: 0-No pain Faces Pain Scale: Hurts little more Pain Location: LLE with movement and "all of my joints" Pain Descriptors / Indicators:  Discomfort;Sore;Shooting;Stabbing    Home Living                          Prior Function            PT Goals (current goals can now be found in the care plan section) Acute Rehab PT Goals Patient Stated Goal: Have patience and be able to improve movement with less dizziness PT Goal Formulation: With patient/family Time For Goal Achievement: 01/29/21 Potential to Achieve Goals: Good Progress towards PT goals: Progressing toward goals    Frequency    Min 3X/week      PT Plan Current plan remains appropriate    Co-evaluation              AM-PAC PT "6 Clicks" Mobility   Outcome Measure  Help needed turning from your back to your side while in a flat bed without using bedrails?: None Help needed moving from lying on your back to sitting on the side of a flat bed without using bedrails?: A Lot Help needed moving to and from a bed to a chair (including a wheelchair)?: A Lot Help needed standing up from a chair using your arms (e.g., wheelchair or bedside chair)?: A Lot Help needed to walk in hospital room?: Total Help needed climbing 3-5 steps with a railing? : Total 6 Click Score: 12    End of Session Equipment Utilized During Treatment: Gait belt Activity Tolerance: Patient tolerated treatment well Patient left: in chair;with call bell/phone within reach;with chair alarm set;with family/visitor present Nurse Communication: Mobility status PT Visit Diagnosis: Pain;Other abnormalities of gait and mobility (R26.89) Pain - Right/Left: Left Pain - part of body: Knee     Time: 1203-1223 PT Time Calculation (min) (ACUTE ONLY): 20 min  Charges:  $Therapeutic Activity: 8-22 mins                     Stacey Velazquez PT 01/22/2021  Acute Rehabilitation Services Pager [phone] Office [phone]

## 2021-01-22 NOTE — Progress Notes (Signed)
Progress Note   Patient: Stacey Velazquez:[id number] DOB: 28-Feb-1945 DOA: 01/12/2021     9 DOS: the patient was seen and examined on 01/22/2021       Brief hospital course: Stacey Velazquez is a 75 y.o. F with cirrhosis due to AIH, SSS s/p PPM, CAD s/p remote PCI, CVA without residuals, CKD who presented with rectal bleeding, melena, and abdominal distension/BLE swelling.   In the ER, she was found to have a Hb drop from 10 > 7, elevated INR and ascites.     12/19: Admitted, GI consulted for GI bleeding, underwent paracentesis 3.5L fluid 12/20: EGD colon showed varices, gastritis but no sitgmata of recent bleeding, flex sig with stool, but no source identified, no melena in colon 12/21: Hgb down to 6.7 g/dL, transfused 1 unit; Liver doppler negative for thrombosis, mentation worsening --> albumin started 12/22: Repeat paracentesis again 3L, no SBP again, mentation and renal function improved with albumin, vit K 12/23: Steroids started 12/24-27: Improving on steroids but Hgb trending down  CODE STATUS changed on 01/21/2021 to DNR.  01/22/2021: Patient was seen at bedside.  No new complaints.  Plan to DC to SNF on 01/23/2021.  COVID-19 screening test pending.   Assessment and Plan * Autoimmune hepatitis ([place])- (present on admission) Esophageal varices determined by endoscopy ([place])- (present on admission) Ascites of liver- (present on admission) Decompensated hepatic cirrhosis Coagulopathy due to Cirrhosis  Initially admitted for bleeding, but now decompensated cirrhosis prevents discharge.  MELD 27 Completed 3d vitamin K, 7d Cipro for SBP PPx  Discussed with Palliative Care today  - Continue prednisone 20, plan for no taper at d/c - Continue PPI, midodrine  - Hold diuretics until Cr trend clearer  - Consult GI, appreciate cares - Consult Palliative Care        Acute on chronic blood loss anemia  Anemia associated with chemotherapy  Chronic blood loss anemia   Iron replete  Trending down, in the 7s today.  No melena.  -Continue ppi - Check iron studies in 2 months  Pancytopenia ([place])- (present on admission) Multifactorial.  ANC down to <500 at one point.  Heme consulted, Granix given, ANC recovered. Neutropenia largely sequestration, drug induced, BM failure.  Anemia largely chronic blood loss.   Thrombocytopenia largely sequestration.  Transfused 1 unit platelets on 12/20 and 1 unit PRBCs on 12/21, none since    AKI (acute kidney injury) ([place]) It seems her baseline Cr has been in the 0.9 mg/dL range, wtihin the last 6 months, has fluctuated a lot recently.  Was recently up >2 mg/dL in setting of tacrolimus, more recently down to 1.2-1.3 here, and as high as 1.6 mg/dL.  HRS ruled out, nephrology consulted.  Cr better at 1.4 today.  Urine sodium low again. Given her liver disease 1.0-1.5 is probably within the range of her normal.    - Continue midodrine - Nephrology follow up needed at d/c     Hypothyroidism- (present on admission) -Continue levothyroxine    Resolved issues: Hyponatremia- (present on admission) -Continue Fluid restriction  Acute metabolic encephalopathy- (present on admission) Patient noted to have sluggishness, decreased responsiveness earlier in hospital stay.  This resolved with albumin, paracentesis and starting steroids.  Now at baseline.  Vertigo CT head unremarkable.  Has had BPPV in the past. Vertigo now completely resolved.    Subjective: No melena.  No fever.  No confusion.  No dysuria, no cough.  1 soft brown bowel movement overnight.  Objective Vital signs were reviewed  and unremarkable. General appearance: Elderly adult female, sitting up in bed, interactive, no acute distress     HEENT: Partially edentulous, lips dry, no oral lesions, hearing normal Skin: No suspicious rashes or lesions, no icterus, no jaundice Cardiac: RRR, no murmurs, 1+ bilateral nonpitting edema,  bilaterally Respiratory: Normal respiratory rate and rhythm, lungs clear without rales or wheezes Abdomen: Abdomen soft, mild distention and ascites, not tense, no tenderness. MSK:  Neuro: Awake and alert, extraocular movements intact, face symmetric, speech fluent, keeps the right eye closed because of her diplopia.  [name]. Psych: Attention normal, affect normal, judgment insight appear normal   Data Reviewed: Review of the basic metabolic panel and complete blood count is reviewed above, INR trending back up.  Family Communication: Daughter [name] at the bedside  Disposition: Status is: Inpatient  Remains inpatient appropriate because:   Her hemoglobin and INR are trending in the wrong direction.    I suspect with her portal gastropathy combined with coagulopathy, she will not be able to produce blood fast enough to replace what is lost.    TIPS to reduce portal gastropathy would risk HE in this elderly patient with some mild cognitive impairment already.  She is not a transplant candidate, and if her blood loss, coagulaopathy, and renal disease do not improve with steroids and supportive cares, it may be that she needs to return home with hospice soon.  At present, her best hope is that things stabilize, she can rehab and then discharge to home with some improvement of function, to spend some time with family.      Author: Kayleen Memos 01/22/2021 6:26 PM  For on call review [email or web address].

## 2021-01-23 DIAGNOSIS — Z950 Presence of cardiac pacemaker: Secondary | ICD-10-CM | POA: Diagnosis not present

## 2021-01-23 DIAGNOSIS — R41841 Cognitive communication deficit: Secondary | ICD-10-CM | POA: Diagnosis not present

## 2021-01-23 DIAGNOSIS — F319 Bipolar disorder, unspecified: Secondary | ICD-10-CM | POA: Diagnosis present

## 2021-01-23 DIAGNOSIS — E871 Hypo-osmolality and hyponatremia: Secondary | ICD-10-CM | POA: Diagnosis not present

## 2021-01-23 DIAGNOSIS — R531 Weakness: Secondary | ICD-10-CM | POA: Diagnosis not present

## 2021-01-23 DIAGNOSIS — R1012 Left upper quadrant pain: Secondary | ICD-10-CM | POA: Diagnosis not present

## 2021-01-23 DIAGNOSIS — D649 Anemia, unspecified: Secondary | ICD-10-CM | POA: Diagnosis not present

## 2021-01-23 DIAGNOSIS — G40909 Epilepsy, unspecified, not intractable, without status epilepticus: Secondary | ICD-10-CM | POA: Diagnosis present

## 2021-01-23 DIAGNOSIS — R609 Edema, unspecified: Secondary | ICD-10-CM | POA: Diagnosis not present

## 2021-01-23 DIAGNOSIS — G9341 Metabolic encephalopathy: Secondary | ICD-10-CM | POA: Diagnosis not present

## 2021-01-23 DIAGNOSIS — Z515 Encounter for palliative care: Secondary | ICD-10-CM | POA: Diagnosis not present

## 2021-01-23 DIAGNOSIS — I8500 Esophageal varices without bleeding: Secondary | ICD-10-CM | POA: Diagnosis not present

## 2021-01-23 DIAGNOSIS — D62 Acute posthemorrhagic anemia: Secondary | ICD-10-CM | POA: Diagnosis not present

## 2021-01-23 DIAGNOSIS — K8689 Other specified diseases of pancreas: Secondary | ICD-10-CM | POA: Diagnosis not present

## 2021-01-23 DIAGNOSIS — F200 Paranoid schizophrenia: Secondary | ICD-10-CM | POA: Diagnosis present

## 2021-01-23 DIAGNOSIS — E039 Hypothyroidism, unspecified: Secondary | ICD-10-CM | POA: Diagnosis not present

## 2021-01-23 DIAGNOSIS — N281 Cyst of kidney, acquired: Secondary | ICD-10-CM | POA: Diagnosis not present

## 2021-01-23 DIAGNOSIS — D684 Acquired coagulation factor deficiency: Secondary | ICD-10-CM | POA: Diagnosis present

## 2021-01-23 DIAGNOSIS — K7469 Other cirrhosis of liver: Secondary | ICD-10-CM | POA: Diagnosis not present

## 2021-01-23 DIAGNOSIS — R52 Pain, unspecified: Secondary | ICD-10-CM | POA: Diagnosis not present

## 2021-01-23 DIAGNOSIS — Z743 Need for continuous supervision: Secondary | ICD-10-CM | POA: Diagnosis not present

## 2021-01-23 DIAGNOSIS — K922 Gastrointestinal hemorrhage, unspecified: Secondary | ICD-10-CM | POA: Diagnosis not present

## 2021-01-23 DIAGNOSIS — N179 Acute kidney failure, unspecified: Secondary | ICD-10-CM | POA: Diagnosis not present

## 2021-01-23 DIAGNOSIS — R339 Retention of urine, unspecified: Secondary | ICD-10-CM | POA: Diagnosis not present

## 2021-01-23 DIAGNOSIS — R262 Difficulty in walking, not elsewhere classified: Secondary | ICD-10-CM | POA: Diagnosis not present

## 2021-01-23 DIAGNOSIS — K7290 Hepatic failure, unspecified without coma: Secondary | ICD-10-CM | POA: Diagnosis not present

## 2021-01-23 DIAGNOSIS — Z20822 Contact with and (suspected) exposure to covid-19: Secondary | ICD-10-CM | POA: Diagnosis present

## 2021-01-23 DIAGNOSIS — R1900 Intra-abdominal and pelvic swelling, mass and lump, unspecified site: Secondary | ICD-10-CM | POA: Diagnosis not present

## 2021-01-23 DIAGNOSIS — Z66 Do not resuscitate: Secondary | ICD-10-CM | POA: Diagnosis present

## 2021-01-23 DIAGNOSIS — J9811 Atelectasis: Secondary | ICD-10-CM | POA: Diagnosis not present

## 2021-01-23 DIAGNOSIS — I495 Sick sinus syndrome: Secondary | ICD-10-CM | POA: Diagnosis present

## 2021-01-23 DIAGNOSIS — M797 Fibromyalgia: Secondary | ICD-10-CM | POA: Diagnosis present

## 2021-01-23 DIAGNOSIS — I129 Hypertensive chronic kidney disease with stage 1 through stage 4 chronic kidney disease, or unspecified chronic kidney disease: Secondary | ICD-10-CM | POA: Diagnosis present

## 2021-01-23 DIAGNOSIS — D61818 Other pancytopenia: Secondary | ICD-10-CM | POA: Diagnosis not present

## 2021-01-23 DIAGNOSIS — F419 Anxiety disorder, unspecified: Secondary | ICD-10-CM | POA: Diagnosis not present

## 2021-01-23 DIAGNOSIS — R600 Localized edema: Secondary | ICD-10-CM | POA: Diagnosis not present

## 2021-01-23 DIAGNOSIS — I8510 Secondary esophageal varices without bleeding: Secondary | ICD-10-CM | POA: Diagnosis present

## 2021-01-23 DIAGNOSIS — K754 Autoimmune hepatitis: Secondary | ICD-10-CM | POA: Diagnosis not present

## 2021-01-23 DIAGNOSIS — N62 Hypertrophy of breast: Secondary | ICD-10-CM | POA: Diagnosis not present

## 2021-01-23 DIAGNOSIS — R2681 Unsteadiness on feet: Secondary | ICD-10-CM | POA: Diagnosis not present

## 2021-01-23 DIAGNOSIS — K7460 Unspecified cirrhosis of liver: Secondary | ICD-10-CM | POA: Diagnosis not present

## 2021-01-23 DIAGNOSIS — R188 Other ascites: Secondary | ICD-10-CM | POA: Diagnosis not present

## 2021-01-23 DIAGNOSIS — I10 Essential (primary) hypertension: Secondary | ICD-10-CM | POA: Diagnosis not present

## 2021-01-23 DIAGNOSIS — D689 Coagulation defect, unspecified: Secondary | ICD-10-CM | POA: Diagnosis not present

## 2021-01-23 DIAGNOSIS — G8929 Other chronic pain: Secondary | ICD-10-CM | POA: Diagnosis not present

## 2021-01-23 DIAGNOSIS — M6281 Muscle weakness (generalized): Secondary | ICD-10-CM | POA: Diagnosis not present

## 2021-01-23 DIAGNOSIS — I2510 Atherosclerotic heart disease of native coronary artery without angina pectoris: Secondary | ICD-10-CM | POA: Diagnosis not present

## 2021-01-23 DIAGNOSIS — R0602 Shortness of breath: Secondary | ICD-10-CM | POA: Diagnosis not present

## 2021-01-23 DIAGNOSIS — K766 Portal hypertension: Secondary | ICD-10-CM | POA: Diagnosis not present

## 2021-01-23 DIAGNOSIS — N184 Chronic kidney disease, stage 4 (severe): Secondary | ICD-10-CM | POA: Diagnosis present

## 2021-01-23 DIAGNOSIS — Z7409 Other reduced mobility: Secondary | ICD-10-CM | POA: Diagnosis not present

## 2021-01-23 DIAGNOSIS — R5381 Other malaise: Secondary | ICD-10-CM | POA: Diagnosis not present

## 2021-01-23 DIAGNOSIS — K921 Melena: Secondary | ICD-10-CM | POA: Diagnosis present

## 2021-01-23 DIAGNOSIS — I519 Heart disease, unspecified: Secondary | ICD-10-CM | POA: Diagnosis not present

## 2021-01-23 LAB — BASIC METABOLIC PANEL
Anion gap: 4 — ABNORMAL LOW (ref 5–15)
BUN: 42 mg/dL — ABNORMAL HIGH (ref 8–23)
CO2: 23 mmol/L (ref 22–32)
Calcium: 8.3 mg/dL — ABNORMAL LOW (ref 8.9–10.3)
Chloride: 107 mmol/L (ref 98–111)
Creatinine, Ser: 1.48 mg/dL — ABNORMAL HIGH (ref 0.44–1.00)
GFR, Estimated: 36 mL/min — ABNORMAL LOW (ref >60)
Glucose, Bld: 103 mg/dL — ABNORMAL HIGH (ref 70–99)
Potassium: 3.8 mmol/L (ref 3.5–5.1)
Sodium: 134 mmol/L — ABNORMAL LOW (ref 135–145)

## 2021-01-23 LAB — SARS CORONAVIRUS 2 (TAT 6-24 HRS): SARS Coronavirus 2: NEGATIVE

## 2021-01-23 MED ORDER — PROSOURCE PLUS PO LIQD: 30.0000 mL | Freq: Three times a day (TID) | ORAL | 0 refills | Status: AC

## 2021-01-23 MED ORDER — PREDNISONE 20 MG PO TABS
20.0000 mg | ORAL_TABLET | Freq: Every day | ORAL | 0 refills | Status: DC
Start: 2021-01-24 — End: 2021-02-17

## 2021-01-23 MED ORDER — MIDODRINE HCL 5 MG PO TABS: 5.0000 mg | ORAL_TABLET | Freq: Two times a day (BID) | ORAL | 0 refills | Status: DC

## 2021-01-23 MED ORDER — PANTOPRAZOLE SODIUM 40 MG PO TBEC: 40.0000 mg | DELAYED_RELEASE_TABLET | Freq: Two times a day (BID) | ORAL | 0 refills | Status: DC

## 2021-01-23 NOTE — Discharge Summary (Addendum)
Discharge Summary  ELKA SATTERFIELD VPX:[id number] DOB: [birth year]  PCP: Libby Maw, MD  Admit date: 01/12/2021 Discharge date: 01/23/2021  Time spent: 35 minutes  Recommendations for Outpatient Follow-up:  Follow-up with GI. Follow-up with medical oncology. Follow-up with your primary care provider. Take your medications as prescribed. Continue PT OT with assistance and fall precautions.  Discharge Diagnoses:  Active Hospital Problems   Diagnosis Date Noted   Ascites of liver 01/12/2021   Coagulopathy due to cirrhosis 01/20/2021   Acute on chronic blood loss anemia 01/18/2021   AKI (acute kidney injury) ([place]) 46/27/0350   Acute metabolic encephalopathy 09/38/1829   Neutropenia, drug-induced (HCC)    Anemia associated with chemotherapy 01/14/2021   Chronic blood loss anemia 01/12/2021   Hyponatremia 11/04/2020   Portal hypertensive gastropathy ([place]) 04/21/2020   Esophageal varices determined by endoscopy ([place]) 07/03/2019   Decompensated hepatic cirrhosis ([place]) 04/09/2019   Autoimmune hepatitis ([place])    Vertigo    Hypothyroidism 09/09/2014   Pancytopenia ([place]) 08/25/2014    Resolved Hospital Problems   Diagnosis Date Noted Date Resolved   Abdominal ascites 01/13/2021 01/18/2021    Discharge Condition: Stable.  Diet recommendation: Resume previous diet.  Vitals:   01/22/21 2143 01/23/21 0631  BP: (!) 116/91 123/77  Pulse: 73 82  Resp: 16 16  Temp: 97.8 F (36.6 C) 98.1 F (36.7 C)  SpO2: 100% 99%    History of present illness:  Stacey Velazquez is a 75 y.o. F with cirrhosis due to AIH, SSS s/p PPM, CAD s/p remote PCI, CVA without residuals, CKD who presented with rectal bleeding, melena, and abdominal distension/BLE swelling.    In the ER, she was found to have a Hb drop from 10K > 7K, elevated INR and ascites.   12/19: Admitted, GI consulted for GI bleeding, underwent paracentesis 3.5L fluid 12/20: EGD colon showed varices, gastritis but no  sitgmata of recent bleeding, flex sig with stool, but no source identified, no melena in colon 12/21: Hgb down to 6.7 g/dL, transfused 1 unit; Liver doppler negative for thrombosis, mentation worsening --> albumin started 12/22: Repeat paracentesis again 3L, no SBP again, mentation and renal function improved with albumin, vit K 12/23: Steroids started 12/24-27: Improving on steroids.   CODE STATUS changed on 01/21/2021 to DNR.   01/23/2021: Patient was seen at her bedside.  There were no acute events overnight.  She has no new complaints.  Hospital Course:  Principal Problem:   Ascites of liver Active Problems:   Pancytopenia (HCC)   Hypothyroidism   Vertigo   Autoimmune hepatitis ([place])   Decompensated hepatic cirrhosis (HCC)   Esophageal varices determined by endoscopy ([place])   Portal hypertensive gastropathy (HCC)   Hyponatremia   Chronic blood loss anemia   Anemia associated with chemotherapy   Neutropenia, drug-induced (HCC)   AKI (acute kidney injury) ([place])   Acute metabolic encephalopathy   Acute on chronic blood loss anemia   Coagulopathy due to cirrhosis  Autoimmune hepatitis ([place])- (present on admission) Esophageal varices determined by endoscopy ([place])- (present on admission) Ascites of liver- (present on admission) Decompensated hepatic cirrhosis Coagulopathy due to Cirrhosis   MELD 27 Completed 3d vitamin K, 7d Cipro for SBP PPx    - Continue prednisone 20, plan for no taper at d/c - Continue PPI, midodrine    -Seen by GI, appreciate cares -Seen by palliative Care   Acute on chronic blood loss anemia  Anemia associated with chemotherapy  Chronic blood loss anemia  Hemoglobin is  uptrending 10.0 from 9.1. -Continue ppi   Pancytopenia ([place])- (present on admission) Multifactorial.  ANC down to <500 at one point.  Heme consulted, Granix given, ANC recovered. Neutropenia largely sequestration, drug induced, BM failure.  Anemia largely chronic blood loss.    Thrombocytopenia largely sequestration.   Transfused 1 unit platelets on 01/13/21 and 1 unit PRBCs on 01/14/21, none since   AKI (acute kidney injury) ([place]) It seems her baseline Cr has been in the 0.9 mg/dL range, wtihin the last 6 months, has fluctuated a lot recently.  Was recently up >2 mg/dL in setting of tacrolimus, more recently down to 1.2-1.3 here, and as high as 1.6 mg/dL.  HRS ruled out, seen by nephrology - Continue midodrine Follow-up with nephrology   Hypothyroidism- (present on admission) -Continue levothyroxine   Resolved issues: Hyponatremia- (present on admission) -Continue Fluid restriction   Resolved acute metabolic encephalopathy- (present on admission) Patient noted to have sluggishness, decreased responsiveness earlier in hospital stay.  This resolved with albumin, paracentesis and starting steroids.  Now at baseline.   Vertigo CT head unremarkable.  Has had BPPV in the past. Vertigo now completely resolved.    Procedures: Paracentesis  Consultations: GI Medical oncology IR Nephrology  Discharge Exam: BP 123/77 (BP Location: Left Arm)    Pulse 82    Temp 98.1 F (36.7 C) (Oral)    Resp 16    Ht 5\' 9"  (1.753 m)    Wt 85.9 kg    SpO2 99%    BMI 27.97 kg/m  General: 75 y.o. year-old female well developed well nourished in no acute distress.  Alert and oriented x3. Cardiovascular: Regular rate and rhythm with no rubs or gallops.  No thyromegaly or JVD noted.   Respiratory: Clear to auscultation with no wheezes or rales. Good inspiratory effort. Abdomen: Soft nontender nondistended with normal bowel sounds x4 quadrants. Musculoskeletal: No lower extremity edema. 2/4 pulses in all 4 extremities. Skin: No ulcerative lesions noted or rashes, Psychiatry: Mood is appropriate for condition and setting  Discharge Instructions You were cared for by a hospitalist during your hospital stay. If you have any questions about your discharge medications or the care  you received while you were in the hospital after you are discharged, you can call the unit and asked to speak with the hospitalist on call if the hospitalist that took care of you is not available. Once you are discharged, your primary care physician will handle any further medical issues. Please note that NO REFILLS for any discharge medications will be authorized once you are discharged, as it is imperative that you return to your primary care physician (or establish a relationship with a primary care physician if you do not have one) for your aftercare needs so that they can reassess your need for medications and monitor your lab values.   Allergies as of 01/23/2021       Reactions   Misc. Throat Products Other (See Comments)   PT states that she is allergic to OTC  Flu, cold, sinus, allergy, gas, and GERD meds   Neomycin Nausea And Vomiting   Oxycodone-acetaminophen Rash   Ambien [zolpidem Tartrate] Other (See Comments)   Hallucinations   Amitriptyline Other (See Comments)   Stops heart per pt   Ampicillin Other (See Comments)   Stops heart per pt   Anaprox [naproxen Sodium] Other (See Comments)   Doesn't work per pt   Benadryl [diphenhydramine Hcl (sleep)] Other (See Comments)   Rash   Cetirizine &  Related Other (See Comments)   Swelling   Cortizone-10 [hydrocortisone] Other (See Comments)   seizures   Darvon [propoxyphene] Other (See Comments)   Increased heart rate per pt   Diazepam Other (See Comments)   Stops heart per pt   Diflunisal Swelling, Other (See Comments)   Stops heart per pt (Dolobid)   Duloxetine Other (See Comments)   Reaction to Cymbalta - pt doesn't remember what the reaction was   Flexeril [cyclobenzaprine] Other (See Comments)   Seizures   Lactose Intolerance (gi) Swelling, Other (See Comments)   cramping   Lidocaine Other (See Comments)   Seizures   Meperidine And Related Other (See Comments)   Stops heart rate (reaction to Demerol)   Metanx  [l-methylfolate-algae-b12-b6] Other (See Comments)   Pt does not remember reaction   Metoclopramide Other (See Comments)   Pt does not remember reaction   Morphine And Related Other (See Comments)   Stops heart per pt   Nuprin [ibuprofen] Other (See Comments)   Caused headache   Oxycodone Other (See Comments)   Hallucinations   Penicillins Other (See Comments)   Stops heart per pt   Propoxyphene Other (See Comments)   Slowed heart rate per pt (reaction to Darvocet)   Ranitidine Other (See Comments)   Pt does not remember reaction   Vicodin [hydrocodone-acetaminophen] Other (See Comments)   Seizures   Butenafine Hcl Rash   Tramadol Other (See Comments)   Tylenol [acetaminophen] Rash   Zolpidem Other (See Comments), Rash   Hallucinations        Medication List     STOP taking these medications    dexlansoprazole 60 MG capsule Commonly known as: Dexilant   furosemide 20 MG tablet Commonly known as: LASIX   linaclotide 145 MCG Caps capsule Commonly known as: Linzess   spironolactone 25 MG tablet Commonly known as: ALDACTONE   tacrolimus 1 MG capsule Commonly known as: PROGRAF       TAKE these medications    (feeding supplement) PROSource Plus liquid Take 30 mLs by mouth 3 (three) times daily between meals for 7 days.   Digestive Adv Digestive/Immune Chew Chew 2 capsules by mouth at bedtime.   DRY SKIN EX Apply 1 application topically daily. Apply to both elbows, knee caps, and feet   famotidine 20 MG tablet Commonly known as: PEPCID Take 1 tablet (20 mg total) by mouth 2 (two) times daily.   levothyroxine 50 MCG tablet Commonly known as: SYNTHROID Take 1 tablet (50 mcg total) by mouth daily.   midodrine 5 MG tablet Commonly known as: PROAMATINE Take 1 tablet (5 mg total) by mouth 2 (two) times daily with a meal.   pantoprazole 40 MG tablet Commonly known as: PROTONIX Take 1 tablet (40 mg total) by mouth 2 (two) times daily for 20 days.    polyethylene glycol 17 g packet Commonly known as: MIRALAX / GLYCOLAX Take 8.5 g by mouth daily as needed for severe constipation.   predniSONE 20 MG tablet Commonly known as: DELTASONE Take 1 tablet (20 mg total) by mouth daily with breakfast. Start taking on: January 24, 2021   tamsulosin 0.4 MG Caps capsule Commonly known as: FLOMAX Take 0.4 mg by mouth at bedtime.       Allergies  Allergen Reactions   Misc. Throat Products Other (See Comments)    PT states that she is allergic to OTC  Flu, cold, sinus, allergy, gas, and GERD meds   Neomycin Nausea And Vomiting  Oxycodone-Acetaminophen Rash   Ambien [Zolpidem Tartrate] Other (See Comments)    Hallucinations    Amitriptyline Other (See Comments)    Stops heart per pt   Ampicillin Other (See Comments)    Stops heart per pt    Anaprox [Naproxen Sodium] Other (See Comments)    Doesn't work per pt   Benadryl [Diphenhydramine Hcl (Sleep)] Other (See Comments)    Rash   Cetirizine & Related Other (See Comments)    Swelling    Cortizone-10 [Hydrocortisone] Other (See Comments)    seizures    Darvon [Propoxyphene] Other (See Comments)    Increased heart rate per pt    Diazepam Other (See Comments)    Stops heart per pt   Diflunisal Swelling and Other (See Comments)    Stops heart per pt (Dolobid)   Duloxetine Other (See Comments)    Reaction to Cymbalta - pt doesn't remember what the reaction was   Flexeril [Cyclobenzaprine] Other (See Comments)    Seizures    Lactose Intolerance (Gi) Swelling and Other (See Comments)    cramping   Lidocaine Other (See Comments)    Seizures   Meperidine And Related Other (See Comments)    Stops heart rate (reaction to Demerol)   Metanx [L-Methylfolate-Algae-B12-B6] Other (See Comments)    Pt does not remember reaction   Metoclopramide Other (See Comments)    Pt does not remember reaction   Morphine And Related Other (See Comments)    Stops heart per pt    Nuprin  [Ibuprofen] Other (See Comments)    Caused headache   Oxycodone Other (See Comments)    Hallucinations    Penicillins Other (See Comments)    Stops heart per pt    Propoxyphene Other (See Comments)    Slowed heart rate per pt (reaction to Darvocet)   Ranitidine Other (See Comments)    Pt does not remember reaction   Vicodin [Hydrocodone-Acetaminophen] Other (See Comments)    Seizures    Butenafine Hcl Rash   Tramadol Other (See Comments)   Tylenol [Acetaminophen] Rash   Zolpidem Other (See Comments) and Rash    Hallucinations     Contact information for follow-up providers     [facility].   Contact information: [place] [address] [phone]         Libby Maw, MD Follow up.   Specialty: Family Medicine Contact information: [place] [address] [phone]         [name], MD .   Specialty: Cardiology Contact information: [phone] [address] [place] [address] [phone]         Nicholas Lose, MD. Call today.   Specialty: Hematology and Oncology Why: Please call for a posthospital follow-up appointment. Contact information: [place] [address] [phone]         Sharyn Creamer, MD. Call today.   Specialty: Gastroenterology Why: Please call for a posthospital follow-up appointment. Contact information: [place] [address] [phone]         Gean Quint, MD. Call today.   Specialty: Nephrology Why: Please call for a posthospital follow-up appointment. Contact information: [place] [address] [phone]              Contact information for after-discharge care     [facility] .  Service: Skilled Nursing Contact information: [place] [phone]                      The results of significant diagnostics from this hospitalization (including imaging, microbiology, ancillary and laboratory) are listed below for reference.    Significant Diagnostic Studies: CT Abdomen Pelvis Wo Contrast  Result Date: 01/12/2021 CLINICAL DATA:  Abdominal pain and dyspnea since last night, dark stools, blood in stool, abdominal distension for 1 month; history cirrhosis, drug-induced parkinsonism, GERD, stage IV chronic kidney disease EXAM: CT ABDOMEN AND PELVIS WITHOUT CONTRAST TECHNIQUE: Multidetector CT imaging of the abdomen and pelvis was performed following the standard protocol without IV contrast. COMPARISON:  11/14/2019 FINDINGS: Lower chest: Lung bases clear.  RIGHT ventricular pacemaker lead. Hepatobiliary: Liver small without focal mass. Post cholecystectomy. Pancreas: Atrophic pancreas without mass Spleen: Normal appearance Adrenals/Urinary Tract: Adrenal glands normal appearance. Peripelvic cyst central LEFT kidney again seen 2.6 x 2.8 cm. Tiny hyperdense nodule lower LEFT kidney laterally, corresponding to a small cyst seen on the previous exam, decreased in size but increased in attenuation. Bladder decompressed. Stomach/Bowel: Displacement of bowel loops by ascites. Sigmoid diverticulosis without evidence of diverticulitis. Stomach and remaining bowel loops unremarkable. Vascular/Lymphatic: Atherosclerotic calcifications aorta and iliac arteries without aneurysm. No adenopathy. Perisplenic collaterals. Reproductive: Uterus surgically absent. Nodular soft tissue foci in the pelvis bilaterally likely represent the ovaries as noted on prior exam. Other: Marked ascites. Diffuse soft tissue edema. No free air. No hernia. Musculoskeletal: Osseous demineralization. Degenerative changes LEFT hip joint. IMPRESSION: Marked ascites. Small liver consistent with known cirrhosis with note of perisplenic collaterals. Sigmoid  diverticulosis without evidence of diverticulitis. Aortic Atherosclerosis (ICD10-I70.0). Electronically Signed   By: Lavonia Dana M.D.   On: 01/12/2021 13:23   CT HEAD WO CONTRAST (5MM)  Result Date: 01/16/2021 CLINICAL DATA:  Dizziness.  Confusion. EXAM: CT HEAD WITHOUT CONTRAST TECHNIQUE: Contiguous axial images were obtained from the base of the skull through the vertex without intravenous contrast. COMPARISON:  CT head 08/21/2014 FINDINGS: Brain: Generalized atrophy with mild progression. Hypodensity throughout the cerebral white matter bilaterally unchanged. Hypodensity right lateral basal ganglia unchanged. Negative for acute infarct, hemorrhage, mass. Vascular: Negative for hyperdense vessel Skull: Negative Sinuses/Orbits: Paranasal sinuses clear.  Negative orbit Other: None IMPRESSION: No acute abnormality. Atrophy and chronic microvascular ischemic change. Electronically Signed   By: Franchot Gallo M.D.   On: 01/16/2021 13:33   US Paracentesis  Result Date: 01/15/2021 INDICATION: Patient with a history of autoimmune hepatitis and recurrent ascites. Interventional radiology asked to perform a therapeutic and diagnostic paracentesis up to 3.5 L. EXAM: ULTRASOUND GUIDED PARACENTESIS MEDICATIONS: 1% tetracaine 2 mL COMPLICATIONS: None immediate. PROCEDURE: Informed written consent was obtained from the patient after a discussion of the risks, benefits and alternatives to treatment. A timeout was performed prior to the initiation of the procedure. Initial ultrasound scanning demonstrates a large amount of ascites within the left lower abdominal quadrant. The left lower abdomen was prepped and draped in the usual sterile fashion. 1% tetracaine 2 mL was used for local anesthesia. Following this, a 19 gauge, 7-cm, Yueh catheter was introduced. An ultrasound image was saved for documentation purposes. The paracentesis was performed. The catheter was removed and a dressing was applied. The patient tolerated  the procedure well without immediate post procedural complication. FINDINGS: A total of approximately 3.5 L of clear yellow fluid was removed. Samples were sent to the laboratory as requested by the clinical  team. IMPRESSION: Successful ultrasound-guided paracentesis yielding 3.5 liters of peritoneal fluid. Read by: Soyla Dryer, NP Electronically Signed   By: Ruthann Cancer M.D.   On: 01/15/2021 14:25   US Paracentesis  Result Date: 01/12/2021 INDICATION: Autoimmune hepatitis.  Ascites. EXAM: ULTRASOUND GUIDED  PARACENTESIS MEDICATIONS: None. COMPLICATIONS: None immediate. PROCEDURE: Informed written consent was obtained from the patient after a discussion of the risks, benefits and alternatives to treatment. A timeout was performed prior to the initiation of the procedure. Initial ultrasound scanning demonstrates a large amount of ascites within the right lower abdominal quadrant. The right lower abdomen was prepped and draped in the usual sterile fashion. 1% tetracaine was used for local anesthesia. Following this, a Yueh catheter was introduced. An ultrasound image was saved for documentation purposes. The paracentesis was performed. The catheter was removed and a dressing was applied. The patient tolerated the procedure well without immediate post procedural complication. Patient received post-procedure intravenous albumin; see nursing notes for details. Procedure was performed by Rowe Robert, PA-C. FINDINGS: A total of approximately 3.5 L of yellow fluid was removed. Samples were sent to the laboratory as requested by the clinical team. IMPRESSION: Successful ultrasound-guided paracentesis yielding 3.5 liters of peritoneal fluid. Electronically Signed   By: Markus Daft M.D.   On: 01/12/2021 17:34   DG Chest Port 1 View  Result Date: 01/12/2021 CLINICAL DATA:  Shortness of breath EXAM: PORTABLE CHEST 1 VIEW COMPARISON:  Chest radiograph dated August 31, 2019 FINDINGS: The heart size and mediastinal  contours are within normal limits. Pacemaker leads terminating in the right atrium and right ventricle. Both lungs are clear. The visualized skeletal structures are unremarkable. IMPRESSION: No active disease. Electronically Signed   By: Keane Police D.O.   On: 01/12/2021 12:57   DG Abd Portable 1V  Result Date: 01/14/2021 CLINICAL DATA:  Abdominal pain and distension. History of cirrhosis with ascites. EXAM: PORTABLE ABDOMEN - 1 VIEW COMPARISON:  Radiographs 08/21/2014.  CT 01/12/2021. FINDINGS: 1455 hours. The bowel gas pattern is nonobstructive. There is central displacement of the bowel consistent with known underlying ascites. Mild bowel wall thickening, similar to recent CT and likely related to ascites/liver disease as well. No supine evidence of free intraperitoneal air. Surgical clips are present related to prior cholecystectomy with a stable surgical clip in the right pelvis. Advanced asymmetric left hip osteoarthritis noted. IMPRESSION: No evidence of acute abdominal process. Known findings related to previously demonstrated ascites. Electronically Signed   By: Richardean Sale M.D.   On: 01/14/2021 15:08   US LIVER DOPPLER  Result Date: 01/14/2021 CLINICAL DATA:  Ascites, rule out portal or hepatic venous thrombosis EXAM: DUPLEX ULTRASOUND OF LIVER TECHNIQUE: Color and duplex Doppler ultrasound was performed to evaluate the hepatic in-flow and out-flow vessels. COMPARISON:  CT 01/12/2021 and previous FINDINGS: Liver: Nodular contour. No focal lesion, mass or intrahepatic biliary ductal dilatation. Main Portal Vein size: 1.4 cm Portal Vein Velocities (all hepatopetal): Main Prox:  36 cm/sec Main Mid: 22 cm/sec Main Dist:  25 cm/sec Right: 15 cm/sec Left: 18 cm/sec Hepatic Vein Velocities (all hepatofugal): Right:  56 cm/sec Middle:  53 cm/sec Left:  64 cm/sec IVC: Present and patent with normal respiratory phasicity. Velocity 22 cm/sec Hepatic Artery Velocity:  59 cm/sec Splenic Vein Velocity:   20 cm/sec Spleen: 13.3 cm x 6.1 cm x 14.9 cm with a total volume of 630 cm^3 (411 cm^3 is upper limit normal) Portal Vein Occlusion/Thrombus: No Splenic Vein Occlusion/Thrombus: No Ascites: Present Varices: None identified  IMPRESSION: 1. Negative for hepatic or portal venous thrombosis. 2. Nodular hepatic contour suggesting cirrhosis, without focal lesion. 3. Splenomegaly and ascites suggesting portal venous hypertension. Electronically Signed   By: Lucrezia Europe M.D.   On: 01/14/2021 10:30    Microbiology: Recent Results (from the past 240 hour(s))  Urine Culture     Status: Abnormal   Collection Time: 01/15/21 10:37 AM   Specimen: Urine, Clean Catch  Result Value Ref Range Status   Specimen Description   Final    URINE, CLEAN CATCH Performed at [facility], [place] [address], [place], [place] [address]    Special Requests   Final    NONE Performed at [facility], [place] [address] [place], [place] [address]    Culture MULTIPLE SPECIES PRESENT, SUGGEST RECOLLECTION (A)  Final   Report Status 01/16/2021 FINAL  Final  Culture, blood (Routine X 2) w Reflex to ID Panel     Status: None   Collection Time: 01/15/21 10:57 AM   Specimen: BLOOD LEFT FOREARM  Result Value Ref Range Status   Specimen Description   Final    BLOOD LEFT FOREARM BLOOD Performed at [facility] [address] [place], [place] [address]    Special Requests   Final    Blood Culture adequate volume BOTTLES DRAWN AEROBIC ONLY Performed at [facility] [address] [place], [place] [address]    Culture   Final    NO GROWTH 5 DAYS Performed at [facility], [place] [address], [place], [place] [address]    Report Status 01/20/2021 FINAL  Final  Culture, blood (Routine X 2) w Reflex to ID Panel     Status: None   Collection Time: 01/15/21 10:57 AM   Specimen: Left Antecubital; Blood  Result Value Ref Range Status   Specimen Description    Final    LEFT ANTECUBITAL BLOOD Performed at [facility] [address], [place], [place] [address]    Special Requests   Final    Blood Culture adequate volume BOTTLES DRAWN AEROBIC AND ANAEROBIC Performed at [facility] [address] [place], [place] [address]    Culture   Final    NO GROWTH 5 DAYS Performed at [facility], [place] [address], [place], [place] [address]    Report Status 01/20/2021 FINAL  Final  Body fluid culture w Gram Stain     Status: None   Collection Time: 01/15/21  4:24 PM   Specimen: Peritoneal Washings  Result Value Ref Range Status   Specimen Description   Final    PERITONEAL Performed at [facility] [address], [place], [place] [address]    Special Requests   Final    NONE Performed at [facility], [place] [address] [place], [place] [address]    Gram Stain NO ORGANISMS SEEN  Final   Culture   Final    NO GROWTH 3 DAYS Performed at [facility], [place] [address], [place],  [address]    Report Status 01/19/2021 FINAL  Final  SARS CORONAVIRUS 2 (TAT 6-24 HRS) Nasopharyngeal Nasopharyngeal Swab     Status: None   Collection Time: 01/22/21 12:51 PM   Specimen: Nasopharyngeal Swab  Result Value Ref Range Status   SARS Coronavirus 2 NEGATIVE NEGATIVE Final    Comment: (NOTE) SARS-CoV-2 target nucleic acids are NOT DETECTED.  The SARS-CoV-2 RNA is generally detectable  in upper and lower respiratory specimens during the acute phase of infection. Negative results do not preclude SARS-CoV-2 infection, do not rule out co-infections with other pathogens, and should not be used as the sole basis for treatment or other patient management decisions. Negative results must be combined with clinical observations, patient history, and epidemiological information. The expected result is Negative.  Fact Sheet for  Patients: SugarRoll.be  Fact Sheet for Healthcare Providers: https://www.woods-mathews.com/  This test is not yet approved or cleared by the [place] FDA and  has been authorized for detection and/or diagnosis of SARS-CoV-2 by FDA under an Emergency Use Authorization (EUA). This EUA will remain  in effect (meaning this test can be used) for the duration of the COVID-19 declaration under Se ction 564(b)(1) of the Act, 21 U.S.C. section 360bbb-3(b)(1), unless the authorization is terminated or revoked sooner.  Performed at [facility], [place] [address] [place], [place] [address]      Labs: Basic Metabolic Panel: Recent Labs  Lab 01/19/21 0402 01/20/21 0403 01/21/21 0402 01/22/21 0929 01/23/21 0430  NA 133* 137 133* 133* 134*  K 3.8 3.8 3.5 3.0* 3.8  CL 106 106 106 106 107  CO2 21* 24 22 23 23   GLUCOSE 104* 103* 97 98 103*  BUN 30* 37* 42* 39* 42*  CREATININE 1.75* 1.43* 1.22* 1.24* 1.48*  CALCIUM 8.3* 8.5* 8.5* 8.1* 8.3*  MG  --   --   --  2.0  --   PHOS  --   --   --  3.8  --    Liver Function Tests: Recent Labs  Lab 01/18/21 0423 01/19/21 0402 01/21/21 0402 01/22/21 0929  AST 36 49* 61* 74*  ALT 24 31 42 55*  ALKPHOS 71 76 65 70  BILITOT 1.4* 1.2 1.5* 1.5*  PROT 5.3* 5.3* 5.4* 5.6*  ALBUMIN 3.1* 3.0* 3.4* 3.3*   No results for input(s): LIPASE, AMYLASE in the last 168 hours. No results for input(s): AMMONIA in the last 168 hours. CBC: Recent Labs  Lab 01/18/21 0423 01/19/21 0402 01/20/21 0403 01/21/21 0402 01/22/21 0929  WBC 3.0* 3.6* 1.8* 1.7* 2.2*  NEUTROABS  --   --   --   --  1.3*  HGB 9.2* 9.9* 7.9* 9.1* 10.0*  HCT 26.9* 29.2* 23.2* 25.8* 28.8*  MCV 90.3 90.7 89.9 88.1 89.2  PLT 48* 46* 41* 44* 44*   Cardiac Enzymes: No results for input(s): CKTOTAL, CKMB, CKMBINDEX, TROPONINI in the last 168 hours. BNP: BNP (last 3 results) No results for input(s): BNP in the last 8760 hours.  ProBNP  (last 3 results) No results for input(s): PROBNP in the last 8760 hours.  CBG: No results for input(s): GLUCAP in the last 168 hours.      Signed:  Kayleen Memos, MD [facility] 01/23/2021, 12:29 PM

## 2021-01-23 NOTE — Progress Notes (Signed)
Report called to Maudie Mercury at [facility], [facility] dispatched, waiting for pickup. SRP,RN

## 2021-01-23 NOTE — Plan of Care (Signed)

## 2021-01-23 NOTE — Progress Notes (Signed)
Occupational Therapy Treatment Patient Details Name: Stacey Velazquez MRN: [id number] DOB: [birth year] Today's Date: 01/23/2021   History of present illness Pt is a 75 y.o. female who presented to [facility] ED with progressively worsening abdominal distention for the past month associated with worsening dyspnea, lower extremity edema and abdominal pain that excalated. Associated with few days of melena and hematochezia.  No nausea or vomiting. Paracentisis done on 01/12/2021 with 3.5 L of fluid removed by IR.  Abdominal fluid not suggestive of SBP. PMH significant for  autoimmune hepatitis,  with abdominal ascites, anemia of chronic disease, colon polyps, anxiety, bilateral lower extremity edema, bipolar disorder with insomnia, cutaneous horn, drug-induced parkinsonism, esophageal varices, fibromyalgia, gastric ulcer, GERD, gingival abscess, glaucoma, CAD, history of MI, hypothyroidism, migraine headaches, neutropenia, osteoporosis, paranoid schizophrenia, polyarthritis, rectal mass, seizure disorder, sinus arrest, sick sinus syndrome, pacemaker placement, history of nonhemorrhagic stroke, thrombocytopenia   OT comments  Pt preparing for transfer to [facility]/re[facility] today, but asks for OT assist for dressing with reacher as pt uses one at baseline. Reacher brought to room and pt able to use for donning clothing over feet with Mod As for diaper brief and Min As for pants, then stood and pulled up with Mod As, holding bed with unilateral UE support.  Pt overall with improved safety awareness and attention to task and topic today. Daughter present which may help pt focus. Pt also without mention of dizziness or vertigo this visit, compared to last visit which was very limiting to pt's tolerance for therapy.  Pt left supine with daughter present.     Recommendations for follow up therapy are one component of a multi-disciplinary discharge planning process, led by the attending physician.  Recommendations may be  updated based on patient status, additional functional criteria and insurance authorization.    Follow Up Recommendations  Skilled nursing-short term rehab (<3 hours/day)    Assistance Recommended at Discharge Frequent or constant Supervision/Assistance  Equipment Recommendations       Recommendations for Other Services      Precautions / Restrictions Precautions Precautions: Fall Precaution Comments: dizziness vs vertigo Restrictions Weight Bearing Restrictions: No Other Position/Activity Restrictions: No /co dizziness today       Mobility Bed Mobility Overal bed mobility: Needs Assistance Bed Mobility: Sit to Supine       Sit to supine: Min guard;HOB elevated   General bed mobility comments: Min guard to guide LEs onto bed with pt asking to try herself with ~3 attempts and increased time, use of bed rail.    Transfers Overall transfer level:  (Please see ADL section for OT mobility.)                       Balance     Sitting balance-Leahy Scale: Fair       Standing balance-Leahy Scale: Poor                             ADL either performed or assessed with clinical judgement   ADL Overall ADL's : Needs assistance/impaired                     Lower Body Dressing: Moderate assistance;Sitting/lateral leans;Sit to/from stand Lower Body Dressing Details (indicate cue type and reason): Reacher brought to room to assist pt for dressing for transition to SNF. Pt uses a reacher at baseline.  Pt used reacher to don diaper brief with Mod  As over feet and pants with Min As over feet and increased time. Pt then stood with need of Mod As to pull up brief and pants over hips with pt using one arm on RW and one to pull. Toilet Transfer: Nurse, learning disability Details (indicate cue type and reason): Pt stood from [facility] (on [facility] with daughter in room as OT entered) with Min As, pulling up on bed rail with RUE, declining RW. Pt able to  "furniture cruise" with each hand holding bed, to pivot around and sit EOB with Min-Mod As of 1. Toileting- Clothing Manipulation and Hygiene: Total assistance;Sit to/from stand Toileting - Clothing Manipulation Details (indicate cue type and reason): Pt able to stand and hold bed rail allowing OT to provide total assist for peri hygiene. Pt asked for total assist and stated this is typical for her.  UE Dressing: Setup for overhead shirt.    Functional mobility during ADLs: Minimal assistance;Moderate assistance General ADL Comments: significantly increased time for all tasks; redirection to task/topic as needed    Extremity/Trunk Assessment              Vision Baseline Vision/History: 1 Wears glasses Ability to See in Adequate Light: 2 Moderately impaired Patient Visual Report: No change from baseline     Perception     Praxis      Cognition Arousal/Alertness: Awake/alert Behavior During Therapy: WFL for tasks assessed/performed Overall Cognitive Status: Within Functional Limits for tasks assessed                                 General Comments: A&Ox4. Talkative and needs redirection.  No c/o dizziness or vertigo this session. Pt seems calmer with daughter in room.          Exercises     Shoulder Instructions       General Comments      Pertinent Vitals/ Pain       Pain Score: 0-No pain  Home Living                                          Prior Functioning/Environment              Frequency  Min 2X/week        Progress Toward Goals  OT Goals(current goals can now be found in the care plan section)  Progress towards OT goals: Progressing toward goals  Acute Rehab OT Goals OT Goal Formulation: With patient Time For Goal Achievement: 01/28/21 Potential to Achieve Goals: Good  Plan Discharge plan remains appropriate    Co-evaluation                 AM-PAC OT "6 Clicks" Daily Activity     Outcome  Measure   Help from another person eating meals?: None Help from another person taking care of personal grooming?: A Little Help from another person toileting, which includes using toliet, bedpan, or urinal?: Total Help from another person bathing (including washing, rinsing, drying)?: A Lot Help from another person to put on and taking off regular upper body clothing?: A Little Help from another person to put on and taking off regular lower body clothing?: A Lot 6 Click Score: 15    End of Session Equipment Utilized During Treatment: Gait belt  OT Visit Diagnosis: Unsteadiness on feet (R26.81);Muscle  weakness (generalized) (M62.81);Other abnormalities of gait and mobility (R26.89)   Activity Tolerance Patient tolerated treatment well   Patient Left in bed;with call bell/phone within reach;with bed alarm set;with family/visitor present   Nurse Communication Mobility status        Time: 5258-9483 OT Time Calculation (min): 25 min  Charges: OT General Charges $OT Visit: 1 Visit OT Treatments $Self Care/Home Management : 8-22 mins $Therapeutic Activity: 8-22 mins  Anderson [place], [place] Office: [phone] 01/23/2021  Julien Girt 01/23/2021, 1:06 PM

## 2021-01-25 DIAGNOSIS — Z515 Encounter for palliative care: Secondary | ICD-10-CM | POA: Diagnosis not present

## 2021-01-25 DIAGNOSIS — R41841 Cognitive communication deficit: Secondary | ICD-10-CM | POA: Diagnosis not present

## 2021-01-25 DIAGNOSIS — N281 Cyst of kidney, acquired: Secondary | ICD-10-CM | POA: Diagnosis not present

## 2021-01-25 DIAGNOSIS — Z20822 Contact with and (suspected) exposure to covid-19: Secondary | ICD-10-CM | POA: Diagnosis present

## 2021-01-25 DIAGNOSIS — D62 Acute posthemorrhagic anemia: Secondary | ICD-10-CM | POA: Diagnosis present

## 2021-01-25 DIAGNOSIS — K921 Melena: Secondary | ICD-10-CM | POA: Diagnosis present

## 2021-01-25 DIAGNOSIS — M6281 Muscle weakness (generalized): Secondary | ICD-10-CM | POA: Diagnosis not present

## 2021-01-25 DIAGNOSIS — G40909 Epilepsy, unspecified, not intractable, without status epilepticus: Secondary | ICD-10-CM | POA: Diagnosis present

## 2021-01-25 DIAGNOSIS — K7460 Unspecified cirrhosis of liver: Secondary | ICD-10-CM | POA: Diagnosis present

## 2021-01-25 DIAGNOSIS — R188 Other ascites: Secondary | ICD-10-CM | POA: Diagnosis present

## 2021-01-25 DIAGNOSIS — R609 Edema, unspecified: Secondary | ICD-10-CM | POA: Diagnosis not present

## 2021-01-25 DIAGNOSIS — E871 Hypo-osmolality and hyponatremia: Secondary | ICD-10-CM | POA: Diagnosis present

## 2021-01-25 DIAGNOSIS — Z743 Need for continuous supervision: Secondary | ICD-10-CM | POA: Diagnosis not present

## 2021-01-25 DIAGNOSIS — G8929 Other chronic pain: Secondary | ICD-10-CM | POA: Diagnosis not present

## 2021-01-25 DIAGNOSIS — M797 Fibromyalgia: Secondary | ICD-10-CM | POA: Diagnosis present

## 2021-01-25 DIAGNOSIS — D689 Coagulation defect, unspecified: Secondary | ICD-10-CM | POA: Diagnosis not present

## 2021-01-25 DIAGNOSIS — Z66 Do not resuscitate: Secondary | ICD-10-CM | POA: Diagnosis present

## 2021-01-25 DIAGNOSIS — G9341 Metabolic encephalopathy: Secondary | ICD-10-CM | POA: Diagnosis not present

## 2021-01-25 DIAGNOSIS — R52 Pain, unspecified: Secondary | ICD-10-CM | POA: Diagnosis not present

## 2021-01-25 DIAGNOSIS — I10 Essential (primary) hypertension: Secondary | ICD-10-CM | POA: Diagnosis not present

## 2021-01-25 DIAGNOSIS — I8510 Secondary esophageal varices without bleeding: Secondary | ICD-10-CM | POA: Diagnosis present

## 2021-01-25 DIAGNOSIS — N184 Chronic kidney disease, stage 4 (severe): Secondary | ICD-10-CM | POA: Diagnosis present

## 2021-01-25 DIAGNOSIS — J9811 Atelectasis: Secondary | ICD-10-CM | POA: Diagnosis not present

## 2021-01-25 DIAGNOSIS — R339 Retention of urine, unspecified: Secondary | ICD-10-CM | POA: Diagnosis not present

## 2021-01-25 DIAGNOSIS — Z7409 Other reduced mobility: Secondary | ICD-10-CM | POA: Diagnosis not present

## 2021-01-25 DIAGNOSIS — D684 Acquired coagulation factor deficiency: Secondary | ICD-10-CM | POA: Diagnosis present

## 2021-01-25 DIAGNOSIS — R1012 Left upper quadrant pain: Secondary | ICD-10-CM | POA: Diagnosis not present

## 2021-01-25 DIAGNOSIS — D649 Anemia, unspecified: Secondary | ICD-10-CM | POA: Diagnosis not present

## 2021-01-25 DIAGNOSIS — R1900 Intra-abdominal and pelvic swelling, mass and lump, unspecified site: Secondary | ICD-10-CM | POA: Diagnosis not present

## 2021-01-25 DIAGNOSIS — I2510 Atherosclerotic heart disease of native coronary artery without angina pectoris: Secondary | ICD-10-CM | POA: Diagnosis present

## 2021-01-25 DIAGNOSIS — F319 Bipolar disorder, unspecified: Secondary | ICD-10-CM | POA: Diagnosis present

## 2021-01-25 DIAGNOSIS — I8500 Esophageal varices without bleeding: Secondary | ICD-10-CM | POA: Diagnosis not present

## 2021-01-25 DIAGNOSIS — Z950 Presence of cardiac pacemaker: Secondary | ICD-10-CM | POA: Diagnosis not present

## 2021-01-25 DIAGNOSIS — K766 Portal hypertension: Secondary | ICD-10-CM | POA: Diagnosis present

## 2021-01-25 DIAGNOSIS — K922 Gastrointestinal hemorrhage, unspecified: Secondary | ICD-10-CM | POA: Diagnosis not present

## 2021-01-25 DIAGNOSIS — N179 Acute kidney failure, unspecified: Secondary | ICD-10-CM | POA: Diagnosis not present

## 2021-01-25 DIAGNOSIS — K8689 Other specified diseases of pancreas: Secondary | ICD-10-CM | POA: Diagnosis not present

## 2021-01-25 DIAGNOSIS — E039 Hypothyroidism, unspecified: Secondary | ICD-10-CM | POA: Diagnosis present

## 2021-01-25 DIAGNOSIS — K754 Autoimmune hepatitis: Secondary | ICD-10-CM | POA: Diagnosis present

## 2021-01-25 DIAGNOSIS — I495 Sick sinus syndrome: Secondary | ICD-10-CM | POA: Diagnosis present

## 2021-01-25 DIAGNOSIS — R2681 Unsteadiness on feet: Secondary | ICD-10-CM | POA: Diagnosis not present

## 2021-01-25 DIAGNOSIS — R262 Difficulty in walking, not elsewhere classified: Secondary | ICD-10-CM | POA: Diagnosis not present

## 2021-01-25 DIAGNOSIS — R600 Localized edema: Secondary | ICD-10-CM | POA: Diagnosis not present

## 2021-01-25 DIAGNOSIS — I129 Hypertensive chronic kidney disease with stage 1 through stage 4 chronic kidney disease, or unspecified chronic kidney disease: Secondary | ICD-10-CM | POA: Diagnosis present

## 2021-01-25 DIAGNOSIS — D61818 Other pancytopenia: Secondary | ICD-10-CM | POA: Diagnosis present

## 2021-01-25 DIAGNOSIS — I519 Heart disease, unspecified: Secondary | ICD-10-CM | POA: Diagnosis not present

## 2021-01-25 DIAGNOSIS — K7469 Other cirrhosis of liver: Secondary | ICD-10-CM | POA: Diagnosis not present

## 2021-01-25 DIAGNOSIS — F419 Anxiety disorder, unspecified: Secondary | ICD-10-CM | POA: Diagnosis present

## 2021-01-25 DIAGNOSIS — F200 Paranoid schizophrenia: Secondary | ICD-10-CM | POA: Diagnosis present

## 2021-01-25 DIAGNOSIS — R0602 Shortness of breath: Secondary | ICD-10-CM | POA: Diagnosis not present

## 2021-01-25 DIAGNOSIS — K7290 Hepatic failure, unspecified without coma: Secondary | ICD-10-CM | POA: Diagnosis not present

## 2021-01-25 DIAGNOSIS — N62 Hypertrophy of breast: Secondary | ICD-10-CM | POA: Diagnosis not present

## 2021-01-27 ENCOUNTER — Telehealth: Payer: Self-pay | Admitting: Family Medicine

## 2021-01-27 DIAGNOSIS — D61818 Other pancytopenia: Secondary | ICD-10-CM | POA: Diagnosis not present

## 2021-01-27 DIAGNOSIS — K754 Autoimmune hepatitis: Secondary | ICD-10-CM | POA: Diagnosis not present

## 2021-01-27 DIAGNOSIS — K922 Gastrointestinal hemorrhage, unspecified: Secondary | ICD-10-CM | POA: Diagnosis not present

## 2021-01-27 DIAGNOSIS — D649 Anemia, unspecified: Secondary | ICD-10-CM | POA: Diagnosis not present

## 2021-01-27 DIAGNOSIS — I8500 Esophageal varices without bleeding: Secondary | ICD-10-CM | POA: Diagnosis not present

## 2021-01-27 DIAGNOSIS — E871 Hypo-osmolality and hyponatremia: Secondary | ICD-10-CM | POA: Diagnosis not present

## 2021-01-27 DIAGNOSIS — R339 Retention of urine, unspecified: Secondary | ICD-10-CM | POA: Diagnosis not present

## 2021-01-27 DIAGNOSIS — N179 Acute kidney failure, unspecified: Secondary | ICD-10-CM | POA: Diagnosis not present

## 2021-01-27 DIAGNOSIS — E039 Hypothyroidism, unspecified: Secondary | ICD-10-CM | POA: Diagnosis not present

## 2021-01-27 DIAGNOSIS — K7460 Unspecified cirrhosis of liver: Secondary | ICD-10-CM | POA: Diagnosis not present

## 2021-01-27 DIAGNOSIS — G9341 Metabolic encephalopathy: Secondary | ICD-10-CM | POA: Diagnosis not present

## 2021-01-27 DIAGNOSIS — R188 Other ascites: Secondary | ICD-10-CM | POA: Diagnosis not present

## 2021-01-27 NOTE — Telephone Encounter (Signed)
I attempted to leave message for patient to call back and schedule Medicare Annual Wellness Visit (AWV) in office. No answer.  If not able to come in office, please offer to do virtually or by telephone.  Left office number and my jabber [phone].  Last AWV:08/01/2019  Please schedule at anytime with Nurse Health Advisor.

## 2021-01-28 ENCOUNTER — Non-Acute Institutional Stay: Payer: Medicare Other | Admitting: Family Medicine

## 2021-01-28 VITALS — BP 120/76 | HR 68 | Temp 97.6°F | Resp 18 | Wt 194.0 lb

## 2021-01-28 DIAGNOSIS — N179 Acute kidney failure, unspecified: Secondary | ICD-10-CM | POA: Diagnosis not present

## 2021-01-28 DIAGNOSIS — D689 Coagulation defect, unspecified: Secondary | ICD-10-CM

## 2021-01-28 DIAGNOSIS — K7460 Unspecified cirrhosis of liver: Secondary | ICD-10-CM | POA: Diagnosis not present

## 2021-01-28 DIAGNOSIS — R188 Other ascites: Secondary | ICD-10-CM | POA: Diagnosis not present

## 2021-01-28 DIAGNOSIS — K7290 Hepatic failure, unspecified without coma: Secondary | ICD-10-CM

## 2021-01-28 DIAGNOSIS — Z66 Do not resuscitate: Secondary | ICD-10-CM | POA: Diagnosis not present

## 2021-01-28 DIAGNOSIS — Z7409 Other reduced mobility: Secondary | ICD-10-CM | POA: Diagnosis not present

## 2021-01-28 DIAGNOSIS — K922 Gastrointestinal hemorrhage, unspecified: Secondary | ICD-10-CM | POA: Diagnosis not present

## 2021-01-28 NOTE — Progress Notes (Signed)
[facility] Consult Note Telephone: [phone]  Fax: [phone]   Date of encounter: 01/28/21 3:45 PM PATIENT NAME: Stacey Velazquez [address] [place] [address]   [phone] (home)  DOB: 04-08-1945 MRN: [id number] PRIMARY CARE PROVIDER:    Libby Maw, MD,  [place] [address] [phone]  REFERRING PROVIDER:   Libby Velazquez, [name],  [place] [address] [phone]  RESPONSIBLE PARTY:    Contact Information     Name Relation Home Work [name] Daughter [phone]  [phone]   [name] [phone]  [phone]   Stacey Velazquez Daughter [phone]  [phone]        I met face to face with patient and daughter Stacey Velazquez in [place] facility. Palliative Care was asked to follow this patient by consultation request of  Stacey Velazquez,* to address advance care planning and complex medical decision making. This is the initial visit.                                     ASSESSMENT, SYMPTOM MANAGEMENT AND PLAN / RECOMMENDATIONS:  Decompensated autoimmune hepatitis-monitor for difficulty arousing pt, confusion, reversal of sleep-wake cycle. Last ammonia inpatient was elevated in 50s.  Avoid hepatotoxic substances.  Use laxative daily to encourage BM.  Follow up with GI as scheduled.  MELD Na score 27. 2.  Ascites of liver disease-consider use of lowest dose Fentanyl patch with slow titration to manage abdominal pain (after discussion with supervising provider). Hesitant to use Dilaudid due to "heart stops" with morphine and dilaudid eventually breaks down to morphine derivative.  Avoid NSAIDs with renal disease. Monitor for additional decompensation if fluid increases including SOB, vomiting, inability to eat. Encourage intake of protein shakes. 3. Coagulopathy-monitor due to risk for infection and bleeding.  Had  recent vitamin K given, PLT and 1 unit of PRBCs.  Avoid NSAIDs. 4. AKI-Avoid nephrotoxic substances, encourage fluid intake to daily limit. 5.  Limited mobility-install trapeze for bed to aid pt's independent mobility and help her reposition with less pain and independently offload pressure points.  Bedside commode to decrease fall risk, increase independence.  Continue PT and OT with facility.  Advance Care Planning/Goals of Care: Goals include to maximize quality of life and symptom management. Patient gave her permission to discuss.Our advance care planning conversation included a discussion about:    The value and importance of advance care planning  Exploration of personal, cultural or spiritual beliefs that might influence medical decisions -strong faith in God to take care of her and her needs Exploration of goals of care in the event of a sudden injury or illness-considering her options and currently wants full intervention  CODE STATUS: Full Code      Follow up Palliative Care Visit: Palliative care will continue to follow for complex medical decision making, advance care planning, and clarification of goals. Return 4 weeks or prn.  I spent 95 minutes providing this consultation. More than 50% of the time in this consultation was spent in counseling and care coordination.  This visit was coded based on medical decision making (MDM).  PPS: 60%  HOSPICE ELIGIBILITY/DIAGNOSIS: TBD  Chief Complaint:  Pt with cirrhosis, ascites who has pain issues and has been requiring paracentesis.  She was recently inpatient and on one occasion on 01/12/21 she had 3.5 liters  and on 2nd date of 01/15/21 had an additional 3 liter paracentesis done.  HISTORY OF PRESENT ILLNESS:  Stacey Velazquez is a 76 y.o. year old female with autoimmune hepatitis and cirrhosis with pancytopenia with severe thrombocytopenia.  While inpatient pt received 1 unit of blood and 1 unit of PLT with PLT 44.  Pt c/o pain in  abdomen particularly when moving in bed and difficulty repositioning.  She is able to dress herself with some assistance, toilet without difficulty, has had no recent falls and is due to follow up again with GI on Monday.  She is unclear if GI will repeat paracentesis at that time or if she will be scheduled for it shortly thereafter.  Since there are no bedrails on her current bed she has difficulty repositioning and relieving pressure on her buttocks and has noted some skin breakdown.  She has an unsteady gait on standing and requires assistance of walker but also has benefited from having bedside commode to use.  Daughter Stacey Velazquez is present and states mother needs supervision when getting up to the bathroom for stability.  Pt has had some urge incontinence as she cannot move fast enough to get to the bathroom.  She was admitted to [facility] to undergo PT and OT for strengthening.  Pt is not a candidate for liver transplant.  She has a very strong faith in God that she says will help her face anything she might endure.   Daughter is very concerned about pt being able to be mobile and having pain control but has multiple narcotic allergies, has kidney disease which negates her being able to use NSAIDs for pain relief.  She states she was seen by Inpatient Palliative Care who recommended use of either Fentanyl patch or Dilaudid as options to manage pain. Daughter agrees that she will follow her mother's lead for care as long she is able because pt is very independent but if she is unable to make a decision then she and her sister Stacey Velazquez will follow the patient's previously expressed desires.  Stacey Velazquez's biggest concerns are her mother's pain management and maintaining as much independence as possible to try and get stronger.  Advised both that given her allergy profile, selection of a safe route for pain management will require careful study and discussion with supervising physician.  As a result of her ascites she  has some early satiety and inability to eat much, has been supplementing with protein shakes. Per notes in Epic pt did see Palliative when inpatient but there are no specific recommendations on pain management.  Records indicate that she has had IV fentanyl previously and Dilaudid has been prescribed. She indicates there have been times when she has been unable to urinate for 2-3 days at a time.  Facility nurse indicated that neither patient nor her daughter mentioned this to her but had indicated that pt was having more pain. Pt reported that Flomax taken daily did help with urination issues but she has not been on it since admitting to [facility].  Did notice orders for Flomax in pt's orders.     History obtained from review of EMR, discussion with daughter, facility staff/caregiver and Ms. Duignan.  I reviewed available labs, medications, imaging, studies and related documents from the EMR.  Records reviewed and summarized above.   ROS General: NAD EYES: denies vision changes ENMT: denies dysphagia Cardiovascular: denies chest pain, some DOE Pulmonary: denies cough, endorses increased SOB as abdominal fluid accumulates more Abdomen: endorses fair  appetite, denies constipation, endorses continence of bowel for most part GU: denies dysuria, endorses continence of urine for most part unless she can't get help to bathroom when having to go and recent difficulty with initiating urination or complete emptying MSK:  generalized weakness noted, no falls reported Skin: denies rashes or wounds Neurological: denies insomnia but endorses some increased fatigue particularly with minimal exertion Psych: Endorses positive mood that she says relies on her faith in GOD Heme/lymph/immuno: denies bruises, abnormal bleeding  Physical Exam: Current and past weights: 194 as of 01/28/21 Constitutional: NAD.  Appears fatigued General: frail appearing with distended abdomen, appears as if about 8 months  pregnant EYES: anicteric sclera, lids intact, no discharge  ENMT: intact hearing, oral mucous membranes moist, dentition intact CV: S1S2, RRR, Bilat 1+ LE edema Pulmonary: CTAB, no increased work of breathing, no cough, room air Abdomen:  normo-active BS + 4 quadrants, soft and moderately generally tender, noted ascites with fluid wave GU: deferred MSK: no sarcopenia, moves all extremities, ambulatory with assistance and walker Skin: warm and dry,no rashes or wounds on visible skin Neuro:  noted generalized weakness,  no cognitive impairment Psych: non-anxious affect, A and O x 3 Velazquez/lymph/immuno: no widespread bruising  CURRENT PROBLEM LIST:  Patient Active Problem List   Diagnosis Date Noted   Coagulopathy due to cirrhosis 01/20/2021   Acute on chronic blood loss anemia 01/18/2021   AKI (acute kidney injury) ([place]) 68/08/8108   Acute metabolic encephalopathy 31/59/4585   Neutropenia, drug-induced (HCC)    Anemia associated with chemotherapy 01/14/2021   Ascites of liver 01/12/2021   Chronic blood loss anemia 01/12/2021   Ceruminosis, left 12/16/2020   Hyponatremia 11/04/2020   Need for influenza vaccination 11/04/2020   Otitis externa 09/11/2020   Bilateral hearing loss 09/11/2020   Portal hypertensive gastropathy ([place]) 04/21/2020   Acute esophagitis    Basal cell carcinoma of scalp 03/24/2020   Onychomycosis 03/24/2020   Pedal edema 03/24/2020   Esophageal varices determined by endoscopy ([place]) 07/03/2019   Limited mobility 05/14/2019   Adenomatous polyp of rectum s/p TEM partial proctectomy 05/09/2019 05/10/2019   Decompensated hepatic cirrhosis ([place]) 04/09/2019   Obesity, Class II, BMI 35-39.9 04/09/2019   Hallucinations 03/13/2019   Rectal bleeding 03/05/2019   Vertigo    Heart disease    GERD (gastroesophageal reflux disease)    Autoimmune hepatitis ([place])    Anxiety    Low back pain 10/18/2016   Acute right hip pain 08/26/2016   Seizure disorder ([place]) 10/14/2014    Drug-induced Parkinson's disease ([place]) 10/14/2014   Hypothyroidism 09/09/2014   Pancytopenia ([place]) 08/25/2014   Thrombocytopenia due to drugs 08/25/2014   Syncope    Sinus arrest    Pacemaker Medtronic    Bipolar 1 disorder ([place])    Paranoid schizophrenia ([place]) 03/25/2012   Glaucoma 01/25/2001   PAST MEDICAL HISTORY:  Active Ambulatory Problems    Diagnosis Date Noted   Bipolar 1 disorder ([place])    Sinus arrest    Pacemaker Medtronic    Syncope    Pancytopenia ([place]) 08/25/2014   Thrombocytopenia due to drugs 08/25/2014   Hypothyroidism 09/09/2014   Seizure disorder ([place]) 10/14/2014   Drug-induced Parkinson's disease ([place]) 10/14/2014   Acute right hip pain 08/26/2016   Low back pain 10/18/2016   Vertigo    Heart disease    Glaucoma 01/25/2001   GERD (gastroesophageal reflux disease)    Autoimmune hepatitis ([place])    Anxiety    Rectal bleeding 03/05/2019  Hallucinations 03/13/2019   Paranoid schizophrenia ([place]) 03/25/2012   Decompensated hepatic cirrhosis ([place]) 04/09/2019   Obesity, Class II, BMI 35-39.9 04/09/2019   Adenomatous polyp of rectum s/p TEM partial proctectomy 05/09/2019 05/10/2019   Limited mobility 05/14/2019   Esophageal varices determined by endoscopy ([place]) 07/03/2019   Basal cell carcinoma of scalp 03/24/2020   Onychomycosis 03/24/2020   Pedal edema 03/24/2020   Acute esophagitis    Portal hypertensive gastropathy ([place]) 04/21/2020   Otitis externa 09/11/2020   Bilateral hearing loss 09/11/2020   Hyponatremia 11/04/2020   Need for influenza vaccination 11/04/2020   Ceruminosis, left 12/16/2020   Ascites of liver 01/12/2021   Chronic blood loss anemia 01/12/2021   Anemia associated with chemotherapy 01/14/2021   Neutropenia, drug-induced ([place])    AKI (acute kidney injury) ([place]) 95/74/7340   Acute metabolic encephalopathy 37/09/6436   Acute on chronic blood loss anemia 01/18/2021   Coagulopathy due to cirrhosis 01/20/2021   Resolved Ambulatory  Problems    Diagnosis Date Noted   Hypotension    Acute encephalopathy 08/21/2014   Arterial hypotension    Seizures (HCC)    Hypokalemia    Hepatitis, autoimmune ([place]) 08/25/2014   Need for hepatitis C screening test 09/09/2014   Gingival abscess 08/26/2016   Hypotension    Gastric ulcer    Rectal polyp    Adenomatous polyp of ascending colon    Adenomatous rectal polyp 05/10/2019   Abdominal ascites 01/13/2021   Past Medical History:  Diagnosis Date   Bilateral lower extremity edema    Carpal tunnel syndrome    Cirrhosis of liver (HCC)    CKD (chronic kidney disease) stage 4, GFR 15-29 ml/min ([place]) 11/04/2020   Cutaneous horn    Drug-induced parkinsonism (HCC)    Esophageal varices (HCC)    Fibromyalgia    Heart attack ([place])    History of iron deficiency anemia    Migraines    Neutropenia (HCC)    Osteoporosis    Polyarthritis    Polyosteoarthritis    Rectal mass    Skin cancer    SSS (sick sinus syndrome) ([place])    Stroke ([place])    Thrombocytopenia ([place])    SOCIAL HX:  Social History   Tobacco Use   Smoking status: Former    Types: Cigarettes    Quit date: 03/02/2003    Years since quitting: 17.9   Smokeless tobacco: Never  Substance Use Topics   Alcohol use: No   FAMILY HX:  Family History  Problem Relation Age of Onset   Heart attack Father    Heart disease Paternal Uncle    Heart disease Paternal Grandmother    Diabetes Paternal Grandmother    Heart disease Paternal Grandfather    Breast cancer Mother    Cancer Maternal Grandmother    CVA Maternal Grandmother    Cancer Maternal Grandfather    CVA Maternal Grandfather    Diabetes Sister    Diabetes Brother    Diabetes Brother    Stroke Daughter       ALLERGIES:  Allergies  Allergen Reactions   Misc. Throat Products Other (See Comments)    PT states that she is allergic to OTC  Flu, cold, sinus, allergy, gas, and GERD meds   Neomycin Nausea And Vomiting   Oxycodone-Acetaminophen Rash    Ambien [Zolpidem Tartrate] Other (See Comments)    Hallucinations    Amitriptyline Other (See Comments)    Stops heart per pt   Ampicillin Other (See Comments)  Stops heart per pt    Anaprox [Naproxen Sodium] Other (See Comments)    Doesn't work per pt   Benadryl [Diphenhydramine Hcl (Sleep)] Other (See Comments)    Rash   Cetirizine & Related Other (See Comments)    Swelling    Cortizone-10 [Hydrocortisone] Other (See Comments)    seizures    Darvon [Propoxyphene] Other (See Comments)    Increased heart rate per pt    Diazepam Other (See Comments)    Stops heart per pt   Diflunisal Swelling and Other (See Comments)    Stops heart per pt (Dolobid)   Duloxetine Other (See Comments)    Reaction to Cymbalta - pt doesn't remember what the reaction was   Flexeril [Cyclobenzaprine] Other (See Comments)    Seizures    Lactose Intolerance (Gi) Swelling and Other (See Comments)    cramping   Lidocaine Other (See Comments)    Seizures   Meperidine And Related Other (See Comments)    Stops heart rate (reaction to Demerol)   Metanx [L-Methylfolate-Algae-B12-B6] Other (See Comments)    Pt does not remember reaction   Metoclopramide Other (See Comments)    Pt does not remember reaction   Morphine And Related Other (See Comments)    Stops heart per pt    Nuprin [Ibuprofen] Other (See Comments)    Caused headache   Oxycodone Other (See Comments)    Hallucinations    Penicillins Other (See Comments)    Stops heart per pt    Propoxyphene Other (See Comments)    Slowed heart rate per pt (reaction to Darvocet)   Ranitidine Other (See Comments)    Pt does not remember reaction   Vicodin [Hydrocodone-Acetaminophen] Other (See Comments)    Seizures    Butenafine Hcl Rash   Tramadol Other (See Comments)   Tylenol [Acetaminophen] Rash   Zolpidem Other (See Comments) and Rash    Hallucinations      PERTINENT MEDICATIONS:  Outpatient Encounter Medications as of 01/28/2021   Medication Sig   famotidine (PEPCID) 20 MG tablet Take 1 tablet (20 mg total) by mouth 2 (two) times daily.   levothyroxine (SYNTHROID) 50 MCG tablet Take 1 tablet (50 mcg total) by mouth daily.   midodrine (PROAMATINE) 5 MG tablet Take 1 tablet (5 mg total) by mouth 2 (two) times daily with a meal.   Nutritional Supplements (,FEEDING SUPPLEMENT, PROSOURCE PLUS) liquid Take 30 mLs by mouth 3 (three) times daily between meals for 7 days.   pantoprazole (PROTONIX) 40 MG tablet Take 1 tablet (40 mg total) by mouth 2 (two) times daily for 20 days.   polyethylene glycol (MIRALAX / GLYCOLAX) 17 g packet Take 8.5 g by mouth daily as needed for severe constipation.   predniSONE (DELTASONE) 20 MG tablet Take 1 tablet (20 mg total) by mouth daily with breakfast.   Probiotic Product (DIGESTIVE ADV DIGESTIVE/IMMUNE) CHEW Chew 2 capsules by mouth at bedtime.   Skin Protectants, Misc. (DRY SKIN EX) Apply 1 application topically daily. Apply to both elbows, knee caps, and feet   tamsulosin (FLOMAX) 0.4 MG CAPS capsule Take 0.4 mg by mouth at bedtime.   No facility-administered encounter medications on file as of 01/28/2021.   Thank you for the opportunity to participate in the care of Ms. Reggio.  The palliative care team will continue to follow. Please call our office at [phone] if we can be of additional assistance.   Marijo Conception, FNP-C  COVID-19 PATIENT SCREENING TOOL Asked and  negative response unless otherwise noted:  Have you had symptoms of covid, tested positive or been in contact with someone with symptoms/positive test in the past 5-10 days? No

## 2021-01-28 NOTE — Telephone Encounter (Signed)
Thank you :)

## 2021-01-28 NOTE — Telephone Encounter (Signed)
Called [facility] @ [phone] to schedule hosp f/u appt w/ Roosevelt Locks, CRNP. LVM requesting returned call.

## 2021-01-28 NOTE — Telephone Encounter (Signed)
Received returned call from [facility]. Pt scheduled to be seen by Roosevelt Locks, CRNP on 02/04/21 @ 930am. Called transportation at [employer] and informed about appt info. Also routing to Dr. Tarri Glenn for continuity of care purposes.

## 2021-01-29 ENCOUNTER — Encounter: Payer: Self-pay | Admitting: Family Medicine

## 2021-01-29 ENCOUNTER — Other Ambulatory Visit: Payer: Self-pay

## 2021-01-30 ENCOUNTER — Encounter: Payer: Self-pay | Admitting: Family Medicine

## 2021-02-02 ENCOUNTER — Other Ambulatory Visit (INDEPENDENT_AMBULATORY_CARE_PROVIDER_SITE_OTHER): Payer: Medicare Other

## 2021-02-02 ENCOUNTER — Encounter: Payer: Self-pay | Admitting: Gastroenterology

## 2021-02-02 ENCOUNTER — Ambulatory Visit (INDEPENDENT_AMBULATORY_CARE_PROVIDER_SITE_OTHER): Payer: Medicare Other | Admitting: Gastroenterology

## 2021-02-02 VITALS — BP 110/70 | HR 80

## 2021-02-02 DIAGNOSIS — K7469 Other cirrhosis of liver: Secondary | ICD-10-CM

## 2021-02-02 DIAGNOSIS — K754 Autoimmune hepatitis: Secondary | ICD-10-CM

## 2021-02-02 DIAGNOSIS — D61818 Other pancytopenia: Secondary | ICD-10-CM

## 2021-02-02 DIAGNOSIS — R1012 Left upper quadrant pain: Secondary | ICD-10-CM

## 2021-02-02 LAB — COMPREHENSIVE METABOLIC PANEL
ALT: 43 U/L — ABNORMAL HIGH (ref 0–35)
AST: 40 U/L — ABNORMAL HIGH (ref 0–37)
Albumin: 3 g/dL — ABNORMAL LOW (ref 3.5–5.2)
Alkaline Phosphatase: 101 U/L (ref 39–117)
BUN: 27 mg/dL — ABNORMAL HIGH (ref 6–23)
CO2: 25 mEq/L (ref 19–32)
Calcium: 8.2 mg/dL — ABNORMAL LOW (ref 8.4–10.5)
Chloride: 104 mEq/L (ref 96–112)
Creatinine, Ser: 1.13 mg/dL (ref 0.40–1.20)
GFR: 47.22 mL/min — ABNORMAL LOW (ref >60.00)
Glucose, Bld: 85 mg/dL (ref 70–99)
Potassium: 3.9 mEq/L (ref 3.5–5.1)
Sodium: 133 mEq/L — ABNORMAL LOW (ref 135–145)
Total Bilirubin: 1.2 mg/dL (ref 0.2–1.2)
Total Protein: 6.1 g/dL (ref 6.0–8.3)

## 2021-02-02 LAB — PROTIME-INR
INR: 1.4 ratio — ABNORMAL HIGH (ref 0.8–1.0)
Prothrombin Time: 15.3 s — ABNORMAL HIGH (ref 9.6–13.1)

## 2021-02-02 LAB — CBC WITH DIFFERENTIAL/PLATELET
Basophils Absolute: 0 10*3/uL (ref 0.0–0.1)
Basophils Relative: 0.5 % (ref 0.0–3.0)
Eosinophils Absolute: 0 10*3/uL (ref 0.0–0.7)
Eosinophils Relative: 1.3 % (ref 0.0–5.0)
HCT: 33.6 % — ABNORMAL LOW (ref 36.0–46.0)
Hemoglobin: 11.2 g/dL — ABNORMAL LOW (ref 12.0–15.0)
Lymphocytes Relative: 21.7 % (ref 12.0–46.0)
Lymphs Abs: 0.6 10*3/uL — ABNORMAL LOW (ref 0.7–4.0)
MCHC: 33.3 g/dL (ref 30.0–36.0)
MCV: 92 fl (ref 78.0–100.0)
Monocytes Absolute: 0.3 10*3/uL (ref 0.1–1.0)
Monocytes Relative: 10.7 % (ref 3.0–12.0)
Neutro Abs: 1.8 10*3/uL (ref 1.4–7.7)
Neutrophils Relative %: 65.8 % (ref 43.0–77.0)
Platelets: 76 10*3/uL — ABNORMAL LOW (ref 150.0–400.0)
RBC: 3.65 Mil/uL — ABNORMAL LOW (ref 3.87–5.11)
RDW: 19 % — ABNORMAL HIGH (ref 11.5–15.5)
WBC: 2.7 10*3/uL — ABNORMAL LOW (ref 4.0–10.5)

## 2021-02-02 NOTE — Patient Instructions (Signed)
Your provider has requested that you go to the basement level for lab work before leaving today. Press "B" on the elevator. The lab is located at the first door on the left as you exit the elevator.  

## 2021-02-02 NOTE — Progress Notes (Signed)
Referring Provider: Libby Velazquez,* Primary Care Physician:  Stacey Maw, MD  Chief complaint:  Autoimmune hepatitis   IMPRESSION:  Decompensated cirrhosis due to AIH Autoimmune hepatitis (AIH)     - Diagnosed in 2006    - Liver biopsy 2006: Grade 3, stage I autoimmune hepatitis    - Previously on azathioprine and steroids    - Last seen in the [facility] hepatology clinic 2014    - Most recently treated by Dr. Benson Velazquez, last in 2018    - IgG 3501 02/27/19, 3164 05/01/19, 2620 06/21/19    - resumed azathioprine 100 mg QD 02/2019    - switched to Prograf 2/21 due to increasing IgG and progressive pancytopenia    - Seen at [facility] [place] [facility] 5/21    - Prograf discontinued 12/22 due to progressive renal failure and concurrent pancytopenia Ascites and peripheral edema    - Diuretics on hold due to recent acute kidney insufficiency Diffuse abdominal pain of unclear etiology Pancreatic tail cystic lesion    - Stable 0.8cm tail lesion without high risk features, likely side branch IPMN    - follow-up MRI with and without contrast overall stable Constipation with sense of incomplete evacuation    - ? Pelvic floor dysfunction following proctectomy 2021 Gastric ulcer on EGD 02/2019    - H pylori negative on biopsies    - Resolved on EGD 04/15/20 Esophageal varices on EGD 02/10/16 Stacey Velazquez)    - grade 2 esophageal varices on EGD 02/2019    - No history of variceal bleeding History of advanced colon polyps    - Multiple colonoscopies with tubular adenomas and tubulovillous adenomas    - Colonoscopy 2018 at [facility]: 56mm polyp in the ascending and at the hepatic flexure    - History of 20+mm distal rectal polyp on colonoscopy 03/06/19, 3 TAs, 2 HPs    - 2.6 cm rectal tubulovillous adenoma s/p TEM partial proctectomy 05/10/19 Stacey Velazquez)    - Surveillance colonoscopy due 2024 Uses a walker at home, wheelchair when out of her apartment Transportation issues Rehab center resident  Autoimmune  hepatitis: Now intolerant to both azathioprine and Prograf.  On prednisone with improving IgG prior to hospital discharge.  She [facility] to consider alternative treatments.  Pancytopenia: Improving off Prograf.  Continue close monitoring and follow-up with hematology as planned.  Cirrhosis by labs and imaging: Now with evidence for decompensation.  The patient and her daughter understand that she is not a candidate for liver transplant given her prior evaluation at the [facility].  Continue screening for hepatocellular carcinoma with cross-sectional screening in 6 months. MRI recommended given her body habitus.   Esophageal varices: Non-selective beta-blocker therapy on hold given recent renal insufficiency. Reviewed indications and side effect profile.  Acute kidney injury: No follow-up labs following her recent hospitalization.  I am hoping that her renal function will improve now that she has been off  Ascites and edema: Continue 2000 mg sodium restricted diet.  Resume diuretics as renal function allows.  Consider weekly IV albumin and IV Lasix infusion if this can be arranged as an outpatient in [place].  She will also discuss treatment strategies with Stacey Velazquez at the [facility] clinic. Stable pancreatic cyst: Stable. Plan EUS with any changes.   Recent GI bleeding.  Source not identified on EGD or flexible sigmoidoscopy.  There is been no additional bleeding since discharge.  We will repeat CBC  today.  Constipation with altered bowel habits: Not an active concern today  I confirmed with Stacey Velazquez that she has advanced directives and desires a DNR status.  I believe it would be appropriate to prioritize palliative care at this time.  She and her daughter have insight into the severity of her liver disease.   PLAN: - CMP, CBC, PT/INR, serum IgG - Continue prednisone until seen in follow-up by [facility] - Abstain from all alcohol - Continue labs every 2 weeks including CMP and IgG - Avoid NSAIDs - Continue to hold carvedilol due to renal insufficiency - MRI for hepatocellular carcinoma screening due February 2023 - Follow-up with hematology, nephrology, and [facility] as planned - Surveillance Colonoscopy 04/2022 if clinically appropriate at that time - Follow-up in this office in 3-4 months, earlier as needed - Prioritize follow-up with outpatient palliative care  I spent over 50 minutes of time, including in depth chart review, independent review of results as outlined above, communicating results with the patient directly, face-to-face time with the patient, coordinating care, ordering studies and medications as appropriate, and documentation.      HPI: Stacey Velazquez is a 76 y.o. female who returns in follow-up for cirrhosis due to autoimmune hepatitis. She was switch to Prograf in 2021 due to progressive pancytopenia on azathioprine.  I referred her to [facility] for recommendations regarding long-term management of her AIH at that time. They recommended changing the timing of her Prograf dosing and recommended Prograf levels. She was last seen by them in 05/2020 and no further follow-up was arranged. Her last office visit with me was 10/28/20. At the time of that visit she was doing well, but, we were continuing to have challenges with pancytopenia and lack of response to treatment with persistently elevated serum IgG levels.   She returns today after her recent hospitalization with GI bleeding, acute kidney injury, and abdominal distension. Her daughter accompanies her to this appointment.   Review of records from her recent hospitalization reviewed.  Her meld at time of hospitalization was 27.  She had 2 paracenteses during that hospitalization removing 3 L and 3.5 L of ascites.  No SBP was seen.  For evaluation of her anemia with a hemoglobin at 7 down from a  baseline of 10 she received 1 unit of packed red blood cells.  An EGD showed varices and gastritis but no stigmata of recent bleeding.  Flexible sigmoidoscopy with stool showed no source.  No blood seen.  Her autoimmune hepatitis was treated with steroids.  Liver enzymes were improving.  Nephrology treated her with Motrin.  She was seen by palliative care during her hospitalization.  She was discharged to a rehab center.  She has been having difficulty navigating staffing shortages at the [facility].  Primary complaints today are ongoing leakage from the paracentesis site on the left abdominal wall, progressive lower extremity edema, and concerns about what happens when she is discharged from rehab.  Edema is worse as the day progresses.  Palliative Care nurse is involved in her care and she has follow-up 02/25/21. Started on a Fentanyl patch because of pain identical to that which led to her hospitalization last month.   She is following a sodium restricted diet.  Taking all medications as prescribed.  Denies any symptoms of encephalopathy.  Has had no further bleeding since discharge.    Endoscopic History: EGD 03/06/19: Grade II varices were found in the middle third  of the esophagus. Moderate portal hypertensive gastropathy. Gastritis. One non-bleeding cratered gastric ulcer with no stigmata of bleeding was found on the lesser curvature of the stomach. There was no H pylori.  Colonoscopy with Dr. Benson Velazquez 02/10/2016 showed 6 polyps.  4 polyps ranged from 3 to 6 mm were removed and the descending: And transverse colon.  There were 2 polyps ranging from 30 to 40 mm that were biopsied and tattooed in the ascending colon and transverse.  Pathology report showed tubulous villous adenoma of high-grade dysplasia.  The patient was referred to [facility].   Colonoscopy with Dr. Stephanie Acre at [facility] 05/26/16: "The perianal and digital rectal examinations were normal. A greater than 50 mm polyp was found in the proximal ascending  colon. The polyp was sessile. Preparations were made for mucosal resection. Saline with methylene blue was injected to raise the lesion. Snare mucosal resection was performed. Resection and retrieval were complete. Coagulation for tissue destruction using snare was successful. A 50 mm polyp was found in the hepatic flexure. The polyp was semi-pedunculated. Preparations were made for mucosal resection. Saline with methylene blue was injected to raise the lesion. Snare mucosal resection was performed. Resection and retrieval were complete. Coagulation for tissue destruction using snare was successful. The exam was otherwise without abnormality on direct and retroflexion  views." Pathology revealed Tubulovillous adenoma (multiple fragments) with multiple foci of high-grade dysplasia.  Surveillance colonoscopy recommended in 1 year.  Colonoscopy 03/06/19 showed a 20 mm polyp in the distal rectum adjacent to the dentate line. Biopsies were consistent with tubulovillous adenoma with high-grade grandular dysplasia. She also had three 1 to 4 mm tubular adenomas and two hyperplastic polyps removed at that time.  TEM partial proctectomy of bleeding rectal mass with Dr. Johney Velazquez.  Pathology revealed a 2.6 cm tubulovillous adenoma.  Margins were uninvolved by dysplasia. EGD 04/15/20 showed 4 columns of grade 2 varices, portal hypertensive gastropathy, gastritis.  Gastric erosion had resolved.  Recent abdominal imaging: - Abdominal ultrasound1/25/2017: changes of cirrhosis with a subtle nodularity to the liver contours.  No focal abnormality.  Prior cholecystectomy.  - Abdominal ultrasound 03/05/19: cirrhosis and splenomegaly. There was no ascites.  - MRI 09/01/19: cirrhosis without liver mass. Splenomegaly. No ascites. Collateral gastroesophageal and paraumbilical varices. 0.8 cm pancreatic tail cystic lesion - CT abdomen pelvis with contrast 11/15/2019 for lower left quadrant pain: Cirrhosis, splenomegaly, spontaneous  splenorenal shunt, sigmoid diverticulosis, aortic atherosclerosis - MRI 03/05/2020: 8 mm pancreatic cyst unchanged likely representing an IPMN, cirrhosis, benign renal cysts - MRI 09/08/20:  unchanged pancreatic tail cystic lesion, cirrhosis without HCC  - CT abd/pelvis without contrast 01/12/21: Marked ascites, cirrhosis, perisplenic collaterals, sigmoid diverticulosis - Doppler ultrasound 01/14/21: cirrhosis, no thrombus, splenomegaly and ascites - Paracentesis 12/1920 and 01/15/21   Past Medical History:  Diagnosis Date   Adenomatous polyp of ascending colon    Anxiety    Bilateral lower extremity edema    burning sensations   Bipolar 1 disorder (HCC)    Carpal tunnel syndrome    Cirrhosis of liver (HCC)    CKD (chronic kidney disease) stage 4, GFR 15-29 ml/min ([place]) 11/04/2020   Cutaneous horn    Drug-induced parkinsonism ([place])    patient unaware   Esophageal varices ([place])    Fibromyalgia    patient denies   Gastric ulcer    GERD (gastroesophageal reflux disease)    Gingival abscess 08/26/2016   Glaucoma 2003   pt unaware   Heart attack ([place])    Heart disease  Hepatitis, autoimmune ([place]) 08/25/2014   History of iron deficiency anemia    Hypotension    Hypothyroidism    Migraines    Neutropenia ([place])    Osteoporosis    Pacemaker Medtronic    MRI compatible   Paranoid schizophrenia ([place])    Polyarthritis    Polyosteoarthritis    Rectal mass    Seizures ([place])    last sz 08/21/14   Sinus arrest    Skin cancer    forehead   SSS (sick sinus syndrome) ([place])    Stroke ([place])    Thrombocytopenia ([place])    Vertigo    bvvp    Past Surgical History:  Procedure Laterality Date   BIOPSY  03/06/2019   Procedure: BIOPSY;  Surgeon: Thornton Park, MD;  Location: [facility];  Service: Gastroenterology;;  EGD and COLON   BIOPSY  04/15/2020   Procedure: BIOPSY;  Surgeon: Thornton Park, MD;  Location: [facility];  Service: Gastroenterology;;   BIOPSY  01/13/2021    Procedure: BIOPSY;  Surgeon: Sharyn Creamer, MD;  Location: [facility];  Service: Gastroenterology;;   CATARACT EXTRACTION Left 2008   pt unaware   CHOLECYSTECTOMY     COLONOSCOPY WITH PROPOFOL N/A 03/06/2019   Procedure: COLONOSCOPY WITH PROPOFOL;  Surgeon: Thornton Park, MD;  Location: [facility];  Service: Gastroenterology;  Laterality: N/A;   ESOPHAGOGASTRODUODENOSCOPY N/A 01/13/2021   Procedure: ESOPHAGOGASTRODUODENOSCOPY (EGD);  Surgeon: Sharyn Creamer, MD;  Location: [facility];  Service: Gastroenterology;  Laterality: N/A;   ESOPHAGOGASTRODUODENOSCOPY (EGD) WITH PROPOFOL N/A 03/06/2019   Procedure: ESOPHAGOGASTRODUODENOSCOPY (EGD) WITH PROPOFOL ;  Surgeon: Thornton Park, MD;  [facility]: [facility];  Service: Gastroenterology;  Laterality: N/A;   ESOPHAGOGASTRODUODENOSCOPY (EGD) WITH PROPOFOL N/A 04/15/2020   Procedure: ESOPHAGOGASTRODUODENOSCOPY (EGD) WITH PROPOFOL;  Surgeon: Thornton Park, MD;  Location: [facility];  Service: Gastroenterology;  Laterality: N/A;   FLEXIBLE SIGMOIDOSCOPY N/A 01/13/2021   Procedure: FLEXIBLE SIGMOIDOSCOPY;  Surgeon: Sharyn Creamer, MD;  Location: [facility];  Service: Gastroenterology;  Laterality: N/A;   KNEE SURGERY Left 1985   PACEMAKER INSERTION  2014   PARTIAL HYSTERECTOMY  1979   PARTIAL PROCTECTOMY BY TEM N/A 05/10/2019   Procedure: TEM PARTIAL PROCTECTOMY OF RECTAL MASS WITH EXCISION RIGHT PERINEAL SKIN MASS;  Surgeon: Michael Boston, MD;  Location: [facility];  Service: General;  Laterality: N/A;   POLYPECTOMY  03/06/2019   Procedure: POLYPECTOMY;  Surgeon: Thornton Park, MD;  Location: [facility];  Service: Gastroenterology;;   REFRACTIVE SURGERY     TUBAL LIGATION         Allergies as of 02/02/2021 - Review Complete 02/02/2021  Allergen Reaction Noted   Misc. throat products Other (See Comments) 09/05/2012   Neomycin Nausea And Vomiting 05/10/2019   Oxycodone-acetaminophen Rash 09/05/2012   Ambien [zolpidem tartrate]  Other (See Comments) 02/21/2013   Amitriptyline Other (See Comments) 02/21/2013   Ampicillin Other (See Comments) 02/21/2013   Anaprox [naproxen sodium] Other (See Comments) 02/21/2013   Benadryl [diphenhydramine hcl (sleep)] Other (See Comments) 02/21/2013   Cetirizine & related Other (See Comments) 02/21/2013   Cortizone-10 [hydrocortisone] Other (See Comments) 02/21/2013   Darvon [propoxyphene] Other (See Comments) 02/21/2013   Diazepam Other (See Comments) 02/21/2013   Diflunisal Swelling and Other (See Comments) 02/21/2013   Duloxetine Other (See Comments) 02/21/2013   Flexeril [cyclobenzaprine] Other (See Comments) 02/21/2013   Lactose intolerance (gi) Swelling and Other (See Comments) 08/23/2014   Lidocaine Other (See Comments) 02/21/2013   Meperidine and related Other (See Comments) 02/21/2013  Metanx [l-methylfolate-algae-b12-b6] Other (See Comments) 02/21/2013   Metoclopramide Other (See Comments) 02/21/2013   Morphine and related Other (See Comments) 02/21/2013   Nuprin [ibuprofen] Other (See Comments) 02/21/2013   Oxycodone Other (See Comments) 02/21/2013   Penicillins Other (See Comments) 02/21/2013   Propoxyphene Other (See Comments) 02/21/2013   Ranitidine Other (See Comments) 02/21/2013   Vicodin [hydrocodone-acetaminophen] Other (See Comments) 02/21/2013   Butenafine hcl Rash 01/12/2021   Tramadol Other (See Comments) 05/17/2017   Tylenol [acetaminophen] Rash 02/21/2013   Zolpidem Other (See Comments) and Rash 09/05/2012    Family History  Problem Relation Age of Onset   Heart attack Father    Heart disease Paternal Uncle    Heart disease Paternal Grandmother    Diabetes Paternal Grandmother    Heart disease Paternal Grandfather    Breast cancer Mother    Cancer Maternal Grandmother    CVA Maternal Grandmother    Cancer Maternal Grandfather    CVA Maternal Grandfather    Diabetes Sister    Diabetes Brother    Diabetes Brother    Stroke Daughter       Physical Exam: General:   Alert,  well-nourished, pleasant and cooperative in NAD. Sitting in a wheelchair.  Hard of hearing  Heart:  Regular rate and rhythm; no murmurs Pulm: Clear anteriorly; no wheezing Abdomen:  Soft. Central obesity. Nontender. Nondistended. Normal bowel sounds. No rebound or guarding.  Bandage over the left side of the abdominal wall is moist.   LAD: No inguinal or umbilical LAD Extremities:  2-3+ LE edema to the knees Neurologic:  Alert and  oriented x4;  grossly normal neurologically; no asterixis or clonus. Skin: No jaundice. Palmar erythema. Spider angioma on the chest wall.  Multiple ecchymoses.   Psych:  Alert and cooperative. Normal mood and affect.     Tidus Upchurch L. Tarri Glenn, MD, MPH 02/02/2021, 3:29 PM

## 2021-02-03 DIAGNOSIS — E039 Hypothyroidism, unspecified: Secondary | ICD-10-CM | POA: Diagnosis not present

## 2021-02-03 DIAGNOSIS — E871 Hypo-osmolality and hyponatremia: Secondary | ICD-10-CM | POA: Diagnosis not present

## 2021-02-03 DIAGNOSIS — K754 Autoimmune hepatitis: Secondary | ICD-10-CM | POA: Diagnosis not present

## 2021-02-03 DIAGNOSIS — D649 Anemia, unspecified: Secondary | ICD-10-CM | POA: Diagnosis not present

## 2021-02-03 DIAGNOSIS — K7460 Unspecified cirrhosis of liver: Secondary | ICD-10-CM | POA: Diagnosis not present

## 2021-02-03 LAB — IGG: IgG (Immunoglobin G), Serum: 2107 mg/dL — ABNORMAL HIGH (ref 600–1540)

## 2021-02-04 ENCOUNTER — Other Ambulatory Visit: Payer: Self-pay | Admitting: Nurse Practitioner

## 2021-02-04 ENCOUNTER — Other Ambulatory Visit (HOSPITAL_COMMUNITY): Payer: Self-pay | Admitting: Nurse Practitioner

## 2021-02-04 DIAGNOSIS — R188 Other ascites: Secondary | ICD-10-CM | POA: Diagnosis not present

## 2021-02-04 DIAGNOSIS — I8500 Esophageal varices without bleeding: Secondary | ICD-10-CM | POA: Diagnosis not present

## 2021-02-04 DIAGNOSIS — K7469 Other cirrhosis of liver: Secondary | ICD-10-CM | POA: Diagnosis not present

## 2021-02-04 DIAGNOSIS — K754 Autoimmune hepatitis: Secondary | ICD-10-CM | POA: Diagnosis not present

## 2021-02-04 DIAGNOSIS — G8929 Other chronic pain: Secondary | ICD-10-CM | POA: Diagnosis not present

## 2021-02-06 ENCOUNTER — Non-Acute Institutional Stay: Payer: Medicare Other | Admitting: Family Medicine

## 2021-02-06 DIAGNOSIS — R52 Pain, unspecified: Secondary | ICD-10-CM

## 2021-02-06 DIAGNOSIS — R188 Other ascites: Secondary | ICD-10-CM | POA: Diagnosis not present

## 2021-02-06 DIAGNOSIS — F419 Anxiety disorder, unspecified: Secondary | ICD-10-CM | POA: Diagnosis not present

## 2021-02-06 DIAGNOSIS — K7290 Hepatic failure, unspecified without coma: Secondary | ICD-10-CM | POA: Diagnosis not present

## 2021-02-06 DIAGNOSIS — K7460 Unspecified cirrhosis of liver: Secondary | ICD-10-CM

## 2021-02-07 ENCOUNTER — Encounter: Payer: Self-pay | Admitting: Family Medicine

## 2021-02-07 NOTE — Progress Notes (Signed)
[facility] Consult Note Telephone: [phone]  Fax: [phone]    Date of encounter: 02/07/21 2:29 PM PATIENT NAME: Stacey Velazquez [address] [place] [address]   [phone] (home)  DOB: December 06, 1945 MRN: [id number] PRIMARY CARE PROVIDER:    Libby Maw, MD,  [place] [id number] [phone]  REFERRING PROVIDER:   Libby Maw, [place],  [place] [address] [phone]  RESPONSIBLE PARTY:    Contact Information     Name Relation Home Work [place] Daughter [phone]  [phone]   Stacey Velazquez [phone]  [phone]   Velazquez,Stacey Daughter [phone]  [phone]        I met face to face with patient and daughter Stacey Velazquez in  [facility]. Palliative Care was asked to follow this patient by consultation request of  Libby Maw,* to address advance care planning and complex medical decision making. This is an acute follow up visit.                                   ASSESSMENT, SYMPTOM MANAGEMENT AND PLAN / RECOMMENDATIONS:  Pain aggravated by activities of daily living secondary to ascites-Increase Fentanyl TD patch to 25 mcg/hr Q 72 hours, Disp #8, NR. Difficulty with providing any medication for breakthrough pain due to significant allergy. Ascites and BLE edema makes participating with PT, movement with ADLs difficult. Will continue to titrate every 3-7 days to comfort. Educated pt that I need her to be open about her pain level and letting staff/myself know if her pain was not controlled. Anxiety-advised both pt and daughter that SNF will work on safe d/c plan from rehab to find her skilled bed.  Spoke with DON/SW at [employer] who will connect pt to [employer] and business office to start to proceed to long term SNF bed at [facility]. Decompensated hepatic cirrhosis and  ascites-Per notes, pt and daughter, pt will be scheduled for repeat paracentesis.  Unable to take immunosuppressive agents to control autoimmune hepatitis due to neutropenia.  Currently on Prednisone 20 mg daily and expected to continue for the next month.  No other current available treatments.  Pt is not ready yet for Hospice level of care but will likely meet eligibility anytime that changes.  Advance Care Planning/Goals of Care: Goals include to maximize quality of life and symptom management. Health care surrogate gave her permission to discuss. Our advance care planning conversation included a discussion about:     Decision not to resuscitate or to de-escalate disease focused treatments due to poor prognosis. Daughter Stacey Velazquez and pt are aware that she can request Hospice level of care if desired CODE STATUS  Full Code   Follow up Palliative Care Visit: Palliative care will continue to follow for complex medical decision making, advance care planning, and clarification of goals. Return 1 week or prn.  This visit was coded based on medical decision making (MDM).  PPS: 30%  HOSPICE ELIGIBILITY/DIAGNOSIS: TBD  Chief Complaint:  Received call from daughter, Stacey Velazquez, that pt was having significant pain, was unable to participate with physical therapy and had been told by therapist at the facility that she was going to have to be discharged from rehab.  Daughter states pt cannot go home in current condition but doesn't know how to proceed.    HISTORY OF PRESENT ILLNESS:  Stacey Velazquez is a 76 y.o. year old female  with autoimmune hepatitis resulting in decompensated hepatitis and ascites. She has had pain with mobility and BLE edema, fatigue that have limited her ability to participate with physical therapy.  She rates her pain a 10/10 scale pain and has interfered with her ability to watch tv, read or just meditate. She has been noted by her daughter gritting her teeth to keep from screaming. Pain  is interfering with her sleep and contributes to nausea. She states she felt bad that when PT approached her and she wasn't able to participate that she felt that the therapist didn't believe her and then felt like they were going to eminently discharge her and she was not worthwhile.   History obtained from review of EMR, discussion with primary team, and interview with family, facility staff and [name].  I reviewed available labs, medications, imaging, studies and related documents from the EMR.  Records reviewed and summarized above.   ROS General: NAD EYES: denies vision changes ENMT: denies dysphagia Cardiovascular: denies chest pain, some DOE Pulmonary: denies cough, denies increased SOB Abdomen: endorses poor appetite, denies constipation, endorses continence of bowel GU: denies dysuria, endorses continence of urine MSK:  endorses increased weakness/pain in BLE with swelling,  no falls reported Skin: denies rashes or wounds Neurological: endorses difficulty sleeping Psych: Endorses anxiety and depressed mood Heme/lymph/immuno: denies bruises, abnormal bleeding  Physical Exam: Constitutional: NAD General: frail appearing, noted facial grimace, quiet EYES: anicteric sclera, lids intact, no discharge  ENMT: intact hearing, oral mucous membranes moist, dentition intact CV: S1S2, RRR, 3+ LE edema Pulmonary: CTAB, no increased work of breathing, no cough, room air Abdomen:  normo-active BS + 4 quadrants, firm and moderately tender, significant ascites GU: deferred MSK: moves all extremities, ambulatory with difficulty Skin: warm and dry, no rashes or wounds on visible skin Neuro:  mild generalized weakness, no cognitive impairment Psych: depressed affect, A and O x 3 Velazquez/lymph/immuno: no widespread bruising   Thank you for the opportunity to participate in the care of [name]  The palliative care team will continue to follow. Please call our office at [phone] if we  can be of additional assistance.   Marijo Conception, FNP -C  COVID-19 PATIENT SCREENING TOOL Asked and negative response unless otherwise noted:   Have you had symptoms of covid, tested positive or been in contact with someone with symptoms/positive test in the past 5-10 days?  No

## 2021-02-09 ENCOUNTER — Other Ambulatory Visit: Payer: Self-pay

## 2021-02-15 ENCOUNTER — Inpatient Hospital Stay (HOSPITAL_COMMUNITY)
Admission: EM | Admit: 2021-02-15 | Discharge: 2021-02-17 | DRG: 433 | Disposition: A | Discharge status: Hospice | Payer: Medicare Other | Attending: Internal Medicine | Admitting: Internal Medicine

## 2021-02-15 ENCOUNTER — Emergency Department (HOSPITAL_COMMUNITY): Payer: Medicare Other

## 2021-02-15 ENCOUNTER — Other Ambulatory Visit: Payer: Self-pay

## 2021-02-15 ENCOUNTER — Encounter (HOSPITAL_COMMUNITY): Payer: Self-pay

## 2021-02-15 DIAGNOSIS — N184 Chronic kidney disease, stage 4 (severe): Secondary | ICD-10-CM | POA: Diagnosis present

## 2021-02-15 DIAGNOSIS — I129 Hypertensive chronic kidney disease with stage 1 through stage 4 chronic kidney disease, or unspecified chronic kidney disease: Secondary | ICD-10-CM | POA: Diagnosis present

## 2021-02-15 DIAGNOSIS — Z20822 Contact with and (suspected) exposure to covid-19: Secondary | ICD-10-CM | POA: Diagnosis present

## 2021-02-15 DIAGNOSIS — K219 Gastro-esophageal reflux disease without esophagitis: Secondary | ICD-10-CM | POA: Diagnosis not present

## 2021-02-15 DIAGNOSIS — D61818 Other pancytopenia: Secondary | ICD-10-CM | POA: Diagnosis not present

## 2021-02-15 DIAGNOSIS — Z8249 Family history of ischemic heart disease and other diseases of the circulatory system: Secondary | ICD-10-CM

## 2021-02-15 DIAGNOSIS — R188 Other ascites: Secondary | ICD-10-CM | POA: Diagnosis present

## 2021-02-15 DIAGNOSIS — R609 Edema, unspecified: Secondary | ICD-10-CM | POA: Diagnosis not present

## 2021-02-15 DIAGNOSIS — F200 Paranoid schizophrenia: Secondary | ICD-10-CM | POA: Diagnosis present

## 2021-02-15 DIAGNOSIS — Z87891 Personal history of nicotine dependence: Secondary | ICD-10-CM

## 2021-02-15 DIAGNOSIS — I2510 Atherosclerotic heart disease of native coronary artery without angina pectoris: Secondary | ICD-10-CM | POA: Diagnosis present

## 2021-02-15 DIAGNOSIS — T451X5A Adverse effect of antineoplastic and immunosuppressive drugs, initial encounter: Secondary | ICD-10-CM | POA: Diagnosis present

## 2021-02-15 DIAGNOSIS — K7290 Hepatic failure, unspecified without coma: Secondary | ICD-10-CM | POA: Diagnosis present

## 2021-02-15 DIAGNOSIS — E039 Hypothyroidism, unspecified: Secondary | ICD-10-CM | POA: Diagnosis present

## 2021-02-15 DIAGNOSIS — Z743 Need for continuous supervision: Secondary | ICD-10-CM | POA: Diagnosis not present

## 2021-02-15 DIAGNOSIS — Z66 Do not resuscitate: Secondary | ICD-10-CM | POA: Diagnosis present

## 2021-02-15 DIAGNOSIS — R4182 Altered mental status, unspecified: Secondary | ICD-10-CM | POA: Diagnosis not present

## 2021-02-15 DIAGNOSIS — Z515 Encounter for palliative care: Secondary | ICD-10-CM | POA: Diagnosis not present

## 2021-02-15 DIAGNOSIS — D684 Acquired coagulation factor deficiency: Secondary | ICD-10-CM | POA: Diagnosis present

## 2021-02-15 DIAGNOSIS — Z8673 Personal history of transient ischemic attack (TIA), and cerebral infarction without residual deficits: Secondary | ICD-10-CM

## 2021-02-15 DIAGNOSIS — I252 Old myocardial infarction: Secondary | ICD-10-CM

## 2021-02-15 DIAGNOSIS — Z950 Presence of cardiac pacemaker: Secondary | ICD-10-CM | POA: Diagnosis not present

## 2021-02-15 DIAGNOSIS — Z79891 Long term (current) use of opiate analgesic: Secondary | ICD-10-CM

## 2021-02-15 DIAGNOSIS — K754 Autoimmune hepatitis: Secondary | ICD-10-CM | POA: Diagnosis not present

## 2021-02-15 DIAGNOSIS — J9811 Atelectasis: Secondary | ICD-10-CM | POA: Diagnosis not present

## 2021-02-15 DIAGNOSIS — E871 Hypo-osmolality and hyponatremia: Secondary | ICD-10-CM | POA: Diagnosis present

## 2021-02-15 DIAGNOSIS — F419 Anxiety disorder, unspecified: Secondary | ICD-10-CM | POA: Diagnosis present

## 2021-02-15 DIAGNOSIS — Z79899 Other long term (current) drug therapy: Secondary | ICD-10-CM

## 2021-02-15 DIAGNOSIS — D62 Acute posthemorrhagic anemia: Secondary | ICD-10-CM | POA: Diagnosis present

## 2021-02-15 DIAGNOSIS — Z8711 Personal history of peptic ulcer disease: Secondary | ICD-10-CM

## 2021-02-15 DIAGNOSIS — R0602 Shortness of breath: Secondary | ICD-10-CM | POA: Diagnosis not present

## 2021-02-15 DIAGNOSIS — I495 Sick sinus syndrome: Secondary | ICD-10-CM | POA: Diagnosis present

## 2021-02-15 DIAGNOSIS — Z880 Allergy status to penicillin: Secondary | ICD-10-CM

## 2021-02-15 DIAGNOSIS — F319 Bipolar disorder, unspecified: Secondary | ICD-10-CM | POA: Diagnosis not present

## 2021-02-15 DIAGNOSIS — R600 Localized edema: Secondary | ICD-10-CM | POA: Diagnosis not present

## 2021-02-15 DIAGNOSIS — K7460 Unspecified cirrhosis of liver: Principal | ICD-10-CM | POA: Diagnosis present

## 2021-02-15 DIAGNOSIS — G40909 Epilepsy, unspecified, not intractable, without status epilepticus: Secondary | ICD-10-CM | POA: Diagnosis present

## 2021-02-15 DIAGNOSIS — I8510 Secondary esophageal varices without bleeding: Secondary | ICD-10-CM | POA: Diagnosis present

## 2021-02-15 DIAGNOSIS — N281 Cyst of kidney, acquired: Secondary | ICD-10-CM | POA: Diagnosis not present

## 2021-02-15 DIAGNOSIS — M797 Fibromyalgia: Secondary | ICD-10-CM | POA: Diagnosis present

## 2021-02-15 DIAGNOSIS — K766 Portal hypertension: Secondary | ICD-10-CM | POA: Diagnosis not present

## 2021-02-15 DIAGNOSIS — N62 Hypertrophy of breast: Secondary | ICD-10-CM | POA: Diagnosis not present

## 2021-02-15 DIAGNOSIS — D6481 Anemia due to antineoplastic chemotherapy: Secondary | ICD-10-CM | POA: Diagnosis present

## 2021-02-15 DIAGNOSIS — K921 Melena: Secondary | ICD-10-CM | POA: Diagnosis present

## 2021-02-15 DIAGNOSIS — H409 Unspecified glaucoma: Secondary | ICD-10-CM | POA: Diagnosis not present

## 2021-02-15 DIAGNOSIS — I10 Essential (primary) hypertension: Secondary | ICD-10-CM | POA: Diagnosis not present

## 2021-02-15 DIAGNOSIS — R1900 Intra-abdominal and pelvic swelling, mass and lump, unspecified site: Secondary | ICD-10-CM | POA: Diagnosis not present

## 2021-02-15 DIAGNOSIS — G8929 Other chronic pain: Secondary | ICD-10-CM | POA: Diagnosis present

## 2021-02-15 DIAGNOSIS — Z888 Allergy status to other drugs, medicaments and biological substances status: Secondary | ICD-10-CM

## 2021-02-15 DIAGNOSIS — K8689 Other specified diseases of pancreas: Secondary | ICD-10-CM | POA: Diagnosis not present

## 2021-02-15 DIAGNOSIS — Z885 Allergy status to narcotic agent status: Secondary | ICD-10-CM

## 2021-02-15 DIAGNOSIS — Z8679 Personal history of other diseases of the circulatory system: Secondary | ICD-10-CM | POA: Diagnosis not present

## 2021-02-15 DIAGNOSIS — M810 Age-related osteoporosis without current pathological fracture: Secondary | ICD-10-CM | POA: Diagnosis present

## 2021-02-15 DIAGNOSIS — Z85828 Personal history of other malignant neoplasm of skin: Secondary | ICD-10-CM

## 2021-02-15 DIAGNOSIS — Z79890 Hormone replacement therapy (postmenopausal): Secondary | ICD-10-CM

## 2021-02-15 IMAGING — CT CT CHEST-ABD-PELV W/O CM
2 of 4 series · 14 of 36 positions shown, 16 images · non-contrast
Comparison: [DATE].

CLINICAL DATA: Abdominal pain, ascites.  History of cirrhosis.



[Series 2: cap w/o · axial · non-contrast · 0.93mm/px · z∈[-653,-103]mm · 11 of 133 slices shown, 13 images]
[im 12/133  mediastinal]
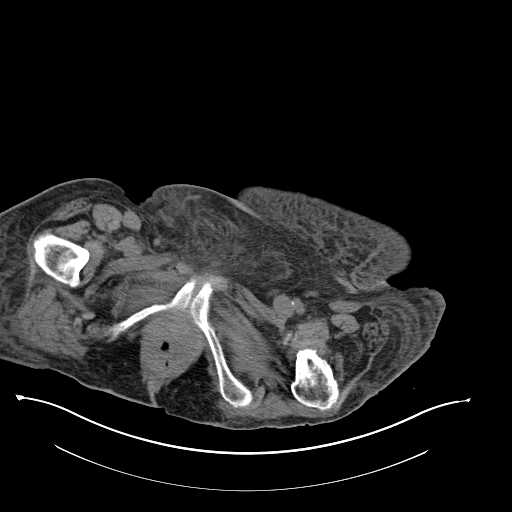
[im 12/133  bone]
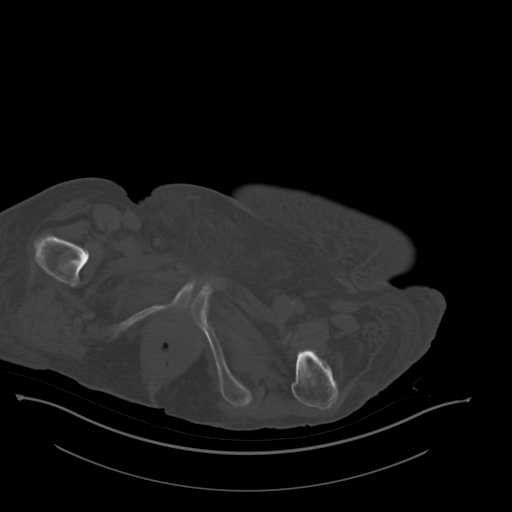
[im 23/133  mediastinal]
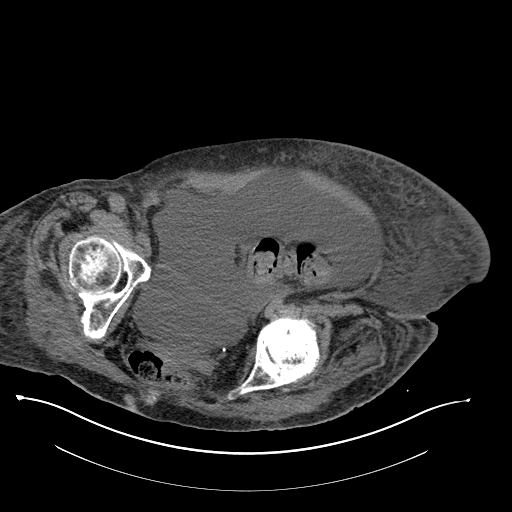
[im 34/133  mediastinal]
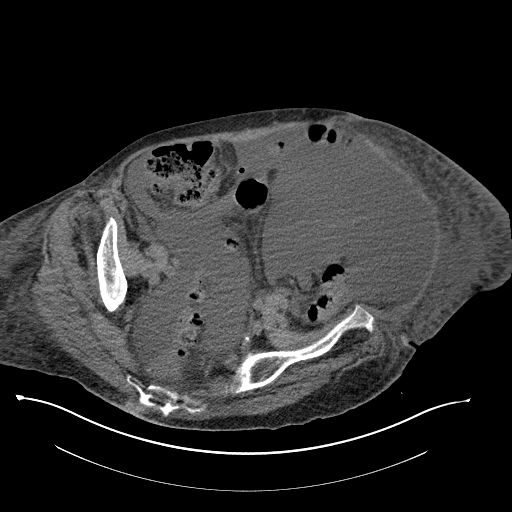
[im 45/133  mediastinal]
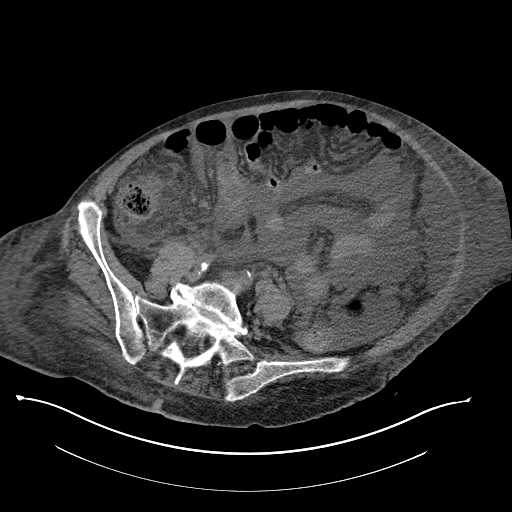
[im 56/133  mediastinal]
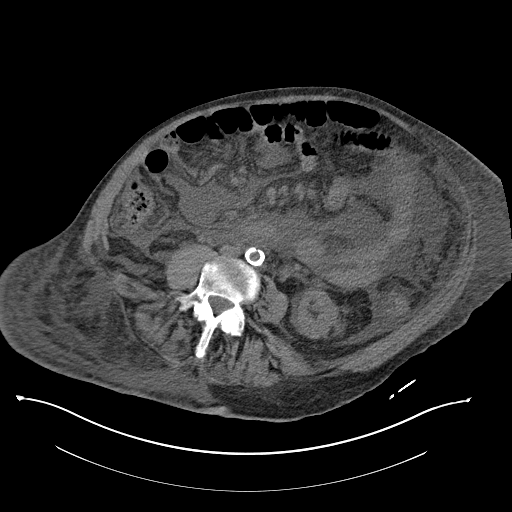
[im 67/133  mediastinal]
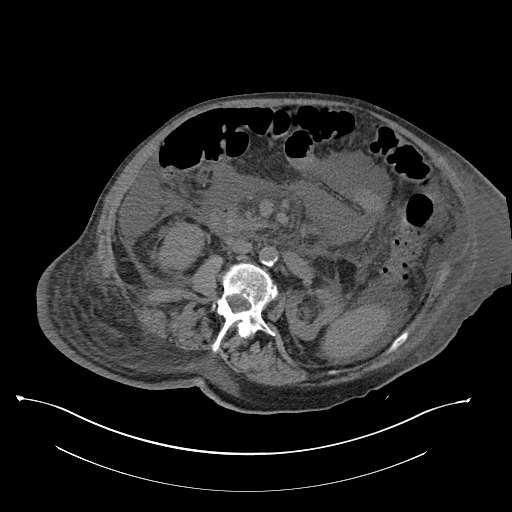
[im 78/133  mediastinal]
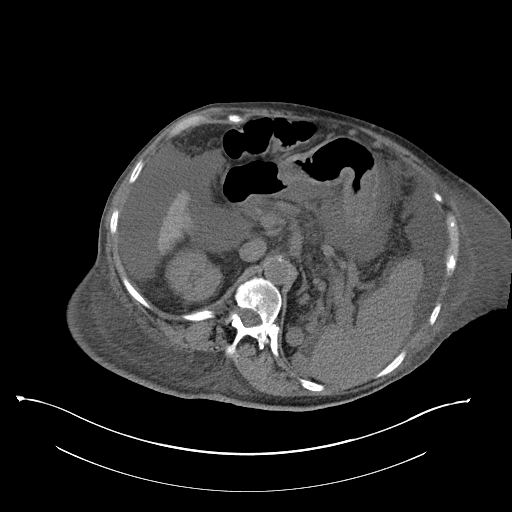
[im 89/133  mediastinal]
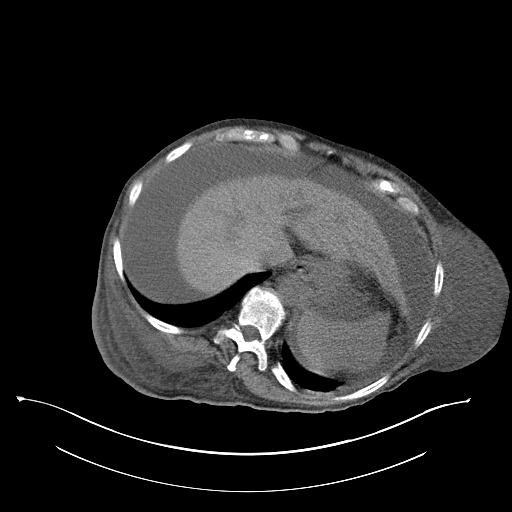
[im 100/133  mediastinal]
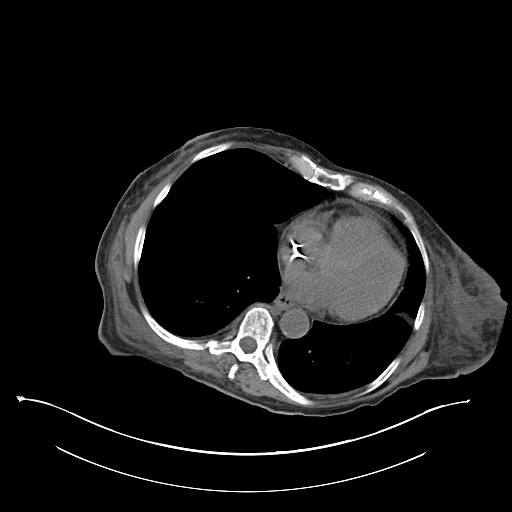
[im 100/133  bone]
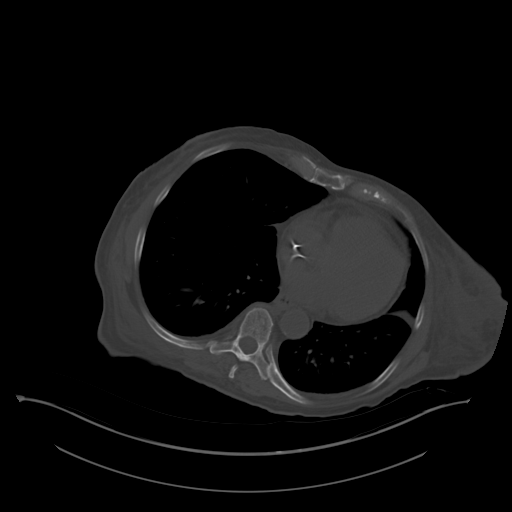
[im 111/133  mediastinal]
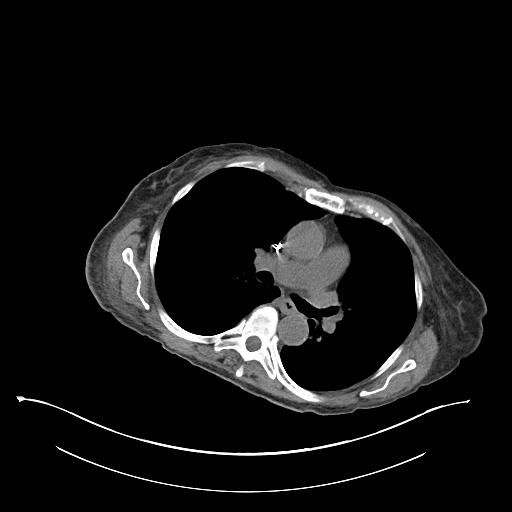
[im 122/133  mediastinal]
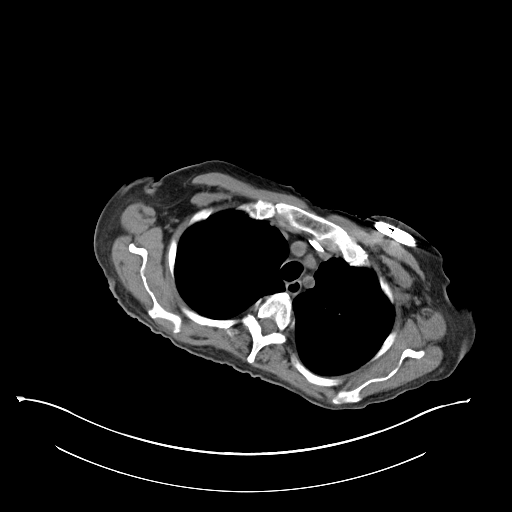

[Series 4: coronals · coronal · 0.83mm/px · 3 of 196 slices shown]
[im 40/196  mediastinal]
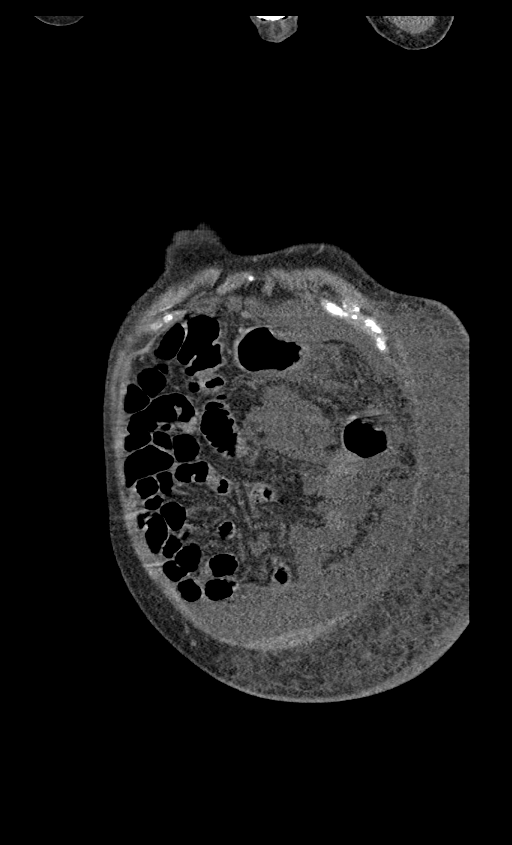
[im 79/196  mediastinal]
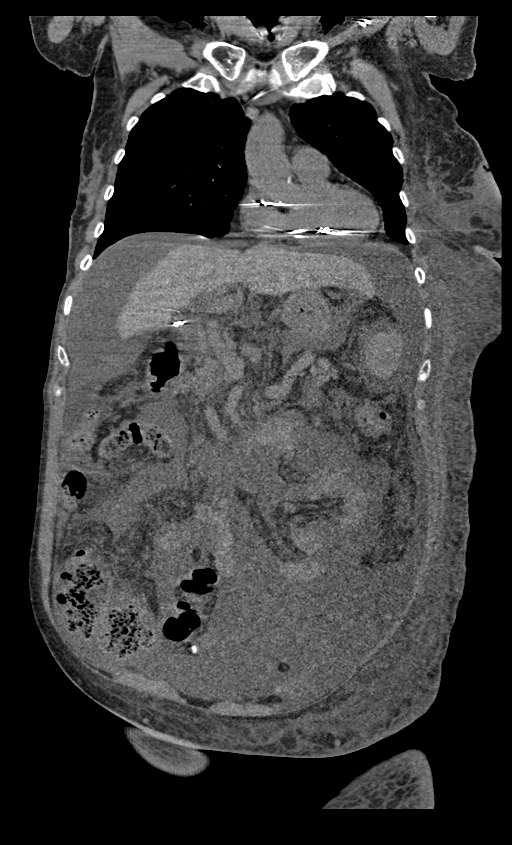
[im 118/196  mediastinal]
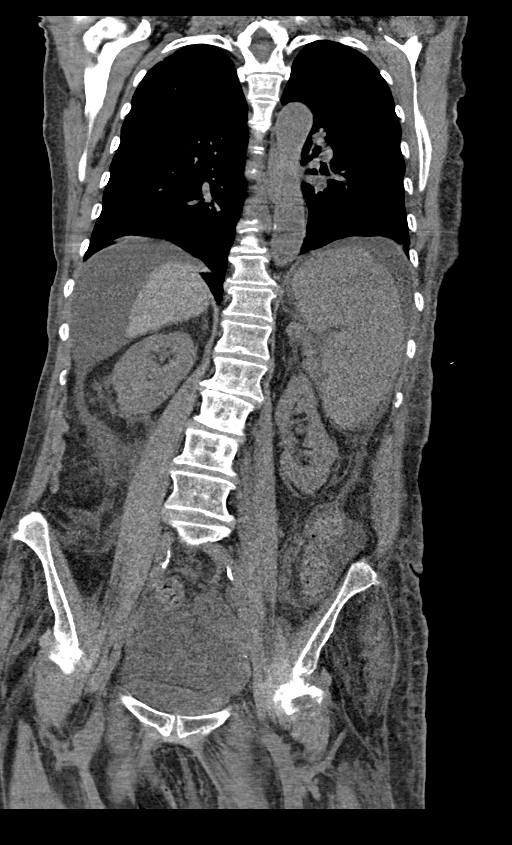

[14 of 36 positions shown; findings below may reference images not displayed]

FINDINGS: CT CHEST FINDINGS

Cardiovascular: The heart is normal in size and there is a trace
pericardial effusion. Pacemaker leads are present within the heart.
Scattered coronary artery calcifications are noted. There is
atherosclerotic calcification of the aorta without evidence of
aneurysm. The pulmonary trunk is normal in caliber.

Mediastinum/Nodes: No mediastinal or axillary lymphadenopathy.
Evaluation of the hila is limited due to lack of IV contrast. The
thyroid gland is not visualized on exam. The trachea and esophagus
are within normal limits.

Lungs/Pleura: Atelectasis is present at the left lung base.
Scattered 2-3 mm pulmonary nodules are noted bilaterally. No
pneumothorax.

Musculoskeletal: There is asymmetric enlargement of the left breast
with significant fat stranding and subareolar edema which continues
into the lateral chest wall. Degenerative changes are present in the
thoracic spine. No acute fracture.

CT ABDOMEN PELVIS FINDINGS

Hepatobiliary: The liver small and has a nodular contour, compatible
with underlying cirrhosis. The gallbladder is surgically absent. No
biliary ductal dilatation.

Pancreas: Pancreatic atrophy is noted. No pancreatic ductal
dilatation or surrounding inflammatory changes.

Spleen: The spleen is enlarged measuring 15.1 cm in length.

Adrenals/Urinary Tract: The adrenal glands are within normal limits.
A cyst is noted in the mid left kidney. A hyperdense lesion is noted
in the lower pole of the left kidney measuring 1 cm, corresponding
to cyst on prior exam. No renal calculus or hydronephrosis. The
bladder is within normal limits.

Stomach/Bowel: The stomach is within normal limits. No bowel
obstruction, free air, or pneumatosis. Evaluation of the bowel is
limited due to ascites. Scattered diverticula are present along the
colon without evidence of diverticulitis. No dilated bowel loops.
The appendix is not visualized on exam.

Vascular/Lymphatic: Varices are noted in the left upper quadrant.
There is atherosclerotic calcification of the aorta without evidence
of aneurysm. No abdominopelvic lymphadenopathy.

Reproductive: Status post hysterectomy. No adnexal masses.

Other: Large ascites is noted.

Musculoskeletal: Degenerative changes in the lumbar spine and left
hip. No acute osseous abnormality. Anasarca is present.
IMPRESSION: 1. Large ascites.
2. Morphologic changes of cirrhosis and portal hypertension.
3. Anasarca. There is asymmetric enlargement of the left breast with
subcutaneous fat stranding and edema extending into the left chest
wall, which may be due to ascites. Correlation with physical exam is
recommended.
4. No acute process in the chest.
5. Coronary artery calcifications.
6. Aortic atherosclerosis.
7. Remaining chronic findings as described above.

## 2021-02-15 NOTE — ED Triage Notes (Signed)
Pt arrives EMS from [facility] with c/o worsening cirrhosis and ascites. Pt seen at [facility] and had a lot of fluid removed in December 2022. Was started on fentanyl patch for pain per pt. Swelling has progressed making it difficult to breathe.

## 2021-02-16 ENCOUNTER — Inpatient Hospital Stay (HOSPITAL_COMMUNITY): Payer: Medicare Other

## 2021-02-16 ENCOUNTER — Encounter (HOSPITAL_COMMUNITY): Payer: Self-pay | Admitting: Internal Medicine

## 2021-02-16 ENCOUNTER — Other Ambulatory Visit (HOSPITAL_COMMUNITY): Payer: Medicare Other

## 2021-02-16 DIAGNOSIS — R4182 Altered mental status, unspecified: Secondary | ICD-10-CM | POA: Diagnosis not present

## 2021-02-16 DIAGNOSIS — I2510 Atherosclerotic heart disease of native coronary artery without angina pectoris: Secondary | ICD-10-CM | POA: Diagnosis present

## 2021-02-16 DIAGNOSIS — F319 Bipolar disorder, unspecified: Secondary | ICD-10-CM | POA: Diagnosis present

## 2021-02-16 DIAGNOSIS — Z743 Need for continuous supervision: Secondary | ICD-10-CM | POA: Diagnosis not present

## 2021-02-16 DIAGNOSIS — K754 Autoimmune hepatitis: Secondary | ICD-10-CM

## 2021-02-16 DIAGNOSIS — K7290 Hepatic failure, unspecified without coma: Secondary | ICD-10-CM | POA: Diagnosis not present

## 2021-02-16 DIAGNOSIS — I495 Sick sinus syndrome: Secondary | ICD-10-CM | POA: Diagnosis present

## 2021-02-16 DIAGNOSIS — Z950 Presence of cardiac pacemaker: Secondary | ICD-10-CM | POA: Diagnosis not present

## 2021-02-16 DIAGNOSIS — K219 Gastro-esophageal reflux disease without esophagitis: Secondary | ICD-10-CM | POA: Diagnosis not present

## 2021-02-16 DIAGNOSIS — E039 Hypothyroidism, unspecified: Secondary | ICD-10-CM | POA: Diagnosis present

## 2021-02-16 DIAGNOSIS — K7460 Unspecified cirrhosis of liver: Principal | ICD-10-CM

## 2021-02-16 DIAGNOSIS — D61818 Other pancytopenia: Secondary | ICD-10-CM | POA: Diagnosis present

## 2021-02-16 DIAGNOSIS — F419 Anxiety disorder, unspecified: Secondary | ICD-10-CM | POA: Diagnosis present

## 2021-02-16 DIAGNOSIS — H409 Unspecified glaucoma: Secondary | ICD-10-CM | POA: Diagnosis not present

## 2021-02-16 DIAGNOSIS — R600 Localized edema: Secondary | ICD-10-CM

## 2021-02-16 DIAGNOSIS — F200 Paranoid schizophrenia: Secondary | ICD-10-CM | POA: Diagnosis present

## 2021-02-16 DIAGNOSIS — Z66 Do not resuscitate: Secondary | ICD-10-CM | POA: Diagnosis present

## 2021-02-16 DIAGNOSIS — N184 Chronic kidney disease, stage 4 (severe): Secondary | ICD-10-CM | POA: Diagnosis present

## 2021-02-16 DIAGNOSIS — K921 Melena: Secondary | ICD-10-CM | POA: Diagnosis present

## 2021-02-16 DIAGNOSIS — D62 Acute posthemorrhagic anemia: Secondary | ICD-10-CM | POA: Diagnosis present

## 2021-02-16 DIAGNOSIS — M797 Fibromyalgia: Secondary | ICD-10-CM | POA: Diagnosis present

## 2021-02-16 DIAGNOSIS — R188 Other ascites: Secondary | ICD-10-CM | POA: Diagnosis present

## 2021-02-16 DIAGNOSIS — E871 Hypo-osmolality and hyponatremia: Secondary | ICD-10-CM | POA: Diagnosis present

## 2021-02-16 DIAGNOSIS — I129 Hypertensive chronic kidney disease with stage 1 through stage 4 chronic kidney disease, or unspecified chronic kidney disease: Secondary | ICD-10-CM | POA: Diagnosis present

## 2021-02-16 DIAGNOSIS — K766 Portal hypertension: Secondary | ICD-10-CM | POA: Diagnosis present

## 2021-02-16 DIAGNOSIS — Z515 Encounter for palliative care: Secondary | ICD-10-CM | POA: Diagnosis not present

## 2021-02-16 DIAGNOSIS — D684 Acquired coagulation factor deficiency: Secondary | ICD-10-CM | POA: Diagnosis present

## 2021-02-16 DIAGNOSIS — I8510 Secondary esophageal varices without bleeding: Secondary | ICD-10-CM | POA: Diagnosis present

## 2021-02-16 DIAGNOSIS — Z8679 Personal history of other diseases of the circulatory system: Secondary | ICD-10-CM | POA: Diagnosis not present

## 2021-02-16 DIAGNOSIS — Z20822 Contact with and (suspected) exposure to covid-19: Secondary | ICD-10-CM | POA: Diagnosis present

## 2021-02-16 DIAGNOSIS — G40909 Epilepsy, unspecified, not intractable, without status epilepticus: Secondary | ICD-10-CM | POA: Diagnosis present

## 2021-02-16 LAB — COMPREHENSIVE METABOLIC PANEL
ALT: 24 U/L (ref 0–44)
ALT: 24 U/L (ref 0–44)
AST: 23 U/L (ref 15–41)
AST: 24 U/L (ref 15–41)
Albumin: 2.3 g/dL — ABNORMAL LOW (ref 3.5–5.0)
Albumin: 2.5 g/dL — ABNORMAL LOW (ref 3.5–5.0)
Alkaline Phosphatase: 55 U/L (ref 38–126)
Alkaline Phosphatase: 59 U/L (ref 38–126)
Anion gap: 4 — ABNORMAL LOW (ref 5–15)
Anion gap: 5 (ref 5–15)
BUN: 33 mg/dL — ABNORMAL HIGH (ref 8–23)
BUN: 35 mg/dL — ABNORMAL HIGH (ref 8–23)
CO2: 21 mmol/L — ABNORMAL LOW (ref 22–32)
CO2: 23 mmol/L (ref 22–32)
Calcium: 7.8 mg/dL — ABNORMAL LOW (ref 8.9–10.3)
Calcium: 7.9 mg/dL — ABNORMAL LOW (ref 8.9–10.3)
Chloride: 103 mmol/L (ref 98–111)
Chloride: 105 mmol/L (ref 98–111)
Creatinine, Ser: 0.9 mg/dL (ref 0.44–1.00)
Creatinine, Ser: 0.96 mg/dL (ref 0.44–1.00)
GFR, Estimated: >60 mL/min (ref >60)
GFR, Estimated: >60 mL/min (ref >60)
Glucose, Bld: 111 mg/dL — ABNORMAL HIGH (ref 70–99)
Glucose, Bld: 90 mg/dL (ref 70–99)
Potassium: 3.5 mmol/L (ref 3.5–5.1)
Potassium: 4 mmol/L (ref 3.5–5.1)
Sodium: 129 mmol/L — ABNORMAL LOW (ref 135–145)
Sodium: 132 mmol/L — ABNORMAL LOW (ref 135–145)
Total Bilirubin: 0.9 mg/dL (ref 0.3–1.2)
Total Bilirubin: 1 mg/dL (ref 0.3–1.2)
Total Protein: 4.8 g/dL — ABNORMAL LOW (ref 6.5–8.1)
Total Protein: 5.1 g/dL — ABNORMAL LOW (ref 6.5–8.1)

## 2021-02-16 LAB — CBC
HCT: 29.8 % — ABNORMAL LOW (ref 36.0–46.0)
Hemoglobin: 10.1 g/dL — ABNORMAL LOW (ref 12.0–15.0)
MCH: 32.3 pg (ref 26.0–34.0)
MCHC: 33.9 g/dL (ref 30.0–36.0)
MCV: 95.2 fL (ref 80.0–100.0)
Platelets: 61 10*3/uL — ABNORMAL LOW (ref 150–400)
RBC: 3.13 MIL/uL — ABNORMAL LOW (ref 3.87–5.11)
RDW: 19.7 % — ABNORMAL HIGH (ref 11.5–15.5)
WBC: 2.4 10*3/uL — ABNORMAL LOW (ref 4.0–10.5)
nRBC: 0 % (ref 0.0–0.2)

## 2021-02-16 LAB — ECHOCARDIOGRAM COMPLETE
AR max vel: 2.7 cm2
AV Peak grad: 8.8 mmHg
Ao pk vel: 1.48 m/s
Area-P 1/2: 3.27 cm2
Height: 69 in
S' Lateral: 1.9 cm
Single Plane A4C EF: 76.4 %
Weight: 3255.75 oz

## 2021-02-16 LAB — URINALYSIS, ROUTINE W REFLEX MICROSCOPIC
Bilirubin Urine: NEGATIVE
Glucose, UA: NEGATIVE mg/dL
Hgb urine dipstick: NEGATIVE
Ketones, ur: NEGATIVE mg/dL
Nitrite: NEGATIVE
Protein, ur: NEGATIVE mg/dL
Specific Gravity, Urine: 1.017 (ref 1.005–1.030)
pH: 6 (ref 5.0–8.0)

## 2021-02-16 LAB — ALBUMIN, PLEURAL OR PERITONEAL FLUID: Albumin, Fluid: <1.5 g/dL

## 2021-02-16 LAB — CBC WITH DIFFERENTIAL/PLATELET
Abs Immature Granulocytes: 0.03 10*3/uL (ref 0.00–0.07)
Basophils Absolute: 0 10*3/uL (ref 0.0–0.1)
Basophils Relative: 0 %
Eosinophils Absolute: 0 10*3/uL (ref 0.0–0.5)
Eosinophils Relative: 0 %
HCT: 30.9 % — ABNORMAL LOW (ref 36.0–46.0)
Hemoglobin: 10.4 g/dL — ABNORMAL LOW (ref 12.0–15.0)
Immature Granulocytes: 1 %
Lymphocytes Relative: 15 %
Lymphs Abs: 0.4 10*3/uL — ABNORMAL LOW (ref 0.7–4.0)
MCH: 31.4 pg (ref 26.0–34.0)
MCHC: 33.7 g/dL (ref 30.0–36.0)
MCV: 93.4 fL (ref 80.0–100.0)
Monocytes Absolute: 0.2 10*3/uL (ref 0.1–1.0)
Monocytes Relative: 6 %
Neutro Abs: 2 10*3/uL (ref 1.7–7.7)
Neutrophils Relative %: 78 %
Platelets: 66 10*3/uL — ABNORMAL LOW (ref 150–400)
RBC: 3.31 MIL/uL — ABNORMAL LOW (ref 3.87–5.11)
RDW: 19.5 % — ABNORMAL HIGH (ref 11.5–15.5)
WBC: 2.5 10*3/uL — ABNORMAL LOW (ref 4.0–10.5)
nRBC: 0 % (ref 0.0–0.2)

## 2021-02-16 LAB — BODY FLUID CELL COUNT WITH DIFFERENTIAL
Lymphs, Fluid: 31 %
Monocyte-Macrophage-Serous Fluid: 59 % (ref 50–90)
Neutrophil Count, Fluid: 10 % (ref 0–25)
Total Nucleated Cell Count, Fluid: 45 cu mm (ref 0–1000)

## 2021-02-16 LAB — PROTIME-INR
INR: 1.7 — ABNORMAL HIGH (ref 0.8–1.2)
Prothrombin Time: 19.8 seconds — ABNORMAL HIGH (ref 11.4–15.2)

## 2021-02-16 LAB — GLUCOSE, PLEURAL OR PERITONEAL FLUID: Glucose, Fluid: 110 mg/dL

## 2021-02-16 LAB — PROTEIN, PLEURAL OR PERITONEAL FLUID: Total protein, fluid: <3 g/dL

## 2021-02-16 LAB — RESP PANEL BY RT-PCR (FLU A&B, COVID) ARPGX2
Influenza A by PCR: NEGATIVE
Influenza B by PCR: NEGATIVE
SARS Coronavirus 2 by RT PCR: NEGATIVE

## 2021-02-16 LAB — LIPASE, BLOOD: Lipase: 33 U/L (ref 11–51)

## 2021-02-16 LAB — BRAIN NATRIURETIC PEPTIDE: B Natriuretic Peptide: 37.3 pg/mL (ref 0.0–100.0)

## 2021-02-16 IMAGING — US US PARACENTESIS
1 series · 4 of 4 positions shown · non-contrast
Comparison: none

INDICATION: Patient with history of autoimmune hepatitis, cirrhosis, esophageal
varices, portal hypertension, recurrent ascites. Request received
for diagnostic and therapeutic paracentesis up to 5 liters.

[Series 1: us paracentesis mc & wl · 4 of 4 slices shown]
[im 1/4]
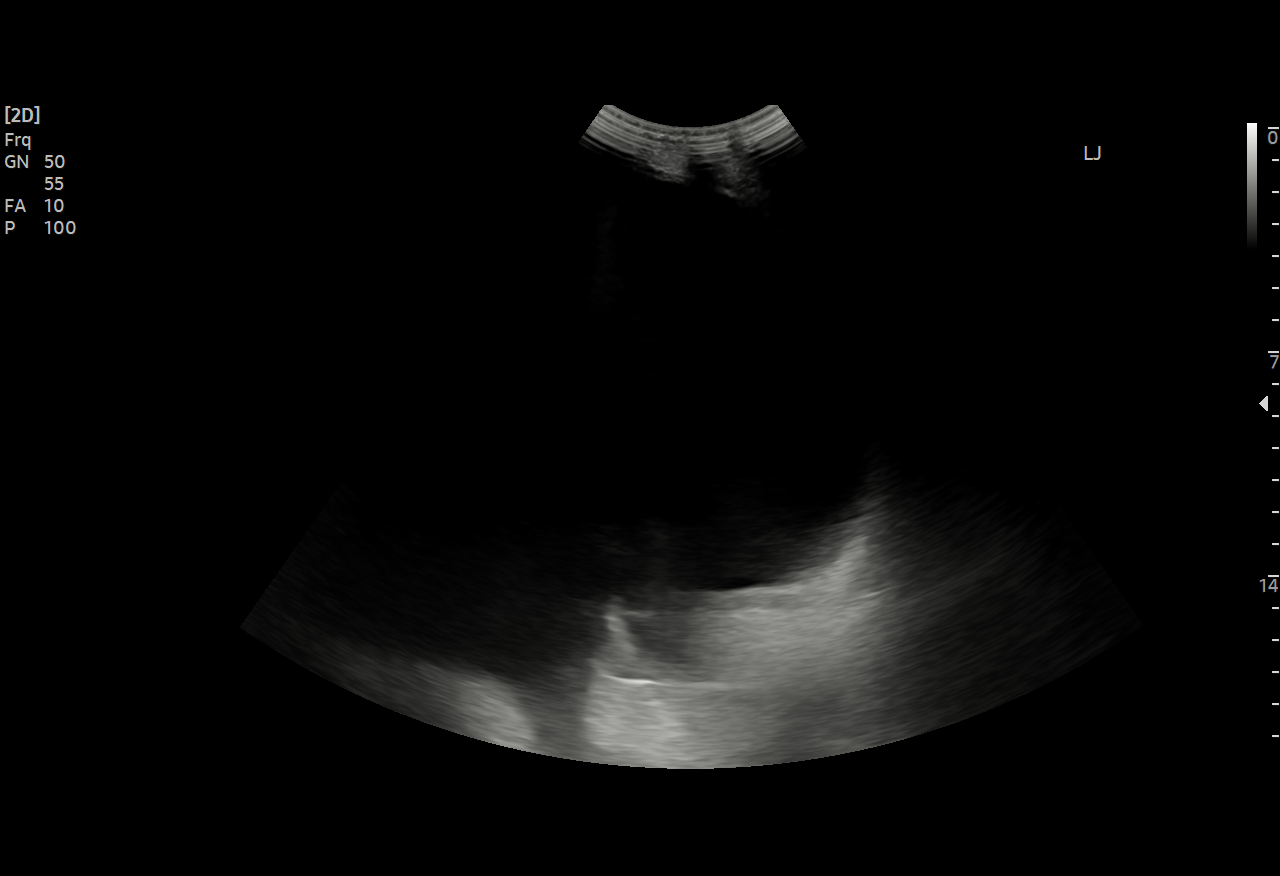
[im 2/4]
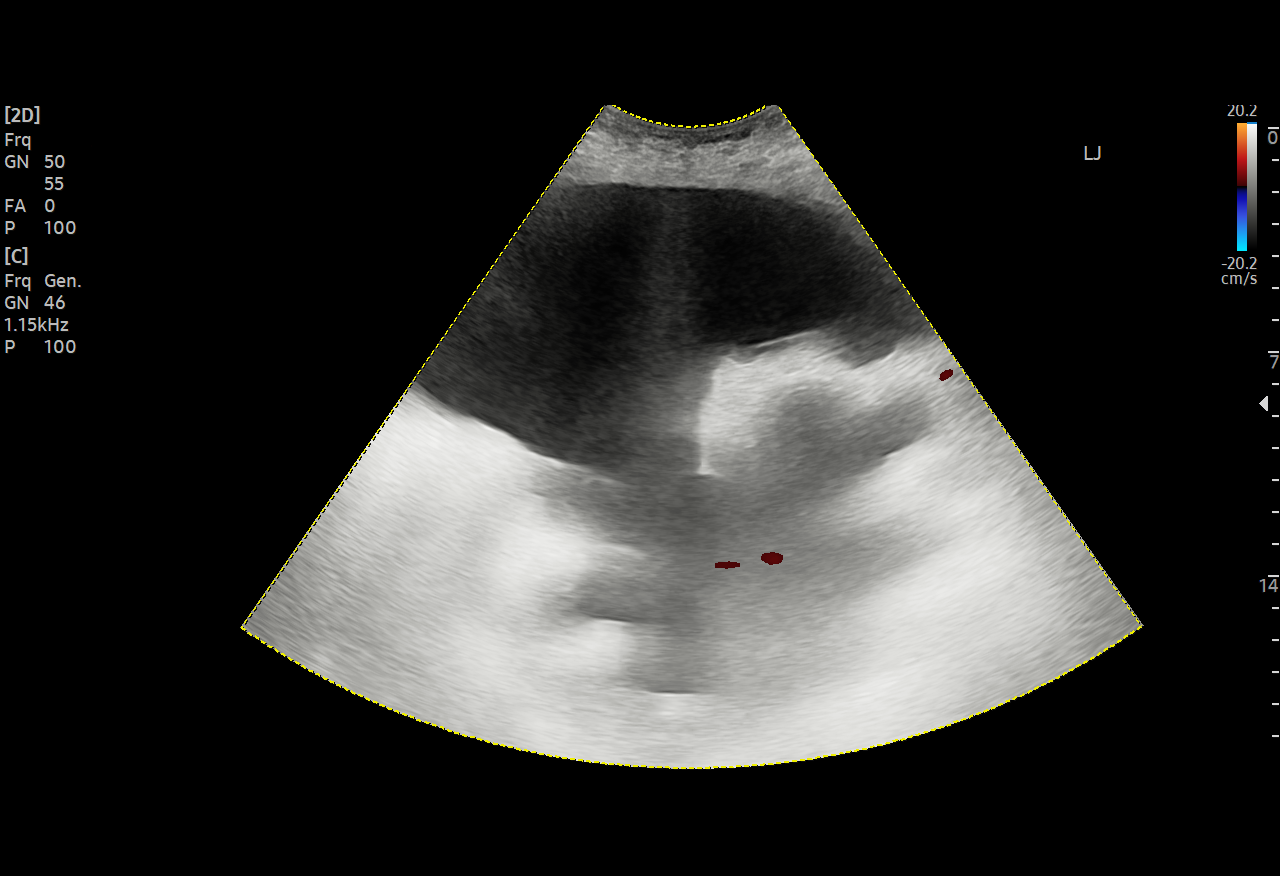
[im 3/4]
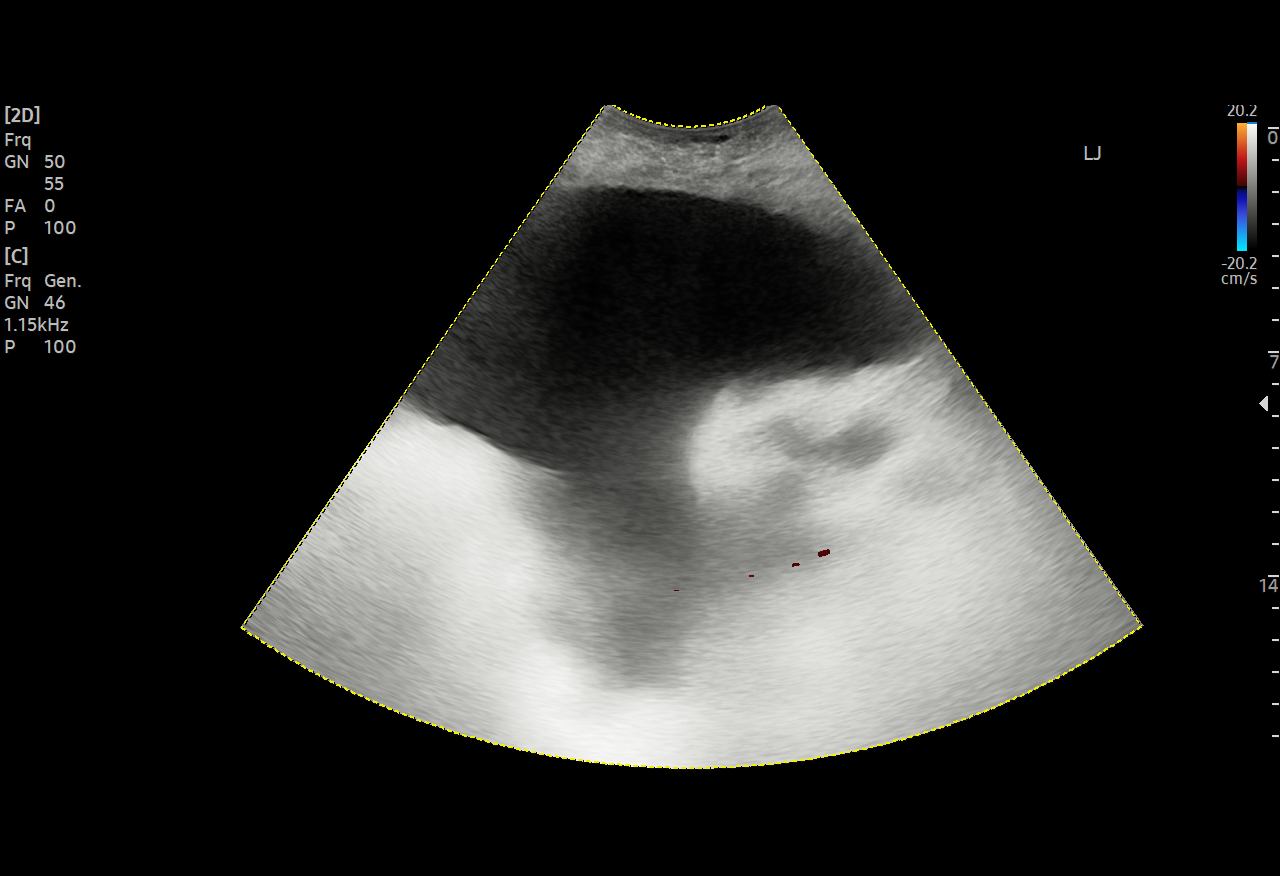
[im 4/4]
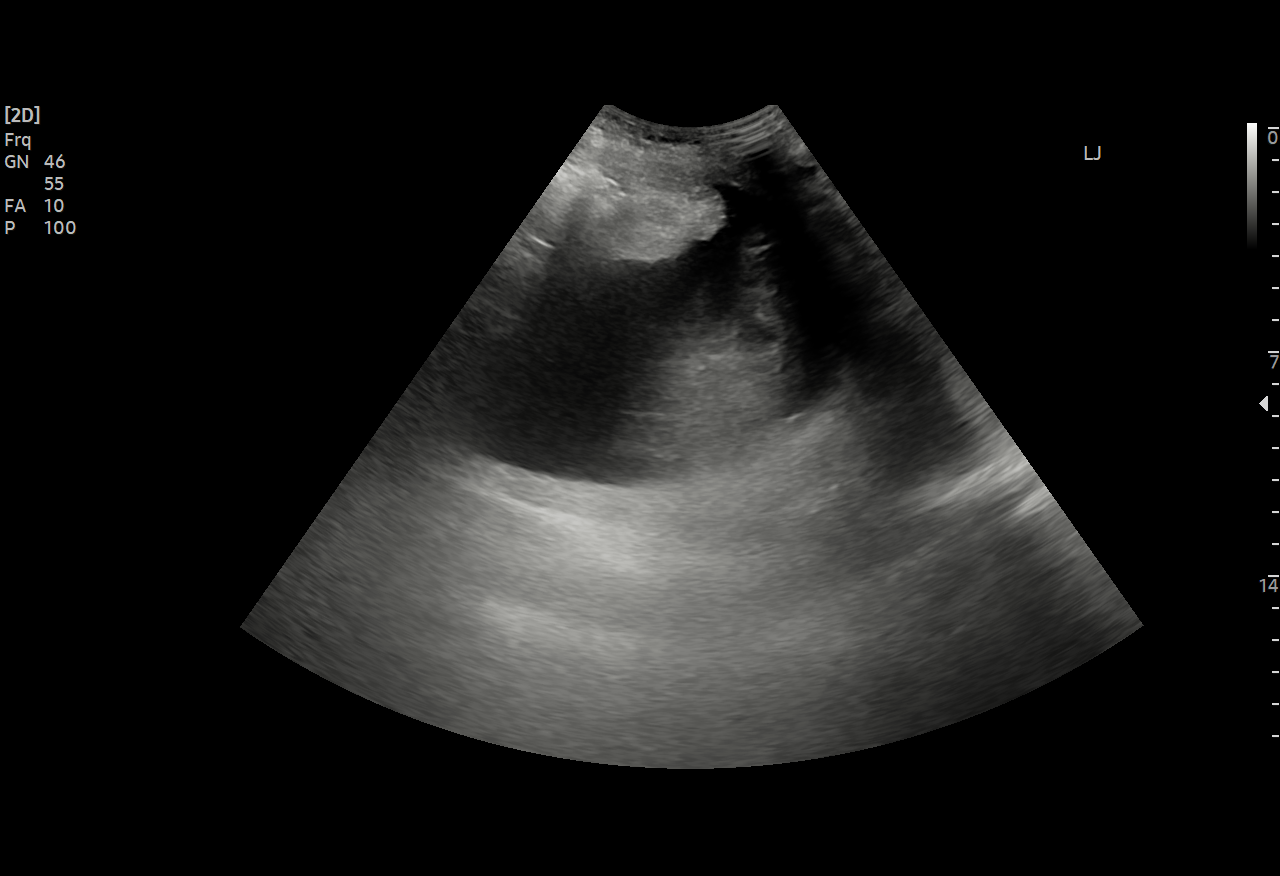

[4 of 4 positions shown; findings below may reference images not displayed]

EXAM:
ULTRASOUND GUIDED DIAGNOSTIC AND THERAPEUTIC PARACENTESIS

MEDICATIONS:
2 mL 1% tetracaine

COMPLICATIONS:
None immediate.

PROCEDURE:
Informed written consent was obtained from the patient after a
discussion of the risks, benefits and alternatives to treatment. A
timeout was performed prior to the initiation of the procedure.

Initial ultrasound scanning demonstrates a large amount of ascites
within the left lower abdominal quadrant. The left lower abdomen was
prepped and draped in the usual sterile fashion. 1% lidocaine was
used for local anesthesia.

Following this, a 19 gauge, 10-cm, Yueh catheter was introduced. An
ultrasound image was saved for documentation purposes. The
paracentesis was performed. The catheter was removed and a dressing
was applied. The patient tolerated the procedure well without
immediate post procedural complication.
Patient received post-procedure intravenous albumin; see nursing
notes for details.
FINDINGS: A total of approximately 5 liters of clear, yellow fluid was
removed. Samples were sent to the laboratory as requested by the
clinical team.
IMPRESSION: Successful ultrasound-guided diagnostic and therapeutic paracentesis
yielding 5 liters of peritoneal fluid.

## 2021-02-16 MED ORDER — MELATONIN 3 MG PO TABS
6.0000 mg | ORAL_TABLET | Freq: Every day | ORAL | Status: DC
  Administered 2021-02-16: 6 mg via ORAL
  Filled 2021-02-16: qty 2

## 2021-02-16 MED ORDER — SPIRONOLACTONE 12.5 MG HALF TABLET
12.5000 mg | ORAL_TABLET | Freq: Every day | ORAL | Status: DC
  Administered 2021-02-16: 12.5 mg via ORAL
  Filled 2021-02-16: qty 1

## 2021-02-16 MED ORDER — CIPROFLOXACIN IN D5W 400 MG/200ML IV SOLN
400.0000 mg | Freq: Two times a day (BID) | INTRAVENOUS | Status: DC
  Administered 2021-02-16 – 2021-02-17 (×3): 400 mg via INTRAVENOUS
  Filled 2021-02-16 (×3): qty 200

## 2021-02-16 MED ORDER — RISAQUAD PO CAPS
1.0000 | ORAL_CAPSULE | Freq: Every day | ORAL | Status: DC
  Administered 2021-02-16: 1 via ORAL
  Filled 2021-02-16: qty 1

## 2021-02-16 MED ORDER — DIGESTIVE ADV DIGESTIVE/IMMUNE PO CHEW: 2.0000 | CHEWABLE_TABLET | Freq: Every day | ORAL | Status: DC

## 2021-02-16 MED ORDER — TETRACAINE HCL 1 % IJ SOLN
20.0000 mg | INTRAMUSCULAR | Status: AC
  Filled 2021-02-16: qty 2

## 2021-02-16 MED ORDER — ENSURE ENLIVE PO LIQD
237.0000 mL | Freq: Three times a day (TID) | ORAL | Status: DC
  Administered 2021-02-16 – 2021-02-17 (×3): 237 mL via ORAL

## 2021-02-16 MED ORDER — LEVOTHYROXINE SODIUM 50 MCG PO TABS
50.0000 ug | ORAL_TABLET | Freq: Every day | ORAL | Status: DC
  Administered 2021-02-16 – 2021-02-17 (×2): 50 ug via ORAL
  Filled 2021-02-16 (×2): qty 1

## 2021-02-16 MED ORDER — FENTANYL 25 MCG/HR TD PT72
1.0000 | MEDICATED_PATCH | TRANSDERMAL | Status: DC
  Administered 2021-02-16: 1 via TRANSDERMAL
  Filled 2021-02-16: qty 1

## 2021-02-16 MED ORDER — SPIRONOLACTONE 25 MG PO TABS
50.0000 mg | ORAL_TABLET | Freq: Every day | ORAL | Status: DC
  Administered 2021-02-17: 11:00:00 50 mg via ORAL
  Filled 2021-02-16: qty 2

## 2021-02-16 MED ORDER — POLYETHYLENE GLYCOL 3350 17 G PO PACK
17.0000 g | PACK | Freq: Every day | ORAL | Status: DC
  Administered 2021-02-17: 11:00:00 17 g via ORAL
  Filled 2021-02-16 (×2): qty 1

## 2021-02-16 MED ORDER — TETRACAINE HCL 1 % IJ SOLN: 20.0000 mg | Freq: Once | INTRAMUSCULAR | Status: DC

## 2021-02-16 MED ORDER — TAMSULOSIN HCL 0.4 MG PO CAPS
0.4000 mg | ORAL_CAPSULE | Freq: Every day | ORAL | Status: DC
  Administered 2021-02-16: 0.4 mg via ORAL
  Filled 2021-02-16: qty 1

## 2021-02-16 MED ORDER — DOCUSATE SODIUM 100 MG PO CAPS
100.0000 mg | ORAL_CAPSULE | Freq: Every day | ORAL | Status: DC
  Administered 2021-02-16 – 2021-02-17 (×2): 100 mg via ORAL
  Filled 2021-02-16 (×2): qty 1

## 2021-02-16 MED ORDER — FAMOTIDINE 20 MG PO TABS
20.0000 mg | ORAL_TABLET | Freq: Two times a day (BID) | ORAL | Status: DC
  Administered 2021-02-16 – 2021-02-17 (×3): 20 mg via ORAL
  Filled 2021-02-16 (×3): qty 1

## 2021-02-16 MED ORDER — NUTRITIONAL SHAKE PLUS PROTEIN PO LIQD: Freq: Three times a day (TID) | ORAL | Status: DC

## 2021-02-16 MED ORDER — MIDODRINE HCL 5 MG PO TABS
5.0000 mg | ORAL_TABLET | Freq: Two times a day (BID) | ORAL | Status: DC
  Administered 2021-02-16 – 2021-02-17 (×3): 5 mg via ORAL
  Filled 2021-02-16 (×4): qty 1

## 2021-02-16 MED ORDER — FUROSEMIDE 20 MG PO TABS
20.0000 mg | ORAL_TABLET | Freq: Every day | ORAL | Status: DC
  Administered 2021-02-17: 11:00:00 20 mg via ORAL
  Filled 2021-02-16: qty 1

## 2021-02-16 MED ORDER — ALBUMIN HUMAN 25 % IV SOLN
25.0000 g | Freq: Four times a day (QID) | INTRAVENOUS | Status: AC
  Administered 2021-02-16 (×4): 25 g via INTRAVENOUS
  Filled 2021-02-16 (×4): qty 100

## 2021-02-16 MED ORDER — PREDNISONE 20 MG PO TABS
20.0000 mg | ORAL_TABLET | Freq: Every day | ORAL | Status: DC
  Administered 2021-02-16 – 2021-02-17 (×2): 20 mg via ORAL
  Filled 2021-02-16 (×2): qty 1

## 2021-02-16 NOTE — H&P (Signed)
History and Physical    Stacey Velazquez TJQ:[id number] DOB: Oct 17, 1945 DOA: 02/15/2021  PCP: Libby Maw, MD  Patient coming from: Skilled nursing facility.  Chief Complaint: Abdominal distention and peripheral edema.  HPI: Stacey Velazquez is a 76 y.o. female with history of cirrhosis of the liver with autoimmune hepatitis who was recently admitted last month for decompensated liver cirrhosis at that time stay was complicated with renal failure pancytopenia during which patient's immunosuppressants were held except for prednisone presents to the ER after patient has been having increasing abdominal distention with peripheral edema and pain.  He was not sure if patient has still been still taking her prednisone.  Patient's diuretics was discontinued due to recent renal failure.  ED Course: In the ER patient has significant peripheral edema with anasarca picture.  CT chest abdomen pelvis shows anasarca and large volume ascites.  Labs also shows pancytopenia.  Albumin of 2.5.  EKG low voltage.  COVID test negative patient admitted for decompensated liver cirrhosis.  Review of Systems: As per HPI, rest all negative.   Past Medical History:  Diagnosis Date   Adenomatous polyp of ascending colon    Anxiety    Bilateral lower extremity edema    burning sensations   Bipolar 1 disorder (HCC)    Carpal tunnel syndrome    Cirrhosis of liver (HCC)    CKD (chronic kidney disease) stage 4, GFR 15-29 ml/min (HCC) 11/04/2020   Cutaneous horn    Drug-induced parkinsonism ([place])    patient unaware   Esophageal varices ([place])    Fibromyalgia    patient denies   Gastric ulcer    GERD (gastroesophageal reflux disease)    Gingival abscess 08/26/2016   Glaucoma 2003   pt unaware   Heart attack ([place])    Heart disease    Hepatitis, autoimmune ([place]) 08/25/2014   History of iron deficiency anemia    Hypotension    Hypothyroidism    Migraines    Neutropenia (HCC)    Osteoporosis    Pacemaker  Medtronic    MRI compatible   Paranoid schizophrenia ([place])    Polyarthritis    Polyosteoarthritis    Rectal mass    Seizures ([place])    last sz 08/21/14   Sinus arrest    Skin cancer    forehead   SSS (sick sinus syndrome) ([place])    Stroke ([place])    Thrombocytopenia ([place])    Vertigo    bvvp    Past Surgical History:  Procedure Laterality Date   BIOPSY  03/06/2019   Procedure: BIOPSY;  Surgeon: Thornton Park, MD;  Location: [facility];  Service: Gastroenterology;;  EGD and COLON   BIOPSY  04/15/2020   Procedure: BIOPSY;  Surgeon: Thornton Park, MD;  Location: [facility];  Service: Gastroenterology;;   BIOPSY  01/13/2021   Procedure: BIOPSY;  Surgeon: Sharyn Creamer, MD;  Location: [facility];  Service: Gastroenterology;;   CATARACT EXTRACTION Left 2008   pt unaware   CHOLECYSTECTOMY     COLONOSCOPY WITH PROPOFOL N/A 03/06/2019   Procedure: COLONOSCOPY WITH PROPOFOL;  Surgeon: Thornton Park, MD;  Location: [facility];  Service: Gastroenterology;  Laterality: N/A;   ESOPHAGOGASTRODUODENOSCOPY N/A 01/13/2021   Procedure: ESOPHAGOGASTRODUODENOSCOPY (EGD);  Surgeon: Sharyn Creamer, MD;  Location: [facility];  Service: Gastroenterology;  Laterality: N/A;   ESOPHAGOGASTRODUODENOSCOPY (EGD) WITH PROPOFOL N/A 03/06/2019   Procedure: ESOPHAGOGASTRODUODENOSCOPY (EGD) WITH PROPOFOL ;  Surgeon: Thornton Park, MD;  Location: [facility];  Service: Gastroenterology;  Laterality: N/A;   ESOPHAGOGASTRODUODENOSCOPY (EGD) WITH PROPOFOL N/A 04/15/2020   Procedure: ESOPHAGOGASTRODUODENOSCOPY (EGD) WITH PROPOFOL;  Surgeon: Thornton Park, MD;  Location: [facility];  Service: Gastroenterology;  Laterality: N/A;   FLEXIBLE SIGMOIDOSCOPY N/A 01/13/2021   Procedure: FLEXIBLE SIGMOIDOSCOPY;  Surgeon: Sharyn Creamer, MD;  Location: [facility];  Service: Gastroenterology;  Laterality: N/A;   KNEE SURGERY Left 1985   PACEMAKER INSERTION  2014   PARTIAL HYSTERECTOMY  1979   PARTIAL  PROCTECTOMY BY TEM N/A 05/10/2019   Procedure: TEM PARTIAL PROCTECTOMY OF RECTAL MASS WITH EXCISION RIGHT PERINEAL SKIN MASS;  Surgeon: Michael Boston, MD;  Location: [facility];  Service: General;  Laterality: N/A;   POLYPECTOMY  03/06/2019   Procedure: POLYPECTOMY;  Surgeon: Thornton Park, MD;  Location: [facility];  Service: Gastroenterology;;   [place]       reports that she quit smoking about 17 years ago. Her smoking use included cigarettes. She has never used smokeless tobacco. She reports that she does not drink alcohol and does not use drugs.  Allergies  Allergen Reactions   Amitriptyline Other (See Comments)    Stops heart per pt Syncopal episodes    Cortisone Other (See Comments)   Hydrocodone-Acetaminophen Anaphylaxis and Other (See Comments)    Seizures   Lidocaine Other (See Comments)    Seizures    Metoclopramide Other (See Comments)    Pt does not remember reaction     Penicillins Anaphylaxis and Other (See Comments)    Stops heart per pt     Diazepam Other (See Comments)    Stops heart per pt     Diflunisal Swelling and Other (See Comments)    Stops heart per pt (Dolobid) Stops heart per pt (Dolobid)    Misc. Throat Products Other (See Comments)    PT states that she is allergic to OTC  Flu, cold, sinus, allergy, gas, and GERD meds   Neomycin Nausea And Vomiting   Oxycodone Rash and Other (See Comments)    Hallucinations     Oxycodone-Acetaminophen Rash   Propoxyphene Other (See Comments)    Increased heart rate per pt  Increased heart rate per pt Slowed heart rate per pt (reaction to Darvocet)    Ranitidine Other (See Comments)    Pt does not remember reaction GERD worsens Pt does not remember reaction    Ambien [Zolpidem Tartrate] Other (See Comments)    Hallucinations    Ampicillin Other (See Comments)    Stops heart per pt    Anaprox [Naproxen Sodium] Other (See Comments)    Doesn't work per pt    Cetirizine & Related Other (See Comments)    Swelling    Cortizone-10 [Hydrocortisone] Other (See Comments)    seizures    Diltiazem    Flexeril [Cyclobenzaprine] Other (See Comments)    Seizures    Lactose Intolerance (Gi) Swelling and Other (See Comments)    cramping   Meperidine And Related Other (See Comments)    Stops heart rate (reaction to Demerol)   Metanx [L-Methylfolate-Algae-B12-B6] Other (See Comments)    Pt does not remember reaction   Morphine Other (See Comments)   Morphine And Related Other (See Comments)    Stops heart per pt    Propoxyphene Other (See Comments)    Slowed heart rate per pt (reaction to Darvocet)   Vicodin [Hydrocodone-Acetaminophen] Other (See Comments)    Seizures    Acetaminophen Rash and Other (See Comments)  Butenafine Hcl Rash   Duloxetine Other (See Comments)    Reaction to Cymbalta - pt doesn't remember what the reaction was Reaction to Cymbalta - pt doesn't remember what the reaction was    Ibuprofen Other (See Comments)    Caused headache Caused headache    Tramadol Other (See Comments)    Family History  Problem Relation Age of Onset   Heart attack Father    Heart disease Paternal Uncle    Heart disease Paternal Grandmother    Diabetes Paternal Grandmother    Heart disease Paternal Grandfather    Breast cancer Mother    Cancer Maternal Grandmother    CVA Maternal Grandmother    Cancer Maternal Grandfather    CVA Maternal Grandfather    Diabetes Sister    Diabetes Brother    Diabetes Brother    Stroke Daughter     Prior to Admission medications   Medication Sig Start Date End Date Taking? Authorizing Provider  acetaminophen (TYLENOL) 325 MG tablet Take 650 mg by mouth every 8 (eight) hours as needed for mild pain.   Yes [provider]  docusate sodium (COLACE) 100 MG capsule Take 100 mg by mouth daily.   Yes [provider]  famotidine (PEPCID) 20 MG tablet Take 1 tablet (20 mg total) by mouth 2  (two) times daily. 12/12/20  Yes Thornton Park, MD  fentaNYL (DURAGESIC) 25 MCG/HR Place 1 patch onto the skin every 3 (three) days. 01/30/21  Yes [provider]  levothyroxine (SYNTHROID) 50 MCG tablet Take 1 tablet (50 mcg total) by mouth daily. 12/17/20  Yes Libby Maw, MD  midodrine (PROAMATINE) 5 MG tablet Take 1 tablet (5 mg total) by mouth 2 (two) times daily with a meal. 01/23/21 02/22/21 Yes Kayleen Memos, DO  Nutritional Supplements (NUTRITIONAL SHAKE PLUS PROTEIN PO) Take 30 mLs by mouth 3 (three) times daily between meals.   Yes [provider]  polyethylene glycol (MIRALAX / GLYCOLAX) 17 g packet Take 17 g by mouth daily.   Yes [provider]  predniSONE (DELTASONE) 20 MG tablet Take 1 tablet (20 mg total) by mouth daily with breakfast. 01/24/21 02/23/21 Yes Kayleen Memos, DO  Probiotic Product (DIGESTIVE ADV DIGESTIVE/IMMUNE) CHEW Chew 2 capsules by mouth at bedtime.   Yes [provider]  Skin Protectants, Misc. (DRY SKIN EX) Apply 1 application topically daily. Apply to both elbows, knee caps, and feet   Yes [provider]  spironolactone (ALDACTONE) 25 MG tablet Take 12.5 mg by mouth daily. 01/28/21  Yes [provider]  tamsulosin (FLOMAX) 0.4 MG CAPS capsule Take 0.4 mg by mouth at bedtime. 07/16/19  Yes [provider]  naloxone (NARCAN) nasal spray 4 mg/0.1 mL Place 1 spray into the nose as needed. Patient not taking: Reported on 02/02/2021 01/30/21   [provider]    Physical Exam: Constitutional: Moderately built and nourished. Vitals:   02/15/21 2300 02/16/21 0000 02/16/21 0017 02/16/21 0100  BP: 115/78 135/86  115/76  Pulse: 60 70  63  Resp: (!) 22 (!) 22  15  Temp:      TempSrc:      SpO2: 98% 100%  100%  Weight:   89.8 kg   Height:       Eyes: Anicteric no pallor. ENMT: No discharge from the ears eyes nose and mouth. Neck: No mass felt.  No neck rigidity. Respiratory: No  rhonchi or crepitations. Cardiovascular: S1-S2 heard. Abdomen: Distended nontender bowel sound  present. Musculoskeletal: Bilateral lower extremity edema extending up to the thighs. Skin: Chronic skin changes. Neurologic: Alert awake oriented time place and person.  Moves all extremities. Psychiatric: Appears normal.  Normal affect.   Labs on Admission: I have personally reviewed following labs and imaging studies  CBC: Recent Labs  Lab 02/15/21 2347  WBC 2.5*  NEUTROABS 2.0  HGB 10.4*  HCT 30.9*  MCV 93.4  PLT 66*   Basic Metabolic Panel: Recent Labs  Lab 02/15/21 2347  NA 132*  K 4.0  CL 105  CO2 23  GLUCOSE 111*  BUN 35*  CREATININE 0.96  CALCIUM 7.9*   GFR: Estimated Creatinine Clearance: 59.5 mL/min (by C-G formula based on SCr of 0.96 mg/dL). Liver Function Tests: Recent Labs  Lab 02/15/21 2347  AST 24  ALT 24  ALKPHOS 59  BILITOT 1.0  PROT 5.1*  ALBUMIN 2.5*   Recent Labs  Lab 02/15/21 2347  LIPASE 33   No results for input(s): AMMONIA in the last 168 hours. Coagulation Profile: No results for input(s): INR, PROTIME in the last 168 hours. Cardiac Enzymes: No results for input(s): CKTOTAL, CKMB, CKMBINDEX, TROPONINI in the last 168 hours. BNP (last 3 results) No results for input(s): PROBNP in the last 8760 hours. HbA1C: No results for input(s): HGBA1C in the last 72 hours. CBG: No results for input(s): GLUCAP in the last 168 hours. Lipid Profile: No results for input(s): CHOL, HDL, LDLCALC, TRIG, CHOLHDL, LDLDIRECT in the last 72 hours. Thyroid Function Tests: No results for input(s): TSH, T4TOTAL, FREET4, T3FREE, THYROIDAB in the last 72 hours. Anemia Panel: No results for input(s): VITAMINB12, FOLATE, FERRITIN, TIBC, IRON, RETICCTPCT in the last 72 hours. Urine analysis:    Component Value Date/Time   COLORURINE AMBER (A) 01/12/2021 1509   APPEARANCEUR HAZY (A) 01/12/2021 1509   LABSPEC 1.019 01/12/2021 1509   PHURINE 5.0 01/12/2021  1509   GLUCOSEU NEGATIVE 01/12/2021 1509   GLUCOSEU NEGATIVE 11/14/2020 1041   HGBUR SMALL (A) 01/12/2021 1509   BILIRUBINUR NEGATIVE 01/12/2021 1509   KETONESUR NEGATIVE 01/12/2021 1509   PROTEINUR NEGATIVE 01/12/2021 1509   UROBILINOGEN 0.2 11/14/2020 1041   NITRITE NEGATIVE 01/12/2021 1509   LEUKOCYTESUR NEGATIVE 01/12/2021 1509   Sepsis Labs: @LABRCNTIP (procalcitonin:4,lacticidven:4) )No results found for this or any previous visit (from the past 240 hour(s)).   Radiological Exams on Admission: CT CHEST ABDOMEN PELVIS WO CONTRAST  Result Date: 02/15/2021 CLINICAL DATA:  Abdominal pain, ascites.  History of cirrhosis. EXAM: CT CHEST, ABDOMEN AND PELVIS WITHOUT CONTRAST TECHNIQUE: Multidetector CT imaging of the chest, abdomen and pelvis was performed following the standard protocol without IV contrast. RADIATION DOSE REDUCTION: This exam was performed according to the departmental dose-optimization program which includes automated exposure control, adjustment of the mA and/or kV according to patient size and/or use of iterative reconstruction technique. COMPARISON:  01/12/2021. FINDINGS: CT CHEST FINDINGS Cardiovascular: The heart is normal in size and there is a trace pericardial effusion. Pacemaker leads are present within the heart. Scattered coronary artery calcifications are noted. There is atherosclerotic calcification of the aorta without evidence of aneurysm. The pulmonary trunk is normal in caliber. Mediastinum/Nodes: No mediastinal or axillary lymphadenopathy. Evaluation of the hila is limited due to lack of IV contrast. The thyroid gland is not visualized on exam. The trachea and esophagus are within normal limits. Lungs/Pleura: Atelectasis is present at the left lung base. Scattered 2-3 mm pulmonary nodules are noted bilaterally. No pneumothorax. Musculoskeletal: There is asymmetric enlargement of the left breast  with significant fat stranding and subareolar edema which continues  into the lateral chest wall. Degenerative changes are present in the thoracic spine. No acute fracture. CT ABDOMEN PELVIS FINDINGS Hepatobiliary: The liver small and has a nodular contour, compatible with underlying cirrhosis. The gallbladder is surgically absent. No biliary ductal dilatation. Pancreas: Pancreatic atrophy is noted. No pancreatic ductal dilatation or surrounding inflammatory changes. Spleen: The spleen is enlarged measuring 15.1 cm in length. Adrenals/Urinary Tract: The adrenal glands are within normal limits. A cyst is noted in the mid left kidney. A hyperdense lesion is noted in the lower pole of the left kidney measuring 1 cm, corresponding to cyst on prior exam. No renal calculus or hydronephrosis. The bladder is within normal limits. Stomach/Bowel: The stomach is within normal limits. No bowel obstruction, free air, or pneumatosis. Evaluation of the bowel is limited due to ascites. Scattered diverticula are present along the colon without evidence of diverticulitis. No dilated bowel loops. The appendix is not visualized on exam. Vascular/Lymphatic: Varices are noted in the left upper quadrant. There is atherosclerotic calcification of the aorta without evidence of aneurysm. No abdominopelvic lymphadenopathy. Reproductive: Status post hysterectomy. No adnexal masses. Other: Large ascites is noted. Musculoskeletal: Degenerative changes in the lumbar spine and left hip. No acute osseous abnormality. Anasarca is present. IMPRESSION: 1. Large ascites. 2. Morphologic changes of cirrhosis and portal hypertension. 3. Anasarca. There is asymmetric enlargement of the left breast with subcutaneous fat stranding and edema extending into the left chest wall, which may be due to ascites. Correlation with physical exam is recommended. 4. No acute process in the chest. 5. Coronary artery calcifications. 6. Aortic atherosclerosis. 7. Remaining chronic findings as described above. Electronically Signed   By:  Brett Fairy M.D.   On: 02/15/2021 23:37    EKG: Independently reviewed.  Normal sinus rhythm low voltage.  Assessment/Plan Principal Problem:   Decompensated hepatic cirrhosis (HCC) Active Problems:   Bipolar 1 disorder (HCC)   Pacemaker Medtronic   Hypothyroidism   Autoimmune hepatitis ([place])   Ascites    Decompensated liver cirrhosis with anasarca and ascites -patient has significantly large volume ascites for which I have ordered albumin and ultrasound-guided paracentesis.  For now we will keep empirically on antibiotic for SBP coverage.  We will consult patient's gastroenterologist Dr. Modena Nunnery of [facility].  Patient will likely will need diuretics.  For now continue spironolactone.  Continue prednisone.  Not sure if she has been taking it for last few weeks.  Check INR. Pancytopenia likely from cirrhosis.  Recently patient's her immunosuppressants were discontinued due to this.  Follow CBC closely. Hypothyroidism on Synthroid. Recent admission patient had hepatorenal syndrome. Chronic pain on fentanyl patch.  Since patient has large volume ascites with anasarca will need close monitoring for any further worsening inpatient status.   DVT prophylaxis: SCDs.  Avoiding anticoagulation in the setting of thrombocytopenia. Code Status: DNR. Family Communication: Patient's daughter at the bedside. Disposition Plan: To be determined. Consults called: [facility] and palliative care. Admission status: Inpatient.   Rise Patience MD [facility] Pager [phone].  If 7PM-7AM, please contact night-coverage [email or web address] Password [email or web address]  02/16/2021, 4:00 AM

## 2021-02-16 NOTE — Progress Notes (Incomplete)
Echocardiogram 2D Echocardiogram has been performed.  Stacey Velazquez 02/16/2021, 2:29 PM

## 2021-02-16 NOTE — Procedures (Addendum)
Ultrasound-guided diagnostic and therapeutic paracentesis performed yielding 5 liters (maximum ordered) of clear, yellow fluid. No immediate complications. A portion of the fluid was sent to the lab for preordered studies. EBL none.

## 2021-02-16 NOTE — Hospital Course (Addendum)
Stacey Velazquez is a 76 year old female with PMH autoimmune hepatitis (dx 2006) cirrhosis with ascites, portal HTN, esophageal varices, depression/anxiety, fibromyalgia, HTN, SSS s/p pacer, CAD, CVA, CKD, GERD, PUD who presented to the hospital with recurrent abdominal pain and distension. She also has worsening edema in her legs up to her thighs.  She was recently hospitalized 12/19 - 12/30 for decompensated cirrhosis as well.  She underwent repeat paracentesis at that time and also treatment with steroids. She was again admitted for repeat paracentesis and ongoing treatment for decompensated hepatic cirrhosis. Paracentesis performed on 1/23 removed 5L fluid. Cell count negative for evidence of SBP.  GOC discussed with her daughter, Stacey Velazquez, and the patient; ultimately they elected for transitioning to hospice level care. She was accepted to [name] at discharge.

## 2021-02-16 NOTE — Progress Notes (Signed)
°  Progress Note    LASHAI GROSCH   VFI:[id number]  DOB: 10-09-1945  DOA: 02/15/2021     0 PCP: Libby Maw, MD  Initial CC: abdominal pain/distension, LE edema  Hospital Course: Ms. Stacey Velazquez is a 76 year old female with PMH autoimmune hepatitis (dx 2006) cirrhosis with ascites, portal HTN, esophageal varices, depression/anxiety, fibromyalgia, HTN, SSS s/p pacer, CAD, CVA, CKD, GERD, PUD who presented to the hospital with recurrent abdominal pain and distension. She also has worsening edema in her legs up to her thighs.  She was recently hospitalized 12/19 - 12/30 for decompensated cirrhosis as well.  She underwent repeat paracentesis at that time and also treatment with steroids. She was again admitted for repeat paracentesis and ongoing treatment for decompensated hepatic cirrhosis.  Interval History:  Resting in bed this morning.  Lethargic but no distress.  Complaining of ongoing abdominal distention with some associated soreness/pain.  Also worsening edema in her legs and thighs.  Assessment & Plan:  Autoimmune hepatitis ([place]) Esophageal varices  Decompensated hepatic cirrhosis with ascites Coagulopathy due to Cirrhosis  - repeat paracentesis ordered - appears diuretics may have been stopped last admission - GI consulted for assistance with management   Acute on chronic blood loss anemia  Anemia associated with chemotherapy  Chronic blood loss anemia  - Hgb stable   Pancytopenia ([place])- (present on admission) - chronic; due to cirrhosis   Hypothyroidism- (present on admission) -Continue levothyroxine   Hyponatremia- (present on admission) -Continue Fluid restriction    Old records reviewed in assessment of this patient  Antimicrobials: Cipro 1/23 >> current  DVT prophylaxis: SCD  Code Status:   Code Status: DNR  Disposition Plan:   Status is: Inpt  Objective: Blood pressure 117/87, pulse 68, temperature 97.7 F (36.5 C), temperature source Oral,  resp. rate 20, height 5\' 9"  (1.753 m), weight 92.3 kg, SpO2 98 %.  Examination:  Physical Exam Constitutional:      Comments: Pleasant elderly woman lying in bed in no distress but fatigued appearing  HENT:     Head: Normocephalic and atraumatic.     Mouth/Throat:     Mouth: Mucous membranes are moist.  Eyes:     Extraocular Movements: Extraocular movements intact.  Cardiovascular:     Rate and Rhythm: Normal rate and regular rhythm.  Pulmonary:     Effort: Pulmonary effort is normal.     Breath sounds: Normal breath sounds.  Abdominal:     Comments: Significantly distended with generalized abdominal tenderness.  Bowel sounds distant but present  Musculoskeletal:     Cervical back: Normal range of motion and neck supple.     Comments: Severe 3+ bilateral lower extremity pitting edema up to upper thighs  Neurological:     General: No focal deficit present.  Psychiatric:        Mood and Affect: Mood normal.        Behavior: Behavior normal.     Consultants:  GI  Procedures:    Data Reviewed: I have personally reviewed labs and imaging studies    LOS: 0 days  Time spent: Greater than 50% of the 35 minute visit was spent in counseling/coordination of care for the patient as laid out in the A&P.   Dwyane Dee, MD [facility] 02/16/2021, 12:33 PM

## 2021-02-16 NOTE — ED Provider Notes (Signed)
[facility] DEPT Provider Note   CSN: [id number] Arrival date & time: 02/15/21  2117     History  Chief Complaint  Patient presents with   Abdominal Pain    Stacey Velazquez is a 76 y.o. female.  HPI 76 year old female with a extensive medical history including bipolar 1 disorder, hypothyroidism, sinus arrest and [employer] pacemaker, seizure disorder, drug-induced Parkinson's disease, GERD, autoimmune hepatitis, paranoid schizophrenia, decompensated liver cirrhosis, esophageal varices, portal hypertension sent to the ER with daughter with complaints of abdominal pain.  History provided largely by daughter at bedside.  Patient was admitted to the hospital 12/19 for melena and hematochezia.  Patient's daughter states that she had large-volume paracentesis done on 12/20 and 12/21.  She was discharged on [place].  She is followed by Dr. Tarri Glenn with gastroenterology who has been trying to arrange an outpatient paracentesis.  Patient's daughter states that she has been visiting her in the facility every day and the patient has been very weak and deconditioned.  She is complained of overall generalized pain, including her abdomen.  She has significant lower extremity edema which the patient states that she has had since discharge from the hospital.  Daughter has noticed that she has been more sleepy over the last several days.  Patient denies any nausea, vomiting, melena, hematochezia.  She states she does endorse polyuria and states at times she is incontinent of urine.    Home Medications Prior to Admission medications   Medication Sig Start Date End Date Taking? Authorizing Provider  docusate sodium (COLACE) 100 MG capsule Take 100 mg by mouth daily.    [provider]  famotidine (PEPCID) 20 MG tablet Take 1 tablet (20 mg total) by mouth 2 (two) times daily. 12/12/20   Thornton Park, MD  fentaNYL (DURAGESIC) 12 MCG/HR Place 1 patch onto the  skin every 3 (three) days. 01/30/21   [provider]  levothyroxine (SYNTHROID) 50 MCG tablet Take 1 tablet (50 mcg total) by mouth daily. 12/17/20   Libby Maw, MD  midodrine (PROAMATINE) 5 MG tablet Take 1 tablet (5 mg total) by mouth 2 (two) times daily with a meal. 01/23/21 02/22/21  Kayleen Memos, DO  naloxone [facility]) nasal spray 4 mg/0.1 mL Place 1 spray into the nose as needed. Patient not taking: Reported on 02/02/2021 01/30/21   [provider]  Nutritional Supplements (NUTRITIONAL SHAKE PLUS PROTEIN PO) Take 30 mLs by mouth 3 (three) times daily between meals.    [provider]  pantoprazole (PROTONIX) 40 MG tablet Take 1 tablet (40 mg total) by mouth 2 (two) times daily for 20 days. 01/23/21 02/12/21  Kayleen Memos, DO  polyethylene glycol (MIRALAX / GLYCOLAX) 17 g packet Take 8.5 g by mouth daily as needed for severe constipation.    [provider]  predniSONE (DELTASONE) 20 MG tablet Take 1 tablet (20 mg total) by mouth daily with breakfast. 01/24/21 02/23/21  Kayleen Memos, DO  Probiotic Product (DIGESTIVE ADV DIGESTIVE/IMMUNE) CHEW Chew 2 capsules by mouth at bedtime.    [provider]  Skin Protectants, Misc. (DRY SKIN EX) Apply 1 application topically daily. Apply to both elbows, knee caps, and feet    [provider]  spironolactone (ALDACTONE) 25 MG tablet Take 12.5 mg by mouth daily. 01/28/21   [provider]  tamsulosin (FLOMAX) 0.4 MG CAPS capsule Take 0.4 mg by mouth at bedtime. 07/16/19   [provider]  Allergies    Amitriptyline, Cortisone, Hydrocodone-acetaminophen, Lidocaine, Metoclopramide, Penicillins, Diazepam, Diflunisal, Misc. throat products, Naproxen, Neomycin, Oxycodone, Oxycodone-acetaminophen, Propoxyphene, Ranitidine, Ambien [zolpidem tartrate], Ampicillin, Anaprox [naproxen sodium], Benadryl [diphenhydramine hcl (sleep)], Cetirizine, Cetirizine & related, Cortizone-10  [hydrocortisone], Diltiazem, Diphenhydramine, Diphenhydramine hcl, Flexeril [cyclobenzaprine], Hydrocodone, Lactose intolerance (gi), Meperidine, Meperidine and related, Metanx [l-methylfolate-algae-b12-b6], Morphine, Morphine and related, Propoxyphene, Vicodin [hydrocodone-acetaminophen], Acetaminophen, Butenafine hcl, Duloxetine, Ibuprofen, Tramadol, and Zolpidem    Review of Systems   Review of Systems Ten systems reviewed and are negative for acute change, except as noted in the HPI.   Physical Exam Updated Vital Signs BP 115/76    Pulse 63    Temp 98.6 F (37 C) (Oral)    Resp 15    Ht 5\' 9"  (1.753 m)    Wt 89.8 kg    SpO2 100%    BMI 29.24 kg/m  Physical Exam Vitals and nursing note reviewed.  Constitutional:      General: She is not in acute distress.    Appearance: She is well-developed. She is ill-appearing.     Comments: Chronically ill-appearing  HENT:     Head: Normocephalic and atraumatic.  Eyes:     Conjunctiva/sclera: Conjunctivae normal.  Cardiovascular:     Rate and Rhythm: Normal rate and regular rhythm.     Heart sounds: No murmur heard.    Comments: 3+ pitting edema to lower extremities bilaterally Pulmonary:     Effort: Pulmonary effort is normal. No respiratory distress.     Breath sounds: Normal breath sounds.     Comments: Slightly tachypneic with pursed lip breathing, however lung sounds clear Abdominal:     Palpations: Abdomen is soft.     Tenderness: There is generalized abdominal tenderness.     Comments: Abdomen is significantly distended and firm with generalized tenderness.  No significant peritoneal signs.  No overlying skin changes.  Musculoskeletal:        General: No swelling.     Cervical back: Neck supple.     Right lower leg: 3+ Edema present.     Left lower leg: 3+ Edema present.  Skin:    General: Skin is warm and dry.     Capillary Refill: Capillary refill takes less than 2 seconds.  Neurological:     General: No focal deficit  present.     Mental Status: She is alert and oriented to person, place, and time.  Psychiatric:        Mood and Affect: Mood normal.    ED Results / Procedures / Treatments   Labs (all labs ordered are listed, but only abnormal results are displayed) Labs Reviewed  CBC WITH DIFFERENTIAL/PLATELET - Abnormal; Notable for the following components:      Result Value   WBC 2.5 (*)    RBC 3.31 (*)    Hemoglobin 10.4 (*)    HCT 30.9 (*)    RDW 19.5 (*)    Platelets 66 (*)    Lymphs Abs 0.4 (*)    All other components within normal limits  COMPREHENSIVE METABOLIC PANEL - Abnormal; Notable for the following components:   Sodium 132 (*)    Glucose, Bld 111 (*)    BUN 35 (*)    Calcium 7.9 (*)    Total Protein 5.1 (*)    Albumin 2.5 (*)    Anion gap 4 (*)    All other components within normal limits  LIPASE, BLOOD  BRAIN NATRIURETIC PEPTIDE  URINALYSIS, ROUTINE W REFLEX MICROSCOPIC  EKG None  Radiology CT CHEST ABDOMEN PELVIS WO CONTRAST  Result Date: 02/15/2021 CLINICAL DATA:  Abdominal pain, ascites.  History of cirrhosis. EXAM: CT CHEST, ABDOMEN AND PELVIS WITHOUT CONTRAST TECHNIQUE: Multidetector CT imaging of the chest, abdomen and pelvis was performed following the standard protocol without IV contrast. RADIATION DOSE REDUCTION: This exam was performed according to the departmental dose-optimization program which includes automated exposure control, adjustment of the mA and/or kV according to patient size and/or use of iterative reconstruction technique. COMPARISON:  01/12/2021. FINDINGS: CT CHEST FINDINGS Cardiovascular: The heart is normal in size and there is a trace pericardial effusion. Pacemaker leads are present within the heart. Scattered coronary artery calcifications are noted. There is atherosclerotic calcification of the aorta without evidence of aneurysm. The pulmonary trunk is normal in caliber. Mediastinum/Nodes: No mediastinal or axillary lymphadenopathy.  Evaluation of the hila is limited due to lack of IV contrast. The thyroid gland is not visualized on exam. The trachea and esophagus are within normal limits. Lungs/Pleura: Atelectasis is present at the left lung base. Scattered 2-3 mm pulmonary nodules are noted bilaterally. No pneumothorax. Musculoskeletal: There is asymmetric enlargement of the left breast with significant fat stranding and subareolar edema which continues into the lateral chest wall. Degenerative changes are present in the thoracic spine. No acute fracture. CT ABDOMEN PELVIS FINDINGS Hepatobiliary: The liver small and has a nodular contour, compatible with underlying cirrhosis. The gallbladder is surgically absent. No biliary ductal dilatation. Pancreas: Pancreatic atrophy is noted. No pancreatic ductal dilatation or surrounding inflammatory changes. Spleen: The spleen is enlarged measuring 15.1 cm in length. Adrenals/Urinary Tract: The adrenal glands are within normal limits. A cyst is noted in the mid left kidney. A hyperdense lesion is noted in the lower pole of the left kidney measuring 1 cm, corresponding to cyst on prior exam. No renal calculus or hydronephrosis. The bladder is within normal limits. Stomach/Bowel: The stomach is within normal limits. No bowel obstruction, free air, or pneumatosis. Evaluation of the bowel is limited due to ascites. Scattered diverticula are present along the colon without evidence of diverticulitis. No dilated bowel loops. The appendix is not visualized on exam. Vascular/Lymphatic: Varices are noted in the left upper quadrant. There is atherosclerotic calcification of the aorta without evidence of aneurysm. No abdominopelvic lymphadenopathy. Reproductive: Status post hysterectomy. No adnexal masses. Other: Large ascites is noted. Musculoskeletal: Degenerative changes in the lumbar spine and left hip. No acute osseous abnormality. Anasarca is present. IMPRESSION: 1. Large ascites. 2. Morphologic changes of  cirrhosis and portal hypertension. 3. Anasarca. There is asymmetric enlargement of the left breast with subcutaneous fat stranding and edema extending into the left chest wall, which may be due to ascites. Correlation with physical exam is recommended. 4. No acute process in the chest. 5. Coronary artery calcifications. 6. Aortic atherosclerosis. 7. Remaining chronic findings as described above. Electronically Signed   By: Brett Fairy M.D.   On: 02/15/2021 23:37    Procedures Procedures    Medications Ordered in ED Medications - No data to display  ED Course/ Medical Decision Making/ A&P                           This patient presents to the ED for concern of ascites, abdominal pain, lower extremity edema this involves an extensive number of treatment options, and is a complaint that carries with it a high risk of complications and morbidity.  The differential diagnosis includes large volume  ascites, SBP, cholecystitis, appendicitis, pancreatitis, HF exacerbation, pulmonary edema/hydrothorax    Co morbidities that complicate the patient evaluation  Autoimmune hepatitis, hypothyroidism, cirrhosis of liver with ascites, drug-induced Parkinson's, CKD, fibromyalgia, sick sinus syndrome, stroke, paranoid schizophrenia   Additional history obtained:  Additional history obtained from daughter at bedside, chart review   Lab Tests:  I Ordered, and personally interpreted labs.  The pertinent results include: CBC with leukopenia of 2.5, hemoglobin of 10.4 which appears to be stable.  Thrombocytopenia with a platelet count of 66 which also appears to be more or less at baseline.  BNP normal.  CMP with mild hyponatremia 132, comparatively at baseline relative to prior labs.  Potassium normal, creatinine of 0.96, anion gap of 4 likely secondary to hemoconcentration and dehydration.  LFTs normal.  Albumin of 2.5.   Imaging Studies ordered:  I ordered imaging studies including CT chest abdomen  pelvis without contrast I independently visualized and interpreted imaging which showed large volume ascites and anasarca.  No evidence of hydrothorax or acute process in the chest I agree with the radiologist interpretation   Cardiac Monitoring:  The patient was maintained on a cardiac monitor.  I personally viewed and interpreted the cardiac monitored which showed an underlying rhythm of: Normal sinus rhythm, regular   Medicines ordered and prescription drug management:  Patient complains of pain however has an extensive Velazquez of allergies and adverse reactions including "heart stops" to most opioid medications.  He has a Velazquez of intolerance to ibuprofen listed as "headache" and a rash associated with Tylenol.  She is currently receiving a fentanyl patch outpatient but her daughter states that this is not sufficient. I have reviewed the patients home medicines and have made adjustments as needed   Test Considered:  -Considered diagnostic paracentesis however lower suspicion for SBP at this time given no fever, no leukocytosis, and exam w/ no peritoneal signs    Consultations Obtained:  Will need IR consultation in the AM per hospitalist team    Problem Velazquez / ED Course:  Patient presents with large volume ascites and complaints of generalized pain.  Her lab work overall is reassuring, CT scan confirms large volume ascites.  She would benefit from a large-volume paracentesis and albumin administration.  Patient is very weak and deconditioned and also needs pain control.  Plan for admission with engagement with IR in the morning for large-volume paracentesis and close observation.  She will also need GI consult, followed by Dr. Tarri Glenn.   Reevaluation:  After the interventions noted above, I reevaluated the patient and found that they have :stayed the same   Dispostion:  After consideration of the diagnostic results and the patients response to treatment, I feel that the patent  would benefit from admission for large volume paracentesis, albumin administration and pain control. Consulted  Dr. Hal Hope w/ the hospitalist team who will admit the patient for further evaluation and treatment   This was a shared visit with my supervising physician Dr. Sedonia Small who independently saw and evaluated the patient & provided guidance in evaluation/management/disposition ,in agreement with care     Final Clinical Impression(s) / ED Diagnoses Final diagnoses:  Other ascites    Rx / DC Orders ED Discharge Orders     None         Lyndel Safe 02/16/21 0222    Maudie Flakes, MD 02/16/21 [phone]

## 2021-02-16 NOTE — Progress Notes (Signed)
Manufacturing engineer [name] Hospital Liaison Note  Received request from [name] for family interest in [name]. Visited patient at bedside and spoke with [name] to confirm interest and explain services.  Approval for [name] is determined by [name]. Once [facility] MD has determined [name] eligibility, [name] will update hospital staff and family.  Please do not hesitate to call with any hospice related questions.    Thank you for the opportunity to participate in this patient's care.  [name], MSW [facility] Hospital Liaison  [phone]

## 2021-02-16 NOTE — Consult Note (Addendum)
Referring Provider: Dr. Dwyane Dee Primary Care Physician:  Libby Maw, MD Primary Gastroenterologist:  Dr. Thornton Park  Reason for Consultation: Decompensated cirrhosis  HPI: Stacey Velazquez is a 76 y.o. female  with a past medical history of anxiety, depression, fibromyalgia, hypertension, sick sinus syndrome s/p pacemaker, coronary artery disease s/p MI, CVA, CKD, GERD, gastric ulcer 02/2019, tubular adenomatous and tubulovillous colon polyps, constipation, pancreatic tail cystic lesion, autoimmune hepatitis (initially diagnosed in 2006) with cirrhosis, esophageal varices, portal hypertensive and pancytopenia.    She was admitted to the hospital 01/12/2021 due to decompensated cirrhosis with abdominal pain, rectal bleeding and melena.  She received PPI IV, Octreotide and IV antibiotic for SBP prophylaxis. Admission hemoglobin was 10.1 which dropped to 7.8 -> transfused 1 unit PRBCs. CTAP wo contrast showed cirrhosis with marked ascites. S/P paracentesis LVP x 2. No SBP. Liver Doppler without evidence of a PVT.  She required IV albumin. She had AKI/CKD, seen by nephrology who felt she did not have HRS with low urine Na+ level. EGD 01/13/2021 showed nonbleeding grade 2 esophageal varices, portal hypertensive gastropathy and gastritis.  A flex sig showed stool in the rectum, left colon and transverse colon with diverticulosis and multiple nonbleeding polyps were also seen in the sigmoid, descending and transverse colon which were not removed due to concerns of GI bleeding.  She did not demonstrate any active GI bleeding therefore Octreotide was discontinued. She had neutropenia for which she received Granix.  She was off Tacrolimus about 2 months due to kidney dysfunction.  She was not a candidate for Azathioprine or MMF secondary to her pancytopenia.  Her neutropenia resolved, she was placed on low-dose Prednisone for her autoimmune hepatitis on 12/23.  Her clinical status stabilized  and she was discharged to SNF on 01/23/2021.  She was not prescribed diuretics at the time of discharge. Her MELD score was 27 at time of discharge.  She is not a liver transplant candidate as previously verified by Roosevelt Locks NP with [facility]. She is followed by palliative care, not ready for Hospice.   She was seen by Dr. Tarri Glenn in our GI clinic 02/02/2021.  At that time, her IgG levels were improving on Prednisone.  Dr. Tarri Glenn considered weekly IV albumin and IV Lasix infusions as an outpatient. She was seen by Roosevelt Locks NP 02/04/2021 to discuss alternative autoimmune hepatitis treatments.  She was advised to continue Prednisone 20 mg daily with a slow titration over the next 6 months and a high-protein and 2 g low-sodium diet was recommended.  A large-volume paracentesis was recommended.  Restarting diuretics if her kidney function allowed was also considered.  Her MELD NA score was 16 at that time.  She presented to [facility] ED by EMS on 02/15/2021 with worsening abdominal pain and distention.  Labs in the ED showed a WBC count of 2.5.  Hemoglobin 10.4.  HCT 30.9.  Platelets 66.  Sodium 132.  BUN 35.  Creatinine 0.96.  Total bili 1.0.  Alk phos 59.  AST 24.  ALT 24.  Chest/abdominal/pelvic CT showed anasarca with large volume ascites.  COVID-19 negative.  INR pending.  She endorses having worsening abdominal distention and generalized abdominal pain over the past few weeks.  She has mild nausea.  No vomiting.  She is passing bowel movement most days but she does not look at the stool and is unaware if she has any black stools or rectal bleeding.  She has occasional  chest pressure.  No chest pain or chest pressure at this time.  She has shortness of breath which has worsened with her noted abdominal distention.  No cough or hemoptysis.  She is compliant with the medications as administered by the SNF.  No family at the bedside at this time.  Chest/abdomen/pelvic CT  without contrast 02/15/2021: 1. Large ascites. 2. Morphologic changes of cirrhosis and portal hypertension. 3. Anasarca. There is asymmetric enlargement of the left breast with subcutaneous fat stranding and edema extending into the left chest wall, which may be due to ascites. Correlation with physical exam is recommended. 4. No acute process in the chest. 5. Coronary artery calcifications. 6. Aortic atherosclerosis. 7. Remaining chronic findings as described above  PAST GI PROCEDURES: 1. Large ascites. 2. Morphologic changes of cirrhosis and portal hypertension. 3. Anasarca. There is asymmetric enlargement of the left breast with subcutaneous fat stranding and edema extending into the left chest wall, which may be due to ascites. Correlation with physical exam is recommended. 4. No acute process in the chest. 5. Coronary artery calcifications. 6. Aortic atherosclerosis. 7. Remaining chronic findings as described above.  EGD 01/13/2021: - Grade II esophageal varices. - Portal hypertensive gastropathy. - Gastritis. Biopsied. - Normal examined duodenum.  Flexible sigmoidoscopy 01/13/2021: - Stool in the rectum, in the sigmoid colon, in the descending colon and in the transverse colon. - Diverticulosis in the sigmoid colon, in the descending colon and in the transverse colon. - A tattoo was seen in the transverse colon. - Multiple, non-bleeding polyps in the sigmoid colon, in the descending colon and in the transverse colon. Not removed due to presentation for bleeding. - Non-bleeding internal hemorrhoids. - No specimens collected.   EGD 03/06/19: Grade II varices were found in the middle third of the esophagus. Moderate portal hypertensive gastropathy. Gastritis. One non-bleeding cratered gastric ulcer with no stigmata of bleeding was found on the lesser curvature of the stomach. There was no H pylori.   Colonoscopy with Dr. Benson Norway 02/10/2016 showed 6 polyps.  4 polyps ranged from  3 to 6 mm were removed and the descending: And transverse colon.  There were 2 polyps ranging from 30 to 40 mm that were biopsied and tattooed in the ascending colon and transverse.  Pathology report showed tubulous villous adenoma of high-grade dysplasia.  The patient was referred to [facility]    Colonoscopy with Dr. Stephanie Acre at [facility] 05/26/16: "The perianal and digital rectal examinations were normal. A greater than 50 mm polyp was found in the proximal ascending colon. The polyp was sessile. Preparations were made for mucosal resection. Saline with methylene blue was injected to raise the lesion. Snare mucosal resection was performed. Resection and retrieval were complete. Coagulation for tissue destruction using snare was successful. A 50 mm polyp was found in the hepatic flexure. The polyp was semi-pedunculated. Preparations were made for mucosal resection. Saline with methylene blue was injected to raise the lesion. Snare mucosal resection was performed. Resection and retrieval were complete. Coagulation for tissue destruction using snare was successful. The exam was otherwise without abnormality on direct and retroflexion  views." Pathology revealed Tubulovillous adenoma (multiple fragments) with multiple foci of high-grade dysplasia.  Surveillance colonoscopy recommended in 1 year.   Colonoscopy 03/06/19 showed a 20 mm polyp in the distal rectum adjacent to the dentate line. Biopsies were consistent with tubulovillous adenoma with high-grade grandular dysplasia. She also had three 1 to 4 mm tubular adenomas and two hyperplastic polyps removed at that time.  TEM partial proctectomy of bleeding rectal mass with Dr. Johney Maine.  Pathology revealed a 2.6 cm tubulovillous adenoma.  Margins were uninvolved by dysplasia.  EGD 04/15/20 showed 4 columns of grade 2 varices, portal hypertensive gastropathy, gastritis.  Gastric erosion had resolved.   Past Medical History:  Diagnosis Date   Adenomatous polyp of ascending  colon    Anxiety    Bilateral lower extremity edema    burning sensations   Bipolar 1 disorder (HCC)    Carpal tunnel syndrome    Cirrhosis of liver (HCC)    CKD (chronic kidney disease) stage 4, GFR 15-29 ml/min (HCC) 11/04/2020   Cutaneous horn    Drug-induced parkinsonism ([place])    patient unaware   Esophageal varices ([place])    Fibromyalgia    patient denies   Gastric ulcer    GERD (gastroesophageal reflux disease)    Gingival abscess 08/26/2016   Glaucoma 2003   pt unaware   Heart attack ([place])    Heart disease    Hepatitis, autoimmune ([place]) 08/25/2014   History of iron deficiency anemia    Hypotension    Hypothyroidism    Migraines    Neutropenia (HCC)    Osteoporosis    Pacemaker Med[employer]onic    MRI compatible   Paranoid schizophrenia ([place])    Polyarthritis    Polyosteoarthritis    Rectal mass    Seizures ([place])    last sz 08/21/14   Sinus arrest    Skin cancer    forehead   SSS (sick sinus syndrome) ([place])    Stroke ([place])    Thrombocytopenia ([place])    Vertigo    bvvp    Past Surgical History:  Procedure Laterality Date   BIOPSY  03/06/2019   Procedure: BIOPSY;  Surgeon: Thornton Park, MD;  Location: [facility];  Service: Gastroenterology;;  EGD and COLON   BIOPSY  04/15/2020   Procedure: BIOPSY;  Surgeon: Thornton Park, MD;  Location: [facility];  Service: Gastroenterology;;   BIOPSY  01/13/2021   Procedure: BIOPSY;  Surgeon: Sharyn Creamer, MD;  Location: [facility];  Service: Gastroenterology;;   CATARACT EXTRACTION Left 2008   pt unaware   CHOLECYSTECTOMY     COLONOSCOPY WITH PROPOFOL N/A 03/06/2019   Procedure: COLONOSCOPY WITH PROPOFOL;  Surgeon: Thornton Park, MD;  Location: [facility];  Service: Gastroenterology;  Laterality: N/A;   ESOPHAGOGASTRODUODENOSCOPY N/A 01/13/2021   Procedure: ESOPHAGOGASTRODUODENOSCOPY (EGD);  Surgeon: Sharyn Creamer, MD;  Location: [facility];  Service: Gastroenterology;  Laterality: N/A;    ESOPHAGOGASTRODUODENOSCOPY (EGD) WITH PROPOFOL N/A 03/06/2019   Procedure: ESOPHAGOGASTRODUODENOSCOPY (EGD) WITH PROPOFOL ;  Surgeon: Thornton Park, MD;  Location: [facility];  Service: Gastroenterology;  Laterality: N/A;   ESOPHAGOGASTRODUODENOSCOPY (EGD) WITH PROPOFOL N/A 04/15/2020   Procedure: ESOPHAGOGASTRODUODENOSCOPY (EGD) WITH PROPOFOL;  Surgeon: Thornton Park, MD;  Location: [facility];  Service: Gastroenterology;  Laterality: N/A;   FLEXIBLE SIGMOIDOSCOPY N/A 01/13/2021   Procedure: FLEXIBLE SIGMOIDOSCOPY;  Surgeon: Sharyn Creamer, MD;  Location: [facility];  Service: Gastroenterology;  Laterality: N/A;   KNEE SURGERY Left 1985   PACEMAKER INSERTION  2014   PARTIAL HYSTERECTOMY  1979   PARTIAL PROCTECTOMY BY TEM N/A 05/10/2019   Procedure: TEM PARTIAL PROCTECTOMY OF RECTAL MASS WITH EXCISION RIGHT PERINEAL SKIN MASS;  Surgeon: Michael Boston, MD;  Location: [facility];  Service: General;  Laterality: N/A;   POLYPECTOMY  03/06/2019   Procedure: POLYPECTOMY;  Surgeon: Thornton Park, MD;  Location: [facility];  Service: Gastroenterology;;   REFRACTIVE  SURGERY     TUBAL LIGATION      Prior to Admission medications   Medication Sig Start Date End Date Taking? Authorizing Provider  acetaminophen (TYLENOL) 325 MG tablet Take 650 mg by mouth every 8 (eight) hours as needed for mild pain.   Yes [provider]  docusate sodium (COLACE) 100 MG capsule Take 100 mg by mouth daily.   Yes [provider]  famotidine (PEPCID) 20 MG tablet Take 1 tablet (20 mg total) by mouth 2 (two) times daily. 12/12/20  Yes Thornton Park, MD  fentaNYL (DURAGESIC) 25 MCG/HR Place 1 patch onto the skin every 3 (three) days. 01/30/21  Yes [provider]  levothyroxine (SYNTHROID) 50 MCG tablet Take 1 tablet (50 mcg total) by mouth daily. 12/17/20  Yes Libby Maw, MD  midodrine (PROAMATINE) 5 MG tablet Take 1 tablet (5 mg total) by mouth 2 (two) times daily with a  meal. 01/23/21 02/22/21 Yes Kayleen Memos, DO  Nutritional Supplements (NUTRITIONAL SHAKE PLUS PROTEIN PO) Take 30 mLs by mouth 3 (three) times daily between meals.   Yes [provider]  polyethylene glycol (MIRALAX / GLYCOLAX) 17 g packet Take 17 g by mouth daily.   Yes [provider]  predniSONE (DELTASONE) 20 MG tablet Take 1 tablet (20 mg total) by mouth daily with breakfast. 01/24/21 02/23/21 Yes Kayleen Memos, DO  Probiotic Product (DIGESTIVE ADV DIGESTIVE/IMMUNE) CHEW Chew 2 capsules by mouth at bedtime.   Yes [provider]  Skin Protectants, Misc. (DRY SKIN EX) Apply 1 application topically daily. Apply to both elbows, knee caps, and feet   Yes [provider]  spironolactone (ALDACTONE) 25 MG tablet Take 12.5 mg by mouth daily. 01/28/21  Yes [provider]  tamsulosin (FLOMAX) 0.4 MG CAPS capsule Take 0.4 mg by mouth at bedtime. 07/16/19  Yes [provider]  naloxone (NARCAN) nasal spray 4 mg/0.1 mL Place 1 spray into the nose as needed. Patient not taking: Reported on 02/02/2021 01/30/21   [provider]    Current Facility-Administered Medications  Medication Dose Route Frequency Provider Last Rate Last Admin   acidophilus (RISAQUAD) capsule 1 capsule  1 capsule Oral QHS Rise Patience, MD       albumin human 25 % solution 25 g  25 g Intravenous Q6H Rise Patience, MD 60 mL/hr at 02/16/21 0708 25 g at 02/16/21 0708   ciprofloxacin (CIPRO) IVPB 400 mg  400 mg Intravenous Q12H Rise Patience, MD 200 mL/hr at 02/16/21 0556 400 mg at 02/16/21 0556   docusate sodium (COLACE) capsule 100 mg  100 mg Oral Daily Rise Patience, MD   100 mg at 02/16/21 0825   famotidine (PEPCID) tablet 20 mg  20 mg Oral BID Rise Patience, MD   20 mg at 02/16/21 0825   feeding supplement (ENSURE ENLIVE / ENSURE PLUS) liquid 237 mL  237 mL Oral TID BM Rise Patience, MD       fentaNYL (DURAGESIC) 25 MCG/HR 1 patch   1 patch Transdermal Q3 days Rise Patience, MD   1 patch at 02/16/21 0603   levothyroxine (SYNTHROID) tablet 50 mcg  50 mcg Oral Q0600 Rise Patience, MD   50 mcg at 02/16/21 0556   midodrine (PROAMATINE) tablet 5 mg  5 mg Oral BID WC Rise Patience, MD   5 mg at 02/16/21 0825   polyethylene glycol (MIRALAX / GLYCOLAX) packet 17 g  17 g Oral  Daily Rise Patience, MD       predniSONE (DELTASONE) tablet 20 mg  20 mg Oral Q breakfast Rise Patience, MD   20 mg at 02/16/21 0825   spironolactone (ALDACTONE) tablet 12.5 mg  12.5 mg Oral Daily Rise Patience, MD   12.5 mg at 02/16/21 0825   tamsulosin (FLOMAX) capsule 0.4 mg  0.4 mg Oral QHS Rise Patience, MD        Allergies as of 02/15/2021 - Review Complete 02/15/2021  Allergen Reaction Noted   Amitriptyline Other (See Comments) 09/05/2012   Cortisone Other (See Comments) 09/05/2012   Hydrocodone-acetaminophen Anaphylaxis and Other (See Comments) 09/05/2012   Lidocaine Other (See Comments) 09/05/2012   Metoclopramide Other (See Comments) 09/05/2012   Penicillins Other (See Comments) and Anaphylaxis 09/05/2012   Diazepam Other (See Comments) 09/14/2012   Diflunisal Swelling and Other (See Comments) 09/05/2012   Misc. throat products Other (See Comments) 09/05/2012   Naproxen Other (See Comments) and Swelling 09/05/2012   Neomycin Nausea And Vomiting 05/10/2019   Oxycodone Other (See Comments) 09/05/2012   Oxycodone-acetaminophen Rash 09/05/2012   Propoxyphene Other (See Comments) 09/05/2012   Ranitidine Other (See Comments) 09/05/2012   Ambien [zolpidem tartrate] Other (See Comments) 02/21/2013   Ampicillin Other (See Comments) 02/21/2013   Anaprox [naproxen sodium] Other (See Comments) 02/21/2013   Benadryl [diphenhydramine hcl (sleep)] Other (See Comments) 02/21/2013   Cetirizine Other (See Comments) 03/27/2019   Cetirizine & related Other (See Comments) 02/21/2013   Cortizone-10  [hydrocortisone] Other (See Comments) 02/21/2013   Diltiazem  03/27/2019   Diphenhydramine Other (See Comments) 03/27/2019   Diphenhydramine hcl  03/27/2019   Flexeril [cyclobenzaprine] Other (See Comments) 02/21/2013   Hydrocodone Other (See Comments) 03/27/2019   Lactose intolerance (gi) Swelling and Other (See Comments) 08/23/2014   Meperidine Other (See Comments) 03/27/2019   Meperidine and related Other (See Comments) 02/21/2013   Metanx [l-methylfolate-algae-b12-b6] Other (See Comments) 02/21/2013   Morphine Other (See Comments) 03/27/2019   Morphine and related Other (See Comments) 02/21/2013   Propoxyphene Other (See Comments) 02/21/2013   Vicodin [hydrocodone-acetaminophen] Other (See Comments) 02/21/2013   Acetaminophen Rash and Other (See Comments) 09/05/2012   Butenafine hcl Rash 01/12/2021   Duloxetine Other (See Comments) 09/05/2012   Ibuprofen Other (See Comments) 09/05/2012   Tramadol Other (See Comments) 05/17/2017   Zolpidem Other (See Comments) and Rash 09/05/2012    Family History  Problem Relation Age of Onset   Heart attack Father    Heart disease Paternal Uncle    Heart disease Paternal Grandmother    Diabetes Paternal Grandmother    Heart disease Paternal Grandfather    Breast cancer Mother    Cancer Maternal Grandmother    CVA Maternal Grandmother    Cancer Maternal Grandfather    CVA Maternal Grandfather    Diabetes Sister    Diabetes Brother    Diabetes Brother    Stroke Daughter     Social History   Socioeconomic History   Marital status: Widowed    Spouse name: Not on file   Number of children: Not on file   Years of education: Not on file   Highest education level: Not on file  Occupational History   Occupation: disable  Tobacco Use   Smoking status: Former    Types: Cigarettes    Quit date: 03/02/2003    Years since quitting: 17.9   Smokeless tobacco: Never  Vaping Use   Vaping Use: Never used  Substance and Sexual Activity  Alcohol use: No   Drug use: No   Sexual activity: Not on file  Other Topics Concern   Not on file  Social History Narrative   Lives alone, "have friends and relatives that come and help me"   caffeine use- coffee -1 cup daily   Social Determinants of Health   Financial Resource Strain: Not on file  Food Insecurity: Not on file  Transportation Needs: Not on file  Physical Activity: Not on file  Stress: Not on file  Social Connections: Not on file  Intimate Partner Violence: Not on file   Review of Systems: Gen: Denies fever, sweats or chills.  She is unsure if she is lost any weight. CV: + Leg swelling. Resp: + SOB.  No cough or hemoptysis. GI: See HPI. GU : Denies urinary burning, blood in urine, increased urinary frequency or incontinence. MS: Denies joint pain, muscles aches or weakness. Derm: Denies rash, itchiness, skin lesions or unhealing ulcers. Psych: + Mild memory issues. Heme: Denies easy bruising, bleeding. Neuro:  Denies headaches, dizziness or paresthesias. Endo:  Denies any problems with DM, thyroid or adrenal function.  Physical Exam: Vital signs in last 24 hours: Temp:  [97.7 F (36.5 C)-98.6 F (37 C)] 97.7 F (36.5 C) (01/23 0550) Pulse Rate:  [60-78] 68 (01/23 0550) Resp:  [15-23] 20 (01/23 0550) BP: (115-135)/(76-87) 117/87 (01/23 0550) SpO2:  [97 %-100 %] 98 % (01/23 0550) Weight:  [89.8 kg-92.3 kg] 92.3 kg (01/23 0500) Last BM Date: 02/15/21 General: Alert 76 year old female in no acute distress. Head:  Normocephalic and atraumatic. Eyes:  No scleral icterus. Conjunctiva pink. Ears:  Normal auditory acuity. Nose:  No deformity, discharge or lesions. Mouth: Poor dentition.  No ulcers or lesions.  Neck:  Supple. No lymphadenopathy or thyromegaly.  Lungs: Breath sounds clear throughout. Heart: Regular rate and rhythm, no murmurs. Abdomen: Beese abdomen with anasarca + ascites.  Upper abdomen protrudes with moderate tenderness RLQ > LUQ out  rebound or guarding.  Upper abdomen tympanic, central lower abdomen dull to percussion. Rectal: Deferred. Musculoskeletal:  Symmetrical without gross deformities.  Pulses:  Normal pulses noted. Extremities: Bilateral lower extremities with 2+ pitting edema Neurologic:  Alert and  oriented x 4. No focal deficits.  No asterixis Skin:  Intact without significant lesions or rashes. Psych:  Alert and cooperative. Normal mood and affect.  Intake/Output from previous day: 01/22 0701 - 01/23 0700 In: 182.7 [IV Piggyback:182.7] Out: -  Intake/Output this shift: No intake/output data recorded.  Lab Results: Recent Labs    02/15/21 2347 02/16/21 0725  WBC 2.5* 2.4*  HGB 10.4* 10.1*  HCT 30.9* 29.8*  PLT 66* 61*   BMET Recent Labs    02/15/21 2347 02/16/21 0725  NA 132* 129*  K 4.0 3.5  CL 105 103  CO2 23 21*  GLUCOSE 111* 90  BUN 35* 33*  CREATININE 0.96 0.90  CALCIUM 7.9* 7.8*   LFT Recent Labs    02/16/21 0725  PROT 4.8*  ALBUMIN 2.3*  AST 23  ALT 24  ALKPHOS 55  BILITOT 0.9   PT/INR No results for input(s): LABPROT, INR in the last 72 hours. Hepatitis Panel No results for input(s): HEPBSAG, HCVAB, HEPAIGM, HEPBIGM in the last 72 hours.    Studies/Results: CT CHEST ABDOMEN PELVIS WO CONTRAST  Result Date: 02/15/2021 CLINICAL DATA:  Abdominal pain, ascites.  History of cirrhosis. EXAM: CT CHEST, ABDOMEN AND PELVIS WITHOUT CONTRAST TECHNIQUE: Multidetector CT imaging of the chest, abdomen and pelvis was performed  following the standard protocol without IV contrast. RADIATION DOSE REDUCTION: This exam was performed according to the departmental dose-optimization program which includes automated exposure control, adjustment of the mA and/or kV according to patient size and/or use of iterative reconstruction technique. COMPARISON:  01/12/2021. FINDINGS: CT CHEST FINDINGS Cardiovascular: The heart is normal in size and there is a trace pericardial effusion. Pacemaker  leads are present within the heart. Scattered coronary artery calcifications are noted. There is atherosclerotic calcification of the aorta without evidence of aneurysm. The pulmonary trunk is normal in caliber. Mediastinum/Nodes: No mediastinal or axillary lymphadenopathy. Evaluation of the hila is limited due to lack of IV contrast. The thyroid gland is not visualized on exam. The trachea and esophagus are within normal limits. Lungs/Pleura: Atelectasis is present at the left lung base. Scattered 2-3 mm pulmonary nodules are noted bilaterally. No pneumothorax. Musculoskeletal: There is asymmetric enlargement of the left breast with significant fat stranding and subareolar edema which continues into the lateral chest wall. Degenerative changes are present in the thoracic spine. No acute fracture. CT ABDOMEN PELVIS FINDINGS Hepatobiliary: The liver small and has a nodular contour, compatible with underlying cirrhosis. The gallbladder is surgically absent. No biliary ductal dilatation. Pancreas: Pancreatic atrophy is noted. No pancreatic ductal dilatation or surrounding inflammatory changes. Spleen: The spleen is enlarged measuring 15.1 cm in length. Adrenals/Urinary Tract: The adrenal glands are within normal limits. A cyst is noted in the mid left kidney. A hyperdense lesion is noted in the lower pole of the left kidney measuring 1 cm, corresponding to cyst on prior exam. No renal calculus or hydronephrosis. The bladder is within normal limits. Stomach/Bowel: The stomach is within normal limits. No bowel obstruction, free air, or pneumatosis. Evaluation of the bowel is limited due to ascites. Scattered diverticula are present along the colon without evidence of diverticulitis. No dilated bowel loops. The appendix is not visualized on exam. Vascular/Lymphatic: Varices are noted in the left upper quadrant. There is atherosclerotic calcification of the aorta without evidence of aneurysm. No abdominopelvic  lymphadenopathy. Reproductive: Status post hysterectomy. No adnexal masses. Other: Large ascites is noted. Musculoskeletal: Degenerative changes in the lumbar spine and left hip. No acute osseous abnormality. Anasarca is present. IMPRESSION: 1. Large ascites. 2. Morphologic changes of cirrhosis and portal hypertension. 3. Anasarca. There is asymmetric enlargement of the left breast with subcutaneous fat stranding and edema extending into the left chest wall, which may be due to ascites. Correlation with physical exam is recommended. 4. No acute process in the chest. 5. Coronary artery calcifications. 6. Aortic atherosclerosis. 7. Remaining chronic findings as described above. Electronically Signed   By: Brett Fairy M.D.   On: 02/15/2021 23:37    IMPRESSION/PLAN:  22) 76 year old female with autoimmune hepatitis, decompensated cirrhosis with esophageal varices, portal hypertensive gastropathy, ascites admitted to the hospital with generalized abdominal pain and worsening ascites. CTAP without contrast shows a large volume of ascites and anasarca.  No overt signs of hepatic encephalopathy. Normal LFTs.  INR pending.  Patient is not a liver transplant candidate.  Followed by palliative care. -INR, pending -Paracentesis ordered by the hospitalist, peritoneal fluid labs to include cell count with differential, gram stain, albumin, protein, aerobic and anaerobic cultures. -IV albumin 25 g IV every 6 hours -Continue Cipro 400 mg IV twice daily for prophylaxis -Continue Prednisone 20 mg daily -Continue Famotidine 20 mg p.o. twice daily -Continue Midodrine 5 mg twice daily -Continue Spironolactone 12.5 mg daily, add Furosemide if renal status stable post paracentesis -Monitor  renal status closely -BMP, INR,  IgG level and ammonia level in a.m. -2 g low-sodium diet -Recommend nutritionist consult, protein 1.5gm/kg/day -Recommend ECHO to rule out right sided heart failure  -Await further recommendations per  Dr. Edison Nasuti  2) Pancytopenia, secondary to cirrhosis  -CBC in am  3) History of adenomatous and tubulovillous colon polyps. S/P flex sig  01/13/2021 showed stool in the rectum, left colon and transverse colon with diverticulosis and multiple nonbleeding polyps were also seen in the sigmoid, descending and transverse colon which were not removed due to concerns of GI bleeding.   -Follow up colonoscopy as an outpatient   ADDENDUM: ECHO  today showed LV EF 60 -65%. Normal RV function. There is mild asymmetric left ventricular hypertrophy of the basal and septal segments. Left ventricular diastolic parameters are consistent with Grade I diastolic dysfunction.   Noralyn Pick  02/16/2021, 10:13AM   _______________________________________________________________________________________________________________________________________  Velora Heckler GI MD note:  I personally examined the patient, reviewed the data and agree with the assessment and plan described above.  She has decompensated AIH related cirrhosis. MELD-Na today is 19.  She is overall doing poorly and it seems that edema, ascites is her biggest issue leading to this admission. LVP with appropriate fluid testing is already ordered. I am going to put her on appropriate diuretics for now (aldactone 52m once daily, lasix 237monce daily) to start.   DaOwens LofflerMD [facility] Pager [phone]

## 2021-02-17 LAB — CBC
HCT: 26.7 % — ABNORMAL LOW (ref 36.0–46.0)
Hemoglobin: 9.1 g/dL — ABNORMAL LOW (ref 12.0–15.0)
MCH: 32.6 pg (ref 26.0–34.0)
MCHC: 34.1 g/dL (ref 30.0–36.0)
MCV: 95.7 fL (ref 80.0–100.0)
Platelets: 61 10*3/uL — ABNORMAL LOW (ref 150–400)
RBC: 2.79 MIL/uL — ABNORMAL LOW (ref 3.87–5.11)
RDW: 19.7 % — ABNORMAL HIGH (ref 11.5–15.5)
WBC: 1.1 10*3/uL — CL (ref 4.0–10.5)
nRBC: 0 % (ref 0.0–0.2)

## 2021-02-17 LAB — BASIC METABOLIC PANEL
Anion gap: 4 — ABNORMAL LOW (ref 5–15)
BUN: 29 mg/dL — ABNORMAL HIGH (ref 8–23)
CO2: 22 mmol/L (ref 22–32)
Calcium: 8 mg/dL — ABNORMAL LOW (ref 8.9–10.3)
Chloride: 103 mmol/L (ref 98–111)
Creatinine, Ser: 0.99 mg/dL (ref 0.44–1.00)
GFR, Estimated: 59 mL/min — ABNORMAL LOW (ref >60)
Glucose, Bld: 94 mg/dL (ref 70–99)
Potassium: 3.6 mmol/L (ref 3.5–5.1)
Sodium: 129 mmol/L — ABNORMAL LOW (ref 135–145)

## 2021-02-17 LAB — MAGNESIUM: Magnesium: 1.9 mg/dL (ref 1.7–2.4)

## 2021-02-17 LAB — PROTIME-INR
INR: 2.3 — ABNORMAL HIGH (ref 0.8–1.2)
Prothrombin Time: 25.7 seconds — ABNORMAL HIGH (ref 11.4–15.2)

## 2021-02-17 LAB — AMMONIA: Ammonia: 36 umol/L — ABNORMAL HIGH (ref 9–35)

## 2021-02-17 MED ORDER — SPIRONOLACTONE 50 MG PO TABS: 50.0000 mg | ORAL_TABLET | Freq: Every day | ORAL | Status: AC

## 2021-02-17 MED ORDER — MIDODRINE HCL 5 MG PO TABS
5.0000 mg | ORAL_TABLET | Freq: Two times a day (BID) | ORAL | Status: AC
Start: 2021-02-17 — End: ?

## 2021-02-17 MED ORDER — FUROSEMIDE 20 MG PO TABS: 20.0000 mg | ORAL_TABLET | Freq: Every day | ORAL | Status: AC

## 2021-02-17 MED ORDER — MELATONIN 3 MG PO TABS: 6.0000 mg | ORAL_TABLET | Freq: Every day | ORAL | 0 refills | Status: AC

## 2021-02-17 NOTE — TOC Transition Note (Signed)
Transition of Care [facility]) - CM/SW Discharge Note   Patient Details  Name: Stacey Velazquez MRN: [id number] Date of Birth: 13-Apr-1945  [facility] CM/SW Contact:  Lynnell Catalan, RN Phone Number: 02/17/2021, 12:34 PM   Clinical Narrative:     Pt to dc to [facility] today. [facility] contacted for transport. Yellow DNR on the chart. RN to call report to [phone]

## 2021-02-17 NOTE — Progress Notes (Addendum)
Palliative care consult received.  Chart reviewed and discussed with [facility] Liaison who engaged with family yesterday.  Plan is for transition to [facility].  As goals clear, will hold on formal consult.  Micheline Rough, MD [facility] Team [phone]  NO CHARGE NOTE

## 2021-02-17 NOTE — Progress Notes (Signed)
She is being transitioned to comfort care, going to [facility].  I think keeping her on lasix and aldactone is reasonable for palliation of edema, ascites. There was no sign of SBP in the fluid that was removed yesterday.   Please call or page with any further questions or concerns.

## 2021-02-17 NOTE — Discharge Summary (Signed)
Physician Discharge Summary   Stacey Velazquez NKN:[id number] DOB: 02-07-1945 DOA: 02/15/2021  PCP: Libby Maw, Velazquez  Admit date: 02/15/2021 Discharge date:  02/17/2021   Admitted From: Home Disposition:  [place] Discharging physician: Dwyane Dee, Velazquez  Recommendations for Outpatient Follow-up:  Continue comfort care  Discharge Condition: poor CODE STATUS: DNR Diet recommendation:  Diet Orders (From admission, onward)     Start     Ordered   02/17/21 0000  Diet - low sodium heart healthy        02/17/21 1147   02/16/21 0358  Diet 2 gram sodium Room service appropriate? Yes; Fluid consistency: Thin  Diet effective now       Question Answer Comment  Room service appropriate? Yes   Fluid consistency: Thin      02/16/21 0359            Hospital Course: Stacey Velazquez is a 76 year old female with PMH autoimmune hepatitis (dx 2006) cirrhosis with ascites, portal HTN, esophageal varices, depression/anxiety, fibromyalgia, HTN, SSS s/p pacer, CAD, CVA, CKD, GERD, PUD who presented to the hospital with recurrent abdominal pain and distension. She also has worsening edema in her legs up to her thighs.  She was recently hospitalized 12/19 - 12/30 for decompensated cirrhosis as well.  She underwent repeat paracentesis at that time and also treatment with steroids. She was again admitted for repeat paracentesis and ongoing treatment for decompensated hepatic cirrhosis. Paracentesis performed on 1/23 removed 5L fluid. Cell count negative for evidence of SBP.  GOC discussed with her daughter, Stacey Velazquez, and the patient; ultimately they elected for transitioning to hospice level care. She was accepted to [name] at discharge.   Autoimmune hepatitis ([place]) Esophageal varices  Decompensated hepatic cirrhosis with ascites Coagulopathy due to Cirrhosis  - s/p paracentesis on 1/23, removed 5 L - continue lasix and spironolactone for comfort purposes as well - discharging to [facility] on hospice   Acute on chronic blood loss anemia  Anemia associated with chemotherapy  Chronic blood loss anemia  - Hgb stable   Pancytopenia ([place])- (present on admission) - chronic; due to cirrhosis   Hypothyroidism- (present on admission) -Continue levothyroxine   Hyponatremia- (present on admission) -2 g Na restricted diet, but okay to liberalize in setting of comfort care if patient wishes too   Principal Diagnosis: Decompensated hepatic cirrhosis ([place])  Discharge Diagnoses: Principal Problem:   Decompensated hepatic cirrhosis ([place]) Active Problems:   Autoimmune hepatitis ([place])   Ascites   Bipolar 1 disorder ([place])   Pacemaker [employer]   Hypothyroidism   Discharge Instructions     Diet - low sodium heart healthy   Complete by: As directed    No wound care   Complete by: As directed       Allergies as of 02/17/2021       Reactions   Amitriptyline Other (See Comments)   Stops heart per pt Syncopal episodes   Cortisone Other (See Comments)   Hydrocodone-acetaminophen Anaphylaxis, Other (See Comments)   Seizures   Lidocaine Other (See Comments)   Seizures   Metoclopramide Other (See Comments)   Pt does not remember reaction   Penicillins Anaphylaxis, Other (See Comments)   Stops heart per pt   Diazepam Other (See Comments)   Stops heart per pt   Diflunisal Swelling, Other (See Comments)   Stops heart per pt (Dolobid) Stops heart per pt (Dolobid)   Misc. Throat Products Other (See Comments)   PT states that she is  allergic to OTC  Flu, cold, sinus, allergy, gas, and GERD meds   Neomycin Nausea And Vomiting   Oxycodone Rash, Other (See Comments)   Hallucinations   Oxycodone-acetaminophen Rash   Propoxyphene Other (See Comments)   Increased heart rate per pt Increased heart rate per pt Slowed heart rate per pt (reaction to Darvocet)   Ranitidine Other (See Comments)   Pt does not remember reaction GERD worsens Pt does not remember reaction    Ambien [zolpidem Tartrate] Other (See Comments)   Hallucinations   Ampicillin Other (See Comments)   Stops heart per pt   Anaprox [naproxen Sodium] Other (See Comments)   Doesn't work per pt   Cetirizine & Related Other (See Comments)   Swelling   Cortizone-10 [hydrocortisone] Other (See Comments)   seizures   Diltiazem    Flexeril [cyclobenzaprine] Other (See Comments)   Seizures   Lactose Intolerance (gi) Swelling, Other (See Comments)   cramping   Meperidine And Related Other (See Comments)   Stops heart rate (reaction to Demerol)   Metanx [l-methylfolate-algae-b12-b6] Other (See Comments)   Pt does not remember reaction   Morphine Other (See Comments)   Morphine And Related Other (See Comments)   Stops heart per pt   Propoxyphene Other (See Comments)   Slowed heart rate per pt (reaction to Darvocet)   Vicodin [hydrocodone-acetaminophen] Other (See Comments)   Seizures   Acetaminophen Rash, Other (See Comments)   Butenafine Hcl Rash   Duloxetine Other (See Comments)   Reaction to Cymbalta - pt doesn't remember what the reaction was Reaction to Cymbalta - pt doesn't remember what the reaction was   Ibuprofen Other (See Comments)   Caused headache Caused headache   Tramadol Other (See Comments)        Medication List     STOP taking these medications    Digestive Adv Digestive/Immune Chew   famotidine 20 MG tablet Commonly known as: PEPCID   naloxone 4 MG/0.1ML Liqd nasal spray kit Commonly known as: NARCAN   predniSONE 20 MG tablet Commonly known as: DELTASONE       TAKE these medications    acetaminophen 325 MG tablet Commonly known as: TYLENOL Take 650 mg by mouth every 8 (eight) hours as needed for mild pain.   docusate sodium 100 MG capsule Commonly known as: COLACE Take 100 mg by mouth daily.   DRY SKIN EX Apply 1 application topically daily. Apply to both elbows, knee caps, and feet   fentaNYL 25 MCG/HR Commonly known as:  [place] 1 patch onto the skin every 3 (three) days.   furosemide 20 MG tablet Commonly known as: LASIX Take 1 tablet (20 mg total) by mouth daily. Start taking on: February 18, 2021   levothyroxine 50 MCG tablet Commonly known as: SYNTHROID Take 1 tablet (50 mcg total) by mouth daily.   melatonin 3 MG Tabs tablet Take 2 tablets (6 mg total) by mouth at bedtime.   midodrine 5 MG tablet Commonly known as: PROAMATINE Take 1 tablet (5 mg total) by mouth 2 (two) times daily with a meal.   NUTRITIONAL SHAKE PLUS PROTEIN PO Take 30 mLs by mouth 3 (three) times daily between meals.   polyethylene glycol 17 g packet Commonly known as: MIRALAX / GLYCOLAX Take 17 g by mouth daily.   spironolactone 50 MG tablet Commonly known as: ALDACTONE Take 1 tablet (50 mg total) by mouth daily. Start taking on: February 18, 2021 What changed:  medication strength how much  to take   tamsulosin 0.4 MG Caps capsule Commonly known as: FLOMAX Take 0.4 mg by mouth at bedtime.        Allergies  Allergen Reactions   Amitriptyline Other (See Comments)    Stops heart per pt Syncopal episodes    Cortisone Other (See Comments)   Hydrocodone-Acetaminophen Anaphylaxis and Other (See Comments)    Seizures   Lidocaine Other (See Comments)    Seizures    Metoclopramide Other (See Comments)    Pt does not remember reaction     Penicillins Anaphylaxis and Other (See Comments)    Stops heart per pt     Diazepam Other (See Comments)    Stops heart per pt     Diflunisal Swelling and Other (See Comments)    Stops heart per pt (Dolobid) Stops heart per pt (Dolobid)    Misc. Throat Products Other (See Comments)    PT states that she is allergic to OTC  Flu, cold, sinus, allergy, gas, and GERD meds   Neomycin Nausea And Vomiting   Oxycodone Rash and Other (See Comments)    Hallucinations     Oxycodone-Acetaminophen Rash   Propoxyphene Other (See Comments)    Increased heart rate  per pt  Increased heart rate per pt Slowed heart rate per pt (reaction to Darvocet)    Ranitidine Other (See Comments)    Pt does not remember reaction GERD worsens Pt does not remember reaction    Ambien [Zolpidem Tartrate] Other (See Comments)    Hallucinations    Ampicillin Other (See Comments)    Stops heart per pt    Anaprox [Naproxen Sodium] Other (See Comments)    Doesn't work per pt   Cetirizine & Related Other (See Comments)    Swelling    Cortizone-10 [Hydrocortisone] Other (See Comments)    seizures    Diltiazem    Flexeril [Cyclobenzaprine] Other (See Comments)    Seizures    Lactose Intolerance (Gi) Swelling and Other (See Comments)    cramping   Meperidine And Related Other (See Comments)    Stops heart rate (reaction to Demerol)   Metanx [L-Methylfolate-Algae-B12-B6] Other (See Comments)    Pt does not remember reaction   Morphine Other (See Comments)   Morphine And Related Other (See Comments)    Stops heart per pt    Propoxyphene Other (See Comments)    Slowed heart rate per pt (reaction to Darvocet)   Vicodin [Hydrocodone-Acetaminophen] Other (See Comments)    Seizures    Acetaminophen Rash and Other (See Comments)   Butenafine Hcl Rash   Duloxetine Other (See Comments)    Reaction to Cymbalta - pt doesn't remember what the reaction was Reaction to Cymbalta - pt doesn't remember what the reaction was    Ibuprofen Other (See Comments)    Caused headache Caused headache    Tramadol Other (See Comments)    Consultations: GI  Discharge Exam: BP 95/75 (BP Location: Left Arm)    Pulse 69    Temp (!) 97.5 F (36.4 C) (Oral)    Resp 18    Ht 5' 9"  (1.753 m)    Wt 90.1 kg    SpO2 100%    BMI 29.33 kg/m  Physical Exam Constitutional:      Comments: Pleasant elderly woman lying in bed in no distress but fatigued appearing  HENT:     Head: Normocephalic and atraumatic.     Mouth/Throat:     Mouth: Mucous membranes are moist.  Eyes:      Extraocular Movements: Extraocular movements intact.  Cardiovascular:     Rate and Rhythm: Normal rate and regular rhythm.  Pulmonary:     Effort: Pulmonary effort is normal.     Breath sounds: Normal breath sounds.  Abdominal:     Comments: Improved distension and tenderness s/p paracentesis; large left side gauze dressing due to fluid leaking from paracentesis  Musculoskeletal:     Cervical back: Normal range of motion and neck supple.     Comments: Severe 3+ bilateral lower extremity pitting edema up to upper thighs  Neurological:     General: No focal deficit present.  Psychiatric:        Mood and Affect: Mood normal.        Behavior: Behavior normal.     The results of significant diagnostics from this hospitalization (including imaging, microbiology, ancillary and laboratory) are listed below for reference.   Microbiology: Recent Results (from the past 240 hour(s))  Resp Panel by RT-PCR (Flu A&B, Covid) Nasopharyngeal Swab     Status: None   Collection Time: 02/16/21  3:35 AM   Specimen: Nasopharyngeal Swab; Nasopharyngeal(NP) swabs in vial transport medium  Result Value Ref Range Status   SARS Coronavirus 2 by RT PCR NEGATIVE NEGATIVE Final    Comment: (NOTE) SARS-CoV-2 target nucleic acids are NOT DETECTED.  The SARS-CoV-2 RNA is generally detectable in upper respiratory specimens during the acute phase of infection. The lowest concentration of SARS-CoV-2 viral copies this assay can detect is 138 copies/mL. A negative result does not preclude SARS-Cov-2 infection and should not be used as the sole basis for treatment or other patient management decisions. A negative result may occur with  improper specimen collection/handling, submission of specimen other than nasopharyngeal swab, presence of viral mutation(s) within the areas targeted by this assay, and inadequate number of viral copies(<138 copies/mL). A negative result must be combined with clinical observations,  patient history, and epidemiological information. The expected result is Negative.  Fact Sheet for Patients:  EntrepreneurPulse.com.au  Fact Sheet for Healthcare Providers:  IncredibleEmployment.be  This test is no t yet approved or cleared by the [place] FDA and  has been authorized for detection and/or diagnosis of SARS-CoV-2 by FDA under an Emergency Use Authorization (EUA). This EUA will remain  in effect (meaning this test can be used) for the duration of the COVID-19 declaration under Section 564(b)(1) of the Act, 21 U.S.C.section 360bbb-3(b)(1), unless the authorization is terminated  or revoked sooner.       Influenza A by PCR NEGATIVE NEGATIVE Final   Influenza B by PCR NEGATIVE NEGATIVE Final    Comment: (NOTE) The Xpert Xpress SARS-CoV-2/FLU/RSV plus assay is intended as an aid in the diagnosis of influenza from Nasopharyngeal swab specimens and should not be used as a sole basis for treatment. Nasal washings and aspirates are unacceptable for Xpert Xpress SARS-CoV-2/FLU/RSV testing.  Fact Sheet for Patients: EntrepreneurPulse.com.au  Fact Sheet for Healthcare Providers: IncredibleEmployment.be  This test is not yet approved or cleared by the [place] FDA and has been authorized for detection and/or diagnosis of SARS-CoV-2 by FDA under an Emergency Use Authorization (EUA). This EUA will remain in effect (meaning this test can be used) for the duration of the COVID-19 declaration under Section 564(b)(1) of the Act, 21 U.S.C. section 360bbb-3(b)(1), unless the authorization is terminated or revoked.  Performed at [facility] [place] [address], [place], [place] [address]   Body fluid culture w Gram  Stain     Status: None (Preliminary result)   Collection Time: 02/16/21  4:26 PM   Specimen: PATH Cytology Peritoneal fluid  Result Value Ref Range Status   Specimen  Description   Final    PERITONEAL Performed at [place] [address] [place], [place] [address]    Special Requests   Final    NONE Performed at [facility], [place] [address], [place], [place] [address]    Gram Stain NO WBC SEEN NO ORGANISMS SEEN   Final   Culture   Final    NO GROWTH < 24 HOURS Performed at [facility], [place] [address], [place], [place] [address]    Report Status PENDING  Incomplete     Labs: BNP (last 3 results) Recent Labs    02/15/21 2347  BNP 38.4   Basic Metabolic Panel: Recent Labs  Lab 02/15/21 2347 02/16/21 0725 02/17/21 0559  NA 132* 129* 129*  K 4.0 3.5 3.6  CL 105 103 103  CO2 23 21* 22  GLUCOSE 111* 90 94  BUN 35* 33* 29*  CREATININE 0.96 0.90 0.99  CALCIUM 7.9* 7.8* 8.0*  MG  --   --  1.9   Liver Function Tests: Recent Labs  Lab 02/15/21 2347 02/16/21 0725  AST 24 23  ALT 24 24  ALKPHOS 59 55  BILITOT 1.0 0.9  PROT 5.1* 4.8*  ALBUMIN 2.5* 2.3*   Recent Labs  Lab 02/15/21 2347  LIPASE 33   Recent Labs  Lab 02/17/21 0559  AMMONIA 36*   CBC: Recent Labs  Lab 02/15/21 2347 02/16/21 0725 02/17/21 0559  WBC 2.5* 2.4* 1.1*  NEUTROABS 2.0  --   --   HGB 10.4* 10.1* 9.1*  HCT 30.9* 29.8* 26.7*  MCV 93.4 95.2 95.7  PLT 66* 61* 61*   Cardiac Enzymes: No results for input(s): CKTOTAL, CKMB, CKMBINDEX, TROPONINI in the last 168 hours. BNP: Invalid input(s): POCBNP CBG: No results for input(s): GLUCAP in the last 168 hours. D-Dimer No results for input(s): DDIMER in the last 72 hours. Hgb A1c No results for input(s): HGBA1C in the last 72 hours. Lipid Profile No results for input(s): CHOL, HDL, LDLCALC, TRIG, CHOLHDL, LDLDIRECT in the last 72 hours. Thyroid function studies No results for input(s): TSH, T4TOTAL, T3FREE, THYROIDAB in the last 72 hours.  Invalid input(s): FREET3 Anemia work up No results for input(s): VITAMINB12, FOLATE, FERRITIN, TIBC, IRON,  RETICCTPCT in the last 72 hours. Urinalysis    Component Value Date/Time   COLORURINE YELLOW 02/16/2021 0516   APPEARANCEUR CLOUDY (A) 02/16/2021 0516   LABSPEC 1.017 02/16/2021 0516   PHURINE 6.0 02/16/2021 0516   GLUCOSEU NEGATIVE 02/16/2021 0516   GLUCOSEU NEGATIVE 11/14/2020 1041   HGBUR NEGATIVE 02/16/2021 0516   BILIRUBINUR NEGATIVE 02/16/2021 0516   KETONESUR NEGATIVE 02/16/2021 0516   PROTEINUR NEGATIVE 02/16/2021 0516   UROBILINOGEN 0.2 11/14/2020 1041   NITRITE NEGATIVE 02/16/2021 0516   LEUKOCYTESUR LARGE (A) 02/16/2021 0516   Sepsis Labs Invalid input(s): PROCALCITONIN,  WBC,  LACTICIDVEN Microbiology Recent Results (from the past 240 hour(s))  Resp Panel by RT-PCR (Flu A&B, Covid) Nasopharyngeal Swab     Status: None   Collection Time: 02/16/21  3:35 AM   Specimen: Nasopharyngeal Swab; Nasopharyngeal(NP) swabs in vial transport medium  Result Value Ref Range Status   SARS Coronavirus 2 by RT PCR NEGATIVE NEGATIVE Final    Comment: (NOTE) SARS-CoV-2 target nucleic acids are NOT DETECTED.  The SARS-CoV-2 RNA is generally detectable in upper respiratory specimens during the acute phase of infection. The lowest concentration of SARS-CoV-2 viral copies this assay can detect is 138 copies/mL. A negative result does not preclude SARS-Cov-2 infection and should not be used as the sole basis for treatment or other patient management decisions. A negative result may occur with  improper specimen collection/handling, submission of specimen other than nasopharyngeal swab, presence of viral mutation(s) within the areas targeted by this assay, and inadequate number of viral copies(<138 copies/mL). A negative result must be combined with clinical observations, patient history, and epidemiological information. The expected result is Negative.  Fact Sheet for Patients:  EntrepreneurPulse.com.au  Fact Sheet for Healthcare Providers:   IncredibleEmployment.be  This test is no t yet approved or cleared by the [place] FDA and  has been authorized for detection and/or diagnosis of SARS-CoV-2 by FDA under an Emergency Use Authorization (EUA). This EUA will remain  in effect (meaning this test can be used) for the duration of the COVID-19 declaration under Section 564(b)(1) of the Act, 21 U.S.C.section 360bbb-3(b)(1), unless the authorization is terminated  or revoked sooner.       Influenza A by PCR NEGATIVE NEGATIVE Final   Influenza B by PCR NEGATIVE NEGATIVE Final    Comment: (NOTE) The Xpert Xpress SARS-CoV-2/FLU/RSV plus assay is intended as an aid in the diagnosis of influenza from Nasopharyngeal swab specimens and should not be used as a sole basis for treatment. Nasal washings and aspirates are unacceptable for Xpert Xpress SARS-CoV-2/FLU/RSV testing.  Fact Sheet for Patients: EntrepreneurPulse.com.au  Fact Sheet for Healthcare Providers: IncredibleEmployment.be  This test is not yet approved or cleared by the [place] FDA and has been authorized for detection and/or diagnosis of SARS-CoV-2 by FDA under an Emergency Use Authorization (EUA). This EUA will remain in effect (meaning this test can be used) for the duration of the COVID-19 declaration under Section 564(b)(1) of the Act, 21 U.S.C. section 360bbb-3(b)(1), unless the authorization is terminated or revoked.  Performed at [facility], [place] [address], [place], [place] [address]   Body fluid culture w Gram Stain     Status: None (Preliminary result)   Collection Time: 02/16/21  4:26 PM   Specimen: PATH Cytology Peritoneal fluid  Result Value Ref Range Status   Specimen Description   Final    PERITONEAL Performed at [place] [address] [place], [place] [address]    Special Requests   Final    NONE Performed at [facility], [place] [address], [place], [place] [address]    Gram Stain NO WBC SEEN NO ORGANISMS SEEN   Final   Culture   Final    NO GROWTH < 24 HOURS Performed at [facility], [place] [address] [place], [place] [address]    Report Status PENDING  Incomplete    Procedures/Studies: US Paracentesis  Result Date: 02/16/2021 INDICATION: Patient with history of autoimmune hepatitis, cirrhosis, esophageal varices, portal hypertension, recurrent ascites. Request received for diagnostic and therapeutic paracentesis up to 5 liters. EXAM: ULTRASOUND GUIDED DIAGNOSTIC AND THERAPEUTIC PARACENTESIS MEDICATIONS: 2 mL 1% tetracaine COMPLICATIONS: None immediate. PROCEDURE: Informed written consent was obtained from the patient after a discussion of the risks, benefits and alternatives to treatment. A timeout was performed prior to the initiation of the procedure. Initial ultrasound scanning demonstrates a large amount of ascites within the left lower abdominal quadrant. The left lower abdomen was prepped and draped  in the usual sterile fashion. 1% lidocaine was used for local anesthesia. Following this, a 19 gauge, 10-cm, Yueh catheter was introduced. An ultrasound image was saved for documentation purposes. The paracentesis was performed. The catheter was removed and a dressing was applied. The patient tolerated the procedure well without immediate post procedural complication. Patient received post-procedure intravenous albumin; see nursing notes for details. FINDINGS: A total of approximately 5 liters of clear, yellow fluid was removed. Samples were sent to the laboratory as requested by the clinical team. IMPRESSION: Successful ultrasound-guided diagnostic and therapeutic paracentesis yielding 5 liters of peritoneal fluid. Read by: Rowe Robert, PA-C Electronically Signed   By: Corrie Mckusick D.O.   On: 02/16/2021 17:08   ECHOCARDIOGRAM COMPLETE  Result Date: 02/16/2021    ECHOCARDIOGRAM REPORT    Patient Name:   Stacey Velazquez Date of Exam: 02/16/2021 Medical Rec #:  [id number]      Height:       69.0 in Accession #:    [id number]     Weight:       203.5 lb Date of Birth:  31-Mar-1945      BSA:          2.081 m Patient Age:    63 years       BP:           117/87 mmHg Patient Gender: F              HR:           80 bpm. Exam Location:  Inpatient Procedure: 2D Echo, 3D Echo and Strain Analysis Indications:    Edema  History:        Patient has prior history of Echocardiogram examinations, most                 recent 01/07/2017.  Sonographer:    Jefferey Pica Referring Phys: [place]  1. Left ventricular ejection fraction, by estimation, is 60 to 65%. The left ventricle has normal function. The left ventricle has no regional wall motion abnormalities. There is mild asymmetric left ventricular hypertrophy of the basal and septal segments. Left ventricular diastolic parameters are consistent with Grade I diastolic dysfunction (impaired relaxation).  2. Pacing leads seen in RV/RA . Right ventricular systolic function is normal. The right ventricular size is normal. There is mildly elevated pulmonary artery systolic pressure.  3. The mitral valve is abnormal. Trivial mitral valve regurgitation. No evidence of mitral stenosis.  4. The aortic valve is tricuspid. There is mild calcification of the aortic valve. There is mild thickening of the aortic valve. Aortic valve regurgitation is not visualized. Aortic valve sclerosis is present, with no evidence of aortic valve stenosis.  5. The inferior vena cava is normal in size with greater than 50% respiratory variability, suggesting right atrial pressure of 3 mmHg. FINDINGS  Left Ventricle: Left ventricular ejection fraction, by estimation, is 60 to 65%. The left ventricle has normal function. The left ventricle has no regional wall motion abnormalities. The left ventricular internal cavity size was normal in size. There is  mild asymmetric left  ventricular hypertrophy of the basal and septal segments. Left ventricular diastolic parameters are consistent with Grade I diastolic dysfunction (impaired relaxation). Right Ventricle: Pacing leads seen in RV/RA. The right ventricular size is normal. No increase in right ventricular wall thickness. Right ventricular systolic function is normal. There is mildly elevated pulmonary artery systolic pressure. The tricuspid regurgitant velocity is 2.56 m/s, and with an  assumed right atrial pressure of 10 mmHg, the estimated right ventricular systolic pressure is 42.7 mmHg. Left Atrium: Left atrial size was normal in size. Right Atrium: Right atrial size was normal in size. Pericardium: There is no evidence of pericardial effusion. Mitral Valve: The mitral valve is abnormal. There is mild thickening of the mitral valve leaflet(s). There is mild calcification of the mitral valve leaflet(s). Trivial mitral valve regurgitation. No evidence of mitral valve stenosis. Tricuspid Valve: The tricuspid valve is normal in structure. Tricuspid valve regurgitation is mild . No evidence of tricuspid stenosis. Aortic Valve: The aortic valve is tricuspid. There is mild calcification of the aortic valve. There is mild thickening of the aortic valve. Aortic valve regurgitation is not visualized. Aortic valve sclerosis is present, with no evidence of aortic valve stenosis. Aortic valve peak gradient measures 8.8 mmHg. Pulmonic Valve: The pulmonic valve was normal in structure. Pulmonic valve regurgitation is not visualized. No evidence of pulmonic stenosis. Aorta: The aortic root is normal in size and structure. Venous: The inferior vena cava is normal in size with greater than 50% respiratory variability, suggesting right atrial pressure of 3 mmHg. IAS/Shunts: No atrial level shunt detected by color flow Doppler. Additional Comments: A device lead is visualized.  LEFT VENTRICLE PLAX 2D LVIDd:         4.50 cm     Diastology LVIDs:          1.90 cm     LV e' medial:    7.29 cm/s LV PW:         1.00 cm     LV E/e' medial:  9.0 LV IVS:        0.90 cm     LV e' lateral:   7.40 cm/s LVOT diam:     2.00 cm     LV E/e' lateral: 8.9 LV SV:         92 LV SV Index:   44          2D Longitudinal Strain LVOT Area:     3.14 cm    2D Strain GLS Avg:     -29.5 %  LV Volumes (MOD) LV vol d, MOD A4C: 83.6 ml LV vol s, MOD A4C: 19.7 ml LV SV MOD A4C:     83.6 ml RIGHT VENTRICLE             IVC RV Basal diam:  2.80 cm     IVC diam: 2.40 cm RV S prime:     20.60 cm/s TAPSE (M-mode): 3.2 cm LEFT ATRIUM             Index        RIGHT ATRIUM           Index LA diam:        3.60 cm 1.73 cm/m   RA Area:     10.60 cm LA Vol (A2C):   33.8 ml 16.24 ml/m  RA Volume:   25.50 ml  12.25 ml/m LA Vol (A4C):   41.8 ml 20.08 ml/m LA Biplane Vol: 37.9 ml 18.21 ml/m  AORTIC VALVE                 PULMONIC VALVE AV Area (Vmax): 2.70 cm     PV Vmax:       1.24 m/s AV Vmax:        148.00 cm/s  PV Peak grad:  6.2 mmHg AV Peak Grad:   8.8 mmHg LVOT Vmax:  127.00 cm/s LVOT Vmean:     81.300 cm/s LVOT VTI:       0.294 m  AORTA Ao Root diam: 3.70 cm Ao Asc diam:  3.20 cm MITRAL VALVE                TRICUSPID VALVE MV Area (PHT): 3.27 cm     TR Peak grad:   26.2 mmHg MV Decel Time: 232 msec     TR Vmax:        256.00 cm/s MV E velocity: 65.50 cm/s MV A velocity: 107.00 cm/s  SHUNTS MV E/A ratio:  0.61         Systemic VTI:  0.29 m                             Systemic Diam: 2.00 cm Stacey Velazquez Electronically signed by Stacey Velazquez Signature Date/Time: 02/16/2021/2:49:26 PM    Final    CT CHEST ABDOMEN PELVIS WO CONTRAST  Result Date: 02/15/2021 CLINICAL DATA:  Abdominal pain, ascites.  History of cirrhosis. EXAM: CT CHEST, ABDOMEN AND PELVIS WITHOUT CONTRAST TECHNIQUE: Multidetector CT imaging of the chest, abdomen and pelvis was performed following the standard protocol without IV contrast. RADIATION DOSE REDUCTION: This exam was performed according to the departmental  dose-optimization program which includes automated exposure control, adjustment of the mA and/or kV according to patient size and/or use of iterative reconstruction technique. COMPARISON:  01/12/2021. FINDINGS: CT CHEST FINDINGS Cardiovascular: The heart is normal in size and there is a trace pericardial effusion. Pacemaker leads are present within the heart. Scattered coronary artery calcifications are noted. There is atherosclerotic calcification of the aorta without evidence of aneurysm. The pulmonary trunk is normal in caliber. Mediastinum/Nodes: No mediastinal or axillary lymphadenopathy. Evaluation of the hila is limited due to lack of IV contrast. The thyroid gland is not visualized on exam. The trachea and esophagus are within normal limits. Lungs/Pleura: Atelectasis is present at the left lung base. Scattered 2-3 mm pulmonary nodules are noted bilaterally. No pneumothorax. Musculoskeletal: There is asymmetric enlargement of the left breast with significant fat stranding and subareolar edema which continues into the lateral chest wall. Degenerative changes are present in the thoracic spine. No acute fracture. CT ABDOMEN PELVIS FINDINGS Hepatobiliary: The liver small and has a nodular contour, compatible with underlying cirrhosis. The gallbladder is surgically absent. No biliary ductal dilatation. Pancreas: Pancreatic atrophy is noted. No pancreatic ductal dilatation or surrounding inflammatory changes. Spleen: The spleen is enlarged measuring 15.1 cm in length. Adrenals/Urinary Tract: The adrenal glands are within normal limits. A cyst is noted in the mid left kidney. A hyperdense lesion is noted in the lower pole of the left kidney measuring 1 cm, corresponding to cyst on prior exam. No renal calculus or hydronephrosis. The bladder is within normal limits. Stomach/Bowel: The stomach is within normal limits. No bowel obstruction, free air, or pneumatosis. Evaluation of the bowel is limited due to ascites.  Scattered diverticula are present along the colon without evidence of diverticulitis. No dilated bowel loops. The appendix is not visualized on exam. Vascular/Lymphatic: Varices are noted in the left upper quadrant. There is atherosclerotic calcification of the aorta without evidence of aneurysm. No abdominopelvic lymphadenopathy. Reproductive: Status post hysterectomy. No adnexal masses. Other: Large ascites is noted. Musculoskeletal: Degenerative changes in the lumbar spine and left hip. No acute osseous abnormality. Anasarca is present. IMPRESSION: 1. Large ascites. 2. Morphologic changes of cirrhosis and portal  hypertension. 3. Anasarca. There is asymmetric enlargement of the left breast with subcutaneous fat stranding and edema extending into the left chest wall, which may be due to ascites. Correlation with physical exam is recommended. 4. No acute process in the chest. 5. Coronary artery calcifications. 6. Aortic atherosclerosis. 7. Remaining chronic findings as described above. Electronically Signed   By: Brett Fairy M.D.   On: 02/15/2021 23:37     Time coordinating discharge: Over 30 minutes    Dwyane Dee, Velazquez  [facility] 02/17/2021, 11:49 AM

## 2021-02-17 NOTE — Progress Notes (Addendum)
Manufacturing engineer [facility]) Hospital Liaison Note  Patient has been approved for [facility]. Consent forms have been completed.  TOC, RN and MD notified of consent completion and TOC arranging transport with [facility].   Please send signed DNR form with patient and RN call report to [phone].    [name], MSW [facility] Liaison [phone]

## 2021-02-18 LAB — PATHOLOGIST SMEAR REVIEW

## 2021-02-18 LAB — IGG: IgG (Immunoglobin G), Serum: 1028 mg/dL (ref 586–1602)

## 2021-02-20 LAB — BODY FLUID CULTURE W GRAM STAIN
Culture: NO GROWTH
Gram Stain: NONE SEEN

## 2021-02-24 ENCOUNTER — Ambulatory Visit: Payer: Medicare Other | Admitting: Hematology and Oncology

## 2021-02-25 DIAGNOSIS — E039 Hypothyroidism, unspecified: Secondary | ICD-10-CM | POA: Diagnosis not present

## 2021-02-25 DIAGNOSIS — Z950 Presence of cardiac pacemaker: Secondary | ICD-10-CM | POA: Diagnosis not present

## 2021-02-25 DIAGNOSIS — D61818 Other pancytopenia: Secondary | ICD-10-CM | POA: Diagnosis not present

## 2021-02-25 DIAGNOSIS — F200 Paranoid schizophrenia: Secondary | ICD-10-CM | POA: Diagnosis not present

## 2021-02-25 DIAGNOSIS — K7460 Unspecified cirrhosis of liver: Secondary | ICD-10-CM | POA: Diagnosis not present

## 2021-02-25 DIAGNOSIS — K754 Autoimmune hepatitis: Secondary | ICD-10-CM | POA: Diagnosis not present

## 2021-02-25 DIAGNOSIS — Z8679 Personal history of other diseases of the circulatory system: Secondary | ICD-10-CM | POA: Diagnosis not present

## 2021-02-25 DIAGNOSIS — H409 Unspecified glaucoma: Secondary | ICD-10-CM | POA: Diagnosis not present

## 2021-02-25 DIAGNOSIS — R188 Other ascites: Secondary | ICD-10-CM | POA: Diagnosis not present

## 2021-02-25 DIAGNOSIS — F319 Bipolar disorder, unspecified: Secondary | ICD-10-CM | POA: Diagnosis not present

## 2021-02-25 DIAGNOSIS — K219 Gastro-esophageal reflux disease without esophagitis: Secondary | ICD-10-CM | POA: Diagnosis not present

## 2021-02-25 DIAGNOSIS — G40909 Epilepsy, unspecified, not intractable, without status epilepticus: Secondary | ICD-10-CM | POA: Diagnosis not present

## 2021-03-18 ENCOUNTER — Ambulatory Visit: Payer: Medicare Other | Admitting: Family Medicine

## 2021-03-25 DEATH — deceased

## 2021-03-30 ENCOUNTER — Encounter: Payer: Self-pay | Admitting: Internal Medicine

## 2021-05-01 ENCOUNTER — Encounter
